# Patient Record
Sex: Female | Born: 1960 | Race: White | Hispanic: No | Marital: Married | Smoking: Former smoker
Health system: Southern US, Community
[De-identification: ages and names within clinical notes are randomized; demographics above are authoritative.]

## PROBLEM LIST (undated history)

## (undated) DIAGNOSIS — J45909 Unspecified asthma, uncomplicated: Secondary | ICD-10-CM

## (undated) DIAGNOSIS — J189 Pneumonia, unspecified organism: Secondary | ICD-10-CM

## (undated) DIAGNOSIS — I7300 Raynaud's syndrome without gangrene: Secondary | ICD-10-CM

## (undated) DIAGNOSIS — C4490 Unspecified malignant neoplasm of skin, unspecified: Secondary | ICD-10-CM

## (undated) DIAGNOSIS — C50919 Malignant neoplasm of unspecified site of unspecified female breast: Secondary | ICD-10-CM

## (undated) DIAGNOSIS — G629 Polyneuropathy, unspecified: Secondary | ICD-10-CM

## (undated) DIAGNOSIS — F419 Anxiety disorder, unspecified: Secondary | ICD-10-CM

## (undated) DIAGNOSIS — Z8679 Personal history of other diseases of the circulatory system: Secondary | ICD-10-CM

## (undated) DIAGNOSIS — N823 Fistula of vagina to large intestine: Secondary | ICD-10-CM

## (undated) DIAGNOSIS — M8580 Other specified disorders of bone density and structure, unspecified site: Secondary | ICD-10-CM

## (undated) DIAGNOSIS — Z923 Personal history of irradiation: Secondary | ICD-10-CM

## (undated) DIAGNOSIS — T4145XA Adverse effect of unspecified anesthetic, initial encounter: Secondary | ICD-10-CM

## (undated) DIAGNOSIS — Z9289 Personal history of other medical treatment: Secondary | ICD-10-CM

## (undated) DIAGNOSIS — I10 Essential (primary) hypertension: Secondary | ICD-10-CM

## (undated) HISTORY — PX: LAPAROSCOPIC ENDOMETRIOSIS FULGURATION: SUR769

## (undated) HISTORY — DX: Raynaud's syndrome without gangrene: I73.00

## (undated) HISTORY — DX: Personal history of other diseases of the circulatory system: Z86.79

## (undated) HISTORY — DX: Essential (primary) hypertension: I10

## (undated) HISTORY — DX: Unspecified malignant neoplasm of skin, unspecified: C44.90

## (undated) HISTORY — DX: Fistula of vagina to large intestine: N82.3

## (undated) HISTORY — DX: Other specified disorders of bone density and structure, unspecified site: M85.80

---

## 1986-08-27 DIAGNOSIS — Z8679 Personal history of other diseases of the circulatory system: Secondary | ICD-10-CM

## 1986-08-27 HISTORY — DX: Personal history of other diseases of the circulatory system: Z86.79

## 1989-08-27 HISTORY — PX: OTHER SURGICAL HISTORY: SHX169

## 2000-06-25 ENCOUNTER — Encounter: Admission: RE | Admit: 2000-06-25 | Discharge: 2000-06-25 | Payer: Self-pay | Admitting: Obstetrics and Gynecology

## 2000-06-25 ENCOUNTER — Encounter: Payer: Self-pay | Admitting: Obstetrics and Gynecology

## 2003-11-15 ENCOUNTER — Other Ambulatory Visit: Admission: RE | Admit: 2003-11-15 | Discharge: 2003-11-15 | Payer: Self-pay | Admitting: Obstetrics and Gynecology

## 2003-11-17 ENCOUNTER — Encounter: Admission: RE | Admit: 2003-11-17 | Discharge: 2003-11-17 | Payer: Self-pay | Admitting: Obstetrics and Gynecology

## 2005-05-17 ENCOUNTER — Ambulatory Visit (HOSPITAL_COMMUNITY): Admission: RE | Admit: 2005-05-17 | Discharge: 2005-05-17 | Payer: Self-pay | Admitting: Internal Medicine

## 2005-08-07 ENCOUNTER — Encounter: Admission: RE | Admit: 2005-08-07 | Discharge: 2005-08-07 | Payer: Self-pay | Admitting: Gastroenterology

## 2005-08-23 ENCOUNTER — Ambulatory Visit (HOSPITAL_COMMUNITY): Admission: RE | Admit: 2005-08-23 | Discharge: 2005-08-23 | Payer: Self-pay | Admitting: *Deleted

## 2005-08-27 DIAGNOSIS — T8859XA Other complications of anesthesia, initial encounter: Secondary | ICD-10-CM

## 2005-08-27 HISTORY — PX: ABDOMINAL HYSTERECTOMY: SHX81

## 2005-08-27 HISTORY — PX: HERNIA REPAIR: SHX51

## 2005-08-27 HISTORY — DX: Other complications of anesthesia, initial encounter: T88.59XA

## 2005-08-27 HISTORY — PX: APPENDECTOMY: SHX54

## 2006-10-17 ENCOUNTER — Encounter: Admission: RE | Admit: 2006-10-17 | Discharge: 2006-10-17 | Payer: Self-pay | Admitting: Gastroenterology

## 2006-11-06 ENCOUNTER — Ambulatory Visit (HOSPITAL_COMMUNITY): Admission: RE | Admit: 2006-11-06 | Discharge: 2006-11-06 | Payer: Self-pay | Admitting: Internal Medicine

## 2006-11-12 ENCOUNTER — Encounter: Admission: RE | Admit: 2006-11-12 | Discharge: 2006-11-12 | Payer: Self-pay | Admitting: Internal Medicine

## 2006-12-26 ENCOUNTER — Ambulatory Visit (HOSPITAL_BASED_OUTPATIENT_CLINIC_OR_DEPARTMENT_OTHER): Admission: RE | Admit: 2006-12-26 | Discharge: 2006-12-26 | Payer: Self-pay | Admitting: *Deleted

## 2006-12-29 ENCOUNTER — Ambulatory Visit: Payer: Self-pay | Admitting: Internal Medicine

## 2007-12-22 ENCOUNTER — Encounter: Admission: RE | Admit: 2007-12-22 | Discharge: 2007-12-22 | Payer: Self-pay | Admitting: Obstetrics

## 2008-05-20 ENCOUNTER — Encounter: Admission: RE | Admit: 2008-05-20 | Discharge: 2008-05-20 | Payer: Self-pay | Admitting: General Surgery

## 2011-01-12 NOTE — Procedures (Signed)
[name], Alison [name]                ACCOUNT NO.:  [id number]   MEDICAL RECORD NO.:  [id number]          PATIENT TYPE:  OUT   LOCATION:  SLEEP CENTER                 FACILITY:  [facility]   PHYSICIAN:  Clinton D. Maple Hudson, MD, FCCP, FACPDATE OF BIRTH:  June 19, 1960   DATE OF STUDY:  12/26/2006                            NOCTURNAL POLYSOMNOGRAM   REFERRING PHYSICIAN:  TERRY W KERSEY   INDICATION FOR STUDY:  Insomnia with sleep apnea.   EPWORTH SLEEPINESS SCORE:  6/24, height 5 feet 3 inches, weight 166  pounds, neck size 14.5 inches.   MEDICATIONS:  Home medications are listed and reviewed including  sertraline, bupropion XL, dextroamphetamine, Xanax, and trazodone likely  to affect alertness and sleep.   SLEEP ARCHITECTURE:  Total sleep time 319 minutes with sleep efficiency  80%.  Stage I was 3%, stage II 93%, stages III and IV absent, REM 4% of  total sleep time.  Sleep latency 14 minutes, REM latency 256 minutes,  awake after sleep onset 65 minutes.  Arousal index 6.0.  There was  spontaneous waking between 2:30 and 4 a.m., nonspecific.  Bedtime  medication included Xanax and 100 mg of trazodone at 9:45 p.m.   RESPIRATORY DATA:  Apnea/hypopnea index (AHI, RDI) 5.3 obstructive  events per hour indicating mild obstructive sleep apnea/hypopnea  syndrome (normal range 0-5 per hour).  There were 3 obstructive apneas  and 25 hypopneas.  Most events were recorded while sleeping on her back.  There were insufficient events to qualify for CPAP titration by split  protocol on this study night.   OXYGEN DATA:  Mild to moderate intermittent snoring, loud at times while  supine with oxygen desaturation to a nadir of 83'%.  Mean oxygen  saturation through the study was 92% on room air.   CARDIAC DATA:  Sinus rhythm including mild tachycardia to 115 beats per  minute.   MOVEMENT-PARASOMNIA:  Limb jerks were noted but with little effect on  sleep.   IMPRESSIONS-RECOMMENDATIONS:  1. Note bedtime  medications capable of increasing sedation including      Xanax and trazodone.  2. Minimal obstructive sleep apnea/hypopnea syndrome, AHI 5.3 per hour      with most obstructive events recorded while sleeping on her back.      Mild to moderate snoring was noted to be occasionally loud when she      was on her back.  Oxygen desaturation transiently to 83%.  3. Scores in this range are below what is usually considered      appropriate for CPAP therapy.  Weight loss,      encouragement to sleep off back, and treatment for any significant      nasal or upper airway obstruction may be helpful.      Clinton D. Maple Hudson, MD, [facility], FACP  Diplomate, Biomedical engineer of Sleep Medicine  Electronically Signed     CDY/MEDQ  D:  12/29/2006 11:38:03  T:  12/29/2006 17:46:59  Job:  [id number]

## 2012-05-19 ENCOUNTER — Other Ambulatory Visit: Payer: Self-pay | Admitting: Obstetrics

## 2012-05-19 DIAGNOSIS — Z1231 Encounter for screening mammogram for malignant neoplasm of breast: Secondary | ICD-10-CM

## 2012-06-04 ENCOUNTER — Ambulatory Visit
Admission: RE | Admit: 2012-06-04 | Discharge: 2012-06-04 | Disposition: A | Discharge status: Home / Self Care | Payer: PRIVATE HEALTH INSURANCE | Source: Ambulatory Visit | Attending: Obstetrics | Admitting: Obstetrics

## 2012-06-04 DIAGNOSIS — Z1231 Encounter for screening mammogram for malignant neoplasm of breast: Secondary | ICD-10-CM

## 2012-08-27 DIAGNOSIS — C4490 Unspecified malignant neoplasm of skin, unspecified: Secondary | ICD-10-CM

## 2012-08-27 HISTORY — DX: Unspecified malignant neoplasm of skin, unspecified: C44.90

## 2012-08-29 ENCOUNTER — Other Ambulatory Visit: Payer: Self-pay | Admitting: Obstetrics

## 2012-08-29 DIAGNOSIS — R109 Unspecified abdominal pain: Secondary | ICD-10-CM

## 2012-09-02 ENCOUNTER — Ambulatory Visit
Admission: RE | Admit: 2012-09-02 | Discharge: 2012-09-02 | Disposition: A | Discharge status: Home / Self Care | Payer: PRIVATE HEALTH INSURANCE | Source: Ambulatory Visit | Attending: Obstetrics | Admitting: Obstetrics

## 2012-09-02 DIAGNOSIS — R109 Unspecified abdominal pain: Secondary | ICD-10-CM

## 2012-09-02 MED ORDER — IOHEXOL 300 MG/ML  SOLN
100.0000 mL | Freq: Once | INTRAMUSCULAR | Status: AC | PRN
  Administered 2012-09-02: 100 mL via INTRAVENOUS

## 2012-09-24 ENCOUNTER — Ambulatory Visit (INDEPENDENT_AMBULATORY_CARE_PROVIDER_SITE_OTHER): Payer: Self-pay | Admitting: General Surgery

## 2012-12-02 HISTORY — PX: COLON RESECTION SIGMOID: SHX6737

## 2013-07-02 ENCOUNTER — Other Ambulatory Visit: Payer: Self-pay

## 2013-07-02 DIAGNOSIS — Z1231 Encounter for screening mammogram for malignant neoplasm of breast: Secondary | ICD-10-CM

## 2013-07-31 ENCOUNTER — Ambulatory Visit
Admission: RE | Admit: 2013-07-31 | Discharge: 2013-07-31 | Disposition: A | Discharge status: Home / Self Care | Payer: PRIVATE HEALTH INSURANCE | Source: Ambulatory Visit

## 2013-07-31 DIAGNOSIS — Z1231 Encounter for screening mammogram for malignant neoplasm of breast: Secondary | ICD-10-CM

## 2014-10-07 ENCOUNTER — Other Ambulatory Visit: Payer: Self-pay

## 2014-10-07 DIAGNOSIS — Z1231 Encounter for screening mammogram for malignant neoplasm of breast: Secondary | ICD-10-CM

## 2014-10-19 ENCOUNTER — Other Ambulatory Visit: Payer: Self-pay

## 2014-10-19 ENCOUNTER — Ambulatory Visit
Admission: RE | Admit: 2014-10-19 | Discharge: 2014-10-19 | Disposition: A | Discharge status: Home / Self Care | Payer: 59 | Source: Ambulatory Visit

## 2014-10-19 ENCOUNTER — Encounter (INDEPENDENT_AMBULATORY_CARE_PROVIDER_SITE_OTHER): Payer: Self-pay

## 2014-10-19 DIAGNOSIS — Z1231 Encounter for screening mammogram for malignant neoplasm of breast: Secondary | ICD-10-CM

## 2014-10-21 ENCOUNTER — Other Ambulatory Visit: Payer: Self-pay | Admitting: Obstetrics

## 2014-10-21 DIAGNOSIS — R928 Other abnormal and inconclusive findings on diagnostic imaging of breast: Secondary | ICD-10-CM

## 2014-10-29 ENCOUNTER — Other Ambulatory Visit: Payer: 59

## 2014-11-01 ENCOUNTER — Ambulatory Visit
Admission: RE | Admit: 2014-11-01 | Discharge: 2014-11-01 | Disposition: A | Discharge status: Home / Self Care | Payer: 59 | Source: Ambulatory Visit | Attending: Obstetrics | Admitting: Obstetrics

## 2014-11-01 DIAGNOSIS — R928 Other abnormal and inconclusive findings on diagnostic imaging of breast: Secondary | ICD-10-CM

## 2014-12-21 ENCOUNTER — Other Ambulatory Visit: Payer: Self-pay | Admitting: Obstetrics

## 2014-12-21 DIAGNOSIS — R109 Unspecified abdominal pain: Secondary | ICD-10-CM

## 2014-12-24 ENCOUNTER — Ambulatory Visit
Admission: RE | Admit: 2014-12-24 | Discharge: 2014-12-24 | Disposition: A | Discharge status: Home / Self Care | Payer: 59 | Source: Ambulatory Visit | Attending: Obstetrics | Admitting: Obstetrics

## 2014-12-24 DIAGNOSIS — R109 Unspecified abdominal pain: Secondary | ICD-10-CM

## 2014-12-24 MED ORDER — IOPAMIDOL (ISOVUE-300) INJECTION 61%
100.0000 mL | Freq: Once | INTRAVENOUS | Status: AC | PRN
  Administered 2014-12-24: 100 mL via INTRAVENOUS

## 2015-07-28 HISTORY — PX: OTHER SURGICAL HISTORY: SHX169

## 2015-08-01 ENCOUNTER — Other Ambulatory Visit: Payer: Self-pay | Admitting: Obstetrics

## 2015-08-01 ENCOUNTER — Ambulatory Visit
Admission: RE | Admit: 2015-08-01 | Discharge: 2015-08-01 | Disposition: A | Discharge status: Home / Self Care | Payer: 59 | Source: Ambulatory Visit | Attending: Obstetrics | Admitting: Obstetrics

## 2015-08-01 DIAGNOSIS — N6459 Other signs and symptoms in breast: Secondary | ICD-10-CM

## 2015-08-01 DIAGNOSIS — R2232 Localized swelling, mass and lump, left upper limb: Secondary | ICD-10-CM

## 2015-08-01 DIAGNOSIS — N6320 Unspecified lump in the left breast, unspecified quadrant: Secondary | ICD-10-CM

## 2015-08-03 ENCOUNTER — Other Ambulatory Visit: Payer: Self-pay | Admitting: Obstetrics

## 2015-08-03 DIAGNOSIS — N6320 Unspecified lump in the left breast, unspecified quadrant: Secondary | ICD-10-CM

## 2015-08-04 ENCOUNTER — Ambulatory Visit
Admission: RE | Admit: 2015-08-04 | Discharge: 2015-08-04 | Disposition: A | Discharge status: Home / Self Care | Payer: 59 | Source: Ambulatory Visit | Attending: Obstetrics | Admitting: Obstetrics

## 2015-08-04 ENCOUNTER — Other Ambulatory Visit: Payer: Self-pay | Admitting: Obstetrics

## 2015-08-04 DIAGNOSIS — N6320 Unspecified lump in the left breast, unspecified quadrant: Secondary | ICD-10-CM

## 2015-08-04 DIAGNOSIS — R2232 Localized swelling, mass and lump, left upper limb: Secondary | ICD-10-CM

## 2015-08-05 ENCOUNTER — Other Ambulatory Visit: Payer: Self-pay | Admitting: Obstetrics

## 2015-08-05 DIAGNOSIS — N63 Unspecified lump in unspecified breast: Secondary | ICD-10-CM

## 2015-08-08 ENCOUNTER — Telehealth: Payer: Self-pay | Admitting: *Deleted

## 2015-08-08 DIAGNOSIS — C50412 Malignant neoplasm of upper-outer quadrant of left female breast: Secondary | ICD-10-CM

## 2015-08-08 DIAGNOSIS — Z170 Estrogen receptor positive status [ER+]: Secondary | ICD-10-CM | POA: Insufficient documentation

## 2015-08-08 NOTE — Telephone Encounter (Signed)
Confirmed [facility] for 08/10/15 at 830am .  Instructions and contact information given.

## 2015-08-09 ENCOUNTER — Ambulatory Visit
Admission: RE | Admit: 2015-08-09 | Discharge: 2015-08-09 | Disposition: A | Discharge status: Home / Self Care | Payer: 59 | Source: Ambulatory Visit | Attending: Obstetrics | Admitting: Obstetrics

## 2015-08-09 ENCOUNTER — Other Ambulatory Visit: Payer: Self-pay | Admitting: Obstetrics

## 2015-08-09 DIAGNOSIS — N63 Unspecified lump in unspecified breast: Secondary | ICD-10-CM

## 2015-08-10 ENCOUNTER — Encounter: Payer: Self-pay | Admitting: Oncology

## 2015-08-10 ENCOUNTER — Encounter: Payer: Self-pay | Admitting: Physical Therapy

## 2015-08-10 ENCOUNTER — Other Ambulatory Visit (HOSPITAL_BASED_OUTPATIENT_CLINIC_OR_DEPARTMENT_OTHER): Payer: 59

## 2015-08-10 ENCOUNTER — Other Ambulatory Visit (HOSPITAL_COMMUNITY): Payer: Self-pay | Admitting: Surgery

## 2015-08-10 ENCOUNTER — Encounter: Payer: Self-pay | Admitting: Skilled Nursing Facility1

## 2015-08-10 ENCOUNTER — Ambulatory Visit
Admission: RE | Admit: 2015-08-10 | Discharge: 2015-08-10 | Disposition: A | Discharge status: Home / Self Care | Payer: 59 | Source: Ambulatory Visit | Attending: Radiation Oncology | Admitting: Radiation Oncology

## 2015-08-10 ENCOUNTER — Ambulatory Visit: Payer: 59 | Attending: Surgery | Admitting: Physical Therapy

## 2015-08-10 ENCOUNTER — Telehealth: Payer: Self-pay | Admitting: Oncology

## 2015-08-10 ENCOUNTER — Ambulatory Visit (HOSPITAL_BASED_OUTPATIENT_CLINIC_OR_DEPARTMENT_OTHER): Payer: 59 | Admitting: Oncology

## 2015-08-10 ENCOUNTER — Other Ambulatory Visit: Payer: Self-pay | Admitting: *Deleted

## 2015-08-10 VITALS — BP 121/69 | HR 93 | Temp 98.6°F | Resp 18 | Ht 63.0 in | Wt 145.8 lb

## 2015-08-10 DIAGNOSIS — C50412 Malignant neoplasm of upper-outer quadrant of left female breast: Secondary | ICD-10-CM

## 2015-08-10 DIAGNOSIS — C50912 Malignant neoplasm of unspecified site of left female breast: Secondary | ICD-10-CM

## 2015-08-10 DIAGNOSIS — R293 Abnormal posture: Secondary | ICD-10-CM | POA: Diagnosis present

## 2015-08-10 DIAGNOSIS — Z170 Estrogen receptor positive status [ER+]: Secondary | ICD-10-CM

## 2015-08-10 LAB — COMPREHENSIVE METABOLIC PANEL
ALBUMIN: 4.1 g/dL (ref 3.5–5.0)
ALT: 44 U/L (ref 0–55)
AST: 43 U/L — AB (ref 5–34)
Alkaline Phosphatase: 37 U/L — ABNORMAL LOW (ref 40–150)
Anion Gap: 10 mEq/L (ref 3–11)
BUN: 23.1 mg/dL (ref 7.0–26.0)
CHLORIDE: 106 meq/L (ref 98–109)
CO2: 21 mEq/L — ABNORMAL LOW (ref 22–29)
Calcium: 9.8 mg/dL (ref 8.4–10.4)
Creatinine: 0.9 mg/dL (ref 0.6–1.1)
EGFR: 68 mL/min/{1.73_m2} — ABNORMAL LOW (ref >90)
GLUCOSE: 104 mg/dL (ref 70–140)
POTASSIUM: 4.4 meq/L (ref 3.5–5.1)
Sodium: 137 mEq/L (ref 136–145)
Total Bilirubin: 0.35 mg/dL (ref 0.20–1.20)
Total Protein: 7.7 g/dL (ref 6.4–8.3)

## 2015-08-10 LAB — CBC WITH DIFFERENTIAL/PLATELET
BASO%: 0.2 % (ref 0.0–2.0)
Basophils Absolute: 0 10*3/uL (ref 0.0–0.1)
EOS ABS: 0.2 10*3/uL (ref 0.0–0.5)
EOS%: 2.8 % (ref 0.0–7.0)
HEMATOCRIT: 36.7 % (ref 34.8–46.6)
HGB: 12.3 g/dL (ref 11.6–15.9)
LYMPH#: 1.7 10*3/uL (ref 0.9–3.3)
LYMPH%: 28.6 % (ref 14.0–49.7)
MCH: 31.3 pg (ref 25.1–34.0)
MCHC: 33.4 g/dL (ref 31.5–36.0)
MCV: 93.7 fL (ref 79.5–101.0)
MONO#: 0.5 10*3/uL (ref 0.1–0.9)
MONO%: 9.1 % (ref 0.0–14.0)
NEUT%: 59.3 % (ref 38.4–76.8)
NEUTROS ABS: 3.6 10*3/uL (ref 1.5–6.5)
PLATELETS: 340 10*3/uL (ref 145–400)
RBC: 3.92 10*6/uL (ref 3.70–5.45)
RDW: 13.3 % (ref 11.2–14.5)
WBC: 6 10*3/uL (ref 3.9–10.3)

## 2015-08-10 MED ORDER — ALPRAZOLAM 0.5 MG PO TABS: 0.5000 mg | ORAL_TABLET | ORAL | Status: DC | PRN

## 2015-08-10 MED ORDER — GABAPENTIN 300 MG PO CAPS: 300.0000 mg | ORAL_CAPSULE | Freq: Every day | ORAL | Status: DC

## 2015-08-10 NOTE — Progress Notes (Signed)
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  HISTORY OF PRESENT ILLNESS::Alison [name] is a 54 y.o. female who felt thickening and hardness as well as swelling underneath her left arm for two weeks. Mammogram showed increased density of the left breast with ill defined edema and skin thickening. Two discrete masses were seen on ultrasound, the largest measuring 3.2 cm. These were about 1.8 cm apart. Several enlarged left axillary lymph nodes were seen. Biopsy of the breast region showed Grade III invasive ductal carcinoma with DCIS, ER+ PR+ HER-2(-) Ki-67 12%. Her left axillary lymph node is positive. She is currently on estrogen replacement.    Alison [name] is accompanied by her husband today. She noticed the abnormal hardening of her left breast on Saturday morning, Sunday morning she noticed her nipple inverting with pain, and called her gynecologist the following Monday morning. She had not been feeling well before then and was taking naps which is not her normal. She initially believed the swollen lymph node was perhaps from a flu coming on. Her nipple has not inverted any more and the skin in her breast continues to feel abnormal. She has been experiencing intermittent pain in her left arm that began on Saturday, before her biopsies. She notes bruising from her biopsy. She had her lymph node biopsy and reports some weeping/drainage. She previously had basal cell carcinoma in one spot in 2014. Her father had lung cancer. She has no family history of any breast associated cancers. She reports that her right nipple has been sensitive/sore. She also gets a boil in her right axillary region off and on since she turn 54 years old. She was curious if  treatment would be completed by July 8th of 2017 as they have a two week beach trip planned.   PREVIOUS RADIATION THERAPY: No  Past medical, social and family history were reviewed in the electronic chart. Review of symptoms was reviewed in the electronic chart. Medications were reviewed in the electronic chart.   PHYSICAL EXAM:  Vitals with BMI 08/10/2015  Height 5' 3"   Weight 145 lbs 13 oz  BMI 37.3  Systolic 428  Diastolic 69  Pulse 93  Respirations 18   Bilateral supraclavicular adenopathy. She has a palpable cyst in the right axilla with no palpable abnormalities of the right breast. The left breast is firm and hard. It measures about twice the size of the right breast. She has a palpable lymph node which is tender on palpation in the left axilla. She is alert and oriented x3. There is bruising associated with the biopsy site in the upper outer quadrant of the left breast. Nipple inverted on the left breast.   Gynecologic History  Age at first menstrual period? 10 years  Are you still having periods? No Approximate date of last period? N/A  If you are still having periods: Are your periods regular? N/A  If you no longer have periods: Have you used hormone replacement? Yes  If YES, for how long? 2012? When did you stop? Doing step down process now. Obstetric History:  How many children have you carried to term? 1 Your age at first live birth? 65 years  Pregnant now or trying to get pregnant? No  Have you used  birth control pills or hormone shots for contraception? Yes  If so, for how long (or approximate dates)? 1-2 month - 1981  Would you be interested in learning more about the options to preserve fertility? N/A Health Maintenance:  Have you ever had a colonoscopy? Yes If yes, date? April 2014  Have you ever had a bone density? Yes If yes, date? August 2016  Date of your last PAP smear? 2013? Date of your FIRST mammogram? 73?  IMPRESSION: T2 N1 invasive ductal carcinoma of  the left breast.  PLAN: Ms. [name] will proceed with MRI and PET or CT and bone scan for staging and to document the extent of disease in her breasts. She may also proceed with punch biopsy to document skin involvement with Dr. Brantley Stage. I think at this point she will likely require a mastectomy regardless of her response to chemotherapy and because of her positive lymph node would require post mastectomy radiation. We discussed 6 weeks of treatment as an outpatient. We discussed skin redness and fatigue as possible side effects. We will have to see what happens with neo adjuvant chemotherapy and staging studies and go from there. She was seen by surgery and medical oncology, physical therapy, a member of our social work team and survivorship.   I spent 40 minutes  face to face with the patient and more than 50% of that time was spent in counseling and/or coordination of care.   ------------------------------------------------  [name], MD  This document serves as a record of services personally performed by [name], MD. It was created on her behalf by Arlyce Harman, a trained medical scribe. The creation of this record is based on the scribe's personal observations and the provider's statements to them. This document has been checked and approved by the attending provider.

## 2015-08-10 NOTE — Therapy (Signed)
[place], [place], [address] Phone: [phone]   Fax:  [phone]  Physical Therapy Evaluation  Patient Details  Name: Alison [name] MRN: [id number] Date of Birth: [birth year] Referring Provider: Dr. Erroll Luna  Encounter Date: 08/10/2015      PT End of Session - 08/10/15 2102    Visit Number 1   Number of Visits 1   PT Start Time 3151   PT Stop Time 7616  Also saw pt from 0737 - 1128   PT Time Calculation (min) 10 min   Activity Tolerance Patient tolerated treatment well   Behavior During Therapy [facility] for tasks assessed/performed      Past Medical History  Diagnosis Date  . Hypertension   . Raynaud's disease   . Skin cancer     Past Surgical History  Procedure Laterality Date  . C-section    . Abdominal hysterectomy    . Appendectomy    . Colon resection    . Hernia repair      There were no vitals filed for this visit.  Visit Diagnosis:  Carcinoma of upper-outer quadrant of left female breast [facility]) - Plan: PT plan of care cert/re-cert  Abnormal posture - Plan: PT plan of care cert/re-cert      Subjective Assessment - 08/10/15 2054    Subjective Patient was seen today for a baseline assessment of her newly diagnosed left breast cancer.   Patient is accompained by: Family member   Pertinent History Patient was diagnosed 08/05/15 with left invasive breast cancer.  It is ER/PR positive and HER2 negative.  There are 2 masses measuring 2.3 and 1 cm located at 2:00 and 1:00 respectively.  An axillary node was biopsied and is positive.   Patient Stated Goals Reduce lymphedema risk and learn post op shoulder ROM HEP   Currently in Pain? No/denies            [facility] PT Assessment - 08/10/15 0001    Assessment   Medical Diagnosis left breast cancer   Referring Provider Dr. Marcello Moores [place]   Onset Date/Surgical Date 08/05/15   Hand Dominance Right   Prior Therapy none   Precautions   Precautions Other (comment)  Active breast cancer   Restrictions   Weight Bearing Restrictions No   Balance Screen   Has the patient fallen in the past 6 months No   Has the patient had a decrease in activity level because of a fear of falling?  No   Is the patient reluctant to leave their home because of a fear of falling?  No   Home Ecologist residence   Living Arrangements Spouse/significant other   Available Help at Discharge Family   Prior Function   Level of [place] Retired   Biomedical scientist Retired Electrical engineer   Leisure She does not exercise   Cognition   Overall Cognitive Status Within Functional Limits for tasks assessed   Posture/Postural Control   Posture/Postural Control Postural limitations   Postural Limitations Rounded Shoulders;Forward head   ROM / Strength   AROM / PROM / Strength AROM;Strength   AROM   AROM Assessment Site Shoulder   Right/Left Shoulder Right;Left   Right Shoulder Extension 34 Degrees   Right Shoulder Flexion 156 Degrees   Right Shoulder ABduction 170 Degrees   Right Shoulder Internal Rotation 68 Degrees   Right Shoulder External Rotation 90 Degrees   Left Shoulder Extension  46 Degrees   Left Shoulder Flexion 156 Degrees   Left Shoulder ABduction 166 Degrees   Left Shoulder Internal Rotation 76 Degrees   Left Shoulder External Rotation 90 Degrees   Strength   Overall Strength Within functional limits for tasks performed           LYMPHEDEMA/ONCOLOGY QUESTIONNAIRE - 08/10/15 2100    Type   Cancer Type Left breast cancer   Lymphedema Assessments   Lymphedema Assessments Upper extremities   Right Upper Extremity Lymphedema   10 cm Proximal to Olecranon Process 25 cm   Olecranon Process 23 cm   10 cm Proximal to Ulnar Styloid Process 22.3 cm   Just Proximal to Ulnar Styloid Process 15 cm   Across Hand at [employer] 18.5 cm   At [place] of 2nd  Digit 5.7 cm   Left Upper Extremity Lymphedema   10 cm Proximal to Olecranon Process 25.3 cm   Olecranon Process 22.7 cm   10 cm Proximal to Ulnar Styloid Process 20.9 cm   Just Proximal to Ulnar Styloid Process 14.9 cm   Across Hand at [employer] 17.9 cm   At [place] of 2nd Digit 5.5 cm      Patient was instructed today in a home exercise program today for post op shoulder range of motion. These included active assist shoulder flexion in sitting, scapular retraction, wall walking with shoulder abduction, and hands behind head external rotation.  She was encouraged to do these twice a day, holding 3 seconds and repeating 5 times when permitted by her physician.         PT Education - 08/10/15 2101    Education provided Yes   Education Details Post op shoulder ROM HEP and lymphedema risk reduction   Person(s) Educated Patient;Spouse   Methods Demonstration;Explanation;Handout   Comprehension Returned demonstration;Verbalized understanding              Breast Clinic Goals - 08/10/15 2105    Patient will be able to verbalize understanding of pertinent lymphedema risk reduction practices relevant to her diagnosis specifically related to skin care.   Time 1   Period Days   Status Achieved   Patient will be able to return demonstrate and/or verbalize understanding of the post-op home exercise program related to regaining shoulder range of motion.   Time 1   Period Days   Status Achieved   Patient will be able to verbalize understanding of the importance of attending the postoperative After Breast Cancer Class for further lymphedema risk reduction education and therapeutic exercise.   Time 1   Period Days   Status Achieved              Plan - 08/10/15 2103    Clinical Impression Statement Patient was diagnosed 08/05/15 with left invasive breast cancer.  It is ER/PR positive and HER2 negative.  There are 2 masses measuring 2.3 and 1 cm located at 2:00 and 1:00  respectively.  An axillary node was biopsied and is positive.  She is planning to have neoadjuvant chemotherapy, a left lumpectomy with either a sentinel node biopsy or axillary node dissection followed by radiation.  She may benefit from post op PT to regain shoulder ROM and strength and to reduce lymphedema risk.   Pt will benefit from skilled therapeutic intervention in order to improve on the following deficits Decreased strength;Decreased knowledge of precautions;Pain;Impaired UE functional use;Decreased range of motion   Rehab Potential Excellent   Clinical Impairments Affecting Rehab  Potential none   PT Frequency One time visit   PT Treatment/Interventions Therapeutic exercise;Patient/family education   PT Next Visit Plan No PT indicated until after surgery   Consulted and Agree with Plan of Care Patient;Family member/caregiver   Family Member Consulted Husband     Patient will follow up at outpatient cancer rehab if needed following surgery.  If the patient requires physical therapy at that time, a specific plan will be dictated and sent to the referring physician for approval. The patient was educated today on appropriate basic range of motion exercises to begin post operatively and the importance of attending the After Breast Cancer class following surgery.  Patient was educated today on lymphedema risk reduction practices as it pertains to recommendations that will benefit the patient immediately following surgery.  She verbalized good understanding.  No additional physical therapy is indicated at this time.       Problem List Patient Active Problem List   Diagnosis Date Noted  . Breast cancer of upper-outer quadrant of left female breast ([place]) 08/08/2015    Annia Friendly, PT 08/10/2015 9:07 PM  [place], [place], [address] Phone: [phone]   Fax:  [phone]  Name: Alison [name] MRN:  [id number] Date of Birth: 1960/09/27

## 2015-08-10 NOTE — Patient Instructions (Signed)

## 2015-08-10 NOTE — Progress Notes (Signed)
Subjective:     Patient ID: Alison [name], female   DOB: 28-Dec-1960, 54 y.o.   MRN: [id number]  HPI   Review of Systems     Objective:   Physical Exam For the patient to understand and be given the tools to implement a healthy plant based diet during their cancer diagnosis. pts ht 5'3'', 145 pounds, and BMI 25.9     Assessment:     Patient was seen today and found to be in good spirits and accompanied by her husband. Pt asked a lot of really great questions. Pts ht 5'3'', BMI 25.9, wt 145. Pt is taking a probiotic capsule. PTs taking: vitamin E, C,D, calcium, and a mutlivitamin. Pt has had a colon resection. Pts labs: AST 43, GFR 68, CO2 21. Pt states she is always up and doing something.     Plan:     Dietitian educated the patient on implementing a plant based diet by incorporating more plant proteins, fruits, and vegetables. As a part of a healthy routine physical activity was discussed. Dietitian advised she focus on a bowel healthy diet instead of miralax. The importance of legitimate, evidence based information was discussed and examples were given. A folder of evidence based information with a focus on a plant based diet and general nutrition during cancer was given to the patient.  As a part of the continuum of care the cancer dietitian's contact information was given to the patient in the event they would like to have a follow up appointment.

## 2015-08-10 NOTE — Telephone Encounter (Signed)
GAVE PATIENT AVS REPORT AND APPOINTMENTS FOR December AND January INCLUDING ECHO. BONE/CT SCAN AND MRI ALREADY ON SCHEDULE. PER REPLY FROM [facility] NO PREAUTH NEEDED FOR ECHO.

## 2015-08-10 NOTE — Progress Notes (Signed)
[place]  [phone] [phone]     ID: Alison [name] DOB: 1960/09/17  MR#: [id number]  KGY#:[id number]  Patient Care Team: [name], MD as PCP - General (Internal Medicine) Erroll Luna, MD as Consulting Physician (General Surgery) Chauncey Cruel, MD as Consulting Physician (Oncology) [name], MD as Consulting Physician (Radiation Oncology) Aloha Gell, MD as Consulting Physician (Obstetrics and Gynecology) Harriett Sine, MD as Consulting Physician (Dermatology) PCP: [name], MD OTHER MD:  CHIEF COMPLAINT: Estrogen receptor positive breast cancer  CURRENT TREATMENT: Neoadjuvant chemotherapy   BREAST CANCER HISTORY: Alison [name] had routine screening mammography [facility] February 20 11/14/2014 showing a possible mass in the right breast. Diagnostic right mammography with right breast ultrasonography 11/01/2014 found the breast density to be category C. In the upper outer quadrant of the right breast there was a partially obscured mass which on physical exam was palpable as an area of thickening at the 10:00 position 8 cm from the nipple. Ultrasound showed multiple cysts in the upper outer right breast corresponding to the mass in question.  In November 2016 the patient felt a change in the upper left breast and brought it to her primary care physician's attention. Diagnostic left mammography with tomosynthesis and left breast ultrasonography at [facility] December 01/14/2015 found trabecular thickening throughout the left breast with skin thickening, but no circumscribed mass or suspicious calcifications. A rounded mass in the left axilla measured 6 mm. There was an additional mildly prominent lymph node in the left axilla which was what the patient had been palpating. Ultrasonography showed ill-defined swelling throughout the inner left breast with overlying skin thickening.. At the 2:00  position 4 cm from the nipple there was an irregular hypoechoic mass measuring 2.3 cm. A second mass at the 1:00 area 3 cm from the nipple measured 1 cm. The distance between these 2 masses was 1.8 cm. There was also a round hypoechoic left axillary mass measuring 0.6 cm. Overall the was an area of 3.7 cm of mixed echogenicity corresponding to the area of palpable soft tissue swelling.   On 08/04/2015 the patient underwent biopsy of the mass at the 2:00 axis 4 cm from the nipple and a second biopsy was performed of the hypoechoic mass at the 1:00 position 3 cm from the nipple. Biopsy of the suspicious nodule in the lower left axilla was attempted but could not be completed as the mass became obscured after administration of lidocaine.  The pathology from both left breast biopsies 08/04/2015 ([id number]) showed invasive ductal carcinoma, grade 3. The prognostic profile obtained from the larger tumor showed estrogen receptor to be strongly positive at 95%, progesterone receptor positive at 30%, with an MIB-1 of 12%, and no HER-2 amplification, the signals ratio being 1.22 and the number per cell 2.20.  On 08/09/2015 the patient underwent biopsy of this suspicious left axillary lymph node noted above, and this showed ([id number]) metastatic carcinoma with extracapsular extension.  The patient's subsequent history is as detailed below  INTERVAL HISTORY: Alison [name] was evaluated in the multidisciplinary breast cancer clinic 08/10/2015 accompanied by her husband Alison [name].  Her case was also presented in the multidisciplinary breast cancer conference that same morning. At that time a preliminary plan was proposed: Neoadjuvant chemotherapy followed if possible by breast conserving therapy, and participation in the Alliance trial, with adjuvant radiation followed by antiestrogen therapy. It was felt also the patient's tumor warranted full staging studies which have  been ordered.   REVIEW OF SYSTEMS:  Alison [name] has a  history of diverticular disease and is status post rectovaginal fistula repair in 2014. A CT scan of the abdomen 12/24/2014 found some thickening at the region of the rectosigmoid anastomosis but no evidence of recurrence of the fistula. The patient complains of chills, insomnia, fatigue, pain currently in her left breast, status post recent biopsies, history of ringing in her years, and irregular heartbeat, Raynaud phenomenon, history of basal cell carcinoma in 2014, history of headaches dating back to the summer of 2016, anxiety, and occasional "swollen glands" in her neck. There have been no visual changes, nausea, vomiting, dizziness, or gait imbalance. There has been no cough, phlegm production, or pleurisy. Has been no change in bowel or bladder habits. A detailed review of systems today was otherwise noncontributory   PAST MEDICAL HISTORY: Past Medical History  Diagnosis Date  . Hypertension   . Raynaud's disease   . Skin cancer   . Rectovaginal fistula     repaired 2014  . Osteopenia determined by x-ray     PAST SURGICAL HISTORY: Past Surgical History  Procedure Laterality Date  . C-section    . Abdominal hysterectomy      with BSO  . Appendectomy    . Colon resection    . Hernia repair      FAMILY HISTORY Family History  Problem Relation Age of Onset  . Lung cancer Father    the patient's father died from lung cancer at the age of 43 in the setting of tobacco abuse. The patient's mother died at the age of 55 with pancreatic cancer. The patient had no siblings. There is no history of breast or ovarian cancer in the family.  GYNECOLOGIC HISTORY:  No LMP recorded. Patient has had a hysterectomy. Menarche age 89, first live birth age 47. The patient is GX P1. She is status post total hysterectomy with bilateral salpingo-oophorectomy. She has been on hormone replacement since that time and is tapering to off at the time of this dictation.   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 She used to work at [employer]. Her husband Alison [name] works for [employer] division at a [employer] their son [name] is currently at home.    ADVANCED DIRECTIVES: Not in place   HEALTH MAINTENANCE: Social History  Substance Use Topics  . Smoking status: Former Smoker -- 1.00 packs/day    Types: Cigarettes    Quit date: 11/25/2008  . Smokeless tobacco: None  . Alcohol Use: No     Colonoscopy: April 2014/ Raynaldo Opitz at [facility]  PAP: s/p hysterectomy  Bone density: August 2016/ osteopenia  Lipid panel:  Allergies  Allergen Reactions  . Other Other (See Comments)    Other Reaction: Blisters in mouth    Current Outpatient Prescriptions  Medication Sig Dispense Refill  . Cholecalciferol (VITAMIN D3) 2000 UNITS capsule Take by mouth.    . dextroamphetamine (DEXEDRINE SPANSULE) 15 MG 24 hr capsule Take by mouth.    . fenofibrate (TRICOR) 145 MG tablet Take by mouth.    Marland Kitchen ibuprofen (ADVIL,MOTRIN) 200 MG tablet Take by mouth.    . Multiple Vitamin (MULTIVITAMIN) tablet Take 1 tablet by mouth daily.    Marland Kitchen NIFEdipine (PROCARDIA-XL/ADALAT-CC/NIFEDICAL-XL) 30 MG 24 hr tablet Take by mouth.    . olmesartan (BENICAR) 20 MG tablet Take by mouth.    . Probiotic Product (PROBIOTIC DAILY PO) Take by mouth.    . ALPRAZolam Duanne Moron)  0.5 MG tablet Take 1 tablet (0.5 mg total) by mouth as needed for anxiety (take 1 tab 1 hour prior to MRI. Take 1 tab prior to start of MRI). 3 tablet 0  . gabapentin (NEURONTIN) 300 MG capsule Take 1 capsule (300 mg total) by mouth at bedtime. 90 capsule 4   No current facility-administered medications for this visit.    OBJECTIVE: Middle-aged white woman who appears stated age  54 Vitals:   08/10/15 0924  BP: 121/69  Pulse: 93  Temp: 98.6 F (37 C)  Resp: 18     Body mass index is 25.83 kg/(m^2).    ECOG FS:1 - Symptomatic but completely ambulatory  Ocular: Sclerae unicteric, pupils equal, round and reactive to  light Ear-nose-throat: Oropharynx clear and moist Lymphatic: No cervical or supraclavicular adenopathy Lungs no rales or rhonchi, good excursion bilaterally Heart regular rate and rhythm, no murmur appreciated Abd soft, nontender, positive bowel sounds MSK no focal spinal tenderness, no joint edema Neuro: non-focal, well-oriented, appropriate affect Breasts: The right breast shows the irregular mass in the right axillary area which by prior ultrasonography is known to be a cluster of cysts.. The left breast is status post recent biopsy. There is no erythema. The whole breast is much firmer than the right, however, and slightly larger as well. There is minimal nipple inversion. There is a palpable mass in the left axilla which is movable and minimally tender, status post recent biopsy as well.   LAB RESULTS:  CMP     Component Value Date/Time   NA 137 08/10/2015 0854   K 4.4 08/10/2015 0854   CO2 21* 08/10/2015 0854   GLUCOSE 104 08/10/2015 0854   BUN 23.1 08/10/2015 0854   CREATININE 0.9 08/10/2015 0854   CALCIUM 9.8 08/10/2015 0854   PROT 7.7 08/10/2015 0854   ALBUMIN 4.1 08/10/2015 0854   AST 43* 08/10/2015 0854   ALT 44 08/10/2015 0854   ALKPHOS 37* 08/10/2015 0854   BILITOT 0.35 08/10/2015 0854    INo results found for: SPEP, UPEP  Lab Results  Component Value Date   WBC 6.0 08/10/2015   NEUTROABS 3.6 08/10/2015   HGB 12.3 08/10/2015   HCT 36.7 08/10/2015   MCV 93.7 08/10/2015   PLT 340 08/10/2015      Chemistry      Component Value Date/Time   NA 137 08/10/2015 0854   K 4.4 08/10/2015 0854   CO2 21* 08/10/2015 0854   BUN 23.1 08/10/2015 0854   CREATININE 0.9 08/10/2015 0854      Component Value Date/Time   CALCIUM 9.8 08/10/2015 0854   ALKPHOS 37* 08/10/2015 0854   AST 43* 08/10/2015 0854   ALT 44 08/10/2015 0854   BILITOT 0.35 08/10/2015 0854       No results found for: LABCA2  No components found for: [id number]  No results for input(s): INR in  the last 168 hours.  Urinalysis No results found for: COLORURINE, APPEARANCEUR, LABSPEC, PHURINE, GLUCOSEU, HGBUR, BILIRUBINUR, KETONESUR, PROTEINUR, UROBILINOGEN, NITRITE, LEUKOCYTESUR  STUDIES: Ct Chest W Contrast  08/12/2015  CLINICAL DATA:  55 year old female with newly diagnosed left-sided breast cancer on 08/10/2015. EXAM: CT CHEST WITH CONTRAST TECHNIQUE: Multidetector CT imaging of the chest was performed during intravenous contrast administration. CONTRAST:  10m OMNIPAQUE IOHEXOL 300 MG/ML  SOLN COMPARISON:  Chest CT 11/12/2006. FINDINGS: Mediastinum/Lymph Nodes: Heart size is normal. There is no significant pericardial fluid, thickening or pericardial calcification. There is atherosclerosis of the thoracic aorta, the great vessels of  the mediastinum and the coronary arteries, including calcified atherosclerotic plaque in the distal left main and proximal left anterior descending coronary arteries. No pathologically enlarged mediastinal, internal mammary or hilar lymph nodes. Esophagus is unremarkable in appearance. No axillary lymphadenopathy. Lungs/Pleura: No suspicious appearing pulmonary nodules or masses. No acute consolidative airspace disease. No pleural effusions. Upper Abdomen: Bilateral adrenal glands are visualized and are normal in appearance. Other structures are unremarkable. Musculoskeletal/Soft Tissues: Asymmetric skin thickening in the left breast. No discrete left breast mass is confidently identified on today's CT examination. Chronic compression fracture of L1 with approximately 15% loss of anterior vertebral body height, unchanged compared to 2008. There are no aggressive appearing lytic or blastic lesions noted in the visualized portions of the skeleton. IMPRESSION: 1. Asymmetric skin thickening in the left breast. No discrete breast mass or left axillary adenopathy is identified on today's CT examination. 2. No findings to suggest metastatic disease in the thorax. 3.  Atherosclerosis, including left main and left anterior descending coronary artery disease. Please note that although the presence of coronary artery calcium documents the presence of coronary artery disease, the severity of this disease and any potential stenosis cannot be assessed on this non-gated CT examination. Assessment for potential risk factor modification, dietary therapy or pharmacologic therapy may be warranted, if clinically indicated. Electronically Signed   By: Vinnie Langton M.D.   On: 08/12/2015 09:32   Mm Digital Diagnostic Unilat L  08/04/2015  CLINICAL DATA:  Status post ultrasound-guided biopsy of 2 suspicious masses in the left breast. EXAM: DIAGNOSTIC LEFT MAMMOGRAM POST ULTRASOUND BIOPSY COMPARISON:  Previous exam(s). FINDINGS: Mammographic images were obtained following ultrasound guided biopsy of suspicious masses at the 1 o'clock axis and 2 o'clock axis of the left breast. At the conclusion of each biopsy, tissue markers were placed at each biopsy site. Both of these clips appeared well-positioned by ultrasound and are adequately positioned on this postprocedure mammogram. IMPRESSION: Postprocedure mammogram for clip placement. Two biopsy clips are well-positioned at their respective biopsy sites. Final Assessment: Post Procedure Mammograms for Marker Placement Electronically Signed   By: Franki Cabot M.D.   On: 08/04/2015 16:13   US Breast Ltd Uni Left Inc Axilla  08/01/2015  CLINICAL DATA:  Patient describes thickening/hardness within the upper left breast, noticed only recently. Patient also describes swelling in the left axilla. Ordering physician describes left breast skin thickening and palpable mass in the upper outer quadrant. EXAM: DIGITAL DIAGNOSTIC LEFT MAMMOGRAM WITH 3D TOMOSYNTHESIS WITH CAD ULTRASOUND LEFT BREAST COMPARISON:  Previous exams including most recent screening mammogram of 10/19/2014 ACR Breast Density Category c: The breast tissue is heterogeneously  dense, which may obscure small masses. FINDINGS: Left breast CC and MLO projections were obtained with 3D tomosynthesis. Additional spot compression tangential views of the left axilla, corresponding to the area of soft tissue swelling, was obtained with overlying skin marker in place. There is trabecular thickening throughout the left breast and skin thickening. No circumscribed mass or suspicious calcifications identified within the left breast. A round mass within the left axilla measures 6 mm greatest dimension. There is an additional mildly prominent lymph node within the left axilla with surrounding trabecular thickening/edema, with overlying skin marker demonstrating this to be the site of patient's swelling. Mammographic images were processed with CAD. Targeted ultrasound is performed, showing ill-defined edema throughout the inner left breast and overlying skin thickening. There is also vague shadowing throughout the upper left breast. There is an irregular hypoechoic area at the 2 o'clock  axis, 4 cm from the nipple, with internal vascularity, measuring 2.3 x 1.4 x 0.8 cm. There is an additional hypoechoic mass within the left breast at the 1 o'clock axis, 3 cm from the nipple, with indistinct margins and anti-parallel orientation, measuring 1 x 0.8 x 1 cm. There is a round hypoechoic mass within the left axilla, measuring 0.6 x 0.4 x 0.5 cm. There is an additional morphologically normal lymph node within the left axilla, however, the surrounding soft tissues are heterogeneous (mixed-echogenicity), measuring 3.7 x 1.2 x 3.2 cm, corresponding to the area of palpable soft tissue swelling and mammographic finding. IMPRESSION: 1. Findings highly suggestive of either mastitis or inflammatory breast cancer, favor inflammatory breast cancer, with ill-defined edema throughout a significant portion of the left breast and overlying skin thickening. 2. Suspicious irregular hypoechoic area at the 2 o'clock axis, 4 cm  from the nipple, measuring 2.3 x 1.4 x 0.8 cm, for which ultrasound-guided core biopsy is recommended. 3. Suspicious round hypoechoic mass within the left axilla, measuring 0.6 x 0.4 cm, corresponding to the mammographic finding, most likely an abnormal lymph node, for which ultrasound-guided core biopsy is also recommended. 4. Additional hypoechoic mass within the left breast at the 1 o'clock axis, 3 cm from the nipple, with indistinct margins and anti-parallel orientation, measuring 1 x 0.8 x 1 cm. This is also a suspicious finding suggesting multifocal disease. 5. There is an additional morphologically normal lymph node within the left axilla, however, the surrounding soft tissues are heterogeneous (mixed-echogenicity), measuring 3.7 x 1.2 x 3.2 cm, corresponding to the area of palpable soft tissue swelling and mammographic finding, most likely related to soft tissue edema although infiltrative neoplastic process cannot be excluded. RECOMMENDATION: 1. Ultrasound-guided core biopsy of the suspicious irregular hypoechoic area at the 2 o'clock axis, 4 cm from the nipple, measuring 2.3 x 1.4 x 0.8 cm. 2. Ultrasound-guided core biopsy of the suspicious round hypoechoic mass within the left axilla, measuring 0.6 x 0.4 cm, suspected to be an abnormal lymph node. Ultrasound-guided biopsies are scheduled for Thursday, 08/04/2015. If ultrasound-guided biopsies are negative, a skin punch biopsy will likely be needed. If ultrasound-guided biopsies are positive, breast MRI will likely be needed given the high likelihood of multicentric disease. Findings and considerations were discussed with patient's nurse, Alison [name], on 08/01/2015 at 4:50 p.m. I have discussed the findings and recommendations with the patient. Results were also provided in writing at the conclusion of the visit. If applicable, a reminder letter will be sent to the patient regarding the next appointment. BI-RADS CATEGORY  4: Suspicious. Electronically Signed    By: Franki Cabot M.D.   On: 08/01/2015 17:18   Mm Diag Breast Tomo Uni Left  08/01/2015  CLINICAL DATA:  Patient describes thickening/hardness within the upper left breast, noticed only recently. Patient also describes swelling in the left axilla. Ordering physician describes left breast skin thickening and palpable mass in the upper outer quadrant. EXAM: DIGITAL DIAGNOSTIC LEFT MAMMOGRAM WITH 3D TOMOSYNTHESIS WITH CAD ULTRASOUND LEFT BREAST COMPARISON:  Previous exams including most recent screening mammogram of 10/19/2014 ACR Breast Density Category c: The breast tissue is heterogeneously dense, which may obscure small masses. FINDINGS: Left breast CC and MLO projections were obtained with 3D tomosynthesis. Additional spot compression tangential views of the left axilla, corresponding to the area of soft tissue swelling, was obtained with overlying skin marker in place. There is trabecular thickening throughout the left breast and skin thickening. No circumscribed mass or suspicious calcifications identified within  the left breast. A round mass within the left axilla measures 6 mm greatest dimension. There is an additional mildly prominent lymph node within the left axilla with surrounding trabecular thickening/edema, with overlying skin marker demonstrating this to be the site of patient's swelling. Mammographic images were processed with CAD. Targeted ultrasound is performed, showing ill-defined edema throughout the inner left breast and overlying skin thickening. There is also vague shadowing throughout the upper left breast. There is an irregular hypoechoic area at the 2 o'clock axis, 4 cm from the nipple, with internal vascularity, measuring 2.3 x 1.4 x 0.8 cm. There is an additional hypoechoic mass within the left breast at the 1 o'clock axis, 3 cm from the nipple, with indistinct margins and anti-parallel orientation, measuring 1 x 0.8 x 1 cm. There is a round hypoechoic mass within the left axilla,  measuring 0.6 x 0.4 x 0.5 cm. There is an additional morphologically normal lymph node within the left axilla, however, the surrounding soft tissues are heterogeneous (mixed-echogenicity), measuring 3.7 x 1.2 x 3.2 cm, corresponding to the area of palpable soft tissue swelling and mammographic finding. IMPRESSION: 1. Findings highly suggestive of either mastitis or inflammatory breast cancer, favor inflammatory breast cancer, with ill-defined edema throughout a significant portion of the left breast and overlying skin thickening. 2. Suspicious irregular hypoechoic area at the 2 o'clock axis, 4 cm from the nipple, measuring 2.3 x 1.4 x 0.8 cm, for which ultrasound-guided core biopsy is recommended. 3. Suspicious round hypoechoic mass within the left axilla, measuring 0.6 x 0.4 cm, corresponding to the mammographic finding, most likely an abnormal lymph node, for which ultrasound-guided core biopsy is also recommended. 4. Additional hypoechoic mass within the left breast at the 1 o'clock axis, 3 cm from the nipple, with indistinct margins and anti-parallel orientation, measuring 1 x 0.8 x 1 cm. This is also a suspicious finding suggesting multifocal disease. 5. There is an additional morphologically normal lymph node within the left axilla, however, the surrounding soft tissues are heterogeneous (mixed-echogenicity), measuring 3.7 x 1.2 x 3.2 cm, corresponding to the area of palpable soft tissue swelling and mammographic finding, most likely related to soft tissue edema although infiltrative neoplastic process cannot be excluded. RECOMMENDATION: 1. Ultrasound-guided core biopsy of the suspicious irregular hypoechoic area at the 2 o'clock axis, 4 cm from the nipple, measuring 2.3 x 1.4 x 0.8 cm. 2. Ultrasound-guided core biopsy of the suspicious round hypoechoic mass within the left axilla, measuring 0.6 x 0.4 cm, suspected to be an abnormal lymph node. Ultrasound-guided biopsies are scheduled for Thursday, 08/04/2015.  If ultrasound-guided biopsies are negative, a skin punch biopsy will likely be needed. If ultrasound-guided biopsies are positive, breast MRI will likely be needed given the high likelihood of multicentric disease. Findings and considerations were discussed with patient's nurse, Alison [name], on 08/01/2015 at 4:50 p.m. I have discussed the findings and recommendations with the patient. Results were also provided in writing at the conclusion of the visit. If applicable, a reminder letter will be sent to the patient regarding the next appointment. BI-RADS CATEGORY  4: Suspicious. Electronically Signed   By: Franki Cabot M.D.   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 This was found to be concordant by Dr. Everlean Alstrom. Pathology was called to Bary Castilla, RN, Nurse Navigator at [facility]. The  patient is being seen today at [facility]. Dr. Erroll Luna will notify the patient of her results. Pathology results reported by Susa Raring RN, BSN on August 10, 2015. Electronically Signed   By: Everlean Alstrom M.D.   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 EXAM: ULTRASOUND GUIDED CORE NEEDLE BIOPSY OF A LEFT AXILLARY NODE COMPARISON:  Previous exam(s). FINDINGS: I met with the patient and we discussed the procedure of ultrasound-guided biopsy, including benefits and alternatives. We discussed the high likelihood of a successful procedure. We discussed the risks of the procedure, including infection, bleeding, tissue injury, clip migration, and inadequate sampling. Informed  written consent was given. The usual time-out protocol was performed immediately prior to the procedure. Using sterile technique and 2% Lidocaine as local anesthetic, under direct ultrasound visualization, a 12 gauge spring-loaded device was used to perform biopsy of the round lymph node in the left axilla using a inferior to superior approach. At the conclusion of the procedure a HydroMARK tissue marker clip was deployed into the biopsy cavity, and resides adjacent to the biopsied lymph node. Follow up 2 view mammogram was performed and dictated separately. IMPRESSION: Ultrasound guided biopsy of an indeterminate round left axillary lymph node, with a HydroMARK tissue marker clip placed adjacent to the biopsied lymph node. Electronically Signed: By: Everlean Alstrom M.D. 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 This was found to be concordant by Dr. Franki Cabot. Pathology was discussed with the patient by telephone. She reported minimal bleeding from the sites on the evening of the biopsy, but has had no subsequent problems. Post biopsy instructions and care were reviewed and her questions were answered. She has a biopsy scheduled of a left axillary lymph node on August 09, 2015 and is aware of the appointment. The patient has been scheduled at [facility] on August 10, 2015. Bilateral breast MRI would be recommended to evaluate for multicentric disease, as patient has additional thickening and edema throughout the medial right breast. She was encouraged to come to [facility] for educational materials. My number was provided for additional questions and concerns. Pathology results reported by Susa Raring RN, BSN on  August 05, 2015. Electronically Signed   By: Franki Cabot M.D.   On: 08/05/2015 13:21  08/05/2015  CLINICAL DATA:  Patient with 2 masses in the upper left breast returns today for ultrasound-guided biopsies. Patient with additional suspicious nodule in the lower left axilla for ultrasound-guided biopsy. EXAM: ULTRASOUND GUIDED LEFT BREAST CORE NEEDLE BIOPSY COMPARISON:  Previous exam(s). PROCEDURE: I met with the patient and we discussed the procedure of ultrasound-guided biopsy, including benefits and alternatives. We discussed the high likelihood of a successful procedure. We discussed the risks of the procedure including infection, bleeding, tissue injury, clip migration, and inadequate sampling. Informed written consent was given. The usual time-out protocol was performed immediately prior to the procedure. Using sterile technique and 1% Lidocaine as local anesthetic, under direct ultrasound visualization, a 12 gauge spring-loaded  biopsy device was used to perform biopsy of the irregular hypoechoic mass at the 2 o'clock axis, 4 cm from the nipple,using a lateral approach. At the conclusion of the procedure, a ribbon shaped tissue marker clip was deployed into the biopsy cavity. Next, using sterile technique and 1% lidocaine as local anesthetic, under direct ultrasound visualization, a 12 gauge spring-loaded biopsy device was used to perform biopsy of the oval hypoechoic mass with anti-parallel orientation and indistinct margins located in the left breast at the 1 o'clock axis, 3 cm from the nipple, using a lateral approach. Today, this mass appeared more cystic but did not completely collapse during the biopsy. At the conclusion of the procedure, a coil shaped tissue marker clip was deployed into the biopsy site. Next, an attempt was made to biopsy the suspicious nodule in the lower left axilla. After administration of lidocaine, the nodule became completely obscured so biopsy was not possible. Follow-up  2-view mammogram was performed and dictated separately. IMPRESSION: Ultrasound-guided biopsy of the irregular hypoechoic mass at the 2 o'clock axis, 4 cm from the nipple. Ultrasound-guided biopsy of the cystic-appearing mass at the 1 o'clock axis, 3 cm from the nipple. No apparent complications. Electronically Signed: By: Franki Cabot M.D. 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 This was found to be concordant by Dr. Franki Cabot. Pathology was discussed with the patient by telephone. She reported minimal bleeding from the sites on the evening of the biopsy, but has had no subsequent problems. Post biopsy instructions and care were reviewed and her questions were answered. She has a biopsy scheduled of a left axillary lymph node on August 09, 2015 and is aware of the appointment. The patient has been scheduled at [facility] on August 10, 2015. Bilateral breast MRI would be recommended to evaluate for multicentric disease, as patient has additional thickening and edema throughout the medial right breast. She was encouraged to come to [facility] for educational materials. My number was provided for additional questions and concerns. Pathology results reported by Susa Raring RN, BSN on August 05, 2015. Electronically Signed   By: Franki Cabot M.D.   On: 08/05/2015 13:21  08/05/2015  CLINICAL DATA:  Patient with 2 masses in the upper left breast returns today for ultrasound-guided biopsies. Patient with additional suspicious nodule in the lower left axilla for ultrasound-guided biopsy. EXAM: ULTRASOUND GUIDED LEFT BREAST CORE NEEDLE BIOPSY COMPARISON:  Previous exam(s). PROCEDURE: I met with the patient and we  discussed the procedure of ultrasound-guided biopsy, including benefits and alternatives. We discussed the high likelihood of a successful procedure. We discussed the risks of the procedure including infection, bleeding, tissue injury, clip migration, and inadequate sampling. Informed written consent was given. The usual time-out protocol was performed immediately prior to the procedure. Using sterile technique and 1% Lidocaine as local anesthetic, under direct ultrasound visualization, a 12 gauge spring-loaded biopsy device was used to perform biopsy of the irregular hypoechoic mass at the 2 o'clock axis, 4 cm from the nipple,using a lateral approach. At the conclusion of the procedure, a ribbon shaped tissue marker clip was deployed into the biopsy cavity. Next, using sterile technique and 1% lidocaine as local anesthetic, under direct ultrasound visualization,  a 12 gauge spring-loaded biopsy device was used to perform biopsy of the oval hypoechoic mass with anti-parallel orientation and indistinct margins located in the left breast at the 1 o'clock axis, 3 cm from the nipple, using a lateral approach. Today, this mass appeared more cystic but did not completely collapse during the biopsy. At the conclusion of the procedure, a coil shaped tissue marker clip was deployed into the biopsy site. Next, an attempt was made to biopsy the suspicious nodule in the lower left axilla. After administration of lidocaine, the nodule became completely obscured so biopsy was not possible. Follow-up 2-view mammogram was performed and dictated separately. IMPRESSION: Ultrasound-guided biopsy of the irregular hypoechoic mass at the 2 o'clock axis, 4 cm from the nipple. Ultrasound-guided biopsy of the cystic-appearing mass at the 1 o'clock axis, 3 cm from the nipple. No apparent complications. Electronically Signed: By: Franki Cabot M.D. On: 08/04/2015 16:11    ASSESSMENT: 54 y.o. [place] woman status post 2 separate masses in  the left breast 08/04/2015, clinically mT2 N1, stage IIb/IIIa invasive ductal carcinoma, grade 3,, the larger mass being estrogen and progesterone receptor positive, HER-2 negative, with an MIB-1 of 12%  (a) biopsy of a left axillary lymph node 08/09/2015 was positive, with extracapsular extension  (1) neoadjuvant chemotherapy will consist of doxorubicin and cyclophosphamide in dose dense fashion 4 followed by weekly paclitaxel 12  (2) definitive surgery to follow  (3) adjuvant radiation to follow surgery  (4) anti-estrogens to follow completion of local treatment  (a) bone density August 2016 showed osteopenia  (b) the patient is status post total abdominal hysterectomy with bilateral salpingo-oophorectomy.  (5) the patient appears to be a good candidate for the Alliance trial  PLAN: We spent the better part of today's hour-long appointment discussing the biology of breast cancer in general, and the specifics of Tobi's tumor in particular. We discussed the difference between local and systemic therapies for breast cancer and she understands that in terms of survival there is no difference between lumpectomy plus radiation and mastectomy.  In terms of systemic therapy, she will benefit from both chemotherapy and antiestrogen therapy. She understands she is not a candidate for anti-HER-2 immunotherapy.  The question is whether to start with chemotherapy or with surgery. We discussed the fact that there is no difference in survival. In her case we suggest starting with chemotherapy because she will obtain prognostic information from the tumors measurable response, and because it will make the breast conserving surgery, if that is her final decision, more likely to be successful.  Accordingly the plan will be for cyclophosphamide and doxorubicin in dose dense fashion 4, followed by paclitaxel weekly 12. She will then proceed to surgery, then radiation. Once local treatment has been completed  she will start antiestrogen therapy which will continue for 5-10 years.  She will need a port placed. She will need a pretreatment MRI as well as MRIs in midcourse and just before surgery. Because she may be stage III we are obtaining staging scans as well. She will be a candidate for the Alliance trial. We will alert our research nurses regarding this.  Aidaly has a good understanding of the overall plan. She agrees with it. She knows the goal of treatment in her case is cure. She will call with any problems that may develop before her next visit here.  Chauncey Cruel, MD   08/12/2015 9:43 AM Medical Oncology and Hematology [facility] [address] [place], [place] [address]  Tel. [phone]    Fax. [phone]

## 2015-08-11 ENCOUNTER — Other Ambulatory Visit: Payer: 59

## 2015-08-11 ENCOUNTER — Other Ambulatory Visit: Payer: Self-pay | Admitting: *Deleted

## 2015-08-11 ENCOUNTER — Ambulatory Visit (HOSPITAL_COMMUNITY)
Admission: RE | Admit: 2015-08-11 | Discharge: 2015-08-11 | Disposition: A | Discharge status: Home / Self Care | Payer: 59 | Source: Ambulatory Visit | Attending: Oncology | Admitting: Oncology

## 2015-08-11 ENCOUNTER — Telehealth: Payer: Self-pay | Admitting: *Deleted

## 2015-08-11 ENCOUNTER — Encounter: Payer: Self-pay | Admitting: *Deleted

## 2015-08-11 ENCOUNTER — Other Ambulatory Visit: Payer: Self-pay | Admitting: Oncology

## 2015-08-11 DIAGNOSIS — I2510 Atherosclerotic heart disease of native coronary artery without angina pectoris: Secondary | ICD-10-CM | POA: Insufficient documentation

## 2015-08-11 DIAGNOSIS — Z87891 Personal history of nicotine dependence: Secondary | ICD-10-CM | POA: Insufficient documentation

## 2015-08-11 DIAGNOSIS — Z801 Family history of malignant neoplasm of trachea, bronchus and lung: Secondary | ICD-10-CM | POA: Diagnosis not present

## 2015-08-11 DIAGNOSIS — M4856XA Collapsed vertebra, not elsewhere classified, lumbar region, initial encounter for fracture: Secondary | ICD-10-CM | POA: Diagnosis not present

## 2015-08-11 DIAGNOSIS — Z5111 Encounter for antineoplastic chemotherapy: Secondary | ICD-10-CM | POA: Diagnosis not present

## 2015-08-11 DIAGNOSIS — Z882 Allergy status to sulfonamides status: Secondary | ICD-10-CM | POA: Insufficient documentation

## 2015-08-11 DIAGNOSIS — N6489 Other specified disorders of breast: Secondary | ICD-10-CM | POA: Insufficient documentation

## 2015-08-11 DIAGNOSIS — Z79899 Other long term (current) drug therapy: Secondary | ICD-10-CM | POA: Insufficient documentation

## 2015-08-11 DIAGNOSIS — C50412 Malignant neoplasm of upper-outer quadrant of left female breast: Secondary | ICD-10-CM | POA: Diagnosis present

## 2015-08-11 DIAGNOSIS — I10 Essential (primary) hypertension: Secondary | ICD-10-CM | POA: Diagnosis not present

## 2015-08-11 DIAGNOSIS — M8580 Other specified disorders of bone density and structure, unspecified site: Secondary | ICD-10-CM | POA: Insufficient documentation

## 2015-08-11 DIAGNOSIS — Z85828 Personal history of other malignant neoplasm of skin: Secondary | ICD-10-CM | POA: Diagnosis not present

## 2015-08-11 DIAGNOSIS — I700 Atherosclerosis of aorta: Secondary | ICD-10-CM | POA: Insufficient documentation

## 2015-08-11 DIAGNOSIS — C50912 Malignant neoplasm of unspecified site of left female breast: Secondary | ICD-10-CM

## 2015-08-11 NOTE — Progress Notes (Signed)
  Echocardiogram 2D Echocardiogram has been performed.  Joelene Millin 08/11/2015, 12:11 PM

## 2015-08-11 NOTE — Telephone Encounter (Signed)
Call received from [facility] with central scheduling.  NM med scheduled yesterday needs order signed. In-basket message sent or order can be faxed to [phone].

## 2015-08-12 ENCOUNTER — Other Ambulatory Visit: Payer: Self-pay | Admitting: Radiology

## 2015-08-12 ENCOUNTER — Encounter (HOSPITAL_COMMUNITY): Payer: Self-pay

## 2015-08-12 ENCOUNTER — Ambulatory Visit (HOSPITAL_COMMUNITY)
Admission: RE | Admit: 2015-08-12 | Discharge: 2015-08-12 | Disposition: A | Discharge status: Home / Self Care | Payer: 59 | Source: Ambulatory Visit | Attending: Oncology | Admitting: Oncology

## 2015-08-12 ENCOUNTER — Encounter (HOSPITAL_COMMUNITY)
Admission: RE | Admit: 2015-08-12 | Discharge: 2015-08-12 | Disposition: A | Discharge status: Home / Self Care | Payer: 59 | Source: Ambulatory Visit | Attending: Oncology | Admitting: Oncology

## 2015-08-12 ENCOUNTER — Encounter (HOSPITAL_COMMUNITY): Admission: RE | Admit: 2015-08-12 | Payer: 59 | Source: Ambulatory Visit

## 2015-08-12 ENCOUNTER — Encounter: Payer: Self-pay | Admitting: Oncology

## 2015-08-12 DIAGNOSIS — C50412 Malignant neoplasm of upper-outer quadrant of left female breast: Secondary | ICD-10-CM

## 2015-08-12 MED ORDER — IOHEXOL 300 MG/ML  SOLN
75.0000 mL | Freq: Once | INTRAMUSCULAR | Status: AC | PRN
  Administered 2015-08-12: 75 mL via INTRAVENOUS

## 2015-08-12 MED ORDER — TECHNETIUM TC 99M MEDRONATE IV KIT
25.4000 | PACK | Freq: Once | INTRAVENOUS | Status: AC | PRN
  Administered 2015-08-12: 25.4 via INTRAVENOUS

## 2015-08-15 ENCOUNTER — Other Ambulatory Visit (HOSPITAL_COMMUNITY): Payer: Self-pay | Admitting: Surgery

## 2015-08-15 ENCOUNTER — Telehealth: Payer: Self-pay | Admitting: *Deleted

## 2015-08-15 ENCOUNTER — Ambulatory Visit (HOSPITAL_COMMUNITY)
Admission: RE | Admit: 2015-08-15 | Discharge: 2015-08-15 | Disposition: A | Discharge status: Home / Self Care | Payer: 59 | Source: Ambulatory Visit | Attending: Surgery | Admitting: Surgery

## 2015-08-15 DIAGNOSIS — C50412 Malignant neoplasm of upper-outer quadrant of left female breast: Secondary | ICD-10-CM

## 2015-08-15 DIAGNOSIS — Z7951 Long term (current) use of inhaled steroids: Secondary | ICD-10-CM

## 2015-08-15 DIAGNOSIS — I10 Essential (primary) hypertension: Secondary | ICD-10-CM

## 2015-08-15 DIAGNOSIS — Z85828 Personal history of other malignant neoplasm of skin: Secondary | ICD-10-CM

## 2015-08-15 DIAGNOSIS — Z87891 Personal history of nicotine dependence: Secondary | ICD-10-CM

## 2015-08-15 DIAGNOSIS — Z882 Allergy status to sulfonamides status: Secondary | ICD-10-CM | POA: Insufficient documentation

## 2015-08-15 DIAGNOSIS — I2510 Atherosclerotic heart disease of native coronary artery without angina pectoris: Secondary | ICD-10-CM | POA: Insufficient documentation

## 2015-08-15 DIAGNOSIS — C50912 Malignant neoplasm of unspecified site of left female breast: Secondary | ICD-10-CM

## 2015-08-15 DIAGNOSIS — I7300 Raynaud's syndrome without gangrene: Secondary | ICD-10-CM | POA: Insufficient documentation

## 2015-08-15 LAB — CBC WITH DIFFERENTIAL/PLATELET
BASOS PCT: 2 %
Basophils Absolute: 0.1 10*3/uL (ref 0.0–0.1)
EOS ABS: 0.2 10*3/uL (ref 0.0–0.7)
EOS PCT: 4 %
HCT: 36.8 % (ref 36.0–46.0)
HEMOGLOBIN: 12.2 g/dL (ref 12.0–15.0)
Lymphocytes Relative: 39 %
Lymphs Abs: 2.3 10*3/uL (ref 0.7–4.0)
MCH: 31.4 pg (ref 26.0–34.0)
MCHC: 33.2 g/dL (ref 30.0–36.0)
MCV: 94.6 fL (ref 78.0–100.0)
MONO ABS: 0.6 10*3/uL (ref 0.1–1.0)
MONOS PCT: 10 %
Neutro Abs: 2.6 10*3/uL (ref 1.7–7.7)
Neutrophils Relative %: 45 %
Platelets: 403 10*3/uL — ABNORMAL HIGH (ref 150–400)
RBC: 3.89 MIL/uL (ref 3.87–5.11)
RDW: 13.1 % (ref 11.5–15.5)
WBC: 5.9 10*3/uL (ref 4.0–10.5)

## 2015-08-15 LAB — PROTIME-INR
INR: 1.03 (ref 0.00–1.49)
PROTHROMBIN TIME: 13.7 s (ref 11.6–15.2)

## 2015-08-15 MED ORDER — FENTANYL CITRATE (PF) 100 MCG/2ML IJ SOLN
INTRAMUSCULAR | Status: AC
  Filled 2015-08-15: qty 2

## 2015-08-15 MED ORDER — CEFAZOLIN SODIUM-DEXTROSE 2-3 GM-% IV SOLR
2.0000 g | INTRAVENOUS | Status: AC
  Administered 2015-08-15: 2 g via INTRAVENOUS

## 2015-08-15 MED ORDER — LIDOCAINE HCL (PF) 1 % IJ SOLN
INTRAMUSCULAR | Status: AC
  Filled 2015-08-15: qty 30

## 2015-08-15 MED ORDER — MIDAZOLAM HCL 2 MG/2ML IJ SOLN
INTRAMUSCULAR | Status: AC
  Filled 2015-08-15: qty 2

## 2015-08-15 MED ORDER — CEFAZOLIN SODIUM-DEXTROSE 2-3 GM-% IV SOLR
INTRAVENOUS | Status: AC
  Filled 2015-08-15: qty 50

## 2015-08-15 MED ORDER — FENTANYL CITRATE (PF) 100 MCG/2ML IJ SOLN
INTRAMUSCULAR | Status: AC | PRN
  Administered 2015-08-15 (×2): 50 ug via INTRAVENOUS

## 2015-08-15 MED ORDER — SODIUM CHLORIDE 0.9 % IV SOLN
INTRAVENOUS | Status: DC
  Administered 2015-08-15: 1000 mL via INTRAVENOUS

## 2015-08-15 MED ORDER — HEPARIN SOD (PORK) LOCK FLUSH 100 UNIT/ML IV SOLN
INTRAVENOUS | Status: AC
  Filled 2015-08-15: qty 5

## 2015-08-15 MED ORDER — LIDOCAINE-EPINEPHRINE (PF) 1 %-1:200000 IJ SOLN
INTRAMUSCULAR | Status: AC
  Filled 2015-08-15: qty 30

## 2015-08-15 MED ORDER — MIDAZOLAM HCL 2 MG/2ML IJ SOLN
INTRAMUSCULAR | Status: AC | PRN
  Administered 2015-08-15 (×2): 1 mg via INTRAVENOUS

## 2015-08-15 NOTE — Discharge Instructions (Signed)

## 2015-08-15 NOTE — Procedures (Signed)
R IJ Port cathter placement with US and fluoroscopy No complication No blood loss. See complete dictation in [facility] PACS.  

## 2015-08-15 NOTE — Sedation Documentation (Signed)
Vital signs stable. 

## 2015-08-15 NOTE — Telephone Encounter (Signed)
Spoke with patient from [facility] 08/10/15.  She is doing well.  She had her port put in today that she is a little sore from.  Encouraged her to call with any needs or concerns.

## 2015-08-15 NOTE — H&P (Signed)
Chief Complaint: Patient was seen in consultation today for Port-A-Cath placement   Referring Physician(s): Cornett,Thomas/Magrinat,G  History of Present Illness: Alison [name] is a 54 y.o. female with history of newly diagnosed left breast carcinoma who presents today for Port-A-Cath placement for chemotherapy.  Past Medical History  Diagnosis Date  . Hypertension   . Raynaud's disease   . Skin cancer   . Rectovaginal fistula     repaired 2014  . Osteopenia determined by x-ray     Past Surgical History  Procedure Laterality Date  . C-section    . Abdominal hysterectomy      with BSO  . Appendectomy    . Colon resection    . Hernia repair      Allergies: Sulfa antibiotics  Medications: Prior to Admission medications   Medication Sig Start Date End Date Taking? Authorizing Provider  ALPRAZolam Duanne Moron) 0.5 MG tablet Take 1 tablet (0.5 mg total) by mouth as needed for anxiety (take 1 tab 1 hour prior to MRI. Take 1 tab prior to start of MRI). 08/10/15  Yes Chauncey Cruel, MD  cetirizine (ZYRTEC) 10 MG tablet Take 10 mg by mouth daily.   Yes Historical Provider, MD  Cholecalciferol (VITAMIN D3) 2000 UNITS capsule Take 2,000 Units by mouth daily.    Yes Historical Provider, MD  dextroamphetamine (DEXEDRINE SPANSULE) 15 MG 24 hr capsule Take 15 mg by mouth 2 (two) times daily.    Yes Historical Provider, MD  Fenofibric Acid 105 MG TABS Take 1 tablet by mouth daily.   Yes Historical Provider, MD  fluticasone (FLONASE) 50 MCG/ACT nasal spray Place 2 sprays into both nostrils daily.   Yes Historical Provider, MD  gabapentin (NEURONTIN) 300 MG capsule Take 1 capsule (300 mg total) by mouth at bedtime. 08/10/15  Yes Chauncey Cruel, MD  Icosapent Ethyl (VASCEPA) 1 G CAPS Take 2 capsules by mouth 2 (two) times daily.   Yes Historical Provider, MD  Multiple Vitamin (MULTIVITAMIN) tablet Take 1 tablet by mouth daily.   Yes Historical Provider, MD  NIFEdipine  (PROCARDIA-XL/ADALAT-CC/NIFEDICAL-XL) 30 MG 24 hr tablet Take 30 mg by mouth daily.    Yes Historical Provider, MD  olmesartan (BENICAR) 40 MG tablet Take 40 mg by mouth daily.   Yes Historical Provider, MD  Probiotic Product (PROBIOTIC DAILY PO) Take 1 capsule by mouth daily.    Yes Historical Provider, MD  traZODone (DESYREL) 150 MG tablet Take 150 mg by mouth at bedtime.   Yes Historical Provider, MD     Family History  Problem Relation Age of Onset  . Lung cancer Father     Social History   Social History  . Marital Status: Married    Spouse Name: N/A  . Number of Children: N/A  . Years of Education: N/A   Social History Main Topics  . Smoking status: Former Smoker -- 1.00 packs/day    Types: Cigarettes    Quit date: 11/25/2008  . Smokeless tobacco: Not on file  . Alcohol Use: No  . Drug Use: No  . Sexual Activity: Not on file   Other Topics Concern  . Not on file   Social History Narrative      Review of Systems  Constitutional: Negative for fever and chills.  Respiratory: Negative for cough and shortness of breath.   Cardiovascular: Negative for chest pain.  Gastrointestinal: Negative for nausea, vomiting, abdominal pain and blood in stool.  Genitourinary: Negative for dysuria and hematuria.  Musculoskeletal: Negative for back pain.  Neurological: Negative for headaches.  Psychiatric/Behavioral: The patient is nervous/anxious.     Vital Signs: BP 122/76 mmHg  Pulse 88  Temp(Src) 97.7 F (36.5 C) (Oral)  Resp 18  Ht 5' 3"  (1.6 m)  Wt 141 lb (63.957 kg)  BMI 24.98 kg/m2  SpO2 90%  Physical Exam  Constitutional: She is oriented to person, place, and time. She appears well-developed and well-nourished.  Cardiovascular: Normal rate and regular rhythm.   Pulmonary/Chest: Effort normal and breath sounds normal.  Abdominal: Soft. Bowel sounds are normal. There is no tenderness.  Musculoskeletal: Normal range of motion. She exhibits no edema.    Neurological: She is alert and oriented to person, place, and time.    Mallampati Score:     Imaging: Ct Chest W Contrast  08/12/2015  CLINICAL DATA:  54 year old female with newly diagnosed left-sided breast cancer on 08/10/2015. EXAM: CT CHEST WITH CONTRAST TECHNIQUE: Multidetector CT imaging of the chest was performed during intravenous contrast administration. CONTRAST:  73m OMNIPAQUE IOHEXOL 300 MG/ML  SOLN COMPARISON:  Chest CT 11/12/2006. FINDINGS: Mediastinum/Lymph Nodes: Heart size is normal. There is no significant pericardial fluid, thickening or pericardial calcification. There is atherosclerosis of the thoracic aorta, the great vessels of the mediastinum and the coronary arteries, including calcified atherosclerotic plaque in the distal left main and proximal left anterior descending coronary arteries. No pathologically enlarged mediastinal, internal mammary or hilar lymph nodes. Esophagus is unremarkable in appearance. No axillary lymphadenopathy. Lungs/Pleura: No suspicious appearing pulmonary nodules or masses. No acute consolidative airspace disease. No pleural effusions. Upper Abdomen: Bilateral adrenal glands are visualized and are normal in appearance. Other structures are unremarkable. Musculoskeletal/Soft Tissues: Asymmetric skin thickening in the left breast. No discrete left breast mass is confidently identified on today's CT examination. Chronic compression fracture of L1 with approximately 15% loss of anterior vertebral body height, unchanged compared to 2008. There are no aggressive appearing lytic or blastic lesions noted in the visualized portions of the skeleton. IMPRESSION: 1. Asymmetric skin thickening in the left breast. No discrete breast mass or left axillary adenopathy is identified on today's CT examination. 2. No findings to suggest metastatic disease in the thorax. 3. Atherosclerosis, including left main and left anterior descending coronary artery disease. Please  note that although the presence of coronary artery calcium documents the presence of coronary artery disease, the severity of this disease and any potential stenosis cannot be assessed on this non-gated CT examination. Assessment for potential risk factor modification, dietary therapy or pharmacologic therapy may be warranted, if clinically indicated. Electronically Signed   By: DVinnie LangtonM.D.   On: 08/12/2015 09:32   Nm Bone Scan Whole Body  08/12/2015  CLINICAL DATA:  Breast cancer of upper outer quadrant of left female breast. EXAM: NUCLEAR MEDICINE WHOLE BODY BONE SCAN TECHNIQUE: Whole body anterior and posterior images were obtained approximately 3 hours after intravenous injection of radiopharmaceutical. RADIOPHARMACEUTICALS:  25.4 mCi Technetium-948mDP IV COMPARISON:  None. FINDINGS: Small focus of abnormal uptake is seen to the right of the cervical spine most consistent with degenerative change. No other areas of abnormal uptake are noted. IMPRESSION: No definite evidence of osseous metastases. Electronically Signed   By: JaMarijo ConceptionM.D.   On: 08/12/2015 11:21   Mm Digital Diagnostic Unilat L  08/04/2015  CLINICAL DATA:  Status post ultrasound-guided biopsy of 2 suspicious masses in the left breast. EXAM: DIAGNOSTIC LEFT MAMMOGRAM POST ULTRASOUND BIOPSY COMPARISON:  Previous exam(s). FINDINGS: Mammographic  images were obtained following ultrasound guided biopsy of suspicious masses at the 1 o'clock axis and 2 o'clock axis of the left breast. At the conclusion of each biopsy, tissue markers were placed at each biopsy site. Both of these clips appeared well-positioned by ultrasound and are adequately positioned on this postprocedure mammogram. IMPRESSION: Postprocedure mammogram for clip placement. Two biopsy clips are well-positioned at their respective biopsy sites. Final Assessment: Post Procedure Mammograms for Marker Placement Electronically Signed   By: Franki Cabot M.D.   On:  08/04/2015 16:13   US Breast Ltd Uni Left Inc Axilla  08/01/2015  CLINICAL DATA:  Patient describes thickening/hardness within the upper left breast, noticed only recently. Patient also describes swelling in the left axilla. Ordering physician describes left breast skin thickening and palpable mass in the upper outer quadrant. EXAM: DIGITAL DIAGNOSTIC LEFT MAMMOGRAM WITH 3D TOMOSYNTHESIS WITH CAD ULTRASOUND LEFT BREAST COMPARISON:  Previous exams including most recent screening mammogram of 10/19/2014 ACR Breast Density Category c: The breast tissue is heterogeneously dense, which may obscure small masses. FINDINGS: Left breast CC and MLO projections were obtained with 3D tomosynthesis. Additional spot compression tangential views of the left axilla, corresponding to the area of soft tissue swelling, was obtained with overlying skin marker in place. There is trabecular thickening throughout the left breast and skin thickening. No circumscribed mass or suspicious calcifications identified within the left breast. A round mass within the left axilla measures 6 mm greatest dimension. There is an additional mildly prominent lymph node within the left axilla with surrounding trabecular thickening/edema, with overlying skin marker demonstrating this to be the site of patient's swelling. Mammographic images were processed with CAD. Targeted ultrasound is performed, showing ill-defined edema throughout the inner left breast and overlying skin thickening. There is also vague shadowing throughout the upper left breast. There is an irregular hypoechoic area at the 2 o'clock axis, 4 cm from the nipple, with internal vascularity, measuring 2.3 x 1.4 x 0.8 cm. There is an additional hypoechoic mass within the left breast at the 1 o'clock axis, 3 cm from the nipple, with indistinct margins and anti-parallel orientation, measuring 1 x 0.8 x 1 cm. There is a round hypoechoic mass within the left axilla, measuring 0.6 x 0.4 x 0.5  cm. There is an additional morphologically normal lymph node within the left axilla, however, the surrounding soft tissues are heterogeneous (mixed-echogenicity), measuring 3.7 x 1.2 x 3.2 cm, corresponding to the area of palpable soft tissue swelling and mammographic finding. IMPRESSION: 1. Findings highly suggestive of either mastitis or inflammatory breast cancer, favor inflammatory breast cancer, with ill-defined edema throughout a significant portion of the left breast and overlying skin thickening. 2. Suspicious irregular hypoechoic area at the 2 o'clock axis, 4 cm from the nipple, measuring 2.3 x 1.4 x 0.8 cm, for which ultrasound-guided core biopsy is recommended. 3. Suspicious round hypoechoic mass within the left axilla, measuring 0.6 x 0.4 cm, corresponding to the mammographic finding, most likely an abnormal lymph node, for which ultrasound-guided core biopsy is also recommended. 4. Additional hypoechoic mass within the left breast at the 1 o'clock axis, 3 cm from the nipple, with indistinct margins and anti-parallel orientation, measuring 1 x 0.8 x 1 cm. This is also a suspicious finding suggesting multifocal disease. 5. There is an additional morphologically normal lymph node within the left axilla, however, the surrounding soft tissues are heterogeneous (mixed-echogenicity), measuring 3.7 x 1.2 x 3.2 cm, corresponding to the area of palpable soft tissue swelling and  mammographic finding, most likely related to soft tissue edema although infiltrative neoplastic process cannot be excluded. RECOMMENDATION: 1. Ultrasound-guided core biopsy of the suspicious irregular hypoechoic area at the 2 o'clock axis, 4 cm from the nipple, measuring 2.3 x 1.4 x 0.8 cm. 2. Ultrasound-guided core biopsy of the suspicious round hypoechoic mass within the left axilla, measuring 0.6 x 0.4 cm, suspected to be an abnormal lymph node. Ultrasound-guided biopsies are scheduled for Thursday, 08/04/2015. If ultrasound-guided  biopsies are negative, a skin punch biopsy will likely be needed. If ultrasound-guided biopsies are positive, breast MRI will likely be needed given the high likelihood of multicentric disease. Findings and considerations were discussed with patient's nurse, Jonelle Sidle, on 08/01/2015 at 4:50 p.m. I have discussed the findings and recommendations with the patient. Results were also provided in writing at the conclusion of the visit. If applicable, a reminder letter will be sent to the patient regarding the next appointment. BI-RADS CATEGORY  4: Suspicious. Electronically Signed   By: Franki Cabot M.D.   On: 08/01/2015 17:18   Mm Diag Breast Tomo Uni Left  08/01/2015  CLINICAL DATA:  Patient describes thickening/hardness within the upper left breast, noticed only recently. Patient also describes swelling in the left axilla. Ordering physician describes left breast skin thickening and palpable mass in the upper outer quadrant. EXAM: DIGITAL DIAGNOSTIC LEFT MAMMOGRAM WITH 3D TOMOSYNTHESIS WITH CAD ULTRASOUND LEFT BREAST COMPARISON:  Previous exams including most recent screening mammogram of 10/19/2014 ACR Breast Density Category c: The breast tissue is heterogeneously dense, which may obscure small masses. FINDINGS: Left breast CC and MLO projections were obtained with 3D tomosynthesis. Additional spot compression tangential views of the left axilla, corresponding to the area of soft tissue swelling, was obtained with overlying skin marker in place. There is trabecular thickening throughout the left breast and skin thickening. No circumscribed mass or suspicious calcifications identified within the left breast. A round mass within the left axilla measures 6 mm greatest dimension. There is an additional mildly prominent lymph node within the left axilla with surrounding trabecular thickening/edema, with overlying skin marker demonstrating this to be the site of patient's swelling. Mammographic images were processed with  CAD. Targeted ultrasound is performed, showing ill-defined edema throughout the inner left breast and overlying skin thickening. There is also vague shadowing throughout the upper left breast. There is an irregular hypoechoic area at the 2 o'clock axis, 4 cm from the nipple, with internal vascularity, measuring 2.3 x 1.4 x 0.8 cm. There is an additional hypoechoic mass within the left breast at the 1 o'clock axis, 3 cm from the nipple, with indistinct margins and anti-parallel orientation, measuring 1 x 0.8 x 1 cm. There is a round hypoechoic mass within the left axilla, measuring 0.6 x 0.4 x 0.5 cm. There is an additional morphologically normal lymph node within the left axilla, however, the surrounding soft tissues are heterogeneous (mixed-echogenicity), measuring 3.7 x 1.2 x 3.2 cm, corresponding to the area of palpable soft tissue swelling and mammographic finding. IMPRESSION: 1. Findings highly suggestive of either mastitis or inflammatory breast cancer, favor inflammatory breast cancer, with ill-defined edema throughout a significant portion of the left breast and overlying skin thickening. 2. Suspicious irregular hypoechoic area at the 2 o'clock axis, 4 cm from the nipple, measuring 2.3 x 1.4 x 0.8 cm, for which ultrasound-guided core biopsy is recommended. 3. Suspicious round hypoechoic mass within the left axilla, measuring 0.6 x 0.4 cm, corresponding to the mammographic finding, most likely an abnormal lymph node,  for which ultrasound-guided core biopsy is also recommended. 4. Additional hypoechoic mass within the left breast at the 1 o'clock axis, 3 cm from the nipple, with indistinct margins and anti-parallel orientation, measuring 1 x 0.8 x 1 cm. This is also a suspicious finding suggesting multifocal disease. 5. There is an additional morphologically normal lymph node within the left axilla, however, the surrounding soft tissues are heterogeneous (mixed-echogenicity), measuring 3.7 x 1.2 x 3.2 cm,  corresponding to the area of palpable soft tissue swelling and mammographic finding, most likely related to soft tissue edema although infiltrative neoplastic process cannot be excluded. RECOMMENDATION: 1. Ultrasound-guided core biopsy of the suspicious irregular hypoechoic area at the 2 o'clock axis, 4 cm from the nipple, measuring 2.3 x 1.4 x 0.8 cm. 2. Ultrasound-guided core biopsy of the suspicious round hypoechoic mass within the left axilla, measuring 0.6 x 0.4 cm, suspected to be an abnormal lymph node. Ultrasound-guided biopsies are scheduled for Thursday, 08/04/2015. If ultrasound-guided biopsies are negative, a skin punch biopsy will likely be needed. If ultrasound-guided biopsies are positive, breast MRI will likely be needed given the high likelihood of multicentric disease. Findings and considerations were discussed with patient's nurse, Jonelle Sidle, on 08/01/2015 at 4:50 p.m. I have discussed the findings and recommendations with the patient. Results were also provided in writing at the conclusion of the visit. If applicable, a reminder letter will be sent to the patient regarding the next appointment. BI-RADS CATEGORY  4: Suspicious. Electronically Signed   By: Franki Cabot M.D.   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 This was found to be concordant by Dr. Everlean Alstrom. Pathology was called to Bary Castilla, RN, Nurse Navigator at [facility]. The patient is being seen today at [facility]. Dr. Erroll Luna will notify the patient of her results. Pathology results reported by Susa Raring RN, BSN on August 10, 2015. Electronically Signed   By: Everlean Alstrom M.D.   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 EXAM: ULTRASOUND GUIDED CORE NEEDLE BIOPSY OF A LEFT AXILLARY NODE COMPARISON:  Previous exam(s). FINDINGS: I met with the patient and we discussed the procedure of ultrasound-guided biopsy, including benefits and alternatives. We discussed the high likelihood of a successful procedure. We discussed the risks of the procedure, including infection, bleeding, tissue injury, clip migration, and inadequate sampling. Informed written consent was given. The usual time-out protocol was performed immediately prior to the procedure. Using sterile technique and 2% Lidocaine as local anesthetic, under direct ultrasound visualization, a 12 gauge spring-loaded device was used to perform biopsy of the round lymph node in the left axilla using a inferior to superior approach. At the conclusion of the procedure a HydroMARK tissue marker clip was deployed into the biopsy cavity, and resides adjacent to the biopsied lymph node. Follow up 2 view mammogram was performed and dictated separately. IMPRESSION: Ultrasound guided biopsy of an indeterminate round left axillary lymph node, with a HydroMARK tissue marker clip placed adjacent to the biopsied lymph node. Electronically Signed: By: Everlean Alstrom M.D. 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 This was found to be concordant by Dr. Franki Cabot. Pathology was discussed with the patient by telephone. She reported minimal bleeding from the sites on the evening of the biopsy, but has had no subsequent problems. Post biopsy instructions and care were reviewed and her questions were answered. She has a biopsy  scheduled of a left axillary lymph node on August 09, 2015 and is aware of the appointment. The patient has been scheduled at [facility] on August 10, 2015. Bilateral breast MRI would be recommended to evaluate for multicentric disease, as patient has additional thickening and edema throughout the medial right breast. She was encouraged to come to [facility] for educational materials. My number was provided for additional questions and concerns. Pathology results reported by Susa Raring RN, BSN on August 05, 2015. Electronically Signed   By: Franki Cabot M.D.   On: 08/05/2015 13:21  08/05/2015  CLINICAL DATA:  Patient with 2 masses in the upper left breast returns today for ultrasound-guided biopsies. Patient with additional suspicious nodule in the lower left axilla for ultrasound-guided biopsy. EXAM: ULTRASOUND GUIDED LEFT BREAST CORE NEEDLE BIOPSY COMPARISON:  Previous exam(s). PROCEDURE: I met with the patient and we discussed the procedure of ultrasound-guided biopsy, including benefits and alternatives. We discussed the high likelihood of a successful procedure. We discussed the risks of the procedure including infection, bleeding, tissue injury, clip migration, and inadequate sampling. Informed written consent was given. The usual time-out protocol was performed immediately prior to the procedure. Using sterile technique and 1% Lidocaine as local anesthetic, under direct ultrasound visualization, a 12 gauge spring-loaded biopsy device was used to perform biopsy of the irregular hypoechoic mass at the 2 o'clock axis, 4 cm from the nipple,using a lateral approach. At the conclusion of the procedure, a ribbon shaped tissue marker clip was deployed into the biopsy cavity. Next, using sterile technique and 1% lidocaine as local anesthetic, under direct ultrasound visualization, a 12 gauge spring-loaded biopsy device was used to perform  biopsy of the oval hypoechoic mass with anti-parallel orientation and indistinct margins located in the left breast at the 1 o'clock axis, 3 cm from the nipple, using a lateral approach. Today, this mass appeared more cystic but did not completely collapse during the biopsy. At the conclusion of the procedure, a coil shaped tissue marker clip was deployed into the biopsy site. Next, an attempt was made to biopsy the suspicious nodule in the lower left axilla. After administration of lidocaine, the nodule became completely obscured so biopsy was not possible. Follow-up 2-view mammogram was performed and dictated separately. IMPRESSION: Ultrasound-guided biopsy of the irregular hypoechoic mass at the 2 o'clock axis, 4 cm from the nipple. Ultrasound-guided biopsy of the cystic-appearing mass at the 1 o'clock axis, 3 cm from the nipple. No apparent complications. Electronically Signed: By: Franki Cabot M.D. 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 This was found to be concordant by Dr. Franki Cabot. Pathology was discussed with the patient  by telephone. She reported minimal bleeding from the sites on the evening of the biopsy, but has had no subsequent problems. Post biopsy instructions and care were reviewed and her questions were answered. She has a biopsy scheduled of a left axillary lymph node on August 09, 2015 and is aware of the appointment. The patient has been scheduled at [facility] on August 10, 2015. Bilateral breast MRI would be recommended to evaluate for multicentric disease, as patient has additional thickening and edema throughout the medial right breast. She was encouraged to come to [facility] for  educational materials. My number was provided for additional questions and concerns. Pathology results reported by Susa Raring RN, BSN on August 05, 2015. Electronically Signed   By: Franki Cabot M.D.   On: 08/05/2015 13:21  08/05/2015  CLINICAL DATA:  Patient with 2 masses in the upper left breast returns today for ultrasound-guided biopsies. Patient with additional suspicious nodule in the lower left axilla for ultrasound-guided biopsy. EXAM: ULTRASOUND GUIDED LEFT BREAST CORE NEEDLE BIOPSY COMPARISON:  Previous exam(s). PROCEDURE: I met with the patient and we discussed the procedure of ultrasound-guided biopsy, including benefits and alternatives. We discussed the high likelihood of a successful procedure. We discussed the risks of the procedure including infection, bleeding, tissue injury, clip migration, and inadequate sampling. Informed written consent was given. The usual time-out protocol was performed immediately prior to the procedure. Using sterile technique and 1% Lidocaine as local anesthetic, under direct ultrasound visualization, a 12 gauge spring-loaded biopsy device was used to perform biopsy of the irregular hypoechoic mass at the 2 o'clock axis, 4 cm from the nipple,using a lateral approach. At the conclusion of the procedure, a ribbon shaped tissue marker clip was deployed into the biopsy cavity. Next, using sterile technique and 1% lidocaine as local anesthetic, under direct ultrasound visualization, a 12 gauge spring-loaded biopsy device was used to perform biopsy of the oval hypoechoic mass with anti-parallel orientation and indistinct margins located in the left breast at the 1 o'clock axis, 3 cm from the nipple, using a lateral approach. Today, this mass appeared more cystic but did not completely collapse during the biopsy. At the conclusion of the procedure, a coil shaped tissue marker clip was deployed into the biopsy site. Next, an attempt was made to biopsy the suspicious nodule in  the lower left axilla. After administration of lidocaine, the nodule became completely obscured so biopsy was not possible. Follow-up 2-view mammogram was performed and dictated separately. IMPRESSION: Ultrasound-guided biopsy of the irregular hypoechoic mass at the 2 o'clock axis, 4 cm from the nipple. Ultrasound-guided biopsy of the cystic-appearing mass at the 1 o'clock axis, 3 cm from the nipple. No apparent complications. Electronically Signed: By: Franki Cabot M.D. On: 08/04/2015 16:11    Labs:  CBC:  Recent Labs  08/10/15 0854  WBC 6.0  HGB 12.3  HCT 36.7  PLT 340    COAGS: No results for input(s): INR, APTT in the last 8760 hours.  BMP:  Recent Labs  08/10/15 0854  NA 137  K 4.4  CO2 21*  GLUCOSE 104  BUN 23.1  CALCIUM 9.8  CREATININE 0.9    LIVER FUNCTION TESTS:  Recent Labs  08/10/15 0854  BILITOT 0.35  AST 43*  ALT 44  ALKPHOS 37*  PROT 7.7  ALBUMIN 4.1    TUMOR MARKERS: No results for input(s): AFPTM, CEA, CA199, CHROMGRNA in the last 8760 hours.  Assessment and  Plan: 54 y.o. female with history of newly diagnosed left breast carcinoma who presents today for Port-A-Cath placement for chemotherapy.Risks and benefits discussed with the patient/husband including, but not limited to bleeding, infection, pneumothorax, or fibrin sheath development and need for additional procedures.All of the patient's questions were answered, patient is agreeable to proceed. Consent signed and in chart.      Thank you for this interesting consult.  I greatly enjoyed meeting KEYIRA MONDESIR and look forward to participating in their care.  A copy of this report was sent to the requesting provider on this date.  Signed: D. Rowe Robert 08/15/2015, 8:34 AM   I spent a total of 15 minutes in face to face in clinical consultation, greater than 50% of which was counseling/coordinating care for Port-A-Cath placement

## 2015-08-15 NOTE — Sedation Documentation (Signed)
MD at bedside. 

## 2015-08-16 ENCOUNTER — Ambulatory Visit
Admission: RE | Admit: 2015-08-16 | Discharge: 2015-08-16 | Disposition: A | Discharge status: Home / Self Care | Payer: 59 | Source: Ambulatory Visit | Attending: Oncology | Admitting: Oncology

## 2015-08-16 ENCOUNTER — Encounter: Payer: Self-pay | Admitting: *Deleted

## 2015-08-16 DIAGNOSIS — C50412 Malignant neoplasm of upper-outer quadrant of left female breast: Secondary | ICD-10-CM

## 2015-08-16 MED ORDER — GADOBENATE DIMEGLUMINE 529 MG/ML IV SOLN
13.0000 mL | Freq: Once | INTRAVENOUS | Status: AC | PRN
  Administered 2015-08-16: 13 mL via INTRAVENOUS

## 2015-08-16 NOTE — Progress Notes (Signed)
Clinical Social Work [place] Psychosocial Distress Screening [place]  Patient completed distress screening protocol and scored a 2 on the Psychosocial Distress Thermometer which indicates mild distress. Clinical Social Worker met with patient and patients family in [facility] to assess for distress and other psychosocial needs. Patient stated she was doing "ok" and felt comfortable with her treatment plan and treatment team. CSW and patient discussed common feeling and emotions when being diagnosed with cancer, and the importance of support during treatment. CSW informed patient of the support team and support services at [facility], and patient was agreeable to an alight guide. CSW provided contact information and encouraged patient to call with any questions or concerns.  ONCBCN DISTRESS SCREENING 08/16/2015  Screening Type Initial Screening  Distress experienced in past week (1-10) 2  Emotional problem type Nervousness/Anxiety  Physical Problem type Pain;Sleep/insomnia  Physician notified of physical symptoms Yes  Referral to clinical psychology No  Referral to clinical social work No  Referral to dietition No  Referral to financial advocate No  Referral to support programs Yes  Referral to palliative care No   Johnnye Lana, MSW, LCSW, OSW-C Clinical Social Worker [place] [phone]

## 2015-08-18 ENCOUNTER — Other Ambulatory Visit: Payer: Self-pay | Admitting: Oncology

## 2015-08-24 ENCOUNTER — Ambulatory Visit (HOSPITAL_BASED_OUTPATIENT_CLINIC_OR_DEPARTMENT_OTHER): Payer: 59

## 2015-08-24 ENCOUNTER — Encounter: Payer: Self-pay | Admitting: Nurse Practitioner

## 2015-08-24 ENCOUNTER — Other Ambulatory Visit: Payer: Self-pay | Admitting: *Deleted

## 2015-08-24 ENCOUNTER — Telehealth: Payer: Self-pay | Admitting: Nurse Practitioner

## 2015-08-24 ENCOUNTER — Ambulatory Visit (HOSPITAL_BASED_OUTPATIENT_CLINIC_OR_DEPARTMENT_OTHER): Payer: 59 | Admitting: Nurse Practitioner

## 2015-08-24 VITALS — BP 139/75 | HR 103 | Temp 98.0°F | Resp 18 | Ht 63.0 in | Wt 152.2 lb

## 2015-08-24 DIAGNOSIS — C773 Secondary and unspecified malignant neoplasm of axilla and upper limb lymph nodes: Secondary | ICD-10-CM

## 2015-08-24 DIAGNOSIS — C50412 Malignant neoplasm of upper-outer quadrant of left female breast: Secondary | ICD-10-CM

## 2015-08-24 DIAGNOSIS — Z170 Estrogen receptor positive status [ER+]: Secondary | ICD-10-CM

## 2015-08-24 LAB — CBC WITH DIFFERENTIAL/PLATELET
BASO%: 0.4 % (ref 0.0–2.0)
BASOS ABS: 0 10*3/uL (ref 0.0–0.1)
EOS ABS: 0.3 10*3/uL (ref 0.0–0.5)
EOS%: 4.9 % (ref 0.0–7.0)
HEMATOCRIT: 37.5 % (ref 34.8–46.6)
HEMOGLOBIN: 12.5 g/dL (ref 11.6–15.9)
LYMPH#: 1.9 10*3/uL (ref 0.9–3.3)
LYMPH%: 31.5 % (ref 14.0–49.7)
MCH: 30.6 pg (ref 25.1–34.0)
MCHC: 33.3 g/dL (ref 31.5–36.0)
MCV: 92.1 fL (ref 79.5–101.0)
MONO#: 0.5 10*3/uL (ref 0.1–0.9)
MONO%: 8.1 % (ref 0.0–14.0)
NEUT%: 55.1 % (ref 38.4–76.8)
NEUTROS ABS: 3.2 10*3/uL (ref 1.5–6.5)
Platelets: 355 10*3/uL (ref 145–400)
RBC: 4.08 10*6/uL (ref 3.70–5.45)
RDW: 13.2 % (ref 11.2–14.5)
WBC: 5.9 10*3/uL (ref 3.9–10.3)

## 2015-08-24 LAB — COMPREHENSIVE METABOLIC PANEL
ALBUMIN: 4 g/dL (ref 3.5–5.0)
ALK PHOS: 45 U/L (ref 40–150)
ALT: 27 U/L (ref 0–55)
ANION GAP: 9 meq/L (ref 3–11)
AST: 30 U/L (ref 5–34)
BUN: 21.2 mg/dL (ref 7.0–26.0)
CALCIUM: 9.8 mg/dL (ref 8.4–10.4)
CO2: 23 mEq/L (ref 22–29)
CREATININE: 0.9 mg/dL (ref 0.6–1.1)
Chloride: 108 mEq/L (ref 98–109)
EGFR: 75 mL/min/{1.73_m2} — AB (ref >90)
Glucose: 98 mg/dl (ref 70–140)
Potassium: 4.3 mEq/L (ref 3.5–5.1)
Sodium: 140 mEq/L (ref 136–145)
TOTAL PROTEIN: 7.7 g/dL (ref 6.4–8.3)

## 2015-08-24 MED ORDER — LORAZEPAM 0.5 MG PO TABS: 0.5000 mg | ORAL_TABLET | Freq: Every day | ORAL | Status: DC

## 2015-08-24 MED ORDER — ONDANSETRON HCL 8 MG PO TABS: 8.0000 mg | ORAL_TABLET | Freq: Two times a day (BID) | ORAL | Status: DC | PRN

## 2015-08-24 MED ORDER — PROCHLORPERAZINE MALEATE 10 MG PO TABS: 10.0000 mg | ORAL_TABLET | Freq: Four times a day (QID) | ORAL | Status: DC | PRN

## 2015-08-24 MED ORDER — LIDOCAINE-PRILOCAINE 2.5-2.5 % EX CREA: TOPICAL_CREAM | CUTANEOUS | Status: DC

## 2015-08-24 MED ORDER — DEXAMETHASONE 4 MG PO TABS: ORAL_TABLET | ORAL | Status: DC

## 2015-08-24 NOTE — Progress Notes (Signed)
[place]  [phone] Fax:([phone]) [phone]     ID: JAYLNN ULLERY DOB: 10-09-1960  MR#: [id number]  XNA#:[id number]  Patient Care Team: [name], MD as PCP - General (Internal Medicine) Erroll Luna, MD as Consulting Physician (General Surgery) Chauncey Cruel, MD as Consulting Physician (Oncology) [name], MD as Consulting Physician (Radiation Oncology) Aloha Gell, MD as Consulting Physician (Obstetrics and Gynecology) Harriett Sine, MD as Consulting Physician (Dermatology) PCP: [name], MD OTHER MD:  CHIEF COMPLAINT: Estrogen receptor positive breast cancer  CURRENT TREATMENT: Neoadjuvant chemotherapy  BREAST CANCER HISTORY: Suann had routine screening mammography with tomography [facility] February 20 11/14/2014 showing a possible mass in the right breast. Diagnostic right mammography with right breast ultrasonography 11/01/2014 found the breast density to be category C. In the upper outer quadrant of the right breast there was a partially obscured mass which on physical exam was palpable as an area of thickening at the 10:00 position 8 cm from the nipple. Ultrasound showed multiple cysts in the upper outer right breast corresponding to the mass in question.  In November 2016 the patient felt a change in the upper left breast and brought it to her primary care physician's attention. Diagnostic left mammography with tomosynthesis and left breast ultrasonography at [facility] December 01/14/2015 found trabecular thickening throughout the left breast with skin thickening, but no circumscribed mass or suspicious calcifications. A rounded mass in the left axilla measured 6 mm. There was an additional mildly prominent lymph node in the left axilla which was what the patient had been palpating. Ultrasonography showed ill-defined swelling throughout the inner left breast with overlying skin thickening.. At the 2:00  position 4 cm from the nipple there was an irregular hypoechoic mass measuring 2.3 cm. A second mass at the 1:00 area 3 cm from the nipple measured 1 cm. The distance between these 2 masses was 1.8 cm. There was also a round hypoechoic left axillary mass measuring 0.6 cm. Overall the was an area of 3.7 cm of mixed echogenicity corresponding to the area of palpable soft tissue swelling.   On 08/04/2015 the patient underwent biopsy of the mass at the 2:00 axis 4 cm from the nipple and a second biopsy was performed of the hypoechoic mass at the 1:00 position 3 cm from the nipple. Biopsy of the suspicious nodule in the lower left axilla was attempted but could not be completed as the mass became obscured after administration of lidocaine.  The pathology from both left breast biopsies 08/04/2015 ([id number]) showed invasive ductal carcinoma, grade 3. The prognostic profile obtained from the larger tumor showed estrogen receptor to be strongly positive at 95%, progesterone receptor positive at 30%, with an MIB-1 of 12%, and no HER-2 amplification, the signals ratio being 1.22 and the number per cell 2.20.  On 08/09/2015 the patient underwent biopsy of this suspicious left axillary lymph node noted above, and this showed ([id number]) metastatic carcinoma with extracapsular extension.  The patient's subsequent history is as detailed below  INTERVAL HISTORY: Loan returns for follow up of her left breast cancer, accompanied by her husband, [name]. She is to begin neoadjuvant treatment consisting of cyclophosphamide and doxorubicin x 4, with neulasta onpro given on day 2 for granulocyte support.   REVIEW OF SYSTEMS: Windy is anxious about beginning treatment. She has not slept well in the past week despite 174m trazodone and 3051mgabapentin QHS. She denies fevers, chills, nausea, or vomiting. She takes  miralax daily for constipation. She has pain and swelling to the left breast. Her energy level is low.  She denies shortness of breath, chest pain, cough, or palpitations but does have an irregular heartbeat occasionally. She has regular headaches, but denies vision changes or dizziness. A detailed review of systems is otherwise stable.  PAST MEDICAL HISTORY: Past Medical History  Diagnosis Date  . Hypertension   . Raynaud's disease   . Skin cancer   . Rectovaginal fistula     repaired 2014  . Osteopenia determined by x-ray     PAST SURGICAL HISTORY: Past Surgical History  Procedure Laterality Date  . C-section    . Abdominal hysterectomy      with BSO  . Appendectomy    . Colon resection    . Hernia repair      FAMILY HISTORY Family History  Problem Relation Age of Onset  . Lung cancer Father    the patient's father died from lung cancer at the age of 5 in the setting of tobacco abuse. The patient's mother died at the age of 66 with pancreatic cancer. The patient had no siblings. There is no history of breast or ovarian cancer in the family.  GYNECOLOGIC HISTORY:  No LMP recorded. Patient has had a hysterectomy. Menarche age 54, first live birth age 54. The patient is GX P1. She is status post total hysterectomy with bilateral salpingo-oophorectomy. She has been on hormone replacement since that time and is tapering to off at the time of this dictation.   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 She used to work at [employer]. Her husband [name] works for [employer] division at a [employer] their son [name] is currently at home.    ADVANCED DIRECTIVES: Not in place   HEALTH MAINTENANCE: Social History  Substance Use Topics  . Smoking status: Former Smoker -- 1.00 packs/day    Types: Cigarettes    Quit date: 11/25/2008  . Smokeless tobacco: Not on file  . Alcohol Use: No     Colonoscopy: April 2014/ Raynaldo Opitz at [facility]  PAP: s/p hysterectomy  Bone density: August 2016/ osteopenia  Lipid panel:  Allergies  Allergen Reactions  .  Sulfa Antibiotics Other (See Comments)    Blisters in mouth    Current Outpatient Prescriptions  Medication Sig Dispense Refill  . cetirizine (ZYRTEC) 10 MG tablet Take 10 mg by mouth daily.    . Cholecalciferol (VITAMIN D3) 2000 UNITS capsule Take 2,000 Units by mouth daily.     Marland Kitchen dextroamphetamine (DEXEDRINE SPANSULE) 15 MG 24 hr capsule Take 15 mg by mouth 2 (two) times daily.     . Fenofibric Acid 105 MG TABS Take 1 tablet by mouth daily.    . fluticasone (FLONASE) 50 MCG/ACT nasal spray Place 2 sprays into both nostrils daily.    Marland Kitchen gabapentin (NEURONTIN) 300 MG capsule Take 1 capsule (300 mg total) by mouth at bedtime. 90 capsule 4  . Icosapent Ethyl (VASCEPA) 1 G CAPS Take 2 capsules by mouth 2 (two) times daily.    . Multiple Vitamin (MULTIVITAMIN) tablet Take 1 tablet by mouth daily.    Marland Kitchen NIFEdipine (PROCARDIA-XL/ADALAT-CC/NIFEDICAL-XL) 30 MG 24 hr tablet Take 30 mg by mouth daily.     Marland Kitchen olmesartan (BENICAR) 40 MG tablet Take 40 mg by mouth daily.    . polyethylene glycol (MIRALAX / GLYCOLAX) packet Take 17 g by mouth daily.    . Probiotic Product (PROBIOTIC DAILY PO) Take  1 capsule by mouth daily.     . traZODone (DESYREL) 150 MG tablet Take 150 mg by mouth at bedtime.    Marland Kitchen dexamethasone (DECADRON) 4 MG tablet Take 2 tablets by mouth once a day on the day after chemotherapy and then take 2 tablets two times a day for 2 days. Take with food. 30 tablet 1  . lidocaine-prilocaine (EMLA) cream Apply to affected area once 30 g 3  . LORazepam (ATIVAN) 0.5 MG tablet Take 1 tablet (0.5 mg total) by mouth at bedtime. 30 tablet 0  . ondansetron (ZOFRAN) 8 MG tablet Take 1 tablet (8 mg total) by mouth 2 (two) times daily as needed. Start on the third day after chemotherapy. 30 tablet 1  . prochlorperazine (COMPAZINE) 10 MG tablet Take 1 tablet (10 mg total) by mouth every 6 (six) hours as needed (Nausea or vomiting). 30 tablet 1   No current facility-administered medications for this visit.     OBJECTIVE: Middle-aged white woman who appears stated age  55 Vitals:   08/24/15 1311  BP: 139/75  Pulse: 103  Temp: 98 F (36.7 C)  Resp: 18     Body mass index is 26.97 kg/(m^2).    ECOG FS:1 - Symptomatic but completely ambulatory  Skin: warm, dry  HEENT: sclerae anicteric, conjunctivae pink, oropharynx clear. No thrush or mucositis.  Lymph Nodes: No cervical or supraclavicular lymphadenopathy  Lungs: clear to auscultation bilaterally, no rales, wheezes, or rhonci  Heart: regular rate and rhythm  Abdomen: round, soft, non tender, positive bowel sounds  Musculoskeletal: No focal spinal tenderness, no peripheral edema  Neuro: non focal, well oriented, positive affect  Breasts: deferred  LAB RESULTS:  CMP     Component Value Date/Time   NA 137 08/10/2015 0854   K 4.4 08/10/2015 0854   CO2 21* 08/10/2015 0854   GLUCOSE 104 08/10/2015 0854   BUN 23.1 08/10/2015 0854   CREATININE 0.9 08/10/2015 0854   CALCIUM 9.8 08/10/2015 0854   PROT 7.7 08/10/2015 0854   ALBUMIN 4.1 08/10/2015 0854   AST 43* 08/10/2015 0854   ALT 44 08/10/2015 0854   ALKPHOS 37* 08/10/2015 0854   BILITOT 0.35 08/10/2015 0854    INo results found for: SPEP, UPEP  Lab Results  Component Value Date   WBC 5.9 08/15/2015   NEUTROABS 2.6 08/15/2015   HGB 12.2 08/15/2015   HCT 36.8 08/15/2015   MCV 94.6 08/15/2015   PLT 403* 08/15/2015      Chemistry      Component Value Date/Time   NA 137 08/10/2015 0854   K 4.4 08/10/2015 0854   CO2 21* 08/10/2015 0854   BUN 23.1 08/10/2015 0854   CREATININE 0.9 08/10/2015 0854      Component Value Date/Time   CALCIUM 9.8 08/10/2015 0854   ALKPHOS 37* 08/10/2015 0854   AST 43* 08/10/2015 0854   ALT 44 08/10/2015 0854   BILITOT 0.35 08/10/2015 0854       No results found for: LABCA2  No components found for: [id number]  No results for input(s): INR in the last 168 hours.  Urinalysis No results found for: COLORURINE, APPEARANCEUR, LABSPEC,  PHURINE, GLUCOSEU, HGBUR, BILIRUBINUR, KETONESUR, PROTEINUR, UROBILINOGEN, NITRITE, LEUKOCYTESUR  STUDIES: Ct Chest W Contrast  08/12/2015  CLINICAL DATA:  54 year old female with newly diagnosed left-sided breast cancer on 08/10/2015. EXAM: CT CHEST WITH CONTRAST TECHNIQUE: Multidetector CT imaging of the chest was performed during intravenous contrast administration. CONTRAST:  44m OMNIPAQUE IOHEXOL 300 MG/ML  SOLN  COMPARISON:  Chest CT 11/12/2006. FINDINGS: Mediastinum/Lymph Nodes: Heart size is normal. There is no significant pericardial fluid, thickening or pericardial calcification. There is atherosclerosis of the thoracic aorta, the great vessels of the mediastinum and the coronary arteries, including calcified atherosclerotic plaque in the distal left main and proximal left anterior descending coronary arteries. No pathologically enlarged mediastinal, internal mammary or hilar lymph nodes. Esophagus is unremarkable in appearance. No axillary lymphadenopathy. Lungs/Pleura: No suspicious appearing pulmonary nodules or masses. No acute consolidative airspace disease. No pleural effusions. Upper Abdomen: Bilateral adrenal glands are visualized and are normal in appearance. Other structures are unremarkable. Musculoskeletal/Soft Tissues: Asymmetric skin thickening in the left breast. No discrete left breast mass is confidently identified on today's CT examination. Chronic compression fracture of L1 with approximately 15% loss of anterior vertebral body height, unchanged compared to 2008. There are no aggressive appearing lytic or blastic lesions noted in the visualized portions of the skeleton. IMPRESSION: 1. Asymmetric skin thickening in the left breast. No discrete breast mass or left axillary adenopathy is identified on today's CT examination. 2. No findings to suggest metastatic disease in the thorax. 3. Atherosclerosis, including left main and left anterior descending coronary artery disease. Please  note that although the presence of coronary artery calcium documents the presence of coronary artery disease, the severity of this disease and any potential stenosis cannot be assessed on this non-gated CT examination. Assessment for potential risk factor modification, dietary therapy or pharmacologic therapy may be warranted, if clinically indicated. Electronically Signed   By: Vinnie Langton M.D.   On: 08/12/2015 09:32   Nm Bone Scan Whole Body  08/12/2015  CLINICAL DATA:  Breast cancer of upper outer quadrant of left female breast. EXAM: NUCLEAR MEDICINE WHOLE BODY BONE SCAN TECHNIQUE: Whole body anterior and posterior images were obtained approximately 3 hours after intravenous injection of radiopharmaceutical. RADIOPHARMACEUTICALS:  25.4 mCi Technetium-66mMDP IV COMPARISON:  None. FINDINGS: Small focus of abnormal uptake is seen to the right of the cervical spine most consistent with degenerative change. No other areas of abnormal uptake are noted. IMPRESSION: No definite evidence of osseous metastases. Electronically Signed   By: JMarijo Conception M.D.   On: 08/12/2015 11:21   Mr Breast Bilateral W Wo Contrast  08/16/2015  CLINICAL DATA:  54year old female who initially presented with thickening in the upper left breast and left axillary swelling. The patient went underwent ultrasound-guided biopsy on 08/04/2015 at 1 o'clock and at 2 o'clock in the left breast with pathology revealing grade 3 invasive ductal carcinoma with DCIS. A left axillary lymph node was also sampled on 08/09/2015 demonstrating metastatic carcinoma involvement. This MRI is for evaluation of disease extent. LABS:  Most recent lab values were obtained on 08/10/2015 with creatinine of 0.9 mg/dL and GFR of 68. EXAM: BILATERAL BREAST MRI WITH AND WITHOUT CONTRAST TECHNIQUE: Multiplanar, multisequence MR images of both breasts were obtained prior to and following the intravenous administration of 13 ml of MultiHance.  THREE-DIMENSIONAL MR IMAGE RENDERING ON INDEPENDENT WORKSTATION: Three-dimensional MR images were rendered by post-processing of the original MR data on an independent workstation. The three-dimensional MR images were interpreted, and findings are reported in the following complete MRI report for this study. Three dimensional images were evaluated at the independent DynaCad workstation COMPARISON:  No prior MRI available for comparison. Correlation made with prior mammograms and ultrasounds. FINDINGS: Breast composition: c. Heterogeneous fibroglandular tissue. Background parenchymal enhancement: Moderate. Right breast: A right chest Port-A-Cath is in place in the upper  medial right breast. Small hematoma is seen just anterior to the chest port. No mass or abnormal enhancement is identified in the right breast. Left breast: Two foci of susceptibility are seen in the upper-outer left breast, corresponding to the biopsied sites of malignancy. There is highly suspicious diffuse heterogeneous non mass enhancement throughout the left breast occupying the majority of the upper outer and upper inner quadrants, and extending into the lower outer and lower inner quadrants as well. This enhancement measures approximately 7.7 x 6.1 cm in the axial plane, and 7.7 cm in the sagittal plane. There is diffuse skin thickening and edema throughout the left breast. There is no evidence of skin, nipple or pectoralis muscle enhancement. Lymph nodes: There are at least 2 abnormal appearing left axillary lymph nodes, 1 of which demonstrates internal susceptibility, consistent with the biopsied positive lymph node. No right axillary or bilateral internal mammary lymphadenopathy is identified. Ancillary findings:  None. IMPRESSION: 1. Diffuse abnormal enhancement involving all 4 quadrants of the left breast, highly suspicious for multicentric disease. 2. The known biopsied proven metastatic lymph node in the left axilla is identified. There  is one other adjacent lymph node which also appears borderline abnormal. 3.  No MRI evidence of malignancy in the right breast. RECOMMENDATION: If documentation of multicentric disease will change management, an MRI guided biopsy of a distance site of enhancement in a separate quadrant may be performed. BI-RADS CATEGORY  6:  Known malignancy. Electronically Signed   By: Ammie Ferrier M.D.   On: 08/16/2015 13:06   Ir Fluoro Guide Cv Line Right  08/15/2015  CLINICAL DATA:  Breast carcinoma, needs venous access for chemotherapy. EXAM: TUNNELED PORT CATHETER PLACEMENT WITH ULTRASOUND AND FLUOROSCOPIC GUIDANCE FLUOROSCOPY TIME:  6 seconds ANESTHESIA/SEDATION: Intravenous Fentanyl and Versed were administered as conscious sedation during continuous cardiorespiratory monitoring by the radiology RN, with a total moderate sedation time of 20 minutes. TECHNIQUE: The procedure, risks, benefits, and alternatives were explained to the patient. Questions regarding the procedure were encouraged and answered. The patient understands and consents to the procedure. As antibiotic prophylaxis, cefazolin was ordered pre-procedure and administered intravenously within one hour of incision. Patency of the right IJ vein was confirmed with ultrasound with image documentation. An appropriate skin site was determined. Skin site was marked. Region was prepped using maximum barrier technique including cap and mask, sterile gown, sterile gloves, large sterile sheet, and Chlorhexidine as cutaneous antisepsis. The region was infiltrated locally with 1% lidocaine. Under real-time ultrasound guidance, the right IJ vein was accessed with a 21 gauge micropuncture needle; the needle tip within the vein was confirmed with ultrasound image documentation. Needle was exchanged over a 018 guidewire for transitional dilator which allowed passage of the [facility] wire into the IVC. Over this, the transitional dilator was exchanged for a 5 [place] MPA  catheter. A small incision was made on the right anterior chest wall and a subcutaneous pocket fashioned. The power-injectable port was positioned and its catheter tunneled to the right IJ dermatotomy site. The MPA catheter was exchanged over an Amplatz wire for a peel-away sheath, through which the port catheter, which had been trimmed to the appropriate length, was advanced and positioned under fluoroscopy with its tip at the cavoatrial junction. Spot chest radiograph confirms good catheter position and no pneumothorax. The pocket was closed with deep interrupted and subcuticular continuous 3-0 Monocryl sutures. The port was flushed per protocol. The incisions were covered with Dermabond then covered with a sterile dressing. COMPLICATIONS: COMPLICATIONS None  immediate IMPRESSION: Technically successful right IJ power-injectable port catheter placement. Ready for routine use. Electronically Signed   By: Lucrezia Europe M.D.   On: 08/15/2015 10:02   Ir US Guide Vasc Access Right  08/15/2015  CLINICAL DATA:  Breast carcinoma, needs venous access for chemotherapy. EXAM: TUNNELED PORT CATHETER PLACEMENT WITH ULTRASOUND AND FLUOROSCOPIC GUIDANCE FLUOROSCOPY TIME:  6 seconds ANESTHESIA/SEDATION: Intravenous Fentanyl and Versed were administered as conscious sedation during continuous cardiorespiratory monitoring by the radiology RN, with a total moderate sedation time of 20 minutes. TECHNIQUE: The procedure, risks, benefits, and alternatives were explained to the patient. Questions regarding the procedure were encouraged and answered. The patient understands and consents to the procedure. As antibiotic prophylaxis, cefazolin was ordered pre-procedure and administered intravenously within one hour of incision. Patency of the right IJ vein was confirmed with ultrasound with image documentation. An appropriate skin site was determined. Skin site was marked. Region was prepped using maximum barrier technique including cap and  mask, sterile gown, sterile gloves, large sterile sheet, and Chlorhexidine as cutaneous antisepsis. The region was infiltrated locally with 1% lidocaine. Under real-time ultrasound guidance, the right IJ vein was accessed with a 21 gauge micropuncture needle; the needle tip within the vein was confirmed with ultrasound image documentation. Needle was exchanged over a 018 guidewire for transitional dilator which allowed passage of the [facility] wire into the IVC. Over this, the transitional dilator was exchanged for a 5 [place] MPA catheter. A small incision was made on the right anterior chest wall and a subcutaneous pocket fashioned. The power-injectable port was positioned and its catheter tunneled to the right IJ dermatotomy site. The MPA catheter was exchanged over an Amplatz wire for a peel-away sheath, through which the port catheter, which had been trimmed to the appropriate length, was advanced and positioned under fluoroscopy with its tip at the cavoatrial junction. Spot chest radiograph confirms good catheter position and no pneumothorax. The pocket was closed with deep interrupted and subcuticular continuous 3-0 Monocryl sutures. The port was flushed per protocol. The incisions were covered with Dermabond then covered with a sterile dressing. COMPLICATIONS: COMPLICATIONS None immediate IMPRESSION: Technically successful right IJ power-injectable port catheter placement. Ready for routine use. Electronically Signed   By: Lucrezia Europe M.D.   On: 08/15/2015 10:02   Mm Digital Diagnostic Unilat L  08/04/2015  CLINICAL DATA:  Status post ultrasound-guided biopsy of 2 suspicious masses in the left breast. EXAM: DIAGNOSTIC LEFT MAMMOGRAM POST ULTRASOUND BIOPSY COMPARISON:  Previous exam(s). FINDINGS: Mammographic images were obtained following ultrasound guided biopsy of suspicious masses at the 1 o'clock axis and 2 o'clock axis of the left breast. At the conclusion of each biopsy, tissue markers were placed at  each biopsy site. Both of these clips appeared well-positioned by ultrasound and are adequately positioned on this postprocedure mammogram. IMPRESSION: Postprocedure mammogram for clip placement. Two biopsy clips are well-positioned at their respective biopsy sites. Final Assessment: Post Procedure Mammograms for Marker Placement Electronically Signed   By: Franki Cabot M.D.   On: 08/04/2015 16:13   US Breast Ltd Uni Left Inc Axilla  08/01/2015  CLINICAL DATA:  Patient describes thickening/hardness within the upper left breast, noticed only recently. Patient also describes swelling in the left axilla. Ordering physician describes left breast skin thickening and palpable mass in the upper outer quadrant. EXAM: DIGITAL DIAGNOSTIC LEFT MAMMOGRAM WITH 3D TOMOSYNTHESIS WITH CAD ULTRASOUND LEFT BREAST COMPARISON:  Previous exams including most recent screening mammogram of 10/19/2014 ACR Breast Density Category c:  The breast tissue is heterogeneously dense, which may obscure small masses. FINDINGS: Left breast CC and MLO projections were obtained with 3D tomosynthesis. Additional spot compression tangential views of the left axilla, corresponding to the area of soft tissue swelling, was obtained with overlying skin marker in place. There is trabecular thickening throughout the left breast and skin thickening. No circumscribed mass or suspicious calcifications identified within the left breast. A round mass within the left axilla measures 6 mm greatest dimension. There is an additional mildly prominent lymph node within the left axilla with surrounding trabecular thickening/edema, with overlying skin marker demonstrating this to be the site of patient's swelling. Mammographic images were processed with CAD. Targeted ultrasound is performed, showing ill-defined edema throughout the inner left breast and overlying skin thickening. There is also vague shadowing throughout the upper left breast. There is an irregular  hypoechoic area at the 2 o'clock axis, 4 cm from the nipple, with internal vascularity, measuring 2.3 x 1.4 x 0.8 cm. There is an additional hypoechoic mass within the left breast at the 1 o'clock axis, 3 cm from the nipple, with indistinct margins and anti-parallel orientation, measuring 1 x 0.8 x 1 cm. There is a round hypoechoic mass within the left axilla, measuring 0.6 x 0.4 x 0.5 cm. There is an additional morphologically normal lymph node within the left axilla, however, the surrounding soft tissues are heterogeneous (mixed-echogenicity), measuring 3.7 x 1.2 x 3.2 cm, corresponding to the area of palpable soft tissue swelling and mammographic finding. IMPRESSION: 1. Findings highly suggestive of either mastitis or inflammatory breast cancer, favor inflammatory breast cancer, with ill-defined edema throughout a significant portion of the left breast and overlying skin thickening. 2. Suspicious irregular hypoechoic area at the 2 o'clock axis, 4 cm from the nipple, measuring 2.3 x 1.4 x 0.8 cm, for which ultrasound-guided core biopsy is recommended. 3. Suspicious round hypoechoic mass within the left axilla, measuring 0.6 x 0.4 cm, corresponding to the mammographic finding, most likely an abnormal lymph node, for which ultrasound-guided core biopsy is also recommended. 4. Additional hypoechoic mass within the left breast at the 1 o'clock axis, 3 cm from the nipple, with indistinct margins and anti-parallel orientation, measuring 1 x 0.8 x 1 cm. This is also a suspicious finding suggesting multifocal disease. 5. There is an additional morphologically normal lymph node within the left axilla, however, the surrounding soft tissues are heterogeneous (mixed-echogenicity), measuring 3.7 x 1.2 x 3.2 cm, corresponding to the area of palpable soft tissue swelling and mammographic finding, most likely related to soft tissue edema although infiltrative neoplastic process cannot be excluded. RECOMMENDATION: 1.  Ultrasound-guided core biopsy of the suspicious irregular hypoechoic area at the 2 o'clock axis, 4 cm from the nipple, measuring 2.3 x 1.4 x 0.8 cm. 2. Ultrasound-guided core biopsy of the suspicious round hypoechoic mass within the left axilla, measuring 0.6 x 0.4 cm, suspected to be an abnormal lymph node. Ultrasound-guided biopsies are scheduled for Thursday, 08/04/2015. If ultrasound-guided biopsies are negative, a skin punch biopsy will likely be needed. If ultrasound-guided biopsies are positive, breast MRI will likely be needed given the high likelihood of multicentric disease. Findings and considerations were discussed with patient's nurse, Jonelle Sidle, on 08/01/2015 at 4:50 p.m. I have discussed the findings and recommendations with the patient. Results were also provided in writing at the conclusion of the visit. If applicable, a reminder letter will be sent to the patient regarding the next appointment. BI-RADS CATEGORY  4: Suspicious. Electronically Signed   By:  Franki Cabot M.D.   On: 08/01/2015 17:18   Mm Diag Breast Tomo Uni Left  08/01/2015  CLINICAL DATA:  Patient describes thickening/hardness within the upper left breast, noticed only recently. Patient also describes swelling in the left axilla. Ordering physician describes left breast skin thickening and palpable mass in the upper outer quadrant. EXAM: DIGITAL DIAGNOSTIC LEFT MAMMOGRAM WITH 3D TOMOSYNTHESIS WITH CAD ULTRASOUND LEFT BREAST COMPARISON:  Previous exams including most recent screening mammogram of 10/19/2014 ACR Breast Density Category c: The breast tissue is heterogeneously dense, which may obscure small masses. FINDINGS: Left breast CC and MLO projections were obtained with 3D tomosynthesis. Additional spot compression tangential views of the left axilla, corresponding to the area of soft tissue swelling, was obtained with overlying skin marker in place. There is trabecular thickening throughout the left breast and skin thickening.  No circumscribed mass or suspicious calcifications identified within the left breast. A round mass within the left axilla measures 6 mm greatest dimension. There is an additional mildly prominent lymph node within the left axilla with surrounding trabecular thickening/edema, with overlying skin marker demonstrating this to be the site of patient's swelling. Mammographic images were processed with CAD. Targeted ultrasound is performed, showing ill-defined edema throughout the inner left breast and overlying skin thickening. There is also vague shadowing throughout the upper left breast. There is an irregular hypoechoic area at the 2 o'clock axis, 4 cm from the nipple, with internal vascularity, measuring 2.3 x 1.4 x 0.8 cm. There is an additional hypoechoic mass within the left breast at the 1 o'clock axis, 3 cm from the nipple, with indistinct margins and anti-parallel orientation, measuring 1 x 0.8 x 1 cm. There is a round hypoechoic mass within the left axilla, measuring 0.6 x 0.4 x 0.5 cm. There is an additional morphologically normal lymph node within the left axilla, however, the surrounding soft tissues are heterogeneous (mixed-echogenicity), measuring 3.7 x 1.2 x 3.2 cm, corresponding to the area of palpable soft tissue swelling and mammographic finding. IMPRESSION: 1. Findings highly suggestive of either mastitis or inflammatory breast cancer, favor inflammatory breast cancer, with ill-defined edema throughout a significant portion of the left breast and overlying skin thickening. 2. Suspicious irregular hypoechoic area at the 2 o'clock axis, 4 cm from the nipple, measuring 2.3 x 1.4 x 0.8 cm, for which ultrasound-guided core biopsy is recommended. 3. Suspicious round hypoechoic mass within the left axilla, measuring 0.6 x 0.4 cm, corresponding to the mammographic finding, most likely an abnormal lymph node, for which ultrasound-guided core biopsy is also recommended. 4. Additional hypoechoic mass within the  left breast at the 1 o'clock axis, 3 cm from the nipple, with indistinct margins and anti-parallel orientation, measuring 1 x 0.8 x 1 cm. This is also a suspicious finding suggesting multifocal disease. 5. There is an additional morphologically normal lymph node within the left axilla, however, the surrounding soft tissues are heterogeneous (mixed-echogenicity), measuring 3.7 x 1.2 x 3.2 cm, corresponding to the area of palpable soft tissue swelling and mammographic finding, most likely related to soft tissue edema although infiltrative neoplastic process cannot be excluded. RECOMMENDATION: 1. Ultrasound-guided core biopsy of the suspicious irregular hypoechoic area at the 2 o'clock axis, 4 cm from the nipple, measuring 2.3 x 1.4 x 0.8 cm. 2. Ultrasound-guided core biopsy of the suspicious round hypoechoic mass within the left axilla, measuring 0.6 x 0.4 cm, suspected to be an abnormal lymph node. Ultrasound-guided biopsies are scheduled for Thursday, 08/04/2015. If ultrasound-guided biopsies are negative, a  skin punch biopsy will likely be needed. If ultrasound-guided biopsies are positive, breast MRI will likely be needed given the high likelihood of multicentric disease. Findings and considerations were discussed with patient's nurse, Jonelle Sidle, on 08/01/2015 at 4:50 p.m. I have discussed the findings and recommendations with the patient. Results were also provided in writing at the conclusion of the visit. If applicable, a reminder letter will be sent to the patient regarding the next appointment. BI-RADS CATEGORY  4: Suspicious. Electronically Signed   By: Franki Cabot M.D.   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 This was found to be concordant by Dr. Everlean Alstrom. Pathology was called to Bary Castilla, RN, Nurse Navigator at [facility]. The patient is being seen today at [facility]. Dr. Erroll Luna will notify the patient of her results. Pathology results reported by Susa Raring RN, BSN on August 10, 2015. Electronically Signed   By: Everlean Alstrom M.D.   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 EXAM: ULTRASOUND GUIDED CORE NEEDLE BIOPSY OF A LEFT AXILLARY NODE COMPARISON:  Previous exam(s). FINDINGS: I met with the patient and we discussed the procedure of ultrasound-guided biopsy, including benefits and alternatives. We discussed the high likelihood of a successful procedure. We discussed the risks of the procedure, including infection, bleeding, tissue injury, clip migration, and inadequate sampling. Informed written consent was given. The usual time-out protocol was performed immediately prior to the procedure. Using sterile technique and 2% Lidocaine as local anesthetic, under direct ultrasound visualization, a 12 gauge spring-loaded device was used to perform biopsy of the round lymph node in the left axilla using a inferior to superior approach. At the conclusion of the procedure a HydroMARK tissue marker clip was deployed into the biopsy cavity, and resides adjacent to the biopsied lymph node. Follow up 2 view mammogram was performed and dictated separately. IMPRESSION: Ultrasound guided biopsy of an indeterminate round left axillary lymph node, with a HydroMARK tissue marker clip placed adjacent to the biopsied lymph node. Electronically Signed: By: Everlean Alstrom M.D. 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 This was found to be concordant by Dr. Franki Cabot. Pathology was discussed with the patient by telephone. She reported minimal bleeding from the sites on the evening of the biopsy, but has had no subsequent problems. Post biopsy instructions and care were reviewed and her questions were answered. She has a biopsy scheduled of a left axillary lymph node on August 09, 2015 and is aware of the appointment. The patient has been scheduled at [facility] on August 10, 2015. Bilateral breast MRI would be recommended to evaluate for multicentric disease, as patient has additional thickening and edema throughout the medial right breast. She was encouraged to come to [facility] for educational materials. My number was provided  for additional questions and concerns. Pathology results reported by Susa Raring RN, BSN on August 05, 2015. Electronically Signed   By: Franki Cabot M.D.   On: 08/05/2015 13:21  08/05/2015  CLINICAL DATA:  Patient with 2 masses in the upper left breast returns today for ultrasound-guided biopsies. Patient with additional suspicious nodule in the lower left axilla for ultrasound-guided biopsy. EXAM: ULTRASOUND GUIDED LEFT BREAST CORE NEEDLE BIOPSY COMPARISON:  Previous exam(s). PROCEDURE: I met with the patient and we discussed the procedure of ultrasound-guided biopsy, including benefits and alternatives. We discussed the high likelihood of a successful procedure. We discussed the risks of the procedure including infection, bleeding, tissue injury, clip migration, and inadequate sampling. Informed written consent was given. The usual time-out protocol was performed immediately prior to the procedure. Using sterile technique and 1% Lidocaine as local anesthetic, under direct ultrasound visualization, a 12 gauge spring-loaded biopsy device was used  to perform biopsy of the irregular hypoechoic mass at the 2 o'clock axis, 4 cm from the nipple,using a lateral approach. At the conclusion of the procedure, a ribbon shaped tissue marker clip was deployed into the biopsy cavity. Next, using sterile technique and 1% lidocaine as local anesthetic, under direct ultrasound visualization, a 12 gauge spring-loaded biopsy device was used to perform biopsy of the oval hypoechoic mass with anti-parallel orientation and indistinct margins located in the left breast at the 1 o'clock axis, 3 cm from the nipple, using a lateral approach. Today, this mass appeared more cystic but did not completely collapse during the biopsy. At the conclusion of the procedure, a coil shaped tissue marker clip was deployed into the biopsy site. Next, an attempt was made to biopsy the suspicious nodule in the lower left axilla. After administration of lidocaine, the nodule became completely obscured so biopsy was not possible. Follow-up 2-view mammogram was performed and dictated separately. IMPRESSION: Ultrasound-guided biopsy of the irregular hypoechoic mass at the 2 o'clock axis, 4 cm from the nipple. Ultrasound-guided biopsy of the cystic-appearing mass at the 1 o'clock axis, 3 cm from the nipple. No apparent complications. Electronically Signed: By: Franki Cabot M.D. 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 This was found to be concordant by Dr. Franki Cabot. Pathology was discussed with the patient by telephone. She reported minimal bleeding from the sites on the evening of the biopsy, but has had no subsequent problems. Post biopsy instructions and care were reviewed and her questions were answered. She has a biopsy scheduled of a left axillary lymph node on  August 09, 2015 and is aware of the appointment. The patient has been scheduled at [facility] on August 10, 2015. Bilateral breast MRI would be recommended to evaluate for multicentric disease, as patient has additional thickening and edema throughout the medial right breast. She was encouraged to come to [facility] for educational materials. My number was provided for additional questions and concerns. Pathology results reported by Susa Raring RN, BSN on August 05, 2015. Electronically Signed   By: Franki Cabot M.D.   On: 08/05/2015 13:21  08/05/2015  CLINICAL DATA:  Patient with 2 masses in the upper left breast returns today for ultrasound-guided biopsies. Patient with additional suspicious nodule in  the lower left axilla for ultrasound-guided biopsy. EXAM: ULTRASOUND GUIDED LEFT BREAST CORE NEEDLE BIOPSY COMPARISON:  Previous exam(s). PROCEDURE: I met with the patient and we discussed the procedure of ultrasound-guided biopsy, including benefits and alternatives. We discussed the high likelihood of a successful procedure. We discussed the risks of the procedure including infection, bleeding, tissue injury, clip migration, and inadequate sampling. Informed written consent was given. The usual time-out protocol was performed immediately prior to the procedure. Using sterile technique and 1% Lidocaine as local anesthetic, under direct ultrasound visualization, a 12 gauge spring-loaded biopsy device was used to perform biopsy of the irregular hypoechoic mass at the 2 o'clock axis, 4 cm from the nipple,using a lateral approach. At the conclusion of the procedure, a ribbon shaped tissue marker clip was deployed into the biopsy cavity. Next, using sterile technique and 1% lidocaine as local anesthetic, under direct ultrasound visualization, a 12 gauge spring-loaded biopsy device was used to perform biopsy of the oval hypoechoic mass with  anti-parallel orientation and indistinct margins located in the left breast at the 1 o'clock axis, 3 cm from the nipple, using a lateral approach. Today, this mass appeared more cystic but did not completely collapse during the biopsy. At the conclusion of the procedure, a coil shaped tissue marker clip was deployed into the biopsy site. Next, an attempt was made to biopsy the suspicious nodule in the lower left axilla. After administration of lidocaine, the nodule became completely obscured so biopsy was not possible. Follow-up 2-view mammogram was performed and dictated separately. IMPRESSION: Ultrasound-guided biopsy of the irregular hypoechoic mass at the 2 o'clock axis, 4 cm from the nipple. Ultrasound-guided biopsy of the cystic-appearing mass at the 1 o'clock axis, 3 cm from the nipple. No apparent complications. Electronically Signed: By: Franki Cabot M.D. On: 08/04/2015 16:11    ASSESSMENT: 54 y.o. [place] woman status post 2 separate masses in the left breast 08/04/2015, clinically mT2 N1, stage IIb/IIIa invasive ductal carcinoma, grade 3,, the larger mass being estrogen and progesterone receptor positive, HER-2 negative, with an MIB-1 of 12%  (a) biopsy of a left axillary lymph node 08/09/2015 was positive, with extracapsular extension  (1) neoadjuvant chemotherapy will consist of doxorubicin and cyclophosphamide in dose dense fashion 4 followed by weekly paclitaxel 12  (2) definitive surgery to follow  (3) adjuvant radiation to follow surgery  (4) anti-estrogens to follow completion of local treatment  (a) bone density August 2016 showed osteopenia  (b) the patient is status post total abdominal hysterectomy with bilateral salpingo-oophorectomy.  (5) the patient appears to be a good candidate for the [employer] trial  PLAN: Betta, her husband and I spent 60 minutes discussing her upcoming chemotherapy plans. She was made aware of potential side effects and toxicities of both  cyclophosphamide and doxorubicin. She attended chemotherapy school earlier last week so a lot of this information was a review for her. She was made aware of her neulasta injections, including the fact that these will be administered with an onbody injection that she will need to wear until the device is no longer active.   Finally, we reviewed her antiemetic schedule, and she feels comfortable with every medication listed as well as the indication and potential side effects. These prescriptions were sent to her pharmacy during today's visit. I also wrote her a prescription for a wig.    We reviewed the results of her chest CT and bone scan and these were negative for metastatic disease.   Ludmilla will proceed with  cycle 1 of treatment tomorrow. She will return in 1 week for labs and a nadir visit. She understands and agrees with this plan. She knows the goal of treatment in her case is cure. She has been encouraged to call with any issues that might arise before her next visit here.  Laurie Panda, NP   08/24/2015 3:12 PM

## 2015-08-24 NOTE — Telephone Encounter (Signed)
Appointments made and avs printed for patient °

## 2015-08-24 NOTE — Addendum Note (Signed)
Addended by: Marcelino Duster on: 08/24/2015 03:22 PM   Modules accepted: Level of Service

## 2015-08-25 ENCOUNTER — Ambulatory Visit (HOSPITAL_BASED_OUTPATIENT_CLINIC_OR_DEPARTMENT_OTHER): Payer: 59

## 2015-08-25 ENCOUNTER — Encounter: Payer: Self-pay | Admitting: *Deleted

## 2015-08-25 VITALS — BP 108/62 | HR 117 | Temp 98.2°F | Resp 18

## 2015-08-25 DIAGNOSIS — Z5111 Encounter for antineoplastic chemotherapy: Secondary | ICD-10-CM

## 2015-08-25 DIAGNOSIS — Z5189 Encounter for other specified aftercare: Secondary | ICD-10-CM

## 2015-08-25 DIAGNOSIS — C50412 Malignant neoplasm of upper-outer quadrant of left female breast: Secondary | ICD-10-CM | POA: Diagnosis not present

## 2015-08-25 MED ORDER — SODIUM CHLORIDE 0.9 % IV SOLN
Freq: Once | INTRAVENOUS | Status: AC
  Administered 2015-08-25: 08:00:00 via INTRAVENOUS

## 2015-08-25 MED ORDER — PALONOSETRON HCL INJECTION 0.25 MG/5ML
INTRAVENOUS | Status: AC
  Filled 2015-08-25: qty 5

## 2015-08-25 MED ORDER — DOXORUBICIN HCL CHEMO IV INJECTION 2 MG/ML
59.0000 mg/m2 | Freq: Once | INTRAVENOUS | Status: AC
  Administered 2015-08-25: 100 mg via INTRAVENOUS
  Filled 2015-08-25: qty 50

## 2015-08-25 MED ORDER — PALONOSETRON HCL INJECTION 0.25 MG/5ML
0.2500 mg | Freq: Once | INTRAVENOUS | Status: AC
  Administered 2015-08-25: 0.25 mg via INTRAVENOUS

## 2015-08-25 MED ORDER — PEGFILGRASTIM 6 MG/0.6ML ~~LOC~~ PSKT
6.0000 mg | PREFILLED_SYRINGE | Freq: Once | SUBCUTANEOUS | Status: AC
  Administered 2015-08-25: 6 mg via SUBCUTANEOUS
  Filled 2015-08-25: qty 0.6

## 2015-08-25 MED ORDER — HEPARIN SOD (PORK) LOCK FLUSH 100 UNIT/ML IV SOLN
500.0000 [IU] | Freq: Once | INTRAVENOUS | Status: AC | PRN
  Administered 2015-08-25: 500 [IU]
  Filled 2015-08-25: qty 5

## 2015-08-25 MED ORDER — SODIUM CHLORIDE 0.9 % IV SOLN
Freq: Once | INTRAVENOUS | Status: AC
  Administered 2015-08-25: 09:00:00 via INTRAVENOUS
  Filled 2015-08-25: qty 5

## 2015-08-25 MED ORDER — SODIUM CHLORIDE 0.9 % IJ SOLN
10.0000 mL | INTRAMUSCULAR | Status: DC | PRN
  Administered 2015-08-25: 10 mL
  Filled 2015-08-25: qty 10

## 2015-08-25 MED ORDER — SODIUM CHLORIDE 0.9 % IV SOLN
590.0000 mg/m2 | Freq: Once | INTRAVENOUS | Status: AC
  Administered 2015-08-25: 1000 mg via INTRAVENOUS
  Filled 2015-08-25: qty 50

## 2015-08-25 NOTE — Patient Instructions (Addendum)
[facility] Discharge Instructions for Patients Receiving Chemotherapy  Today you received the following chemotherapy agents: Adriamycin and Cytoxan   To help prevent nausea and vomiting after your treatment, we encourage you to take your nausea medication as directed. Avoid Zofran for the next three days.    If you develop nausea and vomiting that is not controlled by your nausea medication, call the clinic.   BELOW ARE SYMPTOMS THAT SHOULD BE REPORTED IMMEDIATELY:  *FEVER GREATER THAN 100.5 F  *CHILLS WITH OR WITHOUT FEVER  NAUSEA AND VOMITING THAT IS NOT CONTROLLED WITH YOUR NAUSEA MEDICATION  *UNUSUAL SHORTNESS OF BREATH  *UNUSUAL BRUISING OR BLEEDING  TENDERNESS IN MOUTH AND THROAT WITH OR WITHOUT PRESENCE OF ULCERS  *URINARY PROBLEMS  *BOWEL PROBLEMS  UNUSUAL RASH Items with * indicate a potential emergency and should be followed up as soon as possible.  Feel free to call the clinic you have any questions or concerns. The clinic phone number is [phone].  Please show the [facility] at check-in to the Emergency Department and triage nurse.   Doxorubicin injection What is this medicine? DOXORUBICIN (dox oh ROO bi sin) is a chemotherapy drug. It is used to treat many kinds of cancer like Hodgkin's disease, leukemia, non-Hodgkin's lymphoma, neuroblastoma, sarcoma, and Wilms' tumor. It is also used to treat bladder cancer, breast cancer, lung cancer, ovarian cancer, stomach cancer, and thyroid cancer. This medicine may be used for other purposes; ask your health care provider or pharmacist if you have questions. What should I tell my health care provider before I take this medicine? They need to know if you have any of these conditions: -blood disorders -heart disease, recent heart attack -infection (especially a virus infection such as chickenpox, cold sores, or herpes) -irregular heartbeat -liver disease -recent or ongoing radiation  therapy -an unusual or allergic reaction to doxorubicin, other chemotherapy agents, other medicines, foods, dyes, or preservatives -pregnant or trying to get pregnant -breast-feeding How should I use this medicine? This drug is given as an infusion into a vein. It is administered in a hospital or clinic by a specially trained health care professional. If you have pain, swelling, burning or any unusual feeling around the site of your injection, tell your health care professional right away. Talk to your pediatrician regarding the use of this medicine in children. Special care may be needed. Overdosage: If you think you have taken too much of this medicine contact a poison control center or emergency room at once. NOTE: This medicine is only for you. Do not share this medicine with others. What if I miss a dose? It is important not to miss your dose. Call your doctor or health care professional if you are unable to keep an appointment. What may interact with this medicine? Do not take this medicine with any of the following medications: -cisapride -droperidol -halofantrine -pimozide -zidovudine This medicine may also interact with the following medications: -chloroquine -chlorpromazine -clarithromycin -cyclophosphamide -cyclosporine -erythromycin -medicines for depression, anxiety, or psychotic disturbances -medicines for irregular heart beat like amiodarone, bepridil, dofetilide, encainide, flecainide, propafenone, quinidine -medicines for seizures like ethotoin, fosphenytoin, phenytoin -medicines for nausea, vomiting like dolasetron, ondansetron, palonosetron -medicines to increase blood counts like filgrastim, pegfilgrastim, sargramostim -methadone -methotrexate -pentamidine -progesterone -vaccines -verapamil Talk to your doctor or health care professional before taking any of these medicines: -acetaminophen -aspirin -ibuprofen -ketoprofen -naproxen This list may not  describe all possible interactions. Give your health care provider a list of all the medicines,  herbs, non-prescription drugs, or dietary supplements you use. Also tell them if you smoke, drink alcohol, or use illegal drugs. Some items may interact with your medicine. What should I watch for while using this medicine? Your condition will be monitored carefully while you are receiving this medicine. You will need important blood work done while you are taking this medicine. This drug may make you feel generally unwell. This is not uncommon, as chemotherapy can affect healthy cells as well as cancer cells. Report any side effects. Continue your course of treatment even though you feel ill unless your doctor tells you to stop. Your urine may turn red for a few days after your dose. This is not blood. If your urine is dark or brown, call your doctor. In some cases, you may be given additional medicines to help with side effects. Follow all directions for their use. Call your doctor or health care professional for advice if you get a fever, chills or sore throat, or other symptoms of a cold or flu. Do not treat yourself. This drug decreases your body's ability to fight infections. Try to avoid being around people who are sick. This medicine may increase your risk to bruise or bleed. Call your doctor or health care professional if you notice any unusual bleeding. Be careful brushing and flossing your teeth or using a toothpick because you may get an infection or bleed more easily. If you have any dental work done, tell your dentist you are receiving this medicine. Avoid taking products that contain aspirin, acetaminophen, ibuprofen, naproxen, or ketoprofen unless instructed by your doctor. These medicines may hide a fever. Men and women of childbearing age should use effective birth control methods while using taking this medicine. Do not become pregnant while taking this medicine. There is a potential for  serious side effects to an unborn child. Talk to your health care professional or pharmacist for more information. Do not breast-feed an infant while taking this medicine. Do not let others touch your urine or other body fluids for 5 days after each treatment with this medicine. Caregivers should wear latex gloves to avoid touching body fluids during this time. There is a maximum amount of this medicine you should receive throughout your life. The amount depends on the medical condition being treated and your overall health. Your doctor will watch how much of this medicine you receive in your lifetime. Tell your doctor if you have taken this medicine before. What side effects may I notice from receiving this medicine? Side effects that you should report to your doctor or health care professional as soon as possible: -allergic reactions like skin rash, itching or hives, swelling of the face, lips, or tongue -low blood counts - this medicine may decrease the number of white blood cells, red blood cells and platelets. You may be at increased risk for infections and bleeding. -signs of infection - fever or chills, cough, sore throat, pain or difficulty passing urine -signs of decreased platelets or bleeding - bruising, pinpoint red spots on the skin, black, tarry stools, blood in the urine -signs of decreased red blood cells - unusually weak or tired, fainting spells, lightheadedness -breathing problems -chest pain -fast, irregular heartbeat -mouth sores -nausea, vomiting -pain, swelling, redness at site where injected -pain, tingling, numbness in the hands or feet -swelling of ankles, feet, or hands -unusual bleeding or bruising Side effects that usually do not require medical attention (report to your doctor or health care professional if they continue or  are bothersome): -diarrhea -facial flushing -hair loss -loss of appetite -missed menstrual periods -nail discoloration or damage -red or  watery eyes -red colored urine -stomach upset This list may not describe all possible side effects. Call your doctor for medical advice about side effects. You may report side effects to FDA at 1-800-FDA-1088. Where should I keep my medicine? This drug is given in a hospital or clinic and will not be stored at home. NOTE: This sheet is a summary. It may not cover all possible information. If you have questions about this medicine, talk to your doctor, pharmacist, or health care provider.    2016, [employer]. (2012-12-09 09:54:34)   Cyclophosphamide injection What is this medicine? CYCLOPHOSPHAMIDE (sye kloe FOSS fa mide) is a chemotherapy drug. It slows the growth of cancer cells. This medicine is used to treat many types of cancer like lymphoma, myeloma, leukemia, breast cancer, and ovarian cancer, to name a few. This medicine may be used for other purposes; ask your health care provider or pharmacist if you have questions. What should I tell my health care provider before I take this medicine? They need to know if you have any of these conditions: -blood disorders -history of other chemotherapy -infection -kidney disease -liver disease -recent or ongoing radiation therapy -tumors in the bone marrow -an unusual or allergic reaction to cyclophosphamide, other chemotherapy, other medicines, foods, dyes, or preservatives -pregnant or trying to get pregnant -breast-feeding How should I use this medicine? This drug is usually given as an injection into a vein or muscle or by infusion into a vein. It is administered in a hospital or clinic by a specially trained health care professional. Talk to your pediatrician regarding the use of this medicine in children. Special care may be needed. Overdosage: If you think you have taken too much of this medicine contact a poison control center or emergency room at once. NOTE: This medicine is only for you. Do not share this medicine with  others. What if I miss a dose? It is important not to miss your dose. Call your doctor or health care professional if you are unable to keep an appointment. What may interact with this medicine? This medicine may interact with the following medications: -amiodarone -amphotericin B -azathioprine -certain antiviral medicines for HIV or AIDS such as protease inhibitors (e.g., indinavir, ritonavir) and zidovudine -certain blood pressure medications such as benazepril, captopril, enalapril, fosinopril, lisinopril, moexipril, monopril, perindopril, quinapril, ramipril, trandolapril -certain cancer medications such as anthracyclines (e.g., daunorubicin, doxorubicin), busulfan, cytarabine, paclitaxel, pentostatin, tamoxifen, trastuzumab -certain diuretics such as chlorothiazide, chlorthalidone, hydrochlorothiazide, indapamide, metolazone -certain medicines that treat or prevent blood clots like warfarin -certain muscle relaxants such as succinylcholine -cyclosporine -etanercept -indomethacin -medicines to increase blood counts like filgrastim, pegfilgrastim, sargramostim -medicines used as general anesthesia -metronidazole -natalizumab This list may not describe all possible interactions. Give your health care provider a list of all the medicines, herbs, non-prescription drugs, or dietary supplements you use. Also tell them if you smoke, drink alcohol, or use illegal drugs. Some items may interact with your medicine. What should I watch for while using this medicine? Visit your doctor for checks on your progress. This drug may make you feel generally unwell. This is not uncommon, as chemotherapy can affect healthy cells as well as cancer cells. Report any side effects. Continue your course of treatment even though you feel ill unless your doctor tells you to stop. Drink water or other fluids as directed. Urinate often, even at night. In  some cases, you may be given additional medicines to help with  side effects. Follow all directions for their use. Call your doctor or health care professional for advice if you get a fever, chills or sore throat, or other symptoms of a cold or flu. Do not treat yourself. This drug decreases your body's ability to fight infections. Try to avoid being around people who are sick. This medicine may increase your risk to bruise or bleed. Call your doctor or health care professional if you notice any unusual bleeding. Be careful brushing and flossing your teeth or using a toothpick because you may get an infection or bleed more easily. If you have any dental work done, tell your dentist you are receiving this medicine. You may get drowsy or dizzy. Do not drive, use machinery, or do anything that needs mental alertness until you know how this medicine affects you. Do not become pregnant while taking this medicine or for 1 year after stopping it. Women should inform their doctor if they wish to become pregnant or think they might be pregnant. Men should not father a child while taking this medicine and for 4 months after stopping it. There is a potential for serious side effects to an unborn child. Talk to your health care professional or pharmacist for more information. Do not breast-feed an infant while taking this medicine. This medicine may interfere with the ability to have a child. This medicine has caused ovarian failure in some women. This medicine has caused reduced sperm counts in some men. You should talk with your doctor or health care professional if you are concerned about your fertility. If you are going to have surgery, tell your doctor or health care professional that you have taken this medicine. What side effects may I notice from receiving this medicine? Side effects that you should report to your doctor or health care professional as soon as possible: -allergic reactions like skin rash, itching or hives, swelling of the face, lips, or tongue -low blood  counts - this medicine may decrease the number of white blood cells, red blood cells and platelets. You may be at increased risk for infections and bleeding. -signs of infection - fever or chills, cough, sore throat, pain or difficulty passing urine -signs of decreased platelets or bleeding - bruising, pinpoint red spots on the skin, black, tarry stools, blood in the urine -signs of decreased red blood cells - unusually weak or tired, fainting spells, lightheadedness -breathing problems -dark urine -dizziness -palpitations -swelling of the ankles, feet, hands -trouble passing urine or change in the amount of urine -weight gain -yellowing of the eyes or skin Side effects that usually do not require medical attention (report to your doctor or health care professional if they continue or are bothersome): -changes in nail or skin color -hair loss -missed menstrual periods -mouth sores -nausea, vomiting This list may not describe all possible side effects. Call your doctor for medical advice about side effects. You may report side effects to FDA at 1-800-FDA-1088. Where should I keep my medicine? This drug is given in a hospital or clinic and will not be stored at home. NOTE: This sheet is a summary. It may not cover all possible information. If you have questions about this medicine, talk to your doctor, pharmacist, or health care provider.    2016, [employer]. (2012-06-27 16:22:58)

## 2015-08-25 NOTE — Progress Notes (Signed)
0810: HR 117. Okay to treat per Dr. Lindi Adie.

## 2015-08-26 ENCOUNTER — Ambulatory Visit: Payer: 59

## 2015-09-01 ENCOUNTER — Other Ambulatory Visit (HOSPITAL_BASED_OUTPATIENT_CLINIC_OR_DEPARTMENT_OTHER): Payer: 59

## 2015-09-01 ENCOUNTER — Ambulatory Visit (HOSPITAL_BASED_OUTPATIENT_CLINIC_OR_DEPARTMENT_OTHER): Payer: 59 | Admitting: Nurse Practitioner

## 2015-09-01 ENCOUNTER — Encounter: Payer: Self-pay | Admitting: Nurse Practitioner

## 2015-09-01 VITALS — BP 124/62 | HR 86 | Temp 98.3°F | Resp 18 | Ht 63.0 in | Wt 145.9 lb

## 2015-09-01 DIAGNOSIS — C50412 Malignant neoplasm of upper-outer quadrant of left female breast: Secondary | ICD-10-CM

## 2015-09-01 DIAGNOSIS — R530 Neoplastic (malignant) related fatigue: Secondary | ICD-10-CM

## 2015-09-01 DIAGNOSIS — R1084 Generalized abdominal pain: Secondary | ICD-10-CM | POA: Diagnosis not present

## 2015-09-01 DIAGNOSIS — G4700 Insomnia, unspecified: Secondary | ICD-10-CM | POA: Diagnosis not present

## 2015-09-01 LAB — COMPREHENSIVE METABOLIC PANEL
ALBUMIN: 3.7 g/dL (ref 3.5–5.0)
ALK PHOS: 65 U/L (ref 40–150)
ALT: 22 U/L (ref 0–55)
AST: 24 U/L (ref 5–34)
Anion Gap: 9 mEq/L (ref 3–11)
BUN: 13.7 mg/dL (ref 7.0–26.0)
CALCIUM: 9.6 mg/dL (ref 8.4–10.4)
CO2: 21 mEq/L — ABNORMAL LOW (ref 22–29)
CREATININE: 0.9 mg/dL (ref 0.6–1.1)
Chloride: 106 mEq/L (ref 98–109)
EGFR: 76 mL/min/{1.73_m2} — ABNORMAL LOW (ref >90)
GLUCOSE: 101 mg/dL (ref 70–140)
Potassium: 4 mEq/L (ref 3.5–5.1)
Sodium: 136 mEq/L (ref 136–145)
Total Bilirubin: <0.3 mg/dL (ref 0.20–1.20)
Total Protein: 6.8 g/dL (ref 6.4–8.3)

## 2015-09-01 LAB — CBC WITH DIFFERENTIAL/PLATELET
BASO%: 0.9 % (ref 0.0–2.0)
Basophils Absolute: 0.1 10*3/uL (ref 0.0–0.1)
EOS%: 3.9 % (ref 0.0–7.0)
Eosinophils Absolute: 0.3 10*3/uL (ref 0.0–0.5)
HEMATOCRIT: 35.9 % (ref 34.8–46.6)
HEMOGLOBIN: 11.7 g/dL (ref 11.6–15.9)
LYMPH%: 21.7 % (ref 14.0–49.7)
MCH: 30.2 pg (ref 25.1–34.0)
MCHC: 32.5 g/dL (ref 31.5–36.0)
MCV: 92.8 fL (ref 79.5–101.0)
MONO#: 1.7 10*3/uL — AB (ref 0.1–0.9)
MONO%: 19.3 % — AB (ref 0.0–14.0)
NEUT%: 54.2 % (ref 38.4–76.8)
NEUTROS ABS: 4.8 10*3/uL (ref 1.5–6.5)
Platelets: 219 10*3/uL (ref 145–400)
RBC: 3.87 10*6/uL (ref 3.70–5.45)
RDW: 13.7 % (ref 11.2–14.5)
WBC: 8.9 10*3/uL (ref 3.9–10.3)
lymph#: 1.9 10*3/uL (ref 0.9–3.3)

## 2015-09-01 MED ORDER — OMEPRAZOLE 40 MG PO CPDR: 40.0000 mg | DELAYED_RELEASE_CAPSULE | Freq: Every day | ORAL | Status: DC

## 2015-09-01 NOTE — Progress Notes (Signed)
Why  [phone]) [phone] Fax:(336) [phone]     ID: DELISSA SILBA DOB: Aug 24, 1961  MR#: [id number]  QXI#:[id number]  Patient Care Team: [name], MD as PCP - General (Internal Medicine) Erroll Luna, MD as Consulting Physician (General Surgery) Chauncey Cruel, MD as Consulting Physician (Oncology) [name], MD as Consulting Physician (Radiation Oncology) Aloha Gell, MD as Consulting Physician (Obstetrics and Gynecology) Harriett Sine, MD as Consulting Physician (Dermatology) PCP: [name], MD OTHER MD:  CHIEF COMPLAINT: Estrogen receptor positive breast cancer  CURRENT TREATMENT: Neoadjuvant chemotherapy  BREAST CANCER HISTORY: Joycelyn had routine screening mammography [facility] February 20 11/14/2014 showing a possible mass in the right breast. Diagnostic right mammography with right breast ultrasonography 11/01/2014 found the breast density to be category C. In the upper outer quadrant of the right breast there was a partially obscured mass which on physical exam was palpable as an area of thickening at the 10:00 position 8 cm from the nipple. Ultrasound showed multiple cysts in the upper outer right breast corresponding to the mass in question.  In November 2016 the patient felt a change in the upper left breast and brought it to her primary care physician's attention. Diagnostic left mammography with tomosynthesis and left breast ultrasonography at [facility] December 01/14/2015 found trabecular thickening throughout the left breast with skin thickening, but no circumscribed mass or suspicious calcifications. A rounded mass in the left axilla measured 6 mm. There was an additional mildly prominent lymph node in the left axilla which was what the patient had been palpating. Ultrasonography showed ill-defined swelling throughout the inner left breast with overlying skin thickening.. At the 2:00  position 4 cm from the nipple there was an irregular hypoechoic mass measuring 2.3 cm. A second mass at the 1:00 area 3 cm from the nipple measured 1 cm. The distance between these 2 masses was 1.8 cm. There was also a round hypoechoic left axillary mass measuring 0.6 cm. Overall the was an area of 3.7 cm of mixed echogenicity corresponding to the area of palpable soft tissue swelling.   On 08/04/2015 the patient underwent biopsy of the mass at the 2:00 axis 4 cm from the nipple and a second biopsy was performed of the hypoechoic mass at the 1:00 position 3 cm from the nipple. Biopsy of the suspicious nodule in the lower left axilla was attempted but could not be completed as the mass became obscured after administration of lidocaine.  The pathology from both left breast biopsies 08/04/2015 ([id number]) showed invasive ductal carcinoma, grade 3. The prognostic profile obtained from the larger tumor showed estrogen receptor to be strongly positive at 95%, progesterone receptor positive at 30%, with an MIB-1 of 12%, and no HER-2 amplification, the signals ratio being 1.22 and the number per cell 2.20.  On 08/09/2015 the patient underwent biopsy of this suspicious left axillary lymph node noted above, and this showed ([id number]) metastatic carcinoma with extracapsular extension.  The patient's subsequent history is as detailed below  INTERVAL HISTORY: Zamorah returns for follow up of her left breast cancer, alone. Today is day 8, cycle 1 of cyclophosphamide and doxorubicin x 4, with neulasta onpro given on day 2 for granulocyte support.   REVIEW OF SYSTEMS: Goldie feels wiped out this week. The fatigue took her by surprise. Unfortunately, her insomnia has barely improved. She sleeps about 3 hours at a time, but is awake for long stretches after midnight. She is  on trazodone 148m, melatonin, and 3036mgabapentin QHS. She started taking 2 of the 0.18m23mablets of lorazepam and this was helpful, but  lately that is not as helpful. Her main complaint today is abdominal pain and burning. Her abdomen is distended and mildly tender. She is taking dulcolax and miralax daily, and is having soft bowel movements, but they are small in quantity. Her nausea is intermittent, but still present through today. Compazine is helpful but it makes her "loopy." She had some palpitations right after treatment but these resolved. She denies shortness of breath, chest pain, or cough. She has no mouth sores or rashes. Her left middle and pinky toe are tingling. A detailed review of systems is otherwise stable.   PAST MEDICAL HISTORY: Past Medical History  Diagnosis Date  . Hypertension   . Raynaud's disease   . Skin cancer   . Rectovaginal fistula     repaired 2014  . Osteopenia determined by x-ray     PAST SURGICAL HISTORY: Past Surgical History  Procedure Laterality Date  . C-section    . Abdominal hysterectomy      with BSO  . Appendectomy    . Colon resection    . Hernia repair      FAMILY HISTORY Family History  Problem Relation Age of Onset  . Lung cancer Father    the patient's father died from lung cancer at the age of 68 75 the setting of tobacco abuse. The patient's mother died at the age of 69 77th pancreatic cancer. The patient had no siblings. There is no history of breast or ovarian cancer in the family.  GYNECOLOGIC HISTORY:  No LMP recorded. Patient has had a hysterectomy. Menarche age [age]irst live birth age [age]he patient is GX P1. She is status post total hysterectomy with bilateral salpingo-oophorectomy. She has been on hormone replacement since that time and is tapering to off at the time of this dictation.   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    ADVANCED DIRECTIVES: Not in place   HEALTH  MAINTENANCE: Social History  Substance Use Topics  . Smoking status: Former Smoker -- 1.00 packs/day    Types: Cigarettes    Quit date: 11/25/2008  . Smokeless tobacco: Not on file  . Alcohol Use: No     [facility]  Bone density: August 2016/ osteopenia  Lipid panel:  Allergies  Allergen Reactions  . Sulfa Antibiotics Other (See Comments)    Blisters in mouth    Current Outpatient Prescriptions  Medication Sig Dispense Refill  . cetirizine (ZYRTEC) 10 MG tablet Take 10 mg by mouth daily.    . Cholecalciferol (VITAMIN D3) 2000 UNITS capsule Take 2,000 Units by mouth daily.     . dMarland Kitchenxamethasone (DECADRON) 4 MG tablet Take 2 tablets by mouth once a day on the day after chemotherapy and then take 2 tablets two times a day for 2 days. Take with food. 30 tablet 1  . dextroamphetamine (DEXEDRINE SPANSULE) 15 MG 24 hr capsule Take 15 mg by mouth 2 (two) times daily.     . Fenofibric Acid 105 MG TABS Take 1 tablet by mouth daily.    . gMarland Kitchenbapentin (NEURONTIN) 300 MG capsule Take 1 capsule (300 mg total) by mouth at bedtime. 90Hermosa  capsule 4  . Icosapent Ethyl (VASCEPA) 1 G CAPS Take 2 capsules by mouth 2 (two) times daily.    Marland Kitchen lidocaine-prilocaine (EMLA) cream Apply to affected area once 30 g 3  . LORazepam (ATIVAN) 0.5 MG tablet Take 1 tablet (0.5 mg total) by mouth at bedtime. 30 tablet 0  . Multiple Vitamin (MULTIVITAMIN) tablet Take 1 tablet by mouth daily.    Marland Kitchen NIFEdipine (PROCARDIA-XL/ADALAT-CC/NIFEDICAL-XL) 30 MG 24 hr tablet Take 30 mg by mouth daily.     Marland Kitchen olmesartan (BENICAR) 40 MG tablet Take 40 mg by mouth daily.    . ondansetron (ZOFRAN) 8 MG tablet Take 1 tablet (8 mg total) by mouth 2 (two) times daily as needed. Start on the third day after chemotherapy. 30 tablet 1  . polyethylene glycol (MIRALAX / GLYCOLAX) packet Take 17 g by mouth daily.    . Probiotic Product (PROBIOTIC DAILY PO) Take 1 capsule by mouth daily.     .  prochlorperazine (COMPAZINE) 10 MG tablet Take 1 tablet (10 mg total) by mouth every 6 (six) hours as needed (Nausea or vomiting). 30 tablet 1  . traZODone (DESYREL) 150 MG tablet Take 150 mg by mouth at bedtime.    . fluticasone (FLONASE) 50 MCG/ACT nasal spray Place 2 sprays into both nostrils daily. Reported on 09/01/2015    . omeprazole (PRILOSEC) 40 MG capsule Take 1 capsule (40 mg total) by mouth daily. 30 capsule 2   No current facility-administered medications for this visit.    OBJECTIVE: Middle-aged white woman who appears stated age  55 Vitals:   09/01/15 1425  BP: 124/62  Pulse: 86  Temp: 98.3 F (36.8 C)  Resp: 18     Body mass index is 25.85 kg/(m^2).    ECOG FS:1 - Symptomatic but completely ambulatory Sclerae unicteric, pupils round and equal Oropharynx clear and moist-- no thrush or other lesions No cervical or supraclavicular adenopathy Lungs no rales or rhonchi Heart regular rate and rhythm Abd distended, tender with palpation positive bowel sounds MSK no focal spinal tenderness, no upper extremity lymphedema Neuro: nonfocal, well oriented, appropriate affect Breasts: deferred  LAB RESULTS:  CMP     Component Value Date/Time   NA 136 09/01/2015 1410   K 4.0 09/01/2015 1410   CO2 21* 09/01/2015 1410   GLUCOSE 101 09/01/2015 1410   BUN 13.7 09/01/2015 1410   CREATININE 0.9 09/01/2015 1410   CALCIUM 9.6 09/01/2015 1410   PROT 6.8 09/01/2015 1410   ALBUMIN 3.7 09/01/2015 1410   AST 24 09/01/2015 1410   ALT 22 09/01/2015 1410   ALKPHOS 65 09/01/2015 1410   BILITOT <0.30 09/01/2015 1410    INo results found for: SPEP, UPEP  Lab Results  Component Value Date   WBC 8.9 09/01/2015   NEUTROABS 4.8 09/01/2015   HGB 11.7 09/01/2015   HCT 35.9 09/01/2015   MCV 92.8 09/01/2015   PLT 219 09/01/2015      Chemistry      Component Value Date/Time   NA 136 09/01/2015 1410   K 4.0 09/01/2015 1410   CO2 21* 09/01/2015 1410   BUN 13.7 09/01/2015 1410    CREATININE 0.9 09/01/2015 1410      Component Value Date/Time   CALCIUM 9.6 09/01/2015 1410   ALKPHOS 65 09/01/2015 1410   AST 24 09/01/2015 1410   ALT 22 09/01/2015 1410   BILITOT <0.30 09/01/2015 1410       No results found for: LABCA2  No components found for: [id number]  No results for input(s): INR in the last 168 hours.  Urinalysis No results found for: COLORURINE, APPEARANCEUR, LABSPEC, PHURINE, GLUCOSEU, HGBUR, BILIRUBINUR, KETONESUR, PROTEINUR, UROBILINOGEN, NITRITE, LEUKOCYTESUR  STUDIES: Ct Chest W Contrast  08/12/2015  CLINICAL DATA:  55 year old female with newly diagnosed left-sided breast cancer on 08/10/2015. EXAM: CT CHEST WITH CONTRAST TECHNIQUE: Multidetector CT imaging of the chest was performed during intravenous contrast administration. CONTRAST:  57m OMNIPAQUE IOHEXOL 300 MG/ML  SOLN COMPARISON:  Chest CT 11/12/2006. FINDINGS: Mediastinum/Lymph Nodes: Heart size is normal. There is no significant pericardial fluid, thickening or pericardial calcification. There is atherosclerosis of the thoracic aorta, the great vessels of the mediastinum and the coronary arteries, including calcified atherosclerotic plaque in the distal left main and proximal left anterior descending coronary arteries. No pathologically enlarged mediastinal, internal mammary or hilar lymph nodes. Esophagus is unremarkable in appearance. No axillary lymphadenopathy. Lungs/Pleura: No suspicious appearing pulmonary nodules or masses. No acute consolidative airspace disease. No pleural effusions. Upper Abdomen: Bilateral adrenal glands are visualized and are normal in appearance. Other structures are unremarkable. Musculoskeletal/Soft Tissues: Asymmetric skin thickening in the left breast. No discrete left breast mass is confidently identified on today's CT examination. Chronic compression fracture of L1 with approximately 15% loss of anterior vertebral body height, unchanged compared to 2008. There are no  aggressive appearing lytic or blastic lesions noted in the visualized portions of the skeleton. IMPRESSION: 1. Asymmetric skin thickening in the left breast. No discrete breast mass or left axillary adenopathy is identified on today's CT examination. 2. No findings to suggest metastatic disease in the thorax. 3. Atherosclerosis, including left main and left anterior descending coronary artery disease. Please note that although the presence of coronary artery calcium documents the presence of coronary artery disease, the severity of this disease and any potential stenosis cannot be assessed on this non-gated CT examination. Assessment for potential risk factor modification, dietary therapy or pharmacologic therapy may be warranted, if clinically indicated. Electronically Signed   By: DVinnie LangtonM.D.   On: 08/12/2015 09:32   Nm Bone Scan Whole Body  08/12/2015  CLINICAL DATA:  Breast cancer of upper outer quadrant of left female breast. EXAM: NUCLEAR MEDICINE WHOLE BODY BONE SCAN TECHNIQUE: Whole body anterior and posterior images were obtained approximately 3 hours after intravenous injection of radiopharmaceutical. RADIOPHARMACEUTICALS:  25.4 mCi Technetium-945mDP IV COMPARISON:  None. FINDINGS: Small focus of abnormal uptake is seen to the right of the cervical spine most consistent with degenerative change. No other areas of abnormal uptake are noted. IMPRESSION: No definite evidence of osseous metastases. Electronically Signed   By: JaMarijo ConceptionM.D.   On: 08/12/2015 11:21   Mr Breast Bilateral W Wo Contrast  08/16/2015  CLINICAL DATA:  [age]ear old female who initially presented with thickening in the upper left breast and left axillary swelling. The patient went underwent ultrasound-guided biopsy on 08/04/2015 at 1 o'clock and at 2 o'clock in the left breast with pathology revealing grade 3 invasive ductal carcinoma with DCIS. A left axillary lymph node was also sampled on 08/09/2015  demonstrating metastatic carcinoma involvement. This MRI is for evaluation of disease extent. LABS:  Most recent lab values were obtained on 08/10/2015 with creatinine of 0.9 mg/dL and GFR of 68. EXAM: BILATERAL BREAST MRI WITH AND WITHOUT CONTRAST TECHNIQUE: Multiplanar, multisequence MR images of both breasts were obtained prior to and following the intravenous administration of 13 ml of MultiHance. THREE-DIMENSIONAL MR IMAGE RENDERING ON INDEPENDENT WORKSTATION: Three-dimensional MR images were rendered by  post-processing of the original MR data on an independent workstation. The three-dimensional MR images were interpreted, and findings are reported in the following complete MRI report for this study. Three dimensional images were evaluated at the independent DynaCad workstation COMPARISON:  No prior MRI available for comparison. Correlation made with prior mammograms and ultrasounds. FINDINGS: Breast composition: c. Heterogeneous fibroglandular tissue. Background parenchymal enhancement: Moderate. Right breast: A right chest Port-A-Cath is in place in the upper medial right breast. Small hematoma is seen just anterior to the chest port. No mass or abnormal enhancement is identified in the right breast. Left breast: Two foci of susceptibility are seen in the upper-outer left breast, corresponding to the biopsied sites of malignancy. There is highly suspicious diffuse heterogeneous non mass enhancement throughout the left breast occupying the majority of the upper outer and upper inner quadrants, and extending into the lower outer and lower inner quadrants as well. This enhancement measures approximately 7.7 x 6.1 cm in the axial plane, and 7.7 cm in the sagittal plane. There is diffuse skin thickening and edema throughout the left breast. There is no evidence of skin, nipple or pectoralis muscle enhancement. Lymph nodes: There are at least 2 abnormal appearing left axillary lymph nodes, 1 of which demonstrates  internal susceptibility, consistent with the biopsied positive lymph node. No right axillary or bilateral internal mammary lymphadenopathy is identified. Ancillary findings:  None. IMPRESSION: 1. Diffuse abnormal enhancement involving all 4 quadrants of the left breast, highly suspicious for multicentric disease. 2. The known biopsied proven metastatic lymph node in the left axilla is identified. There is one other adjacent lymph node which also appears borderline abnormal. 3.  No MRI evidence of malignancy in the right breast. RECOMMENDATION: If documentation of multicentric disease will change management, an MRI guided biopsy of a distance site of enhancement in a separate quadrant may be performed. BI-RADS CATEGORY  6:  Known malignancy. Electronically Signed   By: Ammie Ferrier M.D.   On: 08/16/2015 13:06   Ir Fluoro Guide Cv Line Right  08/15/2015  CLINICAL DATA:  Breast carcinoma, needs venous access for chemotherapy. EXAM: TUNNELED PORT CATHETER PLACEMENT WITH ULTRASOUND AND FLUOROSCOPIC GUIDANCE FLUOROSCOPY TIME:  6 seconds ANESTHESIA/SEDATION: Intravenous Fentanyl and Versed were administered as conscious sedation during continuous cardiorespiratory monitoring by the radiology RN, with a total moderate sedation time of 20 minutes. TECHNIQUE: The procedure, risks, benefits, and alternatives were explained to the patient. Questions regarding the procedure were encouraged and answered. The patient understands and consents to the procedure. As antibiotic prophylaxis, cefazolin was ordered pre-procedure and administered intravenously within one hour of incision. Patency of the right IJ vein was confirmed with ultrasound with image documentation. An appropriate skin site was determined. Skin site was marked. Region was prepped using maximum barrier technique including cap and mask, sterile gown, sterile gloves, large sterile sheet, and Chlorhexidine as cutaneous antisepsis. The region was infiltrated  locally with 1% lidocaine. Under real-time ultrasound guidance, the right IJ vein was accessed with a 21 gauge micropuncture needle; the needle tip within the vein was confirmed with ultrasound image documentation. Needle was exchanged over a 018 guidewire for transitional dilator which allowed passage of the [facility] wire into the IVC. Over this, the transitional dilator was exchanged for a 5 [place] MPA catheter. A small incision was made on the right anterior chest wall and a subcutaneous pocket fashioned. The power-injectable port was positioned and its catheter tunneled to the right IJ dermatotomy site. The MPA catheter was exchanged over  an Amplatz wire for a peel-away sheath, through which the port catheter, which had been trimmed to the appropriate length, was advanced and positioned under fluoroscopy with its tip at the cavoatrial junction. Spot chest radiograph confirms good catheter position and no pneumothorax. The pocket was closed with deep interrupted and subcuticular continuous 3-0 Monocryl sutures. The port was flushed per protocol. The incisions were covered with Dermabond then covered with a sterile dressing. COMPLICATIONS: COMPLICATIONS None immediate IMPRESSION: Technically successful right IJ power-injectable port catheter placement. Ready for routine use. Electronically Signed   By: Lucrezia Europe M.D.   On: 08/15/2015 10:02   Ir US Guide Vasc Access Right  08/15/2015  CLINICAL DATA:  Breast carcinoma, needs venous access for chemotherapy. EXAM: TUNNELED PORT CATHETER PLACEMENT WITH ULTRASOUND AND FLUOROSCOPIC GUIDANCE FLUOROSCOPY TIME:  6 seconds ANESTHESIA/SEDATION: Intravenous Fentanyl and Versed were administered as conscious sedation during continuous cardiorespiratory monitoring by the radiology RN, with a total moderate sedation time of 20 minutes. TECHNIQUE: The procedure, risks, benefits, and alternatives were explained to the patient. Questions regarding the procedure were encouraged  and answered. The patient understands and consents to the procedure. As antibiotic prophylaxis, cefazolin was ordered pre-procedure and administered intravenously within one hour of incision. Patency of the right IJ vein was confirmed with ultrasound with image documentation. An appropriate skin site was determined. Skin site was marked. Region was prepped using maximum barrier technique including cap and mask, sterile gown, sterile gloves, large sterile sheet, and Chlorhexidine as cutaneous antisepsis. The region was infiltrated locally with 1% lidocaine. Under real-time ultrasound guidance, the right IJ vein was accessed with a 21 gauge micropuncture needle; the needle tip within the vein was confirmed with ultrasound image documentation. Needle was exchanged over a 018 guidewire for transitional dilator which allowed passage of the [facility] wire into the IVC. Over this, the transitional dilator was exchanged for a 5 [place] MPA catheter. A small incision was made on the right anterior chest wall and a subcutaneous pocket fashioned. The power-injectable port was positioned and its catheter tunneled to the right IJ dermatotomy site. The MPA catheter was exchanged over an Amplatz wire for a peel-away sheath, through which the port catheter, which had been trimmed to the appropriate length, was advanced and positioned under fluoroscopy with its tip at the cavoatrial junction. Spot chest radiograph confirms good catheter position and no pneumothorax. The pocket was closed with deep interrupted and subcuticular continuous 3-0 Monocryl sutures. The port was flushed per protocol. The incisions were covered with Dermabond then covered with a sterile dressing. COMPLICATIONS: COMPLICATIONS None immediate IMPRESSION: Technically successful right IJ power-injectable port catheter placement. Ready for routine use. Electronically Signed   By: Lucrezia Europe M.D.   On: 08/15/2015 10:02   Mm Digital Diagnostic Unilat L  08/04/2015   CLINICAL DATA:  Status post ultrasound-guided biopsy of 2 suspicious masses in the left breast. EXAM: DIAGNOSTIC LEFT MAMMOGRAM POST ULTRASOUND BIOPSY COMPARISON:  Previous exam(s). FINDINGS: Mammographic images were obtained following ultrasound guided biopsy of suspicious masses at the 1 o'clock axis and 2 o'clock axis of the left breast. At the conclusion of each biopsy, tissue markers were placed at each biopsy site. Both of these clips appeared well-positioned by ultrasound and are adequately positioned on this postprocedure mammogram. IMPRESSION: Postprocedure mammogram for clip placement. Two biopsy clips are well-positioned at their respective biopsy sites. Final Assessment: Post Procedure Mammograms for Marker Placement Electronically Signed   By: Franki Cabot M.D.   On: 08/04/2015 16:13  [place] Lt Breast Bx W Loc Dev 1st Lesion Img Bx Spec US Guide  08/11/2015  ADDENDUM REPORT: 08/10/2015 10:01 ADDENDUM: Pathology revealed metastatic carcinoma in the left axillary lymph node. This was found to be concordant by Dr. Everlean Alstrom. Pathology was called to Bary Castilla, RN, Nurse Navigator at [facility]. The patient is being seen today at [facility]. Dr. Erroll Luna will notify the patient of her results. Pathology results reported by Susa Raring RN, BSN on August 10, 2015. Electronically Signed   By: Everlean Alstrom M.D.   On: 08/10/2015 10:01  08/11/2015  CLINICAL DATA:  55 year old female with recently diagnosed left breast grade 3 invasive ductal carcinoma at 2 sites, 1 o'clock and 2 o'clock, with an indeterminate round lymph node in the left axilla for which biopsy was recommended. EXAM: ULTRASOUND GUIDED CORE NEEDLE BIOPSY OF A LEFT AXILLARY NODE COMPARISON:  Previous exam(s). FINDINGS: I met with the patient and we discussed the procedure of ultrasound-guided biopsy, including benefits and alternatives. We discussed the high likelihood of a  successful procedure. We discussed the risks of the procedure, including infection, bleeding, tissue injury, clip migration, and inadequate sampling. Informed written consent was given. The usual time-out protocol was performed immediately prior to the procedure. Using sterile technique and 2% Lidocaine as local anesthetic, under direct ultrasound visualization, a 12 gauge spring-loaded device was used to perform biopsy of the round lymph node in the left axilla using a inferior to superior approach. At the conclusion of the procedure a HydroMARK tissue marker clip was deployed into the biopsy cavity, and resides adjacent to the biopsied lymph node. Follow up 2 view mammogram was performed and dictated separately. IMPRESSION: Ultrasound guided biopsy of an indeterminate round left axillary lymph node, with a HydroMARK tissue marker clip placed adjacent to the biopsied lymph node. Electronically Signed: By: Everlean Alstrom M.D. 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 This was found to be concordant by Dr. Franki Cabot. Pathology was discussed with the patient by telephone. She reported minimal bleeding from the sites on the evening of the biopsy, but has had no subsequent problems. Post biopsy instructions and care were reviewed and her questions were answered. She has a biopsy scheduled of a left axillary lymph node on August 09, 2015 and is aware of the appointment. The patient has been scheduled at [facility] on August 10, 2015. Bilateral breast MRI would be recommended to evaluate for multicentric disease, as patient has additional thickening and edema throughout the medial right breast. She was encouraged to come to [facility] [place] for educational materials. My number was provided for additional questions and concerns. Pathology results reported by Susa Raring RN, BSN on August 05, 2015. Electronically Signed   By: Franki Cabot M.D.   On: 08/05/2015 13:21  08/05/2015  CLINICAL DATA:  Patient with 2 masses in the upper left breast returns today for ultrasound-guided biopsies. Patient with additional suspicious nodule in the lower left axilla for ultrasound-guided biopsy. EXAM: ULTRASOUND GUIDED LEFT BREAST CORE NEEDLE BIOPSY COMPARISON:  Previous exam(s). PROCEDURE: I met with the patient and we discussed the procedure of ultrasound-guided biopsy, including benefits and alternatives. We discussed the  high likelihood of a successful procedure. We discussed the risks of the procedure including infection, bleeding, tissue injury, clip migration, and inadequate sampling. Informed written consent was given. The usual time-out protocol was performed immediately prior to the procedure. Using sterile technique and 1% Lidocaine as local anesthetic, under direct ultrasound visualization, a 12 gauge spring-loaded biopsy device was used to perform biopsy of the irregular hypoechoic mass at the 2 o'clock axis, 4 cm from the nipple,using a lateral approach. At the conclusion of the procedure, a ribbon shaped tissue marker clip was deployed into the biopsy cavity. Next, using sterile technique and 1% lidocaine as local anesthetic, under direct ultrasound visualization, a 12 gauge spring-loaded biopsy device was used to perform biopsy of the oval hypoechoic mass with anti-parallel orientation and indistinct margins located in the left breast at the 1 o'clock axis, 3 cm from the nipple, using a lateral approach. Today, this mass appeared more cystic but did not completely collapse during the biopsy. At the conclusion of the procedure, a coil shaped tissue marker clip was deployed into the biopsy site. Next, an attempt was made to biopsy  the suspicious nodule in the lower left axilla. After administration of lidocaine, the nodule became completely obscured so biopsy was not possible. Follow-up 2-view mammogram was performed and dictated separately. IMPRESSION: Ultrasound-guided biopsy of the irregular hypoechoic mass at the 2 o'clock axis, 4 cm from the nipple. Ultrasound-guided biopsy of the cystic-appearing mass at the 1 o'clock axis, 3 cm from the nipple. No apparent complications. Electronically Signed: By: Franki Cabot M.D. 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 This was found to be concordant by Dr. Franki Cabot. Pathology was discussed with the patient by telephone. She reported minimal bleeding from the sites on the evening of the biopsy, but has had no subsequent problems. Post biopsy instructions and care were reviewed and her questions were answered. She has a biopsy scheduled of a left axillary lymph node on August 09, 2015 and is aware of the appointment. The patient has been scheduled at [facility] on August 10, 2015. Bilateral breast MRI would be recommended to evaluate for multicentric disease, as patient has additional thickening and edema throughout the medial right breast. She was encouraged to come to [facility] [place] [facility] for educational materials. My number was provided for additional questions and concerns. Pathology results reported by Susa Raring RN, BSN on August 05, 2015. Electronically Signed   By: Franki Cabot M.D.   On: 08/05/2015 13:21  08/05/2015  CLINICAL DATA:  Patient with 2 masses in the upper left breast returns today for ultrasound-guided biopsies. Patient with additional suspicious nodule in the lower left axilla for  ultrasound-guided biopsy. EXAM: ULTRASOUND GUIDED LEFT BREAST CORE NEEDLE BIOPSY COMPARISON:  Previous exam(s). PROCEDURE: I met with the patient and we discussed the procedure of ultrasound-guided biopsy, including benefits and alternatives. We discussed the high likelihood of a successful procedure. We discussed the risks of the procedure including infection, bleeding, tissue injury, clip migration, and inadequate sampling. Informed written consent was given. The usual time-out protocol was performed immediately prior to the procedure. Using sterile technique and 1% Lidocaine as local anesthetic, under direct ultrasound visualization, a 12 gauge spring-loaded biopsy  device was used to perform biopsy of the irregular hypoechoic mass at the 2 o'clock axis, 4 cm from the nipple,using a lateral approach. At the conclusion of the procedure, a ribbon shaped tissue marker clip was deployed into the biopsy cavity. Next, using sterile technique and 1% lidocaine as local anesthetic, under direct ultrasound visualization, a 12 gauge spring-loaded biopsy device was used to perform biopsy of the oval hypoechoic mass with anti-parallel orientation and indistinct margins located in the left breast at the 1 o'clock axis, 3 cm from the nipple, using a lateral approach. Today, this mass appeared more cystic but did not completely collapse during the biopsy. At the conclusion of the procedure, a coil shaped tissue marker clip was deployed into the biopsy site. Next, an attempt was made to biopsy the suspicious nodule in the lower left axilla. After administration of lidocaine, the nodule became completely obscured so biopsy was not possible. Follow-up 2-view mammogram was performed and dictated separately. IMPRESSION: Ultrasound-guided biopsy of the irregular hypoechoic mass at the 2 o'clock axis, 4 cm from the nipple. Ultrasound-guided biopsy of the cystic-appearing mass at the 1 o'clock axis, 3 cm from the nipple. No apparent  complications. Electronically Signed: By: Franki Cabot M.D. On: 08/04/2015 16:11    ASSESSMENT: 55 y.o. [place] woman status post 2 separate masses in the left breast 08/04/2015, clinically mT2 N1, stage IIb/IIIa invasive ductal carcinoma, grade 3, the larger mass being estrogen and progesterone receptor positive, HER-2 negative, with an MIB-1 of 12%  (a) biopsy of a left axillary lymph node 08/09/2015 was positive, with extracapsular extension  (1) neoadjuvant chemotherapy will consist of doxorubicin and cyclophosphamide in dose dense fashion 4 followed by weekly paclitaxel 12  (2) definitive surgery to follow  (3) adjuvant radiation to follow surgery  (4) anti-estrogens to follow completion of local treatment  (a) bone density August 2016 showed osteopenia  (b) the patient is status post total abdominal hysterectomy with bilateral salpingo-oophorectomy.  (5) the patient appears to be a good candidate for the [employer] trial  PLAN: Joe is having a rough week. She understands that fatigue is to be expected, but her abdominal pain is troubling. I have prescribed prilosec 79m daily as this could be related to stomach acid. She will continue on OTC simethecone to help break up the gas bubbles. Fortunately, she is moving her bowel well with stool softeners and miralax.    LEilidhhas a long standing issue with insomnia, so there is no quick fix that this office can provide. She will continue on trazodone, melatonin or gabapentin. She will increase her dose of lorazepam to 158m but she understands to only use this drug on the days she is on dexamethasone, otherwise it will become ineffective as well. We discussed dropping the dose of dexamethasone, but her nausea might have been too severe to risk it.   The labs were reviewed in detail and were entirely stable.  LeLugeneill return in 1 week for cycle 2 of treatment. She understands and agrees with this plan. She knows the goal of  treatment in her case is cure. She has been encouraged to call with any issues that might arise before her next visit here.   HeLaurie PandaNP   09/01/2015 3:19 PM

## 2015-09-08 ENCOUNTER — Ambulatory Visit (HOSPITAL_BASED_OUTPATIENT_CLINIC_OR_DEPARTMENT_OTHER): Payer: 59

## 2015-09-08 ENCOUNTER — Other Ambulatory Visit (HOSPITAL_BASED_OUTPATIENT_CLINIC_OR_DEPARTMENT_OTHER): Payer: 59

## 2015-09-08 ENCOUNTER — Ambulatory Visit: Payer: 59

## 2015-09-08 ENCOUNTER — Ambulatory Visit (HOSPITAL_BASED_OUTPATIENT_CLINIC_OR_DEPARTMENT_OTHER): Payer: 59 | Admitting: Nurse Practitioner

## 2015-09-08 ENCOUNTER — Encounter: Payer: Self-pay | Admitting: Nurse Practitioner

## 2015-09-08 ENCOUNTER — Other Ambulatory Visit: Payer: Self-pay | Admitting: *Deleted

## 2015-09-08 ENCOUNTER — Other Ambulatory Visit: Payer: Self-pay | Admitting: Oncology

## 2015-09-08 VITALS — BP 116/45 | HR 103 | Temp 98.1°F | Resp 17 | Ht 63.0 in | Wt 143.3 lb

## 2015-09-08 DIAGNOSIS — C773 Secondary and unspecified malignant neoplasm of axilla and upper limb lymph nodes: Secondary | ICD-10-CM

## 2015-09-08 DIAGNOSIS — Z5111 Encounter for antineoplastic chemotherapy: Secondary | ICD-10-CM

## 2015-09-08 DIAGNOSIS — C50412 Malignant neoplasm of upper-outer quadrant of left female breast: Secondary | ICD-10-CM

## 2015-09-08 DIAGNOSIS — R21 Rash and other nonspecific skin eruption: Secondary | ICD-10-CM

## 2015-09-08 DIAGNOSIS — Z170 Estrogen receptor positive status [ER+]: Secondary | ICD-10-CM

## 2015-09-08 DIAGNOSIS — R195 Other fecal abnormalities: Secondary | ICD-10-CM

## 2015-09-08 DIAGNOSIS — Z5189 Encounter for other specified aftercare: Secondary | ICD-10-CM | POA: Diagnosis not present

## 2015-09-08 LAB — COMPREHENSIVE METABOLIC PANEL
ALT: 38 U/L (ref 0–55)
AST: 34 U/L (ref 5–34)
Albumin: 3.4 g/dL — ABNORMAL LOW (ref 3.5–5.0)
Alkaline Phosphatase: 66 U/L (ref 40–150)
Anion Gap: 10 mEq/L (ref 3–11)
BUN: 16.9 mg/dL (ref 7.0–26.0)
CO2: 22 meq/L (ref 22–29)
CREATININE: 0.9 mg/dL (ref 0.6–1.1)
Calcium: 10.1 mg/dL (ref 8.4–10.4)
Chloride: 106 mEq/L (ref 98–109)
EGFR: 77 mL/min/{1.73_m2} — ABNORMAL LOW (ref >90)
GLUCOSE: 87 mg/dL (ref 70–140)
Potassium: 4.6 mEq/L (ref 3.5–5.1)
SODIUM: 138 meq/L (ref 136–145)
TOTAL PROTEIN: 7.2 g/dL (ref 6.4–8.3)
Total Bilirubin: <0.3 mg/dL (ref 0.20–1.20)

## 2015-09-08 LAB — CBC WITH DIFFERENTIAL/PLATELET
BASO%: 1.1 % (ref 0.0–2.0)
Basophils Absolute: 0.1 10*3/uL (ref 0.0–0.1)
EOS ABS: 0.1 10*3/uL (ref 0.0–0.5)
EOS%: 1.1 % (ref 0.0–7.0)
HCT: 31.8 % — ABNORMAL LOW (ref 34.8–46.6)
HGB: 10.5 g/dL — ABNORMAL LOW (ref 11.6–15.9)
LYMPH%: 12.7 % — AB (ref 14.0–49.7)
MCH: 30.1 pg (ref 25.1–34.0)
MCHC: 33 g/dL (ref 31.5–36.0)
MCV: 91.3 fL (ref 79.5–101.0)
MONO#: 0.9 10*3/uL (ref 0.1–0.9)
MONO%: 8.4 % (ref 0.0–14.0)
NEUT%: 76.7 % (ref 38.4–76.8)
NEUTROS ABS: 8.4 10*3/uL — AB (ref 1.5–6.5)
Platelets: 340 10*3/uL (ref 145–400)
RBC: 3.49 10*6/uL — AB (ref 3.70–5.45)
RDW: 13.7 % (ref 11.2–14.5)
WBC: 11 10*3/uL — AB (ref 3.9–10.3)
lymph#: 1.4 10*3/uL (ref 0.9–3.3)

## 2015-09-08 MED ORDER — SODIUM CHLORIDE 0.9 % IV SOLN
Freq: Once | INTRAVENOUS | Status: AC
  Administered 2015-09-08: 14:00:00 via INTRAVENOUS

## 2015-09-08 MED ORDER — SODIUM CHLORIDE 0.9 % IV SOLN
Freq: Once | INTRAVENOUS | Status: AC
  Administered 2015-09-08: 14:00:00 via INTRAVENOUS
  Filled 2015-09-08: qty 5

## 2015-09-08 MED ORDER — CYCLOPHOSPHAMIDE CHEMO INJECTION 1 GM
590.0000 mg/m2 | Freq: Once | INTRAMUSCULAR | Status: AC
  Administered 2015-09-08: 1000 mg via INTRAVENOUS
  Filled 2015-09-08: qty 50

## 2015-09-08 MED ORDER — DOXORUBICIN HCL CHEMO IV INJECTION 2 MG/ML
59.0000 mg/m2 | Freq: Once | INTRAVENOUS | Status: AC
  Administered 2015-09-08: 100 mg via INTRAVENOUS
  Filled 2015-09-08: qty 50

## 2015-09-08 MED ORDER — PEGFILGRASTIM 6 MG/0.6ML ~~LOC~~ PSKT
6.0000 mg | PREFILLED_SYRINGE | Freq: Once | SUBCUTANEOUS | Status: AC
  Administered 2015-09-08: 6 mg via SUBCUTANEOUS
  Filled 2015-09-08: qty 0.6

## 2015-09-08 MED ORDER — PALONOSETRON HCL INJECTION 0.25 MG/5ML
INTRAVENOUS | Status: AC
  Filled 2015-09-08: qty 5

## 2015-09-08 MED ORDER — HEPARIN SOD (PORK) LOCK FLUSH 100 UNIT/ML IV SOLN
500.0000 [IU] | Freq: Once | INTRAVENOUS | Status: AC | PRN
  Administered 2015-09-08: 500 [IU]
  Filled 2015-09-08: qty 5

## 2015-09-08 MED ORDER — SODIUM CHLORIDE 0.9 % IJ SOLN
10.0000 mL | INTRAMUSCULAR | Status: DC | PRN
  Administered 2015-09-08: 10 mL
  Filled 2015-09-08: qty 10

## 2015-09-08 MED ORDER — LORAZEPAM 0.5 MG PO TABS: 0.5000 mg | ORAL_TABLET | Freq: Every day | ORAL | Status: DC

## 2015-09-08 MED ORDER — PROCHLORPERAZINE MALEATE 10 MG PO TABS: 10.0000 mg | ORAL_TABLET | Freq: Four times a day (QID) | ORAL | Status: DC | PRN

## 2015-09-08 MED ORDER — PALONOSETRON HCL INJECTION 0.25 MG/5ML
0.2500 mg | Freq: Once | INTRAVENOUS | Status: AC
  Administered 2015-09-08: 0.25 mg via INTRAVENOUS

## 2015-09-08 NOTE — Progress Notes (Signed)
Adriamycin administered through a free dripping normal saline line. Positive blood return noted before, every 5 mL's during and after adriamycin administration. Patient tolerated well.  

## 2015-09-08 NOTE — Patient Instructions (Signed)
[facility] Discharge Instructions for Patients Receiving Chemotherapy  Today you received the following chemotherapy agents Adriamycin/Cytoxan.  To help prevent nausea and vomiting after your treatment, we encourage you to take your nausea medication as prescribed.    If you develop nausea and vomiting that is not controlled by your nausea medication, call the clinic.   BELOW ARE SYMPTOMS THAT SHOULD BE REPORTED IMMEDIATELY:  *FEVER GREATER THAN 100.5 F  *CHILLS WITH OR WITHOUT FEVER  NAUSEA AND VOMITING THAT IS NOT CONTROLLED WITH YOUR NAUSEA MEDICATION  *UNUSUAL SHORTNESS OF BREATH  *UNUSUAL BRUISING OR BLEEDING  TENDERNESS IN MOUTH AND THROAT WITH OR WITHOUT PRESENCE OF ULCERS  *URINARY PROBLEMS  *BOWEL PROBLEMS  UNUSUAL RASH Items with * indicate a potential emergency and should be followed up as soon as possible.  Feel free to call the clinic you have any questions or concerns. The clinic phone number is [phone].  Please show the CHEMO ALERT CARD at check-in to the Emergency Department and triage nurse.   

## 2015-09-08 NOTE — Progress Notes (Signed)
[place]  [phone] [phone]    ID: Alison [name] DOB: 1960/11/10  MR#: [id number]  VBT#:[id number]  Patient Care Team: [name], MD as PCP - General (Internal Medicine) Erroll Luna, MD as Consulting Physician (General Surgery) Chauncey Cruel, MD as Consulting Physician (Oncology) [name], MD as Consulting Physician (Radiation Oncology) Aloha Gell, MD as Consulting Physician (Obstetrics and Gynecology) Harriett Sine, MD as Consulting Physician (Dermatology) PCP: [name], MD OTHER MD:  CHIEF COMPLAINT: Estrogen receptor positive breast cancer  CURRENT TREATMENT: Neoadjuvant chemotherapy  BREAST CANCER HISTORY: [place] had routine screening mammography with tomography [facility] February 20 11/14/2014 showing a possible mass in the right breast. Diagnostic right mammography with right breast ultrasonography 11/01/2014 found the breast density to be category C. In the upper outer quadrant of the right breast there was a partially obscured mass which on physical exam was palpable as an area of thickening at the 10:00 position 8 cm from the nipple. Ultrasound showed multiple cysts in the upper outer right breast corresponding to the mass in question.  In November 2016 the patient felt a change in the upper left breast and brought it to her primary care physician's attention. Diagnostic left mammography with tomosynthesis and left breast ultrasonography at [facility] December 01/14/2015 found trabecular thickening throughout the left breast with skin thickening, but no circumscribed mass or suspicious calcifications. A rounded mass in the left axilla measured 6 mm. There was an additional mildly prominent lymph node in the left axilla which was what the patient had been palpating. Ultrasonography showed ill-defined swelling throughout the inner left breast with overlying skin thickening.. At the 2:00  position 4 cm from the nipple there was an irregular hypoechoic mass measuring 2.3 cm. A second mass at the 1:00 area 3 cm from the nipple measured 1 cm. The distance between these 2 masses was 1.8 cm. There was also a round hypoechoic left axillary mass measuring 0.6 cm. Overall the was an area of 3.7 cm of mixed echogenicity corresponding to the area of palpable soft tissue swelling.   On 08/04/2015 the patient underwent biopsy of the mass at the 2:00 axis 4 cm from the nipple and a second biopsy was performed of the hypoechoic mass at the 1:00 position 3 cm from the nipple. Biopsy of the suspicious nodule in the lower left axilla was attempted but could not be completed as the mass became obscured after administration of lidocaine.  The pathology from both left breast biopsies 08/04/2015 ([id number]) showed invasive ductal carcinoma, grade 3. The prognostic profile obtained from the larger tumor showed estrogen receptor to be strongly positive at 95%, progesterone receptor positive at 30%, with an MIB-1 of 12%, and no HER-2 amplification, the signals ratio being 1.22 and the number per cell 2.20.  On 08/09/2015 the patient underwent biopsy of this suspicious left axillary lymph node noted above, and this showed ([id number]) metastatic carcinoma with extracapsular extension.  The patient's subsequent history is as detailed below  INTERVAL HISTORY: [place] returns for follow up of her left breast cancer, alone. Today is day 1, cycle 2 of cyclophosphamide and doxorubicin x 4, with neulasta onpro given on day 2 for granulocyte support.   REVIEW OF SYSTEMS: Fortunately, Lanise's abdominal pain is controlled by the addition of omeprazole. She is still constantly nauseous, but denies vomiting. Her stools are pudding like and she passes a small quantity about 5 times daily. She is avoiding  imodium use. She mentioned for the first time that they are dark, almost completely black. They have been this way  since the start of chemo. She denies the appearance of bright red blood in the toilet. She has a mild rash to her upper chest and back, but this does not bother her. She denies fevers or chills. Her insomnia is up and down. A detailed review of systems is otherwise stable.   PAST MEDICAL HISTORY: Past Medical History  Diagnosis Date  . Hypertension   . Raynaud's disease   . Skin cancer   . Rectovaginal fistula     repaired 2014  . Osteopenia determined by x-ray     PAST SURGICAL HISTORY: Past Surgical History  Procedure Laterality Date  . C-section    . Abdominal hysterectomy      with BSO  . Appendectomy    . Colon resection    . Hernia repair      FAMILY HISTORY Family History  Problem Relation Age of Onset  . Lung cancer Father    the patient's father died from lung cancer at the age of 41 in the setting of tobacco abuse. The patient's mother died at the age of 43 with pancreatic cancer. The patient had no siblings. There is no history of breast or ovarian cancer in the family.  GYNECOLOGIC HISTORY:  No LMP recorded. Patient has had a hysterectomy. Menarche age 79, first live birth age 9. The patient is GX P1. She is status post total hysterectomy with bilateral salpingo-oophorectomy. She has been on hormone replacement since that time and is tapering to off at the time of this dictation.   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 She used to work at [employer]. Her husband [name] works for [employer] division at a [employer] their son [name] is currently at home.    ADVANCED DIRECTIVES: Not in place   HEALTH MAINTENANCE: Social History  Substance Use Topics  . Smoking status: Former Smoker -- 1.00 packs/day    Types: Cigarettes    Quit date: 11/25/2008  . Smokeless tobacco: Not on file  . Alcohol Use: No     Colonoscopy: April 2014/ Raynaldo Opitz at [facility]  PAP: s/p hysterectomy  Bone density: August 2016/ osteopenia  Lipid  panel:  Allergies  Allergen Reactions  . Sulfa Antibiotics Other (See Comments)    Blisters in mouth    Current Outpatient Prescriptions  Medication Sig Dispense Refill  . cetirizine (ZYRTEC) 10 MG tablet Take 10 mg by mouth daily.    . Cholecalciferol (VITAMIN D3) 2000 UNITS capsule Take 2,000 Units by mouth daily.     Marland Kitchen dexamethasone (DECADRON) 4 MG tablet Take 2 tablets by mouth once a day on the day after chemotherapy and then take 2 tablets two times a day for 2 days. Take with food. 30 tablet 1  . dextroamphetamine (DEXEDRINE SPANSULE) 15 MG 24 hr capsule Take 15 mg by mouth 2 (two) times daily.     . Fenofibric Acid 105 MG TABS Take 1 tablet by mouth daily.    Marland Kitchen gabapentin (NEURONTIN) 300 MG capsule Take 1 capsule (300 mg total) by mouth at bedtime. 90 capsule 4  . Icosapent Ethyl (VASCEPA) 1 G CAPS Take 2 capsules by mouth 2 (two) times daily.    Marland Kitchen lidocaine-prilocaine (EMLA) cream Apply to affected area once 30 g 3  . Multiple Vitamin (MULTIVITAMIN) tablet Take 1 tablet by mouth daily.    Marland Kitchen NIFEdipine (  PROCARDIA-XL/ADALAT-CC/NIFEDICAL-XL) 30 MG 24 hr tablet Take 30 mg by mouth daily.     Marland Kitchen olmesartan (BENICAR) 40 MG tablet Take 40 mg by mouth daily.    Marland Kitchen omeprazole (PRILOSEC) 40 MG capsule Take 1 capsule (40 mg total) by mouth daily. 30 capsule 2  . ondansetron (ZOFRAN) 8 MG tablet Take 1 tablet (8 mg total) by mouth 2 (two) times daily as needed. Start on the third day after chemotherapy. 30 tablet 1  . Probiotic Product (PROBIOTIC DAILY PO) Take 1 capsule by mouth daily.     . traZODone (DESYREL) 150 MG tablet Take 150 mg by mouth at bedtime.    . fluticasone (FLONASE) 50 MCG/ACT nasal spray Place 2 sprays into both nostrils daily. Reported on 09/08/2015    . LORazepam (ATIVAN) 0.5 MG tablet Take 1 tablet (0.5 mg total) by mouth at bedtime. 30 tablet 0  . polyethylene glycol (MIRALAX / GLYCOLAX) packet Take 17 g by mouth daily. Reported on 09/08/2015    . prochlorperazine  (COMPAZINE) 10 MG tablet Take 1 tablet (10 mg total) by mouth every 6 (six) hours as needed (Nausea or vomiting). 30 tablet 0   No current facility-administered medications for this visit.   Facility-Administered Medications Ordered in Other Visits  Medication Dose Route Frequency Provider Last Rate Last Dose  . cyclophosphamide (CYTOXAN) 1,000 mg in sodium chloride 0.9 % 250 mL chemo infusion  590 mg/m2 (Treatment Plan Actual) Intravenous Once Chauncey Cruel, MD      . DOXOrubicin (ADRIAMYCIN) chemo injection 100 mg  59 mg/m2 (Treatment Plan Actual) Intravenous Once Chauncey Cruel, MD      . fosaprepitant (EMEND) 150 mg, dexamethasone (DECADRON) 12 mg in sodium chloride 0.9 % 145 mL IVPB   Intravenous Once Chauncey Cruel, MD      . heparin lock flush 100 unit/mL  500 Units Intracatheter Once PRN Chauncey Cruel, MD      . pegfilgrastim (NEULASTA ONPRO KIT) injection 6 mg  6 mg Subcutaneous Once Chauncey Cruel, MD      . sodium chloride 0.9 % injection 10 mL  10 mL Intracatheter PRN Chauncey Cruel, MD        OBJECTIVE: Middle-aged white woman who appears stated age  8 Vitals:   09/08/15 1311  BP: 116/45  Pulse: 103  Temp: 98.1 F (36.7 C)  Resp: 17     Body mass index is 25.39 kg/(m^2).    ECOG FS:1 - Symptomatic but completely ambulatory  Skin: warm, dry, acniform lesions to chest and back HEENT: sclerae anicteric, conjunctivae pink, oropharynx clear. No thrush or mucositis.  Lymph Nodes: No cervical or supraclavicular lymphadenopathy  Lungs: clear to auscultation bilaterally, no rales, wheezes, or rhonci  Heart: regular rate and rhythm  Abdomen: round, soft, non tender, positive bowel sounds  Musculoskeletal: No focal spinal tenderness, no peripheral edema  Neuro: non focal, well oriented, positive affect  Breasts: deferred  LAB RESULTS:  CMP     Component Value Date/Time   NA 138 09/08/2015 1234   K 4.6 09/08/2015 1234   CO2 22 09/08/2015 1234    GLUCOSE 87 09/08/2015 1234   BUN 16.9 09/08/2015 1234   CREATININE 0.9 09/08/2015 1234   CALCIUM 10.1 09/08/2015 1234   PROT 7.2 09/08/2015 1234   ALBUMIN 3.4* 09/08/2015 1234   AST 34 09/08/2015 1234   ALT 38 09/08/2015 1234   ALKPHOS 66 09/08/2015 1234   BILITOT <0.30 09/08/2015 1234    INo results  found for: SPEP, UPEP  Lab Results  Component Value Date   WBC 11.0* 09/08/2015   NEUTROABS 8.4* 09/08/2015   HGB 10.5* 09/08/2015   HCT 31.8* 09/08/2015   MCV 91.3 09/08/2015   PLT 340 09/08/2015      Chemistry      Component Value Date/Time   NA 138 09/08/2015 1234   K 4.6 09/08/2015 1234   CO2 22 09/08/2015 1234   BUN 16.9 09/08/2015 1234   CREATININE 0.9 09/08/2015 1234      Component Value Date/Time   CALCIUM 10.1 09/08/2015 1234   ALKPHOS 66 09/08/2015 1234   AST 34 09/08/2015 1234   ALT 38 09/08/2015 1234   BILITOT <0.30 09/08/2015 1234       No results found for: LABCA2  No components found for: LABCA125  No results for input(s): INR in the last 168 hours.  Urinalysis No results found for: COLORURINE, APPEARANCEUR, LABSPEC, PHURINE, GLUCOSEU, HGBUR, BILIRUBINUR, KETONESUR, PROTEINUR, UROBILINOGEN, NITRITE, LEUKOCYTESUR  STUDIES: Ct Chest W Contrast  08/12/2015  CLINICAL DATA:  55 year old female with newly diagnosed left-sided breast cancer on 08/10/2015. EXAM: CT CHEST WITH CONTRAST TECHNIQUE: Multidetector CT imaging of the chest was performed during intravenous contrast administration. CONTRAST:  42m OMNIPAQUE IOHEXOL 300 MG/ML  SOLN COMPARISON:  Chest CT 11/12/2006. FINDINGS: Mediastinum/Lymph Nodes: Heart size is normal. There is no significant pericardial fluid, thickening or pericardial calcification. There is atherosclerosis of the thoracic aorta, the great vessels of the mediastinum and the coronary arteries, including calcified atherosclerotic plaque in the distal left main and proximal left anterior descending coronary arteries. No pathologically  enlarged mediastinal, internal mammary or hilar lymph nodes. Esophagus is unremarkable in appearance. No axillary lymphadenopathy. Lungs/Pleura: No suspicious appearing pulmonary nodules or masses. No acute consolidative airspace disease. No pleural effusions. Upper Abdomen: Bilateral adrenal glands are visualized and are normal in appearance. Other structures are unremarkable. Musculoskeletal/Soft Tissues: Asymmetric skin thickening in the left breast. No discrete left breast mass is confidently identified on today's CT examination. Chronic compression fracture of L1 with approximately 15% loss of anterior vertebral body height, unchanged compared to 2008. There are no aggressive appearing lytic or blastic lesions noted in the visualized portions of the skeleton. IMPRESSION: 1. Asymmetric skin thickening in the left breast. No discrete breast mass or left axillary adenopathy is identified on today's CT examination. 2. No findings to suggest metastatic disease in the thorax. 3. Atherosclerosis, including left main and left anterior descending coronary artery disease. Please note that although the presence of coronary artery calcium documents the presence of coronary artery disease, the severity of this disease and any potential stenosis cannot be assessed on this non-gated CT examination. Assessment for potential risk factor modification, dietary therapy or pharmacologic therapy may be warranted, if clinically indicated. Electronically Signed   By: DVinnie LangtonM.D.   On: 08/12/2015 09:32   Nm Bone Scan Whole Body  08/12/2015  CLINICAL DATA:  Breast cancer of upper outer quadrant of left female breast. EXAM: NUCLEAR MEDICINE WHOLE BODY BONE SCAN TECHNIQUE: Whole body anterior and posterior images were obtained approximately 3 hours after intravenous injection of radiopharmaceutical. RADIOPHARMACEUTICALS:  25.4 mCi Technetium-911mDP IV COMPARISON:  None. FINDINGS: Small focus of abnormal uptake is seen to  the right of the cervical spine most consistent with degenerative change. No other areas of abnormal uptake are noted. IMPRESSION: No definite evidence of osseous metastases. Electronically Signed   By: JaMarijo ConceptionM.D.   On: 08/12/2015 11:21  Mr Breast Bilateral W Wo Contrast  08/16/2015  CLINICAL DATA:  55 year old female who initially presented with thickening in the upper left breast and left axillary swelling. The patient went underwent ultrasound-guided biopsy on 08/04/2015 at 1 o'clock and at 2 o'clock in the left breast with pathology revealing grade 3 invasive ductal carcinoma with DCIS. A left axillary lymph node was also sampled on 08/09/2015 demonstrating metastatic carcinoma involvement. This MRI is for evaluation of disease extent. LABS:  Most recent lab values were obtained on 08/10/2015 with creatinine of 0.9 mg/dL and GFR of 68. EXAM: BILATERAL BREAST MRI WITH AND WITHOUT CONTRAST TECHNIQUE: Multiplanar, multisequence MR images of both breasts were obtained prior to and following the intravenous administration of 13 ml of MultiHance. THREE-DIMENSIONAL MR IMAGE RENDERING ON INDEPENDENT WORKSTATION: Three-dimensional MR images were rendered by post-processing of the original MR data on an independent workstation. The three-dimensional MR images were interpreted, and findings are reported in the following complete MRI report for this study. Three dimensional images were evaluated at the independent DynaCad workstation COMPARISON:  No prior MRI available for comparison. Correlation made with prior mammograms and ultrasounds. FINDINGS: Breast composition: c. Heterogeneous fibroglandular tissue. Background parenchymal enhancement: Moderate. Right breast: A right chest Port-A-Cath is in place in the upper medial right breast. Small hematoma is seen just anterior to the chest port. No mass or abnormal enhancement is identified in the right breast. Left breast: Two foci of susceptibility are seen  in the upper-outer left breast, corresponding to the biopsied sites of malignancy. There is highly suspicious diffuse heterogeneous non mass enhancement throughout the left breast occupying the majority of the upper outer and upper inner quadrants, and extending into the lower outer and lower inner quadrants as well. This enhancement measures approximately 7.7 x 6.1 cm in the axial plane, and 7.7 cm in the sagittal plane. There is diffuse skin thickening and edema throughout the left breast. There is no evidence of skin, nipple or pectoralis muscle enhancement. Lymph nodes: There are at least 2 abnormal appearing left axillary lymph nodes, 1 of which demonstrates internal susceptibility, consistent with the biopsied positive lymph node. No right axillary or bilateral internal mammary lymphadenopathy is identified. Ancillary findings:  None. IMPRESSION: 1. Diffuse abnormal enhancement involving all 4 quadrants of the left breast, highly suspicious for multicentric disease. 2. The known biopsied proven metastatic lymph node in the left axilla is identified. There is one other adjacent lymph node which also appears borderline abnormal. 3.  No MRI evidence of malignancy in the right breast. RECOMMENDATION: If documentation of multicentric disease will change management, an MRI guided biopsy of a distance site of enhancement in a separate quadrant may be performed. BI-RADS CATEGORY  6:  Known malignancy. Electronically Signed   By: Ammie Ferrier M.D.   On: 08/16/2015 13:06   Ir Fluoro Guide Cv Line Right  08/15/2015  CLINICAL DATA:  Breast carcinoma, needs venous access for chemotherapy. EXAM: TUNNELED PORT CATHETER PLACEMENT WITH ULTRASOUND AND FLUOROSCOPIC GUIDANCE FLUOROSCOPY TIME:  6 seconds ANESTHESIA/SEDATION: Intravenous Fentanyl and Versed were administered as conscious sedation during continuous cardiorespiratory monitoring by the radiology RN, with a total moderate sedation time of 20 minutes.  TECHNIQUE: The procedure, risks, benefits, and alternatives were explained to the patient. Questions regarding the procedure were encouraged and answered. The patient understands and consents to the procedure. As antibiotic prophylaxis, cefazolin was ordered pre-procedure and administered intravenously within one hour of incision. Patency of the right IJ vein was confirmed with ultrasound with  image documentation. An appropriate skin site was determined. Skin site was marked. Region was prepped using maximum barrier technique including cap and mask, sterile gown, sterile gloves, large sterile sheet, and Chlorhexidine as cutaneous antisepsis. The region was infiltrated locally with 1% lidocaine. Under real-time ultrasound guidance, the right IJ vein was accessed with a 21 gauge micropuncture needle; the needle tip within the vein was confirmed with ultrasound image documentation. Needle was exchanged over a 018 guidewire for transitional dilator which allowed passage of the [facility] wire into the IVC. Over this, the transitional dilator was exchanged for a 5 [place] MPA catheter. A small incision was made on the right anterior chest wall and a subcutaneous pocket fashioned. The power-injectable port was positioned and its catheter tunneled to the right IJ dermatotomy site. The MPA catheter was exchanged over an Amplatz wire for a peel-away sheath, through which the port catheter, which had been trimmed to the appropriate length, was advanced and positioned under fluoroscopy with its tip at the cavoatrial junction. Spot chest radiograph confirms good catheter position and no pneumothorax. The pocket was closed with deep interrupted and subcuticular continuous 3-0 Monocryl sutures. The port was flushed per protocol. The incisions were covered with Dermabond then covered with a sterile dressing. COMPLICATIONS: COMPLICATIONS None immediate IMPRESSION: Technically successful right IJ power-injectable port catheter placement.  Ready for routine use. Electronically Signed   By: Lucrezia Europe M.D.   On: 08/15/2015 10:02   Ir US Guide Vasc Access Right  08/15/2015  CLINICAL DATA:  Breast carcinoma, needs venous access for chemotherapy. EXAM: TUNNELED PORT CATHETER PLACEMENT WITH ULTRASOUND AND FLUOROSCOPIC GUIDANCE FLUOROSCOPY TIME:  6 seconds ANESTHESIA/SEDATION: Intravenous Fentanyl and Versed were administered as conscious sedation during continuous cardiorespiratory monitoring by the radiology RN, with a total moderate sedation time of 20 minutes. TECHNIQUE: The procedure, risks, benefits, and alternatives were explained to the patient. Questions regarding the procedure were encouraged and answered. The patient understands and consents to the procedure. As antibiotic prophylaxis, cefazolin was ordered pre-procedure and administered intravenously within one hour of incision. Patency of the right IJ vein was confirmed with ultrasound with image documentation. An appropriate skin site was determined. Skin site was marked. Region was prepped using maximum barrier technique including cap and mask, sterile gown, sterile gloves, large sterile sheet, and Chlorhexidine as cutaneous antisepsis. The region was infiltrated locally with 1% lidocaine. Under real-time ultrasound guidance, the right IJ vein was accessed with a 21 gauge micropuncture needle; the needle tip within the vein was confirmed with ultrasound image documentation. Needle was exchanged over a 018 guidewire for transitional dilator which allowed passage of the [facility] wire into the IVC. Over this, the transitional dilator was exchanged for a 5 [place] MPA catheter. A small incision was made on the right anterior chest wall and a subcutaneous pocket fashioned. The power-injectable port was positioned and its catheter tunneled to the right IJ dermatotomy site. The MPA catheter was exchanged over an Amplatz wire for a peel-away sheath, through which the port catheter, which had been  trimmed to the appropriate length, was advanced and positioned under fluoroscopy with its tip at the cavoatrial junction. Spot chest radiograph confirms good catheter position and no pneumothorax. The pocket was closed with deep interrupted and subcuticular continuous 3-0 Monocryl sutures. The port was flushed per protocol. The incisions were covered with Dermabond then covered with a sterile dressing. COMPLICATIONS: COMPLICATIONS None immediate IMPRESSION: Technically successful right IJ power-injectable port catheter placement. Ready for routine use. Electronically Signed  By: Lucrezia Europe M.D.   On: 08/15/2015 10:02   [place] Lt Breast Bx W Loc Dev 1st Lesion Img Bx Spec US Guide  08/11/2015  ADDENDUM REPORT: 08/10/2015 10:01 ADDENDUM: Pathology revealed metastatic carcinoma in the left axillary lymph node. This was found to be concordant by Dr. Everlean Alstrom. Pathology was called to Bary Castilla, RN, Nurse Navigator at [facility]. The patient is being seen today at [facility]. Dr. Erroll Luna will notify the patient of her results. Pathology results reported by Susa Raring RN, BSN on August 10, 2015. Electronically Signed   By: Everlean Alstrom M.D.   On: 08/10/2015 10:01  08/11/2015  CLINICAL DATA:  55 year old female with recently diagnosed left breast grade 3 invasive ductal carcinoma at 2 sites, 1 o'clock and 2 o'clock, with an indeterminate round lymph node in the left axilla for which biopsy was recommended. EXAM: ULTRASOUND GUIDED CORE NEEDLE BIOPSY OF A LEFT AXILLARY NODE COMPARISON:  Previous exam(s). FINDINGS: I met with the patient and we discussed the procedure of ultrasound-guided biopsy, including benefits and alternatives. We discussed the high likelihood of a successful procedure. We discussed the risks of the procedure, including infection, bleeding, tissue injury, clip migration, and inadequate sampling. Informed written consent was given.  The usual time-out protocol was performed immediately prior to the procedure. Using sterile technique and 2% Lidocaine as local anesthetic, under direct ultrasound visualization, a 12 gauge spring-loaded device was used to perform biopsy of the round lymph node in the left axilla using a inferior to superior approach. At the conclusion of the procedure a HydroMARK tissue marker clip was deployed into the biopsy cavity, and resides adjacent to the biopsied lymph node. Follow up 2 view mammogram was performed and dictated separately. IMPRESSION: Ultrasound guided biopsy of an indeterminate round left axillary lymph node, with a HydroMARK tissue marker clip placed adjacent to the biopsied lymph node. Electronically Signed: By: Everlean Alstrom M.D. On: 08/09/2015 15:58    ASSESSMENT: 55 y.o. [place] woman status post 2 separate masses in the left breast 08/04/2015, clinically mT2 N1, stage IIb/IIIa invasive ductal carcinoma, grade 3, the larger mass being estrogen and progesterone receptor positive, HER-2 negative, with an MIB-1 of 12%  (a) biopsy of a left axillary lymph node 08/09/2015 was positive, with extracapsular extension  (1) neoadjuvant chemotherapy will consist of doxorubicin and cyclophosphamide in dose dense fashion 4 followed by weekly paclitaxel 12  (2) definitive surgery to follow  (3) adjuvant radiation to follow surgery  (4) anti-estrogens to follow completion of local treatment  (a) bone density August 2016 showed osteopenia  (b) the patient is status post total abdominal hysterectomy with bilateral salpingo-oophorectomy.  (5) the patient appears to be a good candidate for the Alliance trial  PLAN: Elysha has improved since her visit last week. I am having her stools checked for occult blood, and she will bring that sample back to the lab tomorrow. It is possible her abdominal discomfort, dark stools, and mild drop in hgb could be due to an upper GI bleed, but these may all be  related to chemotherapy itself. If the guaiac returns negative, she will continue to monitor her stools for other changes. Her labs were sufficient for treatment to day. She will proceed with cycle 2 of doxorubicin and cyclophosphamide as planned. Today.   She will try naproxen 562m BID for her back and joint pain. I offered doxycycline for her generalized rash, and she declined.  Erendida will return in 1 week for labs and a nadir visit. She understands and agrees with this plan. She knows the goal of treatment in her case is cure. She has been encouraged to call with any issues that might arise before her next visit here.   Laurie Panda, NP   09/08/2015 2:23 PM

## 2015-09-09 ENCOUNTER — Ambulatory Visit: Payer: 59

## 2015-09-09 ENCOUNTER — Encounter: Payer: Self-pay | Admitting: *Deleted

## 2015-09-12 DIAGNOSIS — R195 Other fecal abnormalities: Secondary | ICD-10-CM

## 2015-09-12 LAB — FECAL OCCULT BLOOD, GUAIAC: OCCULT BLOOD: NEGATIVE

## 2015-09-15 ENCOUNTER — Ambulatory Visit (HOSPITAL_BASED_OUTPATIENT_CLINIC_OR_DEPARTMENT_OTHER): Payer: 59 | Admitting: Nurse Practitioner

## 2015-09-15 ENCOUNTER — Encounter: Payer: Self-pay | Admitting: *Deleted

## 2015-09-15 ENCOUNTER — Other Ambulatory Visit (HOSPITAL_BASED_OUTPATIENT_CLINIC_OR_DEPARTMENT_OTHER): Payer: 59

## 2015-09-15 ENCOUNTER — Encounter: Payer: Self-pay | Admitting: Nurse Practitioner

## 2015-09-15 VITALS — BP 115/53 | HR 103 | Temp 98.4°F | Resp 18 | Ht 63.0 in | Wt 144.3 lb

## 2015-09-15 DIAGNOSIS — Z170 Estrogen receptor positive status [ER+]: Secondary | ICD-10-CM

## 2015-09-15 DIAGNOSIS — C50412 Malignant neoplasm of upper-outer quadrant of left female breast: Secondary | ICD-10-CM

## 2015-09-15 DIAGNOSIS — C773 Secondary and unspecified malignant neoplasm of axilla and upper limb lymph nodes: Secondary | ICD-10-CM | POA: Diagnosis not present

## 2015-09-15 LAB — CBC WITH DIFFERENTIAL/PLATELET
BASO%: 2.9 % — ABNORMAL HIGH (ref 0.0–2.0)
Basophils Absolute: 0.1 10*3/uL (ref 0.0–0.1)
EOS ABS: 0.1 10*3/uL (ref 0.0–0.5)
EOS%: 3.4 % (ref 0.0–7.0)
HCT: 31.8 % — ABNORMAL LOW (ref 34.8–46.6)
HGB: 10.5 g/dL — ABNORMAL LOW (ref 11.6–15.9)
LYMPH%: 45.1 % (ref 14.0–49.7)
MCH: 30.3 pg (ref 25.1–34.0)
MCHC: 33 g/dL (ref 31.5–36.0)
MCV: 91.9 fL (ref 79.5–101.0)
MONO#: 0.3 10*3/uL (ref 0.1–0.9)
MONO%: 13.1 % (ref 0.0–14.0)
NEUT%: 35.5 % — ABNORMAL LOW (ref 38.4–76.8)
NEUTROS ABS: 0.7 10*3/uL — AB (ref 1.5–6.5)
Platelets: 310 10*3/uL (ref 145–400)
RBC: 3.46 10*6/uL — AB (ref 3.70–5.45)
RDW: 13.7 % (ref 11.2–14.5)
WBC: 2.1 10*3/uL — AB (ref 3.9–10.3)
lymph#: 0.9 10*3/uL (ref 0.9–3.3)

## 2015-09-15 LAB — COMPREHENSIVE METABOLIC PANEL
ALT: 35 U/L (ref 0–55)
AST: 24 U/L (ref 5–34)
Albumin: 3.7 g/dL (ref 3.5–5.0)
Alkaline Phosphatase: 86 U/L (ref 40–150)
Anion Gap: 6 mEq/L (ref 3–11)
BUN: 23 mg/dL (ref 7.0–26.0)
CHLORIDE: 105 meq/L (ref 98–109)
CO2: 27 meq/L (ref 22–29)
Calcium: 9.7 mg/dL (ref 8.4–10.4)
Creatinine: 0.8 mg/dL (ref 0.6–1.1)
EGFR: 84 mL/min/{1.73_m2} — AB (ref >90)
GLUCOSE: 106 mg/dL (ref 70–140)
POTASSIUM: 4.4 meq/L (ref 3.5–5.1)
SODIUM: 138 meq/L (ref 136–145)
Total Bilirubin: <0.3 mg/dL (ref 0.20–1.20)
Total Protein: 6.9 g/dL (ref 6.4–8.3)

## 2015-09-15 NOTE — Progress Notes (Signed)
Alison [name]  [phone] [phone]    ID: [name] DOB: 05-Dec-1960  MR#: [id number]  JYN#:[id number]  Patient Care Team: [name], MD as PCP - General (Internal Medicine) Erroll Luna, MD as Consulting Physician (General Surgery) Chauncey Cruel, MD as Consulting Physician (Oncology) [name], MD as Consulting Physician (Radiation Oncology) Aloha Gell, MD as Consulting Physician (Obstetrics and Gynecology) Harriett Sine, MD as Consulting Physician (Dermatology) PCP: [name], MD OTHER MD:  CHIEF COMPLAINT: Estrogen receptor positive breast cancer  CURRENT TREATMENT: Neoadjuvant chemotherapy  BREAST CANCER HISTORY: Alison [name] had routine screening mammography [facility] February 20 11/14/2014 showing a possible mass in the right breast. Diagnostic right mammography with right breast ultrasonography 11/01/2014 found the breast density to be category C. In the upper outer quadrant of the right breast there was a partially obscured mass which on physical exam was palpable as an area of thickening at the 10:00 position 8 cm from the nipple. Ultrasound showed multiple cysts in the upper outer right breast corresponding to the mass in question.  In November 2016 the patient felt a change in the upper left breast and brought it to her primary care physician's attention. Diagnostic left mammography with tomosynthesis and left breast ultrasonography at [facility] December 01/14/2015 found trabecular thickening throughout the left breast with skin thickening, but no circumscribed mass or suspicious calcifications. A rounded mass in the left axilla measured 6 mm. There was an additional mildly prominent lymph node in the left axilla which was what the patient had been palpating. Ultrasonography showed ill-defined swelling throughout the inner left breast with overlying skin thickening.. At the 2:00  position 4 cm from the nipple there was an irregular hypoechoic mass measuring 2.3 cm. A second mass at the 1:00 area 3 cm from the nipple measured 1 cm. The distance between these 2 masses was 1.8 cm. There was also a round hypoechoic left axillary mass measuring 0.6 cm. Overall the was an area of 3.7 cm of mixed echogenicity corresponding to the area of palpable soft tissue swelling.   On 08/04/2015 the patient underwent biopsy of the mass at the 2:00 axis 4 cm from the nipple and a second biopsy was performed of the hypoechoic mass at the 1:00 position 3 cm from the nipple. Biopsy of the suspicious nodule in the lower left axilla was attempted but could not be completed as the mass became obscured after administration of lidocaine.  The pathology from both left breast biopsies 08/04/2015 ([id number]) showed invasive ductal carcinoma, grade 3. The prognostic profile obtained from the larger tumor showed estrogen receptor to be strongly positive at 95%, progesterone receptor positive at 30%, with an MIB-1 of 12%, and no HER-2 amplification, the signals ratio being 1.22 and the number per cell 2.20.  On 08/09/2015 the patient underwent biopsy of this suspicious left axillary lymph node noted above, and this showed ([id number]) metastatic carcinoma with extracapsular extension.  The patient's subsequent history is as detailed below  INTERVAL HISTORY: Alison [name] returns for follow up of her left breast cancer, alone. Today is day 8, cycle 2 of cyclophosphamide and doxorubicin x 4, with neulasta onpro given on day 2 for granulocyte support.   REVIEW OF SYSTEMS: [name] has a good week with minimal symptoms. She denies fevers or chills. Her nausea has been minimal. Her bowels are back to their normal color though still somewhat loose. She denies mouth sores, rashes, or neuropathy  symptoms. Her hands are sensitive from her Raynaud's condition. The skin is very dry. Her appetite is good. She is sleeping off  and on. A detailed review of systems is otherwise stable.  PAST MEDICAL HISTORY: Past Medical History  Diagnosis Date  . Hypertension   . Raynaud's disease   . Skin cancer   . Rectovaginal fistula     repaired 2014  . Osteopenia determined by x-ray     PAST SURGICAL HISTORY: Past Surgical History  Procedure Laterality Date  . C-section    . Abdominal hysterectomy      with BSO  . Appendectomy    . Colon resection    . Hernia repair      FAMILY HISTORY Family History  Problem Relation Age of Onset  . Lung cancer Father    the patient's father died from lung cancer at the age of 7 in the setting of tobacco abuse. The patient's mother died at the age of 73 with pancreatic cancer. The patient had no siblings. There is no history of breast or ovarian cancer in the family.  GYNECOLOGIC HISTORY:  No LMP recorded. Patient has had a hysterectomy. Menarche age 59, first live birth age 46. The patient is GX P1. She is status post total hysterectomy with bilateral salpingo-oophorectomy. She has been on hormone replacement since that time and is tapering to off at the time of this dictation.   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 She used to work at [employer]. Her husband [name] works for [employer] division at a [employer] their son [name] is currently at home.    ADVANCED DIRECTIVES: Not in place   HEALTH MAINTENANCE: Social History  Substance Use Topics  . Smoking status: Former Smoker -- 1.00 packs/day    Types: Cigarettes    Quit date: 11/25/2008  . Smokeless tobacco: Not on file  . Alcohol Use: No     Colonoscopy: April 2014/ Raynaldo Opitz at [facility]  PAP: s/p hysterectomy  Bone density: August 2016/ osteopenia  Lipid panel:  Allergies  Allergen Reactions  . Sulfa Antibiotics Other (See Comments)    Blisters in mouth    Current Outpatient Prescriptions  Medication Sig Dispense Refill  . cetirizine (ZYRTEC) 10 MG tablet Take 10 mg  by mouth daily.    . Cholecalciferol (VITAMIN D3) 2000 UNITS capsule Take 2,000 Units by mouth daily.     Marland Kitchen dexamethasone (DECADRON) 4 MG tablet Take 2 tablets by mouth once a day on the day after chemotherapy and then take 2 tablets two times a day for 2 days. Take with food. 30 tablet 1  . dextroamphetamine (DEXEDRINE SPANSULE) 15 MG 24 hr capsule Take 15 mg by mouth 2 (two) times daily.     . Fenofibric Acid 105 MG TABS Take 1 tablet by mouth daily.    . fluticasone (FLONASE) 50 MCG/ACT nasal spray Place 2 sprays into both nostrils daily. Reported on 09/08/2015    . gabapentin (NEURONTIN) 300 MG capsule Take 1 capsule (300 mg total) by mouth at bedtime. 90 capsule 4  . Icosapent Ethyl (VASCEPA) 1 G CAPS Take 2 capsules by mouth 2 (two) times daily.    Marland Kitchen lidocaine-prilocaine (EMLA) cream Apply to affected area once 30 g 3  . LORazepam (ATIVAN) 0.5 MG tablet Take 1 tablet (0.5 mg total) by mouth at bedtime. 30 tablet 0  . Multiple Vitamin (MULTIVITAMIN) tablet Take 1 tablet by mouth daily.    Marland Kitchen NIFEdipine (PROCARDIA-XL/ADALAT-CC/NIFEDICAL-XL)  30 MG 24 hr tablet Take 30 mg by mouth daily.     Marland Kitchen olmesartan (BENICAR) 40 MG tablet Take 40 mg by mouth daily.    Marland Kitchen omeprazole (PRILOSEC) 40 MG capsule Take 1 capsule (40 mg total) by mouth daily. 30 capsule 2  . ondansetron (ZOFRAN) 8 MG tablet Take 1 tablet (8 mg total) by mouth 2 (two) times daily as needed. Start on the third day after chemotherapy. 30 tablet 1  . polyethylene glycol (MIRALAX / GLYCOLAX) packet Take 17 g by mouth daily. Reported on 09/08/2015    . Probiotic Product (PROBIOTIC DAILY PO) Take 1 capsule by mouth daily.     . prochlorperazine (COMPAZINE) 10 MG tablet Take 1 tablet (10 mg total) by mouth every 6 (six) hours as needed (Nausea or vomiting). 30 tablet 0  . traZODone (DESYREL) 150 MG tablet Take 150 mg by mouth at bedtime.     No current facility-administered medications for this visit.    OBJECTIVE: Middle-aged white woman  who appears stated age  75 Vitals:   09/15/15 1415  BP: 115/53  Pulse: 103  Temp: 98.4 F (36.9 C)  Resp: 18     Body mass index is 25.57 kg/(m^2).    ECOG FS:1 - Symptomatic but completely ambulatory  Sclerae unicteric, pupils round and equal Oropharynx clear and moist-- no thrush or other lesions No cervical or supraclavicular adenopathy Lungs no rales or rhonchi Heart regular rate and rhythm Abd soft, nontender, positive bowel sounds MSK no focal spinal tenderness, no upper extremity lymphedema Neuro: nonfocal, well oriented, appropriate affect Breasts: deferred  LAB RESULTS:  CMP     Component Value Date/Time   NA 138 09/15/2015 1323   K 4.4 09/15/2015 1323   CO2 27 09/15/2015 1323   GLUCOSE 106 09/15/2015 1323   BUN 23.0 09/15/2015 1323   CREATININE 0.8 09/15/2015 1323   CALCIUM 9.7 09/15/2015 1323   PROT 6.9 09/15/2015 1323   ALBUMIN 3.7 09/15/2015 1323   AST 24 09/15/2015 1323   ALT 35 09/15/2015 1323   ALKPHOS 86 09/15/2015 1323   BILITOT <0.30 09/15/2015 1323    INo results found for: SPEP, UPEP  Lab Results  Component Value Date   WBC 2.1* 09/15/2015   NEUTROABS 0.7* 09/15/2015   HGB 10.5* 09/15/2015   HCT 31.8* 09/15/2015   MCV 91.9 09/15/2015   PLT 310 09/15/2015      Chemistry      Component Value Date/Time   NA 138 09/15/2015 1323   K 4.4 09/15/2015 1323   CO2 27 09/15/2015 1323   BUN 23.0 09/15/2015 1323   CREATININE 0.8 09/15/2015 1323      Component Value Date/Time   CALCIUM 9.7 09/15/2015 1323   ALKPHOS 86 09/15/2015 1323   AST 24 09/15/2015 1323   ALT 35 09/15/2015 1323   BILITOT <0.30 09/15/2015 1323       No results found for: LABCA2  No components found for: LABCA125  No results for input(s): INR in the last 168 hours.  Urinalysis No results found for: COLORURINE, APPEARANCEUR, LABSPEC, PHURINE, GLUCOSEU, HGBUR, BILIRUBINUR, KETONESUR, PROTEINUR, UROBILINOGEN, NITRITE, LEUKOCYTESUR  STUDIES: No results  found.  ASSESSMENT: 55 y.o. [place] woman status post 2 separate masses in the left breast 08/04/2015, clinically mT2 N1, stage IIb/IIIa invasive ductal carcinoma, grade 3, the larger mass being estrogen and progesterone receptor positive, HER-2 negative, with an MIB-1 of 12%  (a) biopsy of a left axillary lymph node 08/09/2015 was positive, with extracapsular  extension  (1) neoadjuvant chemotherapy will consist of doxorubicin and cyclophosphamide in dose dense fashion 4 followed by weekly paclitaxel 12  (2) definitive surgery to follow  (3) adjuvant radiation to follow surgery  (4) anti-estrogens to follow completion of local treatment  (a) bone density August 2016 showed osteopenia  (b) the patient is status post total abdominal hysterectomy with bilateral salpingo-oophorectomy.  (5) the patient appears to be a good candidate for the Alliance trial  PLAN: Aviyah has had a successful second cycle of chemotherapy. The labs were reviewed in detail and were stable. Her hgb is still 10.5, her occult stool sample came back negative, and her stools have returned to their normal brown color so a GI bleed has been ruled out. Fortunately the abdominal pain has resolved with omeprazole.  Marlayna will return in 1 week for cycle 3 of treatment. She understands and agrees with this plan. She knows the goal of treatment in her case is cure. She has been encouraged to call with any issues that might arise before her next visit here.   Laurie Panda, NP   09/15/2015 2:31 PM

## 2015-09-22 ENCOUNTER — Ambulatory Visit (HOSPITAL_BASED_OUTPATIENT_CLINIC_OR_DEPARTMENT_OTHER): Payer: 59

## 2015-09-22 ENCOUNTER — Encounter: Payer: Self-pay | Admitting: Nurse Practitioner

## 2015-09-22 ENCOUNTER — Other Ambulatory Visit: Payer: Self-pay | Admitting: *Deleted

## 2015-09-22 ENCOUNTER — Ambulatory Visit: Payer: 59

## 2015-09-22 ENCOUNTER — Other Ambulatory Visit (HOSPITAL_BASED_OUTPATIENT_CLINIC_OR_DEPARTMENT_OTHER): Payer: 59

## 2015-09-22 ENCOUNTER — Ambulatory Visit (HOSPITAL_BASED_OUTPATIENT_CLINIC_OR_DEPARTMENT_OTHER): Payer: 59 | Admitting: Nurse Practitioner

## 2015-09-22 ENCOUNTER — Telehealth: Payer: Self-pay | Admitting: Oncology

## 2015-09-22 ENCOUNTER — Encounter: Payer: Self-pay | Admitting: *Deleted

## 2015-09-22 VITALS — BP 110/71 | HR 123 | Temp 98.9°F | Resp 20 | Ht 63.0 in | Wt 144.5 lb

## 2015-09-22 DIAGNOSIS — Z5189 Encounter for other specified aftercare: Secondary | ICD-10-CM

## 2015-09-22 DIAGNOSIS — C50412 Malignant neoplasm of upper-outer quadrant of left female breast: Secondary | ICD-10-CM

## 2015-09-22 DIAGNOSIS — Z170 Estrogen receptor positive status [ER+]: Secondary | ICD-10-CM

## 2015-09-22 DIAGNOSIS — C773 Secondary and unspecified malignant neoplasm of axilla and upper limb lymph nodes: Secondary | ICD-10-CM

## 2015-09-22 DIAGNOSIS — Z5111 Encounter for antineoplastic chemotherapy: Secondary | ICD-10-CM

## 2015-09-22 DIAGNOSIS — D6481 Anemia due to antineoplastic chemotherapy: Secondary | ICD-10-CM | POA: Diagnosis not present

## 2015-09-22 DIAGNOSIS — T451X5A Adverse effect of antineoplastic and immunosuppressive drugs, initial encounter: Secondary | ICD-10-CM

## 2015-09-22 LAB — CBC WITH DIFFERENTIAL/PLATELET
BASO%: 1.1 % (ref 0.0–2.0)
BASOS ABS: 0.2 10*3/uL — AB (ref 0.0–0.1)
EOS ABS: 0 10*3/uL (ref 0.0–0.5)
EOS%: 0.1 % (ref 0.0–7.0)
HEMATOCRIT: 32.3 % — AB (ref 34.8–46.6)
HEMOGLOBIN: 10.8 g/dL — AB (ref 11.6–15.9)
LYMPH%: 10.5 % — AB (ref 14.0–49.7)
MCH: 30.6 pg (ref 25.1–34.0)
MCHC: 33.4 g/dL (ref 31.5–36.0)
MCV: 91.5 fL (ref 79.5–101.0)
MONO#: 1.5 10*3/uL — AB (ref 0.1–0.9)
MONO%: 10.9 % (ref 0.0–14.0)
NEUT%: 77.4 % — ABNORMAL HIGH (ref 38.4–76.8)
NEUTROS ABS: 10.5 10*3/uL — AB (ref 1.5–6.5)
Platelets: 253 10*3/uL (ref 145–400)
RBC: 3.53 10*6/uL — ABNORMAL LOW (ref 3.70–5.45)
RDW: 14.6 % — ABNORMAL HIGH (ref 11.2–14.5)
WBC: 13.6 10*3/uL — AB (ref 3.9–10.3)
lymph#: 1.4 10*3/uL (ref 0.9–3.3)

## 2015-09-22 LAB — COMPREHENSIVE METABOLIC PANEL
ALT: 30 U/L (ref 0–55)
AST: 22 U/L (ref 5–34)
Albumin: 3.7 g/dL (ref 3.5–5.0)
Alkaline Phosphatase: 95 U/L (ref 40–150)
Anion Gap: 10 mEq/L (ref 3–11)
BUN: 13.1 mg/dL (ref 7.0–26.0)
CALCIUM: 9.8 mg/dL (ref 8.4–10.4)
CHLORIDE: 106 meq/L (ref 98–109)
CO2: 20 mEq/L — ABNORMAL LOW (ref 22–29)
CREATININE: 0.9 mg/dL (ref 0.6–1.1)
EGFR: 76 mL/min/{1.73_m2} — ABNORMAL LOW (ref >90)
GLUCOSE: 157 mg/dL — AB (ref 70–140)
Potassium: 4.3 mEq/L (ref 3.5–5.1)
SODIUM: 136 meq/L (ref 136–145)
Total Bilirubin: 0.31 mg/dL (ref 0.20–1.20)
Total Protein: 7.2 g/dL (ref 6.4–8.3)

## 2015-09-22 MED ORDER — HEPARIN SOD (PORK) LOCK FLUSH 100 UNIT/ML IV SOLN
500.0000 [IU] | Freq: Once | INTRAVENOUS | Status: AC | PRN
  Administered 2015-09-22: 500 [IU]
  Filled 2015-09-22: qty 5

## 2015-09-22 MED ORDER — PALONOSETRON HCL INJECTION 0.25 MG/5ML
0.2500 mg | Freq: Once | INTRAVENOUS | Status: AC
  Administered 2015-09-22: 0.25 mg via INTRAVENOUS

## 2015-09-22 MED ORDER — ALPRAZOLAM 0.5 MG PO TABS
ORAL_TABLET | ORAL | Status: AC
  Filled 2015-09-22: qty 1

## 2015-09-22 MED ORDER — SODIUM CHLORIDE 0.9 % IJ SOLN
10.0000 mL | INTRAMUSCULAR | Status: DC | PRN
  Administered 2015-09-22: 10 mL
  Filled 2015-09-22: qty 10

## 2015-09-22 MED ORDER — DOXORUBICIN HCL CHEMO IV INJECTION 2 MG/ML: 60.0000 mg/m2 | Freq: Once | INTRAVENOUS | Status: DC

## 2015-09-22 MED ORDER — PROCHLORPERAZINE MALEATE 10 MG PO TABS: 10.0000 mg | ORAL_TABLET | Freq: Four times a day (QID) | ORAL | Status: DC | PRN

## 2015-09-22 MED ORDER — PEGFILGRASTIM 6 MG/0.6ML ~~LOC~~ PSKT
6.0000 mg | PREFILLED_SYRINGE | Freq: Once | SUBCUTANEOUS | Status: AC
  Administered 2015-09-22: 6 mg via SUBCUTANEOUS
  Filled 2015-09-22: qty 0.6

## 2015-09-22 MED ORDER — SODIUM CHLORIDE 0.9 % IV SOLN
590.0000 mg/m2 | Freq: Once | INTRAVENOUS | Status: AC
  Administered 2015-09-22: 1000 mg via INTRAVENOUS
  Filled 2015-09-22: qty 50

## 2015-09-22 MED ORDER — PALONOSETRON HCL INJECTION 0.25 MG/5ML
INTRAVENOUS | Status: AC
  Filled 2015-09-22: qty 5

## 2015-09-22 MED ORDER — SODIUM CHLORIDE 0.9 % IV SOLN
Freq: Once | INTRAVENOUS | Status: AC
  Administered 2015-09-22: 15:00:00 via INTRAVENOUS
  Filled 2015-09-22: qty 5

## 2015-09-22 MED ORDER — SODIUM CHLORIDE 0.9 % IV SOLN
Freq: Once | INTRAVENOUS | Status: AC
  Administered 2015-09-22: 15:00:00 via INTRAVENOUS

## 2015-09-22 MED ORDER — ALPRAZOLAM 0.5 MG PO TABS
0.2500 mg | ORAL_TABLET | Freq: Once | ORAL | Status: AC
  Administered 2015-09-22: 0.25 mg via ORAL

## 2015-09-22 MED ORDER — DOXORUBICIN HCL CHEMO IV INJECTION 2 MG/ML
58.0000 mg/m2 | Freq: Once | INTRAVENOUS | Status: AC
  Administered 2015-09-22: 100 mg via INTRAVENOUS
  Filled 2015-09-22: qty 50

## 2015-09-22 NOTE — Progress Notes (Signed)
Alison [name]  [phone] [phone]    ID: [id number] TERRERO DOB: [birth year]  MR#: [id number]  KPV#:[id number]  Patient Care Team: Alison Pao, MD as PCP - General (Internal Medicine) Alison Luna, MD as Consulting Physician (General Surgery) Alison Cruel, MD as Consulting Physician (Oncology) Alison Silversmith, MD as Consulting Physician (Radiation Oncology) Alison Gell, MD as Consulting Physician (Obstetrics and Gynecology) Alison Sine, MD as Consulting Physician (Dermatology) PCP: Alison Pao, MD OTHER MD:  CHIEF COMPLAINT: Estrogen receptor positive breast cancer  CURRENT TREATMENT: Neoadjuvant chemotherapy  BREAST CANCER HISTORY: Alison [name] had routine screening mammography [facility] February 20 11/14/2014 showing a possible mass in the right breast. Diagnostic right mammography with right breast ultrasonography 11/01/2014 found the breast density to be category C. In the upper outer quadrant of the right breast there was a partially obscured mass which on physical exam was palpable as an area of thickening at the 10:00 position 8 cm from the nipple. Ultrasound showed multiple cysts in the upper outer right breast corresponding to the mass in question.  In November 2016 the patient felt a change in the upper left breast and brought it to her primary care physician's attention. Diagnostic left mammography with tomosynthesis and left breast ultrasonography at [facility] December 01/14/2015 found trabecular thickening throughout the left breast with skin thickening, but no circumscribed mass or suspicious calcifications. A rounded mass in the left axilla measured 6 mm. There was an additional mildly prominent lymph node in the left axilla which was what the patient had been palpating. Ultrasonography showed ill-defined swelling throughout the inner left breast with overlying skin thickening.. At the 2:00  position 4 cm from the nipple there was an irregular hypoechoic mass measuring 2.3 cm. A second mass at the 1:00 area 3 cm from the nipple measured 1 cm. The distance between these 2 masses was 1.8 cm. There was also a round hypoechoic left axillary mass measuring 0.6 cm. Overall the was an area of 3.7 cm of mixed echogenicity corresponding to the area of palpable soft tissue swelling.   On 08/04/2015 the patient underwent biopsy of the mass at the 2:00 axis 4 cm from the nipple and a second biopsy was performed of the hypoechoic mass at the 1:00 position 3 cm from the nipple. Biopsy of the suspicious nodule in the lower left axilla was attempted but could not be completed as the mass became obscured after administration of lidocaine.  The pathology from both left breast biopsies 08/04/2015 ([id number]) showed invasive ductal carcinoma, grade 3. The prognostic profile obtained from the larger tumor showed estrogen receptor to be strongly positive at 95%, progesterone receptor positive at 30%, with an MIB-1 of 12%, and no HER-2 amplification, the signals ratio being 1.22 and the number per cell 2.20.  On 08/09/2015 the patient underwent biopsy of this suspicious left axillary lymph node noted above, and this showed ([id number]) metastatic carcinoma with extracapsular extension.  The patient's subsequent history is as detailed below  INTERVAL HISTORY: Alison [name] returns for follow up of her left breast cancer, accompanied by her husband, Alison [name]. Today is day 1, cycle 3 of cyclophosphamide and doxorubicin x 4, with neulasta onpro given on day 2 for granulocyte support.   REVIEW OF SYSTEMS: Alison [name] denies fevers or chills. She has had some mild nausea controlled with compazine. She is moving her bowels well. She some blood tinged drainage from her nose and began using a humidifier  at home. The roof of her mouth can be sore, but she denies any sores to the mucous membrane. She is eating well with no trouble  swallowing. She has no rashes or neuropathy symptoms, just sensitivity and color changes secondary to her Raynaud's condition. Her skin is dry. She is sleeping better. A detailed review of systems is otherwise stable.  PAST MEDICAL HISTORY: Past Medical History  Diagnosis Date  . Hypertension   . Raynaud's disease   . Skin cancer   . Rectovaginal fistula     repaired 2014  . Osteopenia determined by x-ray     PAST SURGICAL HISTORY: Past Surgical History  Procedure Laterality Date  . C-section    . Abdominal hysterectomy      with BSO  . Appendectomy    . Colon resection    . Hernia repair      FAMILY HISTORY Family History  Problem Relation Age of Onset  . Lung cancer Father    the patient's father died from lung cancer at the age of 32 in the setting of tobacco abuse. The patient's mother died at the age of 56 with pancreatic cancer. The patient had no siblings. There is no history of breast or ovarian cancer in the family.  GYNECOLOGIC HISTORY:  No LMP recorded. Patient has had a hysterectomy. Menarche age 4, first live birth age 64. The patient is GX P1. She is status post total hysterectomy with bilateral salpingo-oophorectomy. She has been on hormone replacement since that time and is tapering to off at the time of this dictation.   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 She used to work at [employer]. Her husband Alison [name] works for [employer] division at a [employer] their son Alison [name] is currently at home.    ADVANCED DIRECTIVES: Not in place   HEALTH MAINTENANCE: Social History  Substance Use Topics  . Smoking status: Former Smoker -- 1.00 packs/day    Types: Cigarettes    Quit date: 11/25/2008  . Smokeless tobacco: Not on file  . Alcohol Use: No     Colonoscopy: April 2014/ Alison [name] at [facility]  PAP: s/p hysterectomy  Bone density: August 2016/ osteopenia  Lipid panel:  Allergies  Allergen Reactions  . Sulfa Antibiotics  Other (See Comments)    Blisters in mouth    Current Outpatient Prescriptions  Medication Sig Dispense Refill  . cetirizine (ZYRTEC) 10 MG tablet Take 10 mg by mouth daily.    . Cholecalciferol (VITAMIN D3) 2000 UNITS capsule Take 2,000 Units by mouth daily.     Marland Kitchen dexamethasone (DECADRON) 4 MG tablet Take 2 tablets by mouth once a day on the day after chemotherapy and then take 2 tablets two times a day for 2 days. Take with food. 30 tablet 1  . dextroamphetamine (DEXEDRINE SPANSULE) 15 MG 24 hr capsule Take 15 mg by mouth 2 (two) times daily.     . Fenofibric Acid 105 MG TABS Take 1 tablet by mouth daily.    . fluticasone (FLONASE) 50 MCG/ACT nasal spray Place 2 sprays into both nostrils daily. Reported on 09/08/2015    . gabapentin (NEURONTIN) 300 MG capsule Take 1 capsule (300 mg total) by mouth at bedtime. 90 capsule 4  . Icosapent Ethyl (VASCEPA) 1 G CAPS Take 2 capsules by mouth 2 (two) times daily.    Marland Kitchen lidocaine-prilocaine (EMLA) cream Apply to affected area once 30 g 3  . LORazepam (ATIVAN) 0.5 MG tablet Take 1 tablet (  0.5 mg total) by mouth at bedtime. 30 tablet 0  . Multiple Vitamin (MULTIVITAMIN) tablet Take 1 tablet by mouth daily.    Marland Kitchen NIFEdipine (PROCARDIA-XL/ADALAT-CC/NIFEDICAL-XL) 30 MG 24 hr tablet Take 30 mg by mouth daily.     Marland Kitchen olmesartan (BENICAR) 40 MG tablet Take 40 mg by mouth daily.    Marland Kitchen omeprazole (PRILOSEC) 40 MG capsule Take 1 capsule (40 mg total) by mouth daily. 30 capsule 2  . ondansetron (ZOFRAN) 8 MG tablet Take 1 tablet (8 mg total) by mouth 2 (two) times daily as needed. Start on the third day after chemotherapy. 30 tablet 1  . Probiotic Product (PROBIOTIC DAILY PO) Take 1 capsule by mouth daily.     . traZODone (DESYREL) 150 MG tablet Take 150 mg by mouth at bedtime.    . polyethylene glycol (MIRALAX / GLYCOLAX) packet Take 17 g by mouth daily. Reported on 09/22/2015    . prochlorperazine (COMPAZINE) 10 MG tablet Take 1 tablet (10 mg total) by mouth every 6  (six) hours as needed (Nausea or vomiting). 30 tablet 0   No current facility-administered medications for this visit.    OBJECTIVE: Middle-aged white woman who appears stated age  89 Vitals:   09/22/15 1256  BP: 110/71  Pulse: 123  Temp: 98.9 F (37.2 C)  Resp: 20     Body mass index is 25.6 kg/(m^2).    ECOG FS:1 - Symptomatic but completely ambulatory  Skin: warm, dry  HEENT: sclerae anicteric, conjunctivae pink, oropharynx clear. No thrush or mucositis.  Lymph Nodes: No cervical or supraclavicular lymphadenopathy  Lungs: clear to auscultation bilaterally, no rales, wheezes, or rhonci  Heart: regular rate and rhythm  Abdomen: round, soft, non tender, positive bowel sounds  Musculoskeletal: No focal spinal tenderness, no peripheral edema  Neuro: non focal, well oriented, positive affect  Breasts: deferred  LAB RESULTS:  CMP     Component Value Date/Time   NA 136 09/22/2015 1244   K 4.3 09/22/2015 1244   CO2 20* 09/22/2015 1244   GLUCOSE 157* 09/22/2015 1244   BUN 13.1 09/22/2015 1244   CREATININE 0.9 09/22/2015 1244   CALCIUM 9.8 09/22/2015 1244   PROT 7.2 09/22/2015 1244   ALBUMIN 3.7 09/22/2015 1244   AST 22 09/22/2015 1244   ALT 30 09/22/2015 1244   ALKPHOS 95 09/22/2015 1244   BILITOT 0.31 09/22/2015 1244    INo results found for: SPEP, UPEP  Lab Results  Component Value Date   WBC 13.6* 09/22/2015   NEUTROABS 10.5* 09/22/2015   HGB 10.8* 09/22/2015   HCT 32.3* 09/22/2015   MCV 91.5 09/22/2015   PLT 253 09/22/2015      Chemistry      Component Value Date/Time   NA 136 09/22/2015 1244   K 4.3 09/22/2015 1244   CO2 20* 09/22/2015 1244   BUN 13.1 09/22/2015 1244   CREATININE 0.9 09/22/2015 1244      Component Value Date/Time   CALCIUM 9.8 09/22/2015 1244   ALKPHOS 95 09/22/2015 1244   AST 22 09/22/2015 1244   ALT 30 09/22/2015 1244   BILITOT 0.31 09/22/2015 1244       No results found for: LABCA2  No components found for:  [id number]  No results for input(s): INR in the last 168 hours.  Urinalysis No results found for: COLORURINE, APPEARANCEUR, LABSPEC, PHURINE, GLUCOSEU, HGBUR, BILIRUBINUR, KETONESUR, PROTEINUR, UROBILINOGEN, NITRITE, LEUKOCYTESUR  STUDIES: No results found.  ASSESSMENT: 55 y.o. [employer] woman status post 2 separate masses in  the left breast 08/04/2015, clinically mT2 N1, stage IIb/IIIa invasive ductal carcinoma, grade 3, the larger mass being estrogen and progesterone receptor positive, HER-2 negative, with an MIB-1 of 12%  (a) biopsy of a left axillary lymph node 08/09/2015 was positive, with extracapsular extension  (1) neoadjuvant chemotherapy will consist of doxorubicin and cyclophosphamide in dose dense fashion 4 followed by weekly paclitaxel 12  (2) definitive surgery to follow  (3) adjuvant radiation to follow surgery  (4) anti-estrogens to follow completion of local treatment  (a) bone density August 2016 showed osteopenia  (b) the patient is status post total abdominal hysterectomy with bilateral salpingo-oophorectomy.  (5) the patient appears to be a good candidate for the Alliance trial  PLAN: Lynnette get nervous before every treatment, and today is no exception. She has asked for a small dose of xanax before chemo starts today, so I put in 0.90m as a one time order. The labs were reviewed in detail and were stable. She is asymptomatically anemic. She will proceed with cycle 3 of cyclophosphamide and doxorubicin as planned today.  I have ordered a breast MRI to take place the week after her 4th cycle. I have also put in her scheduling preference to be moved to Fridays in the future.  LJaymiwill return in 1 week for labs and a nadir visit. She understands and agrees with this plan. She knows the goal of treatment in her case is cure. She has been encouraged to call with any issues that might arise before her next visit here.   HLaurie Panda NP   09/22/2015 2:27 PM

## 2015-09-22 NOTE — Telephone Encounter (Signed)
Appointments complete per 1/26 pof. Patient to get avs report/appointments in inf area.

## 2015-09-22 NOTE — Patient Instructions (Signed)
[facility] Discharge Instructions for Patients Receiving Chemotherapy  Today you received the following chemotherapy agents Adriamycin/Cytoxan.  To help prevent nausea and vomiting after your treatment, we encourage you to take your nausea medication as prescribed.    If you develop nausea and vomiting that is not controlled by your nausea medication, call the clinic.   BELOW ARE SYMPTOMS THAT SHOULD BE REPORTED IMMEDIATELY:  *FEVER GREATER THAN 100.5 F  *CHILLS WITH OR WITHOUT FEVER  NAUSEA AND VOMITING THAT IS NOT CONTROLLED WITH YOUR NAUSEA MEDICATION  *UNUSUAL SHORTNESS OF BREATH  *UNUSUAL BRUISING OR BLEEDING  TENDERNESS IN MOUTH AND THROAT WITH OR WITHOUT PRESENCE OF ULCERS  *URINARY PROBLEMS  *BOWEL PROBLEMS  UNUSUAL RASH Items with * indicate a potential emergency and should be followed up as soon as possible.  Feel free to call the clinic you have any questions or concerns. The clinic phone number is [phone].  Please show the CHEMO ALERT CARD at check-in to the Emergency Department and triage nurse.   

## 2015-09-23 ENCOUNTER — Ambulatory Visit: Payer: 59

## 2015-09-24 ENCOUNTER — Other Ambulatory Visit: Payer: Self-pay | Admitting: Oncology

## 2015-09-29 ENCOUNTER — Ambulatory Visit (HOSPITAL_BASED_OUTPATIENT_CLINIC_OR_DEPARTMENT_OTHER): Payer: 59 | Admitting: Nurse Practitioner

## 2015-09-29 ENCOUNTER — Other Ambulatory Visit (HOSPITAL_BASED_OUTPATIENT_CLINIC_OR_DEPARTMENT_OTHER): Payer: 59

## 2015-09-29 ENCOUNTER — Encounter: Payer: Self-pay | Admitting: Nurse Practitioner

## 2015-09-29 VITALS — BP 90/63 | HR 96 | Temp 98.6°F | Resp 18 | Ht 63.0 in | Wt 149.3 lb

## 2015-09-29 DIAGNOSIS — C50412 Malignant neoplasm of upper-outer quadrant of left female breast: Secondary | ICD-10-CM

## 2015-09-29 DIAGNOSIS — D6481 Anemia due to antineoplastic chemotherapy: Secondary | ICD-10-CM

## 2015-09-29 DIAGNOSIS — C773 Secondary and unspecified malignant neoplasm of axilla and upper limb lymph nodes: Secondary | ICD-10-CM

## 2015-09-29 DIAGNOSIS — T451X5A Adverse effect of antineoplastic and immunosuppressive drugs, initial encounter: Secondary | ICD-10-CM

## 2015-09-29 DIAGNOSIS — R530 Neoplastic (malignant) related fatigue: Secondary | ICD-10-CM

## 2015-09-29 DIAGNOSIS — L853 Xerosis cutis: Secondary | ICD-10-CM | POA: Diagnosis not present

## 2015-09-29 LAB — CBC WITH DIFFERENTIAL/PLATELET
BASO%: 1.2 % (ref 0.0–2.0)
Basophils Absolute: 0 10*3/uL (ref 0.0–0.1)
EOS%: 0.9 % (ref 0.0–7.0)
Eosinophils Absolute: 0 10*3/uL (ref 0.0–0.5)
HCT: 27.6 % — ABNORMAL LOW (ref 34.8–46.6)
HGB: 9 g/dL — ABNORMAL LOW (ref 11.6–15.9)
LYMPH%: 22.7 % (ref 14.0–49.7)
MCH: 30 pg (ref 25.1–34.0)
MCHC: 32.6 g/dL (ref 31.5–36.0)
MCV: 92 fL (ref 79.5–101.0)
MONO#: 0.4 10*3/uL (ref 0.1–0.9)
MONO%: 10.9 % (ref 0.0–14.0)
NEUT%: 64.3 % (ref 38.4–76.8)
NEUTROS ABS: 2.1 10*3/uL (ref 1.5–6.5)
PLATELETS: 186 10*3/uL (ref 145–400)
RBC: 3 10*6/uL — AB (ref 3.70–5.45)
RDW: 14.9 % — ABNORMAL HIGH (ref 11.2–14.5)
WBC: 3.2 10*3/uL — AB (ref 3.9–10.3)
lymph#: 0.7 10*3/uL — ABNORMAL LOW (ref 0.9–3.3)
nRBC: 0 % (ref 0–0)

## 2015-09-29 LAB — COMPREHENSIVE METABOLIC PANEL
ALT: 21 U/L (ref 0–55)
ANION GAP: 7 meq/L (ref 3–11)
AST: 15 U/L (ref 5–34)
Albumin: 3.3 g/dL — ABNORMAL LOW (ref 3.5–5.0)
Alkaline Phosphatase: 104 U/L (ref 40–150)
BUN: 14.6 mg/dL (ref 7.0–26.0)
CALCIUM: 8.8 mg/dL (ref 8.4–10.4)
CHLORIDE: 108 meq/L (ref 98–109)
CO2: 24 meq/L (ref 22–29)
CREATININE: 0.9 mg/dL (ref 0.6–1.1)
EGFR: 72 mL/min/{1.73_m2} — ABNORMAL LOW (ref >90)
Glucose: 122 mg/dl (ref 70–140)
POTASSIUM: 4.6 meq/L (ref 3.5–5.1)
Sodium: 139 mEq/L (ref 136–145)
Total Bilirubin: <0.3 mg/dL (ref 0.20–1.20)
Total Protein: 6.2 g/dL — ABNORMAL LOW (ref 6.4–8.3)

## 2015-09-29 MED ORDER — CLOBETASOL PROPIONATE 0.05 % EX CREA: 1.0000 "application " | TOPICAL_CREAM | Freq: Two times a day (BID) | CUTANEOUS | Status: DC

## 2015-09-29 NOTE — Progress Notes (Signed)
[facility]  [phone] [phone]   ID: Alison [name] DOB: 29-Oct-1960  MR#: [id number]  GBM#:[id number]  Patient Care Team: [name], MD as PCP - General (Internal Medicine) Erroll Luna, MD as Consulting Physician (General Surgery) Chauncey Cruel, MD as Consulting Physician (Oncology) [name], MD as Consulting Physician (Radiation Oncology) Aloha Gell, MD as Consulting Physician (Obstetrics and Gynecology) Harriett Sine, MD as Consulting Physician (Dermatology) PCP: [name], MD OTHER MD:  CHIEF COMPLAINT: Estrogen receptor positive breast cancer  CURRENT TREATMENT: Neoadjuvant chemotherapy  BREAST CANCER HISTORY: Alison [name] had routine screening mammography [facility] 20 11/14/2014 showing a possible mass in the right breast. Diagnostic right mammography with right breast ultrasonography 11/01/2014 found the breast density to be category C. In the upper outer quadrant of the right breast there was a partially obscured mass which on physical exam was palpable as an area of thickening at the 10:00 position 8 cm from the nipple. Ultrasound showed multiple cysts in the upper outer right breast corresponding to the mass in question.  In November 2016 the patient felt a change in the upper left breast and brought it to her primary care physician's attention. Diagnostic left mammography with tomosynthesis and left breast ultrasonography at [facility] December 01/14/2015 found trabecular thickening throughout the left breast with skin thickening, but no circumscribed mass or suspicious calcifications. A rounded mass in the left axilla measured 6 mm. There was an additional mildly prominent lymph node in the left axilla which was what the patient had been palpating. Ultrasonography showed ill-defined swelling throughout the inner left breast with overlying skin thickening.. At the 2:00  position 4 cm from the nipple there was an irregular hypoechoic mass measuring 2.3 cm. A second mass at the 1:00 area 3 cm from the nipple measured 1 cm. The distance between these 2 masses was 1.8 cm. There was also a round hypoechoic left axillary mass measuring 0.6 cm. Overall the was an area of 3.7 cm of mixed echogenicity corresponding to the area of palpable soft tissue swelling.   On 08/04/2015 the patient underwent biopsy of the mass at the 2:00 axis 4 cm from the nipple and a second biopsy was performed of the hypoechoic mass at the 1:00 position 3 cm from the nipple. Biopsy of the suspicious nodule in the lower left axilla was attempted but could not be completed as the mass became obscured after administration of lidocaine.  The pathology from both left breast biopsies 08/04/2015 ([id number]) showed invasive ductal carcinoma, grade 3. The prognostic profile obtained from the larger tumor showed estrogen receptor to be strongly positive at 95%, progesterone receptor positive at 30%, with an MIB-1 of 12%, and no HER-2 amplification, the signals ratio being 1.22 and the number per cell 2.20.  On 08/09/2015 the patient underwent biopsy of this suspicious left axillary lymph node noted above, and this showed ([id number]) metastatic carcinoma with extracapsular extension.  The patient's subsequent history is as detailed below  INTERVAL HISTORY: Alison [name] returns for follow up of her left breast cancer, along. Today is day 8, cycle 3 of cyclophosphamide and doxorubicin x 4, with neulasta onpro given on day 2 for granulocyte support.   REVIEW OF SYSTEMS: Alison [name] is more tired than usual, but still can't get to sleep at night. She uses 19m benadryl and trazodone nightly. She has been trying to save the lorazepam for "emergencies." Her hands are very dry and red,  but there is no peeling or blistering, just cracks along the digits. She has tried several creams and lotions but nothing seems to helps.  She denies mouth sores, rashes, and neuropathy symptoms. Her appetite is increased with the steroids. A detailed review of systems is otherwise stable.   PAST MEDICAL HISTORY: Past Medical History  Diagnosis Date  . Hypertension   . Raynaud's disease   . Skin cancer   . Rectovaginal fistula     repaired 2014  . Osteopenia determined by x-ray     PAST SURGICAL HISTORY: Past Surgical History  Procedure Laterality Date  . C-section    . Abdominal hysterectomy      with BSO  . Appendectomy    . Colon resection    . Hernia repair      FAMILY HISTORY Family History  Problem Relation Age of Onset  . Lung cancer Father    the patient's father died from lung cancer at the age of 84 in the setting of tobacco abuse. The patient's mother died at the age of 48 with pancreatic cancer. The patient had no siblings. There is no history of breast or ovarian cancer in the family.  GYNECOLOGIC HISTORY:  No LMP recorded. Patient has had a hysterectomy. Menarche age 58, first live birth age 24. The patient is GX P1. She is status post total hysterectomy with bilateral salpingo-oophorectomy. She has been on hormone replacement since that time and is tapering to off at the time of this dictation.   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 She used to work at [employer]. Her husband [name] works for [employer] division at a [employer] their son [name] is currently at home.    ADVANCED DIRECTIVES: Not in place   HEALTH MAINTENANCE: Social History  Substance Use Topics  . Smoking status: Former Smoker -- 1.00 packs/day    Types: Cigarettes    Quit date: 11/25/2008  . Smokeless tobacco: Not on file  . Alcohol Use: No     Colonoscopy: April 2014/ Raynaldo Opitz at [facility]  PAP: s/p hysterectomy  Bone density: August 2016/ osteopenia  Lipid panel:  Allergies  Allergen Reactions  . Sulfa Antibiotics Other (See Comments)    Blisters in mouth    Current Outpatient  Prescriptions  Medication Sig Dispense Refill  . cetirizine (ZYRTEC) 10 MG tablet Take 10 mg by mouth daily.    . Cholecalciferol (VITAMIN D3) 2000 UNITS capsule Take 2,000 Units by mouth daily.     Marland Kitchen dexamethasone (DECADRON) 4 MG tablet Take 2 tablets by mouth once a day on the day after chemotherapy and then take 2 tablets two times a day for 2 days. Take with food. 30 tablet 1  . dextroamphetamine (DEXEDRINE SPANSULE) 15 MG 24 hr capsule Take 15 mg by mouth 2 (two) times daily.     . Fenofibric Acid 105 MG TABS Take 1 tablet by mouth daily.    . fluticasone (FLONASE) 50 MCG/ACT nasal spray Place 2 sprays into both nostrils daily. Reported on 09/08/2015    . gabapentin (NEURONTIN) 300 MG capsule Take 1 capsule (300 mg total) by mouth at bedtime. 90 capsule 4  . Icosapent Ethyl (VASCEPA) 1 G CAPS Take 2 capsules by mouth 2 (two) times daily.    Marland Kitchen lidocaine-prilocaine (EMLA) cream Apply to affected area once 30 g 3  . Multiple Vitamin (MULTIVITAMIN) tablet Take 1 tablet by mouth daily.    Marland Kitchen NIFEdipine (PROCARDIA-XL/ADALAT-CC/NIFEDICAL-XL) 30 MG 24 hr  tablet Take 30 mg by mouth daily.     Marland Kitchen olmesartan (BENICAR) 40 MG tablet Take 40 mg by mouth daily.    Marland Kitchen omeprazole (PRILOSEC) 40 MG capsule Take 1 capsule (40 mg total) by mouth daily. 30 capsule 2  . ondansetron (ZOFRAN) 8 MG tablet Take 1 tablet (8 mg total) by mouth 2 (two) times daily as needed. Start on the third day after chemotherapy. 30 tablet 1  . Probiotic Product (PROBIOTIC DAILY PO) Take 1 capsule by mouth daily.     . traZODone (DESYREL) 150 MG tablet Take 150 mg by mouth at bedtime.    Marland Kitchen LORazepam (ATIVAN) 0.5 MG tablet Take 1 tablet (0.5 mg total) by mouth at bedtime. (Patient not taking: Reported on 09/29/2015) 30 tablet 0  . polyethylene glycol (MIRALAX / GLYCOLAX) packet Take 17 g by mouth daily. Reported on 09/29/2015    . prochlorperazine (COMPAZINE) 10 MG tablet Take 1 tablet (10 mg total) by mouth every 6 (six) hours as needed  (Nausea or vomiting). (Patient not taking: Reported on 09/29/2015) 30 tablet 0   No current facility-administered medications for this visit.    OBJECTIVE: Middle-aged white [name] who appears stated age  69 Vitals:   09/29/15 1354  BP: 90/63  Pulse: 96  Temp: 98.6 F (37 C)  Resp: 18     Body mass index is 26.45 kg/(m^2).    ECOG FS:1 - Symptomatic but completely ambulatory   Hands excessively dry, erythema along knuckles, but blanchable  Sclerae unicteric, pupils round and equal Oropharynx clear and moist-- no thrush or other lesions No cervical or supraclavicular adenopathy Lungs no rales or rhonchi Heart regular rate and rhythm Abd soft, nontender, positive bowel sounds MSK no focal spinal tenderness, no upper extremity lymphedema Neuro: nonfocal, well oriented, appropriate affect Breasts: deferred  LAB RESULTS:  CMP     Component Value Date/Time   NA 139 09/29/2015 1327   K 4.6 09/29/2015 1327   CO2 24 09/29/2015 1327   GLUCOSE 122 09/29/2015 1327   BUN 14.6 09/29/2015 1327   CREATININE 0.9 09/29/2015 1327   CALCIUM 8.8 09/29/2015 1327   PROT 6.2* 09/29/2015 1327   ALBUMIN 3.3* 09/29/2015 1327   AST 15 09/29/2015 1327   ALT 21 09/29/2015 1327   ALKPHOS 104 09/29/2015 1327   BILITOT <0.30 09/29/2015 1327    INo results found for: SPEP, UPEP  Lab Results  Component Value Date   WBC 3.2* 09/29/2015   NEUTROABS 2.1 09/29/2015   HGB 9.0* 09/29/2015   HCT 27.6* 09/29/2015   MCV 92.0 09/29/2015   PLT 186 09/29/2015      Chemistry      Component Value Date/Time   NA 139 09/29/2015 1327   K 4.6 09/29/2015 1327   CO2 24 09/29/2015 1327   BUN 14.6 09/29/2015 1327   CREATININE 0.9 09/29/2015 1327      Component Value Date/Time   CALCIUM 8.8 09/29/2015 1327   ALKPHOS 104 09/29/2015 1327   AST 15 09/29/2015 1327   ALT 21 09/29/2015 1327   BILITOT <0.30 09/29/2015 1327       No results found for: LABCA2  No components found for: LABCA125  No  results for input(s): INR in the last 168 hours.  Urinalysis No results found for: COLORURINE, APPEARANCEUR, LABSPEC, PHURINE, GLUCOSEU, HGBUR, BILIRUBINUR, KETONESUR, PROTEINUR, UROBILINOGEN, NITRITE, LEUKOCYTESUR  STUDIES: No results found.  ASSESSMENT: 55 y.o. Alison [name] status post 2 separate masses in the left breast 08/04/2015, clinically mT2  N1, stage IIb/IIIa invasive ductal carcinoma, grade 3, the larger mass being estrogen and progesterone receptor positive, HER-2 negative, with an MIB-1 of 12%  (a) biopsy of a left axillary lymph node 08/09/2015 was positive, with extracapsular extension  (1) neoadjuvant chemotherapy will consist of doxorubicin and cyclophosphamide in dose dense fashion 4 followed by weekly paclitaxel 12  (2) definitive surgery to follow  (3) adjuvant radiation to follow surgery  (4) anti-estrogens to follow completion of local treatment  (a) bone density August 2016 showed osteopenia  (b) the patient is status post total abdominal hysterectomy with bilateral salpingo-oophorectomy.  (5) the patient appears to be a good candidate for the Alliance trial  PLAN: Alison [name] is fatigued, but stable. The labs were reviewed in detail and there has been a corresponding drop in her hgb to 9.0. She denies shortness of breath, dizziness, or lightheadedness so we can hold off on administering blood.   For her excessively dry hands she has tried over a dozen products. We gave her samples of aquaphor ointment to try, and I have sent in a prescription for clobetasol cream to apply twice daily.  Alison [name] will return in 1 week for her 4th and final cycle of cyclophosphamide and doxorubicin. She understands and agrees with this plan. She knows the goal of treatment in her case is cure. She has been encouraged to call with any issues that might arise before her next visit here.   Laurie Panda, NP   09/29/2015 2:43 PM

## 2015-10-06 ENCOUNTER — Other Ambulatory Visit (HOSPITAL_BASED_OUTPATIENT_CLINIC_OR_DEPARTMENT_OTHER): Payer: 59

## 2015-10-06 ENCOUNTER — Ambulatory Visit (HOSPITAL_BASED_OUTPATIENT_CLINIC_OR_DEPARTMENT_OTHER): Payer: 59

## 2015-10-06 ENCOUNTER — Ambulatory Visit (HOSPITAL_BASED_OUTPATIENT_CLINIC_OR_DEPARTMENT_OTHER): Payer: 59 | Admitting: Nurse Practitioner

## 2015-10-06 ENCOUNTER — Encounter: Payer: Self-pay | Admitting: *Deleted

## 2015-10-06 ENCOUNTER — Encounter: Payer: Self-pay | Admitting: Nurse Practitioner

## 2015-10-06 VITALS — BP 83/57 | HR 96 | Temp 98.0°F | Resp 18 | Wt 147.2 lb

## 2015-10-06 DIAGNOSIS — D708 Other neutropenia: Secondary | ICD-10-CM | POA: Diagnosis not present

## 2015-10-06 DIAGNOSIS — Z5111 Encounter for antineoplastic chemotherapy: Secondary | ICD-10-CM | POA: Diagnosis not present

## 2015-10-06 DIAGNOSIS — K1232 Oral mucositis (ulcerative) due to other drugs: Secondary | ICD-10-CM

## 2015-10-06 DIAGNOSIS — C50412 Malignant neoplasm of upper-outer quadrant of left female breast: Secondary | ICD-10-CM

## 2015-10-06 DIAGNOSIS — E860 Dehydration: Secondary | ICD-10-CM

## 2015-10-06 DIAGNOSIS — Z5189 Encounter for other specified aftercare: Secondary | ICD-10-CM | POA: Diagnosis not present

## 2015-10-06 DIAGNOSIS — K1231 Oral mucositis (ulcerative) due to antineoplastic therapy: Secondary | ICD-10-CM

## 2015-10-06 DIAGNOSIS — R21 Rash and other nonspecific skin eruption: Secondary | ICD-10-CM | POA: Diagnosis not present

## 2015-10-06 LAB — COMPREHENSIVE METABOLIC PANEL
ALT: 17 U/L (ref 0–55)
AST: 16 U/L (ref 5–34)
Albumin: 3.4 g/dL — ABNORMAL LOW (ref 3.5–5.0)
Alkaline Phosphatase: 81 U/L (ref 40–150)
Anion Gap: 10 mEq/L (ref 3–11)
BILIRUBIN TOTAL: 0.3 mg/dL (ref 0.20–1.20)
BUN: 15.7 mg/dL (ref 7.0–26.0)
CO2: 22 meq/L (ref 22–29)
CREATININE: 0.8 mg/dL (ref 0.6–1.1)
Calcium: 9.3 mg/dL (ref 8.4–10.4)
Chloride: 109 mEq/L (ref 98–109)
EGFR: 83 mL/min/{1.73_m2} — AB (ref >90)
GLUCOSE: 116 mg/dL (ref 70–140)
Potassium: 4.5 mEq/L (ref 3.5–5.1)
SODIUM: 140 meq/L (ref 136–145)
TOTAL PROTEIN: 6.7 g/dL (ref 6.4–8.3)

## 2015-10-06 LAB — CBC WITH DIFFERENTIAL/PLATELET
BASO%: 1.1 % (ref 0.0–2.0)
Basophils Absolute: 0.1 10*3/uL (ref 0.0–0.1)
EOS%: 0.2 % (ref 0.0–7.0)
Eosinophils Absolute: 0 10*3/uL (ref 0.0–0.5)
HCT: 28 % — ABNORMAL LOW (ref 34.8–46.6)
HGB: 9.3 g/dL — ABNORMAL LOW (ref 11.6–15.9)
LYMPH%: 13.2 % — AB (ref 14.0–49.7)
MCH: 30.2 pg (ref 25.1–34.0)
MCHC: 33.1 g/dL (ref 31.5–36.0)
MCV: 91.4 fL (ref 79.5–101.0)
MONO#: 1.3 10*3/uL — ABNORMAL HIGH (ref 0.1–0.9)
MONO%: 11 % (ref 0.0–14.0)
NEUT%: 74.5 % (ref 38.4–76.8)
NEUTROS ABS: 8.9 10*3/uL — AB (ref 1.5–6.5)
Platelets: 297 10*3/uL (ref 145–400)
RBC: 3.07 10*6/uL — AB (ref 3.70–5.45)
RDW: 15.5 % — ABNORMAL HIGH (ref 11.2–14.5)
WBC: 11.9 10*3/uL — AB (ref 3.9–10.3)
lymph#: 1.6 10*3/uL (ref 0.9–3.3)

## 2015-10-06 MED ORDER — VALACYCLOVIR HCL 500 MG PO TABS: 500.0000 mg | ORAL_TABLET | Freq: Two times a day (BID) | ORAL | Status: DC

## 2015-10-06 MED ORDER — SODIUM CHLORIDE 0.9 % IV SOLN
590.0000 mg/m2 | Freq: Once | INTRAVENOUS | Status: AC
  Administered 2015-10-06: 1000 mg via INTRAVENOUS
  Filled 2015-10-06: qty 50

## 2015-10-06 MED ORDER — SODIUM CHLORIDE 0.9 % IJ SOLN
10.0000 mL | INTRAMUSCULAR | Status: DC | PRN
  Administered 2015-10-06: 10 mL
  Filled 2015-10-06: qty 10

## 2015-10-06 MED ORDER — DOXORUBICIN HCL CHEMO IV INJECTION 2 MG/ML
59.0000 mg/m2 | Freq: Once | INTRAVENOUS | Status: AC
  Administered 2015-10-06: 100 mg via INTRAVENOUS
  Filled 2015-10-06: qty 50

## 2015-10-06 MED ORDER — PALONOSETRON HCL INJECTION 0.25 MG/5ML
0.2500 mg | Freq: Once | INTRAVENOUS | Status: AC
  Administered 2015-10-06: 0.25 mg via INTRAVENOUS

## 2015-10-06 MED ORDER — PEGFILGRASTIM 6 MG/0.6ML ~~LOC~~ PSKT
6.0000 mg | PREFILLED_SYRINGE | Freq: Once | SUBCUTANEOUS | Status: AC
  Administered 2015-10-06: 6 mg via SUBCUTANEOUS
  Filled 2015-10-06: qty 0.6

## 2015-10-06 MED ORDER — HEPARIN SOD (PORK) LOCK FLUSH 100 UNIT/ML IV SOLN
500.0000 [IU] | Freq: Once | INTRAVENOUS | Status: AC | PRN
  Administered 2015-10-06: 500 [IU]
  Filled 2015-10-06: qty 5

## 2015-10-06 MED ORDER — PALONOSETRON HCL INJECTION 0.25 MG/5ML
INTRAVENOUS | Status: AC
  Filled 2015-10-06: qty 5

## 2015-10-06 MED ORDER — SODIUM CHLORIDE 0.9 % IV SOLN
Freq: Once | INTRAVENOUS | Status: AC
  Administered 2015-10-06: 13:00:00 via INTRAVENOUS
  Filled 2015-10-06: qty 5

## 2015-10-06 MED ORDER — DOXYCYCLINE HYCLATE 100 MG PO TABS: 100.0000 mg | ORAL_TABLET | Freq: Every day | ORAL | Status: DC

## 2015-10-06 MED ORDER — SODIUM CHLORIDE 0.9 % IV SOLN
Freq: Once | INTRAVENOUS | Status: AC
  Administered 2015-10-06: 12:00:00 via INTRAVENOUS

## 2015-10-06 NOTE — Progress Notes (Signed)
[facility]  [phone] [phone]   ID: Alison [name] DOB: 09/06/1960  MR#: [id number]  CSN#:[id number]  Patient Care Team: Richard W Tisovec, MD as PCP - General (Internal Medicine) Thomas Cornett, MD as Consulting Physician (General Surgery) Gustav C Magrinat, MD as Consulting Physician (Oncology) Stacy Wentworth, MD as Consulting Physician (Radiation Oncology) Kelly Fogleman, MD as Consulting Physician (Obstetrics and Gynecology) Walter Whitworth, MD as Consulting Physician (Dermatology) PCP: Richard W Tisovec, MD OTHER MD:  CHIEF COMPLAINT: Estrogen receptor positive breast cancer  CURRENT TREATMENT: Neoadjuvant chemotherapy  BREAST CANCER HISTORY: Alison [name] had routine screening mammography with tomography [facility] February 20 11/14/2014 showing a possible mass in the right breast. Diagnostic right mammography with right breast ultrasonography 11/01/2014 found the breast density to be category C. In the upper outer quadrant of the right breast there was a partially obscured mass which on physical exam was palpable as an area of thickening at the 10:00 position 8 cm from the nipple. Ultrasound showed multiple cysts in the upper outer right breast corresponding to the mass in question.  In November 2016 the patient felt a change in the upper left breast and brought it to her primary care physician's attention. Diagnostic left mammography with tomosynthesis and left breast ultrasonography at [facility] December 01/14/2015 found trabecular thickening throughout the left breast with skin thickening, but no circumscribed mass or suspicious calcifications. A rounded mass in the left axilla measured 6 mm. There was an additional mildly prominent lymph node in the left axilla which was what the patient had been palpating. Ultrasonography showed ill-defined swelling throughout the inner left breast with overlying skin thickening.. At the 2:00  position 4 cm from the nipple there was an irregular hypoechoic mass measuring 2.3 cm. A second mass at the 1:00 area 3 cm from the nipple measured 1 cm. The distance between these 2 masses was 1.8 cm. There was also a round hypoechoic left axillary mass measuring 0.6 cm. Overall the was an area of 3.7 cm of mixed echogenicity corresponding to the area of palpable soft tissue swelling.   On 08/04/2015 the patient underwent biopsy of the mass at the 2:00 axis 4 cm from the nipple and a second biopsy was performed of the hypoechoic mass at the 1:00 position 3 cm from the nipple. Biopsy of the suspicious nodule in the lower left axilla was attempted but could not be completed as the mass became obscured after administration of lidocaine.  The pathology from both left breast biopsies 08/04/2015 ([id number]) showed invasive ductal carcinoma, grade 3. The prognostic profile obtained from the larger tumor showed estrogen receptor to be strongly positive at 95%, progesterone receptor positive at 30%, with an MIB-1 of 12%, and no HER-2 amplification, the signals ratio being 1.22 and the number per cell 2.20.  On 08/09/2015 the patient underwent biopsy of this suspicious left axillary lymph node noted above, and this showed ([id number]) metastatic carcinoma with extracapsular extension.  The patient's subsequent history is as detailed below  INTERVAL HISTORY: Alison [name] returns for follow up of her left breast cancer, along. Today is day 1, cycle 4 of cyclophosphamide and doxorubicin x 4, with neulasta onpro given on day 2 for granulocyte support.   REVIEW OF SYSTEMS: Alison [name]'s hands have improved somewhat since she started using the clobetasol cream last week. She has been applying it to her chest as well, which has a localized pruritic rash. Her water intake has been poor because of   throat blisters. She swished with warm salt water and baking soda, but they took awhile to improve. Her appetite is down as well  as hr energy level. She denies fevers, chills, nausea, vomiting, or changes in bowel or bladder habits. She is using zzzquil nightly for insomnia. A detailed review of system is otherwise stable.   PAST MEDICAL HISTORY: Past Medical History  Diagnosis Date  . Hypertension   . Raynaud's disease   . Skin cancer   . Rectovaginal fistula     repaired 2014  . Osteopenia determined by x-ray     PAST SURGICAL HISTORY: Past Surgical History  Procedure Laterality Date  . C-section    . Abdominal hysterectomy      with BSO  . Appendectomy    . Colon resection    . Hernia repair      FAMILY HISTORY Family History  Problem Relation Age of Onset  . Lung cancer Father    the patient's father died from lung cancer at the age of 68 in the setting of tobacco abuse. The patient's mother died at the age of 69 with pancreatic cancer. The patient had no siblings. There is no history of breast or ovarian cancer in the family.  GYNECOLOGIC HISTORY:  No LMP recorded. Patient has had a hysterectomy. Menarche age 9, first live birth age 29. The patient is GX P1. She is status post total hysterectomy with bilateral salpingo-oophorectomy. She has been on hormone replacement since that time and is tapering to off at the time of this dictation.   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 She used to work at [employer]. Her husband Alison [name] works for the contract division at a local [employer] their son Alison [name] is currently at home.    ADVANCED DIRECTIVES: Not in place   HEALTH MAINTENANCE: Social History  Substance Use Topics  . Smoking status: Former Smoker -- 1.00 packs/day    Types: Cigarettes    Quit date: 11/25/2008  . Smokeless tobacco: Not on file  . Alcohol Use: No     Colonoscopy: April 2014/ Linda Farkas at [facility]  PAP: s/p hysterectomy  Bone density: August 2016/ osteopenia  Lipid panel:  Allergies  Allergen Reactions  . Sulfa Antibiotics Other (See Comments)     Blisters in mouth    Current Outpatient Prescriptions  Medication Sig Dispense Refill  . cetirizine (ZYRTEC) 10 MG tablet Take 10 mg by mouth daily.    . Cholecalciferol (VITAMIN D3) 2000 UNITS capsule Take 2,000 Units by mouth daily.     . clobetasol cream (TEMOVATE) 0.05 % Apply 1 application topically 2 (two) times daily. 30 g 0  . dexamethasone (DECADRON) 4 MG tablet Take 2 tablets by mouth once a day on the day after chemotherapy and then take 2 tablets two times a day for 2 days. Take with food. 30 tablet 1  . dextroamphetamine (DEXEDRINE SPANSULE) 15 MG 24 hr capsule Take 15 mg by mouth 2 (two) times daily.     . Fenofibric Acid 105 MG TABS Take 1 tablet by mouth daily.    . fluticasone (FLONASE) 50 MCG/ACT nasal spray Place 2 sprays into both nostrils daily. Reported on 09/08/2015    . gabapentin (NEURONTIN) 300 MG capsule Take 1 capsule (300 mg total) by mouth at bedtime. 90 capsule 4  . Icosapent Ethyl (VASCEPA) 1 G CAPS Take 2 capsules by mouth 2 (two) times daily.    . lidocaine-prilocaine (EMLA) cream Apply to affected area   once 30 g 3  . Multiple Vitamin (MULTIVITAMIN) tablet Take 1 tablet by mouth daily.    Marland Kitchen NIFEdipine (PROCARDIA-XL/ADALAT-CC/NIFEDICAL-XL) 30 MG 24 hr tablet Take 30 mg by mouth daily.     Marland Kitchen olmesartan (BENICAR) 40 MG tablet Take 40 mg by mouth daily.    Marland Kitchen omeprazole (PRILOSEC) 40 MG capsule Take 1 capsule (40 mg total) by mouth daily. 30 capsule 2  . ondansetron (ZOFRAN) 8 MG tablet Take 1 tablet (8 mg total) by mouth 2 (two) times daily as needed. Start on the third day after chemotherapy. 30 tablet 1  . polyethylene glycol (MIRALAX / GLYCOLAX) packet Take 17 g by mouth daily. Reported on 10/06/2015    . Probiotic Product (PROBIOTIC DAILY PO) Take 1 capsule by mouth daily.     . traZODone (DESYREL) 150 MG tablet Take 150 mg by mouth at bedtime.    Marland Kitchen UNABLE TO FIND Take 2 tablets by mouth at bedtime. Zzz Quil    . doxycycline (VIBRA-TABS) 100 MG tablet Take 1  tablet (100 mg total) by mouth daily. 30 tablet 1  . LORazepam (ATIVAN) 0.5 MG tablet Take 1 tablet (0.5 mg total) by mouth at bedtime. (Patient not taking: Reported on 09/29/2015) 30 tablet 0  . prochlorperazine (COMPAZINE) 10 MG tablet Take 1 tablet (10 mg total) by mouth every 6 (six) hours as needed (Nausea or vomiting). (Patient not taking: Reported on 09/29/2015) 30 tablet 0  . valACYclovir (VALTREX) 500 MG tablet Take 1 tablet (500 mg total) by mouth 2 (two) times daily. 30 tablet 1   No current facility-administered medications for this visit.    OBJECTIVE: Middle-aged white woman who appears stated age  49 Vitals:   10/06/15 1101  BP: 83/57  Pulse: 96  Temp: 98 F (36.7 C)  Resp: 18     Body mass index is 26.08 kg/(m^2).    ECOG FS:1 - Symptomatic but completely ambulatory  Skin: warm, dry, papular erythematous rash to chest, excessively dry hands but no cracking or peeling HEENT: sclerae anicteric, conjunctivae pink, oropharynx clear. No thrush or mucositis.  Lymph Nodes: No cervical or supraclavicular lymphadenopathy  Lungs: clear to auscultation bilaterally, no rales, wheezes, or rhonci  Heart: regular rate and rhythm  Abdomen: round, soft, non tender, positive bowel sounds  Musculoskeletal: No focal spinal tenderness, no peripheral edema  Neuro: non focal, well oriented, positive affect  Breasts: deferred  LAB RESULTS:  CMP     Component Value Date/Time   NA 140 10/06/2015 1029   K 4.5 10/06/2015 1029   CO2 22 10/06/2015 1029   GLUCOSE 116 10/06/2015 1029   BUN 15.7 10/06/2015 1029   CREATININE 0.8 10/06/2015 1029   CALCIUM 9.3 10/06/2015 1029   PROT 6.7 10/06/2015 1029   ALBUMIN 3.4* 10/06/2015 1029   AST 16 10/06/2015 1029   ALT 17 10/06/2015 1029   ALKPHOS 81 10/06/2015 1029   BILITOT 0.30 10/06/2015 1029    INo results found for: SPEP, UPEP  Lab Results  Component Value Date   WBC 11.9* 10/06/2015   NEUTROABS 8.9* 10/06/2015   HGB 9.3* 10/06/2015     HCT 28.0* 10/06/2015   MCV 91.4 10/06/2015   PLT 297 10/06/2015      Chemistry      Component Value Date/Time   NA 140 10/06/2015 1029   K 4.5 10/06/2015 1029   CO2 22 10/06/2015 1029   BUN 15.7 10/06/2015 1029   CREATININE 0.8 10/06/2015 1029  Component Value Date/Time   CALCIUM 9.3 10/06/2015 1029   ALKPHOS 81 10/06/2015 1029   AST 16 10/06/2015 1029   ALT 17 10/06/2015 1029   BILITOT 0.30 10/06/2015 1029       No results found for: LABCA2  No components found for: LABCA125  No results for input(s): INR in the last 168 hours.  Urinalysis No results found for: COLORURINE, APPEARANCEUR, LABSPEC, PHURINE, GLUCOSEU, HGBUR, BILIRUBINUR, KETONESUR, PROTEINUR, UROBILINOGEN, NITRITE, LEUKOCYTESUR  STUDIES: No results found.  ASSESSMENT: 55 y.o. Alison [name] woman status post 2 separate masses in the left breast 08/04/2015, clinically mT2 N1, stage IIb/IIIa invasive ductal carcinoma, grade 3, the larger mass being estrogen and progesterone receptor positive, HER-2 negative, with an MIB-1 of 12%  (a) biopsy of a left axillary lymph node 08/09/2015 was positive, with extracapsular extension  (1) neoadjuvant chemotherapy will consist of doxorubicin and cyclophosphamide in dose dense fashion 4 followed by weekly paclitaxel 12  (2) definitive surgery to follow  (3) adjuvant radiation to follow surgery  (4) anti-estrogens to follow completion of local treatment  (a) bone density August 2016 showed osteopenia  (b) the patient is status post total abdominal hysterectomy with bilateral salpingo-oophorectomy.  (5) the patient appears to be a good candidate for the Alliance trial  PLAN: Guillermina's blood pressure is unusually low, but she is asymptomatic. I am going to increase her fluids to 500cc/hr for 1L or at least the duration of her treatment today. She will increase her fluid intake at home. She will proceed with her 4th cycle of doxorubicin and cyclophosphamide as  planned.   For her chest rash we are going to try doxycycline 147m daily. For her mouth sores I have written valacyclovir 5056mdaily. She will use both at her discretion, but I advised she use them consistently at least during the week of treatment.   LeVirgenill return in 1 week for labs and a nadir visit. She understands and agrees with this plan. She knows the goal of treatment in her case is cure. She has been encouraged to call with any issues that might arise before her next visit here.   HeLaurie PandaNP   10/06/2015 11:29 AM

## 2015-10-06 NOTE — Patient Instructions (Addendum)
[facility] Discharge Instructions for Patients Receiving Chemotherapy  Today you received the following chemotherapy agents Adriamycin/Cytoxan To help prevent nausea and vomiting after your treatment, we encourage you to take your nausea medication as prescribed.  If you develop nausea and vomiting that is not controlled by your nausea medication, call the clinic.   BELOW ARE SYMPTOMS THAT SHOULD BE REPORTED IMMEDIATELY:  *FEVER GREATER THAN 100.5 F  *CHILLS WITH OR WITHOUT FEVER  NAUSEA AND VOMITING THAT IS NOT CONTROLLED WITH YOUR NAUSEA MEDICATION   *UNUSUAL SHORTNESS OF BREATH   Dehydration, Adult Dehydration is a condition in which you do not have enough fluid or water in your body. It happens when you take in less fluid than you lose. Vital organs such as the kidneys, brain, and heart cannot function without a proper amount of fluids. Any loss of fluids from the body can cause dehydration.  Dehydration can range from mild to severe. This condition should be treated right away to help prevent it from becoming severe. CAUSES  This condition may be caused by:  Vomiting.  Diarrhea.  Excessive sweating, such as when exercising in hot or humid weather.  Not drinking enough fluid during strenuous exercise or during an illness.  Excessive urine output.  Fever.  Certain medicines. RISK FACTORS This condition is more likely to develop in:  People who are taking certain medicines that cause the body to lose excess fluid (diuretics).   People who have a chronic illness, such as diabetes, that may increase urination.  Older adults.   People who live at high altitudes.   People who participate in endurance sports.  SYMPTOMS  Mild Dehydration  Thirst.  Dry lips.  Slightly dry mouth.  Dry, warm skin. Moderate Dehydration  Very dry mouth.   Muscle cramps.   Dark urine and decreased urine production.   Decreased tear production.    Headache.   Light-headedness, especially when you stand up from a sitting position.  Severe Dehydration  Changes in skin.   Cold and clammy skin.   Skin does not spring back quickly when lightly pinched and released.   Changes in body fluids.   Extreme thirst.   No tears.   Not able to sweat when body temperature is high, such as in hot weather.   Minimal urine production.   Changes in vital signs.   Rapid, weak pulse (more than 100 beats per minute when you are sitting still).   Rapid breathing.   Low blood pressure.   Other changes.   Sunken eyes.   Cold hands and feet.   Confusion.  Lethargy and difficulty being awakened.  Fainting (syncope).   Short-term weight loss.   Unconsciousness. DIAGNOSIS  This condition may be diagnosed based on your symptoms. You may also have tests to determine how severe your dehydration is. These tests may include:   Urine tests.   Blood tests.  TREATMENT  Treatment for this condition depends on the severity. Mild or moderate dehydration can often be treated at home. Treatment should be started right away. Do not wait until dehydration becomes severe. Severe dehydration needs to be treated at the hospital. Treatment for Mild Dehydration  Drinking plenty of water to replace the fluid you have lost.   Replacing minerals in your blood (electrolytes) that you may have lost.  Treatment for Moderate Dehydration  Consuming oral rehydration solution (ORS). Treatment for Severe Dehydration  Receiving fluid through an IV tube.   Receiving electrolyte solution through  a feeding tube that is passed through your nose and into your stomach (nasogastric tube or NG tube).  Correcting any abnormalities in electrolytes. HOME CARE INSTRUCTIONS   Drink enough fluid to keep your urine clear or pale yellow.   Drink water or fluid slowly by taking small sips. You can also try sucking on ice cubes.  Have  food or beverages that contain electrolytes. Examples include bananas and sports drinks.  Take over-the-counter and prescription medicines only as told by your health care provider.   Prepare ORS according to the manufacturer's instructions. Take sips of ORS every 5 minutes until your urine returns to normal.  If you have vomiting or diarrhea, continue to try to drink water, ORS, or both.   If you have diarrhea, avoid:   Beverages that contain caffeine.   Fruit juice.   Milk.   Carbonated soft drinks.  Do not take salt tablets. This can lead to the condition of having too much sodium in your body (hypernatremia).  SEEK MEDICAL CARE IF:  You cannot eat or drink without vomiting.  You have had moderate diarrhea during a period of more than 24 hours.  You have a fever. SEEK IMMEDIATE MEDICAL CARE IF:   You have extreme thirst.  You have severe diarrhea.  You have not urinated in 6-8 hours, or you have urinated only a small amount of very dark urine.  You have shriveled skin.  You are dizzy, confused, or both.   This information is not intended to replace advice given to you by your health care provider. Make sure you discuss any questions you have with your health care provider.   Document Released: 08/13/2005 Document Revised: 05/04/2015 Document Reviewed: 12/29/2014 Elsevier Interactive Patient Education 2016 Elsevier Inc.    *UNUSUAL BRUISING OR BLEEDING  TENDERNESS IN MOUTH AND THROAT WITH OR WITHOUT PRESENCE OF ULCERS  *URINARY PROBLEMS  *BOWEL PROBLEMS  UNUSUAL RASH Items with * indicate a potential emergency and should be followed up as soon as possible.  Feel free to call the clinic you have any questions or concerns. The clinic phone number is [phone].  Please show the [facility] at check-in to the Emergency Department and triage nurse.  Pegfilgrastim injection What is this medicine? PEGFILGRASTIM (PEG fil gra stim) is a  long-acting granulocyte colony-stimulating factor that stimulates the growth of neutrophils, a type of white blood cell important in the body's fight against infection. It is used to reduce the incidence of fever and infection in patients with certain types of cancer who are receiving chemotherapy that affects the bone marrow, and to increase survival after being exposed to high doses of radiation. This medicine may be used for other purposes; ask your health care provider or pharmacist if you have questions. What should I tell my health care provider before I take this medicine? They need to know if you have any of these conditions: -kidney disease -latex allergy -ongoing radiation therapy -sickle cell disease -skin reactions to acrylic adhesives (On-Body Injector only) -an unusual or allergic reaction to pegfilgrastim, filgrastim, other medicines, foods, dyes, or preservatives -pregnant or trying to get pregnant -breast-feeding How should I use this medicine? This medicine is for injection under the skin. If you get this medicine at home, you will be taught how to prepare and give the pre-filled syringe or how to use the On-body Injector. Refer to the patient Instructions for Use for detailed instructions. Use exactly as directed. Take your medicine at regular intervals.  Do not take your medicine more often than directed. It is important that you put your used needles and syringes in a special sharps container. Do not put them in a trash can. If you do not have a sharps container, call your pharmacist or healthcare provider to get one. Talk to your pediatrician regarding the use of this medicine in children. While this drug may be prescribed for selected conditions, precautions do apply. Overdosage: If you think you have taken too much of this medicine contact a poison control center or emergency room at once. NOTE: This medicine is only for you. Do not share this medicine with others. What if I  miss a dose? It is important not to miss your dose. Call your doctor or health care professional if you miss your dose. If you miss a dose due to an On-body Injector failure or leakage, a new dose should be administered as soon as possible using a single prefilled syringe for manual use. What may interact with this medicine? Interactions have not been studied. Give your health care provider a list of all the medicines, herbs, non-prescription drugs, or dietary supplements you use. Also tell them if you smoke, drink alcohol, or use illegal drugs. Some items may interact with your medicine. This list may not describe all possible interactions. Give your health care provider a list of all the medicines, herbs, non-prescription drugs, or dietary supplements you use. Also tell them if you smoke, drink alcohol, or use illegal drugs. Some items may interact with your medicine. What should I watch for while using this medicine? You may need blood work done while you are taking this medicine. If you are going to need a MRI, CT scan, or other procedure, tell your doctor that you are using this medicine (On-Body Injector only). What side effects may I notice from receiving this medicine? Side effects that you should report to your doctor or health care professional as soon as possible: -allergic reactions like skin rash, itching or hives, swelling of the face, lips, or tongue -dizziness -fever -pain, redness, or irritation at site where injected -pinpoint red spots on the skin -red or dark-brown urine -shortness of breath or breathing problems -stomach or side pain, or pain at the shoulder -swelling -tiredness -trouble passing urine or change in the amount of urine Side effects that usually do not require medical attention (report to your doctor or health care professional if they continue or are bothersome): -bone pain -muscle pain This list may not describe all possible side effects. Call your doctor  for medical advice about side effects. You may report side effects to FDA at 1-800-FDA-1088. Where should I keep my medicine? Keep out of the reach of children. Store pre-filled syringes in a refrigerator between 2 and 8 degrees C (36 and 46 degrees F). Do not freeze. Keep in carton to protect from light. Throw away this medicine if it is left out of the refrigerator for more than 48 hours. Throw away any unused medicine after the expiration date. NOTE: This sheet is a summary. It may not cover all possible information. If you have questions about this medicine, talk to your doctor, pharmacist, or health care provider.    2016, Elsevier/Gold Standard. (2014-09-02 14:30:14)

## 2015-10-07 ENCOUNTER — Telehealth: Payer: Self-pay

## 2015-10-07 NOTE — Telephone Encounter (Signed)
2/9 pof appts made for march,patient will get a new avs at 2/17 appts

## 2015-10-14 ENCOUNTER — Encounter: Payer: Self-pay | Admitting: Nurse Practitioner

## 2015-10-14 ENCOUNTER — Ambulatory Visit (HOSPITAL_BASED_OUTPATIENT_CLINIC_OR_DEPARTMENT_OTHER): Payer: 59 | Admitting: Nurse Practitioner

## 2015-10-14 ENCOUNTER — Ambulatory Visit (HOSPITAL_COMMUNITY): Payer: 59

## 2015-10-14 ENCOUNTER — Other Ambulatory Visit: Payer: Self-pay | Admitting: *Deleted

## 2015-10-14 ENCOUNTER — Telehealth: Payer: Self-pay | Admitting: Nurse Practitioner

## 2015-10-14 ENCOUNTER — Other Ambulatory Visit (HOSPITAL_BASED_OUTPATIENT_CLINIC_OR_DEPARTMENT_OTHER): Payer: 59

## 2015-10-14 VITALS — BP 95/60 | HR 98 | Temp 97.9°F | Resp 18 | Wt 147.1 lb

## 2015-10-14 DIAGNOSIS — C50412 Malignant neoplasm of upper-outer quadrant of left female breast: Secondary | ICD-10-CM | POA: Diagnosis not present

## 2015-10-14 DIAGNOSIS — F1996 Other psychoactive substance use, unspecified with psychoactive substance-induced persisting amnestic disorder: Secondary | ICD-10-CM

## 2015-10-14 DIAGNOSIS — C773 Secondary and unspecified malignant neoplasm of axilla and upper limb lymph nodes: Secondary | ICD-10-CM

## 2015-10-14 DIAGNOSIS — K1379 Other lesions of oral mucosa: Secondary | ICD-10-CM

## 2015-10-14 DIAGNOSIS — K1231 Oral mucositis (ulcerative) due to antineoplastic therapy: Secondary | ICD-10-CM

## 2015-10-14 LAB — COMPREHENSIVE METABOLIC PANEL
ALT: 17 U/L (ref 0–55)
ANION GAP: 8 meq/L (ref 3–11)
AST: 17 U/L (ref 5–34)
Albumin: 3.6 g/dL (ref 3.5–5.0)
Alkaline Phosphatase: 82 U/L (ref 40–150)
BUN: 9.5 mg/dL (ref 7.0–26.0)
CALCIUM: 9.4 mg/dL (ref 8.4–10.4)
CO2: 25 meq/L (ref 22–29)
CREATININE: 0.8 mg/dL (ref 0.6–1.1)
Chloride: 106 mEq/L (ref 98–109)
EGFR: 80 mL/min/{1.73_m2} — ABNORMAL LOW (ref >90)
Glucose: 85 mg/dl (ref 70–140)
Potassium: 4.2 mEq/L (ref 3.5–5.1)
Sodium: 138 mEq/L (ref 136–145)
TOTAL PROTEIN: 6.8 g/dL (ref 6.4–8.3)

## 2015-10-14 LAB — CBC WITH DIFFERENTIAL/PLATELET
BASO%: 2.5 % — AB (ref 0.0–2.0)
Basophils Absolute: 0.1 10*3/uL (ref 0.0–0.1)
EOS%: 0.6 % (ref 0.0–7.0)
Eosinophils Absolute: 0 10*3/uL (ref 0.0–0.5)
HEMATOCRIT: 28.1 % — AB (ref 34.8–46.6)
HEMOGLOBIN: 9.2 g/dL — AB (ref 11.6–15.9)
LYMPH#: 1.5 10*3/uL (ref 0.9–3.3)
LYMPH%: 29.5 % (ref 14.0–49.7)
MCH: 30.2 pg (ref 25.1–34.0)
MCHC: 32.7 g/dL (ref 31.5–36.0)
MCV: 92.1 fL (ref 79.5–101.0)
MONO#: 1.6 10*3/uL — AB (ref 0.1–0.9)
MONO%: 31.3 % — ABNORMAL HIGH (ref 0.0–14.0)
NEUT%: 36.1 % — ABNORMAL LOW (ref 38.4–76.8)
NEUTROS ABS: 1.9 10*3/uL (ref 1.5–6.5)
PLATELETS: 208 10*3/uL (ref 145–400)
RBC: 3.05 10*6/uL — ABNORMAL LOW (ref 3.70–5.45)
RDW: 16.1 % — ABNORMAL HIGH (ref 11.2–14.5)
WBC: 5.2 10*3/uL (ref 3.9–10.3)

## 2015-10-14 MED ORDER — LORAZEPAM 0.5 MG PO TABS: 0.5000 mg | ORAL_TABLET | Freq: Every day | ORAL | Status: DC

## 2015-10-14 MED ORDER — ALPRAZOLAM 0.25 MG PO TABS: 0.2500 mg | ORAL_TABLET | Freq: Once | ORAL | Status: DC

## 2015-10-14 MED ORDER — PROCHLORPERAZINE MALEATE 10 MG PO TABS: 10.0000 mg | ORAL_TABLET | Freq: Four times a day (QID) | ORAL | Status: DC | PRN

## 2015-10-14 NOTE — Telephone Encounter (Signed)
Spoke with patient to confirm the appt change from 3/3 to 3/2 per 2/17 pof

## 2015-10-14 NOTE — Progress Notes (Signed)
[facility]  [phone] [phone]) [phone]   ID: [id number] APLIN DOB: [birth year]  MR#: [id number]  TUU#:[id number]  Patient Care Team: [name], MD as PCP - General (Internal Medicine) Erroll Luna, MD as Consulting Physician (General Surgery) Chauncey Cruel, MD as Consulting Physician (Oncology) [name], MD as Consulting Physician (Radiation Oncology) Aloha Gell, MD as Consulting Physician (Obstetrics and Gynecology) Harriett Sine, MD as Consulting Physician (Dermatology) PCP: [name], MD OTHER MD:  CHIEF COMPLAINT: Estrogen receptor positive breast cancer  CURRENT TREATMENT: Neoadjuvant chemotherapy  BREAST CANCER HISTORY: Alison [name] had routine screening mammography with tomography [facility] February 20 11/14/2014 showing a possible mass in the right breast. Diagnostic right mammography with right breast ultrasonography 11/01/2014 found the breast density to be category C. In the upper outer quadrant of the right breast there was a partially obscured mass which on physical exam was palpable as an area of thickening at the 10:00 position 8 cm from the nipple. Ultrasound showed multiple cysts in the upper outer right breast corresponding to the mass in question.  In November 2016 the patient felt a change in the upper left breast and brought it to her primary care physician's attention. Diagnostic left mammography with tomosynthesis and left breast ultrasonography at [facility] December 01/14/2015 found trabecular thickening throughout the left breast with skin thickening, but no circumscribed mass or suspicious calcifications. A rounded mass in the left axilla measured 6 mm. There was an additional mildly prominent lymph node in the left axilla which was what the patient had been palpating. Ultrasonography showed ill-defined swelling throughout the inner left breast with overlying skin thickening.. At the 2:00  position 4 cm from the nipple there was an irregular hypoechoic mass measuring 2.3 cm. A second mass at the 1:00 area 3 cm from the nipple measured 1 cm. The distance between these 2 masses was 1.8 cm. There was also a round hypoechoic left axillary mass measuring 0.6 cm. Overall the was an area of 3.7 cm of mixed echogenicity corresponding to the area of palpable soft tissue swelling.   On 08/04/2015 the patient underwent biopsy of the mass at the 2:00 axis 4 cm from the nipple and a second biopsy was performed of the hypoechoic mass at the 1:00 position 3 cm from the nipple. Biopsy of the suspicious nodule in the lower left axilla was attempted but could not be completed as the mass became obscured after administration of lidocaine.  The pathology from both left breast biopsies 08/04/2015 ([id number]) showed invasive ductal carcinoma, grade 3. The prognostic profile obtained from the larger tumor showed estrogen receptor to be strongly positive at 95%, progesterone receptor positive at 30%, with an MIB-1 of 12%, and no HER-2 amplification, the signals ratio being 1.22 and the number per cell 2.20.  On 08/09/2015 the patient underwent biopsy of this suspicious left axillary lymph node noted above, and this showed ([id number]) metastatic carcinoma with extracapsular extension.  The patient's subsequent history is as detailed below  INTERVAL HISTORY: Alison [name] returns for follow up of her left breast cancer, along. Today is day 8, cycle 4 of cyclophosphamide and doxorubicin x 4, with neulasta onpro given on day 2 for granulocyte support.   REVIEW OF SYSTEMS: Alison [name] was tearful during a portion of our visit today. She complains of "chemo brain" making her feel crazy. She ordered chicken noodle soup and waffle fries at [employer], meaning to have driven to [place]. She misplaced groceries,  only to find them on the top shelf of the refrigerator. Reading and focusing on recipes is no issue. She denies  fevers, chills, nausea, or vomiting. She had some constipation that was resolved with dulcolax. She is using the valtrex daily so she has no mouth sores, but her tongue and the back of her throat are tender. She denies thrush. Her bilateral hands are dry, but the clobetasol cream is helpful. She is fatigued, but no overly so. A detailed review of systems is otherwise stable.  PAST MEDICAL HISTORY: Past Medical History  Diagnosis Date  . Hypertension   . Raynaud's disease   . Skin cancer   . Rectovaginal fistula     repaired 2014  . Osteopenia determined by x-ray     PAST SURGICAL HISTORY: Past Surgical History  Procedure Laterality Date  . C-section    . Abdominal hysterectomy      with BSO  . Appendectomy    . Colon resection    . Hernia repair      FAMILY HISTORY Family History  Problem Relation Age of Onset  . Lung cancer Father    the patient's father died from lung cancer at the age of 46 in the setting of tobacco abuse. The patient's mother died at the age of 8 with pancreatic cancer. The patient had no siblings. There is no history of breast or ovarian cancer in the family.  GYNECOLOGIC HISTORY:  No LMP recorded. Patient has had a hysterectomy. Menarche age 49, first live birth age 68. The patient is GX P1. She is status post total hysterectomy with bilateral salpingo-oophorectomy. She has been on hormone replacement since that time and is tapering to off at the time of this dictation.   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 She used to work at [employer]. Her husband [name] works for [employer] division at a [employer] their son [name] is currently at home.    ADVANCED DIRECTIVES: Not in place   HEALTH MAINTENANCE: Social History  Substance Use Topics  . Smoking status: Former Smoker -- 1.00 packs/day    Types: Cigarettes    Quit date: 11/25/2008  . Smokeless tobacco: Not on file  . Alcohol Use: No     Colonoscopy: April  2014/ Raynaldo Opitz [facility]  PAP: s/p hysterectomy  Bone density: August 2016/ osteopenia  Lipid panel:  Allergies  Allergen Reactions  . Sulfa Antibiotics Other (See Comments)    Blisters in mouth    Current Outpatient Prescriptions  Medication Sig Dispense Refill  . cetirizine (ZYRTEC) 10 MG tablet Take 10 mg by mouth daily.    . Cholecalciferol (VITAMIN D3) 2000 UNITS capsule Take 2,000 Units by mouth daily.     . clobetasol cream (TEMOVATE) 2.45 % Apply 1 application topically 2 (two) times daily. 30 g 0  . dexamethasone (DECADRON) 4 MG tablet Take 2 tablets by mouth once a day on the day after chemotherapy and then take 2 tablets two times a day for 2 days. Take with food. 30 tablet 1  . dextroamphetamine (DEXEDRINE SPANSULE) 15 MG 24 hr capsule Take 15 mg by mouth 2 (two) times daily.     Marland Kitchen doxycycline (VIBRA-TABS) 100 MG tablet Take 1 tablet (100 mg total) by mouth daily. 30 tablet 1  . Fenofibric Acid 105 MG TABS Take 1 tablet by mouth daily.    . fluticasone (FLONASE) 50 MCG/ACT nasal spray Place 2 sprays into both nostrils daily. Reported on 09/08/2015    .  gabapentin (NEURONTIN) 300 MG capsule Take 1 capsule (300 mg total) by mouth at bedtime. 90 capsule 4  . Icosapent Ethyl (VASCEPA) 1 G CAPS Take 2 capsules by mouth 2 (two) times daily.    Marland Kitchen lidocaine-prilocaine (EMLA) cream Apply to affected area once 30 g 3  . Multiple Vitamin (MULTIVITAMIN) tablet Take 1 tablet by mouth daily.    Marland Kitchen NIFEdipine (PROCARDIA-XL/ADALAT-CC/NIFEDICAL-XL) 30 MG 24 hr tablet Take 30 mg by mouth daily.     Marland Kitchen olmesartan (BENICAR) 40 MG tablet Take 40 mg by mouth daily.    Marland Kitchen omeprazole (PRILOSEC) 40 MG capsule Take 1 capsule (40 mg total) by mouth daily. 30 capsule 2  . ondansetron (ZOFRAN) 8 MG tablet Take 1 tablet (8 mg total) by mouth 2 (two) times daily as needed. Start on the third day after chemotherapy. 30 tablet 1  . polyethylene glycol (MIRALAX / GLYCOLAX) packet Take 17 g by mouth daily.  Reported on 10/06/2015    . Probiotic Product (PROBIOTIC DAILY PO) Take 1 capsule by mouth daily.     . traZODone (DESYREL) 150 MG tablet Take 150 mg by mouth at bedtime.    Marland Kitchen UNABLE TO FIND Take 2 tablets by mouth at bedtime. Zzz Quil    . valACYclovir (VALTREX) 500 MG tablet Take 1 tablet (500 mg total) by mouth 2 (two) times daily. 30 tablet 1  . ALPRAZolam (XANAX) 0.25 MG tablet Take 1 tablet (0.25 mg total) by mouth once. 3 tablet 0  . LORazepam (ATIVAN) 0.5 MG tablet Take 1 tablet (0.5 mg total) by mouth at bedtime. 30 tablet 0  . prochlorperazine (COMPAZINE) 10 MG tablet Take 1 tablet (10 mg total) by mouth every 6 (six) hours as needed (Nausea or vomiting). 30 tablet 1   No current facility-administered medications for this visit.    OBJECTIVE: Middle-aged white woman who appears stated age  5 Vitals:   10/14/15 1259  BP: 95/60  Pulse: 98  Temp: 97.9 F (36.6 C)  Resp: 18     Body mass index is 26.06 kg/(m^2).    ECOG FS:1 - Symptomatic but completely ambulatory  Skin: warm, dry, papular erythematous rash to chest, excessively dry hands but no cracking or peeling HEENT: sclerae anicteric, conjunctivae pink, oropharynx clear. No thrush or mucositis.  Lymph Nodes: No cervical or supraclavicular lymphadenopathy  Lungs: clear to auscultation bilaterally, no rales, wheezes, or rhonci  Heart: regular rate and rhythm  Abdomen: round, soft, non tender, positive bowel sounds  Musculoskeletal: No focal spinal tenderness, no peripheral edema  Neuro: non focal, well oriented, positive affect  Breasts: deferred  LAB RESULTS:  CMP     Component Value Date/Time   NA 138 10/14/2015 1228   K 4.2 10/14/2015 1228   CO2 25 10/14/2015 1228   GLUCOSE 85 10/14/2015 1228   BUN 9.5 10/14/2015 1228   CREATININE 0.8 10/14/2015 1228   CALCIUM 9.4 10/14/2015 1228   PROT 6.8 10/14/2015 1228   ALBUMIN 3.6 10/14/2015 1228   AST 17 10/14/2015 1228   ALT 17 10/14/2015 1228   ALKPHOS 82  10/14/2015 1228   BILITOT <0.30 10/14/2015 1228    INo results found for: SPEP, UPEP  Lab Results  Component Value Date   WBC 5.2 10/14/2015   NEUTROABS 1.9 10/14/2015   HGB 9.2* 10/14/2015   HCT 28.1* 10/14/2015   MCV 92.1 10/14/2015   PLT 208 10/14/2015      Chemistry      Component Value Date/Time  NA 138 10/14/2015 1228   K 4.2 10/14/2015 1228   CO2 25 10/14/2015 1228   BUN 9.5 10/14/2015 1228   CREATININE 0.8 10/14/2015 1228      Component Value Date/Time   CALCIUM 9.4 10/14/2015 1228   ALKPHOS 82 10/14/2015 1228   AST 17 10/14/2015 1228   ALT 17 10/14/2015 1228   BILITOT <0.30 10/14/2015 1228       No results found for: LABCA2  No components found for: LABCA125  No results for input(s): INR in the last 168 hours.  Urinalysis No results found for: COLORURINE, APPEARANCEUR, LABSPEC, PHURINE, GLUCOSEU, HGBUR, BILIRUBINUR, KETONESUR, PROTEINUR, UROBILINOGEN, NITRITE, LEUKOCYTESUR  STUDIES: No results found.  ASSESSMENT: 55 y.o. [name] woman status post 2 separate masses in the left breast 08/04/2015, clinically mT2 N1, stage IIb/IIIa invasive ductal carcinoma, grade 3, the larger mass being estrogen and progesterone receptor positive, HER-2 negative, with an MIB-1 of 12%  (a) biopsy of a left axillary lymph node 08/09/2015 was positive, with extracapsular extension  (1) neoadjuvant chemotherapy will consist of doxorubicin and cyclophosphamide in dose dense fashion 4 followed by weekly paclitaxel 12  (2) definitive surgery to follow  (3) adjuvant radiation to follow surgery  (4) anti-estrogens to follow completion of local treatment  (a) bone density August 2016 showed osteopenia  (b) the patient is status post total abdominal hysterectomy with bilateral salpingo-oophorectomy.  (5) the patient appears to be a good candidate for the Alliance trial  PLAN: Alison [name] has successfully completed the first portion of her neoadjuvant therapy. I am adding  magic mouthwash (without lidocaine) to swish and swallow TID. She will continue on the valtrex daily. For her "chemo brain" we discussed making checklists and using written notes to help organize her tasks for the day. She may be interested in a neuropsychology evaluation at the completion of chemo.   She is scheduled for her midpoint breast MRI on 2/22.   Alison [name] will return in 1 week for follow up with Dr. [name]. During this visit they will review the results of her scan and discuss her next regimen, paclitaxel weekly x12.  She understands and agrees with this plan. She knows the goal of treatment in her case is cure. She has been encouraged to call with any issues that might arise before her next visit here.   Laurie Panda, NP   10/14/2015 2:37 PM

## 2015-10-19 ENCOUNTER — Other Ambulatory Visit: Payer: Self-pay | Admitting: Nurse Practitioner

## 2015-10-19 ENCOUNTER — Ambulatory Visit (HOSPITAL_COMMUNITY)
Admission: RE | Admit: 2015-10-19 | Discharge: 2015-10-19 | Disposition: A | Discharge status: Home / Self Care | Payer: 59 | Source: Ambulatory Visit | Attending: Nurse Practitioner | Admitting: Nurse Practitioner

## 2015-10-19 DIAGNOSIS — Z9221 Personal history of antineoplastic chemotherapy: Secondary | ICD-10-CM | POA: Insufficient documentation

## 2015-10-19 DIAGNOSIS — C50412 Malignant neoplasm of upper-outer quadrant of left female breast: Secondary | ICD-10-CM

## 2015-10-19 MED ORDER — GADOBENATE DIMEGLUMINE 529 MG/ML IV SOLN
15.0000 mL | Freq: Once | INTRAVENOUS | Status: AC | PRN
  Administered 2015-10-19: 14 mL via INTRAVENOUS

## 2015-10-20 ENCOUNTER — Ambulatory Visit (HOSPITAL_BASED_OUTPATIENT_CLINIC_OR_DEPARTMENT_OTHER): Payer: 59 | Admitting: Oncology

## 2015-10-20 ENCOUNTER — Other Ambulatory Visit (HOSPITAL_BASED_OUTPATIENT_CLINIC_OR_DEPARTMENT_OTHER): Payer: 59

## 2015-10-20 ENCOUNTER — Ambulatory Visit (HOSPITAL_BASED_OUTPATIENT_CLINIC_OR_DEPARTMENT_OTHER): Payer: 59

## 2015-10-20 ENCOUNTER — Encounter: Payer: Self-pay | Admitting: *Deleted

## 2015-10-20 VITALS — BP 93/49 | HR 105 | Temp 98.3°F | Resp 18 | Wt 150.1 lb

## 2015-10-20 VITALS — BP 142/70 | HR 91 | Temp 97.9°F | Resp 18

## 2015-10-20 DIAGNOSIS — C773 Secondary and unspecified malignant neoplasm of axilla and upper limb lymph nodes: Secondary | ICD-10-CM | POA: Diagnosis not present

## 2015-10-20 DIAGNOSIS — C50412 Malignant neoplasm of upper-outer quadrant of left female breast: Secondary | ICD-10-CM | POA: Diagnosis not present

## 2015-10-20 DIAGNOSIS — I952 Hypotension due to drugs: Secondary | ICD-10-CM

## 2015-10-20 DIAGNOSIS — B372 Candidiasis of skin and nail: Secondary | ICD-10-CM

## 2015-10-20 DIAGNOSIS — Z5111 Encounter for antineoplastic chemotherapy: Secondary | ICD-10-CM

## 2015-10-20 LAB — CBC WITH DIFFERENTIAL/PLATELET
BASO%: 1.7 % (ref 0.0–2.0)
BASOS ABS: 0.2 10*3/uL — AB (ref 0.0–0.1)
EOS ABS: 0 10*3/uL (ref 0.0–0.5)
EOS%: 0.3 % (ref 0.0–7.0)
HEMATOCRIT: 26.3 % — AB (ref 34.8–46.6)
HEMOGLOBIN: 8.9 g/dL — AB (ref 11.6–15.9)
LYMPH#: 1.3 10*3/uL (ref 0.9–3.3)
LYMPH%: 13 % — ABNORMAL LOW (ref 14.0–49.7)
MCH: 31.4 pg (ref 25.1–34.0)
MCHC: 33.9 g/dL (ref 31.5–36.0)
MCV: 92.7 fL (ref 79.5–101.0)
MONO#: 1 10*3/uL — AB (ref 0.1–0.9)
MONO%: 10.8 % (ref 0.0–14.0)
NEUT#: 7.2 10*3/uL — ABNORMAL HIGH (ref 1.5–6.5)
NEUT%: 74.2 % (ref 38.4–76.8)
PLATELETS: 271 10*3/uL (ref 145–400)
RBC: 2.83 10*6/uL — AB (ref 3.70–5.45)
RDW: 17.7 % — AB (ref 11.2–14.5)
WBC: 9.7 10*3/uL (ref 3.9–10.3)

## 2015-10-20 LAB — COMPREHENSIVE METABOLIC PANEL
ALBUMIN: 3.2 g/dL — AB (ref 3.5–5.0)
ALK PHOS: 80 U/L (ref 40–150)
ALT: 13 U/L (ref 0–55)
ANION GAP: 9 meq/L (ref 3–11)
AST: 14 U/L (ref 5–34)
BUN: 16.5 mg/dL (ref 7.0–26.0)
CO2: 21 meq/L — AB (ref 22–29)
Calcium: 8.9 mg/dL (ref 8.4–10.4)
Chloride: 108 mEq/L (ref 98–109)
Creatinine: 0.8 mg/dL (ref 0.6–1.1)
EGFR: 78 mL/min/{1.73_m2} — AB (ref >90)
Glucose: 129 mg/dl (ref 70–140)
POTASSIUM: 3.9 meq/L (ref 3.5–5.1)
Sodium: 138 mEq/L (ref 136–145)
TOTAL PROTEIN: 6.2 g/dL — AB (ref 6.4–8.3)

## 2015-10-20 MED ORDER — DIPHENHYDRAMINE HCL 50 MG/ML IJ SOLN
INTRAMUSCULAR | Status: AC
  Filled 2015-10-20: qty 1

## 2015-10-20 MED ORDER — PACLITAXEL CHEMO INJECTION 300 MG/50ML
80.0000 mg/m2 | Freq: Once | INTRAVENOUS | Status: AC
  Administered 2015-10-20: 138 mg via INTRAVENOUS
  Filled 2015-10-20: qty 23

## 2015-10-20 MED ORDER — SODIUM CHLORIDE 0.9 % IV SOLN
Freq: Once | INTRAVENOUS | Status: AC
  Administered 2015-10-20: 10:00:00 via INTRAVENOUS

## 2015-10-20 MED ORDER — SODIUM CHLORIDE 0.9% FLUSH
10.0000 mL | INTRAVENOUS | Status: DC | PRN
  Administered 2015-10-20: 10 mL
  Filled 2015-10-20: qty 10

## 2015-10-20 MED ORDER — FAMOTIDINE IN NACL 20-0.9 MG/50ML-% IV SOLN
20.0000 mg | Freq: Once | INTRAVENOUS | Status: AC
  Administered 2015-10-20: 20 mg via INTRAVENOUS

## 2015-10-20 MED ORDER — SODIUM CHLORIDE 0.9 % IV SOLN
20.0000 mg | Freq: Once | INTRAVENOUS | Status: AC
  Administered 2015-10-20: 20 mg via INTRAVENOUS
  Filled 2015-10-20: qty 2

## 2015-10-20 MED ORDER — DIPHENHYDRAMINE HCL 50 MG/ML IJ SOLN
25.0000 mg | Freq: Once | INTRAMUSCULAR | Status: AC
  Administered 2015-10-20: 25 mg via INTRAVENOUS

## 2015-10-20 MED ORDER — HEPARIN SOD (PORK) LOCK FLUSH 100 UNIT/ML IV SOLN
500.0000 [IU] | Freq: Once | INTRAVENOUS | Status: AC | PRN
  Administered 2015-10-20: 500 [IU]
  Filled 2015-10-20: qty 5

## 2015-10-20 MED ORDER — FLUCONAZOLE 100 MG PO TABS: 100.0000 mg | ORAL_TABLET | Freq: Every day | ORAL | Status: DC

## 2015-10-20 MED ORDER — OLMESARTAN MEDOXOMIL 20 MG PO TABS: 20.0000 mg | ORAL_TABLET | Freq: Every day | ORAL | Status: DC

## 2015-10-20 MED ORDER — FAMOTIDINE IN NACL 20-0.9 MG/50ML-% IV SOLN
INTRAVENOUS | Status: AC
  Filled 2015-10-20: qty 50

## 2015-10-20 NOTE — Addendum Note (Signed)
Addended by: Chauncey Cruel on: 10/20/2015 05:04 PM   Modules accepted: Orders

## 2015-10-20 NOTE — Progress Notes (Signed)
Alison [name]  [phone] Fax:([phone]) [phone]   ID: CLIMMIE CRONCE DOB: 1960-09-26  MR#: [id number]  UDJ#:[id number]  Patient Care Team: [name], MD as PCP - General (Internal Medicine) Erroll Luna, MD as Consulting Physician (General Surgery) Chauncey Cruel, MD as Consulting Physician (Oncology) [name], MD as Consulting Physician (Radiation Oncology) Aloha Gell, MD as Consulting Physician (Obstetrics and Gynecology) Harriett Sine, MD as Consulting Physician (Dermatology) PCP: [name], MD OTHER MD:  CHIEF COMPLAINT: Estrogen receptor positive breast cancer  CURRENT TREATMENT: Neoadjuvant chemotherapy  BREAST CANCER HISTORY: From the original intake note:  [name] had routine screening mammography with tomography [facility] February 20 11/14/2014 showing a possible mass in the right breast. Diagnostic right mammography with right breast ultrasonography 11/01/2014 found the breast density to be category C. In the upper outer quadrant of the right breast there was a partially obscured mass which on physical exam was palpable as an area of thickening at the 10:00 position 8 cm from the nipple. Ultrasound showed multiple cysts in the upper outer right breast corresponding to the mass in question.  In November 2016 the patient felt a change in the upper left breast and brought it to her primary care physician's attention. Diagnostic left mammography with tomosynthesis and left breast ultrasonography at [facility] December 01/14/2015 found trabecular thickening throughout the left breast with skin thickening, but no circumscribed mass or suspicious calcifications. A rounded mass in the left axilla measured 6 mm. There was an additional mildly prominent lymph node in the left axilla which was what the patient had been palpating. Ultrasonography showed ill-defined swelling throughout the inner left breast with overlying  skin thickening.. At the 2:00 position 4 cm from the nipple there was an irregular hypoechoic mass measuring 2.3 cm. A second mass at the 1:00 area 3 cm from the nipple measured 1 cm. The distance between these 2 masses was 1.8 cm. There was also a round hypoechoic left axillary mass measuring 0.6 cm. Overall the was an area of 3.7 cm of mixed echogenicity corresponding to the area of palpable soft tissue swelling.   On 08/04/2015 the patient underwent biopsy of the mass at the 2:00 axis 4 cm from the nipple and a second biopsy was performed of the hypoechoic mass at the 1:00 position 3 cm from the nipple. Biopsy of the suspicious nodule in the lower left axilla was attempted but could not be completed as the mass became obscured after administration of lidocaine.  The pathology from both left breast biopsies 08/04/2015 ([id number]) showed invasive ductal carcinoma, grade 3. The prognostic profile obtained from the larger tumor showed estrogen receptor to be strongly positive at 95%, progesterone receptor positive at 30%, with an MIB-1 of 12%, and no HER-2 amplification, the signals ratio being 1.22 and the number per cell 2.20.  On 08/09/2015 the patient underwent biopsy of this suspicious left axillary lymph node noted above, and this showed ([id number]) metastatic carcinoma with extracapsular extension.  The patient's subsequent history is as detailed below  INTERVAL HISTORY: Alison [name] returns for follow up of her estrogen receptor positive breast cancer accompanied by her husband. Today is day 1, cycle 1 of 12 planned cycles of weekly paclitaxel.Marland Kitchen   REVIEW OF SYSTEMS: Kiauna did very well with the initial part of her chemotherapy, namely the Cytoxan and Adriamycin. The worse problems she had she says were insomnia and fatigue. She did develop mucositis with the third cycle and  was started on Valtrex which took care of that problem. She also has a rash chiefly over the upper anterior chest. Her  upper back is a little itchy as well. Her hands are dry. Nausea was never a significant problem and she never did have any vomiting, fever, or bleeding complications. Her access continues to work well. She says she is developing "chemotherapy brain". A detailed review of systems today was otherwise stable  PAST MEDICAL HISTORY: Past Medical History  Diagnosis Date  . Hypertension   . Raynaud's disease   . Skin cancer   . Rectovaginal fistula     repaired 2014  . Osteopenia determined by x-ray     PAST SURGICAL HISTORY: Past Surgical History  Procedure Laterality Date  . C-section    . Abdominal hysterectomy      with BSO  . Appendectomy    . Colon resection    . Hernia repair      FAMILY HISTORY Family History  Problem Relation Age of Onset  . Lung cancer Father    the patient's father died from lung cancer at the age of 70 in the setting of tobacco abuse. The patient's mother died at the age of 27 with pancreatic cancer. The patient had no siblings. There is no history of breast or ovarian cancer in the family.  GYNECOLOGIC HISTORY:  No LMP recorded. Patient has had a hysterectomy. Menarche age 55, first live birth age 59. The patient is GX P1. She is status post total hysterectomy with bilateral salpingo-oophorectomy. She has been on hormone replacement since that time and is tapering to off at the time of this dictation.   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 She used to work at [employer]. Her husband [name] works for [employer] division at a [employer] their son [name] is currently at home.    ADVANCED DIRECTIVES: Not in place   HEALTH MAINTENANCE: Social History  Substance Use Topics  . Smoking status: Former Smoker -- 1.00 packs/day    Types: Cigarettes    Quit date: 11/25/2008  . Smokeless tobacco: Not on file  . Alcohol Use: No     Colonoscopy: April 2014/ Raynaldo Opitz at [facility]  PAP: s/p hysterectomy  Bone density: August  2016/ osteopenia  Lipid panel:  Allergies  Allergen Reactions  . Sulfa Antibiotics Other (See Comments)    Blisters in mouth    Current Outpatient Prescriptions  Medication Sig Dispense Refill  . ALPRAZolam (XANAX) 0.25 MG tablet Take 1 tablet (0.25 mg total) by mouth once. 3 tablet 0  . cetirizine (ZYRTEC) 10 MG tablet Take 10 mg by mouth daily.    . Cholecalciferol (VITAMIN D3) 2000 UNITS capsule Take 2,000 Units by mouth daily.     . clobetasol cream (TEMOVATE) 9.50 % Apply 1 application topically 2 (two) times daily. 30 g 0  . dextroamphetamine (DEXEDRINE SPANSULE) 15 MG 24 hr capsule Take 15 mg by mouth 2 (two) times daily.     Marland Kitchen doxycycline (VIBRA-TABS) 100 MG tablet Take 1 tablet (100 mg total) by mouth daily. 30 tablet 1  . Fenofibric Acid 105 MG TABS Take 1 tablet by mouth daily.    . fluticasone (FLONASE) 50 MCG/ACT nasal spray Place 2 sprays into both nostrils daily. Reported on 09/08/2015    . gabapentin (NEURONTIN) 300 MG capsule Take 1 capsule (300 mg total) by mouth at bedtime. 90 capsule 4  . Icosapent Ethyl (VASCEPA) 1 G CAPS Take 2 capsules  by mouth 2 (two) times daily.    Marland Kitchen LORazepam (ATIVAN) 0.5 MG tablet Take 1 tablet (0.5 mg total) by mouth at bedtime. 30 tablet 0  . Multiple Vitamin (MULTIVITAMIN) tablet Take 1 tablet by mouth daily.    Marland Kitchen NIFEdipine (PROCARDIA-XL/ADALAT-CC/NIFEDICAL-XL) 30 MG 24 hr tablet Take 30 mg by mouth daily.     Marland Kitchen olmesartan (BENICAR) 40 MG tablet Take 40 mg by mouth daily.    Marland Kitchen omeprazole (PRILOSEC) 40 MG capsule Take 1 capsule (40 mg total) by mouth daily. 30 capsule 2  . polyethylene glycol (MIRALAX / GLYCOLAX) packet Take 17 g by mouth daily. Reported on 10/06/2015    . Probiotic Product (PROBIOTIC DAILY PO) Take 1 capsule by mouth daily.     . prochlorperazine (COMPAZINE) 10 MG tablet Take 1 tablet (10 mg total) by mouth every 6 (six) hours as needed (Nausea or vomiting). 30 tablet 1  . traZODone (DESYREL) 150 MG tablet Take 150 mg by  mouth at bedtime.    Marland Kitchen UNABLE TO FIND Take 2 tablets by mouth at bedtime. Zzz Quil    . valACYclovir (VALTREX) 500 MG tablet Take 1 tablet (500 mg total) by mouth 2 (two) times daily. 30 tablet 1   No current facility-administered medications for this visit.    OBJECTIVE: Middle-aged white woman in no acute distress Filed Vitals:   10/20/15 0907  BP: 93/49  Pulse: 105  Temp: 98.3 F (36.8 C)  Resp: 18     Body mass index is 26.6 kg/(m^2).    ECOG FS:1 - Symptomatic but completely ambulatory  Sclerae unicteric, pupils round and equal Oropharynx clear and moist-- no thrush or other lesions No cervical or supraclavicular adenopathy Lungs no rales or rhonchi Heart regular rate and rhythm Abd soft, obese, nontender, positive bowel sounds MSK no focal spinal tenderness, no upper extremity lymphedema Neuro: nonfocal, well oriented, appropriate affect Breasts: I do not palpate any mass in the left axilla    LAB RESULTS:  CMP     Component Value Date/Time   NA 138 10/14/2015 1228   K 4.2 10/14/2015 1228   CO2 25 10/14/2015 1228   GLUCOSE 85 10/14/2015 1228   BUN 9.5 10/14/2015 1228   CREATININE 0.8 10/14/2015 1228   CALCIUM 9.4 10/14/2015 1228   PROT 6.8 10/14/2015 1228   ALBUMIN 3.6 10/14/2015 1228   AST 17 10/14/2015 1228   ALT 17 10/14/2015 1228   ALKPHOS 82 10/14/2015 1228   BILITOT <0.30 10/14/2015 1228    INo results found for: SPEP, UPEP  Lab Results  Component Value Date   WBC 9.7 10/20/2015   NEUTROABS 7.2* 10/20/2015   HGB 8.9* 10/20/2015   HCT 26.3* 10/20/2015   MCV 92.7 10/20/2015   PLT 271 10/20/2015      Chemistry      Component Value Date/Time   NA 138 10/14/2015 1228   K 4.2 10/14/2015 1228   CO2 25 10/14/2015 1228   BUN 9.5 10/14/2015 1228   CREATININE 0.8 10/14/2015 1228      Component Value Date/Time   CALCIUM 9.4 10/14/2015 1228   ALKPHOS 82 10/14/2015 1228   AST 17 10/14/2015 1228   ALT 17 10/14/2015 1228   BILITOT <0.30  10/14/2015 1228       No results found for: LABCA2  No components found for: LABCA125  No results for input(s): INR in the last 168 hours.  Urinalysis No results found for: COLORURINE, APPEARANCEUR, [place], [place], [place], [place], [place], [place], [place],  UROBILINOGEN, NITRITE, LEUKOCYTESUR  STUDIES: Mr Breast Bilateral W Wo Contrast  10/19/2015  CLINICAL DATA:  Patient with known left breast carcinoma. Restaging following neoadjuvant chemotherapy. LABS:  Most recent labs obtained on 10/14/2015. Creatinine 0.8. Normal GFR. EXAM: BILATERAL BREAST MRI WITH AND WITHOUT CONTRAST TECHNIQUE: Multiplanar, multisequence MR images of both breasts were obtained prior to and following the intravenous administration of 14 ml of MultiHance. THREE-DIMENSIONAL MR IMAGE RENDERING ON INDEPENDENT WORKSTATION: Three-dimensional MR images were rendered by post-processing of the original MR data on an independent workstation. The three-dimensional MR images were interpreted, and findings are reported in the following complete MRI report for this study. Three dimensional images were evaluated at the independent DynaCad workstation COMPARISON:  Previous mammograms and ultrasounds. Prior breast MRI, 08/16/2015. FINDINGS: Breast composition: c. Heterogeneous fibroglandular tissue. Background parenchymal enhancement: Mild Right breast: There are no areas of abnormal enhancement. Left breast: The previously seen diffuse breast enhancement has significantly improved with neoadjuvant chemotherapy. There is a focal area of enhancement in the upper outer quadrant measuring 10 x 8 x 12 mm. This lies adjacent to the biopsy clip. A smaller, 5 mm, area of enhancement lies posterior to this. There is some generalized hazy non mass enhancement that persists most evident in the medial aspect of the breast. Lymph nodes: No abnormal appearing lymph nodes. Ancillary findings:  None. IMPRESSION: 1. Positive response to  neoadjuvant chemotherapy. There has been a significant reduction in the diffuse abnormal left breast enhancement seen on the prior MRI. There is a focal, 10 x 8 x 12 mm area of enhancement near the biopsy clip in the upper outer left breast. Also some mild, regional, residual non mass enhancement along the medial aspect of the left breast. 2. No evidence of malignancy in the right breast. 3. No adenopathy. RECOMMENDATION: Treatment as planned for the known left breast carcinoma. BI-RADS CATEGORY  6: Known biopsy-proven malignancy. Electronically Signed   By: Lajean Manes M.D.   On: 10/19/2015 15:41    ASSESSMENT: 55 y.o. [place] woman status post 2 separate masses in the left breast 08/04/2015, clinically mT2 N1, stage IIb/IIIa invasive ductal carcinoma, grade 3, the larger mass being estrogen and progesterone receptor positive, HER-2 negative, with an MIB-1 of 12%  (a) biopsy of a left axillary lymph node 08/09/2015 was positive, with extracapsular extension  (1) neoadjuvant chemotherapy started 08/25/2015 consisting of doxorubicin and cyclophosphamide in dose dense fashion 4, completed 10/06/2015,  followed by weekly paclitaxel 12  (2) definitive surgery to follow  (3) adjuvant radiation to follow surgery  (4) anti-estrogens to follow completion of local treatment  (a) bone density August 2016 showed osteopenia  (b) the patient is status post total abdominal hysterectomy with bilateral salpingo-oophorectomy.  (5) the patient appears to be a good candidate for the Alliance trial  PLAN: Hilari has had a good initial response to chemotherapy and I think lumpectomy may well be a consideration. Of course she still has 12 weeks of Taxol to go through and we discussed that at length today.  She understands that Taxol does not cause the low counts that her earlier chemotherapy did so she will not need the Neulasta support. It also does not cause significant problems with nausea. She will not  need to take Decadron. She will use only Zofran. She will use Compazine as a backup.  Lyda Perone is beginning to think about whether she wants mastectomy or lumpectomy and whether she wants reconstruction or not. She has been discussing this with other patients  and she has been told that Dr. Barry Dienes is somebody she should see for second opinion. I will be glad to facilitate that if that is okay with the surgical group.  I did show her the image of her mid course MRI, which is very favorable.  Her blood pressure has dropped some. Chemotherapy can lower a person's blood pressure. I am dropping her olmesartan to 20 mg daily. We will keep an eye on this problem.  I think the rash she has on the front of her chest is candidal and I have started her on Diflucan. If she does not have a good response after a few days on that drug we will try a steroid cream next visit. The doxycycline she tried initially did not really do much. Quite aside from that rash she does have dry skin and she needs to start using emollients.  I did alert her to the possibility of neuropathy developing with Taxol. She is also aware of problems with nails that may develop with these treatments. She will let us know if those or any other symptoms develop.  Otherwise she will start her Taxol today. The plan is to continue that on a weekly basis for the next 12 weeks.   Chauncey Cruel, MD   10/20/2015 9:20 AM

## 2015-10-25 ENCOUNTER — Telehealth: Payer: Self-pay | Admitting: Oncology

## 2015-10-25 NOTE — Telephone Encounter (Signed)
Left message for pt to inform her od appt with Dr. Barry Dienes March 31 at [facility] am [address] # [place], [place], [place] [address]   [phone]

## 2015-10-27 ENCOUNTER — Ambulatory Visit (HOSPITAL_BASED_OUTPATIENT_CLINIC_OR_DEPARTMENT_OTHER): Payer: 59 | Admitting: Nurse Practitioner

## 2015-10-27 ENCOUNTER — Encounter: Payer: Self-pay | Admitting: Nurse Practitioner

## 2015-10-27 ENCOUNTER — Other Ambulatory Visit (HOSPITAL_BASED_OUTPATIENT_CLINIC_OR_DEPARTMENT_OTHER): Payer: 59

## 2015-10-27 VITALS — BP 107/61 | HR 107 | Temp 98.1°F | Resp 18 | Ht 63.0 in | Wt 152.1 lb

## 2015-10-27 DIAGNOSIS — C50412 Malignant neoplasm of upper-outer quadrant of left female breast: Secondary | ICD-10-CM

## 2015-10-27 DIAGNOSIS — D6481 Anemia due to antineoplastic chemotherapy: Secondary | ICD-10-CM | POA: Diagnosis not present

## 2015-10-27 DIAGNOSIS — T451X5A Adverse effect of antineoplastic and immunosuppressive drugs, initial encounter: Secondary | ICD-10-CM

## 2015-10-27 DIAGNOSIS — R21 Rash and other nonspecific skin eruption: Secondary | ICD-10-CM

## 2015-10-27 DIAGNOSIS — G4700 Insomnia, unspecified: Secondary | ICD-10-CM

## 2015-10-27 DIAGNOSIS — C773 Secondary and unspecified malignant neoplasm of axilla and upper limb lymph nodes: Secondary | ICD-10-CM

## 2015-10-27 LAB — COMPREHENSIVE METABOLIC PANEL
ALT: 18 U/L (ref 0–55)
AST: 18 U/L (ref 5–34)
Albumin: 3.5 g/dL (ref 3.5–5.0)
Alkaline Phosphatase: 55 U/L (ref 40–150)
Anion Gap: 8 mEq/L (ref 3–11)
BUN: 11.3 mg/dL (ref 7.0–26.0)
CHLORIDE: 108 meq/L (ref 98–109)
CO2: 21 mEq/L — ABNORMAL LOW (ref 22–29)
Calcium: 9.5 mg/dL (ref 8.4–10.4)
Creatinine: 0.9 mg/dL (ref 0.6–1.1)
EGFR: 74 mL/min/{1.73_m2} — ABNORMAL LOW (ref >90)
GLUCOSE: 139 mg/dL (ref 70–140)
POTASSIUM: 4.4 meq/L (ref 3.5–5.1)
SODIUM: 137 meq/L (ref 136–145)
Total Bilirubin: <0.3 mg/dL (ref 0.20–1.20)
Total Protein: 6.4 g/dL (ref 6.4–8.3)

## 2015-10-27 LAB — CBC WITH DIFFERENTIAL/PLATELET
BASO%: 2.2 % — ABNORMAL HIGH (ref 0.0–2.0)
BASOS ABS: 0.1 10*3/uL (ref 0.0–0.1)
EOS%: 1.2 % (ref 0.0–7.0)
Eosinophils Absolute: 0.1 10*3/uL (ref 0.0–0.5)
HCT: 25.6 % — ABNORMAL LOW (ref 34.8–46.6)
HGB: 8.5 g/dL — ABNORMAL LOW (ref 11.6–15.9)
LYMPH%: 21.9 % (ref 14.0–49.7)
MCH: 30.7 pg (ref 25.1–34.0)
MCHC: 33.4 g/dL (ref 31.5–36.0)
MCV: 92 fL (ref 79.5–101.0)
MONO#: 0.6 10*3/uL (ref 0.1–0.9)
MONO%: 11.1 % (ref 0.0–14.0)
NEUT#: 3.7 10*3/uL (ref 1.5–6.5)
NEUT%: 63.6 % (ref 38.4–76.8)
Platelets: 375 10*3/uL (ref 145–400)
RBC: 2.78 10*6/uL — AB (ref 3.70–5.45)
RDW: 18.7 % — AB (ref 11.2–14.5)
WBC: 5.8 10*3/uL (ref 3.9–10.3)
lymph#: 1.3 10*3/uL (ref 0.9–3.3)

## 2015-10-27 MED ORDER — FLUCONAZOLE 100 MG PO TABS: 100.0000 mg | ORAL_TABLET | Freq: Every day | ORAL | Status: DC

## 2015-10-27 NOTE — Progress Notes (Signed)
[place]  [phone] Fax:([phone]) [phone]   ID: SHAKIERA EDELSON DOB: 02/29/1961  MR#: [id number]  OMA#:[id number]  Patient Care Team: [name], MD as PCP - General (Internal Medicine) Erroll Luna, MD as Consulting Physician (General Surgery) Chauncey Cruel, MD as Consulting Physician (Oncology) [name], MD as Consulting Physician (Radiation Oncology) Aloha Gell, MD as Consulting Physician (Obstetrics and Gynecology) Harriett Sine, MD as Consulting Physician (Dermatology) PCP: [name], MD OTHER MD:  CHIEF COMPLAINT: Estrogen receptor positive breast cancer  CURRENT TREATMENT: Neoadjuvant chemotherapy  BREAST CANCER HISTORY: From the original intake note:  Elizabth had routine screening mammography with tomography [facility] February 20 11/14/2014 showing a possible mass in the right breast. Diagnostic right mammography with right breast ultrasonography 11/01/2014 found the breast density to be category C. In the upper outer quadrant of the right breast there was a partially obscured mass which on physical exam was palpable as an area of thickening at the 10:00 position 8 cm from the nipple. Ultrasound showed multiple cysts in the upper outer right breast corresponding to the mass in question.  In November 2016 the patient felt a change in the upper left breast and brought it to her primary care physician's attention. Diagnostic left mammography with tomosynthesis and left breast ultrasonography at [facility] December 01/14/2015 found trabecular thickening throughout the left breast with skin thickening, but no circumscribed mass or suspicious calcifications. A rounded mass in the left axilla measured 6 mm. There was an additional mildly prominent lymph node in the left axilla which was what the patient had been palpating. Ultrasonography showed ill-defined swelling throughout the inner left breast with overlying  skin thickening.. At the 2:00 position 4 cm from the nipple there was an irregular hypoechoic mass measuring 2.3 cm. A second mass at the 1:00 area 3 cm from the nipple measured 1 cm. The distance between these 2 masses was 1.8 cm. There was also a round hypoechoic left axillary mass measuring 0.6 cm. Overall the was an area of 3.7 cm of mixed echogenicity corresponding to the area of palpable soft tissue swelling.   On 08/04/2015 the patient underwent biopsy of the mass at the 2:00 axis 4 cm from the nipple and a second biopsy was performed of the hypoechoic mass at the 1:00 position 3 cm from the nipple. Biopsy of the suspicious nodule in the lower left axilla was attempted but could not be completed as the mass became obscured after administration of lidocaine.  The pathology from both left breast biopsies 08/04/2015 ([id number]) showed invasive ductal carcinoma, grade 3. The prognostic profile obtained from the larger tumor showed estrogen receptor to be strongly positive at 95%, progesterone receptor positive at 30%, with an MIB-1 of 12%, and no HER-2 amplification, the signals ratio being 1.22 and the number per cell 2.20.  On 08/09/2015 the patient underwent biopsy of this suspicious left axillary lymph node noted above, and this showed ([id number]) metastatic carcinoma with extracapsular extension.  The patient's subsequent history is as detailed below  INTERVAL HISTORY: Zaynah returns for follow up of her estrogen receptor positive breast cancer, alone. Today is day 7, cycle 1 of 12 planned cycles of weekly paclitaxel. She plans to return tomorrow for cycle 2 of treatment.  REVIEW OF SYSTEMS: Maelyn had few issues with her first cycle of paclitaxel. She had no hypersensitivity reactions during the infusion itself. For the first 2 days after treatment she was noticeably fatigued.  By the start of this week she did not feel this at all. Her nausea was present, but manageable with compazine.  She has noticed less "chemo brain" this week, despite usage of that drug. She is moving her bowels well with miralax. She denies mouths sores. She has no neuropathy symptoms. Her rash is clearing with fluconazole use. She is still not sleeping despite gabapentin, trazodone, benadryl, and lorazepam use nightly. A detailed review of systems is otherwise stable.  PAST MEDICAL HISTORY: Past Medical History  Diagnosis Date  . Hypertension   . Raynaud's disease   . Skin cancer   . Rectovaginal fistula     repaired 2014  . Osteopenia determined by x-ray     PAST SURGICAL HISTORY: Past Surgical History  Procedure Laterality Date  . C-section    . Abdominal hysterectomy      with BSO  . Appendectomy    . Colon resection    . Hernia repair      FAMILY HISTORY Family History  Problem Relation Age of Onset  . Lung cancer Father    the patient's father died from lung cancer at the age of 64 in the setting of tobacco abuse. The patient's mother died at the age of 64 with pancreatic cancer. The patient had no siblings. There is no history of breast or ovarian cancer in the family.  GYNECOLOGIC HISTORY:  No LMP recorded. Patient has had a hysterectomy. Menarche age 55, first live birth age 55. The patient is GX P1. She is status post total hysterectomy with bilateral salpingo-oophorectomy. She has been on hormone replacement since that time and is tapering to off at the time of this dictation.   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 She used to work at [employer]. Her husband [name] works for [employer] division at a [employer] their son [name] is currently at home.    ADVANCED DIRECTIVES: Not in place   HEALTH MAINTENANCE: Social History  Substance Use Topics  . Smoking status: Former Smoker -- 1.00 packs/day    Types: Cigarettes    Quit date: 11/25/2008  . Smokeless tobacco: None  . Alcohol Use: No     Colonoscopy: April 2014/ Raynaldo Opitz at  [facility]  PAP: s/p hysterectomy  Bone density: August 2016/ osteopenia  Lipid panel:  Allergies  Allergen Reactions  . Sulfa Antibiotics Other (See Comments)    Blisters in mouth    Current Outpatient Prescriptions  Medication Sig Dispense Refill  . cetirizine (ZYRTEC) 10 MG tablet Take 10 mg by mouth daily.    . Cholecalciferol (VITAMIN D3) 2000 UNITS capsule Take 2,000 Units by mouth daily.     . clobetasol cream (TEMOVATE) 3.79 % Apply 1 application topically 2 (two) times daily. 30 g 0  . dextroamphetamine (DEXEDRINE SPANSULE) 15 MG 24 hr capsule Take 15 mg by mouth 2 (two) times daily.     Marland Kitchen doxycycline (VIBRA-TABS) 100 MG tablet Take 1 tablet (100 mg total) by mouth daily. 30 tablet 1  . Fenofibric Acid 105 MG TABS Take 1 tablet by mouth daily.    . fluconazole (DIFLUCAN) 100 MG tablet Take 1 tablet (100 mg total) by mouth daily. 30 tablet 0  . fluticasone (FLONASE) 50 MCG/ACT nasal spray Place 2 sprays into both nostrils daily. Reported on 09/08/2015    . gabapentin (NEURONTIN) 300 MG capsule Take 1 capsule (300 mg total) by mouth at bedtime. 90 capsule 4  . Icosapent Ethyl (VASCEPA) 1 G  CAPS Take 2 capsules by mouth 2 (two) times daily.    Marland Kitchen LORazepam (ATIVAN) 0.5 MG tablet Take 1 tablet (0.5 mg total) by mouth at bedtime. 30 tablet 0  . Multiple Vitamin (MULTIVITAMIN) tablet Take 1 tablet by mouth daily.    Marland Kitchen NIFEdipine (PROCARDIA-XL/ADALAT-CC/NIFEDICAL-XL) 30 MG 24 hr tablet Take 30 mg by mouth daily.     Marland Kitchen olmesartan (BENICAR) 20 MG tablet Take 1 tablet (20 mg total) by mouth daily. 90 tablet 4  . omeprazole (PRILOSEC) 40 MG capsule Take 1 capsule (40 mg total) by mouth daily. 30 capsule 2  . polyethylene glycol (MIRALAX / GLYCOLAX) packet Take 17 g by mouth daily. Reported on 10/06/2015    . Probiotic Product (PROBIOTIC DAILY PO) Take 1 capsule by mouth daily.     . prochlorperazine (COMPAZINE) 10 MG tablet Take 1 tablet (10 mg total) by mouth every 6 (six) hours as needed (Nausea  or vomiting). 30 tablet 1  . traZODone (DESYREL) 150 MG tablet Take 150 mg by mouth at bedtime.    Marland Kitchen UNABLE TO FIND Take 2 tablets by mouth at bedtime. Zzz Quil    . valACYclovir (VALTREX) 500 MG tablet Take 1 tablet (500 mg total) by mouth 2 (two) times daily. 30 tablet 1   No current facility-administered medications for this visit.    OBJECTIVE: Middle-aged white woman in no acute distress Filed Vitals:   10/27/15 1133  BP: 107/61  Pulse: 107  Temp: 98.1 F (36.7 C)  Resp: 18     Body mass index is 26.95 kg/(m^2).    ECOG FS:1 - Symptomatic but completely ambulatory  Skin: warm, dry  HEENT: sclerae anicteric, conjunctivae pink, oropharynx clear. No thrush or mucositis.  Lymph Nodes: No cervical or supraclavicular lymphadenopathy  Lungs: clear to auscultation bilaterally, no rales, wheezes, or rhonci  Heart: regular rate and rhythm  Abdomen: round, soft, non tender, positive bowel sounds  Musculoskeletal: No focal spinal tenderness, no peripheral edema  Neuro: non focal, well oriented, positive affect  Breasts: deferred   LAB RESULTS:  CMP     Component Value Date/Time   NA 137 10/27/2015 1108   K 4.4 10/27/2015 1108   CO2 21* 10/27/2015 1108   GLUCOSE 139 10/27/2015 1108   BUN 11.3 10/27/2015 1108   CREATININE 0.9 10/27/2015 1108   CALCIUM 9.5 10/27/2015 1108   PROT 6.4 10/27/2015 1108   ALBUMIN 3.5 10/27/2015 1108   AST 18 10/27/2015 1108   ALT 18 10/27/2015 1108   ALKPHOS 55 10/27/2015 1108   BILITOT <0.30 10/27/2015 1108    INo results found for: SPEP, UPEP  Lab Results  Component Value Date   WBC 5.8 10/27/2015   NEUTROABS 3.7 10/27/2015   HGB 8.5* 10/27/2015   HCT 25.6* 10/27/2015   MCV 92.0 10/27/2015   PLT 375 10/27/2015      Chemistry      Component Value Date/Time   NA 137 10/27/2015 1108   K 4.4 10/27/2015 1108   CO2 21* 10/27/2015 1108   BUN 11.3 10/27/2015 1108   CREATININE 0.9 10/27/2015 1108      Component Value Date/Time    CALCIUM 9.5 10/27/2015 1108   ALKPHOS 55 10/27/2015 1108   AST 18 10/27/2015 1108   ALT 18 10/27/2015 1108   BILITOT <0.30 10/27/2015 1108       No results found for: LABCA2  No components found for: LABCA125  No results for input(s): INR in the last 168 hours.  Urinalysis  No results found for: COLORURINE, APPEARANCEUR, LABSPEC, Waves, GLUCOSEU, HGBUR, BILIRUBINUR, KETONESUR, PROTEINUR, UROBILINOGEN, NITRITE, LEUKOCYTESUR  STUDIES: Mr Breast Bilateral W Wo Contrast  10/19/2015  CLINICAL DATA:  Patient with known left breast carcinoma. Restaging following neoadjuvant chemotherapy. LABS:  Most recent labs obtained on 10/14/2015. Creatinine 0.8. Normal GFR. EXAM: BILATERAL BREAST MRI WITH AND WITHOUT CONTRAST TECHNIQUE: Multiplanar, multisequence MR images of both breasts were obtained prior to and following the intravenous administration of 14 ml of MultiHance. THREE-DIMENSIONAL MR IMAGE RENDERING ON INDEPENDENT WORKSTATION: Three-dimensional MR images were rendered by post-processing of the original MR data on an independent workstation. The three-dimensional MR images were interpreted, and findings are reported in the following complete MRI report for this study. Three dimensional images were evaluated at the independent DynaCad workstation COMPARISON:  Previous mammograms and ultrasounds. Prior breast MRI, 08/16/2015. FINDINGS: Breast composition: c. Heterogeneous fibroglandular tissue. Background parenchymal enhancement: Mild Right breast: There are no areas of abnormal enhancement. Left breast: The previously seen diffuse breast enhancement has significantly improved with neoadjuvant chemotherapy. There is a focal area of enhancement in the upper outer quadrant measuring 10 x 8 x 12 mm. This lies adjacent to the biopsy clip. A smaller, 5 mm, area of enhancement lies posterior to this. There is some generalized hazy non mass enhancement that persists most evident in the medial aspect of the  breast. Lymph nodes: No abnormal appearing lymph nodes. Ancillary findings:  None. IMPRESSION: 1. Positive response to neoadjuvant chemotherapy. There has been a significant reduction in the diffuse abnormal left breast enhancement seen on the prior MRI. There is a focal, 10 x 8 x 12 mm area of enhancement near the biopsy clip in the upper outer left breast. Also some mild, regional, residual non mass enhancement along the medial aspect of the left breast. 2. No evidence of malignancy in the right breast. 3. No adenopathy. RECOMMENDATION: Treatment as planned for the known left breast carcinoma. BI-RADS CATEGORY  6: Known biopsy-proven malignancy. Electronically Signed   By: Lajean Manes M.D.   On: 10/19/2015 15:41    ASSESSMENT: 55 y.o. [place] woman status post 2 separate masses in the left breast 08/04/2015, clinically mT2 N1, stage IIb/IIIa invasive ductal carcinoma, grade 3, the larger mass being estrogen and progesterone receptor positive, HER-2 negative, with an MIB-1 of 12%  (a) biopsy of a left axillary lymph node 08/09/2015 was positive, with extracapsular extension  (1) neoadjuvant chemotherapy started 08/25/2015 consisting of doxorubicin and cyclophosphamide in dose dense fashion 4, completed 10/06/2015,  followed by weekly paclitaxel 12  (2) definitive surgery to follow  (3) adjuvant radiation to follow surgery  (4) anti-estrogens to follow completion of local treatment  (a) bone density August 2016 showed osteopenia  (b) the patient is status post total abdominal hysterectomy with bilateral salpingo-oophorectomy.  (5) the patient appears to be a good candidate for the Alliance trial  PLAN: Willis tolerated her first cycle of paclitaxel as well. The labs were reviewed and she is increasingly anemic, with an hgb of 8.5. She is overtly symptomatic at this time so we will continue to hold off on a blood transfusion. She will call us if she become excessively fatigued, short of  breath, dizzy, or develops palpitations. Tomorrow, she will proceed with cycle 2 of paclitaxel as planned tomorrow.   Her issues with insomnia are long standing. She will try increasing her ativan use to 33m QHS, but I have explained to her that she will become tolerant of this dose as well  with daily use.   I have refilled the fluconazole to clear up the remainder of her chest rash.  Keishla will return in 1 week for cycle 3 of treatment. She understands and agrees with this plan. She knows the goal of treatment in her case is cure. She has been encouraged to call with any issues that might arise before her next visit here.    Laurie Panda, NP   10/27/2015 12:26 PM

## 2015-10-28 ENCOUNTER — Ambulatory Visit (HOSPITAL_BASED_OUTPATIENT_CLINIC_OR_DEPARTMENT_OTHER): Payer: 59

## 2015-10-28 ENCOUNTER — Ambulatory Visit: Payer: 59 | Admitting: Nurse Practitioner

## 2015-10-28 ENCOUNTER — Other Ambulatory Visit: Payer: 59

## 2015-10-28 ENCOUNTER — Encounter: Payer: Self-pay | Admitting: *Deleted

## 2015-10-28 VITALS — BP 108/60 | HR 80 | Temp 98.3°F | Resp 18

## 2015-10-28 DIAGNOSIS — Z5111 Encounter for antineoplastic chemotherapy: Secondary | ICD-10-CM

## 2015-10-28 DIAGNOSIS — C50412 Malignant neoplasm of upper-outer quadrant of left female breast: Secondary | ICD-10-CM | POA: Diagnosis not present

## 2015-10-28 MED ORDER — DIPHENHYDRAMINE HCL 50 MG/ML IJ SOLN
25.0000 mg | Freq: Once | INTRAMUSCULAR | Status: AC
  Administered 2015-10-28: 25 mg via INTRAVENOUS

## 2015-10-28 MED ORDER — SODIUM CHLORIDE 0.9% FLUSH
10.0000 mL | INTRAVENOUS | Status: DC | PRN
  Administered 2015-10-28: 10 mL
  Filled 2015-10-28: qty 10

## 2015-10-28 MED ORDER — SODIUM CHLORIDE 0.9 % IV SOLN
4.0000 mg | Freq: Once | INTRAVENOUS | Status: AC
  Administered 2015-10-28: 4 mg via INTRAVENOUS
  Filled 2015-10-28: qty 0.4

## 2015-10-28 MED ORDER — DIPHENHYDRAMINE HCL 50 MG/ML IJ SOLN
INTRAMUSCULAR | Status: AC
  Filled 2015-10-28: qty 1

## 2015-10-28 MED ORDER — PACLITAXEL CHEMO INJECTION 300 MG/50ML
80.0000 mg/m2 | Freq: Once | INTRAVENOUS | Status: AC
  Administered 2015-10-28: 138 mg via INTRAVENOUS
  Filled 2015-10-28: qty 23

## 2015-10-28 MED ORDER — HEPARIN SOD (PORK) LOCK FLUSH 100 UNIT/ML IV SOLN
500.0000 [IU] | Freq: Once | INTRAVENOUS | Status: AC | PRN
  Administered 2015-10-28: 500 [IU]
  Filled 2015-10-28: qty 5

## 2015-10-28 MED ORDER — FAMOTIDINE IN NACL 20-0.9 MG/50ML-% IV SOLN
20.0000 mg | Freq: Once | INTRAVENOUS | Status: AC
  Administered 2015-10-28: 20 mg via INTRAVENOUS

## 2015-10-28 MED ORDER — FAMOTIDINE IN NACL 20-0.9 MG/50ML-% IV SOLN
INTRAVENOUS | Status: AC
  Filled 2015-10-28: qty 50

## 2015-10-28 MED ORDER — SODIUM CHLORIDE 0.9 % IV SOLN
Freq: Once | INTRAVENOUS | Status: AC
  Administered 2015-10-28: 13:00:00 via INTRAVENOUS

## 2015-10-28 NOTE — Patient Instructions (Signed)
[facility] Discharge Instructions for Patients Receiving Chemotherapy  Today you received the following chemotherapy agents Taxol  To help prevent nausea and vomiting after your treatment, we encourage you to take your nausea medication   If you develop nausea and vomiting that is not controlled by your nausea medication, call the clinic.   BELOW ARE SYMPTOMS THAT SHOULD BE REPORTED IMMEDIATELY:  *FEVER GREATER THAN 100.5 F  *CHILLS WITH OR WITHOUT FEVER  NAUSEA AND VOMITING THAT IS NOT CONTROLLED WITH YOUR NAUSEA MEDICATION  *UNUSUAL SHORTNESS OF BREATH  *UNUSUAL BRUISING OR BLEEDING  TENDERNESS IN MOUTH AND THROAT WITH OR WITHOUT PRESENCE OF ULCERS  *URINARY PROBLEMS  *BOWEL PROBLEMS  UNUSUAL RASH Items with * indicate a potential emergency and should be followed up as soon as possible.  Feel free to call the clinic you have any questions or concerns. The clinic phone number is [phone].  Please show the CHEMO ALERT CARD at check-in to the Emergency Department and triage nurse.   

## 2015-11-04 ENCOUNTER — Ambulatory Visit (HOSPITAL_BASED_OUTPATIENT_CLINIC_OR_DEPARTMENT_OTHER): Payer: 59

## 2015-11-04 ENCOUNTER — Encounter: Payer: Self-pay | Admitting: Nurse Practitioner

## 2015-11-04 ENCOUNTER — Other Ambulatory Visit: Payer: Self-pay | Admitting: *Deleted

## 2015-11-04 ENCOUNTER — Other Ambulatory Visit (HOSPITAL_BASED_OUTPATIENT_CLINIC_OR_DEPARTMENT_OTHER): Payer: 59

## 2015-11-04 ENCOUNTER — Ambulatory Visit (HOSPITAL_BASED_OUTPATIENT_CLINIC_OR_DEPARTMENT_OTHER): Payer: 59 | Admitting: Nurse Practitioner

## 2015-11-04 VITALS — BP 107/57 | HR 102 | Temp 97.9°F | Resp 18 | Ht 63.0 in | Wt 152.2 lb

## 2015-11-04 DIAGNOSIS — Z5111 Encounter for antineoplastic chemotherapy: Secondary | ICD-10-CM | POA: Diagnosis not present

## 2015-11-04 DIAGNOSIS — R110 Nausea: Secondary | ICD-10-CM

## 2015-11-04 DIAGNOSIS — C50412 Malignant neoplasm of upper-outer quadrant of left female breast: Secondary | ICD-10-CM

## 2015-11-04 DIAGNOSIS — R5383 Other fatigue: Secondary | ICD-10-CM

## 2015-11-04 DIAGNOSIS — R42 Dizziness and giddiness: Secondary | ICD-10-CM | POA: Diagnosis not present

## 2015-11-04 LAB — COMPREHENSIVE METABOLIC PANEL
ALBUMIN: 3.7 g/dL (ref 3.5–5.0)
ALK PHOS: 52 U/L (ref 40–150)
ALT: 20 U/L (ref 0–55)
ANION GAP: 10 meq/L (ref 3–11)
AST: 23 U/L (ref 5–34)
BUN: 10.9 mg/dL (ref 7.0–26.0)
CO2: 19 mEq/L — ABNORMAL LOW (ref 22–29)
CREATININE: 0.8 mg/dL (ref 0.6–1.1)
Calcium: 9.2 mg/dL (ref 8.4–10.4)
Chloride: 107 mEq/L (ref 98–109)
EGFR: 80 mL/min/{1.73_m2} — AB (ref >90)
GLUCOSE: 166 mg/dL — AB (ref 70–140)
Potassium: 4.3 mEq/L (ref 3.5–5.1)
Sodium: 137 mEq/L (ref 136–145)
TOTAL PROTEIN: 6.6 g/dL (ref 6.4–8.3)

## 2015-11-04 LAB — CBC WITH DIFFERENTIAL/PLATELET
BASO%: 2.8 % — ABNORMAL HIGH (ref 0.0–2.0)
Basophils Absolute: 0.2 10*3/uL — ABNORMAL HIGH (ref 0.0–0.1)
EOS ABS: 0.2 10*3/uL (ref 0.0–0.5)
EOS%: 3.3 % (ref 0.0–7.0)
HCT: 26.8 % — ABNORMAL LOW (ref 34.8–46.6)
HEMOGLOBIN: 8.7 g/dL — AB (ref 11.6–15.9)
LYMPH#: 1.2 10*3/uL (ref 0.9–3.3)
LYMPH%: 21.6 % (ref 14.0–49.7)
MCH: 31.3 pg (ref 25.1–34.0)
MCHC: 32.5 g/dL (ref 31.5–36.0)
MCV: 96.4 fL (ref 79.5–101.0)
MONO#: 0.4 10*3/uL (ref 0.1–0.9)
MONO%: 7.7 % (ref 0.0–14.0)
NEUT%: 64.6 % (ref 38.4–76.8)
NEUTROS ABS: 3.5 10*3/uL (ref 1.5–6.5)
PLATELETS: 324 10*3/uL (ref 145–400)
RBC: 2.78 10*6/uL — ABNORMAL LOW (ref 3.70–5.45)
RDW: 19.3 % — AB (ref 11.2–14.5)
WBC: 5.4 10*3/uL (ref 3.9–10.3)

## 2015-11-04 MED ORDER — DIPHENHYDRAMINE HCL 50 MG/ML IJ SOLN
INTRAMUSCULAR | Status: AC
  Filled 2015-11-04: qty 1

## 2015-11-04 MED ORDER — HEPARIN SOD (PORK) LOCK FLUSH 100 UNIT/ML IV SOLN
500.0000 [IU] | Freq: Once | INTRAVENOUS | Status: AC | PRN
  Administered 2015-11-04: 500 [IU]
  Filled 2015-11-04: qty 5

## 2015-11-04 MED ORDER — FAMOTIDINE IN NACL 20-0.9 MG/50ML-% IV SOLN
20.0000 mg | Freq: Once | INTRAVENOUS | Status: AC
  Administered 2015-11-04: 20 mg via INTRAVENOUS

## 2015-11-04 MED ORDER — SODIUM CHLORIDE 0.9 % IV SOLN
4.0000 mg | Freq: Once | INTRAVENOUS | Status: AC
  Administered 2015-11-04: 4 mg via INTRAVENOUS
  Filled 2015-11-04: qty 0.4

## 2015-11-04 MED ORDER — FAMOTIDINE IN NACL 20-0.9 MG/50ML-% IV SOLN
INTRAVENOUS | Status: AC
  Filled 2015-11-04: qty 50

## 2015-11-04 MED ORDER — ONDANSETRON HCL 8 MG PO TABS: 8.0000 mg | ORAL_TABLET | Freq: Three times a day (TID) | ORAL | Status: DC | PRN

## 2015-11-04 MED ORDER — SODIUM CHLORIDE 0.9 % IV SOLN
Freq: Once | INTRAVENOUS | Status: AC
  Administered 2015-11-04: 15:00:00 via INTRAVENOUS

## 2015-11-04 MED ORDER — PACLITAXEL CHEMO INJECTION 300 MG/50ML
80.0000 mg/m2 | Freq: Once | INTRAVENOUS | Status: AC
  Administered 2015-11-04: 138 mg via INTRAVENOUS
  Filled 2015-11-04: qty 23

## 2015-11-04 MED ORDER — SODIUM CHLORIDE 0.9% FLUSH
10.0000 mL | INTRAVENOUS | Status: DC | PRN
  Administered 2015-11-04: 10 mL
  Filled 2015-11-04: qty 10

## 2015-11-04 MED ORDER — DIPHENHYDRAMINE HCL 50 MG/ML IJ SOLN
25.0000 mg | Freq: Once | INTRAMUSCULAR | Status: AC
  Administered 2015-11-04: 25 mg via INTRAVENOUS

## 2015-11-04 NOTE — Progress Notes (Signed)
Alison [name]  [phone] [phone]) [phone]   ID: Alison [name] DOB: 1960-11-11  MR#: [id number]  JYN#:[id number]  Patient Care Team: [name], MD as PCP - General (Internal Medicine) Erroll Luna, MD as Consulting Physician (General Surgery) Chauncey Cruel, MD as Consulting Physician (Oncology) [name], MD as Consulting Physician (Radiation Oncology) Aloha Gell, MD as Consulting Physician (Obstetrics and Gynecology) Harriett Sine, MD as Consulting Physician (Dermatology) PCP: [name], MD OTHER MD:  CHIEF COMPLAINT: Estrogen receptor positive breast cancer  CURRENT TREATMENT: Neoadjuvant chemotherapy  BREAST CANCER HISTORY: From the original intake note:  Talley had routine screening mammography with tomography [facility] February 20 11/14/2014 showing a possible mass in the right breast. Diagnostic right mammography with right breast ultrasonography 11/01/2014 found the breast density to be category C. In the upper outer quadrant of the right breast there was a partially obscured mass which on physical exam was palpable as an area of thickening at the 10:00 position 8 cm from the nipple. Ultrasound showed multiple cysts in the upper outer right breast corresponding to the mass in question.  In November 2016 the patient felt a change in the upper left breast and brought it to her primary care physician's attention. Diagnostic left mammography with tomosynthesis and left breast ultrasonography at [facility] December 01/14/2015 found trabecular thickening throughout the left breast with skin thickening, but no circumscribed mass or suspicious calcifications. A rounded mass in the left axilla measured 6 mm. There was an additional mildly prominent lymph node in the left axilla which was what the patient had been palpating. Ultrasonography showed ill-defined swelling throughout the inner left breast with overlying  skin thickening.. At the 2:00 position 4 cm from the nipple there was an irregular hypoechoic mass measuring 2.3 cm. A second mass at the 1:00 area 3 cm from the nipple measured 1 cm. The distance between these 2 masses was 1.8 cm. There was also a round hypoechoic left axillary mass measuring 0.6 cm. Overall the was an area of 3.7 cm of mixed echogenicity corresponding to the area of palpable soft tissue swelling.   On 08/04/2015 the patient underwent biopsy of the mass at the 2:00 axis 4 cm from the nipple and a second biopsy was performed of the hypoechoic mass at the 1:00 position 3 cm from the nipple. Biopsy of the suspicious nodule in the lower left axilla was attempted but could not be completed as the mass became obscured after administration of lidocaine.  The pathology from both left breast biopsies 08/04/2015 ([id number]) showed invasive ductal carcinoma, grade 3. The prognostic profile obtained from the larger tumor showed estrogen receptor to be strongly positive at 95%, progesterone receptor positive at 30%, with an MIB-1 of 12%, and no HER-2 amplification, the signals ratio being 1.22 and the number per cell 2.20.  On 08/09/2015 the patient underwent biopsy of this suspicious left axillary lymph node noted above, and this showed ([id number]) metastatic carcinoma with extracapsular extension.  The patient's subsequent history is as detailed below  INTERVAL HISTORY: Alison [name] returns for follow up of her estrogen receptor positive breast cancer, accompanied by her husband dave. Today is day 1, cycle 3 of 12 planned cycles of weekly paclitaxel.  REVIEW OF SYSTEMS: Nastassia is feeling sluggish today. She has had more fatigued and increase incidences of dizziness. This is less prominent when she keeps up with her fluids. She denies fevers or chills. Her nausea is well managed  on compazine PRN. She is using miralax to stay regular. The rash to her chest has cleared up. She denies mouth sores.  Her appetite is fair. She has no neuropathy symptoms. A detailed review of systems is otherwise stable.  PAST MEDICAL HISTORY: Past Medical History  Diagnosis Date  . Hypertension   . Raynaud's disease   . Skin cancer   . Rectovaginal fistula     repaired 2014  . Osteopenia determined by x-ray     PAST SURGICAL HISTORY: Past Surgical History  Procedure Laterality Date  . C-section    . Abdominal hysterectomy      with BSO  . Appendectomy    . Colon resection    . Hernia repair      FAMILY HISTORY Family History  Problem Relation Age of Onset  . Lung cancer Father    the patient's father died from lung cancer at the age of 21 in the setting of tobacco abuse. The patient's mother died at the age of 39 with pancreatic cancer. The patient had no siblings. There is no history of breast or ovarian cancer in the family.  GYNECOLOGIC HISTORY:  No LMP recorded. Patient has had a hysterectomy. Menarche age 55, first live birth age 55. The patient is GX P1. She is status post total hysterectomy with bilateral salpingo-oophorectomy. She has been on hormone replacement since that time and is tapering to off at the time of this dictation.   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 She used to work at [employer]. Her husband Alison [name] works for [employer] division at a [employer] their son Alison [name] is currently at home.    ADVANCED DIRECTIVES: Not in place   HEALTH MAINTENANCE: Social History  Substance Use Topics  . Smoking status: Former Smoker -- 1.00 packs/day    Types: Cigarettes    Quit date: 11/25/2008  . Smokeless tobacco: Not on file  . Alcohol Use: No     Colonoscopy: April 2014/ Raynaldo Opitz at [facility]  PAP: s/p hysterectomy  Bone density: August 2016/ osteopenia  Lipid panel:  Allergies  Allergen Reactions  . Sulfa Antibiotics Other (See Comments)    Blisters in mouth    Current Outpatient Prescriptions  Medication Sig Dispense Refill    . cetirizine (ZYRTEC) 10 MG tablet Take 10 mg by mouth daily.    . Cholecalciferol (VITAMIN D3) 2000 UNITS capsule Take 2,000 Units by mouth daily.     Marland Kitchen dextroamphetamine (DEXEDRINE SPANSULE) 15 MG 24 hr capsule Take 15 mg by mouth 2 (two) times daily.     Marland Kitchen doxycycline (VIBRA-TABS) 100 MG tablet Take 1 tablet (100 mg total) by mouth daily. 30 tablet 1  . Fenofibric Acid 105 MG TABS Take 1 tablet by mouth daily.    . fluconazole (DIFLUCAN) 100 MG tablet Take 1 tablet (100 mg total) by mouth daily. 30 tablet 0  . fluticasone (FLONASE) 50 MCG/ACT nasal spray Place 2 sprays into both nostrils daily. Reported on 09/08/2015    . gabapentin (NEURONTIN) 300 MG capsule Take 1 capsule (300 mg total) by mouth at bedtime. 90 capsule 4  . Icosapent Ethyl (VASCEPA) 1 G CAPS Take 2 capsules by mouth 2 (two) times daily.    Marland Kitchen LORazepam (ATIVAN) 0.5 MG tablet Take 1 tablet (0.5 mg total) by mouth at bedtime. 30 tablet 0  . Multiple Vitamin (MULTIVITAMIN) tablet Take 1 tablet by mouth daily.    Marland Kitchen NIFEdipine (PROCARDIA-XL/ADALAT-CC/NIFEDICAL-XL) 30 MG 24 hr tablet  Take 30 mg by mouth daily.     Marland Kitchen olmesartan (BENICAR) 20 MG tablet Take 1 tablet (20 mg total) by mouth daily. 90 tablet 4  . omeprazole (PRILOSEC) 40 MG capsule Take 1 capsule (40 mg total) by mouth daily. 30 capsule 2  . polyethylene glycol (MIRALAX / GLYCOLAX) packet Take 17 g by mouth daily. Reported on 10/06/2015    . Probiotic Product (PROBIOTIC DAILY PO) Take 1 capsule by mouth daily.     . prochlorperazine (COMPAZINE) 10 MG tablet Take 1 tablet (10 mg total) by mouth every 6 (six) hours as needed (Nausea or vomiting). 30 tablet 1  . traZODone (DESYREL) 150 MG tablet Take 150 mg by mouth at bedtime.    Marland Kitchen UNABLE TO FIND Take 2 tablets by mouth at bedtime. Reported on 11/04/2015    . valACYclovir (VALTREX) 500 MG tablet Take 1 tablet (500 mg total) by mouth 2 (two) times daily. 30 tablet 1  . clobetasol cream (TEMOVATE) 3.54 % Apply 1 application  topically 2 (two) times daily. (Patient not taking: Reported on 11/04/2015) 30 g 0  . ondansetron (ZOFRAN) 8 MG tablet Take 1 tablet (8 mg total) by mouth every 8 (eight) hours as needed for nausea or vomiting. 30 tablet 0   No current facility-administered medications for this visit.   Facility-Administered Medications Ordered in Other Visits  Medication Dose Route Frequency Provider Last Rate Last Dose  . dexamethasone (DECADRON) 4 mg in sodium chloride 0.9 % 50 mL IVPB  4 mg Intravenous Once Chauncey Cruel, MD      . famotidine (PEPCID) IVPB 20 mg premix  20 mg Intravenous Once Chauncey Cruel, MD   20 mg at 11/04/15 1442  . heparin lock flush 100 unit/mL  500 Units Intracatheter Once PRN Chauncey Cruel, MD      . PACLitaxel (TAXOL) 138 mg in dextrose 5 % 250 mL chemo infusion (</= 38m/m2)  80 mg/m2 (Treatment Plan Actual) Intravenous Once GChauncey Cruel MD      . sodium chloride flush (NS) 0.9 % injection 10 mL  10 mL Intracatheter PRN GChauncey Cruel MD        OBJECTIVE: Middle-aged white woman in no acute distress Filed Vitals:   11/04/15 1306  BP: 107/57  Pulse: 102  Temp: 97.9 F (36.6 C)  Resp: 18     Body mass index is 26.97 kg/(m^2).    ECOG FS:1 - Symptomatic but completely ambulatory   Sclerae unicteric, pupils round and equal Oropharynx clear and moist-- no thrush or other lesions No cervical or supraclavicular adenopathy Lungs no rales or rhonchi Heart regular rate and rhythm Abd soft, nontender, positive bowel sounds MSK no focal spinal tenderness, no upper extremity lymphedema Neuro: nonfocal, well oriented, appropriate affect Breasts: deferred   LAB RESULTS:  CMP     Component Value Date/Time   NA 137 11/04/2015 1305   K 4.3 11/04/2015 1305   CO2 19* 11/04/2015 1305   GLUCOSE 166* 11/04/2015 1305   BUN 10.9 11/04/2015 1305   CREATININE 0.8 11/04/2015 1305   CALCIUM 9.2 11/04/2015 1305   PROT 6.6 11/04/2015 1305   ALBUMIN 3.7 11/04/2015  1305   AST 23 11/04/2015 1305   ALT 20 11/04/2015 1305   ALKPHOS 52 11/04/2015 1305   BILITOT <0.30 11/04/2015 1305    INo results found for: SPEP, UPEP  Lab Results  Component Value Date   WBC 5.4 11/04/2015   NEUTROABS 3.5 11/04/2015   HGB  8.7* 11/04/2015   HCT 26.8* 11/04/2015   MCV 96.4 11/04/2015   PLT 324 11/04/2015      Chemistry      Component Value Date/Time   NA 137 11/04/2015 1305   K 4.3 11/04/2015 1305   CO2 19* 11/04/2015 1305   BUN 10.9 11/04/2015 1305   CREATININE 0.8 11/04/2015 1305      Component Value Date/Time   CALCIUM 9.2 11/04/2015 1305   ALKPHOS 52 11/04/2015 1305   AST 23 11/04/2015 1305   ALT 20 11/04/2015 1305   BILITOT <0.30 11/04/2015 1305       No results found for: LABCA2  No components found for: LABCA125  No results for input(s): INR in the last 168 hours.  Urinalysis No results found for: COLORURINE, APPEARANCEUR, LABSPEC, PHURINE, GLUCOSEU, HGBUR, BILIRUBINUR, KETONESUR, PROTEINUR, UROBILINOGEN, NITRITE, LEUKOCYTESUR  STUDIES: Mr Breast Bilateral W Wo Contrast  10/19/2015  CLINICAL DATA:  Patient with known left breast carcinoma. Restaging following neoadjuvant chemotherapy. LABS:  Most recent labs obtained on 10/14/2015. Creatinine 0.8. Normal GFR. EXAM: BILATERAL BREAST MRI WITH AND WITHOUT CONTRAST TECHNIQUE: Multiplanar, multisequence MR images of both breasts were obtained prior to and following the intravenous administration of 14 ml of MultiHance. THREE-DIMENSIONAL MR IMAGE RENDERING ON INDEPENDENT WORKSTATION: Three-dimensional MR images were rendered by post-processing of the original MR data on an independent workstation. The three-dimensional MR images were interpreted, and findings are reported in the following complete MRI report for this study. Three dimensional images were evaluated at the independent DynaCad workstation COMPARISON:  Previous mammograms and ultrasounds. Prior breast MRI, 08/16/2015. FINDINGS: Breast  composition: c. Heterogeneous fibroglandular tissue. Background parenchymal enhancement: Mild Right breast: There are no areas of abnormal enhancement. Left breast: The previously seen diffuse breast enhancement has significantly improved with neoadjuvant chemotherapy. There is a focal area of enhancement in the upper outer quadrant measuring 10 x 8 x 12 mm. This lies adjacent to the biopsy clip. A smaller, 5 mm, area of enhancement lies posterior to this. There is some generalized hazy non mass enhancement that persists most evident in the medial aspect of the breast. Lymph nodes: No abnormal appearing lymph nodes. Ancillary findings:  None. IMPRESSION: 1. Positive response to neoadjuvant chemotherapy. There has been a significant reduction in the diffuse abnormal left breast enhancement seen on the prior MRI. There is a focal, 10 x 8 x 12 mm area of enhancement near the biopsy clip in the upper outer left breast. Also some mild, regional, residual non mass enhancement along the medial aspect of the left breast. 2. No evidence of malignancy in the right breast. 3. No adenopathy. RECOMMENDATION: Treatment as planned for the known left breast carcinoma. BI-RADS CATEGORY  6: Known biopsy-proven malignancy. Electronically Signed   By: Lajean Manes M.D.   On: 10/19/2015 15:41    ASSESSMENT: 55 y.o. [place] woman status post 2 separate masses in the left breast 08/04/2015, clinically mT2 N1, stage IIb/IIIa invasive ductal carcinoma, grade 3, the larger mass being estrogen and progesterone receptor positive, HER-2 negative, with an MIB-1 of 12%  (a) biopsy of a left axillary lymph node 08/09/2015 was positive, with extracapsular extension  (1) neoadjuvant chemotherapy started 08/25/2015 consisting of doxorubicin and cyclophosphamide in dose dense fashion 4, completed 10/06/2015,  followed by weekly paclitaxel 12  (2) definitive surgery to follow  (3) adjuvant radiation to follow surgery  (4)  anti-estrogens to follow completion of local treatment  (a) bone density August 2016 showed osteopenia  (b) the patient  is status post total abdominal hysterectomy with bilateral salpingo-oophorectomy.  (5) the patient appears to be a good candidate for the Alliance trial  PLAN: Amarra is more fatigued than usual, and occasionally dizzy. Her hgb is down to 8.7, which is not the lowest she has been, but a likely contributor. I offered her 1 unit of blood to help with this, and for now she declines. She would prefer to increase her water intake. Additionally, she has been taking compazine every 6 hours which lists these same issues as side effects. We are calling a refill of zofran into her pharmacy. The rest of the labs were stable as well as her vital signs. She will proceed with cycle 3 of paclitaxel as planned today.   She will call us with any increase in nausea, dizziness, shortness of breath, palpitations, or if she changes her mind about the unit of blood.   Megen will return in 1 week for cycle 4 of treatment. She understands and agrees with this plan. She knows the goal of treatment in her case is cure.   Laurie Panda, NP   11/04/2015 2:51 PM

## 2015-11-04 NOTE — Patient Instructions (Signed)
[facility] Discharge Instructions for Patients Receiving Chemotherapy  Today you received the following chemotherapy agent: Taxol   To help prevent nausea and vomiting after your treatment, we encourage you to take your nausea medication as prescribed.    If you develop nausea and vomiting that is not controlled by your nausea medication, call the clinic.   BELOW ARE SYMPTOMS THAT SHOULD BE REPORTED IMMEDIATELY:  *FEVER GREATER THAN 100.5 F  *CHILLS WITH OR WITHOUT FEVER  NAUSEA AND VOMITING THAT IS NOT CONTROLLED WITH YOUR NAUSEA MEDICATION  *UNUSUAL SHORTNESS OF BREATH  *UNUSUAL BRUISING OR BLEEDING  TENDERNESS IN MOUTH AND THROAT WITH OR WITHOUT PRESENCE OF ULCERS  *URINARY PROBLEMS  *BOWEL PROBLEMS  UNUSUAL RASH Items with * indicate a potential emergency and should be followed up as soon as possible.  Feel free to call the clinic you have any questions or concerns. The clinic phone number is [phone].  Please show the CHEMO ALERT CARD at check-in to the Emergency Department and triage nurse.   

## 2015-11-07 ENCOUNTER — Other Ambulatory Visit: Payer: Self-pay | Admitting: *Deleted

## 2015-11-07 DIAGNOSIS — R110 Nausea: Secondary | ICD-10-CM

## 2015-11-07 MED ORDER — ONDANSETRON HCL 8 MG PO TABS: 8.0000 mg | ORAL_TABLET | Freq: Three times a day (TID) | ORAL | Status: DC | PRN

## 2015-11-09 ENCOUNTER — Telehealth: Payer: Self-pay | Admitting: *Deleted

## 2015-11-09 ENCOUNTER — Telehealth: Payer: Self-pay

## 2015-11-09 MED ORDER — ESZOPICLONE 2 MG PO TABS: 2.0000 mg | ORAL_TABLET | Freq: Every day | ORAL | Status: DC

## 2015-11-09 NOTE — Telephone Encounter (Signed)
Patient calling today for a medication request- ambien.  She states that she is using trazadone and has tried several sleep medications that do not aide in her sleep.  She would really like to try ambien and would like MD's nurse to return her call to discuss.  VM sent to [facility], Therapist, sports.

## 2015-11-09 NOTE — Telephone Encounter (Signed)
Pt called requesting something for sleep due to ongoing insomnia.  Note pt has known history of insomnia -  Ambien was beneficial for sleep " but I would wake up at night and eat "  She has tried z quil, melatonin, ativan, trazodone, and gabapentin- all with minimal relief.  Bety is inquiring " can I try the Ambien again ?"  This RN verified above " eating episodes " were post use with pt having no memory of events occuring. Informed her due to concern that she could have more episodes with use including more dangerous behavior if we were to prescribe Ambien.  Above reviewed with HB/NP and obtained a prescription for lunesta.  This RN discussed use with pt .

## 2015-11-11 ENCOUNTER — Ambulatory Visit (HOSPITAL_BASED_OUTPATIENT_CLINIC_OR_DEPARTMENT_OTHER): Payer: 59

## 2015-11-11 ENCOUNTER — Encounter: Payer: Self-pay | Admitting: *Deleted

## 2015-11-11 ENCOUNTER — Encounter: Payer: Self-pay | Admitting: Nurse Practitioner

## 2015-11-11 ENCOUNTER — Other Ambulatory Visit: Payer: Self-pay | Admitting: *Deleted

## 2015-11-11 ENCOUNTER — Telehealth: Payer: Self-pay | Admitting: Nurse Practitioner

## 2015-11-11 ENCOUNTER — Other Ambulatory Visit (HOSPITAL_BASED_OUTPATIENT_CLINIC_OR_DEPARTMENT_OTHER): Payer: 59

## 2015-11-11 ENCOUNTER — Ambulatory Visit (HOSPITAL_BASED_OUTPATIENT_CLINIC_OR_DEPARTMENT_OTHER): Payer: 59 | Admitting: Nurse Practitioner

## 2015-11-11 VITALS — BP 102/58 | HR 101 | Temp 98.6°F | Resp 18 | Ht 63.0 in | Wt 154.0 lb

## 2015-11-11 DIAGNOSIS — M8580 Other specified disorders of bone density and structure, unspecified site: Secondary | ICD-10-CM

## 2015-11-11 DIAGNOSIS — G4700 Insomnia, unspecified: Secondary | ICD-10-CM | POA: Diagnosis not present

## 2015-11-11 DIAGNOSIS — C773 Secondary and unspecified malignant neoplasm of axilla and upper limb lymph nodes: Secondary | ICD-10-CM | POA: Diagnosis not present

## 2015-11-11 DIAGNOSIS — Z170 Estrogen receptor positive status [ER+]: Secondary | ICD-10-CM | POA: Diagnosis not present

## 2015-11-11 DIAGNOSIS — C50412 Malignant neoplasm of upper-outer quadrant of left female breast: Secondary | ICD-10-CM

## 2015-11-11 DIAGNOSIS — Z5111 Encounter for antineoplastic chemotherapy: Secondary | ICD-10-CM | POA: Diagnosis not present

## 2015-11-11 LAB — CBC WITH DIFFERENTIAL/PLATELET
BASO%: 2.7 % — ABNORMAL HIGH (ref 0.0–2.0)
BASOS ABS: 0.2 10*3/uL — AB (ref 0.0–0.1)
EOS%: 2.1 % (ref 0.0–7.0)
Eosinophils Absolute: 0.1 10*3/uL (ref 0.0–0.5)
HEMATOCRIT: 29.3 % — AB (ref 34.8–46.6)
HEMOGLOBIN: 10 g/dL — AB (ref 11.6–15.9)
LYMPH#: 1.3 10*3/uL (ref 0.9–3.3)
LYMPH%: 20.2 % (ref 14.0–49.7)
MCH: 33.5 pg (ref 25.1–34.0)
MCHC: 34 g/dL (ref 31.5–36.0)
MCV: 98.7 fL (ref 79.5–101.0)
MONO#: 0.6 10*3/uL (ref 0.1–0.9)
MONO%: 8.6 % (ref 0.0–14.0)
NEUT#: 4.3 10*3/uL (ref 1.5–6.5)
NEUT%: 66.4 % (ref 38.4–76.8)
PLATELETS: 422 10*3/uL — AB (ref 145–400)
RBC: 2.97 10*6/uL — ABNORMAL LOW (ref 3.70–5.45)
RDW: 21.2 % — AB (ref 11.2–14.5)
WBC: 6.5 10*3/uL (ref 3.9–10.3)

## 2015-11-11 LAB — COMPREHENSIVE METABOLIC PANEL
ALBUMIN: 3.9 g/dL (ref 3.5–5.0)
ALK PHOS: 47 U/L (ref 40–150)
ALT: 18 U/L (ref 0–55)
AST: 20 U/L (ref 5–34)
Anion Gap: 8 mEq/L (ref 3–11)
BUN: 11.7 mg/dL (ref 7.0–26.0)
CALCIUM: 9.4 mg/dL (ref 8.4–10.4)
CO2: 24 mEq/L (ref 22–29)
CREATININE: 0.8 mg/dL (ref 0.6–1.1)
Chloride: 109 mEq/L (ref 98–109)
EGFR: 78 mL/min/{1.73_m2} — ABNORMAL LOW (ref >90)
Glucose: 122 mg/dl (ref 70–140)
POTASSIUM: 4 meq/L (ref 3.5–5.1)
Sodium: 140 mEq/L (ref 136–145)
Total Bilirubin: 0.3 mg/dL (ref 0.20–1.20)
Total Protein: 7.3 g/dL (ref 6.4–8.3)

## 2015-11-11 MED ORDER — VALACYCLOVIR HCL 500 MG PO TABS: 500.0000 mg | ORAL_TABLET | Freq: Two times a day (BID) | ORAL | Status: DC

## 2015-11-11 MED ORDER — SODIUM CHLORIDE 0.9 % IV SOLN
Freq: Once | INTRAVENOUS | Status: AC
  Administered 2015-11-11: 14:00:00 via INTRAVENOUS

## 2015-11-11 MED ORDER — FAMOTIDINE IN NACL 20-0.9 MG/50ML-% IV SOLN
20.0000 mg | Freq: Once | INTRAVENOUS | Status: AC
  Administered 2015-11-11: 20 mg via INTRAVENOUS

## 2015-11-11 MED ORDER — HEPARIN SOD (PORK) LOCK FLUSH 100 UNIT/ML IV SOLN
500.0000 [IU] | Freq: Once | INTRAVENOUS | Status: AC | PRN
  Administered 2015-11-11: 500 [IU]
  Filled 2015-11-11: qty 5

## 2015-11-11 MED ORDER — PACLITAXEL CHEMO INJECTION 300 MG/50ML
80.0000 mg/m2 | Freq: Once | INTRAVENOUS | Status: AC
  Administered 2015-11-11: 138 mg via INTRAVENOUS
  Filled 2015-11-11: qty 23

## 2015-11-11 MED ORDER — FAMOTIDINE IN NACL 20-0.9 MG/50ML-% IV SOLN
INTRAVENOUS | Status: AC
  Filled 2015-11-11: qty 50

## 2015-11-11 MED ORDER — DIPHENHYDRAMINE HCL 50 MG/ML IJ SOLN
25.0000 mg | Freq: Once | INTRAMUSCULAR | Status: AC
  Administered 2015-11-11: 25 mg via INTRAVENOUS

## 2015-11-11 MED ORDER — DIPHENHYDRAMINE HCL 50 MG/ML IJ SOLN
INTRAMUSCULAR | Status: AC
  Filled 2015-11-11: qty 1

## 2015-11-11 MED ORDER — SODIUM CHLORIDE 0.9% FLUSH
10.0000 mL | INTRAVENOUS | Status: DC | PRN
  Administered 2015-11-11: 10 mL
  Filled 2015-11-11: qty 10

## 2015-11-11 MED ORDER — DEXAMETHASONE SODIUM PHOSPHATE 100 MG/10ML IJ SOLN
4.0000 mg | Freq: Once | INTRAMUSCULAR | Status: AC
  Administered 2015-11-11: 4 mg via INTRAVENOUS
  Filled 2015-11-11: qty 0.4

## 2015-11-11 NOTE — Patient Instructions (Signed)
[facility] Discharge Instructions for Patients Receiving Chemotherapy  Today you received the following chemotherapy agents:  Taxol  To help prevent nausea and vomiting after your treatment, we encourage you to take your nausea medication as prescribed.   If you develop nausea and vomiting that is not controlled by your nausea medication, call the clinic.   BELOW ARE SYMPTOMS THAT SHOULD BE REPORTED IMMEDIATELY:  *FEVER GREATER THAN 100.5 F  *CHILLS WITH OR WITHOUT FEVER  NAUSEA AND VOMITING THAT IS NOT CONTROLLED WITH YOUR NAUSEA MEDICATION  *UNUSUAL SHORTNESS OF BREATH  *UNUSUAL BRUISING OR BLEEDING  TENDERNESS IN MOUTH AND THROAT WITH OR WITHOUT PRESENCE OF ULCERS  *URINARY PROBLEMS  *BOWEL PROBLEMS  UNUSUAL RASH Items with * indicate a potential emergency and should be followed up as soon as possible.  Feel free to call the clinic you have any questions or concerns. The clinic phone number is [phone].  Please show the CHEMO ALERT CARD at check-in to the Emergency Department and triage nurse.   

## 2015-11-11 NOTE — Progress Notes (Signed)
[place]  [phone] [phone]   ID: Alison [name] DOB: 26-Aug-1961  MR#: [id number]  OQH#:[id number]  Patient Care Team: [name], MD as PCP - General (Internal Medicine) Erroll Luna, MD as Consulting Physician (General Surgery) Chauncey Cruel, MD as Consulting Physician (Oncology) [name], MD as Consulting Physician (Radiation Oncology) Aloha Gell, MD as Consulting Physician (Obstetrics and Gynecology) Harriett Sine, MD as Consulting Physician (Dermatology) PCP: [name], MD OTHER MD:  CHIEF COMPLAINT: Estrogen receptor positive breast cancer  CURRENT TREATMENT: Neoadjuvant chemotherapy  BREAST CANCER HISTORY: From the original intake note:  Alison [name] had routine screening mammography with tomography [facility] February 20 11/14/2014 showing a possible mass in the right breast. Diagnostic right mammography with right breast ultrasonography 11/01/2014 found the breast density to be category C. In the upper outer quadrant of the right breast there was a partially obscured mass which on physical exam was palpable as an area of thickening at the 10:00 position 8 cm from the nipple. Ultrasound showed multiple cysts in the upper outer right breast corresponding to the mass in question.  In November 2016 the patient felt a change in the upper left breast and brought it to her primary care physician's attention. Diagnostic left mammography with tomosynthesis and left breast ultrasonography at [facility] December 01/14/2015 found trabecular thickening throughout the left breast with skin thickening, but no circumscribed mass or suspicious calcifications. A rounded mass in the left axilla measured 6 mm. There was an additional mildly prominent lymph node in the left axilla which was what the patient had been palpating. Ultrasonography showed ill-defined swelling throughout the inner left breast with overlying  skin thickening.. At the 2:00 position 4 cm from the nipple there was an irregular hypoechoic mass measuring 2.3 cm. A second mass at the 1:00 area 3 cm from the nipple measured 1 cm. The distance between these 2 masses was 1.8 cm. There was also a round hypoechoic left axillary mass measuring 0.6 cm. Overall the was an area of 3.7 cm of mixed echogenicity corresponding to the area of palpable soft tissue swelling.   On 08/04/2015 the patient underwent biopsy of the mass at the 2:00 axis 4 cm from the nipple and a second biopsy was performed of the hypoechoic mass at the 1:00 position 3 cm from the nipple. Biopsy of the suspicious nodule in the lower left axilla was attempted but could not be completed as the mass became obscured after administration of lidocaine.  The pathology from both left breast biopsies 08/04/2015 ([id number]) showed invasive ductal carcinoma, grade 3. The prognostic profile obtained from the larger tumor showed estrogen receptor to be strongly positive at 95%, progesterone receptor positive at 30%, with an MIB-1 of 12%, and no HER-2 amplification, the signals ratio being 1.22 and the number per cell 2.20.  On 08/09/2015 the patient underwent biopsy of this suspicious left axillary lymph node noted above, and this showed ([id number]) metastatic carcinoma with extracapsular extension.  The patient's subsequent history is as detailed below  INTERVAL HISTORY: [name] returns for follow up of her estrogen receptor positive breast cancer, accompanied by her husband Alison [name]. Today is day 1, cycle 4 of 12 planned cycles of weekly paclitaxel.  REVIEW OF SYSTEMS: Alison [name] had continue to struggle with insomnia despite the use of several drugs. She called the triage line and was given a prescription for lunesta. For the past 2 days she has gotten at least 8  hours of sleep. As a result her overall energy level is up. She denies fevers, chills, nausea or vomiting. Miralax daily keeps her  regular. She is eating well and gaining more weight, despite taste changes which is making drinking as much water a challenge. She denies mouth sores, rashes, or neuropathy symptoms. A detailed review of systems is otherwise stable.  PAST MEDICAL HISTORY: Past Medical History  Diagnosis Date  . Hypertension   . Raynaud's disease   . Skin cancer   . Rectovaginal fistula     repaired 2014  . Osteopenia determined by x-ray     PAST SURGICAL HISTORY: Past Surgical History  Procedure Laterality Date  . C-section    . Abdominal hysterectomy      with BSO  . Appendectomy    . Colon resection    . Hernia repair      FAMILY HISTORY Family History  Problem Relation Age of Onset  . Lung cancer Father    the patient's father died from lung cancer at the age of 4 in the setting of tobacco abuse. The patient's mother died at the age of 56 with pancreatic cancer. The patient had no siblings. There is no history of breast or ovarian cancer in the family.  GYNECOLOGIC HISTORY:  No LMP recorded. Patient has had a hysterectomy. Menarche age 27, first live birth age 53. The patient is GX P1. She is status post total hysterectomy with bilateral salpingo-oophorectomy. She has been on hormone replacement since that time and is tapering to off at the time of this dictation.   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 She used to work at [employer]. Her husband Alison [name] works for [employer] division at a [employer] their son Alison [name] is currently at home.    ADVANCED DIRECTIVES: Not in place   HEALTH MAINTENANCE: Social History  Substance Use Topics  . Smoking status: Former Smoker -- 1.00 packs/day    Types: Cigarettes    Quit date: 11/25/2008  . Smokeless tobacco: Not on file  . Alcohol Use: No     Colonoscopy: April 2014/ Raynaldo Opitz at [facility]  PAP: s/p hysterectomy  Bone density: August 2016/ osteopenia  Lipid panel:  Allergies  Allergen Reactions  . Sulfa  Antibiotics Other (See Comments)    Blisters in mouth    Current Outpatient Prescriptions  Medication Sig Dispense Refill  . cetirizine (ZYRTEC) 10 MG tablet Take 10 mg by mouth daily.    . Cholecalciferol (VITAMIN D3) 2000 UNITS capsule Take 2,000 Units by mouth daily.     Marland Kitchen dextroamphetamine (DEXEDRINE SPANSULE) 15 MG 24 hr capsule Take 15 mg by mouth 2 (two) times daily.     . eszopiclone (LUNESTA) 2 MG TABS tablet Take 1 tablet (2 mg total) by mouth at bedtime. Take immediately before bedtime 30 tablet 0  . Fenofibric Acid 105 MG TABS Take 1 tablet by mouth daily.    . fluconazole (DIFLUCAN) 100 MG tablet Take 1 tablet (100 mg total) by mouth daily. 30 tablet 0  . fluticasone (FLONASE) 50 MCG/ACT nasal spray Place 2 sprays into both nostrils daily. Reported on 09/08/2015    . gabapentin (NEURONTIN) 300 MG capsule Take 300 mg by mouth at bedtime.    Vanessa Kick Ethyl (VASCEPA) 1 G CAPS Take 2 capsules by mouth 2 (two) times daily.    . Multiple Vitamin (MULTIVITAMIN) tablet Take 1 tablet by mouth daily.    Marland Kitchen NIFEdipine (PROCARDIA-XL/ADALAT-CC/NIFEDICAL-XL) 30 MG 24  hr tablet Take 30 mg by mouth daily.     Marland Kitchen olmesartan (BENICAR) 20 MG tablet Take 1 tablet (20 mg total) by mouth daily. 90 tablet 4  . omeprazole (PRILOSEC) 40 MG capsule Take 1 capsule (40 mg total) by mouth daily. 30 capsule 2  . polyethylene glycol (MIRALAX / GLYCOLAX) packet Take 17 g by mouth daily. Reported on 10/06/2015    . Probiotic Product (PROBIOTIC DAILY PO) Take 1 capsule by mouth daily.     . prochlorperazine (COMPAZINE) 10 MG tablet Take 1 tablet (10 mg total) by mouth every 6 (six) hours as needed (Nausea or vomiting). 30 tablet 1  . valACYclovir (VALTREX) 500 MG tablet Take 1 tablet (500 mg total) by mouth 2 (two) times daily. 30 tablet 1  . clobetasol cream (TEMOVATE) 5.02 % Apply 1 application topically 2 (two) times daily. (Patient not taking: Reported on 11/04/2015) 30 g 0  . doxycycline (VIBRA-TABS) 100 MG  tablet Take 1 tablet (100 mg total) by mouth daily. (Patient not taking: Reported on 11/11/2015) 30 tablet 1  . LORazepam (ATIVAN) 0.5 MG tablet Take 1 tablet (0.5 mg total) by mouth at bedtime. (Patient not taking: Reported on 11/11/2015) 30 tablet 0  . ondansetron (ZOFRAN) 8 MG tablet Take 1 tablet (8 mg total) by mouth every 8 (eight) hours as needed for nausea or vomiting. (Patient not taking: Reported on 11/11/2015) 30 tablet 0  . UNABLE TO FIND Take 2 tablets by mouth at bedtime. Reported on 11/11/2015     No current facility-administered medications for this visit.    OBJECTIVE: Middle-aged white woman in no acute distress Filed Vitals:   11/11/15 1309  BP: 102/58  Pulse: 101  Temp: 98.6 F (37 C)  Resp: 18     Body mass index is 27.29 kg/(m^2).    ECOG FS:1 - Symptomatic but completely ambulatory  Skin: warm, dry  HEENT: sclerae anicteric, conjunctivae pink, oropharynx clear. No thrush or mucositis.  Lymph Nodes: No cervical or supraclavicular lymphadenopathy  Lungs: clear to auscultation bilaterally, no rales, wheezes, or rhonci  Heart: regular rate and rhythm  Abdomen: round, soft, non tender, positive bowel sounds  Musculoskeletal: No focal spinal tenderness, no peripheral edema  Neuro: non focal, well oriented, positive affect  Breasts: deferred  LAB RESULTS:  CMP     Component Value Date/Time   NA 140 11/11/2015 1214   K 4.0 11/11/2015 1214   CO2 24 11/11/2015 1214   GLUCOSE 122 11/11/2015 1214   BUN 11.7 11/11/2015 1214   CREATININE 0.8 11/11/2015 1214   CALCIUM 9.4 11/11/2015 1214   PROT 7.3 11/11/2015 1214   ALBUMIN 3.9 11/11/2015 1214   AST 20 11/11/2015 1214   ALT 18 11/11/2015 1214   ALKPHOS 47 11/11/2015 1214   BILITOT 0.30 11/11/2015 1214    INo results found for: SPEP, UPEP  Lab Results  Component Value Date   WBC 6.5 11/11/2015   NEUTROABS 4.3 11/11/2015   HGB 10.0* 11/11/2015   HCT 29.3* 11/11/2015   MCV 98.7 11/11/2015   PLT 422*  11/11/2015      Chemistry      Component Value Date/Time   NA 140 11/11/2015 1214   K 4.0 11/11/2015 1214   CO2 24 11/11/2015 1214   BUN 11.7 11/11/2015 1214   CREATININE 0.8 11/11/2015 1214      Component Value Date/Time   CALCIUM 9.4 11/11/2015 1214   ALKPHOS 47 11/11/2015 1214   AST 20 11/11/2015 1214  ALT 18 11/11/2015 1214   BILITOT 0.30 11/11/2015 1214       No results found for: LABCA2  No components found for: [id number]  No results for input(s): INR in the last 168 hours.  Urinalysis No results found for: COLORURINE, APPEARANCEUR, LABSPEC, PHURINE, GLUCOSEU, HGBUR, BILIRUBINUR, KETONESUR, PROTEINUR, UROBILINOGEN, NITRITE, LEUKOCYTESUR  STUDIES: Mr Breast Bilateral W Wo Contrast  10/19/2015  CLINICAL DATA:  Patient with known left breast carcinoma. Restaging following neoadjuvant chemotherapy. LABS:  Most recent labs obtained on 10/14/2015. Creatinine 0.8. Normal GFR. EXAM: BILATERAL BREAST MRI WITH AND WITHOUT CONTRAST TECHNIQUE: Multiplanar, multisequence MR images of both breasts were obtained prior to and following the intravenous administration of 14 ml of MultiHance. THREE-DIMENSIONAL MR IMAGE RENDERING ON INDEPENDENT WORKSTATION: Three-dimensional MR images were rendered by post-processing of the original MR data on an independent workstation. The three-dimensional MR images were interpreted, and findings are reported in the following complete MRI report for this study. Three dimensional images were evaluated at the independent DynaCad workstation COMPARISON:  Previous mammograms and ultrasounds. Prior breast MRI, 08/16/2015. FINDINGS: Breast composition: c. Heterogeneous fibroglandular tissue. Background parenchymal enhancement: Mild Right breast: There are no areas of abnormal enhancement. Left breast: The previously seen diffuse breast enhancement has significantly improved with neoadjuvant chemotherapy. There is a focal area of enhancement in the upper outer  quadrant measuring 10 x 8 x 12 mm. This lies adjacent to the biopsy clip. A smaller, 5 mm, area of enhancement lies posterior to this. There is some generalized hazy non mass enhancement that persists most evident in the medial aspect of the breast. Lymph nodes: No abnormal appearing lymph nodes. Ancillary findings:  None. IMPRESSION: 1. Positive response to neoadjuvant chemotherapy. There has been a significant reduction in the diffuse abnormal left breast enhancement seen on the prior MRI. There is a focal, 10 x 8 x 12 mm area of enhancement near the biopsy clip in the upper outer left breast. Also some mild, regional, residual non mass enhancement along the medial aspect of the left breast. 2. No evidence of malignancy in the right breast. 3. No adenopathy. RECOMMENDATION: Treatment as planned for the known left breast carcinoma. BI-RADS CATEGORY  6: Known biopsy-proven malignancy. Electronically Signed   By: Lajean Manes M.D.   On: 10/19/2015 15:41    ASSESSMENT: 55 y.o. [place] woman status post 2 separate masses in the left breast 08/04/2015, clinically mT2 N1, stage IIb/IIIa invasive ductal carcinoma, grade 3, the larger mass being estrogen and progesterone receptor positive, HER-2 negative, with an MIB-1 of 12%  (a) biopsy of a left axillary lymph node 08/09/2015 was positive, with extracapsular extension  (1) neoadjuvant chemotherapy started 08/25/2015 consisting of doxorubicin and cyclophosphamide in dose dense fashion 4, completed 10/06/2015,  followed by weekly paclitaxel 12  (2) definitive surgery to follow  (3) adjuvant radiation to follow surgery  (4) anti-estrogens to follow completion of local treatment  (a) bone density August 2016 showed osteopenia  (b) the patient is status post total abdominal hysterectomy with bilateral salpingo-oophorectomy.  (5) the patient appears to be a good candidate for the [facility] trial  PLAN: The labs were reviewed in detail and her hgb has  improved to 10.0 today. She feels better since her rest is improved with lunesta. She will proceed with cycle 4 of paclitaxel as planned today.  Alison [name] will return in 1 week for cycle 5 of treatment. She understands and agrees with this plan. She knows the goal of treatment in her case is  cure. She has been encouraged to call with any issues that might arise before her next visit here.   Laurie Panda, NP   11/11/2015 1:52 PM

## 2015-11-11 NOTE — Telephone Encounter (Signed)
appt added per 3/17 pof. avs to printed in treatment room

## 2015-11-12 ENCOUNTER — Emergency Department (HOSPITAL_COMMUNITY)
Admission: EM | Admit: 2015-11-12 | Discharge: 2015-11-12 | Disposition: A | Discharge status: Home / Self Care | Payer: 59 | Attending: Physician Assistant | Admitting: Physician Assistant

## 2015-11-12 ENCOUNTER — Encounter (HOSPITAL_COMMUNITY): Payer: Self-pay | Admitting: Emergency Medicine

## 2015-11-12 DIAGNOSIS — N390 Urinary tract infection, site not specified: Secondary | ICD-10-CM | POA: Diagnosis not present

## 2015-11-12 DIAGNOSIS — Z79899 Other long term (current) drug therapy: Secondary | ICD-10-CM | POA: Insufficient documentation

## 2015-11-12 DIAGNOSIS — Z87891 Personal history of nicotine dependence: Secondary | ICD-10-CM | POA: Insufficient documentation

## 2015-11-12 DIAGNOSIS — Z85828 Personal history of other malignant neoplasm of skin: Secondary | ICD-10-CM | POA: Insufficient documentation

## 2015-11-12 DIAGNOSIS — M8580 Other specified disorders of bone density and structure, unspecified site: Secondary | ICD-10-CM | POA: Insufficient documentation

## 2015-11-12 DIAGNOSIS — Z8742 Personal history of other diseases of the female genital tract: Secondary | ICD-10-CM | POA: Diagnosis not present

## 2015-11-12 DIAGNOSIS — C50919 Malignant neoplasm of unspecified site of unspecified female breast: Secondary | ICD-10-CM | POA: Insufficient documentation

## 2015-11-12 DIAGNOSIS — R300 Dysuria: Secondary | ICD-10-CM | POA: Diagnosis present

## 2015-11-12 DIAGNOSIS — Z7952 Long term (current) use of systemic steroids: Secondary | ICD-10-CM | POA: Diagnosis not present

## 2015-11-12 DIAGNOSIS — R000 Tachycardia, unspecified: Secondary | ICD-10-CM | POA: Diagnosis not present

## 2015-11-12 DIAGNOSIS — I10 Essential (primary) hypertension: Secondary | ICD-10-CM | POA: Insufficient documentation

## 2015-11-12 LAB — COMPREHENSIVE METABOLIC PANEL
ALT: 18 U/L (ref 14–54)
AST: 23 U/L (ref 15–41)
Albumin: 4 g/dL (ref 3.5–5.0)
Alkaline Phosphatase: 37 U/L — ABNORMAL LOW (ref 38–126)
Anion gap: 8 (ref 5–15)
BUN: 10 mg/dL (ref 6–20)
CHLORIDE: 108 mmol/L (ref 101–111)
CO2: 22 mmol/L (ref 22–32)
Calcium: 9.3 mg/dL (ref 8.9–10.3)
Creatinine, Ser: 0.58 mg/dL (ref 0.44–1.00)
Glucose, Bld: 97 mg/dL (ref 65–99)
POTASSIUM: 4.3 mmol/L (ref 3.5–5.1)
SODIUM: 138 mmol/L (ref 135–145)
Total Bilirubin: 0.8 mg/dL (ref 0.3–1.2)
Total Protein: 7.1 g/dL (ref 6.5–8.1)

## 2015-11-12 LAB — CBC WITH DIFFERENTIAL/PLATELET
BASOS ABS: 0 10*3/uL (ref 0.0–0.1)
Basophils Relative: 1 %
EOS ABS: 0 10*3/uL (ref 0.0–0.7)
EOS PCT: 0 %
HCT: 26.9 % — ABNORMAL LOW (ref 36.0–46.0)
Hemoglobin: 9 g/dL — ABNORMAL LOW (ref 12.0–15.0)
LYMPHS PCT: 14 %
Lymphs Abs: 0.9 10*3/uL (ref 0.7–4.0)
MCH: 32 pg (ref 26.0–34.0)
MCHC: 33.5 g/dL (ref 30.0–36.0)
MCV: 95.7 fL (ref 78.0–100.0)
MONO ABS: 0.5 10*3/uL (ref 0.1–1.0)
Monocytes Relative: 8 %
Neutro Abs: 5.1 10*3/uL (ref 1.7–7.7)
Neutrophils Relative %: 77 %
PLATELETS: 387 10*3/uL (ref 150–400)
RBC: 2.81 MIL/uL — ABNORMAL LOW (ref 3.87–5.11)
RDW: 18.6 % — ABNORMAL HIGH (ref 11.5–15.5)
WBC: 6.6 10*3/uL (ref 4.0–10.5)

## 2015-11-12 LAB — URINALYSIS, ROUTINE W REFLEX MICROSCOPIC
BILIRUBIN URINE: NEGATIVE
GLUCOSE, UA: NEGATIVE mg/dL
Ketones, ur: NEGATIVE mg/dL
Nitrite: NEGATIVE
PROTEIN: NEGATIVE mg/dL
Specific Gravity, Urine: 1.006 (ref 1.005–1.030)
pH: 6.5 (ref 5.0–8.0)

## 2015-11-12 LAB — URINE MICROSCOPIC-ADD ON: SQUAMOUS EPITHELIAL / LPF: NONE SEEN

## 2015-11-12 MED ORDER — SODIUM CHLORIDE 0.9 % IV BOLUS (SEPSIS)
1000.0000 mL | Freq: Once | INTRAVENOUS | Status: AC
  Administered 2015-11-12: 1000 mL via INTRAVENOUS

## 2015-11-12 MED ORDER — HEPARIN SOD (PORK) LOCK FLUSH 100 UNIT/ML IV SOLN
500.0000 [IU] | Freq: Once | INTRAVENOUS | Status: AC
  Administered 2015-11-12: 500 [IU]
  Filled 2015-11-12: qty 5

## 2015-11-12 MED ORDER — CEPHALEXIN 500 MG PO CAPS
500.0000 mg | ORAL_CAPSULE | Freq: Once | ORAL | Status: AC
  Administered 2015-11-12: 500 mg via ORAL
  Filled 2015-11-12: qty 1

## 2015-11-12 MED ORDER — CEPHALEXIN 500 MG PO CAPS: 500.0000 mg | ORAL_CAPSULE | Freq: Two times a day (BID) | ORAL | Status: DC

## 2015-11-12 NOTE — ED Notes (Signed)
Pt reports dysuria that began this am. No fever at home. Pt had chemo for breast cancer yesterday.

## 2015-11-12 NOTE — ED Provider Notes (Signed)
CSN: [id number]     Arrival date & time 11/12/15  [id number] History   First MD Initiated Contact with Patient 11/12/15 [phone]     Chief Complaint  Patient presents with  . chemo card   . Dysuria     (Consider location/radiation/quality/duration/timing/severity/associated sxs/prior Treatment) HPI   She is a very pleasant 55 year old female with history of breast cancer. Currently undergoing chemotherapy.   Patient woke up this morning and noticed to have burning with urination. Patient does not have any symptoms before today. Patient did not notice any fever today.  She states that she's baseline has a high heart rate but she's not been taking as much fluid for the last 2 days because of the taste. Patient is on Taxol.  Patient had no other systemic symptoms, no exposures to flu no nausea no vomiting or diarrhea.  Patient googled her symptoms and had some leftover doxycycline, so she took 2 100 mg pills  Past Medical History  Diagnosis Date  . Hypertension   . Raynaud's disease   . Skin cancer   . Rectovaginal fistula     repaired 2014  . Osteopenia determined by x-ray    Past Surgical History  Procedure Laterality Date  . C-section    . Abdominal hysterectomy      with BSO  . Appendectomy    . Colon resection    . Hernia repair     Family History  Problem Relation Age of Onset  . Lung cancer Father    Social History  Substance Use Topics  . Smoking status: Former Smoker -- 1.00 packs/day    Types: Cigarettes    Quit date: 11/25/2008  . Smokeless tobacco: None  . Alcohol Use: No   OB History    No data available     Review of Systems  Constitutional: Negative for fever and activity change.  HENT: Negative for congestion.   Respiratory: Negative for shortness of breath.   Cardiovascular: Negative for chest pain.  Gastrointestinal: Negative for abdominal pain.  Genitourinary: Positive for dysuria. Negative for difficulty urinating.  Musculoskeletal: Negative for  back pain.  Neurological: Negative for dizziness.  All other systems reviewed and are negative.     Allergies  Sulfa antibiotics  Home Medications   Prior to Admission medications   Medication Sig Start Date End Date Taking? Authorizing Provider  cetirizine (ZYRTEC) 10 MG tablet Take 10 mg by mouth daily.   Yes Historical Provider, MD  Cholecalciferol (VITAMIN D3) 2000 UNITS capsule Take 2,000 Units by mouth daily.    Yes Historical Provider, MD  dextroamphetamine (DEXEDRINE SPANSULE) 15 MG 24 hr capsule Take 15 mg by mouth 2 (two) times daily.    Yes Historical Provider, MD  doxycycline (VIBRA-TABS) 100 MG tablet Take 1 tablet (100 mg total) by mouth daily. 10/06/15  Yes Laurie Panda, NP  eszopiclone (LUNESTA) 2 MG TABS tablet Take 1 tablet (2 mg total) by mouth at bedtime. Take immediately before bedtime 11/09/15  Yes Heather F Boelter, NP  Fenofibric Acid 105 MG TABS Take 1 tablet by mouth daily.   Yes Historical Provider, MD  fluconazole (DIFLUCAN) 100 MG tablet Take 1 tablet (100 mg total) by mouth daily. 10/27/15  Yes Laurie Panda, NP  fluticasone (FLONASE) 50 MCG/ACT nasal spray Place 2 sprays into both nostrils daily. Reported on 09/08/2015   Yes Historical Provider, MD  gabapentin (NEURONTIN) 300 MG capsule Take 300 mg by mouth 3 (three) times daily.    Yes  Historical Provider, MD  Icosapent Ethyl (VASCEPA) 1 G CAPS Take 2 capsules by mouth 2 (two) times daily.   Yes Historical Provider, MD  Multiple Vitamin (MULTIVITAMIN) tablet Take 1 tablet by mouth daily.   Yes Historical Provider, MD  NIFEdipine (PROCARDIA-XL/ADALAT-CC/NIFEDICAL-XL) 30 MG 24 hr tablet Take 30 mg by mouth daily.    Yes Historical Provider, MD  olmesartan (BENICAR) 20 MG tablet Take 1 tablet (20 mg total) by mouth daily. 10/20/15  Yes Chauncey Cruel, MD  omeprazole (PRILOSEC) 40 MG capsule Take 1 capsule (40 mg total) by mouth daily. 09/01/15  Yes Laurie Panda, NP  polyethylene glycol (MIRALAX /  GLYCOLAX) packet Take 17 g by mouth daily. Reported on 10/06/2015   Yes Historical Provider, MD  Probiotic Product (PROBIOTIC DAILY PO) Take 1 capsule by mouth daily.    Yes Historical Provider, MD  prochlorperazine (COMPAZINE) 10 MG tablet Take 1 tablet (10 mg total) by mouth every 6 (six) hours as needed (Nausea or vomiting). 10/14/15  Yes Laurie Panda, NP  valACYclovir (VALTREX) 500 MG tablet Take 1 tablet (500 mg total) by mouth 2 (two) times daily. 11/11/15  Yes Laurie Panda, NP  clobetasol cream (TEMOVATE) [id number] % Apply 1 application topically 2 (two) times daily. Patient not taking: Reported on 11/04/2015 09/29/15   Laurie Panda, NP  LORazepam (ATIVAN) 0.5 MG tablet Take 1 tablet (0.5 mg total) by mouth at bedtime. Patient not taking: Reported on 11/11/2015 10/14/15   Laurie Panda, NP  ondansetron (ZOFRAN) 8 MG tablet Take 1 tablet (8 mg total) by mouth every 8 (eight) hours as needed for nausea or vomiting. Patient not taking: Reported on 11/11/2015 11/07/15   Chauncey Cruel, MD   BP 125/77 mmHg  Pulse 101  Temp(Src) 97.8 F (36.6 C) (Oral)  Resp 16  SpO2 99% Physical Exam  Constitutional: She is oriented to person, place, and time. She appears well-developed and well-nourished.  HENT:  Head: Normocephalic and atraumatic.  Eyes: Conjunctivae are normal. Right eye exhibits no discharge.  Neck: Neck supple.  Cardiovascular: Regular rhythm and normal heart sounds.   No murmur heard. Tachycardia.  Pulmonary/Chest: Effort normal and breath sounds normal. She has no wheezes. She has no rales.  Abdominal: Soft. She exhibits no distension. There is no tenderness.  Musculoskeletal: Normal range of motion. She exhibits no edema.  Neurological: She is oriented to person, place, and time. No cranial nerve deficit.  Skin: Skin is warm and dry. No rash noted. She is not diaphoretic.  Psychiatric: She has a normal mood and affect. Her behavior is normal.  Nursing note and vitals  reviewed.   ED Course  Procedures (including critical care time) Labs Review Labs Reviewed  URINALYSIS, ROUTINE W REFLEX MICROSCOPIC (NOT AT [facility]) - Abnormal; Notable for the following:    Hgb urine dipstick MODERATE (*)    Leukocytes, UA MODERATE (*)    All other components within normal limits  CBC WITH DIFFERENTIAL/PLATELET - Abnormal; Notable for the following:    RBC 2.81 (*)    Hemoglobin 9.0 (*)    HCT 26.9 (*)    RDW 18.6 (*)    All other components within normal limits  COMPREHENSIVE METABOLIC PANEL - Abnormal; Notable for the following:    Alkaline Phosphatase 37 (*)    All other components within normal limits  URINE MICROSCOPIC-ADD ON - Abnormal; Notable for the following:    Bacteria, UA RARE (*)    All other components  within normal limits    Imaging Review No results found. I have personally reviewed and evaluated these images and lab results as part of my medical decision-making.   EKG Interpretation None      MDM   Final diagnoses:  None    Patient is a very pleasant 55 year old female presently on chemotherapy for breast cancer. She is presenting with several hours of dysuria. She took antibiotics at home for this. Patient noted no fever. No nausea no vomiting diarrhea. Patient has isolated tachycardia today. Patient states she's been drinking less fluid than usual but usually does have a higher heart rate than average.  We'll access her port, get CBC Chem-7 give fluids. We are awaiting urine.  1:06 PM 2 L, tachycardic only to 101. Feels Very improved. We will give treatment of Keflex for urine tract infection.  Naftoli Penny Julio Alm, MD 11/12/15 1306

## 2015-11-14 ENCOUNTER — Other Ambulatory Visit: Payer: Self-pay | Admitting: Nurse Practitioner

## 2015-11-15 ENCOUNTER — Other Ambulatory Visit: Payer: Self-pay | Admitting: Nurse Practitioner

## 2015-11-16 ENCOUNTER — Other Ambulatory Visit: Payer: Self-pay | Admitting: *Deleted

## 2015-11-16 DIAGNOSIS — C50412 Malignant neoplasm of upper-outer quadrant of left female breast: Secondary | ICD-10-CM

## 2015-11-16 MED ORDER — PROCHLORPERAZINE MALEATE 10 MG PO TABS: 10.0000 mg | ORAL_TABLET | Freq: Four times a day (QID) | ORAL | Status: DC | PRN

## 2015-11-18 ENCOUNTER — Ambulatory Visit (HOSPITAL_BASED_OUTPATIENT_CLINIC_OR_DEPARTMENT_OTHER): Payer: 59 | Admitting: Nurse Practitioner

## 2015-11-18 ENCOUNTER — Other Ambulatory Visit (HOSPITAL_BASED_OUTPATIENT_CLINIC_OR_DEPARTMENT_OTHER): Payer: 59

## 2015-11-18 ENCOUNTER — Ambulatory Visit (HOSPITAL_BASED_OUTPATIENT_CLINIC_OR_DEPARTMENT_OTHER): Payer: 59

## 2015-11-18 ENCOUNTER — Encounter: Payer: Self-pay | Admitting: Nurse Practitioner

## 2015-11-18 VITALS — BP 102/58 | HR 86 | Temp 97.9°F | Resp 18 | Ht 63.0 in | Wt 154.3 lb

## 2015-11-18 DIAGNOSIS — C50412 Malignant neoplasm of upper-outer quadrant of left female breast: Secondary | ICD-10-CM | POA: Diagnosis not present

## 2015-11-18 DIAGNOSIS — C773 Secondary and unspecified malignant neoplasm of axilla and upper limb lymph nodes: Secondary | ICD-10-CM | POA: Diagnosis not present

## 2015-11-18 DIAGNOSIS — Z170 Estrogen receptor positive status [ER+]: Secondary | ICD-10-CM

## 2015-11-18 DIAGNOSIS — Z5111 Encounter for antineoplastic chemotherapy: Secondary | ICD-10-CM

## 2015-11-18 LAB — COMPREHENSIVE METABOLIC PANEL
ALBUMIN: 3.7 g/dL (ref 3.5–5.0)
ALT: 20 U/L (ref 0–55)
AST: 22 U/L (ref 5–34)
Alkaline Phosphatase: 42 U/L (ref 40–150)
Anion Gap: 7 mEq/L (ref 3–11)
BUN: 13 mg/dL (ref 7.0–26.0)
CALCIUM: 9.4 mg/dL (ref 8.4–10.4)
CO2: 23 meq/L (ref 22–29)
CREATININE: 0.9 mg/dL (ref 0.6–1.1)
Chloride: 108 mEq/L (ref 98–109)
EGFR: 72 mL/min/{1.73_m2} — ABNORMAL LOW (ref >90)
GLUCOSE: 139 mg/dL (ref 70–140)
Potassium: 4.3 mEq/L (ref 3.5–5.1)
Sodium: 139 mEq/L (ref 136–145)
Total Protein: 6.7 g/dL (ref 6.4–8.3)

## 2015-11-18 LAB — CBC WITH DIFFERENTIAL/PLATELET
BASO%: 3.6 % — ABNORMAL HIGH (ref 0.0–2.0)
BASOS ABS: 0.2 10*3/uL — AB (ref 0.0–0.1)
EOS ABS: 0.1 10*3/uL (ref 0.0–0.5)
EOS%: 2.3 % (ref 0.0–7.0)
HEMATOCRIT: 28.3 % — AB (ref 34.8–46.6)
HEMOGLOBIN: 9.4 g/dL — AB (ref 11.6–15.9)
LYMPH#: 1.1 10*3/uL (ref 0.9–3.3)
LYMPH%: 20.1 % (ref 14.0–49.7)
MCH: 33.2 pg (ref 25.1–34.0)
MCHC: 33.3 g/dL (ref 31.5–36.0)
MCV: 99.7 fL (ref 79.5–101.0)
MONO#: 0.4 10*3/uL (ref 0.1–0.9)
MONO%: 7.6 % (ref 0.0–14.0)
NEUT#: 3.7 10*3/uL (ref 1.5–6.5)
NEUT%: 66.4 % (ref 38.4–76.8)
Platelets: 302 10*3/uL (ref 145–400)
RBC: 2.84 10*6/uL — ABNORMAL LOW (ref 3.70–5.45)
RDW: 19.1 % — AB (ref 11.2–14.5)
WBC: 5.6 10*3/uL (ref 3.9–10.3)

## 2015-11-18 MED ORDER — DIPHENHYDRAMINE HCL 50 MG/ML IJ SOLN
25.0000 mg | Freq: Once | INTRAMUSCULAR | Status: AC
  Administered 2015-11-18: 25 mg via INTRAVENOUS

## 2015-11-18 MED ORDER — DIPHENHYDRAMINE HCL 50 MG/ML IJ SOLN
INTRAMUSCULAR | Status: AC
  Filled 2015-11-18: qty 1

## 2015-11-18 MED ORDER — SODIUM CHLORIDE 0.9 % IV SOLN
80.0000 mg/m2 | Freq: Once | INTRAVENOUS | Status: AC
  Administered 2015-11-18: 138 mg via INTRAVENOUS
  Filled 2015-11-18: qty 23

## 2015-11-18 MED ORDER — FAMOTIDINE IN NACL 20-0.9 MG/50ML-% IV SOLN
INTRAVENOUS | Status: AC
  Filled 2015-11-18: qty 50

## 2015-11-18 MED ORDER — SODIUM CHLORIDE 0.9 % IV SOLN
4.0000 mg | Freq: Once | INTRAVENOUS | Status: AC
  Administered 2015-11-18: 4 mg via INTRAVENOUS
  Filled 2015-11-18: qty 0.4

## 2015-11-18 MED ORDER — HEPARIN SOD (PORK) LOCK FLUSH 100 UNIT/ML IV SOLN
500.0000 [IU] | Freq: Once | INTRAVENOUS | Status: AC | PRN
  Administered 2015-11-18: 500 [IU]
  Filled 2015-11-18: qty 5

## 2015-11-18 MED ORDER — SODIUM CHLORIDE 0.9 % IV SOLN
Freq: Once | INTRAVENOUS | Status: AC
  Administered 2015-11-18: 14:00:00 via INTRAVENOUS

## 2015-11-18 MED ORDER — FAMOTIDINE IN NACL 20-0.9 MG/50ML-% IV SOLN
20.0000 mg | Freq: Once | INTRAVENOUS | Status: AC
  Administered 2015-11-18: 20 mg via INTRAVENOUS

## 2015-11-18 MED ORDER — SODIUM CHLORIDE 0.9% FLUSH
10.0000 mL | INTRAVENOUS | Status: DC | PRN
  Administered 2015-11-18: 10 mL
  Filled 2015-11-18: qty 10

## 2015-11-18 NOTE — Patient Instructions (Addendum)
[facility] Discharge Instructions for Patients Receiving Chemotherapy  Today you received the following chemotherapy agent, Taxol.  To help prevent nausea and vomiting after your treatment, we encourage you to take your nausea medication, Compazine, as prescribed.   If you develop nausea and vomiting that is not controlled by your nausea medication, call the clinic.   BELOW ARE SYMPTOMS THAT SHOULD BE REPORTED IMMEDIATELY:  *FEVER GREATER THAN 100.5 F  *CHILLS WITH OR WITHOUT FEVER  NAUSEA AND VOMITING THAT IS NOT CONTROLLED WITH YOUR NAUSEA MEDICATION  *UNUSUAL SHORTNESS OF BREATH  *UNUSUAL BRUISING OR BLEEDING  TENDERNESS IN MOUTH AND THROAT WITH OR WITHOUT PRESENCE OF ULCERS  *URINARY PROBLEMS  *BOWEL PROBLEMS  UNUSUAL RASH Items with * indicate a potential emergency and should be followed up as soon as possible.  Feel free to call the clinic you have any questions or concerns. The clinic phone number is [phone].  Please show the [facility] at check-in to the Emergency Department and triage nurse.    Paclitaxel injection What is this medicine? PACLITAXEL (PAK li TAX el) is a chemotherapy drug. It targets fast dividing cells, like cancer cells, and causes these cells to die. This medicine is used to treat ovarian cancer, breast cancer, and other cancers. This medicine may be used for other purposes; ask your health care provider or pharmacist if you have questions. What should I tell my health care provider before I take this medicine? They need to know if you have any of these conditions: -blood disorders -irregular heartbeat -infection (especially a virus infection such as chickenpox, cold sores, or herpes) -liver disease -previous or ongoing radiation therapy -an unusual or allergic reaction to paclitaxel, alcohol, polyoxyethylated castor oil, other chemotherapy agents, other medicines, foods, dyes, or preservatives -pregnant or trying  to get pregnant -breast-feeding How should I use this medicine? This drug is given as an infusion into a vein. It is administered in a hospital or clinic by a specially trained health care professional. Talk to your pediatrician regarding the use of this medicine in children. Special care may be needed. Overdosage: If you think you have taken too much of this medicine contact a poison control center or emergency room at once. NOTE: This medicine is only for you. Do not share this medicine with others. What if I miss a dose? It is important not to miss your dose. Call your doctor or health care professional if you are unable to keep an appointment. What may interact with this medicine? Do not take this medicine with any of the following medications: -disulfiram -metronidazole This medicine may also interact with the following medications: -cyclosporine -diazepam -ketoconazole -medicines to increase blood counts like filgrastim, pegfilgrastim, sargramostim -other chemotherapy drugs like cisplatin, doxorubicin, epirubicin, etoposide, teniposide, vincristine -quinidine -testosterone -vaccines -verapamil Talk to your doctor or health care professional before taking any of these medicines: -acetaminophen -aspirin -ibuprofen -ketoprofen -naproxen This list may not describe all possible interactions. Give your health care provider a list of all the medicines, herbs, non-prescription drugs, or dietary supplements you use. Also tell them if you smoke, drink alcohol, or use illegal drugs. Some items may interact with your medicine. What should I watch for while using this medicine? Your condition will be monitored carefully while you are receiving this medicine. You will need important blood work done while you are taking this medicine. This drug may make you feel generally unwell. This is not uncommon, as chemotherapy can affect healthy cells as  healthy cells as well as cancer cells. Report any side  effects. Continue your course of treatment even though you feel ill unless your doctor tells you to stop. This medicine can cause serious allergic reactions. To reduce your risk you will need to take other medicine(s) before treatment with this medicine. In some cases, you may be given additional medicines to help with side effects. Follow all directions for their use. Call your doctor or health care professional for advice if you get a fever, chills or sore throat, or other symptoms of a cold or flu. Do not treat yourself. This drug decreases your body's ability to fight infections. Try to avoid being around people who are sick. This medicine may increase your risk to bruise or bleed. Call your doctor or health care professional if you notice any unusual bleeding. Be careful brushing and flossing your teeth or using a toothpick because you may get an infection or bleed more easily. If you have any dental work done, tell your dentist you are receiving this medicine. Avoid taking products that contain aspirin, acetaminophen, ibuprofen, naproxen, or ketoprofen unless instructed by your doctor. These medicines may hide a fever. Do not become pregnant while taking this medicine. Women should inform their doctor if they wish to become pregnant or think they might be pregnant. There is a potential for serious side effects to an unborn child. Talk to your health care professional or pharmacist for more information. Do not breast-feed an infant while taking this medicine. Men are advised not to father a child while receiving this medicine. This product may contain alcohol. Ask your pharmacist or healthcare provider if this medicine contains alcohol. Be sure to tell all healthcare providers you are taking this medicine. Certain medicines, like metronidazole and disulfiram, can cause an unpleasant reaction when taken with alcohol. The reaction includes flushing, headache, nausea, vomiting, sweating, and increased  thirst. The reaction can last from 30 minutes to several hours. What side effects may I notice from receiving this medicine? Side effects that you should report to your doctor or health care professional as soon as possible: -allergic reactions like skin rash, itching or hives, swelling of the face, lips, or tongue -low blood counts - This drug may decrease the number of white blood cells, red blood cells and platelets. You may be at increased risk for infections and bleeding. -signs of infection - fever or chills, cough, sore throat, pain or difficulty passing urine -signs of decreased platelets or bleeding - bruising, pinpoint red spots on the skin, black, tarry stools, nosebleeds -signs of decreased red blood cells - unusually weak or tired, fainting spells, lightheadedness -breathing problems -chest pain -high or low blood pressure -mouth sores -nausea and vomiting -pain, swelling, redness or irritation at the injection site -pain, tingling, numbness in the hands or feet -slow or irregular heartbeat -swelling of the ankle, feet, hands Side effects that usually do not require medical attention (report to your doctor or health care professional if they continue or are bothersome): -bone pain -complete hair loss including hair on your head, underarms, pubic hair, eyebrows, and eyelashes -changes in the color of fingernails -diarrhea -loosening of the fingernails -loss of appetite -muscle or joint pain -red flush to skin -sweating This list may not describe all possible side effects. Call your doctor for medical advice about side effects. You may report side effects to FDA at 1-800-FDA-1088. Where should I keep my medicine? This drug is given in a hospital or clinic and will   at home. NOTE: This sheet is a summary. It may not cover all possible information. If you have questions about this medicine, talk to your doctor, pharmacist, or health care provider.

## 2015-11-18 NOTE — Progress Notes (Signed)
Alison [name]  [phone] [phone]) [phone]   ID: Alison [name] DOB: 1961/03/09  MR#: [id number]  YQI#:[id number]  Patient Care Team: Alison Pao, MD as PCP - General (Internal Medicine) Alison Luna, MD as Consulting Physician (General Surgery) Alison Cruel, MD as Consulting Physician (Oncology) Alison Silversmith, MD as Consulting Physician (Radiation Oncology) Alison Gell, MD as Consulting Physician (Obstetrics and Gynecology) Alison Sine, MD as Consulting Physician (Dermatology) PCP: Alison Pao, MD OTHER MD:  CHIEF COMPLAINT: Estrogen receptor positive breast cancer  CURRENT TREATMENT: Neoadjuvant chemotherapy  BREAST CANCER HISTORY: From the original intake note:  Alison [name] had routine screening mammography with tomography [facility] February 20 11/14/2014 showing a possible mass in the right breast. Diagnostic right mammography with right breast ultrasonography 11/01/2014 found the breast density to be category C. In the upper outer quadrant of the right breast there was a partially obscured mass which on physical exam was palpable as an area of thickening at the 10:00 position 8 cm from the nipple. Ultrasound showed multiple cysts in the upper outer right breast corresponding to the mass in question.  In November 2016 the patient felt a change in the upper left breast and brought it to her primary care physician's attention. Diagnostic left mammography with tomosynthesis and left breast ultrasonography at [facility] December 01/14/2015 found trabecular thickening throughout the left breast with skin thickening, but no circumscribed mass or suspicious calcifications. A rounded mass in the left axilla measured 6 mm. There was an additional mildly prominent lymph node in the left axilla which was what the patient had been palpating. Ultrasonography showed ill-defined swelling throughout the inner left breast with overlying  skin thickening.. At the 2:00 position 4 cm from the nipple there was an irregular hypoechoic mass measuring 2.3 cm. A second mass at the 1:00 area 3 cm from the nipple measured 1 cm. The distance between these 2 masses was 1.8 cm. There was also a round hypoechoic left axillary mass measuring 0.6 cm. Overall the was an area of 3.7 cm of mixed echogenicity corresponding to the area of palpable soft tissue swelling.   On 08/04/2015 the patient underwent biopsy of the mass at the 2:00 axis 4 cm from the nipple and a second biopsy was performed of the hypoechoic mass at the 1:00 position 3 cm from the nipple. Biopsy of the suspicious nodule in the lower left axilla was attempted but could not be completed as the mass became obscured after administration of lidocaine.  The pathology from both left breast biopsies 08/04/2015 ([id number]) showed invasive ductal carcinoma, grade 3. The prognostic profile obtained from the larger tumor showed estrogen receptor to be strongly positive at 95%, progesterone receptor positive at 30%, with an MIB-1 of 12%, and no HER-2 amplification, the signals ratio being 1.22 and the number per cell 2.20.  On 08/09/2015 the patient underwent biopsy of this suspicious left axillary lymph node noted above, and this showed ([id number]) metastatic carcinoma with extracapsular extension.  The patient's subsequent history is as detailed below  INTERVAL HISTORY: Alison [name] returns for follow up of her estrogen receptor positive breast cancer, accompanied by her Alison Alison [name]. Today is day 1, cycle 5 of 12 planned cycles of weekly paclitaxel.  REVIEW OF SYSTEMS: Since her last visit, Alison [name] has visited the ED for a UTI. She was discharged home with keflex, and this cleared up her symptoms almost immediately. The keflex may have caused her diarrhea because she had  an explosive episode and continual loose stools since Wednesday when usually she is constipated. She denies fevers, chills,  nausea, or vomiting. Her appetite is good. She denies mouth sores, rashes, or neuropathy symptoms. She is sleeping well on lunesta. A detailed review of systems is otherwise stable.  PAST MEDICAL HISTORY: Past Medical History  Diagnosis Date  . Hypertension   . Raynaud's disease   . Skin cancer   . Rectovaginal fistula     repaired 2014  . Osteopenia determined by x-ray     PAST SURGICAL HISTORY: Past Surgical History  Procedure Laterality Date  . C-section    . Abdominal hysterectomy      with BSO  . Appendectomy    . Colon resection    . Hernia repair      FAMILY HISTORY Family History  Problem Relation Age of Onset  . Lung cancer Father    the patient's father died from lung cancer at the age of 97 in the setting of tobacco abuse. The patient's mother died at the age of 43 with pancreatic cancer. The patient had no siblings. There is no history of breast or ovarian cancer in the family.  GYNECOLOGIC HISTORY:  No LMP recorded. Patient has had a hysterectomy. Menarche age 42, first live birth age 39. The patient is GX P1. She is status post total hysterectomy with bilateral salpingo-oophorectomy. She has been on hormone replacement since that time and is tapering to off at the time of this dictation.   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 She used to work at [employer]. Her Alison [name] works for [employer] division at a [employer] their son Alison [name] is currently at home.    ADVANCED DIRECTIVES: Not in place   HEALTH MAINTENANCE: Social History  Substance Use Topics  . Smoking status: Former Smoker -- 1.00 packs/day    Types: Cigarettes    Quit date: 11/25/2008  . Smokeless tobacco: Not on file  . Alcohol Use: No     Colonoscopy: April 2014/ Raynaldo Opitz at [facility]  PAP: s/p hysterectomy  Bone density: August 2016/ osteopenia  Lipid panel:  Allergies  Allergen Reactions  . Sulfa Antibiotics Other (See Comments)    Blisters in  mouth    Current Outpatient Prescriptions  Medication Sig Dispense Refill  . cephALEXin (KEFLEX) 500 MG capsule Take 1 capsule (500 mg total) by mouth 2 (two) times daily. 14 capsule 0  . cetirizine (ZYRTEC) 10 MG tablet Take 10 mg by mouth daily.    . Cholecalciferol (VITAMIN D3) 2000 UNITS capsule Take 2,000 Units by mouth daily.     Marland Kitchen dextroamphetamine (DEXEDRINE SPANSULE) 15 MG 24 hr capsule Take 15 mg by mouth 2 (two) times daily.     Marland Kitchen doxycycline (VIBRA-TABS) 100 MG tablet Take 1 tablet (100 mg total) by mouth daily. 30 tablet 1  . eszopiclone (LUNESTA) 2 MG TABS tablet Take 1 tablet (2 mg total) by mouth at bedtime. Take immediately before bedtime 30 tablet 0  . Fenofibric Acid 105 MG TABS Take 1 tablet by mouth daily.    Marland Kitchen gabapentin (NEURONTIN) 300 MG capsule Take 300 mg by mouth 3 (three) times daily.     Vanessa Kick Ethyl (VASCEPA) 1 G CAPS Take 2 capsules by mouth 2 (two) times daily.    . Multiple Vitamin (MULTIVITAMIN) tablet Take 1 tablet by mouth daily.    Marland Kitchen NIFEdipine (PROCARDIA-XL/ADALAT-CC/NIFEDICAL-XL) 30 MG 24 hr tablet Take 30 mg by mouth daily.     Marland Kitchen  olmesartan (BENICAR) 20 MG tablet Take 1 tablet (20 mg total) by mouth daily. 90 tablet 4  . omeprazole (PRILOSEC) 40 MG capsule Take 1 capsule (40 mg total) by mouth daily. 30 capsule 2  . Probiotic Product (PROBIOTIC DAILY PO) Take 1 capsule by mouth daily.     . prochlorperazine (COMPAZINE) 10 MG tablet Take 1 tablet (10 mg total) by mouth every 6 (six) hours as needed (Nausea or vomiting). 30 tablet 1  . valACYclovir (VALTREX) 500 MG tablet Take 1 tablet (500 mg total) by mouth 2 (two) times daily. 30 tablet 1  . clobetasol cream (TEMOVATE) 4.68 % Apply 1 application topically 2 (two) times daily. (Patient not taking: Reported on 11/04/2015) 30 g 0  . fluconazole (DIFLUCAN) 100 MG tablet Take 1 tablet (100 mg total) by mouth daily. (Patient not taking: Reported on 11/18/2015) 30 tablet 0  . fluticasone (FLONASE) 50  MCG/ACT nasal spray Place 2 sprays into both nostrils daily. Reported on 11/18/2015    . LORazepam (ATIVAN) 0.5 MG tablet Take 1 tablet (0.5 mg total) by mouth at bedtime. (Patient not taking: Reported on 11/11/2015) 30 tablet 0  . ondansetron (ZOFRAN) 8 MG tablet Take 1 tablet (8 mg total) by mouth every 8 (eight) hours as needed for nausea or vomiting. (Patient not taking: Reported on 11/11/2015) 30 tablet 0  . polyethylene glycol (MIRALAX / GLYCOLAX) packet Take 17 g by mouth daily. Reported on 11/18/2015     No current facility-administered medications for this visit.    OBJECTIVE: Middle-aged white woman in no acute distress Filed Vitals:   11/18/15 1311 11/18/15 1312  BP: 156/67 88/45  Pulse: 86   Temp: 97.9 F (36.6 C)   Resp: 18      Body mass index is 27.34 kg/(m^2).    ECOG FS:1 - Symptomatic but completely ambulatory  Sclerae unicteric, pupils round and equal Oropharynx clear and moist-- no thrush or other lesions No cervical or supraclavicular adenopathy Lungs no rales or rhonchi Heart regular rate and rhythm Abd soft, nontender, positive bowel sounds MSK no focal spinal tenderness, no upper extremity lymphedema Neuro: nonfocal, well oriented, appropriate affect Breasts: deferred  LAB RESULTS:  CMP     Component Value Date/Time   NA 139 11/18/2015 1232   NA 138 11/12/2015 0930   K 4.3 11/18/2015 1232   K 4.3 11/12/2015 0930   CL 108 11/12/2015 0930   CO2 23 11/18/2015 1232   CO2 22 11/12/2015 0930   GLUCOSE 139 11/18/2015 1232   GLUCOSE 97 11/12/2015 0930   BUN 13.0 11/18/2015 1232   BUN 10 11/12/2015 0930   CREATININE 0.9 11/18/2015 1232   CREATININE 0.58 11/12/2015 0930   CALCIUM 9.4 11/18/2015 1232   CALCIUM 9.3 11/12/2015 0930   PROT 6.7 11/18/2015 1232   PROT 7.1 11/12/2015 0930   ALBUMIN 3.7 11/18/2015 1232   ALBUMIN 4.0 11/12/2015 0930   AST 22 11/18/2015 1232   AST 23 11/12/2015 0930   ALT 20 11/18/2015 1232   ALT 18 11/12/2015 0930   ALKPHOS 42  11/18/2015 1232   ALKPHOS 37* 11/12/2015 0930   BILITOT <0.30 11/18/2015 1232   BILITOT 0.8 11/12/2015 0930   GFRNONAA >60 11/12/2015 0930   GFRAA >60 11/12/2015 0930    INo results found for: SPEP, UPEP  Lab Results  Component Value Date   WBC 5.6 11/18/2015   NEUTROABS 3.7 11/18/2015   HGB 9.4* 11/18/2015   HCT 28.3* 11/18/2015   MCV 99.7 11/18/2015  PLT 302 11/18/2015      Chemistry      Component Value Date/Time   NA 139 11/18/2015 1232   NA 138 11/12/2015 0930   K 4.3 11/18/2015 1232   K 4.3 11/12/2015 0930   CL 108 11/12/2015 0930   CO2 23 11/18/2015 1232   CO2 22 11/12/2015 0930   BUN 13.0 11/18/2015 1232   BUN 10 11/12/2015 0930   CREATININE 0.9 11/18/2015 1232   CREATININE 0.58 11/12/2015 0930      Component Value Date/Time   CALCIUM 9.4 11/18/2015 1232   CALCIUM 9.3 11/12/2015 0930   ALKPHOS 42 11/18/2015 1232   ALKPHOS 37* 11/12/2015 0930   AST 22 11/18/2015 1232   AST 23 11/12/2015 0930   ALT 20 11/18/2015 1232   ALT 18 11/12/2015 0930   BILITOT <0.30 11/18/2015 1232   BILITOT 0.8 11/12/2015 0930       No results found for: LABCA2  No components found for: LABCA125  No results for input(s): INR in the last 168 hours.  Urinalysis    Component Value Date/Time   COLORURINE YELLOW 11/12/2015 0837   APPEARANCEUR CLEAR 11/12/2015 0837   LABSPEC 1.006 11/12/2015 0837   PHURINE 6.5 11/12/2015 0837   GLUCOSEU NEGATIVE 11/12/2015 0837   HGBUR MODERATE* 11/12/2015 0837   BILIRUBINUR NEGATIVE 11/12/2015 0837   KETONESUR NEGATIVE 11/12/2015 0837   PROTEINUR NEGATIVE 11/12/2015 0837   NITRITE NEGATIVE 11/12/2015 0837   LEUKOCYTESUR MODERATE* 11/12/2015 0837    STUDIES: Mr Breast Bilateral W Wo Contrast  10/19/2015  CLINICAL DATA:  Patient with known left breast carcinoma. Restaging following neoadjuvant chemotherapy. LABS:  Most recent labs obtained on 10/14/2015. Creatinine 0.8. Normal GFR. EXAM: BILATERAL BREAST MRI WITH AND WITHOUT CONTRAST  TECHNIQUE: Multiplanar, multisequence MR images of both breasts were obtained prior to and following the intravenous administration of 14 ml of MultiHance. THREE-DIMENSIONAL MR IMAGE RENDERING ON INDEPENDENT WORKSTATION: Three-dimensional MR images were rendered by post-processing of the original MR data on an independent workstation. The three-dimensional MR images were interpreted, and findings are reported in the following complete MRI report for this study. Three dimensional images were evaluated at the independent DynaCad workstation COMPARISON:  Previous mammograms and ultrasounds. Prior breast MRI, 08/16/2015. FINDINGS: Breast composition: c. Heterogeneous fibroglandular tissue. Background parenchymal enhancement: Mild Right breast: There are no areas of abnormal enhancement. Left breast: The previously seen diffuse breast enhancement has significantly improved with neoadjuvant chemotherapy. There is a focal area of enhancement in the upper outer quadrant measuring 10 x 8 x 12 mm. This lies adjacent to the biopsy clip. A smaller, 5 mm, area of enhancement lies posterior to this. There is some generalized hazy non mass enhancement that persists most evident in the medial aspect of the breast. Lymph nodes: No abnormal appearing lymph nodes. Ancillary findings:  None. IMPRESSION: 1. Positive response to neoadjuvant chemotherapy. There has been a significant reduction in the diffuse abnormal left breast enhancement seen on the prior MRI. There is a focal, 10 x 8 x 12 mm area of enhancement near the biopsy clip in the upper outer left breast. Also some mild, regional, residual non mass enhancement along the medial aspect of the left breast. 2. No evidence of malignancy in the right breast. 3. No adenopathy. RECOMMENDATION: Treatment as planned for the known left breast carcinoma. BI-RADS CATEGORY  6: Known biopsy-proven malignancy. Electronically Signed   By: Lajean Manes M.D.   On: 10/19/2015 15:41     ASSESSMENT: 55 y.o. [employer]  woman status post 2 separate masses in the left breast 08/04/2015, clinically mT2 N1, stage IIb/IIIa invasive ductal carcinoma, grade 3, the larger mass being estrogen and progesterone receptor positive, HER-2 negative, with an MIB-1 of 12%  (a) biopsy of a left axillary lymph node 08/09/2015 was positive, with extracapsular extension  (1) neoadjuvant chemotherapy started 08/25/2015 consisting of doxorubicin and cyclophosphamide in dose dense fashion 4, completed 10/06/2015,  followed by weekly paclitaxel 12  (2) definitive surgery to follow  (3) adjuvant radiation to follow surgery  (4) anti-estrogens to follow completion of local treatment  (a) bone density August 2016 showed osteopenia  (b) the patient is status post total abdominal hysterectomy with bilateral salpingo-oophorectomy.  (5) the patient appears to be a good candidate for the [facility] trial  PLAN: Leanza had an interesting week, but she back to normal for the most part. The labs were reviewed in detail and were stable. She will proceed with cycle 5 of paclitaxel as planned today.  I cautioned her to take her medicines ONLY as prescribed. She doubled up on her lunesta, which put her beyond the maximum recommended dose per the manufacturer. I'm not sure why she felt motivated to do this, given that 1 tablet has been sufficient for her so far.  Lucelia will return in 1 week for cycle 6 of treatment. She understands and agrees with this plan. She knows the goal of treatment in her case is cure. She has been encouraged to call with any issues that might arise before her next visit here.   Laurie Panda, NP   11/18/2015 1:35 PM

## 2015-11-25 ENCOUNTER — Encounter: Payer: Self-pay | Admitting: Nurse Practitioner

## 2015-11-25 ENCOUNTER — Telehealth: Payer: Self-pay | Admitting: Nurse Practitioner

## 2015-11-25 ENCOUNTER — Other Ambulatory Visit (HOSPITAL_BASED_OUTPATIENT_CLINIC_OR_DEPARTMENT_OTHER): Payer: 59

## 2015-11-25 ENCOUNTER — Other Ambulatory Visit: Payer: Self-pay | Admitting: Hematology and Oncology

## 2015-11-25 ENCOUNTER — Ambulatory Visit (HOSPITAL_BASED_OUTPATIENT_CLINIC_OR_DEPARTMENT_OTHER): Payer: 59

## 2015-11-25 ENCOUNTER — Ambulatory Visit (HOSPITAL_BASED_OUTPATIENT_CLINIC_OR_DEPARTMENT_OTHER): Payer: 59 | Admitting: Nurse Practitioner

## 2015-11-25 ENCOUNTER — Other Ambulatory Visit: Payer: Self-pay | Admitting: General Surgery

## 2015-11-25 VITALS — BP 107/61 | HR 92 | Temp 97.9°F | Resp 18 | Ht 63.0 in | Wt 155.9 lb

## 2015-11-25 DIAGNOSIS — C50412 Malignant neoplasm of upper-outer quadrant of left female breast: Secondary | ICD-10-CM

## 2015-11-25 DIAGNOSIS — D6481 Anemia due to antineoplastic chemotherapy: Secondary | ICD-10-CM | POA: Diagnosis not present

## 2015-11-25 DIAGNOSIS — Z5111 Encounter for antineoplastic chemotherapy: Secondary | ICD-10-CM | POA: Diagnosis not present

## 2015-11-25 DIAGNOSIS — C50912 Malignant neoplasm of unspecified site of left female breast: Secondary | ICD-10-CM

## 2015-11-25 DIAGNOSIS — C773 Secondary and unspecified malignant neoplasm of axilla and upper limb lymph nodes: Secondary | ICD-10-CM

## 2015-11-25 DIAGNOSIS — M8580 Other specified disorders of bone density and structure, unspecified site: Secondary | ICD-10-CM

## 2015-11-25 DIAGNOSIS — R5383 Other fatigue: Secondary | ICD-10-CM

## 2015-11-25 LAB — COMPREHENSIVE METABOLIC PANEL
ALT: 17 U/L (ref 0–55)
AST: 22 U/L (ref 5–34)
Albumin: 3.7 g/dL (ref 3.5–5.0)
Alkaline Phosphatase: 39 U/L — ABNORMAL LOW (ref 40–150)
Anion Gap: 7 mEq/L (ref 3–11)
BUN: 12.5 mg/dL (ref 7.0–26.0)
CO2: 24 meq/L (ref 22–29)
Calcium: 9.7 mg/dL (ref 8.4–10.4)
Chloride: 108 mEq/L (ref 98–109)
Creatinine: 0.8 mg/dL (ref 0.6–1.1)
EGFR: 82 mL/min/{1.73_m2} — AB (ref >90)
GLUCOSE: 97 mg/dL (ref 70–140)
POTASSIUM: 4.6 meq/L (ref 3.5–5.1)
SODIUM: 139 meq/L (ref 136–145)
TOTAL PROTEIN: 6.6 g/dL (ref 6.4–8.3)

## 2015-11-25 LAB — CBC WITH DIFFERENTIAL/PLATELET
BASO%: 1.5 % (ref 0.0–2.0)
BASOS ABS: 0.1 10*3/uL (ref 0.0–0.1)
EOS%: 2.1 % (ref 0.0–7.0)
Eosinophils Absolute: 0.1 10*3/uL (ref 0.0–0.5)
HEMATOCRIT: 29.1 % — AB (ref 34.8–46.6)
HEMOGLOBIN: 9.6 g/dL — AB (ref 11.6–15.9)
LYMPH%: 29.4 % (ref 14.0–49.7)
MCH: 33.1 pg (ref 25.1–34.0)
MCHC: 33 g/dL (ref 31.5–36.0)
MCV: 100.3 fL (ref 79.5–101.0)
MONO#: 0.4 10*3/uL (ref 0.1–0.9)
MONO%: 7.4 % (ref 0.0–14.0)
NEUT#: 3.1 10*3/uL (ref 1.5–6.5)
NEUT%: 59.6 % (ref 38.4–76.8)
PLATELETS: 278 10*3/uL (ref 145–400)
RBC: 2.9 10*6/uL — AB (ref 3.70–5.45)
RDW: 16.8 % — AB (ref 11.2–14.5)
WBC: 5.2 10*3/uL (ref 3.9–10.3)
lymph#: 1.5 10*3/uL (ref 0.9–3.3)

## 2015-11-25 MED ORDER — DIPHENHYDRAMINE HCL 50 MG/ML IJ SOLN
INTRAMUSCULAR | Status: AC
  Filled 2015-11-25: qty 1

## 2015-11-25 MED ORDER — FAMOTIDINE IN NACL 20-0.9 MG/50ML-% IV SOLN
INTRAVENOUS | Status: AC
  Filled 2015-11-25: qty 50

## 2015-11-25 MED ORDER — FAMOTIDINE IN NACL 20-0.9 MG/50ML-% IV SOLN
20.0000 mg | Freq: Once | INTRAVENOUS | Status: AC
  Administered 2015-11-25: 20 mg via INTRAVENOUS

## 2015-11-25 MED ORDER — DEXAMETHASONE SODIUM PHOSPHATE 100 MG/10ML IJ SOLN
4.0000 mg | Freq: Once | INTRAMUSCULAR | Status: AC
  Administered 2015-11-25: 4 mg via INTRAVENOUS
  Filled 2015-11-25: qty 0.4

## 2015-11-25 MED ORDER — SODIUM CHLORIDE 0.9 % IV SOLN
79.0000 mg/m2 | Freq: Once | INTRAVENOUS | Status: AC
  Administered 2015-11-25: 138 mg via INTRAVENOUS
  Filled 2015-11-25: qty 23

## 2015-11-25 MED ORDER — HEPARIN SOD (PORK) LOCK FLUSH 100 UNIT/ML IV SOLN
500.0000 [IU] | Freq: Once | INTRAVENOUS | Status: AC | PRN
  Administered 2015-11-25: 500 [IU]
  Filled 2015-11-25: qty 5

## 2015-11-25 MED ORDER — SODIUM CHLORIDE 0.9 % IV SOLN
Freq: Once | INTRAVENOUS | Status: AC
  Administered 2015-11-25: 14:00:00 via INTRAVENOUS

## 2015-11-25 MED ORDER — PACLITAXEL CHEMO INJECTION 300 MG/50ML: 80.0000 mg/m2 | Freq: Once | INTRAVENOUS | Status: DC

## 2015-11-25 MED ORDER — DIPHENHYDRAMINE HCL 50 MG/ML IJ SOLN
25.0000 mg | Freq: Once | INTRAMUSCULAR | Status: AC
  Administered 2015-11-25: 25 mg via INTRAVENOUS

## 2015-11-25 MED ORDER — SODIUM CHLORIDE 0.9% FLUSH
10.0000 mL | INTRAVENOUS | Status: DC | PRN
  Administered 2015-11-25: 10 mL
  Filled 2015-11-25: qty 10

## 2015-11-25 NOTE — Patient Instructions (Signed)
[place] Discharge Instructions for Patients Receiving Chemotherapy  Today you received the following chemotherapy agent, Taxol.  To help prevent nausea and vomiting after your treatment, we encourage you to take your nausea medication, Compazine, as prescribed.   If you develop nausea and vomiting that is not controlled by your nausea medication, call the clinic.   BELOW ARE SYMPTOMS THAT SHOULD BE REPORTED IMMEDIATELY:  *FEVER GREATER THAN 100.5 F  *CHILLS WITH OR WITHOUT FEVER  NAUSEA AND VOMITING THAT IS NOT CONTROLLED WITH YOUR NAUSEA MEDICATION  *UNUSUAL SHORTNESS OF BREATH  *UNUSUAL BRUISING OR BLEEDING  TENDERNESS IN MOUTH AND THROAT WITH OR WITHOUT PRESENCE OF ULCERS  *URINARY PROBLEMS  *BOWEL PROBLEMS  UNUSUAL RASH Items with * indicate a potential emergency and should be followed up as soon as possible.  Feel free to call the clinic you have any questions or concerns. The clinic phone number is [phone].  Please show the [facility] at check-in to the Emergency Department and triage nurse.    Paclitaxel injection What is this medicine? PACLITAXEL (PAK li TAX el) is a chemotherapy drug. It targets fast dividing cells, like cancer cells, and causes these cells to die. This medicine is used to treat ovarian cancer, breast cancer, and other cancers. This medicine may be used for other purposes; ask your health care provider or pharmacist if you have questions. What should I tell my health care provider before I take this medicine? They need to know if you have any of these conditions: -blood disorders -irregular heartbeat -infection (especially a virus infection such as chickenpox, cold sores, or herpes) -liver disease -previous or ongoing radiation therapy -an unusual or allergic reaction to paclitaxel, alcohol, polyoxyethylated castor oil, other chemotherapy agents, other medicines, foods, dyes, or preservatives -pregnant or trying  to get pregnant -breast-feeding How should I use this medicine? This drug is given as an infusion into a vein. It is administered in a hospital or clinic by a specially trained health care professional. Talk to your pediatrician regarding the use of this medicine in children. Special care may be needed. Overdosage: If you think you have taken too much of this medicine contact a poison control center or emergency room at once. NOTE: This medicine is only for you. Do not share this medicine with others. What if I miss a dose? It is important not to miss your dose. Call your doctor or health care professional if you are unable to keep an appointment. What may interact with this medicine? Do not take this medicine with any of the following medications: -disulfiram -metronidazole This medicine may also interact with the following medications: -cyclosporine -diazepam -ketoconazole -medicines to increase blood counts like filgrastim, pegfilgrastim, sargramostim -other chemotherapy drugs like cisplatin, doxorubicin, epirubicin, etoposide, teniposide, vincristine -quinidine -testosterone -vaccines -verapamil Talk to your doctor or health care professional before taking any of these medicines: -acetaminophen -aspirin -ibuprofen -ketoprofen -naproxen This list may not describe all possible interactions. Give your health care provider a list of all the medicines, herbs, non-prescription drugs, or dietary supplements you use. Also tell them if you smoke, drink alcohol, or use illegal drugs. Some items may interact with your medicine. What should I watch for while using this medicine? Your condition will be monitored carefully while you are receiving this medicine. You will need important blood work done while you are taking this medicine. This drug may make you feel generally unwell. This is not uncommon, as chemotherapy can affect healthy cells as  healthy cells as well as cancer cells. Report any side  effects. Continue your course of treatment even though you feel ill unless your doctor tells you to stop. This medicine can cause serious allergic reactions. To reduce your risk you will need to take other medicine(s) before treatment with this medicine. In some cases, you may be given additional medicines to help with side effects. Follow all directions for their use. Call your doctor or health care professional for advice if you get a fever, chills or sore throat, or other symptoms of a cold or flu. Do not treat yourself. This drug decreases your body's ability to fight infections. Try to avoid being around people who are sick. This medicine may increase your risk to bruise or bleed. Call your doctor or health care professional if you notice any unusual bleeding. Be careful brushing and flossing your teeth or using a toothpick because you may get an infection or bleed more easily. If you have any dental work done, tell your dentist you are receiving this medicine. Avoid taking products that contain aspirin, acetaminophen, ibuprofen, naproxen, or ketoprofen unless instructed by your doctor. These medicines may hide a fever. Do not become pregnant while taking this medicine. Women should inform their doctor if they wish to become pregnant or think they might be pregnant. There is a potential for serious side effects to an unborn child. Talk to your health care professional or pharmacist for more information. Do not breast-feed an infant while taking this medicine. Men are advised not to father a child while receiving this medicine. This product may contain alcohol. Ask your pharmacist or healthcare provider if this medicine contains alcohol. Be sure to tell all healthcare providers you are taking this medicine. Certain medicines, like metronidazole and disulfiram, can cause an unpleasant reaction when taken with alcohol. The reaction includes flushing, headache, nausea, vomiting, sweating, and increased  thirst. The reaction can last from 30 minutes to several hours. What side effects may I notice from receiving this medicine? Side effects that you should report to your doctor or health care professional as soon as possible: -allergic reactions like skin rash, itching or hives, swelling of the face, lips, or tongue -low blood counts - This drug may decrease the number of white blood cells, red blood cells and platelets. You may be at increased risk for infections and bleeding. -signs of infection - fever or chills, cough, sore throat, pain or difficulty passing urine -signs of decreased platelets or bleeding - bruising, pinpoint red spots on the skin, black, tarry stools, nosebleeds -signs of decreased red blood cells - unusually weak or tired, fainting spells, lightheadedness -breathing problems -chest pain -high or low blood pressure -mouth sores -nausea and vomiting -pain, swelling, redness or irritation at the injection site -pain, tingling, numbness in the hands or feet -slow or irregular heartbeat -swelling of the ankle, feet, hands Side effects that usually do not require medical attention (report to your doctor or health care professional if they continue or are bothersome): -bone pain -complete hair loss including hair on your head, underarms, pubic hair, eyebrows, and eyelashes -changes in the color of fingernails -diarrhea -loosening of the fingernails -loss of appetite -muscle or joint pain -red flush to skin -sweating This list may not describe all possible side effects. Call your doctor for medical advice about side effects. You may report side effects to FDA at 1-800-FDA-1088. Where should I keep my medicine? This drug is given in a hospital or clinic and will   at home. NOTE: This sheet is a summary. It may not cover all possible information. If you have questions about this medicine, talk to your doctor, pharmacist, or health care provider.

## 2015-11-25 NOTE — Telephone Encounter (Signed)
appt made and avs will print in treatment room °

## 2015-11-25 NOTE — Progress Notes (Signed)
[place]  Telephone:(336) [phone] Fax:([phone]) [phone]   ID: ADAMARIE IZZO DOB: 1961/04/17  MR#: [id number]  YIR#:[id number]  Patient Care Team: [name], MD as PCP - General (Internal Medicine) Erroll Luna, MD as Consulting Physician (General Surgery) Chauncey Cruel, MD as Consulting Physician (Oncology) [name], MD as Consulting Physician (Radiation Oncology) Aloha Gell, MD as Consulting Physician (Obstetrics and Gynecology) Harriett Sine, MD as Consulting Physician (Dermatology) PCP: [name], MD OTHER MD:  CHIEF COMPLAINT: Estrogen receptor positive breast cancer  CURRENT TREATMENT: Neoadjuvant chemotherapy  BREAST CANCER HISTORY: From the original intake note:  Aara had routine screening mammography with tomography [facility] February 20 11/14/2014 showing a possible mass in the right breast. Diagnostic right mammography with right breast ultrasonography 11/01/2014 found the breast density to be category C. In the upper outer quadrant of the right breast there was a partially obscured mass which on physical exam was palpable as an area of thickening at the 10:00 position 8 cm from the nipple. Ultrasound showed multiple cysts in the upper outer right breast corresponding to the mass in question.  In November 2016 the patient felt a change in the upper left breast and brought it to her primary care physician's attention. Diagnostic left mammography with tomosynthesis and left breast ultrasonography at [facility] December 01/14/2015 found trabecular thickening throughout the left breast with skin thickening, but no circumscribed mass or suspicious calcifications. A rounded mass in the left axilla measured 6 mm. There was an additional mildly prominent lymph node in the left axilla which was what the patient had been palpating. Ultrasonography showed ill-defined swelling throughout the inner left breast with overlying  skin thickening.. At the 2:00 position 4 cm from the nipple there was an irregular hypoechoic mass measuring 2.3 cm. A second mass at the 1:00 area 3 cm from the nipple measured 1 cm. The distance between these 2 masses was 1.8 cm. There was also a round hypoechoic left axillary mass measuring 0.6 cm. Overall the was an area of 3.7 cm of mixed echogenicity corresponding to the area of palpable soft tissue swelling.   On 08/04/2015 the patient underwent biopsy of the mass at the 2:00 axis 4 cm from the nipple and a second biopsy was performed of the hypoechoic mass at the 1:00 position 3 cm from the nipple. Biopsy of the suspicious nodule in the lower left axilla was attempted but could not be completed as the mass became obscured after administration of lidocaine.  The pathology from both left breast biopsies 08/04/2015 ([id number]) showed invasive ductal carcinoma, grade 3. The prognostic profile obtained from the larger tumor showed estrogen receptor to be strongly positive at 95%, progesterone receptor positive at 30%, with an MIB-1 of 12%, and no HER-2 amplification, the signals ratio being 1.22 and the number per cell 2.20.  On 08/09/2015 the patient underwent biopsy of this suspicious left axillary lymph node noted above, and this showed ([id number]) metastatic carcinoma with extracapsular extension.  The patient's subsequent history is as detailed below  INTERVAL HISTORY: Monaye returns for follow up of her estrogen receptor positive breast cancer, accompanied by her husband [name]. Today is day 1, cycle 6 of 12 planned cycles of weekly paclitaxel.  REVIEW OF SYSTEMS: Ethell denies fevers, chills, nausea, or vomiting. She is moving her bowels well. Her appetite is good. She denies mouth sores, rashes, or neuropathy symptoms. She is fatigued for longer each week, and it is frustrating  to her. She is working on becoming more physically active on a daily basis. She is sleeping well on lunesta. A  detailed review of systems is otherwise stable.  PAST MEDICAL HISTORY: Past Medical History  Diagnosis Date  . Hypertension   . Raynaud's disease   . Skin cancer   . Rectovaginal fistula     repaired 2014  . Osteopenia determined by x-ray     PAST SURGICAL HISTORY: Past Surgical History  Procedure Laterality Date  . C-section    . Abdominal hysterectomy      with BSO  . Appendectomy    . Colon resection    . Hernia repair      FAMILY HISTORY Family History  Problem Relation Age of Onset  . Lung cancer Father    the patient's father died from lung cancer at the age of 10 in the setting of tobacco abuse. The patient's mother died at the age of 35 with pancreatic cancer. The patient had no siblings. There is no history of breast or ovarian cancer in the family.  GYNECOLOGIC HISTORY:  No LMP recorded. Patient has had a hysterectomy. Menarche age 55, first live birth age 55. The patient is GX P1. She is status post total hysterectomy with bilateral salpingo-oophorectomy. She has been on hormone replacement since that time and is tapering to off at the time of this dictation.   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 She used to work at [employer]. Her husband [name] works for [employer] division at a [employer] their son [name] is currently at home.    ADVANCED DIRECTIVES: Not in place   HEALTH MAINTENANCE: Social History  Substance Use Topics  . Smoking status: Former Smoker -- 1.00 packs/day    Types: Cigarettes    Quit date: 11/25/2008  . Smokeless tobacco: Not on file  . Alcohol Use: No     Colonoscopy: April 2014/ Raynaldo Opitz at [facility]  PAP: s/p hysterectomy  Bone density: August 2016/ osteopenia  Lipid panel:  Allergies  Allergen Reactions  . Sulfa Antibiotics Other (See Comments)    Blisters in mouth    Current Outpatient Prescriptions  Medication Sig Dispense Refill  . cephALEXin (KEFLEX) 500 MG capsule Take 1 capsule  (500 mg total) by mouth 2 (two) times daily. 14 capsule 0  . cetirizine (ZYRTEC) 10 MG tablet Take 10 mg by mouth daily.    . Cholecalciferol (VITAMIN D3) 2000 UNITS capsule Take 2,000 Units by mouth daily.     . clobetasol cream (TEMOVATE) 9.50 % Apply 1 application topically 2 (two) times daily. (Patient not taking: Reported on 11/04/2015) 30 g 0  . dextroamphetamine (DEXEDRINE SPANSULE) 15 MG 24 hr capsule Take 15 mg by mouth 2 (two) times daily.     Marland Kitchen doxycycline (VIBRA-TABS) 100 MG tablet Take 1 tablet (100 mg total) by mouth daily. 30 tablet 1  . eszopiclone (LUNESTA) 2 MG TABS tablet Take 1 tablet (2 mg total) by mouth at bedtime. Take immediately before bedtime 30 tablet 0  . Fenofibric Acid 105 MG TABS Take 1 tablet by mouth daily.    . fluconazole (DIFLUCAN) 100 MG tablet Take 1 tablet (100 mg total) by mouth daily. (Patient not taking: Reported on 11/18/2015) 30 tablet 0  . fluticasone (FLONASE) 50 MCG/ACT nasal spray Place 2 sprays into both nostrils daily. Reported on 11/18/2015    . gabapentin (NEURONTIN) 300 MG capsule Take 300 mg by mouth 3 (three) times daily.     Marland Kitchen  Icosapent Ethyl (VASCEPA) 1 G CAPS Take 2 capsules by mouth 2 (two) times daily.    Marland Kitchen LORazepam (ATIVAN) 0.5 MG tablet Take 1 tablet (0.5 mg total) by mouth at bedtime. (Patient not taking: Reported on 11/11/2015) 30 tablet 0  . Multiple Vitamin (MULTIVITAMIN) tablet Take 1 tablet by mouth daily.    Marland Kitchen NIFEdipine (PROCARDIA-XL/ADALAT-CC/NIFEDICAL-XL) 30 MG 24 hr tablet Take 30 mg by mouth daily.     Marland Kitchen olmesartan (BENICAR) 20 MG tablet Take 1 tablet (20 mg total) by mouth daily. 90 tablet 4  . omeprazole (PRILOSEC) 40 MG capsule Take 1 capsule (40 mg total) by mouth daily. 30 capsule 2  . ondansetron (ZOFRAN) 8 MG tablet Take 1 tablet (8 mg total) by mouth every 8 (eight) hours as needed for nausea or vomiting. (Patient not taking: Reported on 11/11/2015) 30 tablet 0  . polyethylene glycol (MIRALAX / GLYCOLAX) packet Take 17 g  by mouth daily. Reported on 11/18/2015    . Probiotic Product (PROBIOTIC DAILY PO) Take 1 capsule by mouth daily.     . prochlorperazine (COMPAZINE) 10 MG tablet Take 1 tablet (10 mg total) by mouth every 6 (six) hours as needed (Nausea or vomiting). 30 tablet 1  . valACYclovir (VALTREX) 500 MG tablet Take 1 tablet (500 mg total) by mouth 2 (two) times daily. 30 tablet 1   No current facility-administered medications for this visit.    OBJECTIVE: Middle-aged white woman in no acute distress Filed Vitals:   11/25/15 1309  BP: 107/61  Pulse: 92  Temp: 97.9 F (36.6 C)  Resp: 18     Body mass index is 27.62 kg/(m^2).    ECOG FS:1 - Symptomatic but completely ambulatory  Skin: warm, dry  HEENT: sclerae anicteric, conjunctivae pink, oropharynx clear. No thrush or mucositis.  Lymph Nodes: No cervical or supraclavicular lymphadenopathy  Lungs: clear to auscultation bilaterally, no rales, wheezes, or rhonci  Heart: regular rate and rhythm  Abdomen: round, soft, non tender, positive bowel sounds  Musculoskeletal: No focal spinal tenderness, no peripheral edema  Neuro: non focal, well oriented, positive affect  Breasts: deferred  LAB RESULTS:  CMP     Component Value Date/Time   NA 139 11/25/2015 1224   NA 138 11/12/2015 0930   K 4.6 11/25/2015 1224   K 4.3 11/12/2015 0930   CL 108 11/12/2015 0930   CO2 24 11/25/2015 1224   CO2 22 11/12/2015 0930   GLUCOSE 97 11/25/2015 1224   GLUCOSE 97 11/12/2015 0930   BUN 12.5 11/25/2015 1224   BUN 10 11/12/2015 0930   CREATININE 0.8 11/25/2015 1224   CREATININE 0.58 11/12/2015 0930   CALCIUM 9.7 11/25/2015 1224   CALCIUM 9.3 11/12/2015 0930   PROT 6.6 11/25/2015 1224   PROT 7.1 11/12/2015 0930   ALBUMIN 3.7 11/25/2015 1224   ALBUMIN 4.0 11/12/2015 0930   AST 22 11/25/2015 1224   AST 23 11/12/2015 0930   ALT 17 11/25/2015 1224   ALT 18 11/12/2015 0930   ALKPHOS 39* 11/25/2015 1224   ALKPHOS 37* 11/12/2015 0930   BILITOT <0.30  11/25/2015 1224   BILITOT 0.8 11/12/2015 0930   GFRNONAA >60 11/12/2015 0930   GFRAA >60 11/12/2015 0930    INo results found for: SPEP, UPEP  Lab Results  Component Value Date   WBC 5.2 11/25/2015   NEUTROABS 3.1 11/25/2015   HGB 9.6* 11/25/2015   HCT 29.1* 11/25/2015   MCV 100.3 11/25/2015   PLT 278 11/25/2015  Chemistry      Component Value Date/Time   NA 139 11/25/2015 1224   NA 138 11/12/2015 0930   K 4.6 11/25/2015 1224   K 4.3 11/12/2015 0930   CL 108 11/12/2015 0930   CO2 24 11/25/2015 1224   CO2 22 11/12/2015 0930   BUN 12.5 11/25/2015 1224   BUN 10 11/12/2015 0930   CREATININE 0.8 11/25/2015 1224   CREATININE 0.58 11/12/2015 0930      Component Value Date/Time   CALCIUM 9.7 11/25/2015 1224   CALCIUM 9.3 11/12/2015 0930   ALKPHOS 39* 11/25/2015 1224   ALKPHOS 37* 11/12/2015 0930   AST 22 11/25/2015 1224   AST 23 11/12/2015 0930   ALT 17 11/25/2015 1224   ALT 18 11/12/2015 0930   BILITOT <0.30 11/25/2015 1224   BILITOT 0.8 11/12/2015 0930       No results found for: LABCA2  No components found for: LABCA125  No results for input(s): INR in the last 168 hours.  Urinalysis    Component Value Date/Time   COLORURINE YELLOW 11/12/2015 [place] 11/12/2015 0837   LABSPEC 1.006 11/12/2015 0837   PHURINE 6.5 11/12/2015 0837   GLUCOSEU NEGATIVE 11/12/2015 0837   HGBUR MODERATE* 11/12/2015 0837   [place] 11/12/2015 0837   Sun 11/12/2015 0837   PROTEINUR NEGATIVE 11/12/2015 0837   NITRITE NEGATIVE 11/12/2015 0837   LEUKOCYTESUR MODERATE* 11/12/2015 0837    STUDIES: No results found.  ASSESSMENT: 55 y.o. [place] woman status post 2 separate masses in the left breast 08/04/2015, clinically mT2 N1, stage IIb/IIIa invasive ductal carcinoma, grade 3, the larger mass being estrogen and progesterone receptor positive, HER-2 negative, with an MIB-1 of 12%  (a) biopsy of a left axillary lymph node  08/09/2015 was positive, with extracapsular extension  (1) neoadjuvant chemotherapy started 08/25/2015 consisting of doxorubicin and cyclophosphamide in dose dense fashion 4, completed 10/06/2015,  followed by weekly paclitaxel 12  (2) definitive surgery to follow  (3) adjuvant radiation to follow surgery  (4) anti-estrogens to follow completion of local treatment  (a) bone density August 2016 showed osteopenia  (b) the patient is status post total abdominal hysterectomy with bilateral salpingo-oophorectomy.  (5) the patient appears to be a good candidate for the Alliance trial  PLAN: Allisen's fatigue is building but she has no complaints otherwise. The labs were reviewed in detail and were stable. Her treatment related anemia is actually improving. She will proceed with cycle 6 of paclitaxel as planned today.  Batsheva will return in 1 week for cycle 7 of treatment. She understands and agrees with this plan. She knows the goal of treatment in her case is cure. She has been encouraged to call with any issues that might arise before her next visit here.   Laurie Panda, NP   11/25/2015 1:31 PM

## 2015-11-28 ENCOUNTER — Other Ambulatory Visit: Payer: Self-pay | Admitting: Nurse Practitioner

## 2015-11-29 ENCOUNTER — Other Ambulatory Visit: Payer: Self-pay | Admitting: General Surgery

## 2015-11-29 DIAGNOSIS — C50412 Malignant neoplasm of upper-outer quadrant of left female breast: Secondary | ICD-10-CM

## 2015-12-02 ENCOUNTER — Encounter: Payer: Self-pay | Admitting: *Deleted

## 2015-12-02 ENCOUNTER — Ambulatory Visit (HOSPITAL_BASED_OUTPATIENT_CLINIC_OR_DEPARTMENT_OTHER): Payer: 59 | Admitting: Nurse Practitioner

## 2015-12-02 ENCOUNTER — Other Ambulatory Visit (HOSPITAL_BASED_OUTPATIENT_CLINIC_OR_DEPARTMENT_OTHER): Payer: 59

## 2015-12-02 ENCOUNTER — Encounter: Payer: Self-pay | Admitting: Nurse Practitioner

## 2015-12-02 ENCOUNTER — Ambulatory Visit (HOSPITAL_BASED_OUTPATIENT_CLINIC_OR_DEPARTMENT_OTHER): Payer: 59

## 2015-12-02 VITALS — BP 104/54 | HR 107 | Temp 97.7°F | Resp 18 | Ht 63.0 in | Wt 156.0 lb

## 2015-12-02 DIAGNOSIS — C50412 Malignant neoplasm of upper-outer quadrant of left female breast: Secondary | ICD-10-CM

## 2015-12-02 DIAGNOSIS — R530 Neoplastic (malignant) related fatigue: Secondary | ICD-10-CM

## 2015-12-02 DIAGNOSIS — T451X5A Adverse effect of antineoplastic and immunosuppressive drugs, initial encounter: Secondary | ICD-10-CM

## 2015-12-02 DIAGNOSIS — Z5111 Encounter for antineoplastic chemotherapy: Secondary | ICD-10-CM | POA: Diagnosis not present

## 2015-12-02 DIAGNOSIS — D6481 Anemia due to antineoplastic chemotherapy: Secondary | ICD-10-CM

## 2015-12-02 DIAGNOSIS — G4700 Insomnia, unspecified: Secondary | ICD-10-CM

## 2015-12-02 LAB — COMPREHENSIVE METABOLIC PANEL
ALBUMIN: 3.7 g/dL (ref 3.5–5.0)
ALK PHOS: 40 U/L (ref 40–150)
ALT: 16 U/L (ref 0–55)
ANION GAP: 8 meq/L (ref 3–11)
AST: 20 U/L (ref 5–34)
BUN: 17.1 mg/dL (ref 7.0–26.0)
CALCIUM: 9.7 mg/dL (ref 8.4–10.4)
CHLORIDE: 108 meq/L (ref 98–109)
CO2: 22 mEq/L (ref 22–29)
CREATININE: 1 mg/dL (ref 0.6–1.1)
EGFR: 64 mL/min/{1.73_m2} — ABNORMAL LOW (ref >90)
Glucose: 105 mg/dl (ref 70–140)
POTASSIUM: 4.6 meq/L (ref 3.5–5.1)
Sodium: 138 mEq/L (ref 136–145)
Total Bilirubin: <0.3 mg/dL (ref 0.20–1.20)
Total Protein: 6.7 g/dL (ref 6.4–8.3)

## 2015-12-02 LAB — CBC WITH DIFFERENTIAL/PLATELET
BASO%: 2.5 % — ABNORMAL HIGH (ref 0.0–2.0)
BASOS ABS: 0.1 10*3/uL (ref 0.0–0.1)
EOS ABS: 0.1 10*3/uL (ref 0.0–0.5)
EOS%: 1.9 % (ref 0.0–7.0)
HEMATOCRIT: 31.7 % — AB (ref 34.8–46.6)
HEMOGLOBIN: 10.6 g/dL — AB (ref 11.6–15.9)
LYMPH#: 1.2 10*3/uL (ref 0.9–3.3)
LYMPH%: 22.9 % (ref 14.0–49.7)
MCH: 33.6 pg (ref 25.1–34.0)
MCHC: 33.3 g/dL (ref 31.5–36.0)
MCV: 100.9 fL (ref 79.5–101.0)
MONO#: 0.6 10*3/uL (ref 0.1–0.9)
MONO%: 10.7 % (ref 0.0–14.0)
NEUT#: 3.4 10*3/uL (ref 1.5–6.5)
NEUT%: 62 % (ref 38.4–76.8)
PLATELETS: 357 10*3/uL (ref 145–400)
RBC: 3.15 10*6/uL — ABNORMAL LOW (ref 3.70–5.45)
RDW: 17.2 % — AB (ref 11.2–14.5)
WBC: 5.4 10*3/uL (ref 3.9–10.3)

## 2015-12-02 MED ORDER — DIPHENHYDRAMINE HCL 50 MG/ML IJ SOLN
25.0000 mg | Freq: Once | INTRAMUSCULAR | Status: AC
  Administered 2015-12-02: 0.25 mg via INTRAVENOUS

## 2015-12-02 MED ORDER — SODIUM CHLORIDE 0.9 % IV SOLN
Freq: Once | INTRAVENOUS | Status: AC
  Administered 2015-12-02: 12:00:00 via INTRAVENOUS

## 2015-12-02 MED ORDER — SODIUM CHLORIDE 0.9 % IV SOLN
4.0000 mg | Freq: Once | INTRAVENOUS | Status: AC
  Administered 2015-12-02: 4 mg via INTRAVENOUS
  Filled 2015-12-02: qty 0.4

## 2015-12-02 MED ORDER — LORAZEPAM 0.5 MG PO TABS: 0.5000 mg | ORAL_TABLET | Freq: Every day | ORAL | Status: DC

## 2015-12-02 MED ORDER — FAMOTIDINE IN NACL 20-0.9 MG/50ML-% IV SOLN
20.0000 mg | Freq: Once | INTRAVENOUS | Status: AC
  Administered 2015-12-02: 20 mg via INTRAVENOUS

## 2015-12-02 MED ORDER — HEPARIN SOD (PORK) LOCK FLUSH 100 UNIT/ML IV SOLN
500.0000 [IU] | Freq: Once | INTRAVENOUS | Status: AC | PRN
  Administered 2015-12-02: 500 [IU]
  Filled 2015-12-02: qty 5

## 2015-12-02 MED ORDER — FAMOTIDINE IN NACL 20-0.9 MG/50ML-% IV SOLN
INTRAVENOUS | Status: AC
  Filled 2015-12-02: qty 50

## 2015-12-02 MED ORDER — DIPHENHYDRAMINE HCL 50 MG/ML IJ SOLN
INTRAMUSCULAR | Status: AC
  Filled 2015-12-02: qty 1

## 2015-12-02 MED ORDER — SODIUM CHLORIDE 0.9% FLUSH
10.0000 mL | INTRAVENOUS | Status: DC | PRN
  Administered 2015-12-02: 10 mL
  Filled 2015-12-02: qty 10

## 2015-12-02 MED ORDER — SODIUM CHLORIDE 0.9 % IV SOLN
80.0000 mg/m2 | Freq: Once | INTRAVENOUS | Status: AC
  Administered 2015-12-02: 138 mg via INTRAVENOUS
  Filled 2015-12-02: qty 23

## 2015-12-02 NOTE — Progress Notes (Signed)
[place]  [phone] [phone]   ID: Alison [name] DOB: 04-Nov-1960  MR#: [id number]  ZYY#:[id number]  Patient Care Team: [name], MD as PCP - General (Internal Medicine) Erroll Luna, MD as Consulting Physician (General Surgery) Chauncey Cruel, MD as Consulting Physician (Oncology) [name], MD as Consulting Physician (Radiation Oncology) Aloha Gell, MD as Consulting Physician (Obstetrics and Gynecology) Harriett Sine, MD as Consulting Physician (Dermatology) PCP: [name], MD OTHER MD:  CHIEF COMPLAINT: Estrogen receptor positive breast cancer  CURRENT TREATMENT: Neoadjuvant chemotherapy  BREAST CANCER HISTORY: From the original intake note:  Alison [name] had routine screening mammography with tomography [facility] February 20 11/14/2014 showing a possible mass in the right breast. Diagnostic right mammography with right breast ultrasonography 11/01/2014 found the breast density to be category C. In the upper outer quadrant of the right breast there was a partially obscured mass which on physical exam was palpable as an area of thickening at the 10:00 position 8 cm from the nipple. Ultrasound showed multiple cysts in the upper outer right breast corresponding to the mass in question.  In November 2016 the patient felt a change in the upper left breast and brought it to her primary care physician's attention. Diagnostic left mammography with tomosynthesis and left breast ultrasonography at [facility] December 01/14/2015 found trabecular thickening throughout the left breast with skin thickening, but no circumscribed mass or suspicious calcifications. A rounded mass in the left axilla measured 6 mm. There was an additional mildly prominent lymph node in the left axilla which was what the patient had been palpating. Ultrasonography showed ill-defined swelling throughout the inner left breast with overlying  skin thickening.. At the 2:00 position 4 cm from the nipple there was an irregular hypoechoic mass measuring 2.3 cm. A second mass at the 1:00 area 3 cm from the nipple measured 1 cm. The distance between these 2 masses was 1.8 cm. There was also a round hypoechoic left axillary mass measuring 0.6 cm. Overall the was an area of 3.7 cm of mixed echogenicity corresponding to the area of palpable soft tissue swelling.   On 08/04/2015 the patient underwent biopsy of the mass at the 2:00 axis 4 cm from the nipple and a second biopsy was performed of the hypoechoic mass at the 1:00 position 3 cm from the nipple. Biopsy of the suspicious nodule in the lower left axilla was attempted but could not be completed as the mass became obscured after administration of lidocaine.  The pathology from both left breast biopsies 08/04/2015 ([id number]) showed invasive ductal carcinoma, grade 3. The prognostic profile obtained from the larger tumor showed estrogen receptor to be strongly positive at 95%, progesterone receptor positive at 30%, with an MIB-1 of 12%, and no HER-2 amplification, the signals ratio being 1.22 and the number per cell 2.20.  On 08/09/2015 the patient underwent biopsy of this suspicious left axillary lymph node noted above, and this showed ([id number]) metastatic carcinoma with extracapsular extension.  The patient's subsequent history is as detailed below  INTERVAL HISTORY: Alison [name] returns for follow up of her estrogen receptor positive breast cancer, accompanied by her husband [name]. Today is day 1, cycle 7 of 12 planned cycles of weekly paclitaxel.  REVIEW OF SYSTEMS: Alison [name]'s main complaint today is fatigue. She is getting better sleep however. She realized unfortunately that she continue to take 2 lunesta tablets, and has run out. She denies fevers, chills, nausea, or vomiting. She is  moving her bowels well. Her appetite is good. She denies mouth sores, rashes, or neuropathy symptoms. A  detailed review of systems is otherwise stable.  PAST MEDICAL HISTORY: Past Medical History  Diagnosis Date  . Hypertension   . Raynaud's disease   . Skin cancer   . Rectovaginal fistula     repaired 2014  . Osteopenia determined by x-ray     PAST SURGICAL HISTORY: Past Surgical History  Procedure Laterality Date  . C-section    . Abdominal hysterectomy      with BSO  . Appendectomy    . Colon resection    . Hernia repair      FAMILY HISTORY Family History  Problem Relation Age of Onset  . Lung cancer Father    the patient's father died from lung cancer at the age of 63 in the setting of tobacco abuse. The patient's mother died at the age of 68 with pancreatic cancer. The patient had no siblings. There is no history of breast or ovarian cancer in the family.  GYNECOLOGIC HISTORY:  No LMP recorded. Patient has had a hysterectomy. Menarche age 30, first live birth age 38. The patient is GX P1. She is status post total hysterectomy with bilateral salpingo-oophorectomy. She has been on hormone replacement since that time and is tapering to off at the time of this dictation.   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 She used to work at [employer]. Her husband Alison [name] works for [employer] division at a [employer] their son Alison [name] is currently at home.    ADVANCED DIRECTIVES: Not in place   HEALTH MAINTENANCE: Social History  Substance Use Topics  . Smoking status: Former Smoker -- 1.00 packs/day    Types: Cigarettes    Quit date: 11/25/2008  . Smokeless tobacco: Not on file  . Alcohol Use: No     Colonoscopy: April 2014/ Raynaldo Opitz at [facility]  PAP: s/p hysterectomy  Bone density: August 2016/ osteopenia  Lipid panel:  Allergies  Allergen Reactions  . Sulfa Antibiotics Other (See Comments)    Blisters in mouth    Current Outpatient Prescriptions  Medication Sig Dispense Refill  . cetirizine (ZYRTEC) 10 MG tablet Take 10 mg by mouth  daily.    . Cholecalciferol (VITAMIN D3) 2000 UNITS capsule Take 2,000 Units by mouth daily.     Marland Kitchen dextroamphetamine (DEXEDRINE SPANSULE) 15 MG 24 hr capsule Take 15 mg by mouth 2 (two) times daily.     . eszopiclone (LUNESTA) 2 MG TABS tablet Take 1 tablet (2 mg total) by mouth at bedtime. Take immediately before bedtime 30 tablet 0  . Fenofibric Acid 105 MG TABS Take 1 tablet by mouth daily.    . fluconazole (DIFLUCAN) 100 MG tablet TAKE ONE TABLET BY MOUTH ONCE DAILY 30 tablet 1  . fluticasone (FLONASE) 50 MCG/ACT nasal spray Place 2 sprays into both nostrils daily. Reported on 11/18/2015    . gabapentin (NEURONTIN) 300 MG capsule Take 300 mg by mouth 3 (three) times daily.     Vanessa Kick Ethyl (VASCEPA) 1 G CAPS Take 2 capsules by mouth 2 (two) times daily.    . Multiple Vitamin (MULTIVITAMIN) tablet Take 1 tablet by mouth daily.    Marland Kitchen NIFEdipine (PROCARDIA-XL/ADALAT-CC/NIFEDICAL-XL) 30 MG 24 hr tablet Take 30 mg by mouth daily.     Marland Kitchen olmesartan (BENICAR) 20 MG tablet Take 1 tablet (20 mg total) by mouth daily. 90 tablet 4  . omeprazole (PRILOSEC) 40  MG capsule TAKE ONE CAPSULE EACH DAY 30 capsule 2  . polyethylene glycol (MIRALAX / GLYCOLAX) packet Take 17 g by mouth daily. Reported on 11/18/2015    . Probiotic Product (PROBIOTIC DAILY PO) Take 1 capsule by mouth daily.     . prochlorperazine (COMPAZINE) 10 MG tablet Take 1 tablet (10 mg total) by mouth every 6 (six) hours as needed (Nausea or vomiting). 30 tablet 1  . valACYclovir (VALTREX) 500 MG tablet Take 1 tablet (500 mg total) by mouth 2 (two) times daily. 30 tablet 1  . clobetasol cream (TEMOVATE) 2.83 % Apply 1 application topically 2 (two) times daily. (Patient not taking: Reported on 11/04/2015) 30 g 0  . doxycycline (VIBRA-TABS) 100 MG tablet Take 1 tablet (100 mg total) by mouth daily. (Patient not taking: Reported on 12/02/2015) 30 tablet 1  . ondansetron (ZOFRAN) 8 MG tablet Take 1 tablet (8 mg total) by mouth every 8 (eight) hours  as needed for nausea or vomiting. (Patient not taking: Reported on 11/11/2015) 30 tablet 0   No current facility-administered medications for this visit.    OBJECTIVE: Middle-aged white woman in no acute distress Filed Vitals:   12/02/15 1053  BP: 104/54  Pulse: 107  Temp: 97.7 F (36.5 C)  Resp: 18     Body mass index is 27.64 kg/(m^2).    ECOG FS:1 - Symptomatic but completely ambulatory  Sclerae unicteric, pupils round and equal Oropharynx clear and moist-- no thrush or other lesions No cervical or supraclavicular adenopathy Lungs no rales or rhonchi Heart regular rate and rhythm Abd soft, nontender, positive bowel sounds MSK no focal spinal tenderness, no upper extremity lymphedema Neuro: nonfocal, well oriented, appropriate affect Breasts: deferred  LAB RESULTS:  CMP     Component Value Date/Time   NA 138 12/02/2015 1041   NA 138 11/12/2015 0930   K 4.6 12/02/2015 1041   K 4.3 11/12/2015 0930   CL 108 11/12/2015 0930   CO2 22 12/02/2015 1041   CO2 22 11/12/2015 0930   GLUCOSE 105 12/02/2015 1041   GLUCOSE 97 11/12/2015 0930   BUN 17.1 12/02/2015 1041   BUN 10 11/12/2015 0930   CREATININE 1.0 12/02/2015 1041   CREATININE 0.58 11/12/2015 0930   CALCIUM 9.7 12/02/2015 1041   CALCIUM 9.3 11/12/2015 0930   PROT 6.7 12/02/2015 1041   PROT 7.1 11/12/2015 0930   ALBUMIN 3.7 12/02/2015 1041   ALBUMIN 4.0 11/12/2015 0930   AST 20 12/02/2015 1041   AST 23 11/12/2015 0930   ALT 16 12/02/2015 1041   ALT 18 11/12/2015 0930   ALKPHOS 40 12/02/2015 1041   ALKPHOS 37* 11/12/2015 0930   BILITOT <0.30 12/02/2015 1041   BILITOT 0.8 11/12/2015 0930   GFRNONAA >60 11/12/2015 0930   GFRAA >60 11/12/2015 0930    INo results found for: SPEP, UPEP  Lab Results  Component Value Date   WBC 5.4 12/02/2015   NEUTROABS 3.4 12/02/2015   HGB 10.6* 12/02/2015   HCT 31.7* 12/02/2015   MCV 100.9 12/02/2015   PLT 357 12/02/2015      Chemistry      Component Value Date/Time     NA 138 12/02/2015 1041   NA 138 11/12/2015 0930   K 4.6 12/02/2015 1041   K 4.3 11/12/2015 0930   CL 108 11/12/2015 0930   CO2 22 12/02/2015 1041   CO2 22 11/12/2015 0930   BUN 17.1 12/02/2015 1041   BUN 10 11/12/2015 0930   CREATININE 1.0 12/02/2015  1041   CREATININE 0.58 11/12/2015 0930      Component Value Date/Time   CALCIUM 9.7 12/02/2015 1041   CALCIUM 9.3 11/12/2015 0930   ALKPHOS 40 12/02/2015 1041   ALKPHOS 37* 11/12/2015 0930   AST 20 12/02/2015 1041   AST 23 11/12/2015 0930   ALT 16 12/02/2015 1041   ALT 18 11/12/2015 0930   BILITOT <0.30 12/02/2015 1041   BILITOT 0.8 11/12/2015 0930       No results found for: LABCA2  No components found for: LABCA125  No results for input(s): INR in the last 168 hours.  Urinalysis    Component Value Date/Time   COLORURINE YELLOW 11/12/2015 [place] 11/12/2015 0837   LABSPEC 1.006 11/12/2015 0837   PHURINE 6.5 11/12/2015 0837   GLUCOSEU NEGATIVE 11/12/2015 0837   HGBUR MODERATE* 11/12/2015 0837   [place] 11/12/2015 0837   [place] 11/12/2015 0837   PROTEINUR NEGATIVE 11/12/2015 0837   NITRITE NEGATIVE 11/12/2015 0837   LEUKOCYTESUR MODERATE* 11/12/2015 0837    STUDIES: No results found.  ASSESSMENT: 55 y.o. [place] woman status post 2 separate masses in the left breast 08/04/2015, clinically mT2 N1, stage IIb/IIIa invasive ductal carcinoma, grade 3, the larger mass being estrogen and progesterone receptor positive, HER-2 negative, with an MIB-1 of 12%  (a) biopsy of a left axillary lymph node 08/09/2015 was positive, with extracapsular extension  (1) neoadjuvant chemotherapy started 08/25/2015 consisting of doxorubicin and cyclophosphamide in dose dense fashion 4, completed 10/06/2015,  followed by weekly paclitaxel 12  (2) definitive surgery to follow  (3) adjuvant radiation to follow surgery  (4) anti-estrogens to follow completion of local treatment  (a) bone  density August 2016 showed osteopenia  (b) the patient is status post total abdominal hysterectomy with bilateral salpingo-oophorectomy.  (5) the patient appears to be a good candidate for the [facility] trial  PLAN: Cesiah's fatigue is starting to wear on her. She thought her counts might be low, but they have actually only improved several weeks in a row now. Her hgb is 10.6. We will proceed with cycle 7 of paclitaxel as planned today.   I am not able to refill her lunesta. She will have to make due with ativan QHS until the refill date is closer. I have stressed for the second time now that she is to take her meds only as prescribed. Doubling of the prescribed dose of lunesta is not only over the maximum recommended dose, but it causes her to run out of her prescription early like it has today.  Deshanti will return in 1 week for cycle 8 of treatment. She understands and agrees with this plan. She knows the goal of treatment in her case is cure. She has been encouraged to call with any issues that might arise before her next visit here.    Laurie Panda, NP   12/02/2015 11:30 AM

## 2015-12-02 NOTE — Patient Instructions (Signed)
[facility] Discharge Instructions for Patients Receiving Chemotherapy  Today you received the following chemotherapy agents Taxol   To help prevent nausea and vomiting after your treatment, we encourage you to take your nausea medication as directed.   If you develop nausea and vomiting that is not controlled by your nausea medication, call the clinic.   BELOW ARE SYMPTOMS THAT SHOULD BE REPORTED IMMEDIATELY:  *FEVER GREATER THAN 100.5 F  *CHILLS WITH OR WITHOUT FEVER  NAUSEA AND VOMITING THAT IS NOT CONTROLLED WITH YOUR NAUSEA MEDICATION  *UNUSUAL SHORTNESS OF BREATH  *UNUSUAL BRUISING OR BLEEDING  TENDERNESS IN MOUTH AND THROAT WITH OR WITHOUT PRESENCE OF ULCERS  *URINARY PROBLEMS  *BOWEL PROBLEMS  UNUSUAL RASH Items with * indicate a potential emergency and should be followed up as soon as possible.  Feel free to call the clinic you have any questions or concerns. The clinic phone number is [phone].  Please show the CHEMO ALERT CARD at check-in to the Emergency Department and triage nurse.   

## 2015-12-09 ENCOUNTER — Other Ambulatory Visit: Payer: Self-pay | Admitting: Oncology

## 2015-12-09 ENCOUNTER — Encounter: Payer: Self-pay | Admitting: Nurse Practitioner

## 2015-12-09 ENCOUNTER — Other Ambulatory Visit (HOSPITAL_BASED_OUTPATIENT_CLINIC_OR_DEPARTMENT_OTHER): Payer: 59

## 2015-12-09 ENCOUNTER — Ambulatory Visit (HOSPITAL_BASED_OUTPATIENT_CLINIC_OR_DEPARTMENT_OTHER): Payer: 59

## 2015-12-09 ENCOUNTER — Ambulatory Visit (HOSPITAL_BASED_OUTPATIENT_CLINIC_OR_DEPARTMENT_OTHER): Payer: 59 | Admitting: Nurse Practitioner

## 2015-12-09 ENCOUNTER — Telehealth: Payer: Self-pay | Admitting: Nurse Practitioner

## 2015-12-09 ENCOUNTER — Encounter: Payer: Self-pay | Admitting: *Deleted

## 2015-12-09 ENCOUNTER — Other Ambulatory Visit: Payer: Self-pay | Admitting: *Deleted

## 2015-12-09 VITALS — BP 131/72 | HR 105 | Temp 97.4°F | Resp 18 | Ht 63.0 in | Wt 158.1 lb

## 2015-12-09 DIAGNOSIS — C50412 Malignant neoplasm of upper-outer quadrant of left female breast: Secondary | ICD-10-CM

## 2015-12-09 DIAGNOSIS — R110 Nausea: Secondary | ICD-10-CM | POA: Diagnosis not present

## 2015-12-09 DIAGNOSIS — Z5111 Encounter for antineoplastic chemotherapy: Secondary | ICD-10-CM

## 2015-12-09 DIAGNOSIS — T451X5A Adverse effect of antineoplastic and immunosuppressive drugs, initial encounter: Secondary | ICD-10-CM

## 2015-12-09 LAB — CBC WITH DIFFERENTIAL/PLATELET
BASO%: 2 % (ref 0.0–2.0)
Basophils Absolute: 0.1 10*3/uL (ref 0.0–0.1)
EOS%: 1.8 % (ref 0.0–7.0)
Eosinophils Absolute: 0.1 10*3/uL (ref 0.0–0.5)
HCT: 30.6 % — ABNORMAL LOW (ref 34.8–46.6)
HGB: 10.3 g/dL — ABNORMAL LOW (ref 11.6–15.9)
LYMPH#: 1.2 10*3/uL (ref 0.9–3.3)
LYMPH%: 22.7 % (ref 14.0–49.7)
MCH: 34 pg (ref 25.1–34.0)
MCHC: 33.7 g/dL (ref 31.5–36.0)
MCV: 100.7 fL (ref 79.5–101.0)
MONO#: 0.6 10*3/uL (ref 0.1–0.9)
MONO%: 10.7 % (ref 0.0–14.0)
NEUT%: 62.8 % (ref 38.4–76.8)
NEUTROS ABS: 3.3 10*3/uL (ref 1.5–6.5)
PLATELETS: 302 10*3/uL (ref 145–400)
RBC: 3.04 10*6/uL — AB (ref 3.70–5.45)
RDW: 16.1 % — AB (ref 11.2–14.5)
WBC: 5.3 10*3/uL (ref 3.9–10.3)

## 2015-12-09 LAB — COMPREHENSIVE METABOLIC PANEL
ALT: 16 U/L (ref 0–55)
AST: 21 U/L (ref 5–34)
Albumin: 3.5 g/dL (ref 3.5–5.0)
Alkaline Phosphatase: 38 U/L — ABNORMAL LOW (ref 40–150)
Anion Gap: 7 mEq/L (ref 3–11)
BUN: 10.5 mg/dL (ref 7.0–26.0)
CHLORIDE: 108 meq/L (ref 98–109)
CO2: 21 meq/L — AB (ref 22–29)
CREATININE: 0.8 mg/dL (ref 0.6–1.1)
Calcium: 9.4 mg/dL (ref 8.4–10.4)
EGFR: 89 mL/min/{1.73_m2} — ABNORMAL LOW (ref >90)
GLUCOSE: 126 mg/dL (ref 70–140)
Potassium: 4.5 mEq/L (ref 3.5–5.1)
SODIUM: 137 meq/L (ref 136–145)
TOTAL PROTEIN: 6.3 g/dL — AB (ref 6.4–8.3)

## 2015-12-09 MED ORDER — DIPHENHYDRAMINE HCL 50 MG/ML IJ SOLN
25.0000 mg | Freq: Once | INTRAMUSCULAR | Status: AC
  Administered 2015-12-09: 25 mg via INTRAVENOUS

## 2015-12-09 MED ORDER — FAMOTIDINE IN NACL 20-0.9 MG/50ML-% IV SOLN
INTRAVENOUS | Status: AC
  Filled 2015-12-09: qty 50

## 2015-12-09 MED ORDER — SODIUM CHLORIDE 0.9 % IV SOLN
10.0000 mg | Freq: Once | INTRAVENOUS | Status: AC
  Administered 2015-12-09: 10 mg via INTRAVENOUS
  Filled 2015-12-09: qty 1

## 2015-12-09 MED ORDER — DIPHENHYDRAMINE HCL 50 MG/ML IJ SOLN
INTRAMUSCULAR | Status: AC
  Filled 2015-12-09: qty 1

## 2015-12-09 MED ORDER — SODIUM CHLORIDE 0.9 % IV SOLN
80.0000 mg/m2 | Freq: Once | INTRAVENOUS | Status: AC
  Administered 2015-12-09: 138 mg via INTRAVENOUS
  Filled 2015-12-09: qty 23

## 2015-12-09 MED ORDER — HEPARIN SOD (PORK) LOCK FLUSH 100 UNIT/ML IV SOLN
500.0000 [IU] | Freq: Once | INTRAVENOUS | Status: AC | PRN
  Administered 2015-12-09: 500 [IU]
  Filled 2015-12-09: qty 5

## 2015-12-09 MED ORDER — FAMOTIDINE IN NACL 20-0.9 MG/50ML-% IV SOLN
20.0000 mg | Freq: Once | INTRAVENOUS | Status: AC
  Administered 2015-12-09: 20 mg via INTRAVENOUS

## 2015-12-09 MED ORDER — SODIUM CHLORIDE 0.9 % IV SOLN
Freq: Once | INTRAVENOUS | Status: AC
  Administered 2015-12-09: 13:00:00 via INTRAVENOUS

## 2015-12-09 MED ORDER — PROCHLORPERAZINE MALEATE 10 MG PO TABS
ORAL_TABLET | ORAL | Status: AC
  Filled 2015-12-09: qty 1

## 2015-12-09 MED ORDER — ESZOPICLONE 2 MG PO TABS: 2.0000 mg | ORAL_TABLET | Freq: Every day | ORAL | Status: DC

## 2015-12-09 MED ORDER — PROCHLORPERAZINE MALEATE 10 MG PO TABS
10.0000 mg | ORAL_TABLET | Freq: Four times a day (QID) | ORAL | Status: DC | PRN
  Administered 2015-12-09: 10 mg via ORAL

## 2015-12-09 MED ORDER — SODIUM CHLORIDE 0.9% FLUSH
10.0000 mL | INTRAVENOUS | Status: DC | PRN
  Administered 2015-12-09: 10 mL
  Filled 2015-12-09: qty 10

## 2015-12-09 NOTE — Telephone Encounter (Signed)
appt made and avs will print in treatment room °

## 2015-12-09 NOTE — Progress Notes (Signed)
[place]  Telephone:([phone]) [phone] Fax:(336) [phone]   ID: Alison [name] DOB: 03/09/1961  MR#: [id number]  KGY#:[id number]  Patient Care Team: Alison Pao, MD as PCP - General (Internal Medicine) Alison Luna, MD as Consulting Physician (General Surgery) Alison Cruel, MD as Consulting Physician (Oncology) Alison Silversmith, MD as Consulting Physician (Radiation Oncology) Alison Gell, MD as Consulting Physician (Obstetrics and Gynecology) Alison Sine, MD as Consulting Physician (Dermatology) PCP: Alison Pao, MD OTHER MD:  CHIEF COMPLAINT: Estrogen receptor positive breast cancer  CURRENT TREATMENT: Neoadjuvant chemotherapy  BREAST CANCER HISTORY: From the original intake note:  Alison [name] had routine screening mammography [facility] February 20 11/14/2014 showing a possible mass in the right breast. Diagnostic right mammography with right breast ultrasonography 11/01/2014 found the breast density to be category C. In the upper outer quadrant of the right breast there was a partially obscured mass which on physical exam was palpable as an area of thickening at the 10:00 position 8 cm from the nipple. Ultrasound showed multiple cysts in the upper outer right breast corresponding to the mass in question.  In November 2016 the patient felt a change in the upper left breast and brought it to her primary care physician's attention. Diagnostic left mammography with tomosynthesis and left breast ultrasonography at [facility] December 01/14/2015 found trabecular thickening throughout the left breast with skin thickening, but no circumscribed mass or suspicious calcifications. A rounded mass in the left axilla measured 6 mm. There was an additional mildly prominent lymph node in the left axilla which was what the patient had been palpating. Ultrasonography showed ill-defined swelling throughout the inner left breast with overlying  skin thickening.. At the 2:00 position 4 cm from the nipple there was an irregular hypoechoic mass measuring 2.3 cm. A second mass at the 1:00 area 3 cm from the nipple measured 1 cm. The distance between these 2 masses was 1.8 cm. There was also a round hypoechoic left axillary mass measuring 0.6 cm. Overall the was an area of 3.7 cm of mixed echogenicity corresponding to the area of palpable soft tissue swelling.   On 08/04/2015 the patient underwent biopsy of the mass at the 2:00 axis 4 cm from the nipple and a second biopsy was performed of the hypoechoic mass at the 1:00 position 3 cm from the nipple. Biopsy of the suspicious nodule in the lower left axilla was attempted but could not be completed as the mass became obscured after administration of lidocaine.  The pathology from both left breast biopsies 08/04/2015 ([id number]) showed invasive ductal carcinoma, grade 3. The prognostic profile obtained from the larger tumor showed estrogen receptor to be strongly positive at 95%, progesterone receptor positive at 30%, with an MIB-1 of 12%, and no HER-2 amplification, the signals ratio being 1.22 and the number per cell 2.20.  On 08/09/2015 the patient underwent biopsy of this suspicious left axillary lymph node noted above, and this showed ([id number]) metastatic carcinoma with extracapsular extension.  The patient's subsequent history is as detailed below  INTERVAL HISTORY: Alison [name] returns for follow up of her estrogen receptor positive breast cancer, accompanied by her husband Alison [name]. Today is day 1, cycle 8 of 12 planned cycles of weekly paclitaxel.  REVIEW OF SYSTEMS: Alison [name] had a bad experience with her last cycle, but fortunately it only lasted the day of treatment before essentially resolving. She has waves of nausea during the infusion that usually she can handle with a  light snack. Last week the nausea persisted and she almost vomited on the way home. Otherwise she is stable. She has  continued fatigued. She is moving her bowels well. Her appetite is good. She denies mouth sores, rashes, or neuropathy symptoms. A detailed review of systems is otherwise stable.  PAST MEDICAL HISTORY: Past Medical History  Diagnosis Date  . Hypertension   . Raynaud's disease   . Skin cancer   . Rectovaginal fistula     repaired 2014  . Osteopenia determined by x-ray     PAST SURGICAL HISTORY: Past Surgical History  Procedure Laterality Date  . C-section    . Abdominal hysterectomy      with BSO  . Appendectomy    . Colon resection    . Hernia repair      FAMILY HISTORY Family History  Problem Relation Age of Onset  . Lung cancer Father    the patient's father died from lung cancer at the age of 23 in the setting of tobacco abuse. The patient's mother died at the age of 56 with pancreatic cancer. The patient had no siblings. There is no history of breast or ovarian cancer in the family.  GYNECOLOGIC HISTORY:  No LMP recorded. Patient has had a hysterectomy. Menarche age 76, first live birth age 75. The patient is GX P1. She is status post total hysterectomy with bilateral salpingo-oophorectomy. She has been on hormone replacement since that time and is tapering to off at the time of this dictation.   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 She used to work at [employer]. Her husband Alison [name] works for [employer] division at a [employer] their son Alison [name] is currently at home.    ADVANCED DIRECTIVES: Not in place   HEALTH MAINTENANCE: Social History  Substance Use Topics  . Smoking status: Former Smoker -- 1.00 packs/day    Types: Cigarettes    Quit date: 11/25/2008  . Smokeless tobacco: Not on file  . Alcohol Use: No     Colonoscopy: April 2014/ Raynaldo Opitz at [facility]  PAP: s/p hysterectomy  Bone density: August 2016/ osteopenia  Lipid panel:  Allergies  Allergen Reactions  . Sulfa Antibiotics Other (See Comments)    Blisters in  mouth    Current Outpatient Prescriptions  Medication Sig Dispense Refill  . cetirizine (ZYRTEC) 10 MG tablet Take 10 mg by mouth daily.    . Cholecalciferol (VITAMIN D3) 2000 UNITS capsule Take 2,000 Units by mouth daily.     Marland Kitchen dextroamphetamine (DEXEDRINE SPANSULE) 15 MG 24 hr capsule Take 15 mg by mouth 2 (two) times daily.     . Fenofibric Acid 105 MG TABS Take 1 tablet by mouth daily.    . fluticasone (FLONASE) 50 MCG/ACT nasal spray Place 2 sprays into both nostrils daily. Reported on 11/18/2015    . gabapentin (NEURONTIN) 300 MG capsule Take 300 mg by mouth 3 (three) times daily.     Vanessa Kick Ethyl (VASCEPA) 1 G CAPS Take 2 capsules by mouth 2 (two) times daily.    . Multiple Vitamin (MULTIVITAMIN) tablet Take 1 tablet by mouth daily.    Marland Kitchen NIFEdipine (PROCARDIA-XL/ADALAT-CC/NIFEDICAL-XL) 30 MG 24 hr tablet Take 30 mg by mouth daily.     Marland Kitchen olmesartan (BENICAR) 20 MG tablet Take 1 tablet (20 mg total) by mouth daily. 90 tablet 4  . omeprazole (PRILOSEC) 40 MG capsule TAKE ONE CAPSULE EACH DAY 30 capsule 2  . Probiotic Product (PROBIOTIC DAILY PO)  Take 1 capsule by mouth daily.     . prochlorperazine (COMPAZINE) 10 MG tablet Take 1 tablet (10 mg total) by mouth every 6 (six) hours as needed (Nausea or vomiting). 30 tablet 1  . valACYclovir (VALTREX) 500 MG tablet Take 1 tablet (500 mg total) by mouth 2 (two) times daily. 30 tablet 1  . clobetasol cream (TEMOVATE) 9.62 % Apply 1 application topically 2 (two) times daily. (Patient not taking: Reported on 11/04/2015) 30 g 0  . doxycycline (VIBRA-TABS) 100 MG tablet Take 1 tablet (100 mg total) by mouth daily. (Patient not taking: Reported on 12/02/2015) 30 tablet 1  . eszopiclone (LUNESTA) 2 MG TABS tablet Take 1 tablet (2 mg total) by mouth at bedtime. Take immediately before bedtime 30 tablet 0  . fluconazole (DIFLUCAN) 100 MG tablet TAKE ONE TABLET BY MOUTH ONCE DAILY (Patient not taking: Reported on 12/09/2015) 30 tablet 1  . ondansetron  (ZOFRAN) 8 MG tablet Take 1 tablet (8 mg total) by mouth every 8 (eight) hours as needed for nausea or vomiting. (Patient not taking: Reported on 11/11/2015) 30 tablet 0  . polyethylene glycol (MIRALAX / GLYCOLAX) packet Take 17 g by mouth daily. Reported on 12/09/2015     No current facility-administered medications for this visit.    OBJECTIVE: Middle-aged white woman in no acute distress Filed Vitals:   12/09/15 1123  BP: 131/72  Pulse: 105  Temp: 97.4 F (36.3 C)  Resp: 18     Body mass index is 28.01 kg/(m^2).    ECOG FS:1 - Symptomatic but completely ambulatory   Skin: warm, dry  HEENT: sclerae anicteric, conjunctivae pink, oropharynx clear. No thrush or mucositis.  Lymph Nodes: No cervical or supraclavicular lymphadenopathy  Lungs: clear to auscultation bilaterally, no rales, wheezes, or rhonci  Heart: regular rate and rhythm  Abdomen: round, soft, non tender, positive bowel sounds  Musculoskeletal: No focal spinal tenderness, no peripheral edema  Neuro: non focal, well oriented, positive affect  Breasts: deferred  LAB RESULTS:  CMP     Component Value Date/Time   NA 137 12/09/2015 1110   NA 138 11/12/2015 0930   K 4.5 12/09/2015 1110   K 4.3 11/12/2015 0930   CL 108 11/12/2015 0930   CO2 21* 12/09/2015 1110   CO2 22 11/12/2015 0930   GLUCOSE 126 12/09/2015 1110   GLUCOSE 97 11/12/2015 0930   BUN 10.5 12/09/2015 1110   BUN 10 11/12/2015 0930   CREATININE 0.8 12/09/2015 1110   CREATININE 0.58 11/12/2015 0930   CALCIUM 9.4 12/09/2015 1110   CALCIUM 9.3 11/12/2015 0930   PROT 6.3* 12/09/2015 1110   PROT 7.1 11/12/2015 0930   ALBUMIN 3.5 12/09/2015 1110   ALBUMIN 4.0 11/12/2015 0930   AST 21 12/09/2015 1110   AST 23 11/12/2015 0930   ALT 16 12/09/2015 1110   ALT 18 11/12/2015 0930   ALKPHOS 38* 12/09/2015 1110   ALKPHOS 37* 11/12/2015 0930   BILITOT <0.30 12/09/2015 1110   BILITOT 0.8 11/12/2015 0930   GFRNONAA >60 11/12/2015 0930   GFRAA >60 11/12/2015  0930    INo results found for: SPEP, UPEP  Lab Results  Component Value Date   WBC 5.3 12/09/2015   NEUTROABS 3.3 12/09/2015   HGB 10.3* 12/09/2015   HCT 30.6* 12/09/2015   MCV 100.7 12/09/2015   PLT 302 12/09/2015      Chemistry      Component Value Date/Time   NA 137 12/09/2015 1110   NA 138 11/12/2015 0930  K 4.5 12/09/2015 1110   K 4.3 11/12/2015 0930   CL 108 11/12/2015 0930   CO2 21* 12/09/2015 1110   CO2 22 11/12/2015 0930   BUN 10.5 12/09/2015 1110   BUN 10 11/12/2015 0930   CREATININE 0.8 12/09/2015 1110   CREATININE 0.58 11/12/2015 0930      Component Value Date/Time   CALCIUM 9.4 12/09/2015 1110   CALCIUM 9.3 11/12/2015 0930   ALKPHOS 38* 12/09/2015 1110   ALKPHOS 37* 11/12/2015 0930   AST 21 12/09/2015 1110   AST 23 11/12/2015 0930   ALT 16 12/09/2015 1110   ALT 18 11/12/2015 0930   BILITOT <0.30 12/09/2015 1110   BILITOT 0.8 11/12/2015 0930       No results found for: LABCA2  No components found for: YHCWC376  No results for input(s): INR in the last 168 hours.  Urinalysis    Component Value Date/Time   COLORURINE YELLOW 11/12/2015 [place] 11/12/2015 0837   LABSPEC 1.006 11/12/2015 0837   PHURINE 6.5 11/12/2015 0837   GLUCOSEU NEGATIVE 11/12/2015 0837   HGBUR MODERATE* 11/12/2015 0837   [place] 11/12/2015 0837   [place] 11/12/2015 0837   PROTEINUR NEGATIVE 11/12/2015 0837   NITRITE NEGATIVE 11/12/2015 0837   LEUKOCYTESUR MODERATE* 11/12/2015 0837    STUDIES: No results found.  ASSESSMENT: 55 y.o. [place] woman status post 2 separate masses in the left breast 08/04/2015, clinically mT2 N1, stage IIb/IIIa invasive ductal carcinoma, grade 3, the larger mass being estrogen and progesterone receptor positive, HER-2 negative, with an MIB-1 of 12%  (a) biopsy of a left axillary lymph node 08/09/2015 was positive, with extracapsular extension  (1) neoadjuvant chemotherapy started 08/25/2015  consisting of doxorubicin and cyclophosphamide in dose dense fashion 4, completed 10/06/2015,  followed by weekly paclitaxel 12  (2) definitive surgery to follow  (3) adjuvant radiation to follow surgery  (4) anti-estrogens to follow completion of local treatment  (a) bone density August 2016 showed osteopenia  (b) the patient is status post total abdominal hysterectomy with bilateral salpingo-oophorectomy.  (5) the patient appears to be a good candidate for the Alliance trial  PLAN: Fletcher has had increasing nausea with each treatment. She is asking for more or different premeds. She does not like zofran because it causes headaches, so that rules aloxi out too. What we settled on was increasing her dexamethasone to 9m and adding compazine 128mto her final cycles of paclitaxel. She will proceed with cycle 9 of treatment as planned today.  The lunesta was refilled today.  [place] return in 1 week for cycle 10 of treatment. She understands and agrees with this plan. She knows the goal of treatment in her case is cure. She has been encouraged to call with any issues that might arise before her next visit here.    HeLaurie PandaNP   12/09/2015 12:35 PM

## 2015-12-09 NOTE — Patient Instructions (Signed)
Paclitaxel injection What is this medicine? PACLITAXEL (PAK li TAX el) is a chemotherapy drug. It targets fast dividing cells, like cancer cells, and causes these cells to die. This medicine is used to treat ovarian cancer, breast cancer, and other cancers. This medicine may be used for other purposes; ask your health care provider or pharmacist if you have questions. What should I tell my health care provider before I take this medicine? They need to know if you have any of these conditions: -blood disorders -irregular heartbeat -infection (especially a virus infection such as chickenpox, cold sores, or herpes) -liver disease -previous or ongoing radiation therapy -an unusual or allergic reaction to paclitaxel, alcohol, polyoxyethylated castor oil, other chemotherapy agents, other medicines, foods, dyes, or preservatives -pregnant or trying to get pregnant -breast-feeding How should I use this medicine? This drug is given as an infusion into a vein. It is administered in a hospital or clinic by a specially trained health care professional. Talk to your pediatrician regarding the use of this medicine in children. Special care may be needed. Overdosage: If you think you have taken too much of this medicine contact a poison control center or emergency room at once. NOTE: This medicine is only for you. Do not share this medicine with others. What if I miss a dose? It is important not to miss your dose. Call your doctor or health care professional if you are unable to keep an appointment. What may interact with this medicine? Do not take this medicine with any of the following medications: -disulfiram -metronidazole This medicine may also interact with the following medications: -cyclosporine -diazepam -ketoconazole -medicines to increase blood counts like filgrastim, pegfilgrastim, sargramostim -other chemotherapy drugs like cisplatin, doxorubicin, epirubicin, etoposide, teniposide,  vincristine -quinidine -testosterone -vaccines -verapamil Talk to your doctor or health care professional before taking any of these medicines: -acetaminophen -aspirin -ibuprofen -ketoprofen -naproxen This list may not describe all possible interactions. Give your health care provider a list of all the medicines, herbs, non-prescription drugs, or dietary supplements you use. Also tell them if you smoke, drink alcohol, or use illegal drugs. Some items may interact with your medicine. What should I watch for while using this medicine? Your condition will be monitored carefully while you are receiving this medicine. You will need important blood work done while you are taking this medicine. This drug may make you feel generally unwell. This is not uncommon, as chemotherapy can affect healthy cells as well as cancer cells. Report any side effects. Continue your course of treatment even though you feel ill unless your doctor tells you to stop. This medicine can cause serious allergic reactions. To reduce your risk you will need to take other medicine(s) before treatment with this medicine. In some cases, you may be given additional medicines to help with side effects. Follow all directions for their use. Call your doctor or health care professional for advice if you get a fever, chills or sore throat, or other symptoms of a cold or flu. Do not treat yourself. This drug decreases your body's ability to fight infections. Try to avoid being around people who are sick. This medicine may increase your risk to bruise or bleed. Call your doctor or health care professional if you notice any unusual bleeding. Be careful brushing and flossing your teeth or using a toothpick because you may get an infection or bleed more easily. If you have any dental work done, tell your dentist you are receiving this medicine. Avoid taking products that   contain aspirin, acetaminophen, ibuprofen, naproxen, or ketoprofen unless  instructed by your doctor. These medicines may hide a fever. Do not become pregnant while taking this medicine. Women should inform their doctor if they wish to become pregnant or think they might be pregnant. There is a potential for serious side effects to an unborn child. Talk to your health care professional or pharmacist for more information. Do not breast-feed an infant while taking this medicine. Men are advised not to father a child while receiving this medicine. This product may contain alcohol. Ask your pharmacist or healthcare provider if this medicine contains alcohol. Be sure to tell all healthcare providers you are taking this medicine. Certain medicines, like metronidazole and disulfiram, can cause an unpleasant reaction when taken with alcohol. The reaction includes flushing, headache, nausea, vomiting, sweating, and increased thirst. The reaction can last from 30 minutes to several hours. What side effects may I notice from receiving this medicine? Side effects that you should report to your doctor or health care professional as soon as possible: -allergic reactions like skin rash, itching or hives, swelling of the face, lips, or tongue -low blood counts - This drug may decrease the number of white blood cells, red blood cells and platelets. You may be at increased risk for infections and bleeding. -signs of infection - fever or chills, cough, sore throat, pain or difficulty passing urine -signs of decreased platelets or bleeding - bruising, pinpoint red spots on the skin, black, tarry stools, nosebleeds -signs of decreased red blood cells - unusually weak or tired, fainting spells, lightheadedness -breathing problems -chest pain -high or low blood pressure -mouth sores -nausea and vomiting -pain, swelling, redness or irritation at the injection site -pain, tingling, numbness in the hands or feet -slow or irregular heartbeat -swelling of the ankle, feet, hands Side effects that  usually do not require medical attention (report to your doctor or health care professional if they continue or are bothersome): -bone pain -complete hair loss including hair on your head, underarms, pubic hair, eyebrows, and eyelashes -changes in the color of fingernails -diarrhea -loosening of the fingernails -loss of appetite -muscle or joint pain -red flush to skin -sweating This list may not describe all possible side effects. Call your doctor for medical advice about side effects. You may report side effects to FDA at 1-800-FDA-1088. Where should I keep my medicine? This drug is given in a hospital or clinic and will not be stored at home. NOTE: This sheet is a summary. It may not cover all possible information. If you have questions about this medicine, talk to your doctor, pharmacist, or health care provider.    2016, [employer]vier/Gold Standard. (2015-03-31 13:02:56)  

## 2015-12-12 ENCOUNTER — Other Ambulatory Visit: Payer: Self-pay | Admitting: Nurse Practitioner

## 2015-12-12 DIAGNOSIS — C50412 Malignant neoplasm of upper-outer quadrant of left female breast: Secondary | ICD-10-CM

## 2015-12-16 ENCOUNTER — Ambulatory Visit (HOSPITAL_BASED_OUTPATIENT_CLINIC_OR_DEPARTMENT_OTHER): Payer: 59 | Admitting: Nurse Practitioner

## 2015-12-16 ENCOUNTER — Ambulatory Visit (HOSPITAL_BASED_OUTPATIENT_CLINIC_OR_DEPARTMENT_OTHER): Payer: 59

## 2015-12-16 ENCOUNTER — Other Ambulatory Visit (HOSPITAL_BASED_OUTPATIENT_CLINIC_OR_DEPARTMENT_OTHER): Payer: 59

## 2015-12-16 ENCOUNTER — Encounter: Payer: Self-pay | Admitting: Nurse Practitioner

## 2015-12-16 VITALS — BP 106/69 | HR 99 | Temp 97.8°F | Resp 18 | Wt 162.3 lb

## 2015-12-16 DIAGNOSIS — T451X5A Adverse effect of antineoplastic and immunosuppressive drugs, initial encounter: Secondary | ICD-10-CM

## 2015-12-16 DIAGNOSIS — D6481 Anemia due to antineoplastic chemotherapy: Secondary | ICD-10-CM

## 2015-12-16 DIAGNOSIS — Z5111 Encounter for antineoplastic chemotherapy: Secondary | ICD-10-CM | POA: Diagnosis not present

## 2015-12-16 DIAGNOSIS — C50412 Malignant neoplasm of upper-outer quadrant of left female breast: Secondary | ICD-10-CM

## 2015-12-16 LAB — COMPREHENSIVE METABOLIC PANEL
ALT: 16 U/L (ref 0–55)
ANION GAP: 8 meq/L (ref 3–11)
AST: 25 U/L (ref 5–34)
Albumin: 3.5 g/dL (ref 3.5–5.0)
Alkaline Phosphatase: 37 U/L — ABNORMAL LOW (ref 40–150)
BUN: 12 mg/dL (ref 7.0–26.0)
CHLORIDE: 109 meq/L (ref 98–109)
CO2: 20 mEq/L — ABNORMAL LOW (ref 22–29)
Calcium: 9.3 mg/dL (ref 8.4–10.4)
Creatinine: 0.8 mg/dL (ref 0.6–1.1)
EGFR: 80 mL/min/{1.73_m2} — AB (ref >90)
GLUCOSE: 126 mg/dL (ref 70–140)
POTASSIUM: 4.4 meq/L (ref 3.5–5.1)
SODIUM: 138 meq/L (ref 136–145)
TOTAL PROTEIN: 6.3 g/dL — AB (ref 6.4–8.3)

## 2015-12-16 LAB — CBC WITH DIFFERENTIAL/PLATELET
BASO%: 1.9 % (ref 0.0–2.0)
Basophils Absolute: 0.1 10*3/uL (ref 0.0–0.1)
EOS ABS: 0.1 10*3/uL (ref 0.0–0.5)
EOS%: 2.3 % (ref 0.0–7.0)
HCT: 32.4 % — ABNORMAL LOW (ref 34.8–46.6)
HGB: 10.7 g/dL — ABNORMAL LOW (ref 11.6–15.9)
LYMPH%: 27.3 % (ref 14.0–49.7)
MCH: 33.2 pg (ref 25.1–34.0)
MCHC: 33 g/dL (ref 31.5–36.0)
MCV: 100.6 fL (ref 79.5–101.0)
MONO#: 0.4 10*3/uL (ref 0.1–0.9)
MONO%: 7.5 % (ref 0.0–14.0)
NEUT%: 61 % (ref 38.4–76.8)
NEUTROS ABS: 3.5 10*3/uL (ref 1.5–6.5)
PLATELETS: 339 10*3/uL (ref 145–400)
RBC: 3.22 10*6/uL — AB (ref 3.70–5.45)
RDW: 15 % — ABNORMAL HIGH (ref 11.2–14.5)
WBC: 5.8 10*3/uL (ref 3.9–10.3)
lymph#: 1.6 10*3/uL (ref 0.9–3.3)

## 2015-12-16 MED ORDER — HEPARIN SOD (PORK) LOCK FLUSH 100 UNIT/ML IV SOLN
500.0000 [IU] | Freq: Once | INTRAVENOUS | Status: AC | PRN
  Administered 2015-12-16: 500 [IU]
  Filled 2015-12-16: qty 5

## 2015-12-16 MED ORDER — SODIUM CHLORIDE 0.9% FLUSH
10.0000 mL | INTRAVENOUS | Status: DC | PRN
  Administered 2015-12-16: 10 mL
  Filled 2015-12-16: qty 10

## 2015-12-16 MED ORDER — PROCHLORPERAZINE MALEATE 10 MG PO TABS: 10.0000 mg | ORAL_TABLET | Freq: Four times a day (QID) | ORAL | Status: DC | PRN

## 2015-12-16 MED ORDER — PROCHLORPERAZINE MALEATE 10 MG PO TABS
ORAL_TABLET | ORAL | Status: AC
  Filled 2015-12-16: qty 1

## 2015-12-16 MED ORDER — PROCHLORPERAZINE MALEATE 10 MG PO TABS
10.0000 mg | ORAL_TABLET | Freq: Four times a day (QID) | ORAL | Status: DC | PRN
  Administered 2015-12-16: 10 mg via ORAL

## 2015-12-16 MED ORDER — DIPHENHYDRAMINE HCL 50 MG/ML IJ SOLN
INTRAMUSCULAR | Status: AC
  Filled 2015-12-16: qty 1

## 2015-12-16 MED ORDER — FAMOTIDINE IN NACL 20-0.9 MG/50ML-% IV SOLN
INTRAVENOUS | Status: AC
  Filled 2015-12-16: qty 50

## 2015-12-16 MED ORDER — DIPHENHYDRAMINE HCL 50 MG/ML IJ SOLN
25.0000 mg | Freq: Once | INTRAMUSCULAR | Status: AC
  Administered 2015-12-16: 25 mg via INTRAVENOUS

## 2015-12-16 MED ORDER — SODIUM CHLORIDE 0.9 % IV SOLN
10.0000 mg | Freq: Once | INTRAVENOUS | Status: AC
  Administered 2015-12-16: 10 mg via INTRAVENOUS
  Filled 2015-12-16: qty 1

## 2015-12-16 MED ORDER — FAMOTIDINE IN NACL 20-0.9 MG/50ML-% IV SOLN
20.0000 mg | Freq: Once | INTRAVENOUS | Status: AC
  Administered 2015-12-16: 20 mg via INTRAVENOUS

## 2015-12-16 MED ORDER — SODIUM CHLORIDE 0.9 % IV SOLN
Freq: Once | INTRAVENOUS | Status: AC
  Administered 2015-12-16: 12:00:00 via INTRAVENOUS

## 2015-12-16 MED ORDER — SODIUM CHLORIDE 0.9 % IV SOLN
80.0000 mg/m2 | Freq: Once | INTRAVENOUS | Status: AC
  Administered 2015-12-16: 138 mg via INTRAVENOUS
  Filled 2015-12-16: qty 23

## 2015-12-16 NOTE — Progress Notes (Signed)
[place]  [phone] [phone]   ID: Alison [name] DOB: [birth year]  MR#: [id number]  GYJ#:[id number]  Patient Care Team: [name], MD as PCP - General (Internal Medicine) Erroll Luna, MD as Consulting Physician (General Surgery) Chauncey Cruel, MD as Consulting Physician (Oncology) [name], MD as Consulting Physician (Radiation Oncology) Aloha Gell, MD as Consulting Physician (Obstetrics and Gynecology) Harriett Sine, MD as Consulting Physician (Dermatology) PCP: [name], MD OTHER MD:  CHIEF COMPLAINT: Estrogen receptor positive breast cancer  CURRENT TREATMENT: Neoadjuvant chemotherapy  BREAST CANCER HISTORY: From the original intake note:  Alison [name] had routine screening mammography with tomography [facility] February 20 11/14/2014 showing a possible mass in the right breast. Diagnostic right mammography with right breast ultrasonography 11/01/2014 found the breast density to be category C. In the upper outer quadrant of the right breast there was a partially obscured mass which on physical exam was palpable as an area of thickening at the 10:00 position 8 cm from the nipple. Ultrasound showed multiple cysts in the upper outer right breast corresponding to the mass in question.  In November 2016 the patient felt a change in the upper left breast and brought it to her primary care physician's attention. Diagnostic left mammography with tomosynthesis and left breast ultrasonography at [facility] December 01/14/2015 found trabecular thickening throughout the left breast with skin thickening, but no circumscribed mass or suspicious calcifications. A rounded mass in the left axilla measured 6 mm. There was an additional mildly prominent lymph node in the left axilla which was what the patient had been palpating. Ultrasonography showed ill-defined swelling throughout the inner left breast with overlying  skin thickening.. At the 2:00 position 4 cm from the nipple there was an irregular hypoechoic mass measuring 2.3 cm. A second mass at the 1:00 area 3 cm from the nipple measured 1 cm. The distance between these 2 masses was 1.8 cm. There was also a round hypoechoic left axillary mass measuring 0.6 cm. Overall the was an area of 3.7 cm of mixed echogenicity corresponding to the area of palpable soft tissue swelling.   On 08/04/2015 the patient underwent biopsy of the mass at the 2:00 axis 4 cm from the nipple and a second biopsy was performed of the hypoechoic mass at the 1:00 position 3 cm from the nipple. Biopsy of the suspicious nodule in the lower left axilla was attempted but could not be completed as the mass became obscured after administration of lidocaine.  The pathology from both left breast biopsies 08/04/2015 ([id number]) showed invasive ductal carcinoma, grade 3. The prognostic profile obtained from the larger tumor showed estrogen receptor to be strongly positive at 95%, progesterone receptor positive at 30%, with an MIB-1 of 12%, and no HER-2 amplification, the signals ratio being 1.22 and the number per cell 2.20.  On 08/09/2015 the patient underwent biopsy of this suspicious left axillary lymph node noted above, and this showed ([id number]) metastatic carcinoma with extracapsular extension.  The patient's subsequent history is as detailed below  INTERVAL HISTORY: Alison [name] returns for follow up of her estrogen receptor positive breast cancer, accompanied by her husband Alison [name]. Today is day 1, cycle 9 of 12 planned cycles of weekly paclitaxel.  REVIEW OF SYSTEMS: Alison [name] denies fevers or chills. Her nausea was noticeably reduced since increasing her premed steroid dose to 61m and adding in compazine. She is moving her bowels well. She denies mouth sores, rashes, or neuropathy symptoms.  Her appetite is good. She is still fatigued, but is able to press through. She sleeps well at night with  lunesta. A detailed review of systems is otherwise stable.  PAST MEDICAL HISTORY: Past Medical History  Diagnosis Date  . Hypertension   . Raynaud's disease   . Skin cancer   . Rectovaginal fistula     repaired 2014  . Osteopenia determined by x-ray     PAST SURGICAL HISTORY: Past Surgical History  Procedure Laterality Date  . C-section    . Abdominal hysterectomy      with BSO  . Appendectomy    . Colon resection    . Hernia repair      FAMILY HISTORY Family History  Problem Relation Age of Onset  . Lung cancer Father    the patient's father died from lung cancer at the age of 9 in the setting of tobacco abuse. The patient's mother died at the age of 41 with pancreatic cancer. The patient had no siblings. There is no history of breast or ovarian cancer in the family.  GYNECOLOGIC HISTORY:  No LMP recorded. Patient has had a hysterectomy. Menarche age 64, first live birth age 45. The patient is GX P1. She is status post total hysterectomy with bilateral salpingo-oophorectomy. She has been on hormone replacement since that time and is tapering to off at the time of this dictation.   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 She used to work at [employer]. Her husband Alison [name] works for [employer] division at a [employer] their son Alison [name] is currently at home.    ADVANCED DIRECTIVES: Not in place   HEALTH MAINTENANCE: Social History  Substance Use Topics  . Smoking status: Former Smoker -- 1.00 packs/day    Types: Cigarettes    Quit date: 11/25/2008  . Smokeless tobacco: Not on file  . Alcohol Use: No     Colonoscopy: April 2014/ Raynaldo Opitz at [facility]  PAP: s/p hysterectomy  Bone density: August 2016/ osteopenia  Lipid panel:  Allergies  Allergen Reactions  . Sulfa Antibiotics Other (See Comments)    Blisters in mouth    Current Outpatient Prescriptions  Medication Sig Dispense Refill  . cetirizine (ZYRTEC) 10 MG tablet Take 10  mg by mouth daily.    . Cholecalciferol (VITAMIN D3) 2000 UNITS capsule Take 2,000 Units by mouth daily.     . clobetasol cream (TEMOVATE) 7.89 % Apply 1 application topically 2 (two) times daily. 30 g 0  . dextroamphetamine (DEXEDRINE SPANSULE) 15 MG 24 hr capsule Take 15 mg by mouth 2 (two) times daily.     Marland Kitchen doxycycline (VIBRA-TABS) 100 MG tablet Take 1 tablet (100 mg total) by mouth daily. 30 tablet 1  . eszopiclone (LUNESTA) 2 MG TABS tablet Take 1 tablet (2 mg total) by mouth at bedtime. Take immediately before bedtime 30 tablet 0  . fenofibrate (TRICOR) 145 MG tablet Take by mouth.    . Fenofibric Acid 105 MG TABS Take 1 tablet by mouth daily.    . fluconazole (DIFLUCAN) 100 MG tablet TAKE ONE TABLET BY MOUTH ONCE DAILY 30 tablet 1  . fluticasone (FLONASE) 50 MCG/ACT nasal spray Place 2 sprays into both nostrils daily. Reported on 11/18/2015    . gabapentin (NEURONTIN) 300 MG capsule Take 300 mg by mouth 3 (three) times daily.     Marland Kitchen ibuprofen (ADVIL,MOTRIN) 200 MG tablet Take by mouth.    Vanessa Kick Ethyl (VASCEPA) 1 G CAPS Take  2 capsules by mouth 2 (two) times daily.    . Multiple Vitamin (MULTIVITAMIN) tablet Take 1 tablet by mouth daily.    Marland Kitchen NIFEdipine (PROCARDIA-XL/ADALAT-CC/NIFEDICAL-XL) 30 MG 24 hr tablet Take 30 mg by mouth daily.     Marland Kitchen olmesartan (BENICAR) 20 MG tablet Take 1 tablet (20 mg total) by mouth daily. 90 tablet 4  . omeprazole (PRILOSEC) 40 MG capsule TAKE ONE CAPSULE EACH DAY 30 capsule 2  . ondansetron (ZOFRAN) 8 MG tablet Take 1 tablet (8 mg total) by mouth every 8 (eight) hours as needed for nausea or vomiting. 30 tablet 0  . polyethylene glycol (MIRALAX / GLYCOLAX) packet Take 17 g by mouth daily. Reported on 12/09/2015    . Probiotic Product (PROBIOTIC & ACIDOPHILUS EX ST PO) Take by mouth.    . Probiotic Product (PROBIOTIC DAILY PO) Take 1 capsule by mouth daily.     . prochlorperazine (COMPAZINE) 10 MG tablet Take 1 tablet (10 mg total) by mouth every 6 (six)  hours as needed (Nausea or vomiting). 30 tablet 3  . valACYclovir (VALTREX) 500 MG tablet Take 1 tablet (500 mg total) by mouth 2 (two) times daily. 30 tablet 1   No current facility-administered medications for this visit.    OBJECTIVE: Middle-aged white woman in no acute distress Filed Vitals:   12/16/15 1048  BP: 106/69  Pulse: 99  Temp: 97.8 F (36.6 C)  Resp: 18     Body mass index is 28.76 kg/(m^2).    ECOG FS:1 - Symptomatic but completely ambulatory  Sclerae unicteric, pupils round and equal Oropharynx clear and moist-- no thrush or other lesions No cervical or supraclavicular adenopathy Lungs no rales or rhonchi Heart regular rate and rhythm Abd soft, nontender, positive bowel sounds MSK no focal spinal tenderness, no upper extremity lymphedema Neuro: nonfocal, well oriented, appropriate affect Breasts: deferred  LAB RESULTS:  CMP     Component Value Date/Time   NA 137 12/09/2015 1110   NA 138 11/12/2015 0930   K 4.5 12/09/2015 1110   K 4.3 11/12/2015 0930   CL 108 11/12/2015 0930   CO2 21* 12/09/2015 1110   CO2 22 11/12/2015 0930   GLUCOSE 126 12/09/2015 1110   GLUCOSE 97 11/12/2015 0930   BUN 10.5 12/09/2015 1110   BUN 10 11/12/2015 0930   CREATININE 0.8 12/09/2015 1110   CREATININE 0.58 11/12/2015 0930   CALCIUM 9.4 12/09/2015 1110   CALCIUM 9.3 11/12/2015 0930   PROT 6.3* 12/09/2015 1110   PROT 7.1 11/12/2015 0930   ALBUMIN 3.5 12/09/2015 1110   ALBUMIN 4.0 11/12/2015 0930   AST 21 12/09/2015 1110   AST 23 11/12/2015 0930   ALT 16 12/09/2015 1110   ALT 18 11/12/2015 0930   ALKPHOS 38* 12/09/2015 1110   ALKPHOS 37* 11/12/2015 0930   BILITOT <0.30 12/09/2015 1110   BILITOT 0.8 11/12/2015 0930   GFRNONAA >60 11/12/2015 0930   GFRAA >60 11/12/2015 0930    INo results found for: SPEP, UPEP  Lab Results  Component Value Date   WBC 5.8 12/16/2015   NEUTROABS 3.5 12/16/2015   HGB 10.7* 12/16/2015   HCT 32.4* 12/16/2015   MCV 100.6 12/16/2015     PLT 339 12/16/2015      Chemistry      Component Value Date/Time   NA 137 12/09/2015 1110   NA 138 11/12/2015 0930   K 4.5 12/09/2015 1110   K 4.3 11/12/2015 0930   CL 108 11/12/2015 0930   CO2  21* 12/09/2015 1110   CO2 22 11/12/2015 0930   BUN 10.5 12/09/2015 1110   BUN 10 11/12/2015 0930   CREATININE 0.8 12/09/2015 1110   CREATININE 0.58 11/12/2015 0930      Component Value Date/Time   CALCIUM 9.4 12/09/2015 1110   CALCIUM 9.3 11/12/2015 0930   ALKPHOS 38* 12/09/2015 1110   ALKPHOS 37* 11/12/2015 0930   AST 21 12/09/2015 1110   AST 23 11/12/2015 0930   ALT 16 12/09/2015 1110   ALT 18 11/12/2015 0930   BILITOT <0.30 12/09/2015 1110   BILITOT 0.8 11/12/2015 0930       No results found for: LABCA2  No components found for: ERDEY814  No results for input(s): INR in the last 168 hours.  Urinalysis    Component Value Date/Time   COLORURINE YELLOW 11/12/2015 [place] 11/12/2015 0837   LABSPEC 1.006 11/12/2015 0837   PHURINE 6.5 11/12/2015 0837   GLUCOSEU NEGATIVE 11/12/2015 0837   HGBUR MODERATE* 11/12/2015 0837   [place] 11/12/2015 0837   [place] 11/12/2015 0837   PROTEINUR NEGATIVE 11/12/2015 0837   NITRITE NEGATIVE 11/12/2015 0837   LEUKOCYTESUR MODERATE* 11/12/2015 0837    STUDIES: No results found.  ASSESSMENT: 55 y.o. [place] woman status post 2 separate masses in the left breast 08/04/2015, clinically mT2 N1, stage IIb/IIIa invasive ductal carcinoma, grade 3, the larger mass being estrogen and progesterone receptor positive, HER-2 negative, with an MIB-1 of 12%  (a) biopsy of a left axillary lymph node 08/09/2015 was positive, with extracapsular extension  (1) neoadjuvant chemotherapy started 08/25/2015 consisting of doxorubicin and cyclophosphamide in dose dense fashion 4, completed 10/06/2015,  followed by weekly paclitaxel 12  (2) definitive surgery to follow  (3) adjuvant radiation to follow  surgery  (4) anti-estrogens to follow completion of local treatment  (a) bone density August 2016 showed osteopenia  (b) the patient is status post total abdominal hysterectomy with bilateral salpingo-oophorectomy.  (5) the patient appears to be a good candidate for the [facility] trial  PLAN: Alison [name] had a better experience with the adjustments made to her premeds last week. The labs were reviewed in detail. Her treatment related anemia has improved slightly to a hgb of 10.7. She will proceed with cycle 9 of paclitaxel as planned today.  Alison [name] will return in 1 week for cycle 10 of treatment. Her next follow up visit will be with cycle 11. She understands and agrees with this plan. She knows the goal of treatment in her case is cure. She has been encouraged to call with any issues that might arise before her next visit here.    Laurie Panda, NP   12/16/2015 11:08 AM

## 2015-12-16 NOTE — Patient Instructions (Signed)
[facility] Discharge Instructions for Patients Receiving Chemotherapy  Today you received the following chemotherapy agents:  Taxol  To help prevent nausea and vomiting after your treatment, we encourage you to take your nausea medication as prescribed.   If you develop nausea and vomiting that is not controlled by your nausea medication, call the clinic.   BELOW ARE SYMPTOMS THAT SHOULD BE REPORTED IMMEDIATELY:  *FEVER GREATER THAN 100.5 F  *CHILLS WITH OR WITHOUT FEVER  NAUSEA AND VOMITING THAT IS NOT CONTROLLED WITH YOUR NAUSEA MEDICATION  *UNUSUAL SHORTNESS OF BREATH  *UNUSUAL BRUISING OR BLEEDING  TENDERNESS IN MOUTH AND THROAT WITH OR WITHOUT PRESENCE OF ULCERS  *URINARY PROBLEMS  *BOWEL PROBLEMS  UNUSUAL RASH Items with * indicate a potential emergency and should be followed up as soon as possible.  Feel free to call the clinic you have any questions or concerns. The clinic phone number is [phone].  Please show the CHEMO ALERT CARD at check-in to the Emergency Department and triage nurse.   

## 2015-12-23 ENCOUNTER — Ambulatory Visit (HOSPITAL_BASED_OUTPATIENT_CLINIC_OR_DEPARTMENT_OTHER): Payer: 59

## 2015-12-23 ENCOUNTER — Other Ambulatory Visit (HOSPITAL_BASED_OUTPATIENT_CLINIC_OR_DEPARTMENT_OTHER): Payer: 59

## 2015-12-23 VITALS — BP 108/59 | HR 16 | Temp 97.8°F | Resp 16

## 2015-12-23 DIAGNOSIS — C50412 Malignant neoplasm of upper-outer quadrant of left female breast: Secondary | ICD-10-CM | POA: Diagnosis not present

## 2015-12-23 DIAGNOSIS — Z5111 Encounter for antineoplastic chemotherapy: Secondary | ICD-10-CM | POA: Diagnosis not present

## 2015-12-23 LAB — CBC WITH DIFFERENTIAL/PLATELET
BASO%: 1.5 % (ref 0.0–2.0)
BASOS ABS: 0.1 10*3/uL (ref 0.0–0.1)
EOS ABS: 0.1 10*3/uL (ref 0.0–0.5)
EOS%: 1.5 % (ref 0.0–7.0)
HEMATOCRIT: 32.9 % — AB (ref 34.8–46.6)
HGB: 11 g/dL — ABNORMAL LOW (ref 11.6–15.9)
LYMPH#: 1.2 10*3/uL (ref 0.9–3.3)
LYMPH%: 22.3 % (ref 14.0–49.7)
MCH: 33 pg (ref 25.1–34.0)
MCHC: 33.4 g/dL (ref 31.5–36.0)
MCV: 98.8 fL (ref 79.5–101.0)
MONO#: 0.3 10*3/uL (ref 0.1–0.9)
MONO%: 5.6 % (ref 0.0–14.0)
NEUT#: 3.7 10*3/uL (ref 1.5–6.5)
NEUT%: 69.1 % (ref 38.4–76.8)
PLATELETS: 317 10*3/uL (ref 145–400)
RBC: 3.33 10*6/uL — AB (ref 3.70–5.45)
RDW: 14.9 % — ABNORMAL HIGH (ref 11.2–14.5)
WBC: 5.3 10*3/uL (ref 3.9–10.3)

## 2015-12-23 LAB — COMPREHENSIVE METABOLIC PANEL
ALT: 26 U/L (ref 0–55)
ANION GAP: 8 meq/L (ref 3–11)
AST: 29 U/L (ref 5–34)
Albumin: 3.6 g/dL (ref 3.5–5.0)
Alkaline Phosphatase: 41 U/L (ref 40–150)
BUN: 10.5 mg/dL (ref 7.0–26.0)
CHLORIDE: 108 meq/L (ref 98–109)
CO2: 21 meq/L — AB (ref 22–29)
CREATININE: 0.7 mg/dL (ref 0.6–1.1)
Calcium: 9.3 mg/dL (ref 8.4–10.4)
EGFR: >90 mL/min/{1.73_m2} (ref >90)
GLUCOSE: 160 mg/dL — AB (ref 70–140)
Potassium: 4.1 mEq/L (ref 3.5–5.1)
SODIUM: 137 meq/L (ref 136–145)
TOTAL PROTEIN: 6.4 g/dL (ref 6.4–8.3)

## 2015-12-23 MED ORDER — SODIUM CHLORIDE 0.9 % IV SOLN
10.0000 mg | Freq: Once | INTRAVENOUS | Status: DC
Start: 2015-12-23 — End: 2015-12-23
  Filled 2015-12-23: qty 1

## 2015-12-23 MED ORDER — PROCHLORPERAZINE MALEATE 10 MG PO TABS
10.0000 mg | ORAL_TABLET | Freq: Four times a day (QID) | ORAL | Status: DC | PRN
  Administered 2015-12-23: 10 mg via ORAL

## 2015-12-23 MED ORDER — FAMOTIDINE IN NACL 20-0.9 MG/50ML-% IV SOLN
20.0000 mg | Freq: Once | INTRAVENOUS | Status: AC
  Administered 2015-12-23: 20 mg via INTRAVENOUS

## 2015-12-23 MED ORDER — DIPHENHYDRAMINE HCL 50 MG/ML IJ SOLN
25.0000 mg | Freq: Once | INTRAMUSCULAR | Status: AC
  Administered 2015-12-23: 25 mg via INTRAVENOUS

## 2015-12-23 MED ORDER — HEPARIN SOD (PORK) LOCK FLUSH 100 UNIT/ML IV SOLN
500.0000 [IU] | Freq: Once | INTRAVENOUS | Status: AC
  Administered 2015-12-23: 500 [IU] via INTRAVENOUS
  Filled 2015-12-23: qty 5

## 2015-12-23 MED ORDER — SODIUM CHLORIDE 0.9 % IV SOLN
80.0000 mg/m2 | Freq: Once | INTRAVENOUS | Status: AC
  Administered 2015-12-23: 138 mg via INTRAVENOUS
  Filled 2015-12-23: qty 23

## 2015-12-23 MED ORDER — SODIUM CHLORIDE 0.9 % IV SOLN
Freq: Once | INTRAVENOUS | Status: AC
  Administered 2015-12-23: 14:00:00 via INTRAVENOUS

## 2015-12-23 MED ORDER — DIPHENHYDRAMINE HCL 50 MG/ML IJ SOLN
INTRAMUSCULAR | Status: AC
  Filled 2015-12-23: qty 1

## 2015-12-23 MED ORDER — FAMOTIDINE IN NACL 20-0.9 MG/50ML-% IV SOLN
INTRAVENOUS | Status: AC
  Filled 2015-12-23: qty 50

## 2015-12-23 MED ORDER — PROCHLORPERAZINE MALEATE 10 MG PO TABS
ORAL_TABLET | ORAL | Status: AC
  Filled 2015-12-23: qty 1

## 2015-12-23 MED ORDER — SODIUM CHLORIDE 0.9% FLUSH
10.0000 mL | INTRAVENOUS | Status: DC | PRN
  Administered 2015-12-23: 10 mL via INTRAVENOUS
  Filled 2015-12-23: qty 10

## 2015-12-23 NOTE — Patient Instructions (Signed)
[facility] Discharge Instructions for Patients Receiving Chemotherapy  Today you received the following chemotherapy agents:  Taxol  To help prevent nausea and vomiting after your treatment, we encourage you to take your nausea medication as prescribed.   If you develop nausea and vomiting that is not controlled by your nausea medication, call the clinic.   BELOW ARE SYMPTOMS THAT SHOULD BE REPORTED IMMEDIATELY:  *FEVER GREATER THAN 100.5 F  *CHILLS WITH OR WITHOUT FEVER  NAUSEA AND VOMITING THAT IS NOT CONTROLLED WITH YOUR NAUSEA MEDICATION  *UNUSUAL SHORTNESS OF BREATH  *UNUSUAL BRUISING OR BLEEDING  TENDERNESS IN MOUTH AND THROAT WITH OR WITHOUT PRESENCE OF ULCERS  *URINARY PROBLEMS  *BOWEL PROBLEMS  UNUSUAL RASH Items with * indicate a potential emergency and should be followed up as soon as possible.  Feel free to call the clinic you have any questions or concerns. The clinic phone number is [phone].  Please show the CHEMO ALERT CARD at check-in to the Emergency Department and triage nurse.   

## 2015-12-27 ENCOUNTER — Other Ambulatory Visit: Payer: Self-pay | Admitting: Nurse Practitioner

## 2015-12-30 ENCOUNTER — Encounter: Payer: Self-pay | Admitting: Nurse Practitioner

## 2015-12-30 ENCOUNTER — Other Ambulatory Visit (HOSPITAL_BASED_OUTPATIENT_CLINIC_OR_DEPARTMENT_OTHER): Payer: 59

## 2015-12-30 ENCOUNTER — Ambulatory Visit (HOSPITAL_BASED_OUTPATIENT_CLINIC_OR_DEPARTMENT_OTHER): Payer: 59 | Admitting: Nurse Practitioner

## 2015-12-30 ENCOUNTER — Ambulatory Visit: Payer: 59

## 2015-12-30 VITALS — BP 118/56 | HR 108 | Temp 97.7°F | Resp 18 | Ht 63.0 in | Wt 155.3 lb

## 2015-12-30 DIAGNOSIS — T451X5A Adverse effect of antineoplastic and immunosuppressive drugs, initial encounter: Secondary | ICD-10-CM

## 2015-12-30 DIAGNOSIS — M2550 Pain in unspecified joint: Secondary | ICD-10-CM | POA: Diagnosis not present

## 2015-12-30 DIAGNOSIS — C50412 Malignant neoplasm of upper-outer quadrant of left female breast: Secondary | ICD-10-CM

## 2015-12-30 DIAGNOSIS — G620 Drug-induced polyneuropathy: Secondary | ICD-10-CM | POA: Diagnosis not present

## 2015-12-30 DIAGNOSIS — Z170 Estrogen receptor positive status [ER+]: Secondary | ICD-10-CM

## 2015-12-30 LAB — COMPREHENSIVE METABOLIC PANEL
ALT: 34 U/L (ref 0–55)
AST: 42 U/L — AB (ref 5–34)
Albumin: 3.8 g/dL (ref 3.5–5.0)
Alkaline Phosphatase: 33 U/L — ABNORMAL LOW (ref 40–150)
Anion Gap: 9 mEq/L (ref 3–11)
BUN: 15.2 mg/dL (ref 7.0–26.0)
CHLORIDE: 105 meq/L (ref 98–109)
CO2: 22 mEq/L (ref 22–29)
Calcium: 9.9 mg/dL (ref 8.4–10.4)
Creatinine: 1 mg/dL (ref 0.6–1.1)
EGFR: 64 mL/min/{1.73_m2} — AB (ref >90)
GLUCOSE: 122 mg/dL (ref 70–140)
POTASSIUM: 4.5 meq/L (ref 3.5–5.1)
SODIUM: 136 meq/L (ref 136–145)
Total Bilirubin: 0.45 mg/dL (ref 0.20–1.20)
Total Protein: 6.7 g/dL (ref 6.4–8.3)

## 2015-12-30 LAB — CBC WITH DIFFERENTIAL/PLATELET
BASO%: 1.9 % (ref 0.0–2.0)
Basophils Absolute: 0.1 10*3/uL (ref 0.0–0.1)
EOS%: 1.7 % (ref 0.0–7.0)
Eosinophils Absolute: 0.1 10*3/uL (ref 0.0–0.5)
HCT: 35.6 % (ref 34.8–46.6)
HGB: 11.7 g/dL (ref 11.6–15.9)
LYMPH%: 21.1 % (ref 14.0–49.7)
MCH: 32.4 pg (ref 25.1–34.0)
MCHC: 32.9 g/dL (ref 31.5–36.0)
MCV: 98.6 fL (ref 79.5–101.0)
MONO#: 0.6 10*3/uL (ref 0.1–0.9)
MONO%: 10.6 % (ref 0.0–14.0)
NEUT#: 3.9 10*3/uL (ref 1.5–6.5)
NEUT%: 64.7 % (ref 38.4–76.8)
Platelets: 329 10*3/uL (ref 145–400)
RBC: 3.61 10*6/uL — ABNORMAL LOW (ref 3.70–5.45)
RDW: 15.3 % — ABNORMAL HIGH (ref 11.2–14.5)
WBC: 6.1 10*3/uL (ref 3.9–10.3)
lymph#: 1.3 10*3/uL (ref 0.9–3.3)

## 2015-12-30 MED ORDER — HYDROCODONE-ACETAMINOPHEN 5-325 MG PO TABS: 1.0000 | ORAL_TABLET | Freq: Four times a day (QID) | ORAL | Status: DC | PRN

## 2015-12-30 NOTE — Progress Notes (Signed)
[place]  [phone] [phone]   ID: Alison [name] DOB: 27-Oct-1960  MR#: [id number]  YKD#:[id number]  Patient Care Team: [name], MD as PCP - General (Internal Medicine) Erroll Luna, MD as Consulting Physician (General Surgery) Chauncey Cruel, MD as Consulting Physician (Oncology) [name], MD as Consulting Physician (Radiation Oncology) Aloha Gell, MD as Consulting Physician (Obstetrics and Gynecology) Harriett Sine, MD as Consulting Physician (Dermatology) PCP: [name], MD OTHER MD:  CHIEF COMPLAINT: Estrogen receptor positive breast cancer  CURRENT TREATMENT: Neoadjuvant chemotherapy  BREAST CANCER HISTORY: From the original intake note:  Alison [name] had routine screening mammography with [facility] February 20 11/14/2014 showing a possible mass in the right breast. Diagnostic right mammography with right breast ultrasonography 11/01/2014 found the breast density to be category C. In the upper outer quadrant of the right breast there was a partially obscured mass which on physical exam was palpable as an area of thickening at the 10:00 position 8 cm from the nipple. Ultrasound showed multiple cysts in the upper outer right breast corresponding to the mass in question.  In November 2016 the patient felt a change in the upper left breast and brought it to her primary care physician's attention. Diagnostic left mammography with tomosynthesis and left breast ultrasonography at [facility] December 01/14/2015 found trabecular thickening throughout the left breast with skin thickening, but no circumscribed mass or suspicious calcifications. A rounded mass in the left axilla measured 6 mm. There was an additional mildly prominent lymph node in the left axilla which was what the patient had been palpating. Ultrasonography showed ill-defined swelling throughout the inner left breast with overlying  skin thickening.. At the 2:00 position 4 cm from the nipple there was an irregular hypoechoic mass measuring 2.3 cm. A second mass at the 1:00 area 3 cm from the nipple measured 1 cm. The distance between these 2 masses was 1.8 cm. There was also a round hypoechoic left axillary mass measuring 0.6 cm. Overall the was an area of 3.7 cm of mixed echogenicity corresponding to the area of palpable soft tissue swelling.   On 08/04/2015 the patient underwent biopsy of the mass at the 2:00 axis 4 cm from the nipple and a second biopsy was performed of the hypoechoic mass at the 1:00 position 3 cm from the nipple. Biopsy of the suspicious nodule in the lower left axilla was attempted but could not be completed as the mass became obscured after administration of lidocaine.  The pathology from both left breast biopsies 08/04/2015 ([id number]) showed invasive ductal carcinoma, grade 3. The prognostic profile obtained from the larger tumor showed estrogen receptor to be strongly positive at 95%, progesterone receptor positive at 30%, with an MIB-1 of 12%, and no HER-2 amplification, the signals ratio being 1.22 and the number per cell 2.20.  On 08/09/2015 the patient underwent biopsy of this suspicious left axillary lymph node noted above, and this showed ([id number]) metastatic carcinoma with extracapsular extension.  The patient's subsequent history is as detailed below  INTERVAL HISTORY: Alison [name] returns for follow up of her estrogen receptor positive breast cancer, accompanied by her husband Alison [name]. Today is day 1, cycle 11 of 12 planned cycles of weekly paclitaxel.  REVIEW OF SYSTEMS: Somer feels poorly today. Her appetite is down and she has lost 7lb. She has had more nausea. Her main complaint is full body and joint pain. She is taking tramadol 64m every 6 hours alternating  with NSADs the opposite 6 hours. She is also on gabapentin TID. She has been using heat pads. She does not sleep well. She is  increasingly fatigued. She has constant numbness to her fingertips and intermittent symptoms to her feet. A detailed review of systems is otherwise stable.   PAST MEDICAL HISTORY: Past Medical History  Diagnosis Date  . Hypertension   . Raynaud's disease   . Skin cancer   . Rectovaginal fistula     repaired 2014  . Osteopenia determined by x-ray     PAST SURGICAL HISTORY: Past Surgical History  Procedure Laterality Date  . C-section    . Abdominal hysterectomy      with BSO  . Appendectomy    . Colon resection    . Hernia repair      FAMILY HISTORY Family History  Problem Relation Age of Onset  . Lung cancer Father    the patient's father died from lung cancer at the age of 9 in the setting of tobacco abuse. The patient's mother died at the age of 28 with pancreatic cancer. The patient had no siblings. There is no history of breast or ovarian cancer in the family.  GYNECOLOGIC HISTORY:  No LMP recorded. Patient has had a hysterectomy. Menarche age 12, first live birth age 82. The patient is GX P1. She is status post total hysterectomy with bilateral salpingo-oophorectomy. She has been on hormone replacement since that time and is tapering to off at the time of this dictation.   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 She used to work at [employer]. Her husband Alison [name] works for [employer] division at a [employer] their son Alison [name] is currently at home.    ADVANCED DIRECTIVES: Not in place   HEALTH MAINTENANCE: Social History  Substance Use Topics  . Smoking status: Former Smoker -- 1.00 packs/day    Types: Cigarettes    Quit date: 11/25/2008  . Smokeless tobacco: Not on file  . Alcohol Use: No     Colonoscopy: April 2014/ Raynaldo Opitz at [facility]  PAP: s/p hysterectomy  Bone density: August 2016/ osteopenia  Lipid panel:  Allergies  Allergen Reactions  . Sulfa Antibiotics Other (See Comments)    Blisters in mouth    Current  Outpatient Prescriptions  Medication Sig Dispense Refill  . cetirizine (ZYRTEC) 10 MG tablet Take 10 mg by mouth daily.    . Cholecalciferol (VITAMIN D3) 2000 UNITS capsule Take 2,000 Units by mouth daily.     Marland Kitchen dextroamphetamine (DEXEDRINE SPANSULE) 15 MG 24 hr capsule Take 15 mg by mouth 2 (two) times daily.     Marland Kitchen doxycycline (VIBRA-TABS) 100 MG tablet Take 1 tablet (100 mg total) by mouth daily. 30 tablet 1  . eszopiclone (LUNESTA) 2 MG TABS tablet Take 1 tablet (2 mg total) by mouth at bedtime. Take immediately before bedtime 30 tablet 0  . fenofibrate (TRICOR) 145 MG tablet Take by mouth.    . Fenofibric Acid 105 MG TABS Take 1 tablet by mouth daily.    . fluconazole (DIFLUCAN) 100 MG tablet TAKE ONE TABLET BY MOUTH ONCE DAILY 30 tablet 1  . fluticasone (FLONASE) 50 MCG/ACT nasal spray Place 2 sprays into both nostrils daily. Reported on 11/18/2015    . gabapentin (NEURONTIN) 300 MG capsule Take 300 mg by mouth 3 (three) times daily.     Marland Kitchen ibuprofen (ADVIL,MOTRIN) 200 MG tablet Take by mouth.    Vanessa Kick Ethyl (VASCEPA) 1 G  CAPS Take 2 capsules by mouth 2 (two) times daily.    . Multiple Vitamin (MULTIVITAMIN) tablet Take 1 tablet by mouth daily.    Marland Kitchen NIFEdipine (PROCARDIA-XL/ADALAT-CC/NIFEDICAL-XL) 30 MG 24 hr tablet Take 30 mg by mouth daily.     Marland Kitchen olmesartan (BENICAR) 20 MG tablet Take 1 tablet (20 mg total) by mouth daily. 90 tablet 4  . omeprazole (PRILOSEC) 40 MG capsule TAKE ONE CAPSULE EACH DAY 30 capsule 2  . ondansetron (ZOFRAN) 8 MG tablet Take 1 tablet (8 mg total) by mouth every 8 (eight) hours as needed for nausea or vomiting. 30 tablet 0  . Probiotic Product (PROBIOTIC & ACIDOPHILUS EX ST PO) Take by mouth.    . prochlorperazine (COMPAZINE) 10 MG tablet Take 1 tablet (10 mg total) by mouth every 6 (six) hours as needed (Nausea or vomiting). 30 tablet 3  . traMADol (ULTRAM) 50 MG tablet Take by mouth every 6 (six) hours as needed.    . clobetasol cream (TEMOVATE) 7.16 %  Apply 1 application topically 2 (two) times daily. (Patient not taking: Reported on 12/30/2015) 30 g 0  . polyethylene glycol (MIRALAX / GLYCOLAX) packet Take 17 g by mouth daily. Reported on 12/30/2015    . Probiotic Product (PROBIOTIC DAILY PO) Take 1 capsule by mouth daily. Reported on 12/30/2015    . valACYclovir (VALTREX) 500 MG tablet TAKE ONE TABLET TWICE DAILY 30 tablet 1   No current facility-administered medications for this visit.    OBJECTIVE: Middle-aged white woman in no acute distress Filed Vitals:   12/30/15 1317  BP: 118/56  Pulse: 108  Temp: 97.7 F (36.5 C)  Resp: 18     Body mass index is 27.52 kg/(m^2).    ECOG FS:1 - Symptomatic but completely ambulatory  Skin: warm, dry  HEENT: sclerae anicteric, conjunctivae pink, oropharynx clear. No thrush or mucositis.  Lymph Nodes: No cervical or supraclavicular lymphadenopathy  Lungs: clear to auscultation bilaterally, no rales, wheezes, or rhonci  Heart: regular rate and rhythm  Abdomen: round, soft, non tender, positive bowel sounds  Musculoskeletal: No focal spinal tenderness, no peripheral edema  Neuro: non focal, well oriented, positive affect  Breasts: deferred  LAB RESULTS:  CMP     Component Value Date/Time   NA 136 12/30/2015 1259   NA 138 11/12/2015 0930   K 4.5 12/30/2015 1259   K 4.3 11/12/2015 0930   CL 108 11/12/2015 0930   CO2 22 12/30/2015 1259   CO2 22 11/12/2015 0930   GLUCOSE 122 12/30/2015 1259   GLUCOSE 97 11/12/2015 0930   BUN 15.2 12/30/2015 1259   BUN 10 11/12/2015 0930   CREATININE 1.0 12/30/2015 1259   CREATININE 0.58 11/12/2015 0930   CALCIUM 9.9 12/30/2015 1259   CALCIUM 9.3 11/12/2015 0930   PROT 6.7 12/30/2015 1259   PROT 7.1 11/12/2015 0930   ALBUMIN 3.8 12/30/2015 1259   ALBUMIN 4.0 11/12/2015 0930   AST 42* 12/30/2015 1259   AST 23 11/12/2015 0930   ALT 34 12/30/2015 1259   ALT 18 11/12/2015 0930   ALKPHOS 33* 12/30/2015 1259   ALKPHOS 37* 11/12/2015 0930   BILITOT 0.45  12/30/2015 1259   BILITOT 0.8 11/12/2015 0930   GFRNONAA >60 11/12/2015 0930   GFRAA >60 11/12/2015 0930    INo results found for: SPEP, UPEP  Lab Results  Component Value Date   WBC 6.1 12/30/2015   NEUTROABS 3.9 12/30/2015   HGB 11.7 12/30/2015   HCT 35.6 12/30/2015   MCV  98.6 12/30/2015   PLT 329 12/30/2015      Chemistry      Component Value Date/Time   NA 136 12/30/2015 1259   NA 138 11/12/2015 0930   K 4.5 12/30/2015 1259   K 4.3 11/12/2015 0930   CL 108 11/12/2015 0930   CO2 22 12/30/2015 1259   CO2 22 11/12/2015 0930   BUN 15.2 12/30/2015 1259   BUN 10 11/12/2015 0930   CREATININE 1.0 12/30/2015 1259   CREATININE 0.58 11/12/2015 0930      Component Value Date/Time   CALCIUM 9.9 12/30/2015 1259   CALCIUM 9.3 11/12/2015 0930   ALKPHOS 33* 12/30/2015 1259   ALKPHOS 37* 11/12/2015 0930   AST 42* 12/30/2015 1259   AST 23 11/12/2015 0930   ALT 34 12/30/2015 1259   ALT 18 11/12/2015 0930   BILITOT 0.45 12/30/2015 1259   BILITOT 0.8 11/12/2015 0930       No results found for: LABCA2  No components found for: LABCA125  No results for input(s): INR in the last 168 hours.  Urinalysis    Component Value Date/Time   COLORURINE YELLOW 11/12/2015 [place] 11/12/2015 0837   LABSPEC 1.006 11/12/2015 0837   PHURINE 6.5 11/12/2015 0837   GLUCOSEU NEGATIVE 11/12/2015 0837   HGBUR MODERATE* 11/12/2015 0837   [place] 11/12/2015 0837   [place] 11/12/2015 0837   PROTEINUR NEGATIVE 11/12/2015 0837   NITRITE NEGATIVE 11/12/2015 0837   LEUKOCYTESUR MODERATE* 11/12/2015 0837    STUDIES: No results found.  ASSESSMENT: 55 y.o. [place] woman status post 2 separate masses in the left breast 08/04/2015, clinically mT2 N1, stage IIb/IIIa invasive ductal carcinoma, grade 3, the larger mass being estrogen and progesterone receptor positive, HER-2 negative, with an MIB-1 of 12%  (a) biopsy of a left axillary lymph node  08/09/2015 was positive, with extracapsular extension  (1) neoadjuvant chemotherapy started 08/25/2015 consisting of doxorubicin and cyclophosphamide in dose dense fashion 4, completed 10/06/2015,  followed by weekly paclitaxel 12  (2) definitive surgery to follow  (3) adjuvant radiation to follow surgery  (4) anti-estrogens to follow completion of local treatment  (a) bone density August 2016 showed osteopenia  (b) the patient is status post total abdominal hysterectomy with bilateral salpingo-oophorectomy.  (5) the patient appears to be a good candidate for the [facility] trial  PLAN: Given how poorly Alison [name] feels, it would be prudent to hold treatment today. She understands that if her neuropathy symptoms have not improved tremendously by next week, we will likely discontinue this chemo regimen early. Fortunately she has completed the bulk of her treatments with 10 full cycles.   For her pain she is already maxed out on her tramadol, NSIADs, and gabapentin use. I am giving her a limited quantity of norco for use over the next week. She understands this will not be refilled. I believe she is having some acute form of taxane related joint pain that some of our patients experience.  Alison [name] will return in 1 week for cycle 11 of treatment. She understands and agrees with this plan. She knows the goal of treatment in her case is cure. She has been encouraged to call with any issues that might arise before her next visit here.   Total time spent in appointment was 25 minutes with greater than 50% of the time spent face to face with the patient.    Laurie Panda, NP   12/30/2015 1:33 PM

## 2016-01-05 ENCOUNTER — Other Ambulatory Visit: Payer: Self-pay | Admitting: Nurse Practitioner

## 2016-01-06 ENCOUNTER — Other Ambulatory Visit (HOSPITAL_BASED_OUTPATIENT_CLINIC_OR_DEPARTMENT_OTHER): Payer: 59

## 2016-01-06 ENCOUNTER — Encounter: Payer: Self-pay | Admitting: Nurse Practitioner

## 2016-01-06 ENCOUNTER — Encounter: Payer: Self-pay | Admitting: *Deleted

## 2016-01-06 ENCOUNTER — Ambulatory Visit (HOSPITAL_BASED_OUTPATIENT_CLINIC_OR_DEPARTMENT_OTHER): Payer: 59 | Admitting: Nurse Practitioner

## 2016-01-06 ENCOUNTER — Ambulatory Visit: Payer: 59

## 2016-01-06 ENCOUNTER — Other Ambulatory Visit: Payer: Self-pay | Admitting: *Deleted

## 2016-01-06 VITALS — BP 105/67 | HR 110 | Temp 98.4°F | Resp 18 | Ht 63.0 in | Wt 158.2 lb

## 2016-01-06 DIAGNOSIS — G4700 Insomnia, unspecified: Secondary | ICD-10-CM

## 2016-01-06 DIAGNOSIS — G620 Drug-induced polyneuropathy: Secondary | ICD-10-CM | POA: Diagnosis not present

## 2016-01-06 DIAGNOSIS — T451X5A Adverse effect of antineoplastic and immunosuppressive drugs, initial encounter: Secondary | ICD-10-CM

## 2016-01-06 DIAGNOSIS — C50412 Malignant neoplasm of upper-outer quadrant of left female breast: Secondary | ICD-10-CM

## 2016-01-06 LAB — CBC WITH DIFFERENTIAL/PLATELET
BASO%: 1.5 % (ref 0.0–2.0)
BASOS ABS: 0.1 10*3/uL (ref 0.0–0.1)
EOS%: 2.1 % (ref 0.0–7.0)
Eosinophils Absolute: 0.1 10*3/uL (ref 0.0–0.5)
HCT: 31.7 % — ABNORMAL LOW (ref 34.8–46.6)
HGB: 10.6 g/dL — ABNORMAL LOW (ref 11.6–15.9)
LYMPH%: 23.4 % (ref 14.0–49.7)
MCH: 32.6 pg (ref 25.1–34.0)
MCHC: 33.4 g/dL (ref 31.5–36.0)
MCV: 97.5 fL (ref 79.5–101.0)
MONO#: 0.7 10*3/uL (ref 0.1–0.9)
MONO%: 13.3 % (ref 0.0–14.0)
NEUT#: 3.2 10*3/uL (ref 1.5–6.5)
NEUT%: 59.7 % (ref 38.4–76.8)
NRBC: 0 % (ref 0–0)
Platelets: 371 10*3/uL (ref 145–400)
RBC: 3.25 10*6/uL — AB (ref 3.70–5.45)
RDW: 14.2 % (ref 11.2–14.5)
WBC: 5.4 10*3/uL (ref 3.9–10.3)
lymph#: 1.3 10*3/uL (ref 0.9–3.3)

## 2016-01-06 LAB — COMPREHENSIVE METABOLIC PANEL
ALK PHOS: 35 U/L — AB (ref 40–150)
ALT: 17 U/L (ref 0–55)
AST: 21 U/L (ref 5–34)
Albumin: 3.3 g/dL — ABNORMAL LOW (ref 3.5–5.0)
Anion Gap: 8 mEq/L (ref 3–11)
BUN: 9.1 mg/dL (ref 7.0–26.0)
CO2: 24 meq/L (ref 22–29)
Calcium: 9.4 mg/dL (ref 8.4–10.4)
Chloride: 107 mEq/L (ref 98–109)
Creatinine: 0.8 mg/dL (ref 0.6–1.1)
EGFR: 82 mL/min/{1.73_m2} — AB (ref >90)
GLUCOSE: 146 mg/dL — AB (ref 70–140)
POTASSIUM: 4.1 meq/L (ref 3.5–5.1)
SODIUM: 139 meq/L (ref 136–145)
TOTAL PROTEIN: 6.4 g/dL (ref 6.4–8.3)

## 2016-01-06 MED ORDER — ALPRAZOLAM 0.5 MG PO TABS: 0.5000 mg | ORAL_TABLET | ORAL | Status: DC | PRN

## 2016-01-06 MED ORDER — ESZOPICLONE 2 MG PO TABS: 2.0000 mg | ORAL_TABLET | Freq: Every day | ORAL | Status: DC

## 2016-01-06 NOTE — Progress Notes (Signed)
Per Orson Eva, patient will not receive treatment today.

## 2016-01-06 NOTE — Progress Notes (Signed)
[place]  [phone] [phone]   ID: Alison [name] DOB: 30-Aug-1960  MR#: [id number]  BDZ#:[id number]  Patient Care Team: Alison Pao, MD as PCP - General (Internal Medicine) Alison Luna, MD as Consulting Physician (General Surgery) Alison Cruel, MD as Consulting Physician (Oncology) Alison Silversmith, MD as Consulting Physician (Radiation Oncology) Alison Gell, MD as Consulting Physician (Obstetrics and Gynecology) Alison Sine, MD as Consulting Physician (Dermatology) PCP: Alison Pao, MD OTHER MD:  CHIEF COMPLAINT: Estrogen receptor positive breast cancer  CURRENT TREATMENT: Neoadjuvant chemotherapy  BREAST CANCER HISTORY: From the original intake note:  Alison [name] had routine screening mammography with [facility] February 20 11/14/2014 showing a possible mass in the right breast. Diagnostic right mammography with right breast ultrasonography 11/01/2014 found the breast density to be category C. In the upper outer quadrant of the right breast there was a partially obscured mass which on physical exam was palpable as an area of thickening at the 10:00 position 8 cm from the nipple. Ultrasound showed multiple cysts in the upper outer right breast corresponding to the mass in question.  In November 2016 the patient felt a change in the upper left breast and brought it to her primary care physician's attention. Diagnostic left mammography with tomosynthesis and left breast ultrasonography at [facility] December 01/14/2015 found trabecular thickening throughout the left breast with skin thickening, but no circumscribed mass or suspicious calcifications. A rounded mass in the left axilla measured 6 mm. There was an additional mildly prominent lymph node in the left axilla which was what the patient had been palpating. Ultrasonography showed ill-defined swelling throughout the inner left breast with overlying  skin thickening.. At the 2:00 position 4 cm from the nipple there was an irregular hypoechoic mass measuring 2.3 cm. A second mass at the 1:00 area 3 cm from the nipple measured 1 cm. The distance between these 2 masses was 1.8 cm. There was also a round hypoechoic left axillary mass measuring 0.6 cm. Overall the was an area of 3.7 cm of mixed echogenicity corresponding to the area of palpable soft tissue swelling.   On 08/04/2015 the patient underwent biopsy of the mass at the 2:00 axis 4 cm from the nipple and a second biopsy was performed of the hypoechoic mass at the 1:00 position 3 cm from the nipple. Biopsy of the suspicious nodule in the lower left axilla was attempted but could not be completed as the mass became obscured after administration of lidocaine.  The pathology from both left breast biopsies 08/04/2015 ([id number]) showed invasive ductal carcinoma, grade 3. The prognostic profile obtained from the larger tumor showed estrogen receptor to be strongly positive at 95%, progesterone receptor positive at 30%, with an MIB-1 of 12%, and no HER-2 amplification, the signals ratio being 1.22 and the number per cell 2.20.  On 08/09/2015 the patient underwent biopsy of this suspicious left axillary lymph node noted above, and this showed ([id number]) metastatic carcinoma with extracapsular extension.  The patient's subsequent history is as detailed below  INTERVAL HISTORY: Alison [name] for follow up of her estrogen receptor positive breast cancer, accompanied by her husband Alison [name]. Today is day 1, cycle 11 of 12 planned cycles of weekly paclitaxel. Last week the dose was held because of progressive neuropathy symptoms. They have improved some this week, but are still very present.   REVIEW OF SYSTEMS: Alison [name] is still fatigued. She has pain to her legs and knees managed  with alternating doses of tramadol and aleve. She also takes gabapentin TID. She used norco on 2 nights. Her appetite is  still down, but she has gained 3lb. She denies fevers, chills, nausea, or vomiting. She is moving her bowels well. A detailed review of systems is otherwise stable.  PAST MEDICAL HISTORY: Past Medical History  Diagnosis Date  . Hypertension   . Raynaud's disease   . Skin cancer   . Rectovaginal fistula     repaired 2014  . Osteopenia determined by x-ray     PAST SURGICAL HISTORY: Past Surgical History  Procedure Laterality Date  . C-section    . Abdominal hysterectomy      with BSO  . Appendectomy    . Colon resection    . Hernia repair      FAMILY HISTORY Family History  Problem Relation Age of Onset  . Lung cancer Father    the patient's father died from lung cancer at the age of 76 in the setting of tobacco abuse. The patient's mother died at the age of 78 with pancreatic cancer. The patient had no siblings. There is no history of breast or ovarian cancer in the family.  GYNECOLOGIC HISTORY:  No LMP recorded. Patient has had a hysterectomy. Menarche age 55, first live birth age 64. The patient is GX P1. She is status post total hysterectomy with bilateral salpingo-oophorectomy. She has been on hormone replacement since that time and is tapering to off at the time of this dictation.   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 She used to work at [employer]. Her husband [name] works for [employer] division at a [employer] their son [name] is currently at home.    ADVANCED DIRECTIVES: Not in place   HEALTH MAINTENANCE: Social History  Substance Use Topics  . Smoking status: Former Smoker -- 1.00 packs/day    Types: Cigarettes    Quit date: 11/25/2008  . Smokeless tobacco: Not on file  . Alcohol Use: No     Colonoscopy: April 2014/ Raynaldo Opitz at [facility]  PAP: s/p hysterectomy  Bone density: August 2016/ osteopenia  Lipid panel:  Allergies  Allergen Reactions  . Sulfa Antibiotics Other (See Comments)    Blisters in mouth     Current Outpatient Prescriptions  Medication Sig Dispense Refill  . cetirizine (ZYRTEC) 10 MG tablet Take 10 mg by mouth daily.    . Cholecalciferol (VITAMIN D3) 2000 UNITS capsule Take 2,000 Units by mouth daily.     Marland Kitchen dextroamphetamine (DEXEDRINE SPANSULE) 15 MG 24 hr capsule Take 15 mg by mouth 2 (two) times daily.     . fenofibrate (TRICOR) 145 MG tablet Take by mouth.    . Fenofibric Acid 105 MG TABS Take 1 tablet by mouth daily.    . fluconazole (DIFLUCAN) 100 MG tablet TAKE ONE TABLET BY MOUTH ONCE DAILY 30 tablet 1  . fluticasone (FLONASE) 50 MCG/ACT nasal spray Place 2 sprays into both nostrils daily. Reported on 11/18/2015    . gabapentin (NEURONTIN) 300 MG capsule Take 300 mg by mouth 3 (three) times daily.     Marland Kitchen HYDROcodone-acetaminophen (NORCO) 5-325 MG tablet Take 1 tablet by mouth every 6 (six) hours as needed for moderate pain. 20 tablet 0  . Icosapent Ethyl (VASCEPA) 1 G CAPS Take 2 capsules by mouth 2 (two) times daily.    . Multiple Vitamin (MULTIVITAMIN) tablet Take 1 tablet by mouth daily.    Marland Kitchen NIFEdipine (PROCARDIA-XL/ADALAT-CC/NIFEDICAL-XL) 30 MG  24 hr tablet Take 30 mg by mouth daily.     Marland Kitchen olmesartan (BENICAR) 20 MG tablet Take 1 tablet (20 mg total) by mouth daily. 90 tablet 4  . omeprazole (PRILOSEC) 40 MG capsule TAKE ONE CAPSULE EACH DAY 30 capsule 2  . polyethylene glycol (MIRALAX / GLYCOLAX) packet Take 17 g by mouth daily. Reported on 01/06/2016    . Probiotic Product (PROBIOTIC DAILY PO) Take 1 capsule by mouth daily. Reported on 12/30/2015    . prochlorperazine (COMPAZINE) 10 MG tablet Take 1 tablet (10 mg total) by mouth every 6 (six) hours as needed (Nausea or vomiting). 30 tablet 3  . traMADol (ULTRAM) 50 MG tablet Take by mouth every 6 (six) hours as needed.    . valACYclovir (VALTREX) 500 MG tablet TAKE ONE TABLET TWICE DAILY 30 tablet 1  . ALPRAZolam (XANAX) 0.5 MG tablet Take 1 tablet (0.5 mg total) by mouth as needed for anxiety (take 1 tab 1 hour  prior to MRI. Take 1 tab prior to start of MRI). 3 tablet 0  . clobetasol cream (TEMOVATE) 5.05 % Apply 1 application topically 2 (two) times daily. (Patient not taking: Reported on 12/30/2015) 30 g 0  . doxycycline (VIBRA-TABS) 100 MG tablet Take 1 tablet (100 mg total) by mouth daily. (Patient not taking: Reported on 01/06/2016) 30 tablet 1  . eszopiclone (LUNESTA) 2 MG TABS tablet Take 1 tablet (2 mg total) by mouth at bedtime. Take immediately before bedtime 30 tablet 0  . ibuprofen (ADVIL,MOTRIN) 200 MG tablet Take by mouth. Reported on 01/06/2016    . ondansetron (ZOFRAN) 8 MG tablet Take 1 tablet (8 mg total) by mouth every 8 (eight) hours as needed for nausea or vomiting. (Patient not taking: Reported on 01/06/2016) 30 tablet 0   No current facility-administered medications for this visit.    OBJECTIVE: Middle-aged white woman in no acute distress Filed Vitals:   01/06/16 1340  BP: 105/67  Pulse: 110  Temp: 98.4 F (36.9 C)  Resp: 18     Body mass index is 28.03 kg/(m^2).    ECOG FS:1 - Symptomatic but completely ambulatory  Skin: warm, dry  HEENT: sclerae anicteric, conjunctivae pink, oropharynx clear. No thrush or mucositis.  Lymph Nodes: No cervical or supraclavicular lymphadenopathy  Lungs: clear to auscultation bilaterally, no rales, wheezes, or rhonci  Heart: regular rate and rhythm  Abdomen: round, soft, non tender, positive bowel sounds  Musculoskeletal: No focal spinal tenderness, no peripheral edema  Neuro: non focal, well oriented, positive affect  Breasts: deferred  LAB RESULTS:  CMP     Component Value Date/Time   NA 139 01/06/2016 1321   NA 138 11/12/2015 0930   K 4.1 01/06/2016 1321   K 4.3 11/12/2015 0930   CL 108 11/12/2015 0930   CO2 24 01/06/2016 1321   CO2 22 11/12/2015 0930   GLUCOSE 146* 01/06/2016 1321   GLUCOSE 97 11/12/2015 0930   BUN 9.1 01/06/2016 1321   BUN 10 11/12/2015 0930   CREATININE 0.8 01/06/2016 1321   CREATININE 0.58 11/12/2015  0930   CALCIUM 9.4 01/06/2016 1321   CALCIUM 9.3 11/12/2015 0930   PROT 6.4 01/06/2016 1321   PROT 7.1 11/12/2015 0930   ALBUMIN 3.3* 01/06/2016 1321   ALBUMIN 4.0 11/12/2015 0930   AST 21 01/06/2016 1321   AST 23 11/12/2015 0930   ALT 17 01/06/2016 1321   ALT 18 11/12/2015 0930   ALKPHOS 35* 01/06/2016 1321   ALKPHOS 37* 11/12/2015 0930  BILITOT <0.30 01/06/2016 1321   BILITOT 0.8 11/12/2015 0930   GFRNONAA >60 11/12/2015 0930   GFRAA >60 11/12/2015 0930    INo results found for: SPEP, UPEP  Lab Results  Component Value Date   WBC 5.4 01/06/2016   NEUTROABS 3.2 01/06/2016   HGB 10.6* 01/06/2016   HCT 31.7* 01/06/2016   MCV 97.5 01/06/2016   PLT 371 01/06/2016      Chemistry      Component Value Date/Time   NA 139 01/06/2016 1321   NA 138 11/12/2015 0930   K 4.1 01/06/2016 1321   K 4.3 11/12/2015 0930   CL 108 11/12/2015 0930   CO2 24 01/06/2016 1321   CO2 22 11/12/2015 0930   BUN 9.1 01/06/2016 1321   BUN 10 11/12/2015 0930   CREATININE 0.8 01/06/2016 1321   CREATININE 0.58 11/12/2015 0930      Component Value Date/Time   CALCIUM 9.4 01/06/2016 1321   CALCIUM 9.3 11/12/2015 0930   ALKPHOS 35* 01/06/2016 1321   ALKPHOS 37* 11/12/2015 0930   AST 21 01/06/2016 1321   AST 23 11/12/2015 0930   ALT 17 01/06/2016 1321   ALT 18 11/12/2015 0930   BILITOT <0.30 01/06/2016 1321   BILITOT 0.8 11/12/2015 0930       No results found for: LABCA2  No components found for: LABCA125  No results for input(s): INR in the last 168 hours.  Urinalysis    Component Value Date/Time   COLORURINE YELLOW 11/12/2015 [place] 11/12/2015 0837   LABSPEC 1.006 11/12/2015 0837   PHURINE 6.5 11/12/2015 0837   GLUCOSEU NEGATIVE 11/12/2015 0837   HGBUR MODERATE* 11/12/2015 0837   [place] 11/12/2015 0837   [place] 11/12/2015 0837   PROTEINUR NEGATIVE 11/12/2015 0837   NITRITE NEGATIVE 11/12/2015 0837   LEUKOCYTESUR MODERATE*  11/12/2015 0837    STUDIES: No results found.  ASSESSMENT: 55 y.o. [place] woman status post 2 separate masses in the left breast 08/04/2015, clinically mT2 N1, stage IIb/IIIa invasive ductal carcinoma, grade 3, the larger mass being estrogen and progesterone receptor positive, HER-2 negative, with an MIB-1 of 12%  (a) biopsy of a left axillary lymph node 08/09/2015 was positive, with extracapsular extension  (1) neoadjuvant chemotherapy started 08/25/2015 consisting of doxorubicin and cyclophosphamide in dose dense fashion 4, completed 10/06/2015,  followed by weekly paclitaxel 12  (2) definitive surgery to follow  (3) adjuvant radiation to follow surgery  (4) anti-estrogens to follow completion of local treatment  (a) bone density August 2016 showed osteopenia  (b) the patient is status post total abdominal hysterectomy with bilateral salpingo-oophorectomy.  (5) the patient appears to be a good candidate for the Alliance trial  PLAN: Jeannetta and I have come to the conclusion that it is best that we stop treatment at this time. 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   She is scheduled for a final breast MRI this upcoming Monday. She was given a small quantity of xanax to calm her nerves. Her lunesta was also refilled today.  Kindel will have surgery on 6/8 and follow up with Dr. [name] in late June. It is unclear whether or not she will require radiation. She understands and agrees with this plan. She knows the goal of treatment in her case is cure. She has been encouraged to call with any issues that might arise before her next visit here.    Laurie Panda, NP  01/06/2016 3:33 PM

## 2016-01-08 ENCOUNTER — Other Ambulatory Visit: Payer: Self-pay | Admitting: Oncology

## 2016-01-09 ENCOUNTER — Other Ambulatory Visit: Payer: Self-pay | Admitting: *Deleted

## 2016-01-09 ENCOUNTER — Ambulatory Visit (HOSPITAL_COMMUNITY)
Admission: RE | Admit: 2016-01-09 | Discharge: 2016-01-09 | Disposition: A | Discharge status: Home / Self Care | Payer: 59 | Source: Ambulatory Visit | Attending: Nurse Practitioner | Admitting: Nurse Practitioner

## 2016-01-09 DIAGNOSIS — C50412 Malignant neoplasm of upper-outer quadrant of left female breast: Secondary | ICD-10-CM | POA: Insufficient documentation

## 2016-01-09 DIAGNOSIS — R590 Localized enlarged lymph nodes: Secondary | ICD-10-CM | POA: Diagnosis not present

## 2016-01-09 MED ORDER — TRAMADOL HCL 50 MG PO TABS: 50.0000 mg | ORAL_TABLET | Freq: Four times a day (QID) | ORAL | Status: DC | PRN

## 2016-01-09 MED ORDER — GADOBENATE DIMEGLUMINE 529 MG/ML IV SOLN
15.0000 mL | Freq: Once | INTRAVENOUS | Status: AC | PRN
  Administered 2016-01-09: 14 mL via INTRAVENOUS

## 2016-01-12 ENCOUNTER — Encounter: Payer: Self-pay | Admitting: Oncology

## 2016-01-12 NOTE — Progress Notes (Signed)
[facility] needed notes-all faxed [phone]

## 2016-01-13 ENCOUNTER — Other Ambulatory Visit: Payer: Self-pay | Admitting: General Surgery

## 2016-01-17 ENCOUNTER — Other Ambulatory Visit: Payer: Self-pay | Admitting: Oncology

## 2016-01-25 ENCOUNTER — Other Ambulatory Visit: Payer: Self-pay | Admitting: Nurse Practitioner

## 2016-01-26 ENCOUNTER — Encounter (HOSPITAL_BASED_OUTPATIENT_CLINIC_OR_DEPARTMENT_OTHER): Payer: Self-pay | Admitting: *Deleted

## 2016-01-27 ENCOUNTER — Other Ambulatory Visit: Payer: Self-pay | Admitting: *Deleted

## 2016-01-27 MED ORDER — FLUCONAZOLE 100 MG PO TABS: 100.0000 mg | ORAL_TABLET | Freq: Every day | ORAL | Status: DC

## 2016-01-27 NOTE — Progress Notes (Signed)
Husband Alison [name] picked up wildberry 8 oz boost freeze for his wife.  Instructions given to him along with written instructions.  Teach back used.  Alison [name] voiced understanding and will instruct wife the same.  Also instructed this was the only liquid she could have after midnight except sip of water with morning meds .

## 2016-01-30 ENCOUNTER — Other Ambulatory Visit: Payer: Self-pay | Admitting: Plastic Surgery

## 2016-01-30 DIAGNOSIS — C50912 Malignant neoplasm of unspecified site of left female breast: Secondary | ICD-10-CM

## 2016-01-30 NOTE — H&P (Signed)
Alison [name] is an 55 y.o. female.   Chief Complaint: Left breast cancer HPI: The patient is a 55 yrs old wf here for breast reconstruction. 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 There are two lesions in the upper outer quadrant. She had chemo with favorable response. She felt a mass in the left breast and a lymph node on the left as the start of her diagnosis. She is 5 feet 3 inches tall, weighs 150 pounds, Preop bra= 38 B. She has a 24 yrs old son and is married with her husband here at the consultation and very supportive. She has a vertical abdominal incision from her previous surgeries. She is not a smoker now and is otherwise in reasonable health. She was interested is being slightly larger. She has now decided to have a mastectomy. 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 She is interested in symmetry for the right with an implant if possible later. She is otherwise doing well and not had any recent illnesses. MR showing negative nodes and response to the chemo. Her blood pressure is running low and her pressure mediations were adjusted.   History:    Past Medical History  Diagnosis Date  . Hypertension   . Raynaud's disease   . Skin cancer   . Rectovaginal fistula     repaired 2014  . Osteopenia determined by x-ray     Past Surgical History  Procedure Laterality Date  . C-section    . Abdominal hysterectomy      with BSO  . Appendectomy    . Colon resection    . Hernia repair      Family History  Problem Relation Age of Onset  . Lung cancer Father    Social History:  reports that she quit smoking about 7 years ago. Her smoking use included Cigarettes. She smoked 1.00 pack per day. She does not have any smokeless tobacco history on file. She reports that she does not drink alcohol or use illicit drugs.  Allergies:  Allergies  Allergen Reactions  . Sulfa Antibiotics Other (See Comments)   Blisters in mouth     (Not in a hospital admission)  No results found for this or any previous visit (from the past 48 hour(s)). No results found.  Review of Systems  Constitutional: Negative.   HENT: Negative.   Eyes: Negative.   Respiratory: Negative.   Cardiovascular: Negative.   Gastrointestinal: Negative.   Genitourinary: Negative.   Musculoskeletal: Negative.   Skin: Negative.   Neurological: Negative.   Psychiatric/Behavioral: Negative.     There were no vitals taken for this visit. Physical Exam  Constitutional: She appears well-developed and well-nourished.  HENT:  Head: Normocephalic and atraumatic.  Eyes: Conjunctivae and EOM are normal. Pupils are equal, round, and reactive to light.  Cardiovascular: Normal rate.   Respiratory: Effort normal.  GI: Soft.  Neurological: She is alert.  Skin: Skin is warm.  Psychiatric: She has a normal mood and affect. Her behavior is normal. Judgment and thought content normal.     Assessment/Plan Plan for immediate left breast reconstruction with expander and Flex HD placement. Will likely need right implant for symmetry later. If she has radiation then a latissimus may be required which she is aware.   [name], DO 01/30/2016, 8:13 PM

## 2016-02-01 ENCOUNTER — Other Ambulatory Visit: Payer: Self-pay | Admitting: Nurse Practitioner

## 2016-02-02 ENCOUNTER — Ambulatory Visit
Admission: RE | Admit: 2016-02-02 | Discharge: 2016-02-02 | Disposition: A | Discharge status: Home / Self Care | Payer: 59 | Source: Ambulatory Visit | Attending: General Surgery | Admitting: General Surgery

## 2016-02-02 ENCOUNTER — Ambulatory Visit (HOSPITAL_BASED_OUTPATIENT_CLINIC_OR_DEPARTMENT_OTHER): Payer: 59 | Admitting: Anesthesiology

## 2016-02-02 ENCOUNTER — Encounter (HOSPITAL_BASED_OUTPATIENT_CLINIC_OR_DEPARTMENT_OTHER)
Admission: RE | Disposition: A | Discharge status: Home / Self Care | Payer: Self-pay | Source: Ambulatory Visit | Attending: Plastic Surgery

## 2016-02-02 ENCOUNTER — Other Ambulatory Visit: Payer: Self-pay | Admitting: General Surgery

## 2016-02-02 ENCOUNTER — Other Ambulatory Visit: Payer: Self-pay | Admitting: Oncology

## 2016-02-02 ENCOUNTER — Encounter (HOSPITAL_BASED_OUTPATIENT_CLINIC_OR_DEPARTMENT_OTHER): Payer: Self-pay | Admitting: *Deleted

## 2016-02-02 ENCOUNTER — Ambulatory Visit (HOSPITAL_BASED_OUTPATIENT_CLINIC_OR_DEPARTMENT_OTHER)
Admission: RE | Admit: 2016-02-02 | Discharge: 2016-02-03 | Disposition: A | Discharge status: Home / Self Care | Payer: 59 | Source: Ambulatory Visit | Attending: Plastic Surgery | Admitting: Plastic Surgery

## 2016-02-02 ENCOUNTER — Encounter (HOSPITAL_COMMUNITY)
Admission: RE | Admit: 2016-02-02 | Discharge: 2016-02-02 | Disposition: A | Discharge status: Home / Self Care | Payer: 59 | Source: Ambulatory Visit | Attending: General Surgery | Admitting: General Surgery

## 2016-02-02 DIAGNOSIS — I10 Essential (primary) hypertension: Secondary | ICD-10-CM | POA: Insufficient documentation

## 2016-02-02 DIAGNOSIS — Z791 Long term (current) use of non-steroidal anti-inflammatories (NSAID): Secondary | ICD-10-CM | POA: Insufficient documentation

## 2016-02-02 DIAGNOSIS — C50412 Malignant neoplasm of upper-outer quadrant of left female breast: Secondary | ICD-10-CM | POA: Diagnosis not present

## 2016-02-02 DIAGNOSIS — Z452 Encounter for adjustment and management of vascular access device: Secondary | ICD-10-CM | POA: Insufficient documentation

## 2016-02-02 DIAGNOSIS — Z170 Estrogen receptor positive status [ER+]: Secondary | ICD-10-CM | POA: Insufficient documentation

## 2016-02-02 DIAGNOSIS — C50912 Malignant neoplasm of unspecified site of left female breast: Secondary | ICD-10-CM

## 2016-02-02 DIAGNOSIS — C50919 Malignant neoplasm of unspecified site of unspecified female breast: Secondary | ICD-10-CM | POA: Diagnosis present

## 2016-02-02 DIAGNOSIS — C773 Secondary and unspecified malignant neoplasm of axilla and upper limb lymph nodes: Principal | ICD-10-CM

## 2016-02-02 DIAGNOSIS — Z7982 Long term (current) use of aspirin: Secondary | ICD-10-CM | POA: Diagnosis not present

## 2016-02-02 DIAGNOSIS — Z79899 Other long term (current) drug therapy: Secondary | ICD-10-CM | POA: Insufficient documentation

## 2016-02-02 DIAGNOSIS — Z7951 Long term (current) use of inhaled steroids: Secondary | ICD-10-CM | POA: Insufficient documentation

## 2016-02-02 DIAGNOSIS — Z9221 Personal history of antineoplastic chemotherapy: Secondary | ICD-10-CM | POA: Insufficient documentation

## 2016-02-02 DIAGNOSIS — Z87891 Personal history of nicotine dependence: Secondary | ICD-10-CM | POA: Insufficient documentation

## 2016-02-02 DIAGNOSIS — C50911 Malignant neoplasm of unspecified site of right female breast: Secondary | ICD-10-CM | POA: Diagnosis present

## 2016-02-02 HISTORY — PX: PORT-A-CATH REMOVAL: SHX5289

## 2016-02-02 HISTORY — PX: RADIOACTIVE SEED GUIDED AXILLARY SENTINEL LYMPH NODE: SHX6735

## 2016-02-02 HISTORY — PX: BREAST RECONSTRUCTION WITH PLACEMENT OF TISSUE EXPANDER AND FLEX HD (ACELLULAR HYDRATED DERMIS): SHX6295

## 2016-02-02 HISTORY — PX: BREAST SURGERY: SHX581

## 2016-02-02 HISTORY — DX: Adverse effect of unspecified anesthetic, initial encounter: T41.45XA

## 2016-02-02 SURGERY — RADIOACTIVE SEED GUIDED AXILLARY SENTINEL LYMPH NODE BIOPSY
Anesthesia: Regional | Site: Chest | Laterality: Right

## 2016-02-02 MED ORDER — OXYCODONE HCL 5 MG PO TABS: 5.0000 mg | ORAL_TABLET | Freq: Four times a day (QID) | ORAL | Status: DC | PRN

## 2016-02-02 MED ORDER — MIDAZOLAM HCL 2 MG/2ML IJ SOLN
INTRAMUSCULAR | Status: AC
  Filled 2016-02-02: qty 2

## 2016-02-02 MED ORDER — SODIUM CHLORIDE 0.9 % IJ SOLN
INTRAMUSCULAR | Status: AC
  Filled 2016-02-02: qty 10

## 2016-02-02 MED ORDER — LIDOCAINE-EPINEPHRINE (PF) 1 %-1:200000 IJ SOLN
INTRAMUSCULAR | Status: DC | PRN
  Administered 2016-02-02: 16 mL

## 2016-02-02 MED ORDER — VALACYCLOVIR HCL 500 MG PO TABS
500.0000 mg | ORAL_TABLET | Freq: Two times a day (BID) | ORAL | Status: DC
  Administered 2016-02-02: 500 mg via ORAL

## 2016-02-02 MED ORDER — LIDOCAINE HCL (CARDIAC) 20 MG/ML IV SOLN
INTRAVENOUS | Status: DC | PRN
  Administered 2016-02-02: 30 mg via INTRAVENOUS

## 2016-02-02 MED ORDER — ONDANSETRON HCL 4 MG/2ML IJ SOLN
INTRAMUSCULAR | Status: AC
  Filled 2016-02-02: qty 2

## 2016-02-02 MED ORDER — KCL IN DEXTROSE-NACL 20-5-0.45 MEQ/L-%-% IV SOLN
INTRAVENOUS | Status: DC
  Administered 2016-02-02: 16:00:00 via INTRAVENOUS
  Filled 2016-02-02: qty 1000

## 2016-02-02 MED ORDER — ZOLPIDEM TARTRATE 5 MG PO TABS: 5.0000 mg | ORAL_TABLET | Freq: Every evening | ORAL | Status: DC | PRN

## 2016-02-02 MED ORDER — METHYLENE BLUE 0.5 % INJ SOLN
INTRAVENOUS | Status: DC | PRN
  Administered 2016-02-02: 5 mL

## 2016-02-02 MED ORDER — ACETAMINOPHEN 500 MG PO TABS: 1000.0000 mg | ORAL_TABLET | Freq: Four times a day (QID) | ORAL | Status: DC

## 2016-02-02 MED ORDER — FENTANYL CITRATE (PF) 100 MCG/2ML IJ SOLN
INTRAMUSCULAR | Status: AC
  Filled 2016-02-02: qty 2

## 2016-02-02 MED ORDER — DIPHENHYDRAMINE HCL 12.5 MG/5ML PO ELIX: 12.5000 mg | ORAL_SOLUTION | Freq: Four times a day (QID) | ORAL | Status: DC | PRN

## 2016-02-02 MED ORDER — CEFAZOLIN SODIUM-DEXTROSE 2-4 GM/100ML-% IV SOLN
2.0000 g | Freq: Three times a day (TID) | INTRAVENOUS | Status: DC
  Administered 2016-02-02 – 2016-02-03 (×2): 2 g via INTRAVENOUS
  Filled 2016-02-02: qty 100

## 2016-02-02 MED ORDER — TECHNETIUM TC 99M SULFUR COLLOID FILTERED
1.0000 | Freq: Once | INTRAVENOUS | Status: AC | PRN
  Administered 2016-02-02: 1 via INTRADERMAL

## 2016-02-02 MED ORDER — CEFAZOLIN SODIUM-DEXTROSE 2-4 GM/100ML-% IV SOLN: 2.0000 g | INTRAVENOUS | Status: DC

## 2016-02-02 MED ORDER — HYDROCODONE-ACETAMINOPHEN 5-325 MG PO TABS
1.0000 | ORAL_TABLET | ORAL | Status: DC | PRN
  Administered 2016-02-02 (×2): 2 via ORAL
  Administered 2016-02-03: 1 via ORAL
  Administered 2016-02-03: 2 via ORAL
  Filled 2016-02-02: qty 1
  Filled 2016-02-02 (×3): qty 2

## 2016-02-02 MED ORDER — METHOCARBAMOL 500 MG PO TABS: 500.0000 mg | ORAL_TABLET | Freq: Four times a day (QID) | ORAL | Status: DC

## 2016-02-02 MED ORDER — PROPOFOL 10 MG/ML IV BOLUS
INTRAVENOUS | Status: DC | PRN
  Administered 2016-02-02: 150 mg via INTRAVENOUS

## 2016-02-02 MED ORDER — EPINEPHRINE HCL 1 MG/ML IJ SOLN
INTRAMUSCULAR | Status: AC
  Filled 2016-02-02: qty 1

## 2016-02-02 MED ORDER — LIDOCAINE-EPINEPHRINE (PF) 1 %-1:200000 IJ SOLN
INTRAMUSCULAR | Status: AC
  Filled 2016-02-02: qty 30

## 2016-02-02 MED ORDER — HYDROMORPHONE HCL 1 MG/ML IJ SOLN
0.2500 mg | INTRAMUSCULAR | Status: DC | PRN
  Administered 2016-02-02 (×2): 0.5 mg via INTRAVENOUS

## 2016-02-02 MED ORDER — ONDANSETRON HCL 4 MG/2ML IJ SOLN: 4.0000 mg | Freq: Four times a day (QID) | INTRAMUSCULAR | Status: DC | PRN

## 2016-02-02 MED ORDER — ONDANSETRON HCL 4 MG/2ML IJ SOLN
INTRAMUSCULAR | Status: DC | PRN
  Administered 2016-02-02: 4 mg via INTRAVENOUS

## 2016-02-02 MED ORDER — HYDROMORPHONE HCL 1 MG/ML IJ SOLN
INTRAMUSCULAR | Status: AC
  Filled 2016-02-02: qty 1

## 2016-02-02 MED ORDER — SENNA 8.6 MG PO TABS
1.0000 | ORAL_TABLET | Freq: Two times a day (BID) | ORAL | Status: DC
  Administered 2016-02-02: 8.6 mg via ORAL
  Filled 2016-02-02: qty 1

## 2016-02-02 MED ORDER — PANTOPRAZOLE SODIUM 40 MG PO TBEC: 40.0000 mg | DELAYED_RELEASE_TABLET | Freq: Every day | ORAL | Status: DC

## 2016-02-02 MED ORDER — METHYLENE BLUE 0.5 % INJ SOLN
INTRAVENOUS | Status: AC
  Filled 2016-02-02: qty 10

## 2016-02-02 MED ORDER — FENTANYL CITRATE (PF) 100 MCG/2ML IJ SOLN
50.0000 ug | INTRAMUSCULAR | Status: DC | PRN
  Administered 2016-02-02: 100 ug via INTRAVENOUS

## 2016-02-02 MED ORDER — POLYETHYLENE GLYCOL 3350 17 G PO PACK: 17.0000 g | PACK | Freq: Every day | ORAL | Status: DC | PRN

## 2016-02-02 MED ORDER — NAPROXEN 500 MG PO TABS: 500.0000 mg | ORAL_TABLET | Freq: Two times a day (BID) | ORAL | Status: DC | PRN

## 2016-02-02 MED ORDER — DIPHENHYDRAMINE HCL 50 MG/ML IJ SOLN: 12.5000 mg | Freq: Four times a day (QID) | INTRAMUSCULAR | Status: DC | PRN

## 2016-02-02 MED ORDER — BUPIVACAINE HCL (PF) 0.25 % IJ SOLN
INTRAMUSCULAR | Status: AC
  Filled 2016-02-02: qty 30

## 2016-02-02 MED ORDER — CEFAZOLIN SODIUM-DEXTROSE 2-4 GM/100ML-% IV SOLN
INTRAVENOUS | Status: AC
  Filled 2016-02-02: qty 100

## 2016-02-02 MED ORDER — PHENYLEPHRINE HCL 10 MG/ML IJ SOLN
10.0000 mg | INTRAVENOUS | Status: DC | PRN
  Administered 2016-02-02: 40 ug/min via INTRAVENOUS

## 2016-02-02 MED ORDER — SCOPOLAMINE 1 MG/3DAYS TD PT72: 1.0000 | MEDICATED_PATCH | Freq: Once | TRANSDERMAL | Status: DC | PRN

## 2016-02-02 MED ORDER — LIDOCAINE-EPINEPHRINE 1 %-1:100000 IJ SOLN
INTRAMUSCULAR | Status: AC
  Filled 2016-02-02: qty 1

## 2016-02-02 MED ORDER — SUFENTANIL CITRATE 50 MCG/ML IV SOLN
INTRAVENOUS | Status: AC
  Filled 2016-02-02: qty 1

## 2016-02-02 MED ORDER — CEFAZOLIN SODIUM-DEXTROSE 2-4 GM/100ML-% IV SOLN
2.0000 g | INTRAVENOUS | Status: AC
  Administered 2016-02-02: 2 g via INTRAVENOUS

## 2016-02-02 MED ORDER — SUFENTANIL CITRATE 50 MCG/ML IV SOLN
INTRAVENOUS | Status: DC | PRN
  Administered 2016-02-02 (×2): 10 ug via INTRAVENOUS

## 2016-02-02 MED ORDER — GLYCOPYRROLATE 0.2 MG/ML IJ SOLN: 0.2000 mg | Freq: Once | INTRAMUSCULAR | Status: DC | PRN

## 2016-02-02 MED ORDER — OXYCODONE HCL 5 MG/5ML PO SOLN: 5.0000 mg | Freq: Once | ORAL | Status: DC | PRN

## 2016-02-02 MED ORDER — OXYCODONE HCL 5 MG PO TABS: 5.0000 mg | ORAL_TABLET | Freq: Once | ORAL | Status: DC | PRN

## 2016-02-02 MED ORDER — TRAMADOL HCL 50 MG PO TABS
50.0000 mg | ORAL_TABLET | Freq: Two times a day (BID) | ORAL | Status: DC
  Administered 2016-02-02: 50 mg via ORAL

## 2016-02-02 MED ORDER — MIDAZOLAM HCL 2 MG/2ML IJ SOLN
1.0000 mg | INTRAMUSCULAR | Status: DC | PRN
  Administered 2016-02-02: 1 mg via INTRAVENOUS
  Administered 2016-02-02: 2 mg via INTRAVENOUS

## 2016-02-02 MED ORDER — DEXAMETHASONE SODIUM PHOSPHATE 4 MG/ML IJ SOLN
INTRAMUSCULAR | Status: DC | PRN
  Administered 2016-02-02: 10 mg via INTRAVENOUS

## 2016-02-02 MED ORDER — BUPIVACAINE-EPINEPHRINE (PF) 0.25% -1:200000 IJ SOLN
INTRAMUSCULAR | Status: AC
  Filled 2016-02-02: qty 30

## 2016-02-02 MED ORDER — GABAPENTIN 300 MG PO CAPS
300.0000 mg | ORAL_CAPSULE | Freq: Three times a day (TID) | ORAL | Status: DC
  Administered 2016-02-02: 300 mg via ORAL

## 2016-02-02 MED ORDER — HYDROMORPHONE HCL 1 MG/ML IJ SOLN
1.0000 mg | INTRAMUSCULAR | Status: DC | PRN
  Administered 2016-02-02 – 2016-02-03 (×3): 1 mg via INTRAVENOUS
  Filled 2016-02-02 (×3): qty 1

## 2016-02-02 MED ORDER — BUPIVACAINE-EPINEPHRINE (PF) 0.5% -1:200000 IJ SOLN
INTRAMUSCULAR | Status: DC | PRN
  Administered 2016-02-02: 25 mL via PERINEURAL

## 2016-02-02 MED ORDER — EPHEDRINE SULFATE 50 MG/ML IJ SOLN
INTRAMUSCULAR | Status: DC | PRN
  Administered 2016-02-02 (×3): 10 mg via INTRAVENOUS

## 2016-02-02 MED ORDER — LACTATED RINGERS IV SOLN
INTRAVENOUS | Status: DC
  Administered 2016-02-02 (×3): via INTRAVENOUS

## 2016-02-02 SURGICAL SUPPLY — 102 items
ADH SKN CLS APL DERMABOND .7 (GAUZE/BANDAGES/DRESSINGS) ×2
APPLIER CLIP 9.375 MED OPEN (MISCELLANEOUS) ×4
APR CLP MED 9.3 20 MLT OPN (MISCELLANEOUS) ×2
BAG DECANTER FOR FLEXI CONT (MISCELLANEOUS) ×4 IMPLANT
BINDER BREAST LRG (GAUZE/BANDAGES/DRESSINGS) IMPLANT
BINDER BREAST MEDIUM (GAUZE/BANDAGES/DRESSINGS) IMPLANT
BINDER BREAST XLRG (GAUZE/BANDAGES/DRESSINGS) IMPLANT
BINDER BREAST XXLRG (GAUZE/BANDAGES/DRESSINGS) IMPLANT
BIOPATCH RED 1 DISK 7.0 (GAUZE/BANDAGES/DRESSINGS) ×2 IMPLANT
BIOPATCH RED 1IN DISK 7.0MM (GAUZE/BANDAGES/DRESSINGS)
BLADE HEX COATED 2.75 (ELECTRODE) ×8 IMPLANT
BLADE SURG 10 STRL SS (BLADE) ×4 IMPLANT
BLADE SURG 15 STRL LF DISP TIS (BLADE) ×4 IMPLANT
BLADE SURG 15 STRL SS (BLADE) ×12
BNDG COHESIVE 4X5 TAN STRL (GAUZE/BANDAGES/DRESSINGS) ×2 IMPLANT
BNDG GAUZE ELAST 4 BULKY (GAUZE/BANDAGES/DRESSINGS) ×8 IMPLANT
CANISTER SUC SOCK COL 7IN (MISCELLANEOUS) IMPLANT
CANISTER SUCT 1200ML W/VALVE (MISCELLANEOUS) ×8 IMPLANT
CHLORAPREP W/TINT 26ML (MISCELLANEOUS) ×6 IMPLANT
CLIP APPLIE 9.375 MED OPEN (MISCELLANEOUS) IMPLANT
CLIP TI LARGE 6 (CLIP) ×2 IMPLANT
CLIP TI MEDIUM 6 (CLIP) ×1 IMPLANT
CLIP TI WIDE RED SMALL 6 (CLIP) IMPLANT
CLOSURE WOUND 1/2 X4 (GAUZE/BANDAGES/DRESSINGS)
COVER BACK TABLE 60X90IN (DRAPES) ×4 IMPLANT
COVER MAYO STAND STRL (DRAPES) ×11 IMPLANT
COVER PROBE W GEL 5X96 (DRAPES) ×4 IMPLANT
DECANTER SPIKE VIAL GLASS SM (MISCELLANEOUS) IMPLANT
DERMABOND ADVANCED (GAUZE/BANDAGES/DRESSINGS) ×2
DERMABOND ADVANCED .7 DNX12 (GAUZE/BANDAGES/DRESSINGS) ×2 IMPLANT
DEVICE DISSECT PLASMABLAD 3.0S (MISCELLANEOUS) ×2 IMPLANT
DEVICE DUBIN W/COMP PLATE 8390 (MISCELLANEOUS) ×4 IMPLANT
DRAIN CHANNEL 19F RND (DRAIN) ×8 IMPLANT
DRAPE LAPAROSCOPIC ABDOMINAL (DRAPES) ×4 IMPLANT
DRAPE UTILITY XL STRL (DRAPES) ×4 IMPLANT
DRSG PAD ABDOMINAL 8X10 ST (GAUZE/BANDAGES/DRESSINGS) ×8 IMPLANT
ELECT BLADE 4.0 EZ CLEAN MEGAD (MISCELLANEOUS) ×8
ELECT REM PT RETURN 9FT ADLT (ELECTROSURGICAL) ×8
ELECTRODE BLDE 4.0 EZ CLN MEGD (MISCELLANEOUS) ×2 IMPLANT
ELECTRODE REM PT RTRN 9FT ADLT (ELECTROSURGICAL) ×4 IMPLANT
EVACUATOR SILICONE 100CC (DRAIN) ×8 IMPLANT
EXPANDER BREAST 275CC (Breast) ×2 IMPLANT
GLOVE BIO SURGEON STRL SZ 6 (GLOVE) ×4 IMPLANT
GLOVE BIO SURGEON STRL SZ 6.5 (GLOVE) ×7 IMPLANT
GLOVE BIO SURGEONS STRL SZ 6.5 (GLOVE) ×3
GLOVE BIOGEL PI IND STRL 6.5 (GLOVE) ×2 IMPLANT
GLOVE BIOGEL PI INDICATOR 6.5 (GLOVE) ×2
GLOVE SURG SS PI 7.0 STRL IVOR (GLOVE) ×4 IMPLANT
GOWN STRL REUS W/ TWL LRG LVL3 (GOWN DISPOSABLE) ×8 IMPLANT
GOWN STRL REUS W/TWL 2XL LVL3 (GOWN DISPOSABLE) ×4 IMPLANT
GOWN STRL REUS W/TWL LRG LVL3 (GOWN DISPOSABLE) ×24
GRAFT FLEX HD 4X16 THICK (Tissue Mesh) ×3 IMPLANT
ILLUMINATOR WAVEGUIDE N/F (MISCELLANEOUS) IMPLANT
IV NS 1000ML (IV SOLUTION)
IV NS 1000ML BAXH (IV SOLUTION) IMPLANT
IV NS 500ML (IV SOLUTION) ×4
IV NS 500ML BAXH (IV SOLUTION) ×2 IMPLANT
KIT FILL SYSTEM UNIVERSAL (SET/KITS/TRAYS/PACK) ×3 IMPLANT
KIT MARKER MARGIN INK (KITS) ×4 IMPLANT
LIGHT WAVEGUIDE WIDE FLAT (MISCELLANEOUS) ×2 IMPLANT
NDL HYPO 25X1 1.5 SAFETY (NEEDLE) ×1 IMPLANT
NDL SAFETY ECLIPSE 18X1.5 (NEEDLE) IMPLANT
NEEDLE HYPO 18GX1.5 SHARP (NEEDLE) ×4
NEEDLE HYPO 25X1 1.5 SAFETY (NEEDLE) ×4 IMPLANT
NS IRRIG 1000ML POUR BTL (IV SOLUTION) ×4 IMPLANT
PACK BASIN DAY SURGERY FS (CUSTOM PROCEDURE TRAY) ×8 IMPLANT
PACK UNIVERSAL I (CUSTOM PROCEDURE TRAY) ×4 IMPLANT
PENCIL BUTTON HOLSTER BLD 10FT (ELECTRODE) ×8 IMPLANT
PIN SAFETY STERILE (MISCELLANEOUS) ×8 IMPLANT
PLASMABLADE 3.0S (MISCELLANEOUS)
SET ASEPTIC TRANSFER (MISCELLANEOUS) ×4 IMPLANT
SLEEVE SCD COMPRESS KNEE MED (MISCELLANEOUS) ×8 IMPLANT
SPONGE GAUZE 4X4 12PLY STER LF (GAUZE/BANDAGES/DRESSINGS) ×2 IMPLANT
SPONGE LAP 18X18 X RAY DECT (DISPOSABLE) ×16 IMPLANT
STAPLER VISISTAT 35W (STAPLE) ×2 IMPLANT
STOCKINETTE IMPERVIOUS LG (DRAPES) ×2 IMPLANT
STRIP CLOSURE SKIN 1/2X4 (GAUZE/BANDAGES/DRESSINGS) ×2 IMPLANT
SUT ETHILON 2 0 FS 18 (SUTURE) ×4 IMPLANT
SUT MNCRL AB 4-0 PS2 18 (SUTURE) ×20 IMPLANT
SUT MON AB 3-0 SH 27 (SUTURE) ×4
SUT MON AB 3-0 SH27 (SUTURE) ×2 IMPLANT
SUT MON AB 5-0 PS2 18 (SUTURE) ×4 IMPLANT
SUT PDS 3-0 CT2 (SUTURE)
SUT PDS AB 2-0 CT2 27 (SUTURE) ×6 IMPLANT
SUT PDS II 3-0 CT2 27 ABS (SUTURE) IMPLANT
SUT SILK 2 0 SH (SUTURE) ×2 IMPLANT
SUT SILK 3 0 PS 1 (SUTURE) ×4 IMPLANT
SUT VIC AB 2-0 SH 27 (SUTURE) ×4
SUT VIC AB 2-0 SH 27XBRD (SUTURE) ×2 IMPLANT
SUT VIC AB 3-0 SH 27 (SUTURE) ×4
SUT VIC AB 3-0 SH 27X BRD (SUTURE) ×2 IMPLANT
SUT VIC AB 5-0 PS2 18 (SUTURE) IMPLANT
SUT VICRYL AB 2 0 TIE (SUTURE) ×2 IMPLANT
SUT VICRYL AB 2 0 TIES (SUTURE) ×4
SYR BULB IRRIGATION 50ML (SYRINGE) ×4 IMPLANT
SYR CONTROL 10ML LL (SYRINGE) ×6 IMPLANT
TOWEL OR 17X24 6PK STRL BLUE (TOWEL DISPOSABLE) ×12 IMPLANT
TOWEL OR NON WOVEN STRL DISP B (DISPOSABLE) ×4 IMPLANT
TUBE CONNECTING 20'X1/4 (TUBING) ×2
TUBE CONNECTING 20X1/4 (TUBING) ×6 IMPLANT
UNDERPAD 30X30 (UNDERPADS AND DIAPERS) ×6 IMPLANT
YANKAUER SUCT BULB TIP NO VENT (SUCTIONS) ×8 IMPLANT

## 2016-02-02 NOTE — Progress Notes (Signed)
Assisted Dr. Marcie Bal with left, ultrasound guided, pectoralis block. Side rails up, monitors on throughout procedure. See vital signs in flow sheet. Tolerated Procedure well.

## 2016-02-02 NOTE — Brief Op Note (Signed)
02/02/2016  12:20 PM  PATIENT:  Alison [name]  55 y.o. female  PRE-OPERATIVE DIAGNOSIS:  LEFT BREAST CANCER  POST-OPERATIVE DIAGNOSIS:  same  PROCEDURE:  Procedure(s): LEFT BREAST MASTECTOMY AND RADIOACTIVE SEED GUIDED LEFT SENTINEL LYMPH NODE BIOPSY  (Left) MASTECTOMY MODIFIED RADICAL (Left) IMMEDIATE LEFT BREAST RECONSTRUCTION WITH PLACEMENT OF TISSUE EXPANDER AND FLEX HD (ACELLULAR HYDRATED DERMIS) (Left)  SURGEON:  Surgeon(s) and Role: Panel 1:    * [name], MD - Primary  Panel 2:    * Loel Lofty Keevan Wolz, DO - Primary  PHYSICIAN ASSISTANT:  Shawn Rayburn, PA  ASSISTANTS: none   ANESTHESIA:   general  EBL:     BLOOD ADMINISTERED:none  DRAINS: (1) Jackson-Pratt drain(s) with closed bulb suction in the let breast pocket   LOCAL MEDICATIONS USED:  NONE  SPECIMEN:  No Specimen  DISPOSITION OF SPECIMEN:  N/A  COUNTS:  YES  TOURNIQUET:  * No tourniquets in log *  DICTATION: .Dragon Dictation  PLAN OF CARE: observation  PATIENT DISPOSITION:  PACU - hemodynamically stable.   Delay start of Pharmacological VTE agent (>24hrs) due to surgical blood loss or risk of bleeding: no

## 2016-02-02 NOTE — Transfer of Care (Signed)
Immediate Anesthesia Transfer of Care Note  Patient: Alison [name]  Procedure(s) Performed: Procedure(s): LEFT BREAST MASTECTOMY AND RADIOACTIVE SEED GUIDED LEFT SENTINEL LYMPH NODE BIOPSY  (Left) IMMEDIATE LEFT BREAST RECONSTRUCTION WITH PLACEMENT OF TISSUE EXPANDER AND FLEX HD (ACELLULAR HYDRATED DERMIS) (Left) REMOVAL PORT-A-CATH (Right)  Patient Location: PACU  Anesthesia Type:GA combined with regional for post-op pain  Level of Consciousness: awake, sedated and patient cooperative  Airway & Oxygen Therapy: Patient Spontanous Breathing and Patient connected to face mask oxygen  Post-op Assessment: Report given to RN and Post -op Vital signs reviewed and stable  Post vital signs: Reviewed and stable  Last Vitals:  Filed Vitals:   02/02/16 1055 02/02/16 1056  BP:    Pulse: 101 99  Temp:    Resp: 14 14    Last Pain:  Filed Vitals:   02/02/16 1057  PainSc: 5       Patients Stated Pain Goal: 3 ([id number] [phone])  Complications: No apparent anesthesia complications

## 2016-02-02 NOTE — Interval H&P Note (Signed)
History and Physical Interval Note:  02/02/2016 10:20 AM  Alison [name]  has presented today for surgery, with the diagnosis of LEFT BREAST CANCER  The various methods of treatment have been discussed with the patient and family. After consideration of risks, benefits and other options for treatment, the patient has consented to  Procedure(s): LEFT BREAST MASTECTOMY AND RADIOACTIVE SEED GUIDED LEFT SENTINEL LYMPH NODE BIOPSY and port a cath removal as a surgical intervention .  The patient's history has been reviewed, patient examined, no change in status, stable for surgery.  I have reviewed the patient's chart and labs.  Questions were answered to the patient's satisfaction.     Loriann Bosserman

## 2016-02-02 NOTE — Anesthesia Procedure Notes (Addendum)
Anesthesia Regional Block:  Pectoralis block  Pre-Anesthetic Checklist: ,, timeout performed, Correct Patient, Correct Site, Correct Laterality, Correct Procedure, Correct Position, site marked, Risks and benefits discussed,  Surgical consent,  Pre-op evaluation,  At surgeon's request and post-op pain management  Laterality: Left  Prep: chloraprep       Needles:  Injection technique: Single-shot  Needle Type: Echogenic Needle     Needle Length: 9cm 9 cm Needle Gauge: 21 and 21 G    Additional Needles:  Procedures: ultrasound guided (picture in chart) Pectoralis block Narrative:  Start time: 02/02/2016 10:30 AM End time: 02/02/2016 10:40 AM Injection made incrementally with aspirations every 5 mL.  Performed by: Personally  Anesthesiologist: HODIERNE, ADAM  Additional Notes: Pt tolerated the procedure well.   Procedure Name: LMA Insertion Date/Time: 02/02/2016 11:15 AM Performed by: Melynda Ripple D Pre-anesthesia Checklist: Patient identified, Emergency Drugs available, Suction available and Patient being monitored Patient Re-evaluated:Patient Re-evaluated prior to inductionOxygen Delivery Method: Circle system utilized Preoxygenation: Pre-oxygenation with 100% oxygen Intubation Type: IV induction Ventilation: Mask ventilation without difficulty LMA: LMA inserted LMA Size: 4.0 Number of attempts: 1 Airway Equipment and Method: Bite block Placement Confirmation: positive ETCO2 Tube secured with: Tape Dental Injury: Teeth and Oropharynx as per pre-operative assessment

## 2016-02-02 NOTE — Anesthesia Preprocedure Evaluation (Signed)
Anesthesia Evaluation  Patient identified by MRN, date of birth, ID band Patient awake    Reviewed: Allergy & Precautions, NPO status , Patient's Chart, lab work & pertinent test results  Airway Mallampati: II   Neck ROM: full    Dental   Pulmonary former smoker,    breath sounds clear to auscultation       Cardiovascular hypertension, + Peripheral Vascular Disease   Rhythm:regular Rate:Normal     Neuro/Psych  Neuromuscular disease    GI/Hepatic   Endo/Other    Renal/GU      Musculoskeletal   Abdominal   Peds  Hematology   Anesthesia Other Findings   Reproductive/Obstetrics                             Anesthesia Physical Anesthesia Plan  ASA: II  Anesthesia Plan: General and Regional   Post-op Pain Management:  Regional for Post-op pain   Induction: Intravenous  Airway Management Planned: LMA  Additional Equipment:   Intra-op Plan:   Post-operative Plan:   Informed Consent: I have reviewed the patients History and Physical, chart, labs and discussed the procedure including the risks, benefits and alternatives for the proposed anesthesia with the patient or authorized representative who has indicated his/her understanding and acceptance.     Plan Discussed with: CRNA, Anesthesiologist and Surgeon  Anesthesia Plan Comments:         Anesthesia Quick Evaluation

## 2016-02-02 NOTE — Interval H&P Note (Signed)
History and Physical Interval Note:  02/02/2016 12:20 PM  Alison [name]  has presented today for surgery, with the diagnosis of LEFT BREAST CANCER  The various methods of treatment have been discussed with the patient and family. After consideration of risks, benefits and other options for treatment, the patient has consented to  Procedure(s): LEFT BREAST MASTECTOMY AND RADIOACTIVE SEED GUIDED LEFT SENTINEL LYMPH NODE BIOPSY  (Left) MASTECTOMY MODIFIED RADICAL (Left) IMMEDIATE LEFT BREAST RECONSTRUCTION WITH PLACEMENT OF TISSUE EXPANDER AND FLEX HD (ACELLULAR HYDRATED DERMIS) (Left) as a surgical intervention .  The patient's history has been reviewed, patient examined, no change in status, stable for surgery.  I have reviewed the patient's chart and labs.  Questions were answered to the patient's satisfaction.     [name]

## 2016-02-02 NOTE — Anesthesia Postprocedure Evaluation (Signed)
Anesthesia Post Note  Patient: Alison [name]  Procedure(s) Performed: Procedure(s) (LRB): LEFT BREAST MASTECTOMY AND RADIOACTIVE SEED GUIDED LEFT SENTINEL LYMPH NODE BIOPSY  (Left) IMMEDIATE LEFT BREAST RECONSTRUCTION WITH PLACEMENT OF TISSUE EXPANDER AND FLEX HD (ACELLULAR HYDRATED DERMIS) (Left) REMOVAL PORT-A-CATH (Right)  Patient location during evaluation: PACU Anesthesia Type: General Level of consciousness: awake and alert and patient cooperative Pain management: pain level controlled Vital Signs Assessment: post-procedure vital signs reviewed and stable Respiratory status: spontaneous breathing and respiratory function stable Cardiovascular status: stable Anesthetic complications: no    Last Vitals:  Filed Vitals:   02/02/16 1530 02/02/16 1545  BP: 87/57 86/63  Pulse: 99 99  Temp:    Resp: 10 12    Last Pain:  Filed Vitals:   02/02/16 1550  PainSc: [place]

## 2016-02-02 NOTE — Op Note (Signed)
Op report    DATE OF OPERATION:  02/02/2016  LOCATION: [facility]  SURGICAL DIVISION: Plastic Surgery  PREOPERATIVE DIAGNOSES:  1. Left Breast cancer.    POSTOPERATIVE DIAGNOSES:  1. Left Breast cancer.   PROCEDURE:  1. Left immediate breast reconstruction with placement of Acellular Dermal Matrix and tissue expanders.  SURGEON: Krupa Stege Sanger Annaleia Pence, DO  ASSISTANT: Shawn Rayburn, PA  ANESTHESIA:  General.   COMPLICATIONS: None.   IMPLANTS: Left - Mentor 275cc. 50 ccof normal saline was placed in the expander Acellular Dermal Matrix 4 x 16 cm  INDICATIONS FOR PROCEDURE:  The patient, Alison [name], is a 55 y.o. female born on 08/21/1961, is here for  immediate first stage breast reconstruction with placement of left tissue expander and Acellular dermal matrix. MRN: [id number]  CONSENT:  Informed consent was obtained directly from the patient. Risks, benefits and alternatives were fully discussed. Specific risks including but not limited to bleeding, infection, hematoma, seroma, scarring, pain, implant infection, implant extrusion, capsular contracture, asymmetry, wound healing problems, and need for further surgery were all discussed. The patient did have an ample opportunity to have her questions answered to her satisfaction.   DESCRIPTION OF PROCEDURE:  The patient was taken to the operating room by the general surgery team. SCDs were placed and IV antibiotics were given. The patient's chest was prepped and draped in a sterile fashion. A time out was performed and the implants to be used were identified.  Left mastectomy was performed.  Once the general surgery team had completed their portion of the case the patient was rendered to the plastic and reconstructive surgery team.  The pectoralis major muscle was lifted from the chest wall with release of the lateral edge and lateral inframammary fold.  The pocket was irrigated with antibiotic solution and  hemostasis was achieved with electrocautery.  The ADM was then prepared according to the manufacture guidelines and slits placed to help with postoperative fluid management.  The ADM was then sutured to the inferior and lateral edge of the inframammary fold with 2-0 PDS starting with an interrupted stitch and then a running stitch.  The pectoralis muscle came loss from the inferior border so the ADM was placed starting at the medial aspect.  The lateral portion was sutured to with interrupted sutures after the expander was placed.  The expander was prepared according to the manufacture guidelines, the air evacuated and then it was placed under the ADM and pectoralis major muscle.  The inferior and lateral tabs were used to secure the expander to the chest wall with 2-0 PDS.  The drain was placed at the inframammary fold over the ADM and secured to the skin with 3-0 Silk.    The deep layers were closed with 3-0 Monocryl followed by 4-0 Monocryl.  The skin was closed with 5-0 Monocryl and then dermabond was applied.  The ABDs and breast binder were placed.  The patient tolerated the procedure well and there were no complications.  The patient was allowed to wake from anesthesia and taken to the recovery room in satisfactory condition.

## 2016-02-02 NOTE — Progress Notes (Signed)
Emotional support during breast injections °

## 2016-02-02 NOTE — H&P (View-Only) (Signed)
Alison [name] is an 55 y.o. female.   Chief Complaint: Left breast cancer HPI: The patient is a 55 yrs old wf here for breast reconstruction. 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 There are two lesions in the upper outer quadrant. She had chemo with favorable response. She felt a mass in the left breast and a lymph node on the left as the start of her diagnosis. She is 5 feet 3 inches tall, weighs 150 pounds, Preop bra= 38 B. She has a 102 yrs old son and is married with her husband here at the consultation and very supportive. She has a vertical abdominal incision from her previous surgeries. She is not a smoker now and is otherwise in reasonable health. She was interested is being slightly larger. She has now decided to have a mastectomy. 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 She is interested in symmetry for the right with an implant if possible later. She is otherwise doing well and not had any recent illnesses. MR showing negative nodes and response to the chemo. Her blood pressure is running low and her pressure mediations were adjusted.   History:    Past Medical History  Diagnosis Date  . Hypertension   . Raynaud's disease   . Skin cancer   . Rectovaginal fistula     repaired 2014  . Osteopenia determined by x-ray     Past Surgical History  Procedure Laterality Date  . C-section    . Abdominal hysterectomy      with BSO  . Appendectomy    . Colon resection    . Hernia repair      Family History  Problem Relation Age of Onset  . Lung cancer Father    Social History:  reports that she quit smoking about 7 years ago. Her smoking use included Cigarettes. She smoked 1.00 pack per day. She does not have any smokeless tobacco history on file. She reports that she does not drink alcohol or use illicit drugs.  Allergies:  Allergies  Allergen Reactions  . Sulfa Antibiotics Other (See Comments)   Blisters in mouth     (Not in a hospital admission)  No results found for this or any previous visit (from the past 48 hour(s)). No results found.  Review of Systems  Constitutional: Negative.   HENT: Negative.   Eyes: Negative.   Respiratory: Negative.   Cardiovascular: Negative.   Gastrointestinal: Negative.   Genitourinary: Negative.   Musculoskeletal: Negative.   Skin: Negative.   Neurological: Negative.   Psychiatric/Behavioral: Negative.     There were no vitals taken for this visit. Physical Exam  Constitutional: She appears well-developed and well-nourished.  HENT:  Head: Normocephalic and atraumatic.  Eyes: Conjunctivae and EOM are normal. Pupils are equal, round, and reactive to light.  Cardiovascular: Normal rate.   Respiratory: Effort normal.  GI: Soft.  Neurological: She is alert.  Skin: Skin is warm.  Psychiatric: She has a normal mood and affect. Her behavior is normal. Judgment and thought content normal.     Assessment/Plan Plan for immediate left breast reconstruction with expander and Flex HD placement. Will likely need right implant for symmetry later. If she has radiation then a latissimus may be required which she is aware.   [name], DO 01/30/2016, 8:13 PM

## 2016-02-02 NOTE — Op Note (Signed)
Left Mastectomy with Targeted axillary dissection with sentinel node biopsy, port removal  Indications: This patient presents with history of left breast cancer with positive axillary lymph node who underwent neoadjuvant chemotherapy.  The lymph nodes became clinically negative.  The previously positive LN was targeted with a radioactive seed to make sure it is retrieved.  Pre-operative Diagnosis: right breast cancer, [id number]  Post-operative Diagnosis: right breast cancer, same  Surgeon: [name]   Anesthesia: General endotracheal anesthesia and Local anesthesia 0.25.% bupivacaine, with epinephrine  ASA Class: 2  Procedure Details  The patient was seen in the Holding Room. The risks, benefits, complications, treatment options, and expected outcomes were discussed with the patient. The possibilities of reaction to medication, pulmonary aspiration, bleeding, infection, the need for additional procedures, failure to diagnose a condition, and creating a complication requiring transfusion or operation were discussed with the patient. The patient concurred with the proposed plan, giving informed consent.  The site of surgery properly noted/marked. The patient was taken to Operating Room # 1, identified, and the procedure verified as left  Mastectomy and targeted left Sentinel Node Biopsy, port removal. A Time Out was held and the above information confirmed.  The methylene blue was injected in the subareolar location.    After induction of anesthesia, the bilateral arm, breast, and chest were prepped and draped in standard fashion.  The port was addressed first.  The prior incision was anesthetized with local anesthetic.  The incision was opened with a #15 blade.  The subcutaneous tissue was divided with the cautery.  The port was identified and the capsule opened.    The port was then removed and pressure held on the tract.  The catheter appeared intact without evidence of breakage.  The wound was  inspected for hemostasis, which was achieved with cautery.  The wound was closed with 3-0 vicryl deep dermal interrupted sutures and 4-0 Monocryl running subcuticular suture.  The wound was cleaned, dried, and dressed with dermabond.   The borders of the breast were identified and marked.  The incisions of the breast were drawn out by Dr. Marla Roe. The superior incision was made with the #10 blade.    Mastectomy hooks were used to provide elevation of the skin edges, and the cautery was used to create the mastectomy flaps.  The dissection was taken to the fascia of the pectoralis major.  The penetrating vessels were clipped.  The superior flap was taken medially to the lateral sternal border, superiorly to the inferior border of the clavicle.  The inferior flap was similarly created, inferiorly to the inframammary fold and laterally to the border of the latissimus.  The breast was taken off including the pectoralis fascia and the axillary tail marked.    Using a hand-held gamma probe, axillary sentinel nodes were identified.  The original node with the clip and seed was identified with the probe, and it was fairly lateral.  The remaining axilla had high signal laterally in the skin.  The level 1 nodes were taken with a palpable node in them.  The findings are below.  The lymphovascular channels were clipped with metal clips.        The wound was irrigated.      Hemostasis was achieved with cautery.    The patient was left with Dr. Marla Roe for reconstruction.    Sterile dressings were applied. At the end of the operation, all sponge, instrument, and needle counts were correct.  Findings: grossly clear surgical margins.  Very vascular  breast.  Estimated Blood Loss:50 mL         Drains:  Per Dr. Marla Roe.                  Specimens: L breast and left axillary sentinel node and level 1 axillary contents.           Complications:  None; patient tolerated the procedure well.          Disposition: PACU - hemodynamically stable.         Condition: stable

## 2016-02-02 NOTE — H&P (Signed)
Alison [name] 01/12/2016 10:11 AM Location: [facility] Patient #: [id number] DOB: 1960-09-23 Married / Language: English / Race: White Female   History of Present Illness Alison Klein MD; 01/13/2016 1:04 PM) The patient is a 55 year old female who presents with breast cancer. PRIOR HISTORY [Pt is a 55 yo F who presented in November 2016 with a change in her upper left breast felt at the end of a shower. She had some swelling and went for dx imaging. She was found to have two areas in the left upper outer quadrant 1.8 cm apart and trabecular thickening. The two masses were 2.3 cm at 2 o'clock and 1 cm at 1 o'clock. The axillary lymph node was 6 mm. All were biopsies and showed invasive ductal carcinoma, g 3 wtih associated DCIS, ER/PR +, Her 2 negative. Proliferation was 12%. She has been undergoing chemotherapy. She did well with AC, but is very fatigued with the taxol. She had originally seen Dr. Brantley Stage, but wanted to switch based on discussions at chemotherapy. She was originally thinking she would need a mastectomy, but now is leaning toward lumpectomy if possible. ]  REPEAT MRI RECOMMENDATION: 1. The MRI findings were discussed by telephone with Dr. Barry Dienes today. We discussed the presence of suspicious enhancement involving all 4 quadrants of the breast, suspicious for multicentric disease. At this point, malignancy is only biopsy proven in the upper outer quadrant.  2. Schedule TWO MRI-guided biopsies of the LEFT breast. In our discussion today, Dr. Barry Dienes would like the patient to have two additional MRI guided biopsies to prove the extent of disease. We discussed that biopsies could be performed of suspicious enhancement in the lower central/slightly outer left breast as seen on image 88 of 248 on the post contrast images, and of suspicious enhancement in the upper inner quadrant of the right breast, such as that seen on image number 140 of 258.   The  patient has thought about this and discussed with her husband. She would like to just proceed with mastectomy. She has also reviewed the alliance trial. She does not want to participate in that. She had a bx proven + LN pre chemo, but this is negative on MR at this time. She would like to do "just some of the nodes if possible." She has had a hard time with chemo, being fatigued, and having to delay a bit for a few tx. She has also had to abort a few tx.      Allergies Elbert Ewings, CMA; 01/12/2016 10:11 AM) Sulfa Antibiotics blisters  Medication History Elbert Ewings, CMA; 01/12/2016 10:12 AM) Benicar (20MG  Tablet, Oral) Active. Tricor (145MG  Tablet, Oral) Active. Vivelle (0.1MG /24HR Patch TW, Transdermal) Active. NIFEdipine ER (30MG  Tablet ER 24HR, Oral) Active. Dextroamphetamine Sulfate (15MG  Tablet, Oral) Active. Vitamin D3 (2000UNIT Capsule, 2 Oral daily) Active. Calcium 600 (1500MG  Tablet, Oral) Active. Aspirin EC (81MG  Tablet DR, Oral) Active. Probiotic (Oral) Active. MiraLax (3 capfulls Oral daily) Active. TraMADol HCl (50MG  Tablet, Oral) Active. Medications Reconciled    Review of Systems Alison Klein MD; 01/13/2016 1:04 PM) All other systems negative  Vitals Elbert Ewings CMA; 01/12/2016 10:13 AM) 01/12/2016 10:12 AM Weight: 155 lb Height: 63in Body Surface Area: 1.74 m Body Mass Index: 27.46 kg/m  Temp.: 97.58F(Temporal)  BP: 126/72 (Sitting, Left Arm, Standard)       Physical Exam Alison Klein MD; 01/13/2016 1:05 PM) General Mental Status-Alert. General Appearance-Consistent with stated age. Hydration-Well hydrated. Voice-Normal.  Head and Neck Note: alopecia  as expected   Chest and Lung Exam Chest and lung exam reveals -quiet, even and easy respiratory effort with no use of accessory muscles. Inspection Chest Wall - Normal. Back - normal.  Breast Note: no palpable mass in either breast. symmetric bilaterally.  left axilla with palpable LN, but smaller. no nipple retraction, no skin erythema or dimpling.     Assessment & Plan Alison Klein MD; 01/13/2016 1:08 PM) CARCINOMA OF LEFT BREAST METASTATIC TO AXILLARY LYMPH NODE (C50.912) Impression: Will plan left mastectomy with seed targeted SLN bx.  Pt desires up front reconstruction if possible. I will send her back to see Dr. Marla Roe to discuss. I think she has at least a 50% risk of needing radiation. i will communicate with Dr. Pablo Ledger as well to discuss this.  Reviewed risks of surgery including bleeding, infection, possible need for additional surgery or procedures, heart or lung problems, possible recurrent cancer or positive margins, possible post op pain or chest wall lymphedema, blood clot, and others.  She wants to proceed. She is currently scheduled for 6/8, and if she has just mastectomy, we can adjust time, but if she is going to get up front surgery, we will need to change date.  30 min spent in evaluation, examination, counseling, and coordination of care. >50% spent in counseling. Current Plans Pt Education - [facility] Mastectomy HCI You are being scheduled for surgery - Our schedulers will call you.  You should hear from our office's scheduling department within 5 working days about the location, date, and time of surgery. We try to make accommodations for patient's preferences in scheduling surgery, but sometimes the OR schedule or the surgeon's schedule prevents [place] from making those accommodations.  If you have not heard from our office [phone]) in 5 working days, call the office and ask for your surgeon's nurse.  If you have other questions about your diagnosis, plan, or surgery, call the office and ask for your surgeon's nurse.  Pt Education - [facility] Free Text Education/Instructions: discussed with patient and provided information.   Signed by Alison Klein, MD (01/13/2016 1:08 PM)

## 2016-02-02 NOTE — Discharge Instructions (Addendum)
[facility] °[phone] ° °MASTECTOMY: POST OP INSTRUCTIONS ° °Always review your discharge instruction sheet given to you by the facility where your surgery was performed. °IF YOU HAVE DISABILITY OR FAMILY LEAVE FORMS, YOU MUST BRING THEM TO THE OFFICE FOR PROCESSING.   °DO NOT GIVE THEM TO YOUR DOCTOR. °A prescription for pain medication may be given to you upon discharge.  Take your pain medication as prescribed, if needed.  If narcotic pain medicine is not needed, then you may take acetaminophen (Tylenol) or ibuprofen (Advil) as needed. °1. Take your usually prescribed medications unless otherwise directed. °2. If you need a refill on your pain medication, please contact your pharmacy.  They will contact our office to request authorization.  Prescriptions will not be filled after 5pm or on week-ends. °3. You should follow a light diet the first few days after arrival home, such as soup and crackers, etc.  Resume your normal diet the day after surgery. °4. Most patients will experience some swelling and bruising on the chest and underarm.  Ice packs will help.  Swelling and bruising can take several days to resolve.  °5. It is common to experience some constipation if taking pain medication after surgery.  Increasing fluid intake and taking a stool softener (such as Colace) will usually help or prevent this problem from occurring.  A mild laxative (Milk of Magnesia or Miralax) should be taken according to package instructions if there are no bowel movements after 48 hours. °6. Unless discharge instructions indicate otherwise, leave your bandage dry and in place until your next appointment in 3-5 days.  You may take a limited sponge bath.  No tube baths or showers until the drains are removed.  You may have steri-strips (small skin tapes) in place directly over the incision.  These strips should be left on the skin for 7-10 days.  If your surgeon used skin glue on the incision, you may  shower in 24 hours.  The glue will flake off over the next 2-3 weeks.  Any sutures or staples will be removed at the office during your follow-up visit. °7. DRAINS:  If you have drains in place, it is important to keep a list of the amount of drainage produced each day in your drains.  Before leaving the hospital, you should be instructed on drain care.  Call our office if you have any questions about your drains. °8. ACTIVITIES:  You may resume regular (light) daily activities beginning the next day--such as daily self-care, walking, climbing stairs--gradually increasing activities as tolerated.  You may have sexual intercourse when it is comfortable.  Refrain from any heavy lifting or straining until approved by your doctor. °a. You may drive when you are no longer taking prescription pain medication, you can comfortably wear a seatbelt, and you can safely maneuver your car and apply brakes. °b. RETURN TO WORK:  __________________________________________________________ °9. You should see your doctor in the office for a follow-up appointment approximately 3-5 days after your surgery.  Your doctor’s nurse will typically make your follow-up appointment when she calls you with your pathology report.  Expect your pathology report 2-3 business days after your surgery.  You may call to check if you do not hear from us after three days.   °10. OTHER INSTRUCTIONS: ______________________________________________________________________________________________ ____________________________________________________________________________________________ ° °WHEN TO CALL YOUR DOCTOR: °1. Fever over 101.0 °2. Nausea and/or vomiting °3. Extreme swelling or bruising °4. Continued bleeding from incision. °5. Increased pain, redness, or drainage from the   incision. ° °The clinic staff is available to answer your questions during regular business hours.  Please don’t hesitate to call and ask to speak to one of the nurses for clinical  concerns.  If you have a medical emergency, go to the nearest emergency room or call 911.  A surgeon from [facility] is always on call at the hospital. °[address], [address], [place], [place]  [address] ? [address] [address], [place], [place]   [address] °[phone] ? [phone] ? FAX [phone] °Web site: [facility] ° °About my Jackson-Pratt Bulb Drain ° °What is a Jackson-Pratt bulb? °A Jackson-Pratt is a soft, round device used to collect drainage. It is connected to a long, thin drainage catheter, which is held in place by one or two small stiches near your surgical incision site. When the bulb is squeezed, it forms a vacuum, forcing the drainage to empty into the bulb. ° °Emptying the Jackson-Pratt bulb- °To empty the bulb: °1. Release the plug on the top of the bulb. °2. Pour the bulb's contents into a measuring container which your nurse will provide. °3. Record the time emptied and amount of drainage. Empty the drain(s) as often as your     doctor or nurse recommends. ° °Date                  Time                    Amount (Drain 1)                 Amount (Drain 2) ° °_____________________________________________________________________ ° °_____________________________________________________________________ ° °_____________________________________________________________________ ° °_____________________________________________________________________ ° °_____________________________________________________________________ ° °_____________________________________________________________________ ° °_____________________________________________________________________ ° °_____________________________________________________________________ ° °Squeezing the Jackson-Pratt Bulb- °To squeeze the bulb: °1. Make sure the plug at the top of the bulb is open. °2. Squeeze the bulb tightly in your fist. You will hear air squeezing from the bulb. °3. Replace the plug while the bulb is squeezed. °4.  Use a safety pin to attach the bulb to your clothing. This will keep the catheter from     pulling at the bulb insertion site. ° °When to call your doctor- °Call your doctor if: °· Drain site becomes red, swollen or hot. °· You have a fever greater than 101 degrees F. °· There is oozing at the drain site. °· Drain falls out (apply a guaze bandage over the drain hole and secure it with tape). °· Drainage increases daily not related to activity patterns. (You will usually have more drainage when you are active than when you are resting.) °· Drainage has a bad odor. ° °

## 2016-02-03 ENCOUNTER — Encounter (HOSPITAL_BASED_OUTPATIENT_CLINIC_OR_DEPARTMENT_OTHER): Payer: Self-pay | Admitting: General Surgery

## 2016-02-03 ENCOUNTER — Other Ambulatory Visit: Payer: Self-pay | Admitting: *Deleted

## 2016-02-03 DIAGNOSIS — C50412 Malignant neoplasm of upper-outer quadrant of left female breast: Secondary | ICD-10-CM

## 2016-02-03 MED ORDER — PROCHLORPERAZINE MALEATE 10 MG PO TABS
10.0000 mg | ORAL_TABLET | Freq: Four times a day (QID) | ORAL | Status: DC | PRN
Start: 2016-02-03 — End: 2016-04-03

## 2016-02-03 MED ORDER — CEFAZOLIN SODIUM-DEXTROSE 2-4 GM/100ML-% IV SOLN
INTRAVENOUS | Status: AC
  Filled 2016-02-03: qty 100

## 2016-02-03 NOTE — Discharge Summary (Signed)
Physician Discharge Summary  Patient ID: Alison [name] MRN: [id number] DOB/AGE: Apr 19, 1961 55 y.o.  Admit date: 02/02/2016 Discharge date: 02/03/2016  Admission Diagnoses:  Discharge Diagnoses:  Active Problems:   Breast cancer [facility])   Discharged Condition: good  Hospital Course: The patient was taken to the OR and underwent removal of port-a-cath and left breast mastectomy.  She had an immediate reconstruction with Expander and FlexHD placement.  She was managed over night and has done very well.  Consults: None  Significant Diagnostic Studies: none  Treatments: surgery: mastectomy  Discharge Exam: Blood pressure 101/58, pulse 94, temperature 98.4 F (36.9 C), temperature source Oral, resp. rate 20, height 5\' 3"  (1.6 m), weight 68.947 kg (152 lb), SpO2 96 %. doing very well  Disposition: 01-Home or Self Care     Medication List    TAKE these medications        cetirizine 10 MG tablet  Commonly known as:  ZYRTEC  Take 10 mg by mouth daily.     eszopiclone 2 MG Tabs tablet  Commonly known as:  LUNESTA  Take 1 tablet (2 mg total) by mouth at bedtime. Take immediately before bedtime     fenofibrate 145 MG tablet  Commonly known as:  TRICOR  Take by mouth.     Fenofibric Acid 105 MG Tabs  Take 1 tablet by mouth daily.     fluconazole 100 MG tablet  Commonly known as:  DIFLUCAN  Take 1 tablet (100 mg total) by mouth daily.     fluticasone 50 MCG/ACT nasal spray  Commonly known as:  FLONASE  Place 2 sprays into both nostrils daily. Reported on 11/18/2015     gabapentin 300 MG capsule  Commonly known as:  NEURONTIN  Take 300 mg by mouth 3 (three) times daily.     ibuprofen 200 MG tablet  Commonly known as:  ADVIL,MOTRIN  Take by mouth. Reported on 01/06/2016     methocarbamol 500 MG tablet  Commonly known as:  ROBAXIN  Take 1 tablet (500 mg total) by mouth 4 (four) times daily.     multivitamin tablet  Take 1 tablet by mouth daily.     olmesartan 20 MG  tablet  Commonly known as:  BENICAR  Take 1 tablet (20 mg total) by mouth daily.     omeprazole 40 MG capsule  Commonly known as:  PRILOSEC  TAKE ONE CAPSULE EACH DAY     oxyCODONE 5 MG immediate release tablet  Commonly known as:  Oxy IR/ROXICODONE  Take 1-2 tablets (5-10 mg total) by mouth every 6 (six) hours as needed for moderate pain, severe pain or breakthrough pain.     polyethylene glycol packet  Commonly known as:  MIRALAX / GLYCOLAX  Take 17 g by mouth daily. Reported on 01/06/2016     PROBIOTIC DAILY PO  Take 1 capsule by mouth daily. Reported on 12/30/2015     prochlorperazine 10 MG tablet  Commonly known as:  COMPAZINE  Take 1 tablet (10 mg total) by mouth every 6 (six) hours as needed (Nausea or vomiting).     traMADol 50 MG tablet  Commonly known as:  ULTRAM  TAKE ONE TABLET EVERY 6 HOURS AS NEEDED     valACYclovir 500 MG tablet  Commonly known as:  VALTREX  TAKE ONE TABLET TWICE DAILY     Vitamin D3 2000 units capsule  Take 2,000 Units by mouth daily.           Follow-up Information  Follow up with [facility], MD In 2 weeks.   Specialty:  General Surgery   Contact information:   [address] [place] [address] [phone]       Follow up with [name], DO In 1 week.   Specialty:  Plastic Surgery   Contact information:   [place] [address] [id number]       Signed: [name] 02/03/2016, 9:23 AM

## 2016-02-06 ENCOUNTER — Other Ambulatory Visit: Payer: Self-pay | Admitting: Nurse Practitioner

## 2016-02-09 ENCOUNTER — Other Ambulatory Visit: Payer: Self-pay | Admitting: Oncology

## 2016-02-10 ENCOUNTER — Other Ambulatory Visit: Payer: Self-pay | Admitting: General Surgery

## 2016-02-13 ENCOUNTER — Ambulatory Visit: Payer: 59 | Admitting: Oncology

## 2016-02-13 ENCOUNTER — Encounter (HOSPITAL_COMMUNITY): Payer: Self-pay | Admitting: *Deleted

## 2016-02-13 ENCOUNTER — Other Ambulatory Visit: Payer: 59

## 2016-02-13 MED ORDER — CEFAZOLIN SODIUM-DEXTROSE 2-4 GM/100ML-% IV SOLN
2.0000 g | INTRAVENOUS | Status: AC
  Administered 2016-02-14: 2 g via INTRAVENOUS
  Filled 2016-02-13: qty 100

## 2016-02-14 ENCOUNTER — Ambulatory Visit (HOSPITAL_COMMUNITY): Payer: 59 | Admitting: Anesthesiology

## 2016-02-14 ENCOUNTER — Encounter (HOSPITAL_COMMUNITY)
Admission: RE | Disposition: A | Discharge status: Home / Self Care | Payer: Self-pay | Source: Ambulatory Visit | Attending: General Surgery

## 2016-02-14 ENCOUNTER — Other Ambulatory Visit: Payer: Self-pay

## 2016-02-14 ENCOUNTER — Ambulatory Visit (HOSPITAL_COMMUNITY)
Admission: RE | Admit: 2016-02-14 | Discharge: 2016-02-15 | Disposition: A | Discharge status: Home / Self Care | Payer: 59 | Source: Ambulatory Visit | Attending: General Surgery | Admitting: General Surgery

## 2016-02-14 ENCOUNTER — Encounter (HOSPITAL_COMMUNITY): Payer: Self-pay | Admitting: *Deleted

## 2016-02-14 DIAGNOSIS — I739 Peripheral vascular disease, unspecified: Secondary | ICD-10-CM | POA: Insufficient documentation

## 2016-02-14 DIAGNOSIS — Z79899 Other long term (current) drug therapy: Secondary | ICD-10-CM | POA: Diagnosis not present

## 2016-02-14 DIAGNOSIS — Z7982 Long term (current) use of aspirin: Secondary | ICD-10-CM | POA: Diagnosis not present

## 2016-02-14 DIAGNOSIS — C50912 Malignant neoplasm of unspecified site of left female breast: Secondary | ICD-10-CM | POA: Diagnosis not present

## 2016-02-14 DIAGNOSIS — K219 Gastro-esophageal reflux disease without esophagitis: Secondary | ICD-10-CM | POA: Insufficient documentation

## 2016-02-14 DIAGNOSIS — C779 Secondary and unspecified malignant neoplasm of lymph node, unspecified: Secondary | ICD-10-CM

## 2016-02-14 DIAGNOSIS — Z170 Estrogen receptor positive status [ER+]: Secondary | ICD-10-CM | POA: Insufficient documentation

## 2016-02-14 DIAGNOSIS — Z87891 Personal history of nicotine dependence: Secondary | ICD-10-CM | POA: Insufficient documentation

## 2016-02-14 DIAGNOSIS — I10 Essential (primary) hypertension: Secondary | ICD-10-CM | POA: Insufficient documentation

## 2016-02-14 DIAGNOSIS — C773 Secondary and unspecified malignant neoplasm of axilla and upper limb lymph nodes: Secondary | ICD-10-CM | POA: Insufficient documentation

## 2016-02-14 HISTORY — DX: Polyneuropathy, unspecified: G62.9

## 2016-02-14 HISTORY — DX: Personal history of other medical treatment: Z92.89

## 2016-02-14 HISTORY — DX: Malignant neoplasm of unspecified site of unspecified female breast: C50.919

## 2016-02-14 HISTORY — DX: Pneumonia, unspecified organism: J18.9

## 2016-02-14 HISTORY — PX: AXILLARY LYMPH NODE DISSECTION: SHX5229

## 2016-02-14 HISTORY — DX: Unspecified asthma, uncomplicated: J45.909

## 2016-02-14 LAB — CBC
HEMATOCRIT: 29.5 % — AB (ref 36.0–46.0)
HEMOGLOBIN: 9.4 g/dL — AB (ref 12.0–15.0)
MCH: 30 pg (ref 26.0–34.0)
MCHC: 31.9 g/dL (ref 30.0–36.0)
MCV: 94.2 fL (ref 78.0–100.0)
Platelets: 512 10*3/uL — ABNORMAL HIGH (ref 150–400)
RBC: 3.13 MIL/uL — ABNORMAL LOW (ref 3.87–5.11)
RDW: 13.9 % (ref 11.5–15.5)
WBC: 4.7 10*3/uL (ref 4.0–10.5)

## 2016-02-14 LAB — BASIC METABOLIC PANEL
ANION GAP: 7 (ref 5–15)
BUN: 10 mg/dL (ref 6–20)
CO2: 22 mmol/L (ref 22–32)
Calcium: 10.1 mg/dL (ref 8.9–10.3)
Chloride: 110 mmol/L (ref 101–111)
Creatinine, Ser: 0.73 mg/dL (ref 0.44–1.00)
GFR calc Af Amer: >60 mL/min (ref >60)
GFR calc non Af Amer: >60 mL/min (ref >60)
GLUCOSE: 93 mg/dL (ref 65–99)
POTASSIUM: 4 mmol/L (ref 3.5–5.1)
Sodium: 139 mmol/L (ref 135–145)

## 2016-02-14 SURGERY — LYMPHADENECTOMY, AXILLARY
Anesthesia: General | Site: Axilla | Laterality: Left

## 2016-02-14 MED ORDER — FLUTICASONE PROPIONATE 50 MCG/ACT NA SUSP
2.0000 | Freq: Every day | NASAL | Status: DC
  Administered 2016-02-15: 2 via NASAL
  Filled 2016-02-14: qty 16

## 2016-02-14 MED ORDER — LIDOCAINE HCL (CARDIAC) 20 MG/ML IV SOLN
INTRAVENOUS | Status: DC | PRN
  Administered 2016-02-14: 30 mg via INTRAVENOUS

## 2016-02-14 MED ORDER — MORPHINE SULFATE (PF) 2 MG/ML IV SOLN
INTRAVENOUS | Status: AC
  Administered 2016-02-14: 2 mg via INTRAVENOUS
  Filled 2016-02-14: qty 1

## 2016-02-14 MED ORDER — NEOSTIGMINE METHYLSULFATE 5 MG/5ML IV SOSY
PREFILLED_SYRINGE | INTRAVENOUS | Status: AC
  Filled 2016-02-14: qty 5

## 2016-02-14 MED ORDER — LACTATED RINGERS IV SOLN: INTRAVENOUS | Status: DC

## 2016-02-14 MED ORDER — HYDROCODONE-ACETAMINOPHEN 5-325 MG PO TABS
1.0000 | ORAL_TABLET | ORAL | Status: DC | PRN
  Administered 2016-02-14 – 2016-02-15 (×5): 2 via ORAL
  Filled 2016-02-14 (×5): qty 2

## 2016-02-14 MED ORDER — ACETAMINOPHEN 325 MG PO TABS: 650.0000 mg | ORAL_TABLET | Freq: Four times a day (QID) | ORAL | Status: DC | PRN

## 2016-02-14 MED ORDER — ONDANSETRON HCL 4 MG/2ML IJ SOLN
INTRAMUSCULAR | Status: AC
  Filled 2016-02-14: qty 2

## 2016-02-14 MED ORDER — CHLORHEXIDINE GLUCONATE CLOTH 2 % EX PADS: 6.0000 | MEDICATED_PAD | Freq: Once | CUTANEOUS | Status: DC

## 2016-02-14 MED ORDER — ROCURONIUM BROMIDE 50 MG/5ML IV SOLN
INTRAVENOUS | Status: AC
  Filled 2016-02-14: qty 1

## 2016-02-14 MED ORDER — HYDROCODONE-ACETAMINOPHEN 5-325 MG PO TABS
ORAL_TABLET | ORAL | Status: AC
Start: 2016-02-14 — End: 2016-02-14
  Administered 2016-02-14: 2
  Filled 2016-02-14: qty 1

## 2016-02-14 MED ORDER — DIPHENHYDRAMINE HCL 12.5 MG/5ML PO ELIX
12.5000 mg | ORAL_SOLUTION | Freq: Four times a day (QID) | ORAL | Status: DC | PRN
Start: 2016-02-14 — End: 2016-02-15

## 2016-02-14 MED ORDER — BUPIVACAINE-EPINEPHRINE (PF) 0.25% -1:200000 IJ SOLN
INTRAMUSCULAR | Status: AC
Start: 2016-02-14 — End: 2016-02-14
  Filled 2016-02-14: qty 30

## 2016-02-14 MED ORDER — ACETAMINOPHEN 650 MG RE SUPP: 650.0000 mg | Freq: Four times a day (QID) | RECTAL | Status: DC | PRN

## 2016-02-14 MED ORDER — ACETAMINOPHEN 500 MG PO TABS
1000.0000 mg | ORAL_TABLET | ORAL | Status: AC
  Administered 2016-02-14: 1000 mg via ORAL
  Filled 2016-02-14: qty 2

## 2016-02-14 MED ORDER — PROPOFOL 10 MG/ML IV BOLUS
INTRAVENOUS | Status: DC | PRN
  Administered 2016-02-14: 150 mg via INTRAVENOUS

## 2016-02-14 MED ORDER — POLYETHYLENE GLYCOL 3350 17 G PO PACK
17.0000 g | PACK | Freq: Every day | ORAL | Status: DC | PRN
  Administered 2016-02-15: 17 g via ORAL
  Filled 2016-02-14: qty 1

## 2016-02-14 MED ORDER — 0.9 % SODIUM CHLORIDE (POUR BTL) OPTIME
TOPICAL | Status: DC | PRN
  Administered 2016-02-14: 1000 mL

## 2016-02-14 MED ORDER — GLYCOPYRROLATE 0.2 MG/ML IV SOSY
PREFILLED_SYRINGE | INTRAVENOUS | Status: AC
  Filled 2016-02-14: qty 6

## 2016-02-14 MED ORDER — KCL IN DEXTROSE-NACL 20-5-0.45 MEQ/L-%-% IV SOLN
INTRAVENOUS | Status: DC
  Administered 2016-02-14 – 2016-02-15 (×3): via INTRAVENOUS
  Filled 2016-02-14 (×2): qty 1000

## 2016-02-14 MED ORDER — EPHEDRINE SULFATE 50 MG/ML IJ SOLN
INTRAMUSCULAR | Status: DC | PRN
Start: 2016-02-14 — End: 2016-02-14
  Administered 2016-02-14 (×2): 10 mg via INTRAVENOUS

## 2016-02-14 MED ORDER — LORATADINE 10 MG PO TABS
10.0000 mg | ORAL_TABLET | Freq: Every day | ORAL | Status: DC
  Administered 2016-02-14 – 2016-02-15 (×2): 10 mg via ORAL
  Filled 2016-02-14 (×2): qty 1

## 2016-02-14 MED ORDER — RISAQUAD PO CAPS
ORAL_CAPSULE | Freq: Every day | ORAL | Status: DC
  Administered 2016-02-14 – 2016-02-15 (×2): 1 via ORAL
  Filled 2016-02-14 (×2): qty 1

## 2016-02-14 MED ORDER — PANTOPRAZOLE SODIUM 40 MG PO TBEC
40.0000 mg | DELAYED_RELEASE_TABLET | Freq: Every day | ORAL | Status: DC
  Administered 2016-02-15: 40 mg via ORAL
  Filled 2016-02-14 (×2): qty 1

## 2016-02-14 MED ORDER — HYDROMORPHONE HCL 1 MG/ML IJ SOLN
0.2500 mg | INTRAMUSCULAR | Status: DC | PRN
  Administered 2016-02-14 (×2): 0.5 mg via INTRAVENOUS

## 2016-02-14 MED ORDER — HYDROCODONE-ACETAMINOPHEN 5-325 MG PO TABS
ORAL_TABLET | ORAL | Status: AC
  Filled 2016-02-14: qty 1

## 2016-02-14 MED ORDER — VALACYCLOVIR HCL 500 MG PO TABS
500.0000 mg | ORAL_TABLET | Freq: Two times a day (BID) | ORAL | Status: DC
  Administered 2016-02-14 – 2016-02-15 (×3): 500 mg via ORAL
  Filled 2016-02-14 (×3): qty 1

## 2016-02-14 MED ORDER — TRAMADOL HCL 50 MG PO TABS: 50.0000 mg | ORAL_TABLET | Freq: Four times a day (QID) | ORAL | Status: DC | PRN

## 2016-02-14 MED ORDER — SIMETHICONE 80 MG PO CHEW: 40.0000 mg | CHEWABLE_TABLET | Freq: Four times a day (QID) | ORAL | Status: DC | PRN

## 2016-02-14 MED ORDER — NIFEDIPINE ER OSMOTIC RELEASE 30 MG PO TB24
30.0000 mg | ORAL_TABLET | Freq: Every day | ORAL | Status: DC
  Administered 2016-02-14 – 2016-02-15 (×2): 30 mg via ORAL
  Filled 2016-02-14 (×2): qty 1

## 2016-02-14 MED ORDER — ZOLPIDEM TARTRATE 5 MG PO TABS: 5.0000 mg | ORAL_TABLET | Freq: Every evening | ORAL | Status: DC | PRN

## 2016-02-14 MED ORDER — LORAZEPAM 0.5 MG PO TABS
0.5000 mg | ORAL_TABLET | Freq: Every day | ORAL | Status: DC
  Administered 2016-02-14: 0.5 mg via ORAL
  Filled 2016-02-14: qty 1

## 2016-02-14 MED ORDER — MIDAZOLAM HCL 2 MG/2ML IJ SOLN
INTRAMUSCULAR | Status: AC
Start: 2016-02-14 — End: 2016-02-14
  Filled 2016-02-14: qty 2

## 2016-02-14 MED ORDER — IBUPROFEN 400 MG PO TABS: 400.0000 mg | ORAL_TABLET | Freq: Four times a day (QID) | ORAL | Status: DC | PRN

## 2016-02-14 MED ORDER — MEPERIDINE HCL 25 MG/ML IJ SOLN: 6.2500 mg | INTRAMUSCULAR | Status: DC | PRN

## 2016-02-14 MED ORDER — FENTANYL CITRATE (PF) 250 MCG/5ML IJ SOLN
INTRAMUSCULAR | Status: AC
  Filled 2016-02-14: qty 5

## 2016-02-14 MED ORDER — IRBESARTAN 150 MG PO TABS
150.0000 mg | ORAL_TABLET | Freq: Every day | ORAL | Status: DC
  Administered 2016-02-15: 150 mg via ORAL
  Filled 2016-02-14 (×2): qty 1

## 2016-02-14 MED ORDER — MORPHINE SULFATE (PF) 2 MG/ML IV SOLN
1.0000 mg | INTRAVENOUS | Status: DC | PRN
  Administered 2016-02-14 – 2016-02-15 (×9): 2 mg via INTRAVENOUS
  Filled 2016-02-14 (×8): qty 1

## 2016-02-14 MED ORDER — LACTATED RINGERS IV SOLN
INTRAVENOUS | Status: DC
  Administered 2016-02-14: 09:00:00 via INTRAVENOUS

## 2016-02-14 MED ORDER — METHOCARBAMOL 500 MG PO TABS
500.0000 mg | ORAL_TABLET | Freq: Four times a day (QID) | ORAL | Status: DC
  Administered 2016-02-14 – 2016-02-15 (×4): 500 mg via ORAL
  Filled 2016-02-14 (×4): qty 1

## 2016-02-14 MED ORDER — DIPHENHYDRAMINE HCL 50 MG/ML IJ SOLN: 12.5000 mg | Freq: Four times a day (QID) | INTRAMUSCULAR | Status: DC | PRN

## 2016-02-14 MED ORDER — LIDOCAINE 2% (20 MG/ML) 5 ML SYRINGE
INTRAMUSCULAR | Status: AC
  Filled 2016-02-14: qty 5

## 2016-02-14 MED ORDER — LIDOCAINE HCL 1 % IJ SOLN
INTRAMUSCULAR | Status: DC | PRN
  Administered 2016-02-14: 10 mL via INTRAMUSCULAR

## 2016-02-14 MED ORDER — HYDRALAZINE HCL 20 MG/ML IJ SOLN: 10.0000 mg | INTRAMUSCULAR | Status: DC | PRN

## 2016-02-14 MED ORDER — HYDROMORPHONE HCL 1 MG/ML IJ SOLN
INTRAMUSCULAR | Status: AC
  Filled 2016-02-14: qty 1

## 2016-02-14 MED ORDER — MIDAZOLAM HCL 5 MG/5ML IJ SOLN
INTRAMUSCULAR | Status: DC | PRN
  Administered 2016-02-14: 2 mg via INTRAVENOUS

## 2016-02-14 MED ORDER — GABAPENTIN 100 MG PO CAPS
100.0000 mg | ORAL_CAPSULE | Freq: Three times a day (TID) | ORAL | Status: DC
  Administered 2016-02-14 – 2016-02-15 (×3): 100 mg via ORAL
  Filled 2016-02-14 (×3): qty 1

## 2016-02-14 MED ORDER — LIDOCAINE 2% (20 MG/ML) 5 ML SYRINGE
INTRAMUSCULAR | Status: AC
  Filled 2016-02-14: qty 10

## 2016-02-14 MED ORDER — PROCHLORPERAZINE MALEATE 10 MG PO TABS
10.0000 mg | ORAL_TABLET | Freq: Four times a day (QID) | ORAL | Status: DC | PRN
  Filled 2016-02-14: qty 1

## 2016-02-14 MED ORDER — PROMETHAZINE HCL 25 MG/ML IJ SOLN: 6.2500 mg | INTRAMUSCULAR | Status: DC | PRN

## 2016-02-14 MED ORDER — DEXAMETHASONE SODIUM PHOSPHATE 10 MG/ML IJ SOLN
INTRAMUSCULAR | Status: AC
  Filled 2016-02-14: qty 1

## 2016-02-14 MED ORDER — CEFAZOLIN SODIUM-DEXTROSE 2-4 GM/100ML-% IV SOLN
2.0000 g | Freq: Three times a day (TID) | INTRAVENOUS | Status: AC
  Administered 2016-02-14: 2 g via INTRAVENOUS
  Filled 2016-02-14: qty 100

## 2016-02-14 MED ORDER — LIDOCAINE HCL (PF) 1 % IJ SOLN
INTRAMUSCULAR | Status: AC
Start: 2016-02-14 — End: 2016-02-14
  Filled 2016-02-14: qty 30

## 2016-02-14 MED ORDER — DEXAMETHASONE SODIUM PHOSPHATE 10 MG/ML IJ SOLN
INTRAMUSCULAR | Status: DC | PRN
  Administered 2016-02-14: 10 mg via INTRAVENOUS

## 2016-02-14 MED ORDER — GABAPENTIN 300 MG PO CAPS
300.0000 mg | ORAL_CAPSULE | ORAL | Status: AC
  Administered 2016-02-14: 300 mg via ORAL
  Filled 2016-02-14: qty 1

## 2016-02-14 MED ORDER — FENTANYL CITRATE (PF) 100 MCG/2ML IJ SOLN
INTRAMUSCULAR | Status: DC | PRN
  Administered 2016-02-14 (×2): 50 ug via INTRAVENOUS

## 2016-02-14 MED ORDER — PHENYLEPHRINE HCL 10 MG/ML IJ SOLN
INTRAMUSCULAR | Status: DC | PRN
  Administered 2016-02-14 (×6): 80 ug via INTRAVENOUS

## 2016-02-14 MED ORDER — TRAZODONE HCL 150 MG PO TABS
150.0000 mg | ORAL_TABLET | Freq: Every day | ORAL | Status: DC
  Administered 2016-02-14: 150 mg via ORAL
  Filled 2016-02-14: qty 1

## 2016-02-14 MED ORDER — CELECOXIB 400 MG PO CAPS
400.0000 mg | ORAL_CAPSULE | ORAL | Status: AC
  Administered 2016-02-14: 400 mg via ORAL
  Filled 2016-02-14: qty 1

## 2016-02-14 MED ORDER — LACTATED RINGERS IV SOLN
INTRAVENOUS | Status: DC | PRN
Start: 2016-02-14 — End: 2016-02-14
  Administered 2016-02-14 (×2): via INTRAVENOUS

## 2016-02-14 SURGICAL SUPPLY — 61 items
ADH SKN CLS APL DERMABOND .7 (GAUZE/BANDAGES/DRESSINGS) ×1
BINDER BREAST XLRG (GAUZE/BANDAGES/DRESSINGS) ×2 IMPLANT
BNDG COHESIVE 6X5 TAN STRL LF (GAUZE/BANDAGES/DRESSINGS) ×3 IMPLANT
CANISTER SUCTION 2500CC (MISCELLANEOUS) IMPLANT
CHLORAPREP W/TINT 26ML (MISCELLANEOUS) ×5 IMPLANT
CLIP TI MEDIUM 24 (CLIP) ×2 IMPLANT
CLIP TI MEDIUM 6 (CLIP) ×2 IMPLANT
CLIP TI WIDE RED SMALL 6 (CLIP) ×2 IMPLANT
CLOSURE WOUND 1/2 X4 (GAUZE/BANDAGES/DRESSINGS) ×1
CONT SPEC 4OZ CLIKSEAL STRL BL (MISCELLANEOUS) ×4 IMPLANT
COVER SURGICAL LIGHT HANDLE (MISCELLANEOUS) ×3 IMPLANT
DERMABOND ADVANCED (GAUZE/BANDAGES/DRESSINGS) ×2
DERMABOND ADVANCED .7 DNX12 (GAUZE/BANDAGES/DRESSINGS) IMPLANT
DRAIN CHANNEL 19F RND (DRAIN) ×3 IMPLANT
DRAPE UTILITY XL STRL (DRAPES) ×6 IMPLANT
ELECT CAUTERY BLADE 6.4 (BLADE) ×3 IMPLANT
ELECT REM PT RETURN 9FT ADLT (ELECTROSURGICAL) ×3
ELECTRODE REM PT RTRN 9FT ADLT (ELECTROSURGICAL) ×1 IMPLANT
EVACUATOR SILICONE 100CC (DRAIN) ×2 IMPLANT
GAUZE SPONGE 4X4 12PLY STRL (GAUZE/BANDAGES/DRESSINGS) ×3 IMPLANT
GAUZE SPONGE 4X4 16PLY XRAY LF (GAUZE/BANDAGES/DRESSINGS) ×3 IMPLANT
GLOVE BIO SURGEON STRL SZ 6 (GLOVE) ×3 IMPLANT
GLOVE BIO SURGEON STRL SZ 6.5 (GLOVE) ×1 IMPLANT
GLOVE BIO SURGEON STRL SZ7 (GLOVE) ×2 IMPLANT
GLOVE BIO SURGEONS STRL SZ 6.5 (GLOVE) ×1
GLOVE BIOGEL PI IND STRL 6.5 (GLOVE) ×1 IMPLANT
GLOVE BIOGEL PI IND STRL 7.0 (GLOVE) IMPLANT
GLOVE BIOGEL PI INDICATOR 6.5 (GLOVE) ×2
GLOVE BIOGEL PI INDICATOR 7.0 (GLOVE) ×2
GOWN STRL REUS W/ TWL LRG LVL3 (GOWN DISPOSABLE) ×1 IMPLANT
GOWN STRL REUS W/TWL 2XL LVL3 (GOWN DISPOSABLE) ×6 IMPLANT
GOWN STRL REUS W/TWL LRG LVL3 (GOWN DISPOSABLE) ×3
KIT BASIN OR (CUSTOM PROCEDURE TRAY) ×3 IMPLANT
KIT ROOM TURNOVER OR (KITS) ×3 IMPLANT
LIGHT WAVEGUIDE WIDE FLAT (MISCELLANEOUS) ×2 IMPLANT
LIQUID BAND (GAUZE/BANDAGES/DRESSINGS) ×1 IMPLANT
NEEDLE HYPO 25GX1X1/2 BEV (NEEDLE) IMPLANT
NS IRRIG 1000ML POUR BTL (IV SOLUTION) ×3 IMPLANT
PACK SURGICAL SETUP 50X90 (CUSTOM PROCEDURE TRAY) ×3 IMPLANT
PACK UNIVERSAL I (CUSTOM PROCEDURE TRAY) ×3 IMPLANT
PAD ABD 8X10 STRL (GAUZE/BANDAGES/DRESSINGS) ×2 IMPLANT
PAD ARMBOARD 7.5X6 YLW CONV (MISCELLANEOUS) ×6 IMPLANT
PENCIL BUTTON HOLSTER BLD 10FT (ELECTRODE) ×3 IMPLANT
SOL PREP POV-IOD 4OZ 10% (MISCELLANEOUS) ×2 IMPLANT
SPONGE DRAIN TRACH 4X4 STRL 2S (GAUZE/BANDAGES/DRESSINGS) ×2 IMPLANT
SPONGE LAP 18X18 X RAY DECT (DISPOSABLE) ×2 IMPLANT
STOCKINETTE IMPERVIOUS 9X36 MD (GAUZE/BANDAGES/DRESSINGS) ×3 IMPLANT
STRIP CLOSURE SKIN 1/2X4 (GAUZE/BANDAGES/DRESSINGS) ×2 IMPLANT
SUT ETHILON 2 0 FS 18 (SUTURE) ×2 IMPLANT
SUT MON AB 4-0 PC3 18 (SUTURE) ×3 IMPLANT
SUT SILK 2 0 FS (SUTURE) ×3 IMPLANT
SUT VIC AB 3-0 SH 27 (SUTURE) ×3
SUT VIC AB 3-0 SH 27X BRD (SUTURE) ×1 IMPLANT
SYR BULB IRRIGATION 50ML (SYRINGE) ×2 IMPLANT
SYR CONTROL 10ML LL (SYRINGE) ×2 IMPLANT
TOWEL OR 17X24 6PK STRL BLUE (TOWEL DISPOSABLE) ×3 IMPLANT
TOWEL OR 17X26 10 PK STRL BLUE (TOWEL DISPOSABLE) ×3 IMPLANT
TUBE CONNECTING 12'X1/4 (SUCTIONS)
TUBE CONNECTING 12X1/4 (SUCTIONS) IMPLANT
WATER STERILE IRR 1000ML POUR (IV SOLUTION) IMPLANT
YANKAUER SUCT BULB TIP NO VENT (SUCTIONS) IMPLANT

## 2016-02-14 NOTE — Anesthesia Postprocedure Evaluation (Signed)
Anesthesia Post Note  Patient: Alison [name]  Procedure(s) Performed: Procedure(s) (LRB): LEFT AXILLARY LYMPH NODE DISSECTION (Left)  Patient location during evaluation: PACU Anesthesia Type: General Level of consciousness: awake and alert Pain management: pain level controlled Vital Signs Assessment: post-procedure vital signs reviewed and stable Respiratory status: spontaneous breathing, nonlabored ventilation, respiratory function stable and patient connected to nasal cannula oxygen Cardiovascular status: blood pressure returned to baseline and stable Postop Assessment: no signs of nausea or vomiting Anesthetic complications: no    Last Vitals:  Filed Vitals:   02/14/16 1245 02/14/16 1318  BP: 120/64 120/64  Pulse: 109 109  Temp: 36.8 C 36.8 C  Resp: 18 18    Last Pain:  Filed Vitals:   02/14/16 1322  PainSc: [place] Shreeya Recendiz

## 2016-02-14 NOTE — Transfer of Care (Signed)
Immediate Anesthesia Transfer of Care Note  Patient: Alison [name]  Procedure(s) Performed: Procedure(s): LEFT AXILLARY LYMPH NODE DISSECTION (Left)  Patient Location: PACU  Anesthesia Type:General  Level of Consciousness: awake, alert  and oriented  Airway & Oxygen Therapy: Patient Spontanous Breathing and Patient connected to nasal cannula oxygen  Post-op Assessment: Report given to RN, Post -op Vital signs reviewed and stable and Patient moving all extremities X 4  Post vital signs: Reviewed and stable  Last Vitals:  Filed Vitals:   02/14/16 0805  BP: 109/67  Pulse: 72  Temp: 37.1 C  Resp: 20    Last Pain:  Filed Vitals:   02/14/16 0833  PainSc: 5       Patients Stated Pain Goal: 3 ([phone] [id number])  Complications: No apparent anesthesia complications

## 2016-02-14 NOTE — Anesthesia Procedure Notes (Signed)
Procedure Name: LMA Insertion Date/Time: 02/14/2016 9:26 AM Performed by: Neldon Newport Pre-anesthesia Checklist: Patient being monitored, Suction available, Emergency Drugs available, Patient identified and Timeout performed Patient Re-evaluated:Patient Re-evaluated prior to inductionOxygen Delivery Method: Circle system utilized Preoxygenation: Pre-oxygenation with 100% oxygen Intubation Type: IV induction Ventilation: Mask ventilation without difficulty LMA: LMA inserted LMA Size: 4.0 Tube type: Oral Number of attempts: 1 Placement Confirmation: positive ETCO2,  ETT inserted through vocal cords under direct vision and breath sounds checked- equal and bilateral Tube secured with: Tape Dental Injury: Teeth and Oropharynx as per pre-operative assessment

## 2016-02-14 NOTE — Discharge Instructions (Signed)
Bulb Drain Home Care A bulb drain consists of a thin rubber tube and a soft, round bulb that creates a gentle suction. The rubber tube is placed in the area where you had surgery. A bulb is attached to the end of the tube that is outside the body. The bulb drain removes excess fluid that normally builds up in a surgical wound after surgery. The color and amount of fluid will vary. Immediately after surgery, the fluid is bright red and is a little thicker than water. It may gradually change to a yellow or pink color and become more thin and water-like. When the amount decreases to about 1 or 2 tbsp in 24 hours, your health care provider will usually remove it. DAILY CARE  Keep the bulb flat (compressed) at all times, except while emptying it. The flatness creates suction. You can flatten the bulb by squeezing it firmly in the middle and then closing the cap.  Keep sites where the tube enters the skin dry and covered with a bandage (dressing).  Secure the tube 1-2 in (2.5-5.1 cm) below the insertion sites to keep it from pulling on your stitches. The tube is stitched in place and will not slip out.  Secure the bulb as directed by your health care provider.  For the first 3 days after surgery, there usually is more fluid in the bulb. Empty the bulb whenever it becomes half full because the bulb does not create enough suction if it is too full. The bulb could also overflow. Write down how much fluid you remove each time you empty your drain. Add up the amount removed in 24 hours.  Empty the bulb at the same time every day once the amount of fluid decreases and you only need to empty it once a day. Write down the amounts and the 24-hour totals to give to your health care provider. This helps your health care provider know when the tubes can be removed. EMPTYING THE BULB DRAIN Before emptying the bulb, get a measuring cup, a piece of paper and a pen, and wash your hands.  Gently run your fingers down the  tube (stripping) to empty any drainage from the tubing into the bulb. This may need to be done several times a day to clear the tubing of clots and tissue.  Open the bulb cap to release suction, which causes it to inflate. Do not touch the inside of the cap.  Gently run your fingers down the tube (stripping) to empty any drainage from the tubing into the bulb.  Hold the cap out of the way, and pour fluid into the measuring cup.   Squeeze the bulb to provide suction.  Replace the cap.   Check the tape that holds the tube to your skin. If it is becoming loose, you can remove the loose piece of tape and apply a new one. Then, pin the bulb to your shirt.   Write down the amount of fluid you emptied out. Write down the date and each time you emptied your bulb drain. (If there are 2 bulbs, note the amount of drainage from each bulb and keep the totals separate. Your health care provider will want to know the total amounts for each drain and which tube is draining more.)   Flush the fluid down the toilet and wash your hands.   Call your health care provider once you have less than 2 tbsp of fluid collecting in the bulb drain every 24 hours. If  there is drainage around the tube site, change dressings and keep the area dry. Cleanse around tube with sterile saline and place dry gauze around site. This gauze should be changed when it is soiled. If it stays clean and unsoiled, it should still be changed daily.  SEEK MEDICAL CARE IF:  Your drainage has a bad smell or is cloudy.   You have a fever.   Your drainage is increasing instead of decreasing.   Your tube fell out.   You have redness or swelling around the tube site.   You have drainage from a surgical wound.   Your bulb drain will not stay flat after you empty it.  MAKE SURE YOU:   Understand these instructions.  Will watch your condition.  Will get help right away if you are not doing well or get worse.   This  information is not intended to replace advice given to you by your health care provider. Make sure you discuss any questions you have with your health care provider.   Document Released: 08/10/2000 Document Revised: 09/03/2014 Document Reviewed: 03/02/2015 Elsevier Interactive Patient Education [employer].

## 2016-02-14 NOTE — Anesthesia Preprocedure Evaluation (Addendum)
Anesthesia Evaluation  Patient identified by MRN, date of birth, ID band Patient awake    Reviewed: Allergy & Precautions, NPO status , Patient's Chart, lab work & pertinent test results  Airway Mallampati: I  TM Distance: >3 FB Neck ROM: Full    Dental  (+) Teeth Intact, Dental Advisory Given   Pulmonary asthma , former smoker,    breath sounds clear to auscultation       Cardiovascular hypertension, Pt. on medications + Peripheral Vascular Disease   Rhythm:Regular Rate:Normal     Neuro/Psych  Neuromuscular disease negative psych ROS   GI/Hepatic Neg liver ROS, GERD  Medicated,  Endo/Other  negative endocrine ROS  Renal/GU negative Renal ROS  negative genitourinary   Musculoskeletal negative musculoskeletal ROS (+)   Abdominal   Peds negative pediatric ROS (+)  Hematology negative hematology ROS (+)   Anesthesia Other Findings   Reproductive/Obstetrics negative OB ROS                           Lab Results  Component Value Date   WBC 5.4 01/06/2016   HGB 10.6* 01/06/2016   HCT 31.7* 01/06/2016   MCV 97.5 01/06/2016   PLT 371 01/06/2016   Lab Results  Component Value Date   CREATININE 0.8 01/06/2016   BUN 9.1 01/06/2016   NA 139 01/06/2016   K 4.1 01/06/2016   CL 108 11/12/2015   CO2 24 01/06/2016   Lab Results  Component Value Date   INR 1.03 08/15/2015   07/2015 - Left ventricle: The cavity size was normal. Wall thickness was increased in a pattern of mild LVH. Systolic function was normal. The estimated ejection fraction was in the range of 55% to 60%. Wall motion was normal; there were no regional wall motion abnormalities. Doppler parameters are consistent with abnormal left ventricular relaxation (grade 1 diastolic dysfunction). - Aortic valve: There was trivial regurgitation. - Left atrium: The atrium was mildly dilated. - Atrial septum: There was increased  thickness of the septum, consistent with lipomatous hypertrophy. - Pericardium, extracardiac: A trivial pericardial effusion was identified.     Anesthesia Physical Anesthesia Plan  ASA: II  Anesthesia Plan: General   Post-op Pain Management:    Induction: Intravenous  Airway Management Planned: LMA  Additional Equipment:   Intra-op Plan:   Post-operative Plan: Extubation in OR  Informed Consent: I have reviewed the patients History and Physical, chart, labs and discussed the procedure including the risks, benefits and alternatives for the proposed anesthesia with the patient or authorized representative who has indicated his/her understanding and acceptance.   Dental advisory given and Dental Advisory Given  Plan Discussed with: CRNA, Anesthesiologist and Surgeon  Anesthesia Plan Comments: (Local at surgical site. )     Anesthesia Quick Evaluation

## 2016-02-14 NOTE — H&P (View-Only) (Signed)
Alison [name] 01/12/2016 10:11 AM Location: [facility] Patient #: [id number] DOB: [birth year] Married / Language: English / Race: White Female   History of Present Illness Alison [name]; 01/13/2016 1:04 PM) The patient is a 55 year old female who presents with breast cancer. PRIOR HISTORY [Pt is a 55 yo F who presented in November 2016 with a change in her upper left breast felt at the end of a shower. She had some swelling and went for dx imaging. She was found to have two areas in the left upper outer quadrant 1.8 cm apart and trabecular thickening. The two masses were 2.3 cm at 2 o'clock and 1 cm at 1 o'clock. The axillary lymph node was 6 mm. All were biopsies and showed invasive ductal carcinoma, g 3 wtih associated DCIS, ER/PR +, Her 2 negative. Proliferation was 12%. She has been undergoing chemotherapy. She did well with AC, but is very fatigued with the taxol. She had originally seen Dr. Brantley [name], but wanted to switch based on discussions at chemotherapy. She was originally thinking she would need a mastectomy, but now is leaning toward lumpectomy if possible. ]  REPEAT MRI RECOMMENDATION: 1. The MRI findings were discussed by telephone with Dr. Barry [name] today. We discussed the presence of suspicious enhancement involving all 4 quadrants of the breast, suspicious for multicentric disease. At this point, malignancy is only biopsy proven in the upper outer quadrant.  2. Schedule TWO MRI-guided biopsies of the LEFT breast. In our discussion today, Dr. Barry [name] would like the patient to have two additional MRI guided biopsies to prove the extent of disease. We discussed that biopsies could be performed of suspicious enhancement in the lower central/slightly outer left breast as seen on image 88 of 248 on the post contrast images, and of suspicious enhancement in the upper inner quadrant of the right breast, such as that seen on image number 140 of 258.   The  patient has thought about this and discussed with her husband. She would like to just proceed with mastectomy. She has also reviewed the alliance trial. She does not want to participate in that. She had a bx proven + LN pre chemo, but this is negative on MR at this time. She would like to do "just some of the nodes if possible." She has had a hard time with chemo, being fatigued, and having to delay a bit for a few tx. She has also had to abort a few tx.      Allergies Alison [name]; 01/12/2016 10:11 AM) Sulfa Antibiotics blisters  Medication History Alison [name]; 01/12/2016 10:12 AM) Benicar (20MG  Tablet, Oral) Active. Tricor (145MG  Tablet, Oral) Active. Vivelle (0.1MG /24HR Patch TW, Transdermal) Active. NIFEdipine ER (30MG  Tablet ER 24HR, Oral) Active. Dextroamphetamine Sulfate (15MG  Tablet, Oral) Active. Vitamin D3 (2000UNIT Capsule, 2 Oral daily) Active. Calcium 600 (1500MG  Tablet, Oral) Active. Aspirin EC (81MG  Tablet DR, Oral) Active. Probiotic (Oral) Active. MiraLax (3 capfulls Oral daily) Active. TraMADol HCl (50MG  Tablet, Oral) Active. Medications Reconciled    Review of Systems Alison [name]; 01/13/2016 1:04 PM) All other systems negative  Vitals Alison [name] [name]; 01/12/2016 10:13 AM) 01/12/2016 10:12 AM Weight: 155 lb Height: 63in Body Surface Area: 1.74 m Body Mass Index: 27.46 kg/m  Temp.: 97.48F(Temporal)  BP: 126/72 (Sitting, Left Arm, Standard)       Physical Exam Alison [name]; 01/13/2016 1:05 PM) General Mental Status-Alert. General Appearance-Consistent with stated age. Hydration-Well hydrated. Voice-Normal.  Head and Neck Note: alopecia  as expected   Chest and Lung Exam Chest and lung exam reveals -quiet, even and easy respiratory effort with no use of accessory muscles. Inspection Chest Wall - Normal. Back - normal.  Breast Note: no palpable mass in either breast. symmetric bilaterally.  left axilla with palpable LN, but smaller. no nipple retraction, no skin erythema or dimpling.     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  Pt desires up front reconstruction if possible. I will send her back to see Dr. Marla [name] to discuss. I think she has at least a 50% risk of needing radiation. i will communicate with Dr. Pablo [name] as well to discuss this.  Reviewed risks of surgery including bleeding, infection, possible need for additional surgery or procedures, heart or lung problems, possible recurrent cancer or positive margins, possible post op pain or chest wall lymphedema, blood clot, and others.  She wants to proceed. She is currently scheduled for 6/8, and if she has just mastectomy, we can adjust time, but if she is going to get up front surgery, we will need to change date.  30 min spent in evaluation, examination, counseling, and coordination of care. >50% spent in counseling. Current Plans Pt Education - [facility] Mastectomy HCI You are being scheduled for surgery - Our schedulers will call you.  You should hear from our office's scheduling department within 5 working days about the location, date, and time of surgery. We try to make accommodations for patient's preferences in scheduling surgery, but sometimes the OR schedule or the surgeon's schedule prevents [place] from making those accommodations.  If you have not heard from our office [phone]) in 5 working days, call the office and ask for your surgeon's nurse.  If you have other questions about your diagnosis, plan, or surgery, call the office and ask for your surgeon's nurse.  Pt Education - [facility] Free Text Education/Instructions: discussed with patient and provided information.   Signed by Alison Klein, [name] (01/13/2016 1:08 PM)

## 2016-02-14 NOTE — Interval H&P Note (Signed)
History and Physical Interval Note:  02/14/2016 7:24 AM  Alison [name]  has presented today for surgery, with the diagnosis of LEFT BREAST CANCER  The various methods of treatment have been discussed with the patient and family. After consideration of risks, benefits and other options for treatment, the patient has consented to  Procedure(s): LEFT AXILLARY LYMPH NODE DISSECTION (Left) as a surgical intervention .  The patient's history has been reviewed, patient examined, no change in status, stable for surgery.  I have reviewed the patient's chart and labs.  Questions were answered to the patient's satisfaction.     Zakiah Beckerman

## 2016-02-14 NOTE — Op Note (Signed)
PRE-OPERATIVE DIAGNOSIS: left breast cancer  POST-OPERATIVE DIAGNOSIS:  Same  PROCEDURE:  Procedure(s): Left axillary lymph node dissection  SURGEON:  Surgeon(s): [name], MD  ASSISTANT: Cedric Fishman, RNFA  ANESTHESIA:   local and general  DRAINS: (53 Fr) Blake drain(s) in the left axilla   LOCAL MEDICATIONS USED:  MARCAINE    and XYLOCAINE   SPECIMEN:  Source of Specimen:  left axillary contents  DISPOSITION OF SPECIMEN:  PATHOLOGY  COUNTS:  YES  DICTATION: .Dragon Dictation  PLAN OF CARE: Admit to inpatient   PATIENT DISPOSITION:  PACU - hemodynamically stable.  FINDINGS:  Lots of adhesions from SLN bx  EBL: min  PROCEDURE:  Pt was identified in the holding area and taken to the OR where she was placed supine on the OR table.  General anesthesia was induced.  The left arm and chest was prepped and draped in sterile fashion.  A time out was performed according to the surgical safety checklist.  When all was correct, we continued.   An curvalinear incision was made in the axilla. An axillary dissection was performed with removal of the associated lymph nodes and surrounding adipose tissue. This included levels I and II. This was accomplished by exposing the axillary vein anteriorly and inferiorly to the level of the pectoralis minor and laterally over the latissimus dorsi muscle. Posteriorly, the dissection continued to the subscapularis.  Small venous tributaries, lymphatics, and vessels were clipped and ligated or cauterized and divided. The subscapularis muscle was skeletonized. The long thoracic and thoracodorsal neurovascular bundles were identified and preserved.  The wound was irrigated and closed with a 3-0 Vicryl deep dermal interrupted and a 4-0 vicryl subcuticular closure in layers.  The wound was cleaned, dried, and dressed with dermabond.  The patient was allowed to emerge form anesthesia and was taken to the PACU in stable condition.  Needle, sponge, and  instrument counts were correct x 2.

## 2016-02-15 ENCOUNTER — Encounter (HOSPITAL_COMMUNITY): Payer: Self-pay | Admitting: General Surgery

## 2016-02-15 DIAGNOSIS — C50912 Malignant neoplasm of unspecified site of left female breast: Secondary | ICD-10-CM | POA: Diagnosis not present

## 2016-02-15 LAB — CBC
HEMATOCRIT: 26.3 % — AB (ref 36.0–46.0)
HEMOGLOBIN: 8.5 g/dL — AB (ref 12.0–15.0)
MCH: 29.6 pg (ref 26.0–34.0)
MCHC: 32.3 g/dL (ref 30.0–36.0)
MCV: 91.6 fL (ref 78.0–100.0)
PLATELETS: 554 10*3/uL — AB (ref 150–400)
RBC: 2.87 MIL/uL — AB (ref 3.87–5.11)
RDW: 13.9 % (ref 11.5–15.5)
WBC: 7.4 10*3/uL (ref 4.0–10.5)

## 2016-02-15 LAB — BASIC METABOLIC PANEL
Anion gap: 7 (ref 5–15)
BUN: 10 mg/dL (ref 6–20)
CHLORIDE: 110 mmol/L (ref 101–111)
CO2: 21 mmol/L — AB (ref 22–32)
Calcium: 9.5 mg/dL (ref 8.9–10.3)
Creatinine, Ser: 0.8 mg/dL (ref 0.44–1.00)
GFR calc Af Amer: >60 mL/min (ref >60)
GFR calc non Af Amer: >60 mL/min (ref >60)
GLUCOSE: 118 mg/dL — AB (ref 65–99)
POTASSIUM: 4.3 mmol/L (ref 3.5–5.1)
Sodium: 138 mmol/L (ref 135–145)

## 2016-02-15 MED ORDER — METHOCARBAMOL 500 MG PO TABS: 500.0000 mg | ORAL_TABLET | Freq: Four times a day (QID) | ORAL | Status: DC | PRN

## 2016-02-15 MED ORDER — OXYCODONE HCL 5 MG PO TABS: 5.0000 mg | ORAL_TABLET | Freq: Four times a day (QID) | ORAL | Status: DC | PRN

## 2016-02-15 NOTE — Progress Notes (Signed)
Discharge papers gone over with pt. And her husband. No questions/complaints. JP drain information sheet given to pt.  Educated patient and her husband on how to empty JP drains, record, milk the tubing, etc. They state they understand how to do the JP drains. Prescriptions given to pt. And her husband. IV taken out. Breast binder/dressings intact on breast. And JP sites. Pt. States no concerns/questions. Pt. D/c'd successfully via w/c.

## 2016-02-21 ENCOUNTER — Encounter: Payer: Self-pay | Admitting: Radiation Oncology

## 2016-03-01 ENCOUNTER — Ambulatory Visit: Payer: 59 | Attending: General Surgery | Admitting: Physical Therapy

## 2016-03-01 DIAGNOSIS — C50412 Malignant neoplasm of upper-outer quadrant of left female breast: Secondary | ICD-10-CM | POA: Insufficient documentation

## 2016-03-01 DIAGNOSIS — R222 Localized swelling, mass and lump, trunk: Secondary | ICD-10-CM

## 2016-03-01 DIAGNOSIS — R221 Localized swelling, mass and lump, neck: Secondary | ICD-10-CM | POA: Diagnosis not present

## 2016-03-01 DIAGNOSIS — M25512 Pain in left shoulder: Secondary | ICD-10-CM | POA: Diagnosis present

## 2016-03-01 DIAGNOSIS — M25612 Stiffness of left shoulder, not elsewhere classified: Secondary | ICD-10-CM | POA: Diagnosis present

## 2016-03-01 NOTE — Therapy (Signed)
[place], [place], [address] Phone: [phone]   Fax:  [phone]  Physical Therapy Evaluation  Patient Details  Name: Alison [name] MRN: [id number] Date of Birth: 18-Sep-1960 Referring Provider: Dr.Byerly   Encounter Date: 03/01/2016      PT End of Session - 03/01/16 1801    Visit Number 1   Number of Visits 9   Date for PT Re-Evaluation 04/26/16   PT Start Time 1430   PT Stop Time 1530   PT Time Calculation (min) 60 min   Activity Tolerance Patient limited by pain   Behavior During Therapy [facility] for tasks assessed/performed      Past Medical History  Diagnosis Date  . Hypertension   . Raynaud's disease   . Rectovaginal fistula     repaired 2014  . Osteopenia determined by x-ray   . Complication of anesthesia 2007    aspiration pneumonia post hysterectomy.  Blood pressure would drop low- but no problems 01/2016- surgery  . Asthma      seasonal asthma  . Pneumonia     1980's and 1990's  . Neuropathy ([place])     with chemo  . History of blood transfusion   . Skin cancer 2014    basal- back  . Breast cancer [facility])     Past Surgical History  Procedure Laterality Date  . C-section    . Abdominal hysterectomy  2007    with BSO  . Appendectomy  2007  . Colon resection  4/'2014  . Hernia repair    . Radioactive seed guided axillary sentinel lymph node Left 02/02/2016    Procedure: LEFT BREAST MASTECTOMY AND RADIOACTIVE SEED GUIDED LEFT SENTINEL LYMPH NODE BIOPSY ;  Surgeon: [name], MD;  Location: [place];  Service: General;  Laterality: Left;  . Port-a-cath removal Right 02/02/2016    Procedure: REMOVAL PORT-A-CATH;  Surgeon: [name], MD;  Location: [place];  Service: General;  Laterality: Right;  . Breast reconstruction with placement of tissue expander and flex hd (acellular hydrated dermis) Left 02/02/2016    Procedure: IMMEDIATE LEFT BREAST  RECONSTRUCTION WITH PLACEMENT OF TISSUE EXPANDER AND FLEX HD (ACELLULAR HYDRATED DERMIS);  Surgeon: [name], DO;  Location: [place];  Service: Plastics;  Laterality: Left;  . Breast surgery Left 02/02/16    mastectomy with reconstruction  . Port- a- cath placement  07/2015  . Axillary lymph node dissection Left 02/14/2016  . Axillary lymph node dissection Left 02/14/2016    Procedure: LEFT AXILLARY LYMPH NODE DISSECTION;  Surgeon: [name], MD;  Location: [place];  Service: General;  Laterality: Left;    There were no vitals filed for this visit.       Subjective Assessment - 03/01/16 1437    Subjective pt states that she in an extreme amount of pain despite mutiple doses of medications.  She has not been able to sleep .  She is going to the beach for a 2week trip with husband that has been planned for a year and is very upset about her current situation    Pertinent History Breast cancer diagnosed November2016 She had chemotherapy. but did have some neuropathy so it had to be stopped. On June 8, she had a mastectomy with immedicate expander placement and with first set of lymphnodes removed, On  June 20  she went for complete axillary lymph node dissection. She says she has had always been "full  necked" but has neck and upper body fullness since the cancer was diagnosed.    Patient Stated Goals to get pain reduced so she can go to the beach this weekend    Currently in Pain? Yes   Pain Score 10-Worst pain ever   Pain Location Chest   Pain Orientation Left   Pain Descriptors / Indicators Aching   Pain Type Intractable pain   Pain Radiating Towards left shoulder    Pain Onset 1 to 4 weeks ago   Pain Frequency Constant   Aggravating Factors  moving arm, pressue on upper shoulder    Pain Relieving Factors nothing    Effect of Pain on Daily Activities very limited             [facility] PT Assessment - 03/01/16 0001    Assessment   Medical Diagnosis breast  cancer   Referring Provider Dr.Byerly    Onset Date/Surgical Date 07/03/15   Hand Dominance Right   Precautions   Precautions Other (comment)   Precaution Comments neuropathy from chemotherapy   Restrictions   Weight Bearing Restrictions No   Balance Screen   Has the patient fallen in the past 6 months No   Has the patient had a decrease in activity level because of a fear of falling?  Yes  pt very careful she will not injure left arm    Is the patient reluctant to leave their home because of a fear of falling?  No   Home Ecologist residence   Living Arrangements Spouse/significant other   Prior Function   Level of Independence Independent   Leisure travel, going out to eat    Cognition   Overall Cognitive Status Within Functional Limits for tasks assessed  appropriate tearfulness   Observation/Other Assessments   Observations healing incisions in left anterior chest and axilla from surgery with fullness above incisions in axilla.  severe non pitting edema in across upper chest, anterior and lateral neck across upper back    Skin Integrity healing incisions    Other Surveys  --  lymphedema life impact scale 62 or 91% impaired    Sensation   Additional Comments numbness in left axilla    Coordination   Gross Motor Movements are Fluid and Coordinated No  decreased movement of left arm    Posture/Postural Control   Posture/Postural Control Postural limitations   Postural Limitations Rounded Shoulders;Forward head   Posture Comments severe edema in neck and upper back    AROM   Right Shoulder Flexion 165 Degrees   Right Shoulder ABduction 160 Degrees   Right Shoulder Internal Rotation 65 Degrees   Right Shoulder External Rotation 90 Degrees   Left Shoulder Flexion 125 Degrees   Left Shoulder ABduction 94 Degrees   Left Shoulder Internal Rotation 65 Degrees   Left Shoulder External Rotation 90 Degrees   Strength   Overall Strength Comments all  movements of left arm limited by pain           LYMPHEDEMA/ONCOLOGY QUESTIONNAIRE - 03/01/16 1508    Right Upper Extremity Lymphedema   15 cm Proximal to Olecranon Process 29.5 cm   10 cm Proximal to Olecranon Process 27.5 cm   Olecranon Process 23.5 cm   15 cm Proximal to Ulnar Styloid Process 23.5 cm   10 cm Proximal to Ulnar Styloid Process 22.2 cm   Just Proximal to Ulnar Styloid Process 15.1 cm   Across Hand at [employer] 19 cm  At [facility] of 2nd Digit 5.5 cm   Other severe edema in anterior and posterior upper trunk and neck not able to be measured    Left Upper Extremity Lymphedema   15 cm Proximal to Olecranon Process 29.2 cm   10 cm Proximal to Olecranon Process 27 cm   Olecranon Process 24.5 cm   15 cm Proximal to Ulnar Styloid Process 24 cm   10 cm Proximal to Ulnar Styloid Process 21 cm   Just Proximal to Ulnar Styloid Process 15 cm   Across Hand at [employer] 18.5 cm   At [place] of 2nd Digit 5.5 cm           Quick Dash - 03/01/16 0001    Open a tight or new jar Unable   Do heavy household chores (wash walls, wash floors) Unable   Carry a shopping bag or briefcase Severe difficulty   Wash your back Unable   Use a knife to cut food Moderate difficulty   Recreational activities in which you take some force or impact through your arm, shoulder, or hand (golf, hammering, tennis) Unable   During the past week, to what extent has your arm, shoulder or hand problem interfered with your normal social activities with family, friends, neighbors, or groups? Extremely   During the past week, to what extent has your arm, shoulder or hand problem limited your work or other regular daily activities Extremely   Arm, shoulder, or hand pain. Extreme   Tingling (pins and needles) in your arm, shoulder, or hand Mild   Difficulty Sleeping So much difficuSo much difficulty, I can't sleep   DASH Score 86.36 %             [facility] Adult PT Treatment/Exercise - 03/01/16  0001    Self-Care   Self-Care Other Self-Care Comments   Other Self-Care Comments  deep breathing and brief abdominal manua lymph drainage                 PT Education - 03/01/16 1800    Education provided Yes   Education Details dowel rod flexion  ( gave ABCclass packet )   Person(s) Educated Patient   Methods Explanation;Demonstration;Handout   Comprehension Verbalized understanding;Returned demonstration                [facility] Goals - 03/01/16 1811    CC Long Term Goal  #1   Title Patient will report a decrease in pain by 50% so they can perform daily activities with greater ease   Time 4   Period Weeks   Status New   CC Long Term Goal  #2   Title Patient will improve shoulder flexion range of motion to 150 degrees to perform activities of daily living and household chores with greater ease   Time 4   Period Weeks   Status New   CC Long Term Goal  #3   Title Patient with verbalize an understanding of lymphedema risk reduction precautions   Time 4   Period Weeks   Status New   CC Long Term Goal  #4   Title Patient will be independent in self manual lymph drainage   Time 4   Period Weeks   Status New   CC Long Term Goal  #5   Title Pt will report her edema symptoms in upper body are about 25% better    Time 4   Period Weeks   Status New  Plan - 03/01/16 1802    Clinical Impression Statement 55 yo female with breast cancer with full axillary node dissection and previous chemotherapy neuropathy presents with unrelenting pain and proximal edema from unknown cause. Sent a note to Dr. [name] who she will be seeing tomorrow for guidance about cause of neck and upper back and chest swelling and safety of treatment.    Rehab Potential Good   Clinical Impairments Affecting Rehab Potential chemotherapy with neuropathy    PT Frequency 2x / week   PT Duration 4 weeks  starting in 2 weeks    PT Treatment/Interventions ADLs/Self Care  Home Management;Manual lymph drainage;Compression bandaging;Scar mobilization;Passive range of motion;Patient/family education;Taping;Manual techniques;Therapeutic exercise;Therapeutic activities;Orthotic Fit/Training;DME Instruction   PT Next Visit Plan check Dr. Virgie Dad office note about cause of neck swelling, If ok proceed with manual lymph draiange and range of motion to shoulder.  Sign up for ABC class    Consulted and Agree with Plan of Care Patient      Patient will benefit from skilled therapeutic intervention in order to improve the following deficits and impairments:  Decreased range of motion, Impaired UE functional use, Decreased activity tolerance, Decreased knowledge of precautions, Decreased skin integrity, Impaired perceived functional ability, Pain, Decreased knowledge of use of DME, Increased edema, Postural dysfunction, Decreased strength, Impaired sensation, Decreased scar mobility  Visit Diagnosis: Localized swelling, mass and lump, neck - Plan: PT plan of care cert/re-cert  Localized swelling, mass and lump, trunk - Plan: PT plan of care cert/re-cert  Stiffness of left shoulder joint - Plan: PT plan of care cert/re-cert  Pain in left shoulder - Plan: PT plan of care cert/re-cert     Problem List Patient Active Problem List   Diagnosis Date Noted  . Primary malignant neoplasm of left breast with stage 2 nodal metastasis per American Joint Committee on Cancer 7th edition (N2) ([place]) 02/14/2016  . Breast cancer ([place]) 02/02/2016  . 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 Insomnia 09/01/2015  . Breast cancer of upper-outer quadrant of left female breast ([place]) 08/08/2015   Donato Heinz. Owens Shark PT  Norwood Levo 03/01/2016, 6:18 PM  [place], [place], [address] Phone: [phone]   Fax:  [phone]  Name: TAJIA REICHER MRN: [id number] Date of Birth: 02-07-1961

## 2016-03-02 ENCOUNTER — Encounter: Payer: Self-pay | Admitting: *Deleted

## 2016-03-02 ENCOUNTER — Ambulatory Visit (HOSPITAL_BASED_OUTPATIENT_CLINIC_OR_DEPARTMENT_OTHER): Payer: 59 | Admitting: Oncology

## 2016-03-02 ENCOUNTER — Other Ambulatory Visit: Payer: Self-pay | Admitting: *Deleted

## 2016-03-02 ENCOUNTER — Telehealth: Payer: Self-pay | Admitting: Oncology

## 2016-03-02 ENCOUNTER — Other Ambulatory Visit (HOSPITAL_BASED_OUTPATIENT_CLINIC_OR_DEPARTMENT_OTHER): Payer: 59

## 2016-03-02 ENCOUNTER — Telehealth: Payer: Self-pay

## 2016-03-02 VITALS — BP 132/63 | HR 80 | Temp 99.0°F | Resp 18 | Ht 63.0 in | Wt 152.5 lb

## 2016-03-02 DIAGNOSIS — C773 Secondary and unspecified malignant neoplasm of axilla and upper limb lymph nodes: Secondary | ICD-10-CM | POA: Diagnosis not present

## 2016-03-02 DIAGNOSIS — G893 Neoplasm related pain (acute) (chronic): Secondary | ICD-10-CM | POA: Diagnosis not present

## 2016-03-02 DIAGNOSIS — C50412 Malignant neoplasm of upper-outer quadrant of left female breast: Secondary | ICD-10-CM

## 2016-03-02 LAB — COMPREHENSIVE METABOLIC PANEL
ALBUMIN: 3.8 g/dL (ref 3.5–5.0)
ALT: 23 U/L (ref 0–55)
ANION GAP: 12 meq/L — AB (ref 3–11)
AST: 24 U/L (ref 5–34)
Alkaline Phosphatase: 41 U/L (ref 40–150)
BILIRUBIN TOTAL: 0.3 mg/dL (ref 0.20–1.20)
BUN: 12.8 mg/dL (ref 7.0–26.0)
CALCIUM: 9.8 mg/dL (ref 8.4–10.4)
CO2: 20 meq/L — AB (ref 22–29)
CREATININE: 0.8 mg/dL (ref 0.6–1.1)
Chloride: 108 mEq/L (ref 98–109)
EGFR: 80 mL/min/{1.73_m2} — ABNORMAL LOW (ref >90)
GLUCOSE: 150 mg/dL — AB (ref 70–140)
Potassium: 4 mEq/L (ref 3.5–5.1)
Sodium: 140 mEq/L (ref 136–145)
TOTAL PROTEIN: 7 g/dL (ref 6.4–8.3)

## 2016-03-02 LAB — CBC WITH DIFFERENTIAL/PLATELET
BASO%: 1.3 % (ref 0.0–2.0)
BASOS ABS: 0.1 10*3/uL (ref 0.0–0.1)
EOS ABS: 0.4 10*3/uL (ref 0.0–0.5)
EOS%: 8.6 % — ABNORMAL HIGH (ref 0.0–7.0)
HCT: 32.8 % — ABNORMAL LOW (ref 34.8–46.6)
HEMOGLOBIN: 10.8 g/dL — AB (ref 11.6–15.9)
LYMPH%: 19.9 % (ref 14.0–49.7)
MCH: 30.5 pg (ref 25.1–34.0)
MCHC: 33 g/dL (ref 31.5–36.0)
MCV: 92.5 fL (ref 79.5–101.0)
MONO#: 0.4 10*3/uL (ref 0.1–0.9)
MONO%: 9.5 % (ref 0.0–14.0)
NEUT%: 60.7 % (ref 38.4–76.8)
NEUTROS ABS: 2.5 10*3/uL (ref 1.5–6.5)
PLATELETS: 306 10*3/uL (ref 145–400)
RBC: 3.54 10*6/uL — ABNORMAL LOW (ref 3.70–5.45)
RDW: 15.2 % — AB (ref 11.2–14.5)
WBC: 4.1 10*3/uL (ref 3.9–10.3)
lymph#: 0.8 10*3/uL — ABNORMAL LOW (ref 0.9–3.3)

## 2016-03-02 MED ORDER — ESZOPICLONE 2 MG PO TABS: 2.0000 mg | ORAL_TABLET | Freq: Every day | ORAL | Status: DC

## 2016-03-02 MED ORDER — OXYCODONE HCL 5 MG PO TABS: 5.0000 mg | ORAL_TABLET | Freq: Four times a day (QID) | ORAL | Status: DC | PRN

## 2016-03-02 MED ORDER — OXYCODONE HCL ER 20 MG PO T12A: 20.0000 mg | EXTENDED_RELEASE_TABLET | Freq: Two times a day (BID) | ORAL | Status: DC

## 2016-03-02 MED ORDER — CAPECITABINE 500 MG PO TABS
1000.0000 mg | ORAL_TABLET | Freq: Two times a day (BID) | ORAL | Status: AC
Start: 2016-03-23 — End: 2016-05-11

## 2016-03-02 MED ORDER — GABAPENTIN 300 MG PO CAPS: 300.0000 mg | ORAL_CAPSULE | Freq: Three times a day (TID) | ORAL | Status: DC

## 2016-03-02 MED ORDER — METHOCARBAMOL 500 MG PO TABS: 500.0000 mg | ORAL_TABLET | Freq: Four times a day (QID) | ORAL | Status: DC | PRN

## 2016-03-02 NOTE — Telephone Encounter (Signed)
RX Xeloda sent to [facility].  Followed up with them and found out patient has a $1349 copay with [employer].  Bethena Roys from [facility] is checking with Christella Scheuermann to see if mail order is cheaper, if not then will attempt copay assistance program.    Follow up next week on status.  Thank you  Henreitta Leber, PharmD [facility]

## 2016-03-02 NOTE — Progress Notes (Signed)
Alison [name]  [phone] [phone]   ID: [name] DOB: [birth year]  MR#: [id number]  DXI#:[id number]  Patient Care Team: Alison Pao, MD as PCP - General (Internal Medicine) Alison Luna, MD as Consulting Physician (General Surgery) Alison Cruel, MD as Consulting Physician (Oncology) Alison Gell, MD as Consulting Physician (Obstetrics and Gynecology) Alison Sine, MD as Consulting Physician (Dermatology) Alison Gibson, MD as Attending Physician (Radiation Oncology) PCP: Alison Pao, MD OTHER MD:  CHIEF COMPLAINT: Estrogen receptor positive breast cancer  CURRENT TREATMENT:   BREAST CANCER HISTORY: From the original intake note:  Alison [name] had routine screening mammography with tomography at [facility] February 20 11/14/2014 showing a possible mass in the right breast. Diagnostic right mammography with right breast ultrasonography 11/01/2014 found the breast density to be category C. In the upper outer quadrant of the right breast there was a partially obscured mass which on physical exam was palpable as an area of thickening at the 10:00 position 8 cm from the nipple. Ultrasound showed multiple cysts in the upper outer right breast corresponding to the mass in question.  In November 2016 the patient felt a change in the upper left breast and brought it to her primary care physician's attention. Diagnostic left mammography with tomosynthesis and left breast ultrasonography at the [facility] December 01/14/2015 found trabecular thickening throughout the left breast with skin thickening, but no circumscribed mass or suspicious calcifications. A rounded mass in the left axilla measured 6 mm. There was an additional mildly prominent lymph node in the left axilla which was what the patient had been palpating. Ultrasonography showed ill-defined swelling throughout the inner left breast with overlying skin thickening.. At the 2:00  position 4 cm from the nipple there was an irregular hypoechoic mass measuring 2.3 cm. A second mass at the 1:00 area 3 cm from the nipple measured 1 cm. The distance between these 2 masses was 1.8 cm. There was also a round hypoechoic left axillary mass measuring 0.6 cm. Overall the was an area of 3.7 cm of mixed echogenicity corresponding to the area of palpable soft tissue swelling.   On 08/04/2015 the patient underwent biopsy of the mass at the 2:00 axis 4 cm from the nipple and a second biopsy was performed of the hypoechoic mass at the 1:00 position 3 cm from the nipple. Biopsy of the suspicious nodule in the lower left axilla was attempted but could not be completed as the mass became obscured after administration of lidocaine.  The pathology from both left breast biopsies 08/04/2015 ([id number]) showed invasive ductal carcinoma, grade 3. The prognostic profile obtained from the larger tumor showed estrogen receptor to be strongly positive at 95%, progesterone receptor positive at 30%, with an MIB-1 of 12%, and no HER-2 amplification, the signals ratio being 1.22 and the number per cell 2.20.  On 08/09/2015 the patient underwent biopsy of this suspicious left axillary lymph node noted above, and this showed ([id number]) metastatic carcinoma with extracapsular extension.  The patient's subsequent history is as detailed below  INTERVAL HISTORY: Alison [name] returns for follow up of her left-sided breast cancer accompanied by her husband Alison [name]. Since her last visit here she underwent left mastectomy with targeted axillary dissection 02/02/2016. The pathology from this procedure ([id number]) showed residual invasive ductal carcinoma, grade 2, measuring 10 cm. Margins were negative. The single sentinel lymph node was positive. 3 additional lymph nodes had micrometastatic tumor deposits in them. The repeat prognostic panel showed  this tumor to be estrogen receptor 60% positive with weak staining intensity,  progesterone receptor negative, and HER-2 nonamplified the signals ratio of 1.0 and number per cell 2.0. The patient's port was removed during this procedure, and she had an expander placed with a view to eventual implant reconstruction.  On 02/14/2016 the patient underwent completion left axillary lymph node dissection. The final results ([id number]) showed an additional 2 out of an additional 8 lymph nodes involved, so that the final lymph node count was 6 positive out of 10 sampled left axillary lymph nodes.  Her case was re-presented at the breast cancer multidisciplinary conference June 20 817. At that time it was felt the patient would benefit from capecitabine adjuvant chemotherapy starting concurrently with radiation. This would be followed by anti-estrogen treatment.  REVIEW OF SYSTEMS: Alison [name] did well with her first surgery, but has had considerable pain in her left arm and left chest wall since the second surgery. This is not well-controlled on her current narcotics and Robaxin dose. She has also developed left upper extremity lymphedema and more importantly left supraclavicular and left chest wall lymphedema. She started physical therapy yesterday.  PAST MEDICAL HISTORY: Past Medical History  Diagnosis Date  . Hypertension   . Raynaud's disease   . Rectovaginal fistula     repaired 2014  . Osteopenia determined by x-ray   . Complication of anesthesia 2007    aspiration pneumonia post hysterectomy.  Blood pressure would drop low- but no problems 01/2016- surgery  . Asthma      seasonal asthma  . Pneumonia     1980's and 1990's  . Neuropathy ([place])     with chemo  . History of blood transfusion   . Skin cancer 2014    basal- back  . Breast cancer ([place])     PAST SURGICAL HISTORY: Past Surgical History  Procedure Laterality Date  . C-section    . Abdominal hysterectomy  2007    with BSO  . Appendectomy  2007  . Colon resection  4/'2014  . Hernia repair    . Radioactive  seed guided axillary sentinel lymph node Left 02/02/2016    Procedure: LEFT BREAST MASTECTOMY AND RADIOACTIVE SEED GUIDED LEFT SENTINEL LYMPH NODE BIOPSY ;  Surgeon: Alison Klein, MD;  Location: [facility];  Service: General;  Laterality: Left;  . Port-a-cath removal Right 02/02/2016    Procedure: REMOVAL PORT-A-CATH;  Surgeon: Alison Klein, MD;  Location: [facility];  Service: General;  Laterality: Right;  . Breast reconstruction with placement of tissue expander and flex hd (acellular hydrated dermis) Left 02/02/2016    Procedure: IMMEDIATE LEFT BREAST RECONSTRUCTION WITH PLACEMENT OF TISSUE EXPANDER AND FLEX HD (ACELLULAR HYDRATED DERMIS);  Surgeon: [name], DO;  Location: [facility];  Service: Plastics;  Laterality: Left;  . Breast surgery Left 02/02/16    mastectomy with reconstruction  . Port- a- cath placement  07/2015  . Axillary lymph node dissection Left 02/14/2016  . Axillary lymph node dissection Left 02/14/2016    Procedure: LEFT AXILLARY LYMPH NODE DISSECTION;  Surgeon: Alison Klein, MD;  Location[facility];  Service: General;  Laterality: Left;    FAMILY HISTORY Family History  Problem Relation Age of Onset  . Lung cancer Father    the patient's father died from lung cancer at the age of 83 in the setting of tobacco abuse. The patient's mother died at the age of 64 with pancreatic cancer. The patient had  no siblings. There is no history of breast or ovarian cancer in the family.  GYNECOLOGIC HISTORY:  No LMP recorded. Patient has had a hysterectomy. Menarche age 68, first live birth age 82. The patient is GX P1. She is status post total hysterectomy with bilateral salpingo-oophorectomy. She has been on hormone replacement since that time and is tapering to off at the time of this dictation.   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 She used to work at [employer]. Her husband [name] works for [employer] division at a  [employer] their son [name] is currently at home.    ADVANCED DIRECTIVES: Not in place   HEALTH MAINTENANCE: Social History  Substance Use Topics  . Smoking status: Former Smoker -- 1.00 packs/day for 10 years    Types: Cigarettes    Quit date: 11/25/2008  . Smokeless tobacco: Never Used     Comment: on and off smoker never consistent  . Alcohol Use: 2.4 oz/week    4 Glasses of wine per week     Colonoscopy: April 2014/ Raynaldo Opitz at [facility]  PAP: s/p hysterectomy  Bone density: August 2016/ osteopenia  Lipid panel:  Allergies  Allergen Reactions  . Sulfa Antibiotics Other (See Comments)    Blisters in mouth  . Zofran [Ondansetron Hcl] Other (See Comments)    Severe Headaches     Current Outpatient Prescriptions  Medication Sig Dispense Refill  . cetirizine (ZYRTEC) 10 MG tablet Take 10 mg by mouth daily.    . Cholecalciferol (VITAMIN D3) 2000 UNITS capsule Take 2,000 Units by mouth daily.     . eszopiclone (LUNESTA) 2 MG TABS tablet TAKE ONE TABLET AT BEDTIME 30 tablet 0  . Fenofibric Acid 105 MG TABS Take 1 tablet by mouth daily.    . fluconazole (DIFLUCAN) 100 MG tablet Take 1 tablet (100 mg total) by mouth daily. 30 tablet 0  . fluticasone (FLONASE) 50 MCG/ACT nasal spray Place 2 sprays into both nostrils daily. Reported on 11/18/2015    . gabapentin (NEURONTIN) 300 MG capsule Take 100 mg by mouth 3 (three) times daily. Patient takes 100 mg 3 times a day    . HYDROcodone-acetaminophen (NORCO/VICODIN) 5-325 MG tablet Take 1 tablet by mouth every 4 (four) hours as needed for moderate pain.    Marland Kitchen ibuprofen (ADVIL,MOTRIN) 200 MG tablet Take 400 mg by mouth every 6 (six) hours as needed for headache, mild pain or moderate pain. Reported on 01/06/2016    . loratadine (CLARITIN) 10 MG tablet Take 10 mg by mouth daily.    Marland Kitchen LORazepam (ATIVAN) 0.5 MG tablet Take 0.5 mg by mouth at bedtime.    . methocarbamol (ROBAXIN) 500 MG tablet Take 1 tablet (500 mg total) by  mouth every 6 (six) hours as needed for muscle spasms. 40 tablet 1  . Multiple Vitamin (MULTIVITAMIN) tablet Take 1 tablet by mouth daily.    Marland Kitchen NIFEdipine (PROCARDIA-XL/ADALAT-CC/NIFEDICAL-XL) 30 MG 24 hr tablet Take 30 mg by mouth daily.    Marland Kitchen olmesartan (BENICAR) 20 MG tablet Take 1 tablet (20 mg total) by mouth daily. 90 tablet 4  . omega-3 acid ethyl esters (LOVAZA) 1 g capsule Take 1 g by mouth 2 (two) times daily.    Marland Kitchen omeprazole (PRILOSEC) 40 MG capsule TAKE ONE CAPSULE EACH DAY 30 capsule 2  . oxyCODONE (OXY IR/ROXICODONE) 5 MG immediate release tablet Take 1-2 tablets (5-10 mg total) by mouth every 6 (six) hours as needed for moderate pain, severe  pain or breakthrough pain. 60 tablet 0  . polyethylene glycol (MIRALAX / GLYCOLAX) packet Take 17 g by mouth daily as needed for mild constipation. Reported on 01/06/2016    . Probiotic Product (PROBIOTIC DAILY PO) Take 1 capsule by mouth daily. [place] prochlorperazine (COMPAZINE) 10 MG tablet Take 1 tablet (10 mg total) by mouth every 6 (six) hours as needed (Nausea or vomiting). 30 tablet 1  . traMADol (ULTRAM) 50 MG tablet TAKE ONE TABLET EVERY 6 HOURS AS NEEDED 30 tablet 2  . traZODone (DESYREL) 150 MG tablet Take 150 mg by mouth at bedtime.    . valACYclovir (VALTREX) 500 MG tablet TAKE ONE TABLET TWICE DAILY 30 tablet 1   No current facility-administered medications for this visit.    OBJECTIVE: Middle-aged white woman in no acute distress There were no vitals filed for this visit.   There is no weight on file to calculate BMI.    ECOG FS:1 - Symptomatic but completely ambulatory  Skin: warm, dry  HEENT: sclerae anicteric, conjunctivae pink, oropharynx clear. No thrush or mucositis.  Lymph Nodes: No cervical or supraclavicular lymphadenopathy  Lungs: clear to auscultation bilaterally, no rales, wheezes, or rhonci  Heart: regular rate and rhythm  Abdomen: round, soft, non tender, positive  bowel sounds  Musculoskeletal: No focal spinal tenderness, no peripheral edema  Neuro: non focal, well oriented, positive affect  Breasts: deferred  LAB RESULTS:  CMP     Component Value Date/Time   NA 138 02/15/2016 0330   NA 139 01/06/2016 1321   K 4.3 02/15/2016 0330   K 4.1 01/06/2016 1321   CL 110 02/15/2016 0330   CO2 21* 02/15/2016 0330   CO2 24 01/06/2016 1321   GLUCOSE 118* 02/15/2016 0330   GLUCOSE 146* 01/06/2016 1321   BUN 10 02/15/2016 0330   BUN 9.1 01/06/2016 1321   CREATININE 0.80 02/15/2016 0330   CREATININE 0.8 01/06/2016 1321   CALCIUM 9.5 02/15/2016 0330   CALCIUM 9.4 01/06/2016 1321   PROT 6.4 01/06/2016 1321   PROT 7.1 11/12/2015 0930   ALBUMIN 3.3* 01/06/2016 1321   ALBUMIN 4.0 11/12/2015 0930   AST 21 01/06/2016 1321   AST 23 11/12/2015 0930   ALT 17 01/06/2016 1321   ALT 18 11/12/2015 0930   ALKPHOS 35* 01/06/2016 1321   ALKPHOS 37* 11/12/2015 0930   BILITOT <0.30 01/06/2016 1321   BILITOT 0.8 11/12/2015 0930   GFRNONAA >60 02/15/2016 0330   GFRAA >60 02/15/2016 0330    INo results found for: SPEP, UPEP  Lab Results  Component Value Date   WBC 7.4 02/15/2016   NEUTROABS 3.2 01/06/2016   HGB 8.5* 02/15/2016   HCT 26.3* 02/15/2016   MCV 91.6 02/15/2016   PLT 554* 02/15/2016      Chemistry      Component Value Date/Time   NA 138 02/15/2016 0330   NA 139 01/06/2016 1321   K 4.3 02/15/2016 0330   K 4.1 01/06/2016 1321   CL 110 02/15/2016 0330   CO2 21* 02/15/2016 0330   CO2 24 01/06/2016 1321   BUN 10 02/15/2016 0330   BUN 9.1 01/06/2016 1321   CREATININE 0.80 02/15/2016 0330   CREATININE 0.8 01/06/2016 1321      Component Value Date/Time   CALCIUM 9.5 02/15/2016 0330   CALCIUM 9.4 01/06/2016 1321   ALKPHOS 35* 01/06/2016 1321   ALKPHOS 37* 11/12/2015 0930   AST 21 01/06/2016 1321  AST 23 11/12/2015 0930   ALT 17 01/06/2016 1321   ALT 18 11/12/2015 0930   BILITOT <0.30 01/06/2016 1321   BILITOT 0.8 11/12/2015 0930         No results found for: LABCA2  No components found for: ZOXWR604  No results for input(s): INR in the last 168 hours.  Urinalysis    Component Value Date/Time   COLORURINE YELLOW 11/12/2015 [place] 11/12/2015 0837   LABSPEC 1.006 11/12/2015 0837   PHURINE 6.5 11/12/2015 0837   GLUCOSEU NEGATIVE 11/12/2015 0837   HGBUR MODERATE* 11/12/2015 0837   BILIRUBINUR NEGATIVE 11/12/2015 0837   KETONESUR NEGATIVE 11/12/2015 0837   PROTEINUR NEGATIVE 11/12/2015 0837   NITRITE NEGATIVE 11/12/2015 0837   LEUKOCYTESUR MODERATE* 11/12/2015 0837    STUDIES: Nm Sentinel Node Inj-no Rpt (breast)  02/02/2016  CLINICAL DATA: left breast cancer Sulfur colloid was injected intradermally by the nuclear medicine technologist for breast cancer sentinel node localization.   Mm Breast Surgical Specimen  02/02/2016  CLINICAL DATA:  55 year old female -evaluate surgical specimen of sentinel metastatic left axillary lymph node. EXAM: SPECIMEN RADIOGRAPH OF THE LEFT AXILLA COMPARISON:  Previous exam(s). FINDINGS: Status post excision of the left axilla. The radioactive seed and biopsy marker clip are present, completely intact, and were marked for pathology. IMPRESSION: Specimen radiograph of the left axilla. Electronically Signed   By: Margarette Canada M.D.   On: 02/02/2016 13:15   Mm Digital Diagnostic Unilat L  02/02/2016  CLINICAL DATA:  Evaluate seed location following ultrasound-guided left axillary radioactive seed placement. EXAM: DIAGNOSTIC LEFT MAMMOGRAM POST ULTRASOUND-GUIDED RADIOACTIVE SEED PLACEMENT COMPARISON:  Previous exam(s). FINDINGS: A single view of the upper left breast/ left axilla was obtained following ultrasound-guided radioactive seed placement. This demonstrates satisfactory placement of the radioactive seed immediately adjacent to the biopsy clip. IMPRESSION: Appropriate location of the radioactive seed. Final Assessment: Post Procedure Mammograms for Seed Placement  Electronically Signed   By: Margarette Canada M.D.   On: 02/02/2016 09:29   [place] Radioactive Seed Loc  02/02/2016  CLINICAL DATA:  55 year old female for localization of left axillary lymph node metastasis, prior to mastectomy and lymph node excision. EXAM: ULTRASOUND GUIDED RADIOACTIVE SEED LOCALIZATION OF THE LEFT AXILLA COMPARISON:  Previous exam(s). FINDINGS: Patient presents for radioactive seed localization prior to left mastectomy and lymph node excision. I met with the patient and we discussed the procedure of seed localization including benefits and alternatives. We discussed the high likelihood of a successful procedure. We discussed the risks of the procedure including infection, bleeding, tissue injury and further surgery. We discussed the low dose of radioactivity involved in the procedure. Informed, written consent was given. The usual time-out protocol was performed immediately prior to the procedure. Using ultrasound guidance, sterile technique, 1% lidocaine and an I-125 radioactive seed, the HydroMARK clip was localized using a lateral approach. The follow-up mammogram images confirm the seed in the expected location and were marked for Dr. Barry Dienes. Follow-up survey of the patient confirms presence of the radioactive seed. Order number of I-125 seed:  [id number]. Total activity: 4.782 millicuries Reference Date: 01/26/2016 The patient tolerated the procedure well and was released from the [facility]. She was given instructions regarding seed removal. IMPRESSION: Radioactive seed localization left axilla. No apparent complications. Electronically Signed   By: Margarette Canada M.D.   On: 02/02/2016 09:32   PROTOCOLS: B=WELL, ABC and PALLAS under scrutiny  ASSESSMENT: 55 y.o. [place] woman status post 2 separate masses in the left  breast upper outer quadrant 08/04/2015, clinically mT2 N1, stage IIb/IIIa invasive ductal carcinoma, grade 3, the larger mass being estrogen and progesterone receptor  positive, HER-2 negative, with an MIB-1 of 12%  (a) biopsy of a left axillary lymph node 08/09/2015 was positive, with extracapsular extension  (1) neoadjuvant chemotherapy started 08/25/2015 consisting of doxorubicin and cyclophosphamide in dose dense fashion 4, completed 10/06/2015,  followed by weekly paclitaxel 10 completed 12/23/2015  (a) final 2 planned cycles of weekly paclitaxel omitted because of neuropathy concerns  (2) status post left mastectomy with targeted axillary dissection 02/02/2016 for a ypT2 ypN2a, stage IIIa invasive ductal carcinoma, grade 2, with negative margins, repeat prognostic panel showing estrogen receptor positive, but progesterone receptor and HER-2 negative  (a) completion axillary lymph node dissection 02/14/2016 showed an additional 2 out of 8 lymph nodes to be involved (final count 6 out of 10 lymph nodes positive  (b) expander placement at the time of left mastectomy  (3) adjuvant capecitabine to follow, starting concurrently with radiation  (4) adjuvant  radiation to follow surgery  (5) anti-estrogens to follow completion of local treatment  (a) bone density August 2016 showed osteopenia  (b) the patient is status post total abdominal hysterectomy with bilateral salpingo-oophorectomy.  (6) postoperative pain: Starting OxyContin 20 mg twice a day with oxycodone as needed for breakthrough pain 03/02/2016  (Bowel prophylaxis in placea)   PLAN: I spent approximately an hour today with Magda Paganini and her husband going over her situation. She understands she had significant residual disease in her breast and lymph nodes despite the chemotherapy she received. Accordingly in addition to completing her local treatment with radiation, she will receive capecitabine. We are doing this in 2 parts. During radiation she will receive 1000 mg twice daily on radiation days, and that will service chemosensitization. When she completes the radiation she will start full dose  capecitabine at 1500 mg twice daily 1 week on and one-week off. We will continue that for an additional 4 months.  When she completes the capecitabine she will start the antiestrogen's. She is very interested in participating in the [place] study and we have already started the paperwork in that regard. (She also qualifies for ABC and be well).  Because she ended up being stage III and she hasn't been fully staged, I am setting her up for CT of the chest and a bone scan on August 7, after she comes back from her beach trip. She will see me the next day to discuss results and to start the capecitabine.  For postoperative pain, I am starting her on OxyContin 20 mg twice daily, and I wrote her for 60 tablets today. She will take oxycodone 1 or 2 tablets every 2 hours as needed and I wrote her for 120 tablets today. I asked her to call us in the middle of next week to let us know how this is working for her so we can make any necessary adjustments.  [place] has a good understanding of this plan. She agrees with it. She will meet with the radiation physician Dr. Isidore Moos at the end of this month and likely start radiation the following week.    Alison Cruel, MD   03/02/2016 8:10 AM

## 2016-03-02 NOTE — Telephone Encounter (Signed)
appt made and avs printed. CT and bone scan to be scheduled by central radiology

## 2016-03-05 ENCOUNTER — Other Ambulatory Visit: Payer: 59

## 2016-03-06 ENCOUNTER — Other Ambulatory Visit: Payer: Self-pay | Admitting: *Deleted

## 2016-03-06 MED ORDER — ESZOPICLONE 2 MG PO TABS: 2.0000 mg | ORAL_TABLET | Freq: Every day | ORAL | Status: DC

## 2016-03-07 ENCOUNTER — Other Ambulatory Visit: Payer: Self-pay | Admitting: *Deleted

## 2016-03-07 ENCOUNTER — Encounter: Payer: Self-pay | Admitting: Pharmacist

## 2016-03-07 NOTE — Progress Notes (Signed)
I s/w Bethena Roys, tech at [employer] [facility].  She is going to contact pt today (03/07/16) and apply to [employer] for a grant to help w/ Xeloda.  We'll f/u on 03/09/16 w/ [facility].  Kennith Center, Pharm.D., CPP 03/07/2016@11 :09 AM [facility]

## 2016-03-09 ENCOUNTER — Telehealth: Payer: Self-pay | Admitting: Pharmacist

## 2016-03-09 NOTE — Telephone Encounter (Signed)
Received email from [place] @ [facility]. Judy left a VM for patient on 7/12 regarding needing her financial documents to apply to patient advocate foundation for grant to help with copay for Xeloda. Pt has not returned the call to [place]. Will f/u next week.   Raul Del, PharmD, BCPS, [facility] [phone]

## 2016-03-12 ENCOUNTER — Telehealth: Payer: Self-pay | Admitting: Pharmacist

## 2016-03-12 NOTE — Telephone Encounter (Signed)
Rcvd email from [place] at [facility]. Judy left VM for patient on 7/12 regarding needing her financial documents to apply  to patient advocate foundation for grant funds to help with copay assistance for Xeloda. Pt has not returned Judy's phone call. The Xeloda Rx has been put on hold at [place].  Raul Del, PharmD, BCPS, [facility] [phone]

## 2016-03-13 ENCOUNTER — Telehealth: Payer: Self-pay | Admitting: *Deleted

## 2016-03-14 ENCOUNTER — Encounter: Payer: Self-pay | Admitting: Pharmacist

## 2016-03-14 ENCOUNTER — Encounter: Payer: Self-pay | Admitting: *Deleted

## 2016-03-14 ENCOUNTER — Telehealth: Payer: Self-pay | Admitting: Pharmacist

## 2016-03-14 NOTE — Progress Notes (Signed)
Location of Breast Cancer: Left Breast  Histology per Pathology Report:  02/02/16 Diagnosis 1. Breast, simple mastectomy, left - INVASIVE DUCTAL CARCINOMA GRADE II/III, SPANNING 10.0 CM. - LOBULAR NEOPLASIA (LOBULAR CARCINOMA IN SITU). - THE SURGICAL RESECTION MARGINS ARE NEGATIVE FOR INVASIVE CARICNOMA. - SEE ONCOLOGY TABLE BELOW. 2. Lymph node, sentinel, biopsy, Left Axillary #1 - METASTATIC CARCINOMA IN 1 OF 1 LYMPH NODE (1/1). 3. Lymph nodes, regional resection, Left Axillary Contents - MICROMETASTATIC CARCINOMA IN 3 OF 3 LYMPH NODES (3/3).  Receptor Status: ER(60%), PR (0%), Her2-neu (NEG), Ki-()  02/14/16 Diagnosis Lymph nodes, regional resection, Left Axillary Contents - METASTATIC CARCINOMA WITH IN TWO OF SIX LYMPH NODES (2/6, ONE MACROMETASTASIS AND ONE MICROMETASTASIS), SEE COMMENT. - SOFT TISSUE WITH FAT NECROSIS.     Did patient present with symptoms or was this found on screening mammography?: She self palpated it herself in November 2016.  Past/Anticipated interventions by surgeon, if any: 02/02/16 PROCEDURE: Procedure(s): LEFT BREAST MASTECTOMY AND RADIOACTIVE SEED GUIDED LEFT SENTINEL LYMPH NODE BIOPSY (Left) MASTECTOMY MODIFIED RADICAL (Left) IMMEDIATE LEFT BREAST RECONSTRUCTION WITH PLACEMENT OF TISSUE EXPANDER AND FLEX HD (ACELLULAR HYDRATED DERMIS) (Left)  SURGEON: Surgeon(s) and Role: Panel 1:  * [name], MD - Primary Panel 2:  * [place], DO - Primary  02/14/16 PROCEDURE: Procedure(s): Left axillary lymph node dissection SURGEON: Surgeon(s): [name], MD  Past/Anticipated interventions by medical oncology, if any: Dr. [name] 03/02/16 [place] woman status post 2 separate masses in the left breast upper outer quadrant 08/04/2015, clinically mT2 N1, stage IIb/IIIa invasive ductal carcinoma, grade 3, the larger mass being estrogen and progesterone receptor positive, HER-2 negative, with an MIB-1 of 12% (a)  biopsy of a left axillary lymph node 08/09/2015 was positive, with extracapsular extension (1) neoadjuvant chemotherapy started 08/25/2015 consisting of doxorubicin and cyclophosphamide in dose dense fashion 4, completed 10/06/2015, followed by weekly paclitaxel 10 completed 12/23/2015 (a) final 2 planned cycles of weekly paclitaxel omitted because of neuropathy concerns (2) status post left mastectomy with targeted axillary dissection 02/02/2016 for a ypT2 ypN2a, stage IIIa invasive ductal carcinoma, grade 2, with negative margins, repeat prognostic panel showing estrogen receptor positive, but progesterone receptor and HER-2 negative (a) completion axillary lymph node dissection 02/14/2016 showed an additional 2 out of 8 lymph nodes to be involved (final count 6 out of 10 lymph nodes positive (b) expander placement at the time of left mastectomy (3) adjuvant capecitabine to follow, starting concurrently with radiation (4) adjuvant radiation to follow surgery (5) anti-estrogens to follow completion of local treatment PLAN: I spent approximately an hour today with Magda Paganini and her husband going over her situation. She understands she had significant residual disease in her breast and lymph nodes despite the chemotherapy she received. Accordingly in addition to completing her local treatment with radiation, she will receive capecitabine. We are doing this in 2 parts. During radiation she will receive 1000 mg twice daily on radiation days, and that will service chemosensitization. When she completes the radiation she will start full dose capecitabine at 1500 mg twice daily 1 week on and one-week off. We will continue that for an additional 4 months.  Lymphedema issues, if any:  Yes, her left arm has improved. However she has swelling to her Left neck, upper to mid left back. She is working with PT twice weekly.  Pain issues, if any:  She rates it a 2/10 to  her Left Breast area. She is taking oxycontin 47m twice daily for this pain. She is using  very little breakthrough oxycodone since starting the oxycontin.   SAFETY ISSUES:  Prior radiation? No  Pacemaker/ICD? No  Possible current pregnancy? No  Is the patient on methotrexate? No  Current Complaints / other details:   GYNECOLOGIC HISTORY:  No LMP recorded. Patient has had a hysterectomy. Menarche age 13, first live birth age 19.  The patient is GX P1.  She is status post total hysterectomy with bilateral salpingo-oophorectomy.  She has been on hormone replacement since that time and is tapering to off at the time of this dictation (note dictated 03/02/16 Dr. [name])  BP (!) 108/55   Pulse 89   Temp 97.9 F (36.6 C)   Ht 5' 3" (1.6 m)   Wt 155 lb 11.2 oz (70.6 kg)   BMI 27.58 kg/m     Wt Readings from Last 3 Encounters:  03/23/16 155 lb 11.2 oz (70.6 kg)  03/02/16 152 lb 8 oz (69.2 kg)  02/14/16 151 lb 14.4 oz (68.9 kg)      Renu Asby, Stephani Police, RN 03/14/2016,9:19 AM

## 2016-03-14 NOTE — Progress Notes (Signed)
Per Bethena Roys at [facility]: "Due to a high income she will not qualify for patient advocate. she will call her pharmacy benefits to see if she can get anymore info out of them. she will call me Monday to let me know what she plans." Kennith Center, Pharm.D., CPP 03/14/2016@3 :28 PM

## 2016-03-14 NOTE — Telephone Encounter (Signed)
I s/w pt over phone.  [place] the beach since [place]16 and returns 03/17/16. I informed her [facility] has been awaiting her call back re: filling out paperwork for Patient [place] co-pay assistance for Xeloda. I gave pt the phone # to [facility] and told her to ask for [place].  She plans to call today.  We'll f/u after pt returns from the beach. Kennith Center, Pharm.D., CPP 03/14/2016@3 :02 PM [facility] [place]

## 2016-03-14 NOTE — Telephone Encounter (Signed)
Pt provided another contact # (pts cell#) = [phone]  Kennith Center, Pharm.D., CPP 03/14/2016@3 :09 PM [facility]

## 2016-03-15 ENCOUNTER — Other Ambulatory Visit: Payer: Self-pay | Admitting: Oncology

## 2016-03-15 ENCOUNTER — Telehealth: Payer: Self-pay | Admitting: *Deleted

## 2016-03-15 MED ORDER — OXYCODONE HCL 5 MG PO TABS: 5.0000 mg | ORAL_TABLET | ORAL | Status: DC | PRN

## 2016-03-15 MED FILL — CAPECITABINE 500 MG TABLET: 500 | 30 days supply | Qty: 120 | Fill #0

## 2016-03-15 NOTE — Progress Notes (Signed)
Alison [name] I wonder what other resources we can make available to her or if we can help her with her insurance company  This is a standard treatment not an experimental one.  Thanks!  GM

## 2016-03-15 NOTE — Telephone Encounter (Signed)
This RN spoke with pt per her call with update on pain management with initiating q 12 hour oxycontin 20 mg with oxycodone 5 mg q 6 hours for breakthrough pain.  Alison [name] states overall pain has improved but is still having to use the oxycodone on a routine basis- pain is still present " just not as bad ".  Pain is interfering with her ADL's- note pt verbalized concern " I do not want to be a baby about it so I am toughing it out " " I just really dread having to take a shower or put on my make up - things I am used to doing "  This RN discussed with pt goal with pain control is least amount of pain, with greatest function and control of any side effects.  Plan per call is pt will increase current oxycontin to 20 mg tid over the weekend- she can use the oxycodone every 4 hours if needed.  Note pt is presently at the beach and will have son pick up prescription for oxycodone so she can obtain over the weekend upon her return.  Alison [name] is having daily BM with use of miralax.  Alison [name] verbalized understanding of above and appreciation of discussion.

## 2016-03-20 ENCOUNTER — Ambulatory Visit: Payer: 59 | Admitting: Physical Therapy

## 2016-03-20 ENCOUNTER — Other Ambulatory Visit: Payer: Self-pay | Admitting: *Deleted

## 2016-03-20 DIAGNOSIS — R221 Localized swelling, mass and lump, neck: Secondary | ICD-10-CM | POA: Diagnosis not present

## 2016-03-20 DIAGNOSIS — M25512 Pain in left shoulder: Secondary | ICD-10-CM

## 2016-03-20 DIAGNOSIS — R222 Localized swelling, mass and lump, trunk: Secondary | ICD-10-CM

## 2016-03-20 DIAGNOSIS — M25612 Stiffness of left shoulder, not elsewhere classified: Secondary | ICD-10-CM

## 2016-03-20 MED ORDER — OXYCODONE HCL ER 20 MG PO T12A: 40.0000 mg | EXTENDED_RELEASE_TABLET | Freq: Two times a day (BID) | ORAL | 0 refills | Status: DC

## 2016-03-20 NOTE — Therapy (Signed)
[place], [place], [address] Phone: [phone]   Fax:  [phone]  Physical Therapy Treatment  Patient Details  Name: Alison [name] MRN: [id number] Date of Birth: [birth year] Referring Provider: Dr.Byerly   Encounter Date: 03/20/2016      PT End of Session - 03/20/16 1731    Visit Number 2   Number of Visits 9   Date for PT Re-Evaluation 04/26/16   PT Start Time 7893   PT Stop Time 1430   PT Time Calculation (min) 45 min   Activity Tolerance Patient tolerated treatment well   Behavior During Therapy [facility] for tasks assessed/performed      Past Medical History:  Diagnosis Date  . Asthma     seasonal asthma  . Breast cancer ([place])   . Complication of anesthesia 2007   aspiration pneumonia post hysterectomy.  Blood pressure would drop low- but no problems 01/2016- surgery  . History of blood transfusion   . Hypertension   . Neuropathy ([place])    with chemo  . Osteopenia determined by x-ray   . Pneumonia    1980's and 1990's  . Raynaud's disease   . Rectovaginal fistula    repaired 2014  . Skin cancer 2014   basal- back    Past Surgical History:  Procedure Laterality Date  . ABDOMINAL HYSTERECTOMY  2007   with BSO  . APPENDECTOMY  2007  . AXILLARY LYMPH NODE DISSECTION Left 02/14/2016  . AXILLARY LYMPH NODE DISSECTION Left 02/14/2016   Procedure: LEFT AXILLARY LYMPH NODE DISSECTION;  Surgeon: [name], MD;  Location: [place];  Service: General;  Laterality: Left;  . BREAST RECONSTRUCTION WITH PLACEMENT OF TISSUE EXPANDER AND FLEX HD (ACELLULAR HYDRATED DERMIS) Left 02/02/2016   Procedure: IMMEDIATE LEFT BREAST RECONSTRUCTION WITH PLACEMENT OF TISSUE EXPANDER AND FLEX HD (ACELLULAR HYDRATED DERMIS);  Surgeon: [name], DO;  Location: [place];  Service: Plastics;  Laterality: Left;  . BREAST SURGERY Left 02/02/16   mastectomy with reconstruction  . c-section    .  COLON RESECTION  4/'2014  . HERNIA REPAIR    . Port- a- cath placement  07/2015  . PORT-A-CATH REMOVAL Right 02/02/2016   Procedure: REMOVAL PORT-A-CATH;  Surgeon: [name], MD;  Location: [place];  Service: General;  Laterality: Right;  . RADIOACTIVE SEED GUIDED AXILLARY SENTINEL LYMPH NODE Left 02/02/2016   Procedure: LEFT BREAST MASTECTOMY AND RADIOACTIVE SEED GUIDED LEFT SENTINEL LYMPH NODE BIOPSY ;  Surgeon: [name], MD;  Location: [place];  Service: General;  Laterality: Left;    There were no vitals filed for this visit.      Subjective Assessment - 03/20/16 1350    Subjective Pt states she feels better, and she has had medication change and so shee feels better.  She has been at the beach for a few weeks.  she had a fill today, and is going to see the radiologist this Friday    Pertinent History Breast cancer diagnosed November2016 She had chemotherapy. but did have some neuropathy so it had to be stopped. On June 8, she had a mastectomy with immedicate expander placement and with first set of lymphnodes removed, On  June 20  she went for complete axillary lymph node dissection. She says she has had always been "full necked" but has neck and upper body fullness since the cancer was diagnosed.    Patient Stated Goals increase my range of  motion so that I don't have so much pain    Currently in Pain? Yes   Pain Score 3    Pain Location Chest  lateral chest below axilla    Pain Orientation Left   Pain Radiating Towards lateral chest    Pain Onset More than a month ago            [facility] PT Assessment - 03/20/16 0001      AROM   Left Shoulder Flexion 158 Degrees   Left Shoulder ABduction 160 Degrees                     [facility] Adult PT Treatment/Exercise - 03/20/16 0001      Shoulder Exercises: Supine   ABduction AAROM;Left;5 reps  with dowel rod     Shoulder Exercises: Standing   Flexion AAROM;Both;5 reps  with dowel  rod    ABduction AAROM;Left;5 reps  with dowel rod      Manual Therapy   Manual Lymphatic Drainage (MLD) in sitting, short neck, diphragmatic breathing, left inguinal nodes with left axillo-inguinal anastamsis, shouler collectors . right axillary nodes and bilateral supraclavicular areas posteriorly, posterior interaxillary anastamosis, anterior throat, lateral neck, moving posteriorly, left anterior axillary area toward inguinal nodes and covered by compression bra.    Passive ROM to left shoulder in rotation, flexion and abduction with gentle stretch at the end of all ranges.                 PT Education - 03/20/16 1731    Education provided Yes   Education Details dowel rod abduction in standing   Person(s) Educated Patient   Methods Explanation;Demonstration   Comprehension Verbalized understanding;Returned demonstration                [facility] Goals - 03/20/16 1742      CC Long Term Goal  #1   Title Patient will report a decrease in pain by 50% so they can perform daily activities with greater ease   Status On-going     CC Long Term Goal  #2   Title Patient will improve shoulder flexion range of motion to 150 degrees to perform activities of daily living and household chores with greater ease   Baseline 158 on 03/20/2016   Status Achieved     CC Long Term Goal  #3   Title Patient with verbalize an understanding of lymphedema risk reduction precautions   Status On-going     CC Long Term Goal  #4   Title Patient will be independent in self manual lymph drainage     CC Long Term Goal  #5   Title Pt will report her edema symptoms in upper body are about 25% better    Status On-going            Plan - 03/20/16 1739    Clinical Impression Statement Pt has improved shoulder range of motion and has met that goal.  Attentive to first session of manual lymph drainage and will monitor herself for symptom changes later this evening.   Rehab Potential  Good   Clinical Impairments Affecting Rehab Potential chemotherapy with neuropathy    PT Next Visit Plan Assess for changes in edema symptoms, continue with manual lymph drianage in sitting and begin self treatment.  teach supine scapular strengthening exercise    Consulted and Agree with Plan of Care Patient      Patient will benefit from skilled therapeutic intervention in  order to improve the following deficits and impairments:  Decreased range of motion, Impaired UE functional use, Decreased activity tolerance, Decreased knowledge of precautions, Decreased skin integrity, Impaired perceived functional ability, Pain, Decreased knowledge of use of DME, Increased edema, Postural dysfunction, Decreased strength, Impaired sensation, Decreased scar mobility  Visit Diagnosis: Localized swelling, mass and lump, neck  Localized swelling, mass and lump, trunk  Stiffness of left shoulder joint  Pain in left shoulder     Problem List Patient Active Problem List   Diagnosis Date Noted  . Primary malignant neoplasm of left breast with stage 2 nodal metastasis per American Joint Committee on Cancer 7th edition (N2) ([place]) 02/14/2016  . Breast cancer ([place]) 02/02/2016  . 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 Insomnia 09/01/2015  . Breast cancer of upper-outer quadrant of left female breast ([place]) 08/08/2015   Donato Heinz. Owens Shark PT  [place] Levo 03/20/2016, [place], [place], [address] Phone: [phone]   Fax:  [phone]  Name: Alison [name] MRN: [id number] Date of Birth: 1960-10-21

## 2016-03-20 NOTE — Telephone Encounter (Signed)
No entry 

## 2016-03-21 ENCOUNTER — Ambulatory Visit: Payer: 59 | Admitting: Radiation Oncology

## 2016-03-21 ENCOUNTER — Ambulatory Visit: Payer: 59

## 2016-03-22 ENCOUNTER — Ambulatory Visit: Payer: 59 | Admitting: Physical Therapy

## 2016-03-22 DIAGNOSIS — R221 Localized swelling, mass and lump, neck: Secondary | ICD-10-CM | POA: Diagnosis not present

## 2016-03-22 DIAGNOSIS — M25612 Stiffness of left shoulder, not elsewhere classified: Secondary | ICD-10-CM

## 2016-03-22 NOTE — Patient Instructions (Signed)
[email or web address] Lymphatic flow series with Shoosh Lettick Crotzer 

## 2016-03-22 NOTE — Therapy (Signed)
[place], [place], [address] Phone: [phone]   Fax:  [phone]  Physical Therapy Treatment  Patient Details  Name: Alison [name] MRN: [id number] Date of Birth: 1961-07-28 Referring Provider: Dr.Byerly   Encounter Date: 03/22/2016      PT End of Session - 03/22/16 1724    Visit Number 3   Number of Visits 9   Date for PT Re-Evaluation 04/26/16   PT Start Time [id number]   PT Stop Time 1610   PT Time Calculation (min) 55 min   Activity Tolerance Patient tolerated treatment well   Behavior During Therapy [facility] for tasks assessed/performed      Past Medical History:  Diagnosis Date  . Asthma     seasonal asthma  . Breast cancer ([place])   . Complication of anesthesia 2007   aspiration pneumonia post hysterectomy.  Blood pressure would drop low- but no problems 01/2016- surgery  . History of blood transfusion   . Hypertension   . Neuropathy ([place])    with chemo  . Osteopenia determined by x-ray   . Pneumonia    1980's and 1990's  . Raynaud's disease   . Rectovaginal fistula    repaired 2014  . Skin cancer 2014   basal- back    Past Surgical History:  Procedure Laterality Date  . ABDOMINAL HYSTERECTOMY  2007   with BSO  . APPENDECTOMY  2007  . AXILLARY LYMPH NODE DISSECTION Left 02/14/2016  . AXILLARY LYMPH NODE DISSECTION Left 02/14/2016   Procedure: LEFT AXILLARY LYMPH NODE DISSECTION;  Surgeon: [name], MD;  Location: [place];  Service: General;  Laterality: Left;  . BREAST RECONSTRUCTION WITH PLACEMENT OF TISSUE EXPANDER AND FLEX HD (ACELLULAR HYDRATED DERMIS) Left 02/02/2016   Procedure: IMMEDIATE LEFT BREAST RECONSTRUCTION WITH PLACEMENT OF TISSUE EXPANDER AND FLEX HD (ACELLULAR HYDRATED DERMIS);  Surgeon: [name], DO;  Location: [place];  Service: Plastics;  Laterality: Left;  . BREAST SURGERY Left 02/02/16   mastectomy with reconstruction  . c-section    .  COLON RESECTION  4/'2014  . HERNIA REPAIR    . Port- a- cath placement  07/2015  . PORT-A-CATH REMOVAL Right 02/02/2016   Procedure: REMOVAL PORT-A-CATH;  Surgeon: [name], MD;  Location: [place];  Service: General;  Laterality: Right;  . RADIOACTIVE SEED GUIDED AXILLARY SENTINEL LYMPH NODE Left 02/02/2016   Procedure: LEFT BREAST MASTECTOMY AND RADIOACTIVE SEED GUIDED LEFT SENTINEL LYMPH NODE BIOPSY ;  Surgeon: [name], MD;  Location: [place];  Service: General;  Laterality: Left;    There were no vitals filed for this visit.      Subjective Assessment - 03/22/16 1522    Subjective Pt states she got some temporary relief from swelling at back of neck and anterior shoulder after last treatment and she did not have breakthrough pain    Pertinent History Breast cancer diagnosed November2016 She had chemotherapy. but did have some neuropathy so it had to be stopped. On June 8, she had a mastectomy with immedicate expander placement and with first set of lymphnodes removed, On  June 20  she went for complete axillary lymph node dissection. She says she has had always been "full necked" but has neck and upper body fullness since the cancer was diagnosed.    Patient Stated Goals increase my range of motion so that I don't have so much pain    Currently in Pain? Yes  Pain Score 4    Pain Location Shoulder   Pain Orientation Left   Pain Type Intractable pain   Pain Radiating Towards lateral chest                          [facility] Adult PT Treatment/Exercise - 03/22/16 0001      Self-Care   Other Self-Care Comments  issued klose training DVD for self manual lymph drainage.  showed son how to remove kinesiotape issued piece of gray foam to be placed at left lateral axilla above bra strap      Neck Exercises: Seated   Other Seated Exercise lymphatic flow yoga series      Manual Therapy   Manual Lymphatic Drainage (MLD) in sitting, short  neck, diphragmatic breathing, left inguinal nodes with left axillo-inguinal anastamsis, shouler collectors . right axillary nodes and bilateral supraclavicular areas posteriorly, posterior interaxillary anastamosis, anterior throat, lateral neck, moving posteriorly, left anterior axillary area toward inguinal nodes and covered by compression bra.    Kinesiotex Edema     Kinesiotix   Edema skinkote placed to skin on back then two pieced of kinseiotape applies to bach from shoulder to lower back toward left inguinal nodes                 PT Education - 03/22/16 1720    Education provided Yes   Education Details lymphatic flow yoga series, how to remove kinesiotape, self manual lymph drainage    Person(s) Educated Patient;Child(ren)   Methods Explanation;Demonstration   Comprehension Need further instruction;Verbalized understanding;Returned demonstration                [facility] Goals - 03/20/16 1742      CC Long Term Goal  #1   Title Patient will report a decrease in pain by 50% so they can perform daily activities with greater ease   Status On-going     CC Long Term Goal  #2   Title Patient will improve shoulder flexion range of motion to 150 degrees to perform activities of daily living and household chores with greater ease   Baseline 158 on 03/20/2016   Status Achieved     CC Long Term Goal  #3   Title Patient with verbalize an understanding of lymphedema risk reduction precautions   Status On-going     CC Long Term Goal  #4   Title Patient will be independent in self manual lymph drainage     CC Long Term Goal  #5   Title Pt will report her edema symptoms in upper body are about 25% better    Status On-going            Plan - 03/22/16 1725    Clinical Impression Statement Pt reports relief from pain in left upper quadrant with decreased swelling that lasted ~ 24 hours after last session.  Obeserved increased swelling in left middle and lower  back today after treatment.  Upgraded to include kinesiotape to this area to facilaite draining to left inguinal nodes.  Used 2 mirror for pt to get visual appreciation of back edema.  Hand over hand instuction to son to help with posterior manual lymph driainage and removal of kinesiotape Pt will watch DVD to do self manual lymph drianage this weekend.    Rehab Potential Good   Clinical Impairments Affecting Rehab Potential chemotherapy with neuropathy    PT Treatment/Interventions ADLs/Self Care Home Management;Manual lymph drainage;Compression bandaging;Scar  mobilization;Passive range of motion;Patient/family education;Taping;Manual techniques;Therapeutic exercise;Therapeutic activities;Orthotic Fit/Training;DME Instruction   PT Next Visit Plan Assess for changes in edema symptoms, continue with manual lymph drianage in sitting and begin self treatment. continue with kinesiotape if helpful teach supine scapular strengthening exercise if time    Consulted and Agree with Plan of Care Patient      Patient will benefit from skilled therapeutic intervention in order to improve the following deficits and impairments:  Decreased range of motion, Impaired UE functional use, Decreased activity tolerance, Decreased knowledge of precautions, Decreased skin integrity, Impaired perceived functional ability, Pain, Decreased knowledge of use of DME, Increased edema, Postural dysfunction, Decreased strength, Impaired sensation, Decreased scar mobility  Visit Diagnosis: Localized swelling, mass and lump, neck  Stiffness of left shoulder joint     Problem List Patient Active Problem List   Diagnosis Date Noted  . Primary malignant neoplasm of left breast with stage 2 nodal metastasis per American Joint Committee on Cancer 7th edition (N2) ([place]) 02/14/2016  . Breast cancer ([place]) 02/02/2016  . 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 Insomnia 09/01/2015  . Breast cancer of upper-outer quadrant of  left female breast ([place]) 08/08/2015   Donato Heinz. Owens Shark PT  [place] Levo 03/22/2016, 5:30 PM  [place], [place], [address] Phone: [phone]   Fax:  [phone]  Name: ROSHINI BRUEGGER MRN: [id number] Date of Birth: December 11, 1960

## 2016-03-23 ENCOUNTER — Ambulatory Visit
Admission: RE | Admit: 2016-03-23 | Discharge: 2016-03-23 | Disposition: A | Discharge status: Home / Self Care | Payer: 59 | Source: Ambulatory Visit | Attending: Radiation Oncology | Admitting: Radiation Oncology

## 2016-03-23 ENCOUNTER — Encounter: Payer: Self-pay | Admitting: Radiation Oncology

## 2016-03-23 DIAGNOSIS — C50412 Malignant neoplasm of upper-outer quadrant of left female breast: Secondary | ICD-10-CM | POA: Insufficient documentation

## 2016-03-23 DIAGNOSIS — Z882 Allergy status to sulfonamides status: Secondary | ICD-10-CM | POA: Insufficient documentation

## 2016-03-23 DIAGNOSIS — R221 Localized swelling, mass and lump, neck: Secondary | ICD-10-CM | POA: Diagnosis not present

## 2016-03-23 DIAGNOSIS — Z170 Estrogen receptor positive status [ER+]: Secondary | ICD-10-CM | POA: Insufficient documentation

## 2016-03-23 DIAGNOSIS — I890 Lymphedema, not elsewhere classified: Secondary | ICD-10-CM | POA: Diagnosis not present

## 2016-03-23 DIAGNOSIS — Z79899 Other long term (current) drug therapy: Secondary | ICD-10-CM | POA: Diagnosis not present

## 2016-03-23 DIAGNOSIS — Z510 Encounter for antineoplastic radiation therapy: Secondary | ICD-10-CM | POA: Insufficient documentation

## 2016-03-23 NOTE — Progress Notes (Signed)
Radiation Oncology         [phone] ________________________________  Name: Alison [name] MRN: [id number]  Date: 03/23/2016  DOB: [birth year]  Follow-Up Visit Note  Outpatient  CC: [name], MD  Erroll Luna, MD  Diagnosis and Prior Radiotherapy:    ICD-9-CM ICD-10-CM   1. 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 The patient's initial consultation with radiation oncology was with Dr. Pablo Ledger on 08/10/2015 at [facility].  Alison [name] had routine screening mammography with tomography at the [facility] on 10/19/14 showing a possible mass in the right breast. Diagnostic right mammography with right breast ultrasonography on 11/01/2014 found the breast density to be category C. In the upper outer quadrant of the right breast there was a partially obscured mass which on physical exam was palpable as an area of thickening at the 10 o'clock position 8 cm from the nipple. Ultrasound showed multiple cysts in the upper outer right breast corresponding to the mass in question measuring 1.6 cm in max diameter.  In November 2016 the patient felt a change in the upper left breast and brought it to her PCP's attention. Diagnostic left mammography with tomosynthesis and left breast ultrasonography at the [facility] on 07/31/16 found trabecular thickening throughout the left breast with skin thickening, but no circumscribed mass or suspicious calcifications. A rounded mass in the left axilla measured 6 mm. There was an additional mildly prominent lymph node in the left axilla which was what the patient had been palpating. Ultrasonography showed ill-defined swelling throughout the inner left breast with overlying skin thickening.. At the 2:00 position 4 cm from the nipple there  was an irregular hypoechoic mass measuring 2.3 cm. A second mass at the 1:00 area 3 cm from the nipple measured 1 cm. The distance between these 2 masses was 1.8 cm. There was also a round hypoechoic left axillary mass measuring 0.6 cm. Overall the was an area of 3.7 cm of mixed echogenicity corresponding to the area of palpable soft tissue swelling.   On 08/04/2015 the patient underwent biopsy of the mass at the 2:00 axis 4 cm from the nipple and a second biopsy was performed of the hypoechoic mass at the 1:00 position 3 cm from the nipple. Biopsy of the suspicious nodule in the lower left axilla was attempted but could not be completed as the mass became obscured after administration of lidocaine.  The pathology from both left breast biopsies on 08/04/2015 showed invasive ductal carcinoma, grade 3, ER 95% positive, PR 30% positive, HER2 negative, Ki67 12%).  On 08/09/2015 the patient underwent biopsy of this suspicious left axillary lymph node noted above, and this showed metastatic carcinoma with extracapsular extension.  The patient started neoadjuvant chemotherapy on 08/25/2015 consisting of doxorubicin and cyclophosphamide in dose dense fashion 4, completed 10/06/2015,  followed by weekly paclitaxel 10 completed 12/23/2015. Her final 2 planned cycles of weekly paclitaxel were omitted because of neuropathy concerns  She underwent a left mastectomy with targeted axillary dissection 02/02/2016. The pathology from this procedure showed residual invasive ductal carcinoma, grade 2, measuring 10 cm. Margins were negative. The single sentinel lymph node was positive. 3 additional lymph nodes had micrometastatic tumor deposits in them. The repeat prognostic panel showed this tumor to be estrogen receptor 60% positive with  weak staining intensity, progesterone receptor negative, and HER-2 nonamplified the signals ratio of 1.0 and number per cell 2.0. The patient's port was removed during this procedure,  and she had an expander placed to eventually undergo an implant reconstruction.  On 02/14/2016 the patient underwent at complete left axillary lymph node dissection. The final results ([id number]) showed an additional 2 out of an additional 6 lymph nodes involved, so that the final lymph node count was 6 positive out of 10 sampled left axillary lymph nodes.  Her case was re-presented at the breast cancer multidisciplinary conference. At that time it was felt the patient would benefit from Capecitabine (Xeloda) adjuvant chemotherapy concurrently with radiation and this would be followed by anti-estrogen treatment. The patient presents today to discuss the role of radiation for the management of her disease.  On review of systems: The patient is off the hormone replacement therapy she was previously on. She has lymphedema of her left arm and states it has improved; and she has significant edema of the neck and back. She is working with physical therapy twice a week. She has left breast pain that she rates as a 2/10. She is taking Oxycotin 40 mg BID and is using very little breatkthrough Oxycodone since starting the Oxycontin. The patient states she has started taking Xeloda, but is confused with the timing and dosage.  ALLERGIES:  is allergic to sulfa antibiotics and zofran [ondansetron hcl].  Meds: Current Outpatient Prescriptions  Medication Sig Dispense Refill  . capecitabine (XELODA) 500 MG tablet Take 2 tablets (1,000 mg total) by mouth 2 (two) times daily after a meal. 120 tablet 1  . cetirizine (ZYRTEC) 10 MG tablet Take 10 mg by mouth daily.    . Cholecalciferol (VITAMIN D3) 2000 UNITS capsule Take 2,000 Units by mouth daily.     . eszopiclone (LUNESTA) 2 MG TABS tablet Take 1 tablet (2 mg total) by mouth at bedtime. Take immediately before bedtime 30 tablet 0  . Fenofibric Acid 105 MG TABS Take 1 tablet by mouth daily.    . fluticasone (FLONASE) 50 MCG/ACT nasal spray Place 2 sprays into  both nostrils daily. Reported on 11/18/2015    . gabapentin (NEURONTIN) 300 MG capsule Take 1 capsule (300 mg total) by mouth 3 (three) times daily. Patient takes 100 mg 3 times a day 270 capsule 1  . ibuprofen (ADVIL,MOTRIN) 200 MG tablet Take 400 mg by mouth every 6 (six) hours as needed for headache, mild pain or moderate pain. Reported on 01/06/2016    . loratadine (CLARITIN) 10 MG tablet Take 10 mg by mouth daily.    Marland Kitchen LORazepam (ATIVAN) 0.5 MG tablet Take 0.5 mg by mouth at bedtime.    . methocarbamol (ROBAXIN) 500 MG tablet Take 1 tablet (500 mg total) by mouth every 6 (six) hours as needed for muscle spasms. 40 tablet 1  . Multiple Vitamin (MULTIVITAMIN) tablet Take 1 tablet by mouth daily.    Marland Kitchen NIFEdipine (PROCARDIA-XL/ADALAT-CC/NIFEDICAL-XL) 30 MG 24 hr tablet Take 30 mg by mouth daily.    Marland Kitchen olmesartan (BENICAR) 20 MG tablet Take 1 tablet (20 mg total) by mouth daily. 90 tablet 4  . omega-3 acid ethyl esters (LOVAZA) 1 g capsule Take 1 g by mouth 2 (two) times daily.    Marland Kitchen oxyCODONE (OXY IR/ROXICODONE) 5 MG immediate release tablet Take 1-2 tablets (5-10 mg total) by mouth every 4 (four) hours as needed for moderate pain, severe pain or breakthrough pain. 120 tablet 0  .  oxyCODONE (OXYCONTIN) 20 mg 12 hr tablet Take 2 tablets (40 mg total) by mouth every 12 (twelve) hours. 120 tablet 0  . polyethylene glycol (MIRALAX / GLYCOLAX) packet Take 17 g by mouth daily as needed for mild constipation. Reported on 01/06/2016    . Probiotic Product (PROBIOTIC DAILY PO) Take 1 capsule by mouth daily. [place] prochlorperazine (COMPAZINE) 10 MG tablet Take 1 tablet (10 mg total) by mouth every 6 (six) hours as needed (Nausea or vomiting). 30 tablet 1  . traZODone (DESYREL) 150 MG tablet Take 150 mg by mouth at bedtime.    Marland Kitchen HYDROcodone-acetaminophen (NORCO/VICODIN) 5-325 MG tablet Take 1 tablet by mouth every 4 (four) hours as needed for moderate pain.     No  current facility-administered medications for this encounter.     Physical Findings: The patient is in no acute distress. Patient is alert and oriented.  height is 5' 3"  (1.6 m) and weight is 155 lb 11.2 oz (70.6 kg). Her temperature is 97.9 F (36.6 C). Her blood pressure is 108/55 (abnormal) and her pulse is 89. .    General: Alert and oriented, in no acute distress HEENT: Head is normocephalic. Extraocular movements are intact. Oropharynx is clear. Neck: Neck is supple, no palpable cervical or supraclavicular lymphadenopathy. Extensive lymphedema throughout the entire neck. Heart: Regular in rate and rhythm with no murmurs, rubs, or gallops. Chest: Clear to auscultation bilaterally, with no rhonchi, wheezes, or rales. Status post mastectomy and expander placement on the left side with residual scab over the medial chest wall scar. No drainage. No palpable masses in the axillary region, right breast, or left chest wall appreciated. Abdomen: Soft, nontender, nondistended, with no rigidity or guarding. Lymphatics: see Neck Exam and Chest Exam. Skin: No concerning lesions. Musculoskeletal: symmetric strength and muscle tone throughout. Neurologic: Cranial nerves II through XII are grossly intact. No obvious focalities. Speech is fluent. Coordination is intact. Psychiatric: Judgment and insight are intact. Affect is appropriate.  Lab Findings: Lab Results  Component Value Date   WBC 4.1 03/02/2016   HGB 10.8 (L) 03/02/2016   HCT 32.8 (L) 03/02/2016   MCV 92.5 03/02/2016   PLT 306 03/02/2016    Radiographic Findings: No results found.  Impression/Plan: I spoke to the patient today regarding her diagnosis and options for treatment. We discussed the equivalence in terms of survival and local failure between mastectomy and breast conservation. We discussed the role of radiation in decreasing local failures in patients who undergo mastectomy and have risk factors for recurrence including  positive lymph nodes and/or tumors over 5 cm. We discussed the process of simulation and the placement tattoos. We discussed 6-7 weeks of treatment as an outpatient. We discussed the possibility of heart or lung damage.  We discussed the possible side effects including but not limited to skin redness,peeling of skin, fatigue, permanent skin darkening, and chest wall swelling. We discussed increased complications that can occur with reconstruction after radiation. CT simulation has been scheduled after this encounter. The patient signed a consent form and this was placed in her medical chart.  In addition to completing her local treatment with radiation, she will receive Capecitabine (Xeloda). This is going to be done in 2 parts. During radiation she will receive 1000 mg (two 500 mg tablets) twice daily on radiation days, and that will service chemosensitization. When she completes the radiation, she will start full dose capecitabine at 1500 mg (three 500  mg tablets) twice daily 1 week on and one-week off and she will continue that for an additional 4 months. She has questions concerning her Xeloda dosage. I encouraged her to call Dr. Virgie Dad office for clarification.  Continue PT for lymphedema.  I am trying to reach Dr. Marla Roe to discuss the patient's coordination of care. We are simulating her today since she is over 1 month out from surgery for high risk disease. Her expander has been incompletely filled and I would like to talk to Dr. Marla Roe about minimizing drastic changes in her anatomy after simulation. (note - I did talk with her today after consult.  Dr. Keturah Barre will not expand the device further).  I spent >45 minutes face to face with the patient and her husband and more than half of that time was spent in counseling and/or coordination of care. _____________________________________   Eppie Gibson, MD  This document serves as a record of services personally performed by Eppie Gibson,  MD. It was created on her behalf by Darcus Austin, a trained medical scribe. The creation of this record is based on the scribe's personal observations and the provider's statements to them. This document has been checked and approved by the attending provider.

## 2016-03-23 NOTE — Progress Notes (Signed)
Radiation Oncology         [phone] ________________________________  Name: Alison [name] MRN: [id number]  Date: 03/23/2016  DOB: 1961/06/24  SIMULATION AND TREATMENT PLANNING NOTE  / Special treatment procedure  Outpatient  DIAGNOSIS:     ICD-9-CM ICD-10-CM   1. Breast cancer of upper-outer quadrant of left female breast ([place]) 174.4 C50.412     NARRATIVE:  The patient was brought to the [facility].  Identity was confirmed.  All relevant records and images related to the planned course of therapy were reviewed.  The patient freely provided informed written consent to proceed with treatment after reviewing the details related to the planned course of therapy. The consent form was witnessed and verified by the simulation staff.    Then, the patient was set-up in a stable reproducible supine position for radiation therapy with her ipsilateral arm over her head, and her upper body secured in a custom-made Vac-lok device.  CT images were obtained.  Surface markings were placed.  The CT images were loaded into the planning software.    TREATMENT PLANNING NOTE: Treatment planning then occurred.  The radiation prescription was entered and confirmed.     A total of 5 medically necessary complex treatment devices were fabricated and supervised by me: 4 fields with MLCs for custom blocks to protect heart, and lungs;  and, a Vac-lok. MORE COMPLEX DEVICES MAY BE MADE IN DOSIMETRY FOR FIELD IN FIELD BEAMS FOR DOSE HOMOGENEITY.  I have requested : 3D Simulation  I have requested a DVH of the following structures: lungs, heart, esophagus, cord, targets.    The patient will receive 50.4 Gy in 28 fractions to the left chest wall with 2 tangential fields.  The internal mammary nodes in the left side will receive >45Gy in these fields as well.   The left axilla and SCV nodes will receive 50.4 Gy in 28 fractions with two opposed fields . This will be followed by a scar boost.  Special  treatment procedure:  Special treatment procedure was performed today due to the extra time and effort required by myself to plan and prepare this patient for deep inspiration breath hold technique.  I have determined cardiac sparing to be of benefit to this patient to prevent long term cardiac damage due to radiation of the heart.  Bellows were placed on the patient's abdomen. To facilitate cardiac sparing, the patient was coached by the radiation therapists on breath hold techniques and breathing practice was performed. Practice waveforms were obtained. The patient was then scanned while maintaining breath hold in the treatment position.  This image was then transferred over to the imaging specialist. The imaging specialist then created a fusion of the free breathing and breath hold scans using the chest wall as the stable structure. I personally reviewed the fusion in axial, coronal and sagittal image planes.  Excellent cardiac sparing was obtained.  I felt the patient is an appropriate candidate for breath hold and the patient will be treated as such.  The image fusion was then reviewed with the patient to reinforce the necessity of reproducible breath hold.   Optical Surface Tracking Plan:  Since intensity modulated radiotherapy (IMRT) and 3D conformal radiation treatment methods are predicated on accurate and precise positioning for treatment, intrafraction motion monitoring is medically necessary to ensure accurate and safe treatment delivery. The ability to quantify intrafraction motion without excessive ionizing radiation dose can only be performed with optical surface tracking. Accordingly, surface imaging offers  the opportunity to obtain 3D measurements of patient position throughout IMRT and 3D treatments without excessive radiation exposure. I am ordering optical surface tracking for this patient's upcoming course of radiotherapy.  ________________________________   Reference:  Ursula Alert, J, et al. Surface imaging-based analysis of intrafraction motion for breast radiotherapy patients.Journal of [place], n. 6, nov. 2014. ISSN [id number].  Available at: <http://www.jacmp.org/index.php/jacmp/article/view/4957>.    -----------------------------------  Eppie Gibson, MD

## 2016-03-27 ENCOUNTER — Telehealth: Payer: Self-pay | Admitting: Pharmacist

## 2016-03-27 ENCOUNTER — Ambulatory Visit: Payer: 59 | Attending: General Surgery | Admitting: Physical Therapy

## 2016-03-27 DIAGNOSIS — R222 Localized swelling, mass and lump, trunk: Secondary | ICD-10-CM | POA: Insufficient documentation

## 2016-03-27 DIAGNOSIS — R221 Localized swelling, mass and lump, neck: Secondary | ICD-10-CM | POA: Diagnosis present

## 2016-03-27 DIAGNOSIS — M25612 Stiffness of left shoulder, not elsewhere classified: Secondary | ICD-10-CM | POA: Diagnosis present

## 2016-03-27 DIAGNOSIS — M25512 Pain in left shoulder: Secondary | ICD-10-CM | POA: Insufficient documentation

## 2016-03-27 NOTE — Therapy (Signed)
[place], [place], [address] Phone: [phone]   Fax:  [phone]  Physical Therapy Treatment  Patient Details  Name: Alison [name] MRN: [id number] Date of Birth: June 06, 1961 Referring Provider: Dr.Byerly   Encounter Date: 03/27/2016      PT End of Session - 03/27/16 1747    Visit Number 4   Number of Visits 9   Date for PT Re-Evaluation 04/26/16   PT Start Time [id number]   PT Stop Time 1609   PT Time Calculation (min) 52 min   Activity Tolerance Patient tolerated treatment well   Behavior During Therapy [facility] for tasks assessed/performed      Past Medical History:  Diagnosis Date  . Asthma     seasonal asthma  . Breast cancer ([place])   . Complication of anesthesia 2007   aspiration pneumonia post hysterectomy.  Blood pressure would drop low- but no problems 01/2016- surgery  . History of blood transfusion   . Hypertension   . Neuropathy ([place])    with chemo  . Osteopenia determined by x-ray   . Pneumonia    1980's and 1990's  . Raynaud's disease   . Rectovaginal fistula    repaired 2014  . Skin cancer 2014   basal- back    Past Surgical History:  Procedure Laterality Date  . ABDOMINAL HYSTERECTOMY  2007   with BSO  . APPENDECTOMY  2007  . AXILLARY LYMPH NODE DISSECTION Left 02/14/2016  . AXILLARY LYMPH NODE DISSECTION Left 02/14/2016   Procedure: LEFT AXILLARY LYMPH NODE DISSECTION;  Surgeon: [name], MD;  Location: [facility];  Service: General;  Laterality: Left;  . BREAST RECONSTRUCTION WITH PLACEMENT OF TISSUE EXPANDER AND FLEX HD (ACELLULAR HYDRATED DERMIS) Left 02/02/2016   Procedure: IMMEDIATE LEFT BREAST RECONSTRUCTION WITH PLACEMENT OF TISSUE EXPANDER AND FLEX HD (ACELLULAR HYDRATED DERMIS);  Surgeon: [name], DO;  Location: [facility];  Service: Plastics;  Laterality: Left;  . BREAST SURGERY Left 02/02/16   mastectomy with reconstruction  . c-section    .  COLON RESECTION  4/'2014  . HERNIA REPAIR    . Port- a- cath placement  07/2015  . PORT-A-CATH REMOVAL Right 02/02/2016   Procedure: REMOVAL PORT-A-CATH;  Surgeon: [name], MD;  Location: [facility];  Service: General;  Laterality: Right;  . RADIOACTIVE SEED GUIDED AXILLARY SENTINEL LYMPH NODE Left 02/02/2016   Procedure: LEFT BREAST MASTECTOMY AND RADIOACTIVE SEED GUIDED LEFT SENTINEL LYMPH NODE BIOPSY ;  Surgeon: [name], MD;  Location: [facility];  Service: General;  Laterality: Left;    There were no vitals filed for this visit.      Subjective Assessment - 03/27/16 1522    Subjective Had XRT simulation on Friday and developed significant left arm pain from that.  She was told she will need to maintain arm position for 30 minutes for treatments, and she is concerned about tolerating this.  Will have concurrent chemo to potentiate XRT effects.  "I don't know that the kinesiotape made a difference."  --took it off Sunday when it was itching.  Looked at the lymphedema DVD and has continued to stimulate at neck but hasn't wanted to move fluid to the right because she still has cancer.  Foam pad at left flank had been comfortable prior to XRT simulation, but not afterwards.   Currently in Pain? Yes   Pain Score 5    Pain Location Axilla  and upper  flank   Pain Orientation Left   Pain Descriptors / Indicators Sharp;Burning;Heaviness   Aggravating Factors  being in position for XRT simulation   Pain Relieving Factors medication   Effect of Pain on Daily Activities couldn't sleep last night, it just kept waking her up            [facility] PT Assessment - 03/27/16 0001      AROM   Left Shoulder Flexion 145 Degrees   Left Shoulder ABduction 146 Degrees                     [facility] Adult PT Treatment/Exercise - 03/27/16 0001      Neck Exercises: Seated   Other Seated Exercise dowel exercises and wall walking for left shoulder abduction and  flexion, hold 10 counts and do 3-5 times each   Other Seated Exercise shoulder rolls backward     Manual Therapy   Manual Lymphatic Drainage (MLD) In sitting, diaphragmatic breathing, supraclavicular fossae, pectoral nodes, left inguinal nodes and right axillary nodes, posterolateral neck; lateral, anterolateral, and anterior neck.                PT Education - 03/27/16 1747    Education provided Yes   Education Details dowel exercises and wall walking for left shoulder flexion and abduction   Person(s) Educated Patient   Methods Explanation;Demonstration;Verbal cues;Handout   Comprehension Verbalized understanding;Returned demonstration                [facility] Goals - 03/20/16 1742      CC Long Term Goal  #1   Title Patient will report a decrease in pain by 50% so they can perform daily activities with greater ease   Status On-going     CC Long Term Goal  #2   Title Patient will improve shoulder flexion range of motion to 150 degrees to perform activities of daily living and household chores with greater ease   Baseline 158 on 03/20/2016   Status Achieved     CC Long Term Goal  #3   Title Patient with verbalize an understanding of lymphedema risk reduction precautions   Status On-going     CC Long Term Goal  #4   Title Patient will be independent in self manual lymph drainage     CC Long Term Goal  #5   Title Pt will report her edema symptoms in upper body are about 25% better    Status On-going            Plan - 03/27/16 1748    Clinical Impression Statement Patient came in today with considerable anxiety from increased left axillary and upper flank pain following simulation for her upcoming XRT.  We were able to reduce this to some extent with stretching exercises and manual lylmph drainage.  Her left neck swelling remains significant.  I did not do manual lymph drainage of interaxillary anastomoses today, as patient reported concern about this  because of her understanding that she still has active cancer.  She did agree to ask Dr. [name] for clarification about that.   Rehab Potential Good   PT Frequency 2x / week   PT Duration 4 weeks   PT Treatment/Interventions ADLs/Self Care Home Management;Manual lymph drainage;Compression bandaging;Scar mobilization;Passive range of motion;Patient/family education;Taping;Manual techniques;Therapeutic exercise;Therapeutic activities;Orthotic Fit/Training;DME Instruction   PT Next Visit Plan Assess for changes in pain and edema symptoms, continue with manual lymph drainage in sitting and begin self treatment.  Teach supine scapular strengthening exercise if time    PT Home Exercise Plan dowel and/or wall walking for left shoulder ROM   Consulted and Agree with Plan of Care Patient      Patient will benefit from skilled therapeutic intervention in order to improve the following deficits and impairments:  Decreased range of motion, Impaired UE functional use, Decreased activity tolerance, Decreased knowledge of precautions, Decreased skin integrity, Impaired perceived functional ability, Pain, Decreased knowledge of use of DME, Increased edema, Postural dysfunction, Decreased strength, Impaired sensation, Decreased scar mobility  Visit Diagnosis: Localized swelling, mass and lump, neck  Stiffness of left shoulder joint     Problem List Patient Active Problem List   Diagnosis Date Noted  . Primary malignant neoplasm of left breast with stage 2 nodal metastasis per American Joint Committee on Cancer 7th edition (N2) ([place]) 02/14/2016  . Breast cancer ([place]) 02/02/2016  . 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 Insomnia 09/01/2015  . Breast cancer of upper-outer quadrant of left female breast ([place]) 08/08/2015    SALISBURY,DONNA 03/27/2016, 5:53 PM  [place], [place], [address] Phone: [phone]    Fax:  [phone]  Name: Alison [name] MRN: [id number] Date of Birth: April 18, 1961   Serafina Royals, PT 03/27/16 5:53 PM

## 2016-03-27 NOTE — Patient Instructions (Addendum)
Inferior Capsule Stick Stretch I    Stand or sit, dowel in palm of arm to be stretched. Other arm, holding dowel in front of body, pushes outward and upward until arm being stretched is as high to the side as possible. Hold _10__ seconds. Repeat _3-5__ times per session. Do _3__ sessions per day.  Copyright  VHI. All rights reserved.  Flexors Stick Stretch I    Stand or sit, dowel in palm of arm to be stretched. Other arm, holding dowel at side and behind body, pushes arm being stretched forward and upward until straight over head. Hold _10__ seconds. Repeat __3-5_ times per session. Do __3_ sessions per day.  Copyright  VHI. All rights reserved.    ALTERNATIVELY,   1)  Stand near a wall with your left shoulder toward the wall.  Place your left fingers on the wall and walk your hand up to get your arm as high up as you can, feeling a GENTLE stretch.  Hold 10 counts, do 3-5 repetitions, and do 3 times a day.  2)  Stand near a wall facing the wall.  Place left fingers on the wall and walk them up (going forward from your body) until you feel a gentle stretch, stepping in closer to the wall to reach if you need to.  Hold 10 counts and do 3-5 repetitions, 3 times a day.

## 2016-03-27 NOTE — Telephone Encounter (Signed)
Attempted to reach patient to provide medication education for the patients new oral medication: Xeloda. No answer. Left VM for patient to call back with any questions.  Patient is to start medication 8/7 coordinating with the start of her radiation therapy.  Thank you,  Nuala Alpha, PharmD [facility] [phone]

## 2016-03-28 ENCOUNTER — Telehealth: Payer: Self-pay | Admitting: Pharmacist

## 2016-03-28 DIAGNOSIS — Z510 Encounter for antineoplastic radiation therapy: Secondary | ICD-10-CM | POA: Diagnosis not present

## 2016-03-28 NOTE — Telephone Encounter (Signed)
Oral Chemotherapy Pharmacist Encounter  I spoke with patient for overview of new oral chemotherapy medication: Xeloda. The patient will start her medication 04/02/16 along with her radiation. She reports that she has picked up her medication from [place] and has the medication at home with her.   Counseled patient on administration, dosing, side effects, safe handling, and monitoring. Side effects include but not limited to: fatigue, muscle pain, hand and foot syndrome. She reports that she read the patient education information that came with her medication and had some questions about the hand and foot syndrome, s/sx were reviewed with the patient and her questions were answered. She voiced understanding and appreciation.   Will follow up in 2 weeks for adherence and toxicity management.   Thank you,  Nuala Alpha, PharmD [facility] [phone]

## 2016-03-29 ENCOUNTER — Ambulatory Visit: Payer: 59 | Admitting: Physical Therapy

## 2016-03-29 DIAGNOSIS — R221 Localized swelling, mass and lump, neck: Secondary | ICD-10-CM | POA: Diagnosis not present

## 2016-03-29 DIAGNOSIS — R222 Localized swelling, mass and lump, trunk: Secondary | ICD-10-CM

## 2016-03-29 DIAGNOSIS — M25512 Pain in left shoulder: Secondary | ICD-10-CM

## 2016-03-29 DIAGNOSIS — M25612 Stiffness of left shoulder, not elsewhere classified: Secondary | ICD-10-CM

## 2016-03-29 NOTE — Therapy (Signed)
[place], [place], [address] Phone: [phone]   Fax:  [phone]  Physical Therapy Treatment  Patient Details  Name: Alison [name] MRN: [id number] Date of Birth: Aug 22, 1961 Referring Provider: Dr.Byerly   Encounter Date: 03/29/2016      PT End of Session - 03/29/16 1706    Visit Number 5   Number of Visits 9   Date for PT Re-Evaluation 04/26/16   PT Start Time [id number]   PT Stop Time 1605   PT Time Calculation (min) 50 min   Activity Tolerance Patient tolerated treatment well   Behavior During Therapy [facility] for tasks assessed/performed      Past Medical History:  Diagnosis Date  . Asthma     seasonal asthma  . Breast cancer ([place])   . Complication of anesthesia 2007   aspiration pneumonia post hysterectomy.  Blood pressure would drop low- but no problems 01/2016- surgery  . History of blood transfusion   . Hypertension   . Neuropathy ([place])    with chemo  . Osteopenia determined by x-ray   . Pneumonia    1980's and 1990's  . Raynaud's disease   . Rectovaginal fistula    repaired 2014  . Skin cancer 2014   basal- back    Past Surgical History:  Procedure Laterality Date  . ABDOMINAL HYSTERECTOMY  2007   with BSO  . APPENDECTOMY  2007  . AXILLARY LYMPH NODE DISSECTION Left 02/14/2016  . AXILLARY LYMPH NODE DISSECTION Left 02/14/2016   Procedure: LEFT AXILLARY LYMPH NODE DISSECTION;  Surgeon: [name], MD;  Location: [place];  Service: General;  Laterality: Left;  . BREAST RECONSTRUCTION WITH PLACEMENT OF TISSUE EXPANDER AND FLEX HD (ACELLULAR HYDRATED DERMIS) Left 02/02/2016   Procedure: IMMEDIATE LEFT BREAST RECONSTRUCTION WITH PLACEMENT OF TISSUE EXPANDER AND FLEX HD (ACELLULAR HYDRATED DERMIS);  Surgeon: [name], DO;  Location: [place];  Service: Plastics;  Laterality: Left;  . BREAST SURGERY Left 02/02/16   mastectomy with reconstruction  . c-section    .  COLON RESECTION  4/'2014  . HERNIA REPAIR    . Port- a- cath placement  07/2015  . PORT-A-CATH REMOVAL Right 02/02/2016   Procedure: REMOVAL PORT-A-CATH;  Surgeon: [name], MD;  Location: [place];  Service: General;  Laterality: Right;  . RADIOACTIVE SEED GUIDED AXILLARY SENTINEL LYMPH NODE Left 02/02/2016   Procedure: LEFT BREAST MASTECTOMY AND RADIOACTIVE SEED GUIDED LEFT SENTINEL LYMPH NODE BIOPSY ;  Surgeon: [name], MD;  Location: [place];  Service: General;  Laterality: Left;    There were no vitals filed for this visit.      Subjective Assessment - 03/29/16 1528    Subjective (P)  Pt is still anxious about the radiation  she says she is not hurting like she was the other    Pertinent History (P)  Breast cancer diagnosed November2016 She had chemotherapy. but did have some neuropathy so it had to be stopped. On June 8, she had a mastectomy with immedicate expander placement and with first set of lymphnodes removed, On  June 20  she went for complete axillary lymph node dissection. She says she has had always been "full necked" but has neck and upper body fullness since the cancer was diagnosed.    Patient Stated Goals (P)  increase my range of motion so that I don't have so much pain    Currently in Pain? (P)  No/denies  taking one oxycodone                          [facility] Adult PT Treatment/Exercise - 03/29/16 0001      Shoulder Exercises: Sidelying   External Rotation AROM;Left;20 reps   ABduction AROM;Left;10 reps  painful    Other Sidelying Exercises small circles with hand pointed to ceiling      Shoulder Exercises: Pulleys   Flexion 1 minute     Shoulder Exercises: ROM/Strengthening   Wall Wash in flexion and abduciton with stretching      Manual Therapy   Manual Lymphatic Drainage (MLD) In supine , diaphragmatic breathing,  pectoral nodes, left and right inguinal nodes and , posterolateral neck; right sidelying  for  posterior cervical upper and lower back, then to sitting for more upper  shoulder and supraclabvicular work directing fluid posteriorly    Passive ROM to left shoulder in rotation, flexion and abduction with gentle stretch at the end of all ranges.                         [facility] Goals - 03/20/16 1742      CC Long Term Goal  #1   Title Patient will report a decrease in pain by 50% so they can perform daily activities with greater ease   Status On-going     CC Long Term Goal  #2   Title Patient will improve shoulder flexion range of motion to 150 degrees to perform activities of daily living and household chores with greater ease   Baseline 158 on 03/20/2016   Status Achieved     CC Long Term Goal  #3   Title Patient with verbalize an understanding of lymphedema risk reduction precautions   Status On-going     CC Long Term Goal  #4   Title Patient will be independent in self manual lymph drainage     CC Long Term Goal  #5   Title Pt will report her edema symptoms in upper body are about 25% better    Status On-going            Plan - 03/29/16 1707    Clinical Impression Statement pt still anxious about radiation.  she will have bone scan and CT scan on Monday and see dr. [name] on Tuesday before her treatment and plans to talk to him about her trunkal edem with extends from neck down toward inguinal area on anterior and postrior body.  If all OK will proceed with edema managment to possibley include a compression vest also will consider trial of anterio throat chip pack as well as kinsiotape in addition to manual lymph drainage and exercise    Rehab Potential Good   Clinical Impairments Affecting Rehab Potential chemotherapy with neuropathy    PT Frequency 2x / week   PT Duration 3 weeks   PT Treatment/Interventions ADLs/Self Care Home Management;Manual lymph drainage;Compression bandaging;Scar mobilization;Passive range of motion;Patient/family  education;Taping;Manual techniques;Therapeutic exercise;Therapeutic activities;Orthotic Fit/Training;DME Instruction   PT Next Visit Plan assess and discuss result of MD visit. continue with shoulder exercise and MLD. apple ktape and consider making a chip  pack for anterior throat.    Consulted and Agree with Plan of Care Patient      Patient will benefit from skilled therapeutic intervention in order to improve the following deficits and impairments:  Decreased range of motion, Impaired UE functional use,  Decreased activity tolerance, Decreased knowledge of precautions, Decreased skin integrity, Impaired perceived functional ability, Pain, Decreased knowledge of use of DME, Increased edema, Postural dysfunction, Decreased strength, Impaired sensation, Decreased scar mobility  Visit Diagnosis: Localized swelling, mass and lump, neck  Stiffness of left shoulder joint  Localized swelling, mass and lump, trunk  Pain in left shoulder     Problem List Patient Active Problem List   Diagnosis Date Noted  . Primary malignant neoplasm of left breast with stage 2 nodal metastasis per American Joint Committee on Cancer 7th edition (N2) ([place]) 02/14/2016  . Breast cancer ([place]) 02/02/2016  . 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 Insomnia 09/01/2015  . Breast cancer of upper-outer quadrant of left female breast ([place]) 08/08/2015   Donato Heinz. Owens Shark PT   Norwood Levo 03/29/2016, 5:12 PM  [place], [place], [address] Phone: [phone]   Fax:  [phone]  Name: Alison [name] MRN: [id number] Date of Birth: 07-Apr-1961

## 2016-03-30 ENCOUNTER — Ambulatory Visit
Admission: RE | Admit: 2016-03-30 | Discharge: 2016-03-30 | Disposition: A | Discharge status: Home / Self Care | Payer: 59 | Source: Ambulatory Visit | Attending: Radiation Oncology | Admitting: Radiation Oncology

## 2016-03-30 DIAGNOSIS — C50412 Malignant neoplasm of upper-outer quadrant of left female breast: Secondary | ICD-10-CM

## 2016-03-30 DIAGNOSIS — Z510 Encounter for antineoplastic radiation therapy: Secondary | ICD-10-CM | POA: Diagnosis not present

## 2016-03-30 MED ORDER — RADIAPLEXRX EX GEL
Freq: Once | CUTANEOUS | Status: AC
  Administered 2016-03-30: 12:00:00 via TOPICAL

## 2016-03-30 MED ORDER — ALRA NON-METALLIC DEODORANT (RAD-ONC)
1.0000 "application " | Freq: Once | TOPICAL | Status: AC
  Administered 2016-03-30: 1 via TOPICAL

## 2016-03-30 NOTE — Progress Notes (Signed)
Pt here for patient teaching.  Pt given Radiation and You booklet, skin care instructions, [place]ra deodorant and Radiaplex gel. Pt reports they have not watched the Radiation Therapy Education video, but were given the link to watch at home.  Reviewed areas of pertinence such as fatigue, skin changes, breast tenderness, breast swelling, cough, shortness of breath, earaches and taste changes . Pt able to give teach back of to pat skin, use unscented/gentle soap and drink plenty of water,apply Radiaplex bid, avoid applying anything to skin within 4 hours of treatment, avoid wearing an under wire bra and to use an electric razor if they must shave. Pt verbalizes understanding of information given and will contact nursing with any questions or concerns.     Http://rtanswers.org/treatmentinformation/whattoexpect/index      

## 2016-04-02 ENCOUNTER — Ambulatory Visit
Admission: RE | Admit: 2016-04-02 | Discharge: 2016-04-02 | Disposition: A | Discharge status: Home / Self Care | Payer: 59 | Source: Ambulatory Visit | Attending: Radiation Oncology | Admitting: Radiation Oncology

## 2016-04-02 ENCOUNTER — Encounter: Payer: Self-pay | Admitting: Radiation Oncology

## 2016-04-02 ENCOUNTER — Ambulatory Visit (HOSPITAL_COMMUNITY)
Admission: RE | Admit: 2016-04-02 | Discharge: 2016-04-02 | Disposition: A | Discharge status: Home / Self Care | Payer: 59 | Source: Ambulatory Visit | Attending: Oncology | Admitting: Oncology

## 2016-04-02 ENCOUNTER — Encounter (HOSPITAL_COMMUNITY)
Admission: RE | Admit: 2016-04-02 | Discharge: 2016-04-02 | Disposition: A | Discharge status: Home / Self Care | Payer: 59 | Source: Ambulatory Visit | Attending: Oncology | Admitting: Oncology

## 2016-04-02 ENCOUNTER — Other Ambulatory Visit: Payer: Self-pay | Admitting: *Deleted

## 2016-04-02 VITALS — BP 106/63 | HR 74 | Temp 98.4°F | Ht 63.0 in | Wt 153.5 lb

## 2016-04-02 DIAGNOSIS — Z9012 Acquired absence of left breast and nipple: Secondary | ICD-10-CM | POA: Insufficient documentation

## 2016-04-02 DIAGNOSIS — I2510 Atherosclerotic heart disease of native coronary artery without angina pectoris: Secondary | ICD-10-CM | POA: Insufficient documentation

## 2016-04-02 DIAGNOSIS — C50412 Malignant neoplasm of upper-outer quadrant of left female breast: Secondary | ICD-10-CM

## 2016-04-02 DIAGNOSIS — Z510 Encounter for antineoplastic radiation therapy: Secondary | ICD-10-CM | POA: Diagnosis not present

## 2016-04-02 MED ORDER — PROCHLORPERAZINE MALEATE 10 MG PO TABS: 10.0000 mg | ORAL_TABLET | Freq: Four times a day (QID) | ORAL | 0 refills | Status: DC | PRN

## 2016-04-02 MED ORDER — IOPAMIDOL (ISOVUE-300) INJECTION 61%
75.0000 mL | Freq: Once | INTRAVENOUS | Status: AC | PRN
  Administered 2016-04-02: 75 mL via INTRAVENOUS

## 2016-04-02 MED ORDER — TECHNETIUM TC 99M MEDRONATE IV KIT
25.5000 | PACK | Freq: Once | INTRAVENOUS | Status: AC | PRN
  Administered 2016-04-02: 25.5 via INTRAVENOUS

## 2016-04-02 NOTE — Progress Notes (Signed)
   Weekly Management Note:  outpatient    ICD-9-CM ICD-10-CM   1. Breast cancer of upper-outer quadrant of left female breast (HCC) 174.4 C50.412     Current Dose:  1.8 Gy  Projected Dose: 60.4 Gy   Narrative:  The patient presents for routine under treatment assessment.  CBCT/MVCT images/Port film x-rays were reviewed.  The chart was checked. No new complaints.  Physical Findings:  height is 5\' 3"  (1.6 m) and weight is 153 lb 8 oz (69.6 kg). Her temperature is 98.4 F (36.9 C). Her blood pressure is 106/63 and her pulse is 74.   Wt Readings from Last 3 Encounters:  04/02/16 153 lb 8 oz (69.6 kg)  03/23/16 155 lb 11.2 oz (70.6 kg)  03/02/16 152 lb 8 oz (69.2 kg)   NAD, no skin irritation in RT fields  Impression:  The patient is tolerating radiotherapy.  Plan:  Continue radiotherapy as planned.    ________________________________   Eppie Gibson, M.D.

## 2016-04-02 NOTE — Progress Notes (Signed)
Alison [name] has received 1st treatment today to her left Chest wall and [place] region.  No voiced concerns at this time.  BP 106/63 (BP Location: Right Arm, Patient Position: Sitting, Cuff Size: Normal)   Pulse 74   Temp 98.4 F (36.9 C)   Ht 5\' 3"  (1.6 m)   Wt 153 lb 8 oz (69.6 kg)   BMI 27.19 kg/m    Wt Readings from Last 3 Encounters:  04/02/16 153 lb 8 oz (69.6 kg)  03/23/16 155 lb 11.2 oz (70.6 kg)  03/02/16 152 lb 8 oz (69.2 kg)

## 2016-04-03 ENCOUNTER — Telehealth: Payer: Self-pay | Admitting: Oncology

## 2016-04-03 ENCOUNTER — Ambulatory Visit
Admission: RE | Admit: 2016-04-03 | Discharge: 2016-04-03 | Disposition: A | Discharge status: Home / Self Care | Payer: 59 | Source: Ambulatory Visit | Attending: Radiation Oncology | Admitting: Radiation Oncology

## 2016-04-03 ENCOUNTER — Ambulatory Visit (HOSPITAL_BASED_OUTPATIENT_CLINIC_OR_DEPARTMENT_OTHER): Payer: 59 | Admitting: Oncology

## 2016-04-03 ENCOUNTER — Ambulatory Visit: Payer: 59 | Admitting: Physical Therapy

## 2016-04-03 ENCOUNTER — Other Ambulatory Visit (HOSPITAL_BASED_OUTPATIENT_CLINIC_OR_DEPARTMENT_OTHER): Payer: 59

## 2016-04-03 VITALS — BP 120/80 | HR 82 | Temp 98.9°F | Resp 18 | Ht 63.0 in | Wt 153.4 lb

## 2016-04-03 DIAGNOSIS — M25512 Pain in left shoulder: Secondary | ICD-10-CM

## 2016-04-03 DIAGNOSIS — R221 Localized swelling, mass and lump, neck: Secondary | ICD-10-CM

## 2016-04-03 DIAGNOSIS — C779 Secondary and unspecified malignant neoplasm of lymph node, unspecified: Principal | ICD-10-CM

## 2016-04-03 DIAGNOSIS — C50412 Malignant neoplasm of upper-outer quadrant of left female breast: Secondary | ICD-10-CM

## 2016-04-03 DIAGNOSIS — C50912 Malignant neoplasm of unspecified site of left female breast: Secondary | ICD-10-CM

## 2016-04-03 DIAGNOSIS — M25612 Stiffness of left shoulder, not elsewhere classified: Secondary | ICD-10-CM

## 2016-04-03 DIAGNOSIS — G893 Neoplasm related pain (acute) (chronic): Secondary | ICD-10-CM

## 2016-04-03 DIAGNOSIS — R222 Localized swelling, mass and lump, trunk: Secondary | ICD-10-CM

## 2016-04-03 DIAGNOSIS — Z510 Encounter for antineoplastic radiation therapy: Secondary | ICD-10-CM | POA: Diagnosis not present

## 2016-04-03 DIAGNOSIS — C773 Secondary and unspecified malignant neoplasm of axilla and upper limb lymph nodes: Secondary | ICD-10-CM

## 2016-04-03 LAB — COMPREHENSIVE METABOLIC PANEL
ALBUMIN: 4 g/dL (ref 3.5–5.0)
ALK PHOS: 61 U/L (ref 40–150)
ALT: 23 U/L (ref 0–55)
ANION GAP: 9 meq/L (ref 3–11)
AST: 25 U/L (ref 5–34)
BUN: 17.8 mg/dL (ref 7.0–26.0)
CALCIUM: 10.5 mg/dL — AB (ref 8.4–10.4)
CO2: 25 mEq/L (ref 22–29)
Chloride: 107 mEq/L (ref 98–109)
Creatinine: 1 mg/dL (ref 0.6–1.1)
EGFR: 67 mL/min/{1.73_m2} — AB (ref >90)
Glucose: 83 mg/dl (ref 70–140)
POTASSIUM: 4.3 meq/L (ref 3.5–5.1)
Sodium: 141 mEq/L (ref 136–145)
Total Bilirubin: <0.3 mg/dL (ref 0.20–1.20)
Total Protein: 7.4 g/dL (ref 6.4–8.3)

## 2016-04-03 LAB — CBC WITH DIFFERENTIAL/PLATELET
BASO%: 0.4 % (ref 0.0–2.0)
BASOS ABS: 0 10*3/uL (ref 0.0–0.1)
EOS ABS: 0.3 10*3/uL (ref 0.0–0.5)
EOS%: 5.2 % (ref 0.0–7.0)
HEMATOCRIT: 38.2 % (ref 34.8–46.6)
HEMOGLOBIN: 12.7 g/dL (ref 11.6–15.9)
LYMPH#: 1.2 10*3/uL (ref 0.9–3.3)
LYMPH%: 23.4 % (ref 14.0–49.7)
MCH: 30.2 pg (ref 25.1–34.0)
MCHC: 33.2 g/dL (ref 31.5–36.0)
MCV: 90.9 fL (ref 79.5–101.0)
MONO#: 0.5 10*3/uL (ref 0.1–0.9)
MONO%: 10.3 % (ref 0.0–14.0)
NEUT#: 3.2 10*3/uL (ref 1.5–6.5)
NEUT%: 60.7 % (ref 38.4–76.8)
PLATELETS: 389 10*3/uL (ref 145–400)
RBC: 4.2 10*6/uL (ref 3.70–5.45)
RDW: 15 % — AB (ref 11.2–14.5)
WBC: 5.3 10*3/uL (ref 3.9–10.3)

## 2016-04-03 MED ORDER — PROCHLORPERAZINE MALEATE 10 MG PO TABS: 10.0000 mg | ORAL_TABLET | Freq: Four times a day (QID) | ORAL | 1 refills | Status: DC | PRN

## 2016-04-03 MED ORDER — OXYCODONE HCL 5 MG PO TABS: 5.0000 mg | ORAL_TABLET | ORAL | 0 refills | Status: DC | PRN

## 2016-04-03 MED ORDER — OXYCODONE HCL ER 20 MG PO T12A: 40.0000 mg | EXTENDED_RELEASE_TABLET | Freq: Two times a day (BID) | ORAL | 0 refills | Status: DC

## 2016-04-03 NOTE — Progress Notes (Signed)
[facility]  [phone]) [phone] [phone]) [phone]   ID: Alison [name] DOB: 06-07-1961  MR#: [id number]  RCB#:[id number]  Patient Care Team: [name], MD as PCP - General (Internal Medicine) Erroll Luna, MD as Consulting Physician (General Surgery) Chauncey Cruel, MD as Consulting Physician (Oncology) Aloha Gell, MD as Consulting Physician (Obstetrics and Gynecology) Harriett Sine, MD as Consulting Physician (Dermatology) Eppie Gibson, MD as Attending Physician (Radiation Oncology) PCP: [name], MD OTHER MD:  CHIEF COMPLAINT: Estrogen receptor positive breast cancer  CURRENT TREATMENT: Adjuvant radiation, capecitabine  BREAST CANCER HISTORY: From the original intake note:  Alison [name] had routine screening mammography with tomography [facility] February 20 11/14/2014 showing a possible mass in the right breast. Diagnostic right mammography with right breast ultrasonography 11/01/2014 found the breast density to be category C. In the upper outer quadrant of the right breast there was a partially obscured mass which on physical exam was palpable as an area of thickening at the 10:00 position 8 cm from the nipple. Ultrasound showed multiple cysts in the upper outer right breast corresponding to the mass in question.  In November 2016 the patient felt a change in the upper left breast and brought it to her primary care physician's attention. Diagnostic left mammography with tomosynthesis and left breast ultrasonography at [facility] December 01/14/2015 found trabecular thickening throughout the left breast with skin thickening, but no circumscribed mass or suspicious calcifications. A rounded mass in the left axilla measured 6 mm. There was an additional mildly prominent lymph node in the left axilla which was what the patient had been palpating. Ultrasonography showed ill-defined swelling throughout the inner left breast with  overlying skin thickening.. At the 2:00 position 4 cm from the nipple there was an irregular hypoechoic mass measuring 2.3 cm. A second mass at the 1:00 area 3 cm from the nipple measured 1 cm. The distance between these 2 masses was 1.8 cm. There was also a round hypoechoic left axillary mass measuring 0.6 cm. Overall the was an area of 3.7 cm of mixed echogenicity corresponding to the area of palpable soft tissue swelling.   On 08/04/2015 the patient underwent biopsy of the mass at the 2:00 axis 4 cm from the nipple and a second biopsy was performed of the hypoechoic mass at the 1:00 position 3 cm from the nipple. Biopsy of the suspicious nodule in the lower left axilla was attempted but could not be completed as the mass became obscured after administration of lidocaine.  The pathology from both left breast biopsies 08/04/2015 ([id number]) showed invasive ductal carcinoma, grade 3. The prognostic profile obtained from the larger tumor showed estrogen receptor to be strongly positive at 95%, progesterone receptor positive at 30%, with an MIB-1 of 12%, and no HER-2 amplification, the signals ratio being 1.22 and the number per cell 2.20.  On 08/09/2015 the patient underwent biopsy of this suspicious left axillary lymph node noted above, and this showed ([id number]) metastatic carcinoma with extracapsular extension.  The patient's subsequent history is as detailed below  INTERVAL HISTORY: Alison [name] returns for follow up of her estrogen receptor positive breast cancer accompanied by her husband [name]. She started adjuvant radiation treatments yesterday. She is not anxious about that therapy. She is receiving concurrent capecitabine at reduced doses, namely 1 g twice daily on radiation days only. She actually started this a little bit early, but now is taking it appropriately. So far she has had no diarrhea, mouth  sores, or palmar plantar erythrodysesthesia. She does have some nausea from the medication,  and she treats that with Compazine.  REVIEW OF SYSTEMS: Alison [name] continues to have considerable pain in her left arm and left chest wall area. She is taking OxyContin 20 mg twice daily, and oxycodone as needed. This controls the pain sufficiently well that she rarely needs the breakthrough oxycodone. She is not constipated despite the narcotics. She is receiving some physical therapy for the severely restricted left upper extremity range of motion. She is not exercising regularly. A detailed review of systems was otherwise stable.  PAST MEDICAL HISTORY: Past Medical History:  Diagnosis Date  . Asthma     seasonal asthma  . Breast cancer ([place])   . Complication of anesthesia 2007   aspiration pneumonia post hysterectomy.  Blood pressure would drop low- but no problems 01/2016- surgery  . History of blood transfusion   . Hypertension   . Neuropathy ([facility])    with chemo  . Osteopenia determined by x-ray   . Pneumonia    1980's and 1990's  . Raynaud's disease   . Rectovaginal fistula    repaired 2014  . Skin cancer 2014   basal- back    PAST SURGICAL HISTORY: Past Surgical History:  Procedure Laterality Date  . ABDOMINAL HYSTERECTOMY  2007   with BSO  . APPENDECTOMY  2007  . AXILLARY LYMPH NODE DISSECTION Left 02/14/2016  . AXILLARY LYMPH NODE DISSECTION Left 02/14/2016   Procedure: LEFT AXILLARY LYMPH NODE DISSECTION;  Surgeon: [name], MD;  Location: [facility];  Service: General;  Laterality: Left;  . BREAST RECONSTRUCTION WITH PLACEMENT OF TISSUE EXPANDER AND FLEX HD (ACELLULAR HYDRATED DERMIS) Left 02/02/2016   Procedure: IMMEDIATE LEFT BREAST RECONSTRUCTION WITH PLACEMENT OF TISSUE EXPANDER AND FLEX HD (ACELLULAR HYDRATED DERMIS);  Surgeon: [name], DO;  Location: [facility];  Service: Plastics;  Laterality: Left;  . BREAST SURGERY Left 02/02/16   mastectomy with reconstruction  . c-section    . COLON RESECTION  4/'2014  . HERNIA REPAIR    . Port- a- cath  placement  07/2015  . PORT-A-CATH REMOVAL Right 02/02/2016   Procedure: REMOVAL PORT-A-CATH;  Surgeon: [name], MD;  Location: [facility];  Service: General;  Laterality: Right;  . RADIOACTIVE SEED GUIDED AXILLARY SENTINEL LYMPH NODE Left 02/02/2016   Procedure: LEFT BREAST MASTECTOMY AND RADIOACTIVE SEED GUIDED LEFT SENTINEL LYMPH NODE BIOPSY ;  Surgeon: [name], MD;  Location: [facility];  Service: General;  Laterality: Left;    FAMILY HISTORY Family History  Problem Relation Age of Onset  . Lung cancer Father    the patient's father died from lung cancer at the age of 32 in the setting of tobacco abuse. The patient's mother died at the age of 56 with pancreatic cancer. The patient had no siblings. There is no history of breast or ovarian cancer in the family.  GYNECOLOGIC HISTORY:  No LMP recorded. Patient has had a hysterectomy. Menarche age 25, first live birth age 51. The patient is GX P1. She is status post total hysterectomy with bilateral salpingo-oophorectomy. She has been on hormone replacement since that time and is tapering to off at the time of this dictation.   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 She used to work at [employer]. Her husband [name] works for [employer] division at a [employer] their son [name] is currently at home.    ADVANCED DIRECTIVES:  Not in place   HEALTH MAINTENANCE: Social History  Substance Use Topics  . Smoking status: Former Smoker    Packs/day: 1.00    Years: 10.00    Types: Cigarettes    Quit date: 11/25/2008  . Smokeless tobacco: Never Used     Comment: on and off smoker never consistent  . Alcohol use 2.4 oz/week    4 Glasses of wine per week     Colonoscopy: April 2014/ Raynaldo Opitz [facility]  PAP: s/p hysterectomy  Bone density: August 2016/ osteopenia  Lipid panel:  Allergies  Allergen Reactions  . Sulfa Antibiotics Other (See Comments)    Blisters in mouth    . Zofran [Ondansetron Hcl] Other (See Comments)    Severe Headaches     Current Outpatient Prescriptions  Medication Sig Dispense Refill  . capecitabine (XELODA) 500 MG tablet Take 2 tablets (1,000 mg total) by mouth 2 (two) times daily after a meal. 120 tablet 1  . cetirizine (ZYRTEC) 10 MG tablet Take 10 mg by mouth daily.    . Cholecalciferol (VITAMIN D3) 2000 UNITS capsule Take 2,000 Units by mouth daily.     . eszopiclone (LUNESTA) 2 MG TABS tablet Take 1 tablet (2 mg total) by mouth at bedtime. Take immediately before bedtime 30 tablet 0  . Fenofibric Acid 105 MG TABS Take 1 tablet by mouth daily.    . fluticasone (FLONASE) 50 MCG/ACT nasal spray Place 2 sprays into both nostrils daily. Reported on 11/18/2015    . gabapentin (NEURONTIN) 300 MG capsule Take 1 capsule (300 mg total) by mouth 3 (three) times daily. Patient takes 100 mg 3 times a day 270 capsule 1  . ibuprofen (ADVIL,MOTRIN) 200 MG tablet Take 400 mg by mouth every 6 (six) hours as needed for headache, mild pain or moderate pain. Reported on 01/06/2016    . LORazepam (ATIVAN) 0.5 MG tablet Take 0.5 mg by mouth at bedtime.    . methocarbamol (ROBAXIN) 500 MG tablet Take 1 tablet (500 mg total) by mouth every 6 (six) hours as needed for muscle spasms. 40 tablet 1  . Multiple Vitamin (MULTIVITAMIN) tablet Take 1 tablet by mouth daily.    Marland Kitchen NIFEdipine (PROCARDIA-XL/ADALAT-CC/NIFEDICAL-XL) 30 MG 24 hr tablet Take 30 mg by mouth daily.    Marland Kitchen olmesartan (BENICAR) 20 MG tablet Take 1 tablet (20 mg total) by mouth daily. 90 tablet 4  . omega-3 acid ethyl esters (LOVAZA) 1 g capsule Take 1 g by mouth 2 (two) times daily.    Marland Kitchen oxyCODONE (OXY IR/ROXICODONE) 5 MG immediate release tablet Take 1-2 tablets (5-10 mg total) by mouth every 4 (four) hours as needed for moderate pain, severe pain or breakthrough pain. 120 tablet 0  . oxyCODONE (OXYCONTIN) 20 mg 12 hr tablet Take 2 tablets (40 mg total) by mouth every 12 (twelve) hours. 120 tablet  0  . polyethylene glycol (MIRALAX / GLYCOLAX) packet Take 17 g by mouth daily as needed for mild constipation. Reported on 01/06/2016    . Probiotic Product (PROBIOTIC DAILY PO) Take 1 capsule by mouth daily. [place] prochlorperazine (COMPAZINE) 10 MG tablet Take 1 tablet (10 mg total) by mouth every 6 (six) hours as needed (Nausea or vomiting). 30 tablet 1  . prochlorperazine (COMPAZINE) 10 MG tablet Take 1 tablet (10 mg total) by mouth every 6 (six) hours as needed for nausea or vomiting. 120 tablet 0  . traZODone (DESYREL) 150 MG  tablet Take 150 mg by mouth at bedtime.     No current facility-administered medications for this visit.     OBJECTIVE: Middle-aged white woman  Vitals:   04/03/16 0957  BP: 120/80  Pulse: 82  Resp: 18  Temp: 98.9 F (37.2 C)     Body mass index is 27.17 kg/Alison.    ECOG FS:1 - Symptomatic but completely ambulatory  Significant moon facies and fat redistribution around the neck Sclerae unicteric, pupils round and equal Oropharynx clear and moist-- no thrush or other lesions No palpable cervical or supraclavicular adenopathy Lungs no rales or rhonchi Heart regular rate and rhythm Abd soft, obese, nontender, positive bowel sounds MSK no focal spinal tenderness, greatly decreased range of motion left upper extremity Neuro: nonfocal, well oriented, appropriate affect Breasts: The right breast is unremarkable. The left breast is status post mastectomy with expander in place. There is no evidence of chest wall recurrence. The left axilla is benign.    LAB RESULTS:  CMP     Component Value Date/Time   NA 140 03/02/2016 0801   K 4.0 03/02/2016 0801   CL 110 02/15/2016 0330   CO2 20 (L) 03/02/2016 0801   GLUCOSE 150 (H) 03/02/2016 0801   BUN 12.8 03/02/2016 0801   CREATININE 0.8 03/02/2016 0801   CALCIUM 9.8 03/02/2016 0801   PROT 7.0 03/02/2016 0801   ALBUMIN 3.8 03/02/2016 0801   AST 24 03/02/2016 0801    ALT 23 03/02/2016 0801   ALKPHOS 41 03/02/2016 0801   BILITOT 0.30 03/02/2016 0801   GFRNONAA >60 02/15/2016 0330   GFRAA >60 02/15/2016 0330    INo results found for: SPEP, UPEP  Lab Results  Component Value Date   WBC 5.3 04/03/2016   NEUTROABS 3.2 04/03/2016   HGB 12.7 04/03/2016   HCT 38.2 04/03/2016   MCV 90.9 04/03/2016   PLT 389 04/03/2016      Chemistry      Component Value Date/Time   NA 140 03/02/2016 0801   K 4.0 03/02/2016 0801   CL 110 02/15/2016 0330   CO2 20 (L) 03/02/2016 0801   BUN 12.8 03/02/2016 0801   CREATININE 0.8 03/02/2016 0801      Component Value Date/Time   CALCIUM 9.8 03/02/2016 0801   ALKPHOS 41 03/02/2016 0801   AST 24 03/02/2016 0801   ALT 23 03/02/2016 0801   BILITOT 0.30 03/02/2016 0801       No results found for: LABCA2  No components found for: LABCA125  No results for input(s): INR in the last 168 hours.  Urinalysis    Component Value Date/Time   COLORURINE YELLOW 11/12/2015 0837   APPEARANCEUR CLEAR 11/12/2015 0837   LABSPEC 1.006 11/12/2015 0837   PHURINE 6.5 11/12/2015 0837   GLUCOSEU NEGATIVE 11/12/2015 0837   HGBUR MODERATE (A) 11/12/2015 0837   BILIRUBINUR NEGATIVE 11/12/2015 0837   KETONESUR NEGATIVE 11/12/2015 0837   PROTEINUR NEGATIVE 11/12/2015 0837   NITRITE NEGATIVE 11/12/2015 0837   LEUKOCYTESUR MODERATE (A) 11/12/2015 0837    STUDIES: Ct Chest W Contrast  Result Date: 04/02/2016 CLINICAL DATA:  Left-sided breast cancer.  Restaging. EXAM: CT CHEST WITH CONTRAST TECHNIQUE: Multidetector CT imaging of the chest was performed during intravenous contrast administration. CONTRAST:  31m ISOVUE-300 IOPAMIDOL (ISOVUE-300) INJECTION 61% COMPARISON:  08/12/2015 chest CT.  Breast MR of 10/19/2015. FINDINGS: Cardiovascular: Mild aortic and branch vessel atherosclerosis. Heart size upper normal, without pericardial effusion. Lad and likely left main coronary artery atherosclerosis. No central pulmonary embolism,  on  this non-dedicated study. Mediastinum/Nodes: No supraclavicular adenopathy. Left axillary node dissection and interval mastectomy. No axillary adenopathy. No mediastinal or hilar adenopathy. No internal mammary adenopathy. Lungs/Pleura: No pleural fluid. Minimal motion degradation in the mid chest. Mild beam hardening artifact from left breast/ chest soft tissue expander. Clear lungs. Upper Abdomen: Normal imaged portions of the liver, spleen, stomach, pancreas, gallbladder, adrenal glands, kidneys. The intrahepatic ducts are borderline prominent. The common duct is mildly dilated for age, including at 10 mm on coronal image 33/series 6. Compare 8 mm on the prior. Common duct is incompletely imaged. Abdominal aortic and branch vessel atherosclerosis. Musculoskeletal: No acute osseous abnormality. IMPRESSION: 1. Interval left mastectomy with soft tissue expander in the left chest wall. 2. No evidence of metastatic disease within the chest. 3. Age advanced coronary artery atherosclerosis. Recommend assessment of coronary risk factors and consideration of medical therapy. 4. Development of minimal intrahepatic and extrahepatic ductal prominence. Incompletely imaged common duct. Correlate with bilirubin levels. Especially if these are elevated, consider dedicated MRCP. Electronically Signed   By: Abigail Miyamoto Alison.D.   On: 04/02/2016 13:34   Nm Bone Scan Whole Body  Result Date: 04/02/2016 CLINICAL DATA:  Breast cancer upper outer quadrant LEFT breast, mastectomy 02/02/2016, no bone pain, known lumbar fracture per patient EXAM: NUCLEAR MEDICINE WHOLE BODY BONE SCAN TECHNIQUE: Whole body anterior and posterior images were obtained approximately 3 hours after intravenous injection of radiopharmaceutical. RADIOPHARMACEUTICALS:  25.5 mCi Technetium-30mMDP IV COMPARISON:  08/12/2015; correlation CT chest 04/02/2016 FINDINGS: Minimal uptake at the knees, typically degenerative. Uptake at lateral aspect of the cervical spine  bilaterally, typically degenerative, noted previously as well. No definite sites of abnormal osseous tracer accumulation identified to suggest osseous metastatic disease. Expected urinary tract and soft tissue distribution of tracer. IMPRESSION: No definite scintigraphic evidence of osseous metastatic disease as above. Electronically Signed   By: MLavonia DanaM.D.   On: 04/02/2016 15:33   PROTOCOLS: B=WELL, ABC and PALLAS under scrutiny  ASSESSMENT: 55y.o. [place] woman status post 2 separate masses in the left breast upper outer quadrant 08/04/2015, clinically mT2 N1, stage IIb/IIIa invasive ductal carcinoma, grade 3, the larger mass being estrogen and progesterone receptor positive, HER-2 negative, with an MIB-1 of 12%  (a) biopsy of a left axillary lymph node 08/09/2015 was positive, with extracapsular extension  (1) neoadjuvant chemotherapy started 08/25/2015 consisting of doxorubicin and cyclophosphamide in dose dense fashion 4, completed 10/06/2015,  followed by weekly paclitaxel 10 completed 12/23/2015  (a) final 2 planned cycles of weekly paclitaxel omitted because of neuropathy concerns  (2) status post left mastectomy with targeted axillary dissection 02/02/2016 for a ypT2 ypN2a, stage IIIa invasive ductal carcinoma, grade 2, with negative margins, repeat prognostic panel showing estrogen receptor positive, but progesterone receptor and HER-2 negative  (a) completion axillary lymph node dissection 02/14/2016 showed an additional 2 out of 8 lymph nodes to be involved (final count 6 out of 10 lymph nodes positive  (b) expander placement at the time of left mastectomy  (3) adjuvant capecitabine starting concurrently with radiation  (4) adjuvant  radiation started 04/02/2016, to be completed 05/17/2016  (5) anti-estrogens to follow completion of local treatment  (a) bone density August 2016 showed osteopenia  (b) the patient is status post total abdominal hysterectomy with bilateral  salpingo-oophorectomy.  (6) postoperative pain: Starting OxyContin 20 mg twice a day with oxycodone as needed for breakthrough pain 03/02/2016  (Bowel prophylaxis in placea)   PLAN:  LDianiais still far  from recovering from her recent treatments. In particular she is still on narcotics for her considerable pain, localizing to the left shoulder area. Hopefully this will benefit from continuing physical therapy. I did refill her pain medications today. Over the next few months I hope to gradually diminished dose and substitute Neurontin and tramadol, which I think will be more appropriate in the longer-term.  We discussed again how to take the capecitabine, namely 2 tablets twice daily after a meal or with food but only on radiation days. So far she is tolerating this well except for nausea, but the nausea is well-controlled on Compazine. She takes this approximately 4 times a day and that also was refilled today.  Given that she is on the Compazine I have set her up for lab work in a week and 3 weeks. She will then return to see me with again lab work right at the end of her radiation treatments and at that time, assuming she is recovering well from those treatments, we will initiate the full dose capecitabine, to be continued for total of 6 months (into February 2018.)  She has a good understanding of this plan, and agrees with it. She will make an effort at starting a walking program. She will call with any problems that may develop before her next visit here.  :Chauncey Cruel, MD   04/03/2016 10:16 AM

## 2016-04-03 NOTE — Therapy (Signed)
[place], [place], [address] Phone: [phone]   Fax:  [phone]  Physical Therapy Treatment  Patient Details  Name: Alison [name] MRN: [id number] Date of Birth: 1961/02/28 Referring Provider: Dr.Byerly   Encounter Date: 04/03/2016      PT End of Session - 04/03/16 1741    Visit Number 6   Number of Visits 9   Date for PT Re-Evaluation 04/26/16   PT Start Time [id number]   PT Stop Time 1600   PT Time Calculation (min) 45 min   Activity Tolerance Patient tolerated treatment well   Behavior During Therapy [facility] for tasks assessed/performed      Past Medical History:  Diagnosis Date  . Asthma     seasonal asthma  . Breast cancer ([place])   . Complication of anesthesia 2007   aspiration pneumonia post hysterectomy.  Blood pressure would drop low- but no problems 01/2016- surgery  . History of blood transfusion   . Hypertension   . Neuropathy ([place])    with chemo  . Osteopenia determined by x-ray   . Pneumonia    1980's and 1990's  . Raynaud's disease   . Rectovaginal fistula    repaired 2014  . Skin cancer 2014   basal- back    Past Surgical History:  Procedure Laterality Date  . ABDOMINAL HYSTERECTOMY  2007   with BSO  . APPENDECTOMY  2007  . AXILLARY LYMPH NODE DISSECTION Left 02/14/2016  . AXILLARY LYMPH NODE DISSECTION Left 02/14/2016   Procedure: LEFT AXILLARY LYMPH NODE DISSECTION;  Surgeon: [name], MD;  Location: [place];  Service: General;  Laterality: Left;  . BREAST RECONSTRUCTION WITH PLACEMENT OF TISSUE EXPANDER AND FLEX HD (ACELLULAR HYDRATED DERMIS) Left 02/02/2016   Procedure: IMMEDIATE LEFT BREAST RECONSTRUCTION WITH PLACEMENT OF TISSUE EXPANDER AND FLEX HD (ACELLULAR HYDRATED DERMIS);  Surgeon: [name], DO;  Location: [place];  Service: Plastics;  Laterality: Left;  . BREAST SURGERY Left 02/02/16   mastectomy with reconstruction  . c-section    .  COLON RESECTION  4/'2014  . HERNIA REPAIR    . Port- a- cath placement  07/2015  . PORT-A-CATH REMOVAL Right 02/02/2016   Procedure: REMOVAL PORT-A-CATH;  Surgeon: [name], MD;  Location: [place];  Service: General;  Laterality: Right;  . RADIOACTIVE SEED GUIDED AXILLARY SENTINEL LYMPH NODE Left 02/02/2016   Procedure: LEFT BREAST MASTECTOMY AND RADIOACTIVE SEED GUIDED LEFT SENTINEL LYMPH NODE BIOPSY ;  Surgeon: [name], MD;  Location: [place];  Service: General;  Laterality: Left;    There were no vitals filed for this visit.      Subjective Assessment - 04/03/16 1527    Subjective pt had appointment with Dr. [name] today and was told that the disease is in remission.  Scans were clear with no signs of blood clot  He feels that her swelling could be fat or may due to use of steroids. Either way, there is no longer precaution for treatment    Pertinent History Breast cancer diagnosed November2016 She had chemotherapy. but did have some neuropathy so it had to be stopped. On June 8, she had a mastectomy with immedicate expander placement and with first set of lymphnodes removed, On  June 20  she went for complete axillary lymph node dissection. She says she has had always been "full necked" but has neck and upper body fullness since the cancer was diagnosed.  Patient Stated Goals to help with edema    Currently in Pain? No/denies  had 2 oxycodone prior to radiation treatment                          [facility] Adult PT Treatment/Exercise - 04/03/16 0001      Self-Care   Self-Care Other Self-Care Comments   Other Self-Care Comments  chip pack to try to move fluid at anterior neck      Neck Exercises: Seated   Other Seated Exercise lymphatic flow yoga series      Manual Therapy   Manual Lymphatic Drainage (MLD) In supine, short neck and bilateal shoulder collectors, anterior thoat toward sides, right axillary nodes, anterioer  interaxillary anastamosis, superficial and deep abdominals. left inguinals and axillo-inguinal anastamosis, bilateral supraclavicular area toward posterior cervical nodes. then to right sidelying for posterior interaxillary anastamosis, upper back toward lower back.    Kinesiotex Edema     Kinesiotix   Edema skinkote. kinesiotape in fan shape from left back toward left inguinals.                 PT Education - 04/03/16 1741    Education provided Yes   Education Details wear chip pack about an hour a day and assess for skin markings indicating fluid movement                 [facility] Term Clinic Goals - 03/20/16 1742      CC Long Term Goal  #1   Title Patient will report a decrease in pain by 50% so they can perform daily activities with greater ease   Status On-going     CC Long Term Goal  #2   Title Patient will improve shoulder flexion range of motion to 150 degrees to perform activities of daily living and household chores with greater ease   Baseline 158 on 03/20/2016   Status Achieved     CC Long Term Goal  #3   Title Patient with verbalize an understanding of lymphedema risk reduction precautions   Status On-going     CC Long Term Goal  #4   Title Patient will be independent in self manual lymph drainage     CC Long Term Goal  #5   Title Pt will report her edema symptoms in upper body are about 25% better    Status On-going            Plan - 04/03/16 1742    Clinical Impression Statement pt indicated radiation is going much better.  She also appears to have less abdominal swelling today. She is relieved after her visit with Dr. [name] today and is ready to work toward controlling her swelling.    Rehab Potential Good   Clinical Impairments Affecting Rehab Potential chemotherapy with neuropathy    PT Treatment/Interventions ADLs/Self Care Home Management;Manual lymph drainage;Compression bandaging;Scar mobilization;Passive range of motion;Patient/family  education;Taping;Manual techniques;Therapeutic exercise;Therapeutic activities;Orthotic Fit/Training;DME Instruction   PT Next Visit Plan assess  ktape and chip  pack for anterior throat. continue with manual lymph drainage and instruction in self management. Attempt measrement of throat and abdomen for    Consulted and Agree with Plan of Care Patient      Patient will benefit from skilled therapeutic intervention in order to improve the following deficits and impairments:  Decreased range of motion, Impaired UE functional use, Decreased activity tolerance, Decreased knowledge of precautions, Decreased skin integrity, Impaired perceived functional ability,  Pain, Decreased knowledge of use of DME, Increased edema, Postural dysfunction, Decreased strength, Impaired sensation, Decreased scar mobility  Visit Diagnosis: Localized swelling, mass and lump, neck  Stiffness of left shoulder joint  Localized swelling, mass and lump, trunk  Pain in left shoulder     Problem List Patient Active Problem List   Diagnosis Date Noted  . Primary malignant neoplasm of left breast with stage 2 nodal metastasis per American Joint Committee on Cancer 7th edition (N2) ([place]) 02/14/2016  . Breast cancer ([place]) 02/02/2016  . Chemotherapy-induced neuropathy ([place]) 12/30/2015  . Insomnia 09/01/2015  . Breast cancer of upper-outer quadrant of left female breast ([place]) 08/08/2015   Donato Heinz. Owens Shark PT  [place] Levo 04/03/2016, [place] [address], [place], [address] Phone: [phone]   Fax:  [phone]  Name: Alison [name] MRN: [id number] Date of Birth: 1961-08-23

## 2016-04-03 NOTE — Telephone Encounter (Signed)
appt made and avs printed °

## 2016-04-04 ENCOUNTER — Ambulatory Visit
Admission: RE | Admit: 2016-04-04 | Discharge: 2016-04-04 | Disposition: A | Discharge status: Home / Self Care | Payer: 59 | Source: Ambulatory Visit | Attending: Radiation Oncology | Admitting: Radiation Oncology

## 2016-04-04 DIAGNOSIS — Z510 Encounter for antineoplastic radiation therapy: Secondary | ICD-10-CM | POA: Diagnosis not present

## 2016-04-05 ENCOUNTER — Ambulatory Visit: Payer: 59 | Admitting: Physical Therapy

## 2016-04-05 ENCOUNTER — Ambulatory Visit
Admission: RE | Admit: 2016-04-05 | Discharge: 2016-04-05 | Disposition: A | Discharge status: Home / Self Care | Payer: 59 | Source: Ambulatory Visit | Attending: Radiation Oncology | Admitting: Radiation Oncology

## 2016-04-05 DIAGNOSIS — M25512 Pain in left shoulder: Secondary | ICD-10-CM

## 2016-04-05 DIAGNOSIS — M25612 Stiffness of left shoulder, not elsewhere classified: Secondary | ICD-10-CM

## 2016-04-05 DIAGNOSIS — R222 Localized swelling, mass and lump, trunk: Secondary | ICD-10-CM

## 2016-04-05 DIAGNOSIS — R221 Localized swelling, mass and lump, neck: Secondary | ICD-10-CM | POA: Diagnosis not present

## 2016-04-05 DIAGNOSIS — Z510 Encounter for antineoplastic radiation therapy: Secondary | ICD-10-CM | POA: Diagnosis not present

## 2016-04-05 NOTE — Therapy (Signed)
[place], [place], [address] Phone: [phone]   Fax:  [phone]  Physical Therapy Treatment  Patient Details  Name: Alison [name] MRN: [id number] Date of Birth: 09/13/1960 Referring Provider: Dr.Byerly   Encounter Date: 04/05/2016      PT End of Session - 04/05/16 1819    Visit Number 7   Number of Visits 9   Date for PT Re-Evaluation 04/26/16   PT Start Time [id number]   PT Stop Time 1600   PT Time Calculation (min) 45 min   Activity Tolerance Patient tolerated treatment well   Behavior During Therapy [facility] for tasks assessed/performed      Past Medical History:  Diagnosis Date  . Asthma     seasonal asthma  . Breast cancer ([place])   . Complication of anesthesia 2007   aspiration pneumonia post hysterectomy.  Blood pressure would drop low- but no problems 01/2016- surgery  . History of blood transfusion   . Hypertension   . Neuropathy ([place])    with chemo  . Osteopenia determined by x-ray   . Pneumonia    1980's and 1990's  . Raynaud's disease   . Rectovaginal fistula    repaired 2014  . Skin cancer 2014   basal- back    Past Surgical History:  Procedure Laterality Date  . ABDOMINAL HYSTERECTOMY  2007   with BSO  . APPENDECTOMY  2007  . AXILLARY LYMPH NODE DISSECTION Left 02/14/2016  . AXILLARY LYMPH NODE DISSECTION Left 02/14/2016   Procedure: LEFT AXILLARY LYMPH NODE DISSECTION;  Surgeon: [name], MD;  Location: [place];  Service: General;  Laterality: Left;  . BREAST RECONSTRUCTION WITH PLACEMENT OF TISSUE EXPANDER AND FLEX HD (ACELLULAR HYDRATED DERMIS) Left 02/02/2016   Procedure: IMMEDIATE LEFT BREAST RECONSTRUCTION WITH PLACEMENT OF TISSUE EXPANDER AND FLEX HD (ACELLULAR HYDRATED DERMIS);  Surgeon: [name], DO;  Location: [place];  Service: Plastics;  Laterality: Left;  . BREAST SURGERY Left 02/02/16   mastectomy with reconstruction  . c-section    .  COLON RESECTION  4/'2014  . HERNIA REPAIR    . Port- a- cath placement  07/2015  . PORT-A-CATH REMOVAL Right 02/02/2016   Procedure: REMOVAL PORT-A-CATH;  Surgeon: [name], MD;  Location: [place];  Service: General;  Laterality: Right;  . RADIOACTIVE SEED GUIDED AXILLARY SENTINEL LYMPH NODE Left 02/02/2016   Procedure: LEFT BREAST MASTECTOMY AND RADIOACTIVE SEED GUIDED LEFT SENTINEL LYMPH NODE BIOPSY ;  Surgeon: [name], MD;  Location: [place];  Service: General;  Laterality: Left;    There were no vitals filed for this visit.      Subjective Assessment - 04/05/16 1535    Subjective Pt states she is very nauseated since she started the chemotherapy.  She has lost about 7 pounds since last week                LYMPHEDEMA/ONCOLOGY QUESTIONNAIRE - 04/05/16 1538      Lymphedema Assessments   Lymphedema Assessments (P)  Head and Neck     Head and Neck   4 cm superior to sternal notch around neck (P)  53 cm   6 cm superior to sternal notch around neck (P)  51.3 cm                  [facility] Adult PT Treatment/Exercise - 04/05/16 0001      Lumbar Exercises: Aerobic   Stationary  Bike 6 minutes     Shoulder Exercises: Pulleys   Flexion 2 minutes     Manual Therapy   Manual Lymphatic Drainage (MLD) In sitting,  short neck and bilateal shoulder collectors, anterior thoat toward sides, right axillary nodes, anterioer interaxillary anastamosis, superficial and deep abdominals. left inguinals and axillo-inguinal anastamosis, bilateral supraclavicular area toward posterior cervical nodes. then to right sidelying for posterior interaxillary anastamosis, upper back toward lower back.      Kinesiotix   Edema skinkote. kinesiotape in fan shape from left shouler and upper  back toward left inguinals.                         [facility] Goals - 04/05/16 1824      CC Long Term Goal  #1   Title Patient will report a  decrease in pain by 50% so they can perform daily activities with greater ease   Status On-going     CC Long Term Goal  #2   Title Patient will improve shoulder flexion range of motion to 150 degrees to perform activities of daily living and household chores with greater ease   Status Achieved     CC Long Term Goal  #3   Title Patient with verbalize an understanding of lymphedema risk reduction precautions   Status On-going     CC Long Term Goal  #4   Title Patient will be independent in self manual lymph drainage     CC Long Term Goal  #5   Status On-going            Plan - 04/05/16 1820    Clinical Impression Statement Pt is having trouble with nausea from chemotherapy and tightness in left shoulder from radiation.  Asked her to contact Dory [place] at cancer center for nutritional support. Encouraged her to continue with shoulder stretching. Meaused her neck today.  She appears to have decreased swelling, but she has lost weight since last week. She feels that kinesiotape is helpful to her back. Upgraded program to inculde bike for lymphatic stimulation and pulleys for shoulder stretch    PT Next Visit Plan Continue with bike and pulleys for lymphatic stimulation and shoulder stretch continue with manual lymph drainage with extra time on supraclavicular area toward posterior cervical. Assess chip pack use if pt brings it in.   Consulted and Agree with Plan of Care Patient      Patient will benefit from skilled therapeutic intervention in order to improve the following deficits and impairments:  Decreased range of motion, Impaired UE functional use, Decreased activity tolerance, Decreased knowledge of precautions, Decreased skin integrity, Impaired perceived functional ability, Pain, Decreased knowledge of use of DME, Increased edema, Postural dysfunction, Decreased strength, Impaired sensation, Decreased scar mobility  Visit Diagnosis: Localized swelling, mass and lump,  neck  Stiffness of left shoulder joint  Pain in left shoulder  Localized swelling, mass and lump, trunk     Problem List Patient Active Problem List   Diagnosis Date Noted  . Primary malignant neoplasm of left breast with stage 2 nodal metastasis per American Joint Committee on Cancer 7th edition (N2) ([place]) 02/14/2016  . Breast cancer ([place]) 02/02/2016  . Chemotherapy-induced neuropathy ([place]) 12/30/2015  . Insomnia 09/01/2015  . Breast cancer of upper-outer quadrant of left female breast ([place]) 08/08/2015   Donato Heinz. Owens Shark, PT  [place]o 04/05/2016, 6:26 PM  [place], [place], [address] Phone: [phone]   Fax:  [phone]  Name: DALEYZAH SCHONER MRN: [id number] Date of Birth: 08/02/1961

## 2016-04-06 ENCOUNTER — Ambulatory Visit
Admission: RE | Admit: 2016-04-06 | Discharge: 2016-04-06 | Disposition: A | Discharge status: Home / Self Care | Payer: 59 | Source: Ambulatory Visit | Attending: Radiation Oncology | Admitting: Radiation Oncology

## 2016-04-06 ENCOUNTER — Telehealth: Payer: Self-pay | Admitting: *Deleted

## 2016-04-06 DIAGNOSIS — Z510 Encounter for antineoplastic radiation therapy: Secondary | ICD-10-CM | POA: Diagnosis not present

## 2016-04-06 MED ORDER — ZOLPIDEM TARTRATE 5 MG PO TABS: 5.0000 mg | ORAL_TABLET | Freq: Every evening | ORAL | 0 refills | Status: DC | PRN

## 2016-04-06 NOTE — Telephone Encounter (Signed)
This RN spoke with pt per her call stating since starting radiation and xeloda she has not been sleeping well despite use of lunesta.  Per discussion pt states she has tried [place] in the past and would like to try it again.  This RN discussed concerns relating to [place] - with pt verbalizing understanding as well safety in her home with her husband and son who are present .  No other needs at this time.  Prescription called to verified pharmacy.

## 2016-04-09 ENCOUNTER — Telehealth: Payer: Self-pay | Admitting: *Deleted

## 2016-04-09 ENCOUNTER — Ambulatory Visit
Admission: RE | Admit: 2016-04-09 | Discharge: 2016-04-09 | Disposition: A | Discharge status: Home / Self Care | Payer: 59 | Source: Ambulatory Visit | Attending: Radiation Oncology | Admitting: Radiation Oncology

## 2016-04-09 VITALS — BP 128/64 | HR 70 | Temp 98.4°F | Ht 63.0 in | Wt 153.6 lb

## 2016-04-09 DIAGNOSIS — Z510 Encounter for antineoplastic radiation therapy: Secondary | ICD-10-CM | POA: Diagnosis not present

## 2016-04-09 DIAGNOSIS — C50412 Malignant neoplasm of upper-outer quadrant of left female breast: Secondary | ICD-10-CM | POA: Insufficient documentation

## 2016-04-09 MED ORDER — RADIAPLEXRX EX GEL
Freq: Once | CUTANEOUS | Status: AC
  Administered 2016-04-09: 17:00:00 via TOPICAL

## 2016-04-09 NOTE — Progress Notes (Signed)
Alison [name] presents for her 6th fraction of radiation to her Left Chest wall, SCLV, PAB. She denies pain, but does take two oxycodone before treatment each day to help tolerate her treatment. She is also takes oxycontin for chronic pain after lymph nodes were removed. She does report some fatigue, and will rest when she is able to. Her Left Chest wall is red and tender. She is using the radiaplex twice daily and has increased her usage due to feeling tight over her Radiation site.   BP 128/64   Pulse 70   Temp 98.4 F (36.9 C)   Ht 5\' 3"  (1.6 m)   Wt 153 lb 9.6 oz (69.7 kg)   BMI 27.21 kg/m

## 2016-04-09 NOTE — Telephone Encounter (Signed)
  Oncology Nurse Navigator Documentation  Navigator Location: [facility] Onc (04/09/16 1500) Navigator Encounter Type: Telephone (04/09/16 1500) Telephone: Outgoing Call (04/09/16 1500)         Patient Visit Type: RadOnc (04/09/16 1500) Treatment Phase: First Radiation Tx (04/09/16 1500)                            Time Spent with Patient: 15 (04/09/16 1500)

## 2016-04-09 NOTE — Progress Notes (Signed)
   Weekly Management Note:  outpatient    ICD-9-CM ICD-10-CM   1. Breast cancer of upper-outer quadrant of left female breast (HCC) 174.4 C50.412 hyaluronate sodium (RADIAPLEXRX) gel    Current Dose:  10.8 Gy  Projected Dose: 60.4 Gy   Narrative:  The patient presents for routine under treatment assessment.  CBCT/MVCT images/Port film x-rays were reviewed.  The chart was checked. No new complaints. PT is helping her symptoms.  Physical Findings:  height is 5\' 3"  (1.6 m) and weight is 153 lb 9.6 oz (69.7 kg). Her temperature is 98.4 F (36.9 C). Her blood pressure is 128/64 and her pulse is 70.   Wt Readings from Last 3 Encounters:  04/09/16 153 lb 9.6 oz (69.7 kg)  04/03/16 153 lb 6.4 oz (69.6 kg)  04/02/16 153 lb 8 oz (69.6 kg)   NAD, no skin irritation in RT fields  Impression:  The patient is tolerating radiotherapy.  Plan:  Continue radiotherapy as planned.    ________________________________   Eppie Gibson, M.D.

## 2016-04-10 ENCOUNTER — Encounter: Payer: Self-pay | Admitting: Physical Therapy

## 2016-04-10 ENCOUNTER — Ambulatory Visit: Payer: 59 | Admitting: Physical Therapy

## 2016-04-10 ENCOUNTER — Ambulatory Visit
Admission: RE | Admit: 2016-04-10 | Discharge: 2016-04-10 | Disposition: A | Discharge status: Home / Self Care | Payer: 59 | Source: Ambulatory Visit | Attending: Radiation Oncology | Admitting: Radiation Oncology

## 2016-04-10 DIAGNOSIS — R221 Localized swelling, mass and lump, neck: Secondary | ICD-10-CM | POA: Diagnosis not present

## 2016-04-10 DIAGNOSIS — M25512 Pain in left shoulder: Secondary | ICD-10-CM

## 2016-04-10 DIAGNOSIS — M25612 Stiffness of left shoulder, not elsewhere classified: Secondary | ICD-10-CM

## 2016-04-10 DIAGNOSIS — Z510 Encounter for antineoplastic radiation therapy: Secondary | ICD-10-CM | POA: Diagnosis not present

## 2016-04-10 NOTE — Therapy (Signed)
[place], [place], [address] Phone: [phone]   Fax:  [phone]  Physical Therapy Treatment  Patient Details  Name: Alison [name] MRN: [id number] Date of Birth: [birth year] Referring Provider: Dr.Byerly   Encounter Date: 04/10/2016      PT End of Session - 04/10/16 1607    Visit Number 8   Number of Visits 9   Date for PT Re-Evaluation 04/26/16   PT Start Time 1510   PT Stop Time 1605   PT Time Calculation (min) 55 min   Activity Tolerance Patient tolerated treatment well   Behavior During Therapy [facility] for tasks assessed/performed      Past Medical History:  Diagnosis Date  . Asthma     seasonal asthma  . Breast cancer ([place])   . Complication of anesthesia 2007   aspiration pneumonia post hysterectomy.  Blood pressure would drop low- but no problems 01/2016- surgery  . History of blood transfusion   . Hypertension   . Neuropathy ([place])    with chemo  . Osteopenia determined by x-ray   . Pneumonia    1980's and 1990's  . Raynaud's disease   . Rectovaginal fistula    repaired 2014  . Skin cancer 2014   basal- back    Past Surgical History:  Procedure Laterality Date  . ABDOMINAL HYSTERECTOMY  2007   with BSO  . APPENDECTOMY  2007  . AXILLARY LYMPH NODE DISSECTION Left 02/14/2016  . AXILLARY LYMPH NODE DISSECTION Left 02/14/2016   Procedure: LEFT AXILLARY LYMPH NODE DISSECTION;  Surgeon: [name], MD;  Location: [place];  Service: General;  Laterality: Left;  . BREAST RECONSTRUCTION WITH PLACEMENT OF TISSUE EXPANDER AND FLEX HD (ACELLULAR HYDRATED DERMIS) Left 02/02/2016   Procedure: IMMEDIATE LEFT BREAST RECONSTRUCTION WITH PLACEMENT OF TISSUE EXPANDER AND FLEX HD (ACELLULAR HYDRATED DERMIS);  Surgeon: [name], DO;  Location: [place];  Service: Plastics;  Laterality: Left;  . BREAST SURGERY Left 02/02/16   mastectomy with reconstruction  . c-section    .  COLON RESECTION  4/'2014  . HERNIA REPAIR    . Port- a- cath placement  07/2015  . PORT-A-CATH REMOVAL Right 02/02/2016   Procedure: REMOVAL PORT-A-CATH;  Surgeon: [name], MD;  Location: [place];  Service: General;  Laterality: Right;  . RADIOACTIVE SEED GUIDED AXILLARY SENTINEL LYMPH NODE Left 02/02/2016   Procedure: LEFT BREAST MASTECTOMY AND RADIOACTIVE SEED GUIDED LEFT SENTINEL LYMPH NODE BIOPSY ;  Surgeon: [name], MD;  Location: [place];  Service: General;  Laterality: Left;    There were no vitals filed for this visit.      Subjective Assessment - 04/10/16 1513    Subjective I am not sure I am wearing the chip bag properly. The tape really helped.    Pertinent History Breast cancer diagnosed November2016 She had chemotherapy. but did have some neuropathy so it had to be stopped. On June 8, she had a mastectomy with immedicate expander placement and with first set of lymphnodes removed, On  June 20  she went for complete axillary lymph node dissection. She says she has had always been "full necked" but has neck and upper body fullness since the cancer was diagnosed.    Patient Stated Goals to help with edema    Currently in Pain? No/denies   Pain Score 0-No pain  [facility] Adult PT Treatment/Exercise - 04/10/16 0001      Lumbar Exercises: Aerobic   Stationary Bike 6 minutes     Shoulder Exercises: Pulleys   Flexion 2 minutes     Manual Therapy   Manual Lymphatic Drainage (MLD) In supine, short neck and bilateal shoulder collectors, anterior thoat toward sides, right axillary nodes, anterioer interaxillary anastamosis, superficial and deep abdominals. left inguinals and axillo-inguinal anastamosis, bilateral supraclavicular area toward posterior cervical nodes. then to right sidelying for posterior interaxillary anastamosis, upper back toward lower back.      Kinesiotix   Edema skinkote. kinesiotape  in fan shape from left shouler and upper  back toward left inguinals.                         [facility] Goals - 04/05/16 1824      CC Long Term Goal  #1   Title Patient will report a decrease in pain by 50% so they can perform daily activities with greater ease   Status On-going     CC Long Term Goal  #2   Title Patient will improve shoulder flexion range of motion to 150 degrees to perform activities of daily living and household chores with greater ease   Status Achieved     CC Long Term Goal  #3   Title Patient with verbalize an understanding of lymphedema risk reduction precautions   Status On-going     CC Long Term Goal  #4   Title Patient will be independent in self manual lymph drainage     CC Long Term Goal  #5   Status On-going            Plan - 04/10/16 1607    Clinical Impression Statement Patient saw a lot of improvement in discomfort with kinesiotape in place. Tape was reapplied this visit. Patient still with considerable swelling in anterior neck and upper back. MLD performed today to these areas prior to taping.    Rehab Potential Good   Clinical Impairments Affecting Rehab Potential chemotherapy with neuropathy    PT Frequency 2x / week   PT Duration 3 weeks   PT Treatment/Interventions ADLs/Self Care Home Management;Manual lymph drainage;Compression bandaging;Scar mobilization;Passive range of motion;Patient/family education;Taping;Manual techniques;Therapeutic exercise;Therapeutic activities;Orthotic Fit/Training;DME Instruction   PT Next Visit Plan Continue with bike and pulleys for lymphatic stimulation and shoulder stretch continue with manual lymph drainage with extra time on supraclavicular area toward posterior cervical. Assess chip pack use if pt brings it in.   PT Home Exercise Plan dowel and/or wall walking for left shoulder ROM   Consulted and Agree with Plan of Care Patient      Patient will benefit from skilled  therapeutic intervention in order to improve the following deficits and impairments:  Decreased range of motion, Impaired UE functional use, Decreased activity tolerance, Decreased knowledge of precautions, Decreased skin integrity, Impaired perceived functional ability, Pain, Decreased knowledge of use of DME, Increased edema, Postural dysfunction, Decreased strength, Impaired sensation, Decreased scar mobility  Visit Diagnosis: Localized swelling, mass and lump, neck  Stiffness of left shoulder joint  Pain in left shoulder     Problem List Patient Active Problem List   Diagnosis Date Noted  . Primary malignant neoplasm of left breast with stage 2 nodal metastasis per American Joint Committee on Cancer 7th edition (N2) ([place]) 02/14/2016  . Breast cancer ([place]) 02/02/2016  . 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 Insomnia 09/01/2015  .  Breast cancer of upper-outer quadrant of left female breast ([place]) 08/08/2015    Alexia Freestone 04/10/2016, 4:09 PM  [place], [place], [address] Phone: [phone]   Fax:  [phone]  Name: Alison [name] MRN: [id number] Date of Birth: 10-28-1960

## 2016-04-11 ENCOUNTER — Ambulatory Visit
Admission: RE | Admit: 2016-04-11 | Discharge: 2016-04-11 | Disposition: A | Discharge status: Home / Self Care | Payer: 59 | Source: Ambulatory Visit | Attending: Radiation Oncology | Admitting: Radiation Oncology

## 2016-04-11 DIAGNOSIS — Z510 Encounter for antineoplastic radiation therapy: Secondary | ICD-10-CM | POA: Diagnosis not present

## 2016-04-12 ENCOUNTER — Ambulatory Visit: Payer: 59 | Admitting: Physical Therapy

## 2016-04-12 ENCOUNTER — Ambulatory Visit
Admission: RE | Admit: 2016-04-12 | Discharge: 2016-04-12 | Disposition: A | Discharge status: Home / Self Care | Payer: 59 | Source: Ambulatory Visit | Attending: Radiation Oncology | Admitting: Radiation Oncology

## 2016-04-12 ENCOUNTER — Encounter: Payer: Self-pay | Admitting: Physical Therapy

## 2016-04-12 DIAGNOSIS — R221 Localized swelling, mass and lump, neck: Secondary | ICD-10-CM

## 2016-04-12 DIAGNOSIS — Z510 Encounter for antineoplastic radiation therapy: Secondary | ICD-10-CM | POA: Diagnosis not present

## 2016-04-12 NOTE — Therapy (Signed)
[place], [place], [address] Phone: [phone]   Fax:  [phone]  Physical Therapy Treatment  Patient Details  Name: Alison [name] MRN: [id number] Date of Birth: 04-29-1961 Referring Provider: Dr.Byerly   Encounter Date: 04/12/2016      PT End of Session - 04/12/16 1709    Visit Number 9   Number of Visits 9   Date for PT Re-Evaluation 04/26/16   PT Start Time 1519   PT Stop Time 1607   PT Time Calculation (min) 48 min   Activity Tolerance Patient tolerated treatment well   Behavior During Therapy [facility] for tasks assessed/performed      Past Medical History:  Diagnosis Date  . Asthma     seasonal asthma  . Breast cancer ([place])   . Complication of anesthesia 2007   aspiration pneumonia post hysterectomy.  Blood pressure would drop low- but no problems 01/2016- surgery  . History of blood transfusion   . Hypertension   . Neuropathy ([place])    with chemo  . Osteopenia determined by x-ray   . Pneumonia    1980's and 1990's  . Raynaud's disease   . Rectovaginal fistula    repaired 2014  . Skin cancer 2014   basal- back    Past Surgical History:  Procedure Laterality Date  . ABDOMINAL HYSTERECTOMY  2007   with BSO  . APPENDECTOMY  2007  . AXILLARY LYMPH NODE DISSECTION Left 02/14/2016  . AXILLARY LYMPH NODE DISSECTION Left 02/14/2016   Procedure: LEFT AXILLARY LYMPH NODE DISSECTION;  Surgeon: [name], MD;  Location: [place];  Service: General;  Laterality: Left;  . BREAST RECONSTRUCTION WITH PLACEMENT OF TISSUE EXPANDER AND FLEX HD (ACELLULAR HYDRATED DERMIS) Left 02/02/2016   Procedure: IMMEDIATE LEFT BREAST RECONSTRUCTION WITH PLACEMENT OF TISSUE EXPANDER AND FLEX HD (ACELLULAR HYDRATED DERMIS);  Surgeon: [name], DO;  Location: [place];  Service: Plastics;  Laterality: Left;  . BREAST SURGERY Left 02/02/16   mastectomy with reconstruction  . c-section    .  COLON RESECTION  4/'2014  . HERNIA REPAIR    . Port- a- cath placement  07/2015  . PORT-A-CATH REMOVAL Right 02/02/2016   Procedure: REMOVAL PORT-A-CATH;  Surgeon: [name], MD;  Location: [place];  Service: General;  Laterality: Right;  . RADIOACTIVE SEED GUIDED AXILLARY SENTINEL LYMPH NODE Left 02/02/2016   Procedure: LEFT BREAST MASTECTOMY AND RADIOACTIVE SEED GUIDED LEFT SENTINEL LYMPH NODE BIOPSY ;  Surgeon: [name], MD;  Location: [place];  Service: General;  Laterality: Left;    There were no vitals filed for this visit.      Subjective Assessment - 04/12/16 1521    Subjective I feel like the tape helped again. I didn't have that miserable feeling.    Pertinent History Breast cancer diagnosed November2016 She had chemotherapy. but did have some neuropathy so it had to be stopped. On June 8, she had a mastectomy with immedicate expander placement and with first set of lymphnodes removed, On  June 20  she went for complete axillary lymph node dissection. She says she has had always been "full necked" but has neck and upper body fullness since the cancer was diagnosed.    Patient Stated Goals to help with edema    Currently in Pain? No/denies   Pain Score 0-No pain  [facility] Adult PT Treatment/Exercise - 04/12/16 0001      Lumbar Exercises: Aerobic   Stationary Bike 6 minutes     Shoulder Exercises: Pulleys   Flexion 2 minutes   ABduction 2 minutes     Manual Therapy   Manual Lymphatic Drainage (MLD) In supine, short neck and bilateal shoulder collectors, anterior thoat toward sides, right axillary nodes, anterioer interaxillary anastamosis, superficial and deep abdominals. left inguinals and axillo-inguinal anastamosis, bilateral supraclavicular area toward posterior cervical nodes. then to right sidelying for posterior interaxillary anastamosis, upper back toward lower back.      Kinesiotix   Edema  skinkote. kinesiotape in fan shape from left shouler and upper  back toward left inguinals.                         [facility] Goals - 04/05/16 1824      CC Long Term Goal  #1   Title Patient will report a decrease in pain by 50% so they can perform daily activities with greater ease   Status On-going     CC Long Term Goal  #2   Title Patient will improve shoulder flexion range of motion to 150 degrees to perform activities of daily living and household chores with greater ease   Status Achieved     CC Long Term Goal  #3   Title Patient with verbalize an understanding of lymphedema risk reduction precautions   Status On-going     CC Long Term Goal  #4   Title Patient will be independent in self manual lymph drainage     CC Long Term Goal  #5   Status On-going            Plan - 04/12/16 1709    Clinical Impression Statement Pt continues to see improvement with use of kinesiotape. Tape was reapplied this visit. No redness or irritation noted. Educated pt about FlexiTouch head and neck garment and will send facesheet to check insurance benefits to see if this might help her edema. Pt brought it chip bag and showed therapist how she wears it. It was correct and pt was encouraged to wear it more often.    Rehab Potential Good   Clinical Impairments Affecting Rehab Potential chemotherapy with neuropathy    PT Frequency 2x / week   PT Duration 3 weeks   PT Treatment/Interventions ADLs/Self Care Home Management;Manual lymph drainage;Compression bandaging;Scar mobilization;Passive range of motion;Patient/family education;Taping;Manual techniques;Therapeutic exercise;Therapeutic activities;Orthotic Fit/Training;DME Instruction   PT Next Visit Plan assess goals, continue MLD   PT Home Exercise Plan dowel and/or wall walking for left shoulder ROM   Recommended Other Services facesheet sent to flexitouch   Consulted and Agree with Plan of Care Patient      Patient  will benefit from skilled therapeutic intervention in order to improve the following deficits and impairments:  Decreased range of motion, Impaired UE functional use, Decreased activity tolerance, Decreased knowledge of precautions, Decreased skin integrity, Impaired perceived functional ability, Pain, Decreased knowledge of use of DME, Increased edema, Postural dysfunction, Decreased strength, Impaired sensation, Decreased scar mobility  Visit Diagnosis: Localized swelling, mass and lump, neck     Problem List Patient Active Problem List   Diagnosis Date Noted  . Primary malignant neoplasm of left breast with stage 2 nodal metastasis per American Joint Committee on Cancer 7th edition (N2) ([place]) 02/14/2016  . Breast cancer ([place]) 02/02/2016  . Chemotherapy-induced neuropathy ([place]) 12/30/2015  . Insomnia  09/01/2015  . Breast cancer of upper-outer quadrant of left female breast ([place]) 08/08/2015    Alexia Freestone 04/12/2016, 5:14 PM  [place], [place], [address] Phone: [phone]   Fax:  [phone]  Name: Alison [name] MRN: [id number] Date of Birth: Sep 04, 1960   Allyson Sabal, PT 04/12/16 5:14 PM

## 2016-04-13 ENCOUNTER — Ambulatory Visit
Admission: RE | Admit: 2016-04-13 | Discharge: 2016-04-13 | Disposition: A | Discharge status: Home / Self Care | Payer: 59 | Source: Ambulatory Visit | Attending: Radiation Oncology | Admitting: Radiation Oncology

## 2016-04-13 DIAGNOSIS — Z510 Encounter for antineoplastic radiation therapy: Secondary | ICD-10-CM | POA: Diagnosis not present

## 2016-04-16 ENCOUNTER — Encounter: Payer: Self-pay | Admitting: Radiation Oncology

## 2016-04-16 ENCOUNTER — Ambulatory Visit
Admission: RE | Admit: 2016-04-16 | Discharge: 2016-04-16 | Disposition: A | Discharge status: Home / Self Care | Payer: 59 | Source: Ambulatory Visit | Attending: Radiation Oncology | Admitting: Radiation Oncology

## 2016-04-16 VITALS — BP 103/56 | HR 75 | Temp 98.4°F | Ht 63.0 in | Wt 150.5 lb

## 2016-04-16 DIAGNOSIS — Z510 Encounter for antineoplastic radiation therapy: Secondary | ICD-10-CM | POA: Diagnosis not present

## 2016-04-16 DIAGNOSIS — C50412 Malignant neoplasm of upper-outer quadrant of left female breast: Secondary | ICD-10-CM

## 2016-04-16 NOTE — Progress Notes (Signed)
Alison [name] is here for her 11th fraction of radiation to her Left Chest wall and Sclv/PAB. She denies pain. She does report some fatigue. She tried to stay active to help with her fatigue. She is able to do her normal activities. She relates some of her fatigue to her oral chemotherapy pill, Xeloda. Her Left Breast is red, especially to the scar area. She also has an area of itching to the upper inner area of her Left Breast which also itches. She will try hydrocotisone cream which she has at home. She is using the Radiaplex as directed twice daily.  BP (!) 103/56   Pulse 75   Temp 98.4 F (36.9 C)   Ht 5\' 3"  (1.6 m)   Wt 150 lb 8 oz (68.3 kg)   BMI 26.66 kg/m

## 2016-04-16 NOTE — Progress Notes (Signed)
   Weekly Management Note:  outpatient    ICD-9-CM ICD-10-CM   1. Breast cancer of upper-outer quadrant of left female breast (HCC) 174.4 C50.412     Current Dose:  19.8 Gy  Projected Dose: 60.4 Gy   Narrative:  The patient presents for routine under treatment assessment.  CBCT/MVCT images/Port film x-rays were reviewed.  The chart was checked. No new complaints other than mild itching in UIQ of left chest and some fatigue  Physical Findings:  height is 5\' 3"  (1.6 m) and weight is 150 lb 8 oz (68.3 kg). Her temperature is 98.4 F (36.9 C). Her blood pressure is 103/56 (abnormal) and her pulse is 75.   Wt Readings from Last 3 Encounters:  04/16/16 150 lb 8 oz (68.3 kg)  04/09/16 153 lb 9.6 oz (69.7 kg)  04/03/16 153 lb 6.4 oz (69.6 kg)   NAD, mild dermatitis of UIQ of left chest  Impression:  The patient is tolerating radiotherapy.  Plan:  Continue radiotherapy as planned.   hydrocortisone 1% cream prn itching ________________________________   Eppie Gibson, M.D.

## 2016-04-17 ENCOUNTER — Other Ambulatory Visit (HOSPITAL_BASED_OUTPATIENT_CLINIC_OR_DEPARTMENT_OTHER): Payer: 59

## 2016-04-17 ENCOUNTER — Ambulatory Visit
Admission: RE | Admit: 2016-04-17 | Discharge: 2016-04-17 | Disposition: A | Discharge status: Home / Self Care | Payer: 59 | Source: Ambulatory Visit | Attending: Radiation Oncology | Admitting: Radiation Oncology

## 2016-04-17 ENCOUNTER — Ambulatory Visit: Payer: 59 | Admitting: Physical Therapy

## 2016-04-17 ENCOUNTER — Other Ambulatory Visit: Payer: Self-pay | Admitting: *Deleted

## 2016-04-17 DIAGNOSIS — M25512 Pain in left shoulder: Secondary | ICD-10-CM

## 2016-04-17 DIAGNOSIS — R221 Localized swelling, mass and lump, neck: Secondary | ICD-10-CM | POA: Diagnosis not present

## 2016-04-17 DIAGNOSIS — Z510 Encounter for antineoplastic radiation therapy: Secondary | ICD-10-CM | POA: Diagnosis not present

## 2016-04-17 DIAGNOSIS — C50412 Malignant neoplasm of upper-outer quadrant of left female breast: Secondary | ICD-10-CM

## 2016-04-17 DIAGNOSIS — M25612 Stiffness of left shoulder, not elsewhere classified: Secondary | ICD-10-CM

## 2016-04-17 DIAGNOSIS — R222 Localized swelling, mass and lump, trunk: Secondary | ICD-10-CM

## 2016-04-17 LAB — COMPREHENSIVE METABOLIC PANEL
ALT: 19 U/L (ref 0–55)
ANION GAP: 9 meq/L (ref 3–11)
AST: 23 U/L (ref 5–34)
Albumin: 4 g/dL (ref 3.5–5.0)
Alkaline Phosphatase: 47 U/L (ref 40–150)
BUN: 20.6 mg/dL (ref 7.0–26.0)
CHLORIDE: 106 meq/L (ref 98–109)
CO2: 22 meq/L (ref 22–29)
CREATININE: 1.1 mg/dL (ref 0.6–1.1)
Calcium: 10.3 mg/dL (ref 8.4–10.4)
EGFR: 57 mL/min/{1.73_m2} — AB (ref >90)
GLUCOSE: 95 mg/dL (ref 70–140)
Potassium: 4.3 mEq/L (ref 3.5–5.1)
SODIUM: 137 meq/L (ref 136–145)
Total Bilirubin: <0.3 mg/dL (ref 0.20–1.20)
Total Protein: 7.4 g/dL (ref 6.4–8.3)

## 2016-04-17 LAB — CBC WITH DIFFERENTIAL/PLATELET
BASO%: 1.2 % (ref 0.0–2.0)
Basophils Absolute: 0.1 10*3/uL (ref 0.0–0.1)
EOS%: 3.6 % (ref 0.0–7.0)
Eosinophils Absolute: 0.2 10*3/uL (ref 0.0–0.5)
HCT: 37.6 % (ref 34.8–46.6)
HGB: 12.4 g/dL (ref 11.6–15.9)
LYMPH%: 20.5 % (ref 14.0–49.7)
MCH: 29.7 pg (ref 25.1–34.0)
MCHC: 33 g/dL (ref 31.5–36.0)
MCV: 89.9 fL (ref 79.5–101.0)
MONO#: 0.6 10*3/uL (ref 0.1–0.9)
MONO%: 11.3 % (ref 0.0–14.0)
NEUT%: 63.4 % (ref 38.4–76.8)
NEUTROS ABS: 3.6 10*3/uL (ref 1.5–6.5)
PLATELETS: 375 10*3/uL (ref 145–400)
RBC: 4.18 10*6/uL (ref 3.70–5.45)
RDW: 15 % — ABNORMAL HIGH (ref 11.2–14.5)
WBC: 5.6 10*3/uL (ref 3.9–10.3)
lymph#: 1.2 10*3/uL (ref 0.9–3.3)

## 2016-04-17 NOTE — Therapy (Signed)
[place], [place], [address] Phone: [phone]   Fax:  [phone]  Physical Therapy Treatment  Patient Details  Name: Alison [name] MRN: [id number] Date of Birth: 1961/05/29 Referring Provider: Dr.Byerly   Encounter Date: 04/17/2016      PT End of Session - 04/17/16 1618    Visit Number 10   Number of Visits 21   Date for PT Re-Evaluation 05/29/16   PT Start Time 1519   PT Stop Time 1613   PT Time Calculation (min) 54 min   Activity Tolerance Patient tolerated treatment well   Behavior During Therapy [facility] for tasks assessed/performed      Past Medical History:  Diagnosis Date  . Asthma     seasonal asthma  . Breast cancer ([place])   . Complication of anesthesia 2007   aspiration pneumonia post hysterectomy.  Blood pressure would drop low- but no problems 01/2016- surgery  . History of blood transfusion   . Hypertension   . Neuropathy ([place])    with chemo  . Osteopenia determined by x-ray   . Pneumonia    1980's and 1990's  . Raynaud's disease   . Rectovaginal fistula    repaired 2014  . Skin cancer 2014   basal- back    Past Surgical History:  Procedure Laterality Date  . ABDOMINAL HYSTERECTOMY  2007   with BSO  . APPENDECTOMY  2007  . AXILLARY LYMPH NODE DISSECTION Left 02/14/2016  . AXILLARY LYMPH NODE DISSECTION Left 02/14/2016   Procedure: LEFT AXILLARY LYMPH NODE DISSECTION;  Surgeon: [name], MD;  Location: [place];  Service: General;  Laterality: Left;  . BREAST RECONSTRUCTION WITH PLACEMENT OF TISSUE EXPANDER AND FLEX HD (ACELLULAR HYDRATED DERMIS) Left 02/02/2016   Procedure: IMMEDIATE LEFT BREAST RECONSTRUCTION WITH PLACEMENT OF TISSUE EXPANDER AND FLEX HD (ACELLULAR HYDRATED DERMIS);  Surgeon: [name], DO;  Location: [place];  Service: Plastics;  Laterality: Left;  . BREAST SURGERY Left 02/02/16   mastectomy with reconstruction  . c-section     . COLON RESECTION  4/'2014  . HERNIA REPAIR    . Port- a- cath placement  07/2015  . PORT-A-CATH REMOVAL Right 02/02/2016   Procedure: REMOVAL PORT-A-CATH;  Surgeon: [name], MD;  Location: [place];  Service: General;  Laterality: Right;  . RADIOACTIVE SEED GUIDED AXILLARY SENTINEL LYMPH NODE Left 02/02/2016   Procedure: LEFT BREAST MASTECTOMY AND RADIOACTIVE SEED GUIDED LEFT SENTINEL LYMPH NODE BIOPSY ;  Surgeon: [name], MD;  Location: [place];  Service: General;  Laterality: Left;    There were no vitals filed for this visit.      Subjective Assessment - 04/17/16 1526    Subjective "The tape really helped a lot, I still have the tape on from Thursday. I could tell a difference over the weekend."   Pertinent History Breast cancer diagnosed November2016 She had chemotherapy. but did have some neuropathy so it had to be stopped. On June 8, she had a mastectomy with immedicate expander placement and with first set of lymphnodes removed, On  June 20  she went for complete axillary lymph node dissection. She says she has had always been "full necked" but has neck and upper body fullness since the cancer was diagnosed.    Patient Stated Goals to help with edema    Currently in Pain? No/denies  LYMPHEDEMA/ONCOLOGY QUESTIONNAIRE - 04/17/16 1535      Head and Neck   4 cm superior to sternal notch around neck 51 cm   6 cm superior to sternal notch around neck 50.9 cm   8 cm superior to sternal notch around neck 50.9 cm                  [facility] Adult PT Treatment/Exercise - 04/17/16 0001      Self-Care   Self-Care Other Self-Care Comments   Other Self-Care Comments  Patient educted on importance of doing exercise at home, as well as strategies to begin exercising at home, to combat fatigue and to stimulate lymphatic system.     Lumbar Exercises: Aerobic   Stationary Bike 7 minutes     Shoulder Exercises: Pulleys    Flexion 2 minutes   ABduction 2 minutes     Manual Therapy   Manual Lymphatic Drainage (MLD) In supine, short neck and bilateal shoulder collectors, anterior thoat toward sides, right axillary nodes, anterioer interaxillary anastamosis, superficial and deep abdominals. left inguinals and axillo-inguinal anastamosis, bilateral supraclavicular area toward posterior cervical nodes. then to right sidelying for posterior interaxillary anastamosis, upper back toward lower back.      Kinesiotix   Edema skinkote. kinesiotape in fan shape from left shouler and upper  back toward left inguinals.                 PT Education - 04/17/16 1629    Education provided Yes   Education Details importance of exercise and types of exercises to perform at home   Person(s) Educated Patient   Methods Explanation   Comprehension Verbalized understanding                [facility] Goals - 04/17/16 1613      CC Long Term Goal  #1   Title Patient will report a decrease in pain by 50% so they can perform daily activities with greater ease   Baseline 100% improvement in pain   Time 4   Period Weeks   Status Achieved     CC Long Term Goal  #2   Title Patient will improve shoulder flexion range of motion to 150 degrees to perform activities of daily living and household chores with greater ease   Baseline 158 on 03/20/2016   Time 4   Period Weeks   Status Achieved     CC Long Term Goal  #3   Title Patient with verbalize an understanding of lymphedema risk reduction precautions   Time 4   Period Weeks   Status On-going     CC Long Term Goal  #4   Title Patient will be independent in self manual lymph drainage   Time 4   Period Weeks   Status On-going     CC Long Term Goal  #5   Title Pt will report her edema symptoms in upper body are about 25% better    Baseline feels 80% better on 04/17/16   Time 4   Period Weeks   Status Achieved            Plan - 04/17/16 1618     Clinical Impression Statement Patient continues to report improvement in edema and pain and wishes to continue with therapy at this time, with patient reporting 100% improvement in her pain and showing improvement in neck circumference measurements. Patient was given another chip pack to address the edema in her neck and shoulders.  Kinesiotape was reapplied this visit.   Rehab Potential Good   Clinical Impairments Affecting Rehab Potential chemotherapy with neuropathy    PT Frequency 2x / week   PT Duration 3 weeks   PT Treatment/Interventions ADLs/Self Care Home Management;Manual lymph drainage;Compression bandaging;Scar mobilization;Passive range of motion;Patient/family education;Taping;Manual techniques;Therapeutic exercise;Therapeutic activities;Orthotic Fit/Training;DME Instruction   PT Next Visit Plan continue manual lymph drainage, kinesiotape, stationary bike   PT Home Exercise Plan dowel and/or wall walking for left shoulder ROM   Consulted and Agree with Plan of Care Patient      Patient will benefit from skilled therapeutic intervention in order to improve the following deficits and impairments:  Decreased range of motion, Impaired UE functional use, Decreased activity tolerance, Decreased knowledge of precautions, Decreased skin integrity, Impaired perceived functional ability, Pain, Decreased knowledge of use of DME, Increased edema, Postural dysfunction, Decreased strength, Impaired sensation, Decreased scar mobility  Visit Diagnosis: Localized swelling, mass and lump, neck  Stiffness of left shoulder joint  Pain in left shoulder  Localized swelling, mass and lump, trunk     Problem List Patient Active Problem List   Diagnosis Date Noted  . Primary malignant neoplasm of left breast with stage 2 nodal metastasis per American Joint Committee on Cancer 7th edition (N2) ([place]) 02/14/2016  . Breast cancer ([place]) 02/02/2016  . 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  Insomnia 09/01/2015  . Breast cancer of upper-outer quadrant of left female breast ([place]) 08/08/2015    Mellody Life, SPT 04/17/2016, 4:30 PM  [place], [place], [address] Phone: [phone]   Fax:  [phone]  Name: Alison [name] MRN: [id number] Date of Birth: 21-Apr-1961

## 2016-04-18 ENCOUNTER — Ambulatory Visit
Admission: RE | Admit: 2016-04-18 | Discharge: 2016-04-18 | Disposition: A | Discharge status: Home / Self Care | Payer: 59 | Source: Ambulatory Visit | Attending: Radiation Oncology | Admitting: Radiation Oncology

## 2016-04-18 DIAGNOSIS — Z510 Encounter for antineoplastic radiation therapy: Secondary | ICD-10-CM | POA: Diagnosis not present

## 2016-04-19 ENCOUNTER — Ambulatory Visit
Admission: RE | Admit: 2016-04-19 | Discharge: 2016-04-19 | Disposition: A | Discharge status: Home / Self Care | Payer: 59 | Source: Ambulatory Visit | Attending: Radiation Oncology | Admitting: Radiation Oncology

## 2016-04-19 ENCOUNTER — Ambulatory Visit: Payer: 59 | Admitting: Physical Therapy

## 2016-04-19 DIAGNOSIS — R221 Localized swelling, mass and lump, neck: Secondary | ICD-10-CM

## 2016-04-19 DIAGNOSIS — Z510 Encounter for antineoplastic radiation therapy: Secondary | ICD-10-CM | POA: Diagnosis not present

## 2016-04-19 DIAGNOSIS — M25512 Pain in left shoulder: Secondary | ICD-10-CM

## 2016-04-19 DIAGNOSIS — M25612 Stiffness of left shoulder, not elsewhere classified: Secondary | ICD-10-CM

## 2016-04-19 NOTE — Therapy (Signed)
[place], [place], [address] Phone: [phone]   Fax:  [phone]  Physical Therapy Treatment  Patient Details  Name: Alison [name] MRN: [id number] Date of Birth: October 05, 1960 Referring Provider: Dr.Byerly   Encounter Date: 04/19/2016      PT End of Session - 04/19/16 1743    Visit Number 11   Number of Visits 21   Date for PT Re-Evaluation 05/29/16   PT Start Time [id number]   PT Stop Time 1602   PT Time Calculation (min) 47 min   Activity Tolerance Patient tolerated treatment well   Behavior During Therapy [facility] for tasks assessed/performed      Past Medical History:  Diagnosis Date  . Asthma     seasonal asthma  . Breast cancer ([place])   . Complication of anesthesia 2007   aspiration pneumonia post hysterectomy.  Blood pressure would drop low- but no problems 01/2016- surgery  . History of blood transfusion   . Hypertension   . Neuropathy ([place])    with chemo  . Osteopenia determined by x-ray   . Pneumonia    1980's and 1990's  . Raynaud's disease   . Rectovaginal fistula    repaired 2014  . Skin cancer 2014   basal- back    Past Surgical History:  Procedure Laterality Date  . ABDOMINAL HYSTERECTOMY  2007   with BSO  . APPENDECTOMY  2007  . AXILLARY LYMPH NODE DISSECTION Left 02/14/2016  . AXILLARY LYMPH NODE DISSECTION Left 02/14/2016   Procedure: LEFT AXILLARY LYMPH NODE DISSECTION;  Surgeon: [name], MD;  Location: [facility];  Service: General;  Laterality: Left;  . BREAST RECONSTRUCTION WITH PLACEMENT OF TISSUE EXPANDER AND FLEX HD (ACELLULAR HYDRATED DERMIS) Left 02/02/2016   Procedure: IMMEDIATE LEFT BREAST RECONSTRUCTION WITH PLACEMENT OF TISSUE EXPANDER AND FLEX HD (ACELLULAR HYDRATED DERMIS);  Surgeon: [name], DO;  Location: [facility];  Service: Plastics;  Laterality: Left;  . BREAST SURGERY Left 02/02/16   mastectomy with reconstruction  . c-section     . COLON RESECTION  4/'2014  . HERNIA REPAIR    . Port- a- cath placement  07/2015  . PORT-A-CATH REMOVAL Right 02/02/2016   Procedure: REMOVAL PORT-A-CATH;  Surgeon: [name], MD;  Location: [place];  Service: General;  Laterality: Right;  . RADIOACTIVE SEED GUIDED AXILLARY SENTINEL LYMPH NODE Left 02/02/2016   Procedure: LEFT BREAST MASTECTOMY AND RADIOACTIVE SEED GUIDED LEFT SENTINEL LYMPH NODE BIOPSY ;  Surgeon: [name], MD;  Location: [place];  Service: General;  Laterality: Left;    There were no vitals filed for this visit.      Subjective Assessment - 04/19/16 1738    Subjective Pt says that she is starting to get itchiness and redness in her skin from radiation.  She tried to do some exercis1e today on a twister board, but noticed she had more swelling in her abdomen afterward  she continues to get relief from the kinesiotape   Pertinent History Breast cancer diagnosed November2016 She had chemotherapy. but did have some neuropathy so it had to be stopped. On June 8, she had a mastectomy with immedicate expander placement and with first set of lymphnodes removed, On  June 20  she went for complete axillary lymph node dissection. She says she has had always been "full necked" but has neck and upper body fullness since the cancer was diagnosed.    Patient Stated Goals  to help with edema    Currently in Pain? Yes   Pain Score 3   had breakthrough pain today and had to take more meds   Pain Location Axilla   Pain Orientation Left                         [facility] Adult PT Treatment/Exercise - 04/19/16 0001      Lumbar Exercises: Aerobic   Stationary Bike 6 minutes     Shoulder Exercises: Pulleys   Flexion 2 minutes     Manual Therapy   Manual Lymphatic Drainage (MLD) In sitting and  supine, short neck and bilateal shoulder collectors, anterior thoat toward sides, right axillary nodes, anterioer interaxillary anastamosis,  superficial and deep abdominals. left inguinals and axillo-inguinal anastamosis, bilateral supraclavicular area toward posterior cervical nodes. then to right sidelying for posterior interaxillary anastamosis, upper back toward lower back.      Kinesiotix   Edema skinkote. kinesiotape in fan shape from left shouler and upper  back toward left inguinals.                         [facility] Goals - 04/17/16 1613      CC Long Term Goal  #1   Title Patient will report a decrease in pain by 50% so they can perform daily activities with greater ease   Baseline 100% improvement in pain   Time 4   Period Weeks   Status Achieved     CC Long Term Goal  #2   Title Patient will improve shoulder flexion range of motion to 150 degrees to perform activities of daily living and household chores with greater ease   Baseline 158 on 03/20/2016   Time 4   Period Weeks   Status Achieved     CC Long Term Goal  #3   Title Patient with verbalize an understanding of lymphedema risk reduction precautions   Time 4   Period Weeks   Status On-going     CC Long Term Goal  #4   Title Patient will be independent in self manual lymph drainage   Time 4   Period Weeks   Status On-going     CC Long Term Goal  #5   Title Pt will report her edema symptoms in upper body are about 25% better    Baseline feels 80% better on 04/17/16   Time 4   Period Weeks   Status Achieved            Plan - 04/19/16 1743    Clinical Impression Statement Pt appeared to have increased swelling in abdomen , neck, upper back and axilla today. Skin is getting red at chest and where bra is rubbing her.  Advised pt to stop doing the exercise board at home and to choose other clothing that does not rub her skin.  Will monitor skin closely  and proceed carefully.    Rehab Potential Good   Clinical Impairments Affecting Rehab Potential chemotherapy with neuropathy    PT Frequency 2x / week   PT Duration 6 weeks    PT Treatment/Interventions ADLs/Self Care Home Management;Manual lymph drainage;Compression bandaging;Scar mobilization;Passive range of motion;Patient/family education;Taping;Manual techniques;Therapeutic exercise;Therapeutic activities;Orthotic Fit/Training;DME Instruction   PT Next Visit Plan continue manual lymph drainage, kinesiotape, stationary bike as tolerated , hold off on pulleys to protect shoulder skin       Patient will benefit from skilled  therapeutic intervention in order to improve the following deficits and impairments:  Decreased range of motion, Impaired UE functional use, Decreased activity tolerance, Decreased knowledge of precautions, Decreased skin integrity, Impaired perceived functional ability, Pain, Decreased knowledge of use of DME, Increased edema, Postural dysfunction, Decreased strength, Impaired sensation, Decreased scar mobility  Visit Diagnosis: Localized swelling, mass and lump, neck  Stiffness of left shoulder joint  Pain in left shoulder     Problem List Patient Active Problem List   Diagnosis Date Noted  . Primary malignant neoplasm of left breast with stage 2 nodal metastasis per American Joint Committee on Cancer 7th edition (N2) ([place]) 02/14/2016  . Breast cancer ([place]) 02/02/2016  . 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 Insomnia 09/01/2015  . Breast cancer of upper-outer quadrant of left female breast ([place]) 08/08/2015   Donato Heinz. Owens Shark PT  [place] Levo 04/19/2016, 5:47 PM  [place], [place], [address] Phone: [phone]   Fax:  [phone]  Name: MARYLOUISE DUHR MRN: [id number] Date of Birth: [birth year]

## 2016-04-20 ENCOUNTER — Ambulatory Visit: Payer: 59 | Admitting: Physical Therapy

## 2016-04-20 ENCOUNTER — Ambulatory Visit
Admission: RE | Admit: 2016-04-20 | Discharge: 2016-04-20 | Disposition: A | Discharge status: Home / Self Care | Payer: 59 | Source: Ambulatory Visit | Attending: Radiation Oncology | Admitting: Radiation Oncology

## 2016-04-20 DIAGNOSIS — Z510 Encounter for antineoplastic radiation therapy: Secondary | ICD-10-CM | POA: Diagnosis not present

## 2016-04-20 DIAGNOSIS — R221 Localized swelling, mass and lump, neck: Secondary | ICD-10-CM | POA: Diagnosis not present

## 2016-04-20 DIAGNOSIS — M25612 Stiffness of left shoulder, not elsewhere classified: Secondary | ICD-10-CM

## 2016-04-20 DIAGNOSIS — M25512 Pain in left shoulder: Secondary | ICD-10-CM

## 2016-04-20 NOTE — Therapy (Signed)
[place], [place], [address] Phone: [phone]   Fax:  [phone]  Physical Therapy Treatment  Patient Details  Name: Alison [name] MRN: [id number] Date of Birth: February 20, 1961 Referring Provider: Dr.Byerly   Encounter Date: 04/20/2016      PT End of Session - 04/20/16 1438    Visit Number 12   Number of Visits 21   Date for PT Re-Evaluation 05/29/16   PT Start Time [id number]   PT Stop Time 1240   PT Time Calculation (min) 45 min   Behavior During Therapy [facility] for tasks assessed/performed      Past Medical History:  Diagnosis Date  . Asthma     seasonal asthma  . Breast cancer ([place])   . Complication of anesthesia 2007   aspiration pneumonia post hysterectomy.  Blood pressure would drop low- but no problems 01/2016- surgery  . History of blood transfusion   . Hypertension   . Neuropathy ([place])    with chemo  . Osteopenia determined by x-ray   . Pneumonia    1980's and 1990's  . Raynaud's disease   . Rectovaginal fistula    repaired 2014  . Skin cancer 2014   basal- back    Past Surgical History:  Procedure Laterality Date  . ABDOMINAL HYSTERECTOMY  2007   with BSO  . APPENDECTOMY  2007  . AXILLARY LYMPH NODE DISSECTION Left 02/14/2016  . AXILLARY LYMPH NODE DISSECTION Left 02/14/2016   Procedure: LEFT AXILLARY LYMPH NODE DISSECTION;  Surgeon: [name], MD;  Location: [place];  Service: General;  Laterality: Left;  . BREAST RECONSTRUCTION WITH PLACEMENT OF TISSUE EXPANDER AND FLEX HD (ACELLULAR HYDRATED DERMIS) Left 02/02/2016   Procedure: IMMEDIATE LEFT BREAST RECONSTRUCTION WITH PLACEMENT OF TISSUE EXPANDER AND FLEX HD (ACELLULAR HYDRATED DERMIS);  Surgeon: [name], DO;  Location: [place];  Service: Plastics;  Laterality: Left;  . BREAST SURGERY Left 02/02/16   mastectomy with reconstruction  . c-section    . COLON RESECTION  4/'2014  . HERNIA REPAIR    .  Port- a- cath placement  07/2015  . PORT-A-CATH REMOVAL Right 02/02/2016   Procedure: REMOVAL PORT-A-CATH;  Surgeon: [name], MD;  Location: [place];  Service: General;  Laterality: Right;  . RADIOACTIVE SEED GUIDED AXILLARY SENTINEL LYMPH NODE Left 02/02/2016   Procedure: LEFT BREAST MASTECTOMY AND RADIOACTIVE SEED GUIDED LEFT SENTINEL LYMPH NODE BIOPSY ;  Surgeon: [name], MD;  Location: [place];  Service: General;  Laterality: Left;    There were no vitals filed for this visit.      Subjective Assessment - 04/20/16 1212    Subjective Woke up with numbness in left forearm and hand; also having some pain in right arm.  Left underarm and breast hurt.  Left forearm numbness is now better, but digits 4 and 5 are still numb.     Pertinent History She reports she has osteopenia in low back and has had a fracture that was discovered in 2014 when she had a Dexa scan.   Currently in Pain? Yes   Pain Score 5    Pain Location Axilla  and breast   Pain Orientation Left   Pain Descriptors / Indicators Sore   Aggravating Factors  flexing the pect muscle on the left   Pain Relieving Factors pain meds               LYMPHEDEMA/ONCOLOGY QUESTIONNAIRE -  04/20/16 1215      Left Upper Extremity Lymphedema   10 cm Proximal to Olecranon Process 26.5 cm   Olecranon Process 22.6 cm   15 cm Proximal to Ulnar Styloid Process 22.6 cm   10 cm Proximal to Ulnar Styloid Process 20 cm   Just Proximal to Ulnar Styloid Process 15.2 cm   Across Hand at [employer] 18.2 cm   At [place] of 2nd Digit 5.4 cm                  [facility] Adult PT Treatment/Exercise - 04/20/16 0001      Self-Care   Other Self-Care Comments  advised patient to try to notice if any activity or activities increase hand symptoms and to then avoid any activity that does; advised her to try to wear her sleeve and gauntlet during the day for now; instructed her in proper donning  technique of these garments     Neck Exercises: Seated   Other Seated Exercise trial of active cervical retraction x 10 in sitting, which seemed to have the effect of decreasing tingling in left fingers   Other Seated Exercise trial of active cervical retraction with extension in sitting x 10 seemed to increase numbness and tingling in left hand     Manual Therapy   Manual Therapy Edema management;Manual Traction   Edema Management left UE circumference measurements taken   Manual Traction trial of manual traction in supine to assess effect on left hand numbness: patient reported decrease in numbness at more proximal areas of 4th and 5th digits of left hand.  Trial of occipto-atlas release to assess effect, but this increased tingling in left fingers and hand.                PT Education - 04/20/16 1437    Education provided Yes   Education Details instructed patient to try 10 reps of active neck retraction every hour to see if that relieves symptoms of numbness and tingling, and to continue if symptoms are not worsened and certainly if they are relieved, but to stop if symptoms are worsened by this.   Person(s) Educated Patient   Methods Explanation   Comprehension Verbalized understanding;Returned demonstration                [facility] Goals - 04/17/16 1613      CC Long Term Goal  #1   Title Patient will report a decrease in pain by 50% so they can perform daily activities with greater ease   Baseline 100% improvement in pain   Time 4   Period Weeks   Status Achieved     CC Long Term Goal  #2   Title Patient will improve shoulder flexion range of motion to 150 degrees to perform activities of daily living and household chores with greater ease   Baseline 158 on 03/20/2016   Time 4   Period Weeks   Status Achieved     CC Long Term Goal  #3   Title Patient with verbalize an understanding of lymphedema risk reduction precautions   Time 4   Period Weeks    Status On-going     CC Long Term Goal  #4   Title Patient will be independent in self manual lymph drainage   Time 4   Period Weeks   Status On-going     CC Long Term Goal  #5   Title Pt will report her edema symptoms in upper  body are about 25% better    Baseline feels 80% better on 04/17/16   Time 4   Period Weeks   Status Achieved            Plan - 04/20/16 1439    Clinical Impression Statement Patient with new complaint today of left forearm and 4th and 5th digit numbness and tingling, so was referred to [place] by Dr. Virgie Dad office.  She was seen to determine whether swelling might be involved in this; this did not seem to be the case as her arm swelling was shown to have reduced on measuring circumferences today.  Neck motions, either active or passive, did seem to affect the numbness and tingling in the hand, so this seems like to be the source of these symptoms, though it is unclear why they might have occurred today.  She was asked to perform neck retraction exercises, which seemd to lessen symptoms, over the weekend as well as to avoid any aggravating activities.  We will reassess after the weekend.  Patient was to see Dr. Isidore Moos, radiation oncologist, this afternoon, so was asked to report these symptoms to her for assessment as well.   Rehab Potential Good   Clinical Impairments Affecting Rehab Potential chemotherapy with neuropathy    PT Frequency 2x / week   PT Duration 6 weeks   PT Treatment/Interventions ADLs/Self Care Home Management;Manual lymph drainage;Compression bandaging;Scar mobilization;Passive range of motion;Patient/family education;Taping;Manual techniques;Therapeutic exercise;Therapeutic activities;Orthotic Fit/Training;DME Instruction   PT Next Visit Plan assess numbness and tingling of left hand and assess effect of cervical retraction exercises; adjust treatment and HEP according to current status   PT Home Exercise Plan neck retraction x 10 every hour IF  beneficial, but NOT to be done if symptoms are aggravated by this   Consulted and Agree with Plan of Care Patient      Patient will benefit from skilled therapeutic intervention in order to improve the following deficits and impairments:  Decreased range of motion, Impaired UE functional use, Decreased activity tolerance, Decreased knowledge of precautions, Decreased skin integrity, Impaired perceived functional ability, Pain, Decreased knowledge of use of DME, Increased edema, Postural dysfunction, Decreased strength, Impaired sensation, Decreased scar mobility  Visit Diagnosis: Localized swelling, mass and lump, neck  Stiffness of left shoulder joint  Pain in left shoulder     Problem List Patient Active Problem List   Diagnosis Date Noted  . Primary malignant neoplasm of left breast with stage 2 nodal metastasis per American Joint Committee on Cancer 7th edition (N2) ([place]) 02/14/2016  . Breast cancer ([place]) 02/02/2016  . 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 Insomnia 09/01/2015  . Breast cancer of upper-outer quadrant of left female breast ([place]) 08/08/2015    Lucifer Soja 04/20/2016, 2:46 PM  [place], [place], [address] Phone: [phone]   Fax:  [phone]  Name: Alison [name] MRN: [id number] Date of Birth: 11-12-1960   Serafina Royals, PT 04/20/16 2:46 PM

## 2016-04-23 ENCOUNTER — Ambulatory Visit
Admission: RE | Admit: 2016-04-23 | Discharge: 2016-04-23 | Disposition: A | Discharge status: Home / Self Care | Payer: 59 | Source: Ambulatory Visit | Attending: Radiation Oncology | Admitting: Radiation Oncology

## 2016-04-23 ENCOUNTER — Ambulatory Visit: Payer: 59 | Admitting: Physical Therapy

## 2016-04-23 ENCOUNTER — Other Ambulatory Visit: Payer: Self-pay | Admitting: *Deleted

## 2016-04-23 ENCOUNTER — Encounter: Payer: Self-pay | Admitting: Radiation Oncology

## 2016-04-23 VITALS — BP 115/61 | HR 83 | Temp 98.3°F | Ht 63.0 in | Wt 156.2 lb

## 2016-04-23 DIAGNOSIS — R221 Localized swelling, mass and lump, neck: Secondary | ICD-10-CM | POA: Diagnosis not present

## 2016-04-23 DIAGNOSIS — C50912 Malignant neoplasm of unspecified site of left female breast: Secondary | ICD-10-CM

## 2016-04-23 DIAGNOSIS — C779 Secondary and unspecified malignant neoplasm of lymph node, unspecified: Secondary | ICD-10-CM | POA: Diagnosis present

## 2016-04-23 DIAGNOSIS — C50412 Malignant neoplasm of upper-outer quadrant of left female breast: Secondary | ICD-10-CM

## 2016-04-23 DIAGNOSIS — M25612 Stiffness of left shoulder, not elsewhere classified: Secondary | ICD-10-CM

## 2016-04-23 DIAGNOSIS — Z510 Encounter for antineoplastic radiation therapy: Secondary | ICD-10-CM | POA: Diagnosis not present

## 2016-04-23 DIAGNOSIS — M25512 Pain in left shoulder: Secondary | ICD-10-CM

## 2016-04-23 MED ORDER — OXYCODONE HCL 5 MG PO TABS: 5.0000 mg | ORAL_TABLET | ORAL | 0 refills | Status: DC | PRN

## 2016-04-23 MED ORDER — HYDROCORTISONE 2.5 % EX CREA: TOPICAL_CREAM | Freq: Three times a day (TID) | CUTANEOUS | 1 refills | Status: DC | PRN

## 2016-04-23 MED ORDER — SONAFINE EX EMUL
1.0000 "application " | Freq: Once | CUTANEOUS | Status: AC
  Administered 2016-04-23: 1 via TOPICAL

## 2016-04-23 NOTE — Progress Notes (Signed)
C50.412 left breast Upper outer quadrant cancer   Current Dose:  28.8 Gy  Projected Dose: 60.4 Gy   Narrative:  The patient presents for routine under treatment assessment.  CBCT/MVCT images/Port film x-rays were reviewed.  The chart was checked.  New pain and numbness and come and go in left top of forearm, left digits 4/5.  Also subacute, continued hurting/stiffness in lower neck, esp when turning neck.   She's been receiving PT for severe neck lymphedema since surgery.  PT does not feel the pain/ numbness in LUE is due to lymphedema. This pain is not necessarily related to when she has her arm over her head for radiotherapy treatments.  Physical Findings:  vitals were not taken for this visit.     Wt Readings from Last 3 Encounters:  04/23/16 156 lb 3.2 oz (70.9 kg)  04/16/16 150 lb 8 oz (68.3 kg)  04/09/16 153 lb 9.6 oz (69.7 kg)   NAD, mild dermatitis of UIQ of left chest No obvious numbness or weakness of upper extremities or hands. Lymphedema, severe, neck.   Impression:  The patient is tolerating radiotherapy.  Plan:  Continue radiotherapy as planned.   hydrocortisone 1% cream prn itching Risk of brachial plexopathy from RT would be exceedingly small due to low dose given thus far. Would like to start by ruling out spine disease/ pinched nerve. Ordering MRI of C spine for "pain in bilateral lower neck, and pain/numbness in left top of forearm and left digits 4/5.  Please image to T2 if possible so we don't miss T1 nerve. Breast cancer." -----------------------------------  Eppie Gibson, MD

## 2016-04-23 NOTE — Progress Notes (Signed)
Alison [name] presents for her 16th fraction of radiation to her Left Chest wall and Sclv. She reports soreness inside her Left Chest wall from her axilla across to the center of her Left Side. She does have a rash type area to her upper inner Left Chest and Left Clavicle area. She tells me this area does itch quite a bit. She is seeing physical therapy regarding her Lympedema and an issue that started Friday. She woke up with her last 3 fingers numb this past Friday and spoke with Dr. Virgie Dad nurse who arranged a PT apt that day. She tells me she improved on Saturday. She did have an experience early this morning where the same fingers were very painful and woke her up. She tells me that they are not painful at this time, but the last two digits are currently numb. She does have another appointment with PT today. She also tells me that she does have shooting pains across her Left Chest "quite a bit" during the day.   BP 115/61   Pulse 83   Temp 98.3 F (36.8 C)   Ht 5\' 3"  (1.6 m)   Wt 156 lb 3.2 oz (70.9 kg)   BMI 27.67 kg/m

## 2016-04-23 NOTE — Therapy (Signed)
[place], [place], [address] Phone: [phone]   Fax:  [phone]  Physical Therapy Treatment  Patient Details  Name: Alison [name] MRN: [id number] Date of Birth: 08/13/1961 Referring Provider: Dr.Byerly   Encounter Date: 04/23/2016      PT End of Session - 04/23/16 1721    Visit Number 13   Number of Visits 21   Date for PT Re-Evaluation 05/29/16   PT Start Time [id number]   PT Stop Time 1656   PT Time Calculation (min) 47 min   Activity Tolerance Patient tolerated treatment well   Behavior During Therapy [facility] for tasks assessed/performed      Past Medical History:  Diagnosis Date  . Asthma     seasonal asthma  . Breast cancer ([place])   . Complication of anesthesia 2007   aspiration pneumonia post hysterectomy.  Blood pressure would drop low- but no problems 01/2016- surgery  . History of blood transfusion   . Hypertension   . Neuropathy ([place])    with chemo  . Osteopenia determined by x-ray   . Pneumonia    1980's and 1990's  . Raynaud's disease   . Rectovaginal fistula    repaired 2014  . Skin cancer 2014   basal- back    Past Surgical History:  Procedure Laterality Date  . ABDOMINAL HYSTERECTOMY  2007   with BSO  . APPENDECTOMY  2007  . AXILLARY LYMPH NODE DISSECTION Left 02/14/2016  . AXILLARY LYMPH NODE DISSECTION Left 02/14/2016   Procedure: LEFT AXILLARY LYMPH NODE DISSECTION;  Surgeon: [name], MD;  Location: [place];  Service: General;  Laterality: Left;  . BREAST RECONSTRUCTION WITH PLACEMENT OF TISSUE EXPANDER AND FLEX HD (ACELLULAR HYDRATED DERMIS) Left 02/02/2016   Procedure: IMMEDIATE LEFT BREAST RECONSTRUCTION WITH PLACEMENT OF TISSUE EXPANDER AND FLEX HD (ACELLULAR HYDRATED DERMIS);  Surgeon: [name], DO;  Location: [place];  Service: Plastics;  Laterality: Left;  . BREAST SURGERY Left 02/02/16   mastectomy with reconstruction  . c-section     . COLON RESECTION  4/'2014  . HERNIA REPAIR    . Port- a- cath placement  07/2015  . PORT-A-CATH REMOVAL Right 02/02/2016   Procedure: REMOVAL PORT-A-CATH;  Surgeon: [name], MD;  Location: [place];  Service: General;  Laterality: Right;  . RADIOACTIVE SEED GUIDED AXILLARY SENTINEL LYMPH NODE Left 02/02/2016   Procedure: LEFT BREAST MASTECTOMY AND RADIOACTIVE SEED GUIDED LEFT SENTINEL LYMPH NODE BIOPSY ;  Surgeon: [name], MD;  Location: [place];  Service: General;  Laterality: Left;    There were no vitals filed for this visit.      Subjective Assessment - 04/23/16 1620    Subjective Did well later that day she was last here and irritation was gone by the next day; last night then the sharp pain returned at about 3 a.m.  Pain meds seemed to help that.   Currently in Pain? Yes   Pain Score 4    Pain Location Arm   Pain Orientation Right;Left  across elbows   Pain Descriptors / Indicators Sore   Aggravating Factors  reaching back with pocketbook to put it in the back seat or just moving the arm back   Pain Relieving Factors not doing aggravating activities            [facility] PT Assessment - 04/23/16 0001      Special Tests    Special Tests  Cervical   Cervical Tests Spurling's     Spurling's   Findings Negative                     [facility] Adult PT Treatment/Exercise - 04/23/16 0001      Self-Care   Other Self-Care Comments  discussed sleeping position and avoiding use of three pillows just behind her head to avoid forced neck flexion, which may be contributing to hand pain     Manual Therapy   Manual Lymphatic Drainage (MLD) In long sitting, diaphragmatic breathing, supraclavicular fossae, left inguinal nodes and right axillary nodes, posterolateral neck; lateral, anterolateral, and anterior neck., and chin.     Kinesiotix   Edema skinkote. kinesiotape in fan shape from left shouler and upper  back toward left  inguinals.                         [facility] Goals - 04/17/16 1613      CC Long Term Goal  #1   Title Patient will report a decrease in pain by 50% so they can perform daily activities with greater ease   Baseline 100% improvement in pain   Time 4   Period Weeks   Status Achieved     CC Long Term Goal  #2   Title Patient will improve shoulder flexion range of motion to 150 degrees to perform activities of daily living and household chores with greater ease   Baseline 158 on 03/20/2016   Time 4   Period Weeks   Status Achieved     CC Long Term Goal  #3   Title Patient with verbalize an understanding of lymphedema risk reduction precautions   Time 4   Period Weeks   Status On-going     CC Long Term Goal  #4   Title Patient will be independent in self manual lymph drainage   Time 4   Period Weeks   Status On-going     CC Long Term Goal  #5   Title Pt will report her edema symptoms in upper body are about 25% better    Baseline feels 80% better on 04/17/16   Time 4   Period Weeks   Status Achieved            Plan - 04/23/16 1721    Clinical Impression Statement Patient's left hand and finger numbness had gotten better after last session, then worsened and woke her in the middle of the night last night.  It was better at the time of her appointment today, probably from using pain medications.  Since both triggering episodes seem to have been at night, we discussed today her sleep position and suggested some changes to that.  Neck compression and foraminal closure tests were done today and were negative.  Otherwise, neck was not addressed today as pain/tingling were not present at time of appointment.  Pt. was asked to try neck retraction again if pain flares.   Rehab Potential Good   Clinical Impairments Affecting Rehab Potential chemotherapy with neuropathy    PT Frequency 2x / week   PT Duration 6 weeks   PT Treatment/Interventions ADLs/Self Care  Home Management;Manual lymph drainage;Compression bandaging;Scar mobilization;Passive range of motion;Patient/family education;Taping;Manual techniques;Therapeutic exercise;Therapeutic activities;Orthotic Fit/Training;DME Instruction   PT Next Visit Plan if hand pain flares again, readdress neck exercises (retraction vs. retraction with extension, etc.); continue manual lymph drainage   PT Home Exercise Plan neck retraction x  10 every hour IF beneficial, but NOT to be done if symptoms are aggravated by this   Consulted and Agree with Plan of Care Patient      Patient will benefit from skilled therapeutic intervention in order to improve the following deficits and impairments:  Decreased range of motion, Impaired UE functional use, Decreased activity tolerance, Decreased knowledge of precautions, Decreased skin integrity, Impaired perceived functional ability, Pain, Decreased knowledge of use of DME, Increased edema, Postural dysfunction, Decreased strength, Impaired sensation, Decreased scar mobility  Visit Diagnosis: Localized swelling, mass and lump, neck  Stiffness of left shoulder joint  Pain in left shoulder     Problem List Patient Active Problem List   Diagnosis Date Noted  . Primary malignant neoplasm of left breast with stage 2 nodal metastasis per American Joint Committee on Cancer 7th edition (N2) ([place]) 02/14/2016  . Breast cancer ([place]) 02/02/2016  . 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 Insomnia 09/01/2015  . Breast cancer of upper-outer quadrant of left female breast ([place]) 08/08/2015    SALISBUR[place],DONNA 04/23/2016, 5:27 PM  [place], [place], [address] Phone: [phone]   Fax:  [phone]  Name: Alison [name] MRN: [id number] Date of Birth: November 13, 1960   Serafina Royals, PT 04/23/16 5:27 PM

## 2016-04-24 ENCOUNTER — Ambulatory Visit
Admission: RE | Admit: 2016-04-24 | Discharge: 2016-04-24 | Disposition: A | Discharge status: Home / Self Care | Payer: 59 | Source: Ambulatory Visit | Attending: Radiation Oncology | Admitting: Radiation Oncology

## 2016-04-24 ENCOUNTER — Ambulatory Visit: Payer: 59 | Admitting: Physical Therapy

## 2016-04-24 ENCOUNTER — Other Ambulatory Visit: Payer: Self-pay | Admitting: Radiation Oncology

## 2016-04-24 DIAGNOSIS — R221 Localized swelling, mass and lump, neck: Secondary | ICD-10-CM

## 2016-04-24 DIAGNOSIS — C50412 Malignant neoplasm of upper-outer quadrant of left female breast: Secondary | ICD-10-CM

## 2016-04-24 DIAGNOSIS — Z510 Encounter for antineoplastic radiation therapy: Secondary | ICD-10-CM | POA: Diagnosis not present

## 2016-04-24 NOTE — Progress Notes (Deleted)
   Weekly Management Note:  outpatient    ICD-9-CM ICD-10-CM   1. Breast cancer of upper-outer quadrant of left female breast (H[facility]) 174.4 C50.412 MR Cervical Spine W Wo Contrast    Current Dose:  28.8 Gy  Projected Dose: 60.4 Gy   Narrative:  The patient presents for routine under treatment assessment.  CBCT/MVCT images/Port film x-rays were reviewed.  The chart was checked.  New pain and numbness and come and go in left top of forearm, left digits 4/5.  Also subacute, continued hurting/stiffness in lower neck, esp when turning neck.   She's been receiving PT for severe neck lymphedema since surgery.  PT does not feel the pain/ numbness in LUE is due to lymphedema. This pain is not necessarily related to when she has her arm over her head for radiotherapy treatments.  Physical Findings:  vitals were not taken for this visit.  Wt Readings from Last 3 Encounters:  04/23/16 156 lb 3.2 oz (70.9 kg)  04/16/16 150 lb 8 oz (68.3 kg)  04/09/16 153 lb 9.6 oz (69.7 kg)   NAD, mild dermatitis of UIQ of left chest No obvious numbness or weakness of upper extremities or hands. Lymphedema, severe, neck.   Impression:  The patient is tolerating radiotherapy.  Plan:  Continue radiotherapy as planned.   hydrocortisone 1% cream prn itching Risk of brachial plexopathy from RT would be exceedingly small due to low dose given thus far. Would like to start by ruling out spine disease/ pinched nerve. Ordering MRI of C spine for "pain in bilateral lower neck, and pain/numbness in left top of forearm and left digits 4/5.  Please image to T2 if possible so we don't miss T1 nerve. Breast cancer." ________________________________   Eppie Gibson, M.D.

## 2016-04-25 ENCOUNTER — Ambulatory Visit
Admission: RE | Admit: 2016-04-25 | Discharge: 2016-04-25 | Disposition: A | Discharge status: Home / Self Care | Payer: 59 | Source: Ambulatory Visit | Attending: Radiation Oncology | Admitting: Radiation Oncology

## 2016-04-25 ENCOUNTER — Telehealth: Payer: Self-pay | Admitting: *Deleted

## 2016-04-25 ENCOUNTER — Encounter: Payer: Self-pay | Admitting: Radiation Oncology

## 2016-04-25 DIAGNOSIS — Z510 Encounter for antineoplastic radiation therapy: Secondary | ICD-10-CM | POA: Diagnosis not present

## 2016-04-25 NOTE — Telephone Encounter (Signed)
Called patient to inform of MRI scheduled for 04-28-16 - arrival time - 9:45 am , no restrictions for test @ [facility], lvm for a return call

## 2016-04-25 NOTE — Therapy (Signed)
[place], [place], [address] Phone: [phone]   Fax:  [phone]  Physical Therapy Treatment  Patient Details  Name: Alison [name] MRN: [id number] Date of Birth: [birth year] Referring Provider: Dr.Byerly   Encounter Date: 04/24/2016    Past Medical History:  Diagnosis Date  . Asthma     seasonal asthma  . Breast cancer ([place])   . Complication of anesthesia 2007   aspiration pneumonia post hysterectomy.  Blood pressure would drop low- but no problems 01/2016- surgery  . History of blood transfusion   . Hypertension   . Neuropathy ([facility])    with chemo  . Osteopenia determined by x-ray   . Pneumonia    1980's and 1990's  . Raynaud's disease   . Rectovaginal fistula    repaired 2014  . Skin cancer 2014   basal- back    Past Surgical History:  Procedure Laterality Date  . ABDOMINAL HYSTERECTOMY  2007   with BSO  . APPENDECTOMY  2007  . AXILLARY LYMPH NODE DISSECTION Left 02/14/2016  . AXILLARY LYMPH NODE DISSECTION Left 02/14/2016   Procedure: LEFT AXILLARY LYMPH NODE DISSECTION;  Surgeon: [name], MD;  Location: [facility];  Service: General;  Laterality: Left;  . BREAST RECONSTRUCTION WITH PLACEMENT OF TISSUE EXPANDER AND FLEX HD (ACELLULAR HYDRATED DERMIS) Left 02/02/2016   Procedure: IMMEDIATE LEFT BREAST RECONSTRUCTION WITH PLACEMENT OF TISSUE EXPANDER AND FLEX HD (ACELLULAR HYDRATED DERMIS);  Surgeon: [name], DO;  Location: [facility];  Service: Plastics;  Laterality: Left;  . BREAST SURGERY Left 02/02/16   mastectomy with reconstruction  . c-section    . COLON RESECTION  4/'2014  . HERNIA REPAIR    . Port- a- cath placement  07/2015  . PORT-A-CATH REMOVAL Right 02/02/2016   Procedure: REMOVAL PORT-A-CATH;  Surgeon: [name], MD;  Location: [facility];  Service: General;  Laterality: Right;  . RADIOACTIVE SEED GUIDED AXILLARY SENTINEL LYMPH NODE  Left 02/02/2016   Procedure: LEFT BREAST MASTECTOMY AND RADIOACTIVE SEED GUIDED LEFT SENTINEL LYMPH NODE BIOPSY ;  Surgeon: [name], MD;  Location: [facility];  Service: General;  Laterality: Left;    There were no vitals filed for this visit.   Ms. Elgersma comes in today and states that he skin is becoming inflamed and irritated and she does not think she can tolerate manual lymph drainage or any treatment for her swelling until radiation is completed. She wants to return after radiation is completed and she is able to resume treatment for lymphedema.  Will discharge her from this episode and resume when with reorder when she is medically cleared to resume PT                                     [place] - 04/25/16 0739      CC Long Term Goal  #1   Title Patient will report a decrease in pain by 50% so they can perform daily activities with greater ease   Status Achieved     CC Long Term Goal  #2   Title Patient will improve shoulder flexion range of motion to 150 degrees to perform activities of daily living and household chores with greater ease   Status Achieved     CC Long Term Goal  #3   Title Patient with verbalize an  understanding of lymphedema risk reduction precautions   Status Achieved     CC Long Term Goal  #4   Title Patient will be independent in self manual lymph drainage   Baseline pt is not able to tolerate manual lymph draiange at this time    Status Not Met     CC Long Term Goal  #5   Title Pt will report her edema symptoms in upper body are about 25% better    Status Not Met          Patient will benefit from skilled therapeutic intervention in order to improve the following deficits and impairments:     Visit Diagnosis: Localized swelling, mass and lump, neck     Problem List Patient Active Problem List   Diagnosis Date Noted  . 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 Breast cancer ([facility]) 02/02/2016  . Chemotherapy-induced neuropathy ([facility]) 12/30/2015  . Insomnia 09/01/2015  . Breast cancer of upper-outer quadrant of left female breast ([facility] [address]) 08/08/2015       PHYSICAL THERAPY DISCHARGE SUMMARY  Visits from Start of Care: 11 Current functional level related to goals / functional outcomes: Pt is not able to tolerate manual lymph drainage due to skin irritation from radiation at this time    Remaining deficits: Lympjedema, pain    Education / Equipment: Self manual lymph drainage, exercise,  Plan: Patient agrees to discharge.  Patient goals were partially met. Patient is being discharged due to the patient's request.  ?????     Alison [name]. Owens Shark PT  Norwood Levo 04/25/2016, 7:42 AM  [place], [place], [address] Phone: [phone]   Fax:  [phone]  Name: Alison [name] MRN: [id number] Date of Birth: 18-Jan-1961

## 2016-04-26 ENCOUNTER — Ambulatory Visit
Admission: RE | Admit: 2016-04-26 | Discharge: 2016-04-26 | Disposition: A | Discharge status: Home / Self Care | Payer: 59 | Source: Ambulatory Visit | Attending: Radiation Oncology | Admitting: Radiation Oncology

## 2016-04-26 ENCOUNTER — Ambulatory Visit: Payer: 59 | Admitting: Physical Therapy

## 2016-04-26 DIAGNOSIS — Z510 Encounter for antineoplastic radiation therapy: Secondary | ICD-10-CM | POA: Diagnosis not present

## 2016-04-27 ENCOUNTER — Ambulatory Visit
Admission: RE | Admit: 2016-04-27 | Discharge: 2016-04-27 | Disposition: A | Discharge status: Home / Self Care | Payer: 59 | Source: Ambulatory Visit | Attending: Radiation Oncology | Admitting: Radiation Oncology

## 2016-04-27 DIAGNOSIS — Z510 Encounter for antineoplastic radiation therapy: Secondary | ICD-10-CM | POA: Diagnosis not present

## 2016-04-28 ENCOUNTER — Ambulatory Visit (HOSPITAL_COMMUNITY)
Admission: RE | Admit: 2016-04-28 | Discharge: 2016-04-28 | Disposition: A | Discharge status: Home / Self Care | Payer: 59 | Source: Ambulatory Visit | Attending: Radiation Oncology | Admitting: Radiation Oncology

## 2016-04-28 DIAGNOSIS — C50412 Malignant neoplasm of upper-outer quadrant of left female breast: Secondary | ICD-10-CM

## 2016-05-01 ENCOUNTER — Ambulatory Visit: Payer: 59 | Admitting: Physical Therapy

## 2016-05-01 ENCOUNTER — Ambulatory Visit
Admission: RE | Admit: 2016-05-01 | Discharge: 2016-05-01 | Disposition: A | Discharge status: Home / Self Care | Payer: 59 | Source: Ambulatory Visit | Attending: Radiation Oncology | Admitting: Radiation Oncology

## 2016-05-01 DIAGNOSIS — Z510 Encounter for antineoplastic radiation therapy: Secondary | ICD-10-CM | POA: Diagnosis not present

## 2016-05-02 ENCOUNTER — Ambulatory Visit
Admission: RE | Admit: 2016-05-02 | Discharge: 2016-05-02 | Disposition: A | Discharge status: Home / Self Care | Payer: 59 | Source: Ambulatory Visit | Attending: Radiation Oncology | Admitting: Radiation Oncology

## 2016-05-02 ENCOUNTER — Encounter: Payer: Self-pay | Admitting: Radiation Oncology

## 2016-05-02 VITALS — BP 132/68 | HR 80 | Temp 99.0°F | Ht 63.0 in | Wt 156.4 lb

## 2016-05-02 DIAGNOSIS — C50412 Malignant neoplasm of upper-outer quadrant of left female breast: Secondary | ICD-10-CM | POA: Diagnosis not present

## 2016-05-02 DIAGNOSIS — Z510 Encounter for antineoplastic radiation therapy: Secondary | ICD-10-CM | POA: Diagnosis not present

## 2016-05-02 DIAGNOSIS — R0789 Other chest pain: Secondary | ICD-10-CM | POA: Diagnosis not present

## 2016-05-02 DIAGNOSIS — R1310 Dysphagia, unspecified: Secondary | ICD-10-CM | POA: Insufficient documentation

## 2016-05-02 MED FILL — CAPECITABINE 500 MG TABLET: 500 | 30 days supply | Qty: 120 | Fill #1

## 2016-05-02 NOTE — Progress Notes (Signed)
Alison [name] is here for her 22nd fraction of radiation to her Left Chest wall/ Sclv. She reports pain in her Left Chest area. She is red to her entire Left Chest. The area to the Left upper inner quadrant of her Chest is red with peeling noted. There is one area noted with scant blood visible. She has been using the sonafine cream twice daily, and hydrocortisone 2% to this area. She will start using neosporin to these areas. She reports some difficulty swallowing. Food will occasionally get stuck in her throat and she needs to crush her bigger pills.   BP 132/68   Pulse 80   Temp 99 F (37.2 C)   Ht 5\' 3"  (1.6 m)   Wt 156 lb 6.4 oz (70.9 kg)   BMI 27.71 kg/m    Wt Readings from Last 3 Encounters:  05/02/16 156 lb 6.4 oz (70.9 kg)  04/23/16 156 lb 3.2 oz (70.9 kg)  04/16/16 150 lb 8 oz (68.3 kg)

## 2016-05-02 NOTE — Progress Notes (Signed)
   Weekly Management Note:  outpatient    ICD-9-CM ICD-10-CM   1. Breast cancer of upper-outer quadrant of left female breast (HCC) 174.4 C50.412     Current Dose:  39.6 Gy  Projected Dose: 60.4 Gy   Narrative:  The patient presents for routine under treatment assessment.  CBCT/MVCT images/Port film x-rays were reviewed.  The chart was checked.  Alison [name] is here for her 22nd fraction of radiation to her left chest wall/ Sclv. She reports pain in her left chest area. She is red on her entire left chest and crossing over the midline. The area to the left upper inner quadrant of her chest is red with peeling noted. There is one area noted with scant blood visible. She states weeping occurs as the day goes on. She has been using the sonafine cream twice daily, and hydrocortisone 2% to this area. She will start using neosporin to these areas. She reports some difficulty swallowing. Food will occasionally get stuck in her throat and she needs to crush her bigger pills.  MRI cancelled due to foreign object in her chest wall.  But the symptoms in her LUE are better.  Physical Findings:  height is 5\' 3"  (1.6 m) and weight is 156 lb 6.4 oz (70.9 kg). Her temperature is 99 F (37.2 C). Her blood pressure is 132/68 and her pulse is 80.   Wt Readings from Last 3 Encounters:  05/02/16 156 lb 6.4 oz (70.9 kg)  04/23/16 156 lb 3.2 oz (70.9 kg)  04/16/16 150 lb 8 oz (68.3 kg)   Dry desquamation in upper inner quadrant and left breast and axilla; diffuse erythema left chest wall  Impression:  The patient is tolerating radiotherapy.  Plan:  Continue radiotherapy as planned.   Hydrogel pads and netting were given to the patient for skin irritation. Consider SLP if swallowing worsens.  May be due to lymphedema ________________________________   Eppie Gibson, M.D.  This document serves as a record of services personally performed by Eppie Gibson, MD. It was created on her behalf by Bethann Humble, a trained  medical scribe. The creation of this record is based on the scribe's personal observations and the provider's statements to them. This document has been checked and approved by the attending provider.

## 2016-05-03 ENCOUNTER — Ambulatory Visit
Admission: RE | Admit: 2016-05-03 | Discharge: 2016-05-03 | Disposition: A | Discharge status: Home / Self Care | Payer: 59 | Source: Ambulatory Visit | Attending: Radiation Oncology | Admitting: Radiation Oncology

## 2016-05-03 ENCOUNTER — Encounter: Payer: 59 | Admitting: Physical Therapy

## 2016-05-03 DIAGNOSIS — Z510 Encounter for antineoplastic radiation therapy: Secondary | ICD-10-CM | POA: Diagnosis not present

## 2016-05-04 ENCOUNTER — Ambulatory Visit
Admission: RE | Admit: 2016-05-04 | Discharge: 2016-05-04 | Disposition: A | Discharge status: Home / Self Care | Payer: 59 | Source: Ambulatory Visit | Attending: Radiation Oncology | Admitting: Radiation Oncology

## 2016-05-04 DIAGNOSIS — Z510 Encounter for antineoplastic radiation therapy: Secondary | ICD-10-CM | POA: Diagnosis not present

## 2016-05-07 ENCOUNTER — Ambulatory Visit: Payer: 59 | Admitting: Radiation Oncology

## 2016-05-07 ENCOUNTER — Ambulatory Visit
Admission: RE | Admit: 2016-05-07 | Discharge: 2016-05-07 | Disposition: A | Discharge status: Home / Self Care | Payer: 59 | Source: Ambulatory Visit | Attending: Radiation Oncology | Admitting: Radiation Oncology

## 2016-05-07 ENCOUNTER — Other Ambulatory Visit: Payer: Self-pay | Admitting: *Deleted

## 2016-05-07 DIAGNOSIS — C50412 Malignant neoplasm of upper-outer quadrant of left female breast: Secondary | ICD-10-CM

## 2016-05-07 DIAGNOSIS — Z510 Encounter for antineoplastic radiation therapy: Secondary | ICD-10-CM | POA: Diagnosis not present

## 2016-05-08 ENCOUNTER — Ambulatory Visit
Admission: RE | Admit: 2016-05-08 | Discharge: 2016-05-08 | Disposition: A | Discharge status: Home / Self Care | Payer: 59 | Source: Ambulatory Visit | Attending: Radiation Oncology | Admitting: Radiation Oncology

## 2016-05-08 ENCOUNTER — Other Ambulatory Visit (HOSPITAL_BASED_OUTPATIENT_CLINIC_OR_DEPARTMENT_OTHER): Payer: 59

## 2016-05-08 ENCOUNTER — Encounter: Payer: 59 | Admitting: Physical Therapy

## 2016-05-08 DIAGNOSIS — C50412 Malignant neoplasm of upper-outer quadrant of left female breast: Secondary | ICD-10-CM | POA: Diagnosis not present

## 2016-05-08 DIAGNOSIS — Z510 Encounter for antineoplastic radiation therapy: Secondary | ICD-10-CM | POA: Diagnosis not present

## 2016-05-08 LAB — COMPREHENSIVE METABOLIC PANEL
ALBUMIN: 3.7 g/dL (ref 3.5–5.0)
ALK PHOS: 60 U/L (ref 40–150)
ALT: 17 U/L (ref 0–55)
AST: 20 U/L (ref 5–34)
Anion Gap: 9 mEq/L (ref 3–11)
BUN: 10.5 mg/dL (ref 7.0–26.0)
CALCIUM: 9.9 mg/dL (ref 8.4–10.4)
CHLORIDE: 106 meq/L (ref 98–109)
CO2: 25 mEq/L (ref 22–29)
CREATININE: 0.8 mg/dL (ref 0.6–1.1)
EGFR: 82 mL/min/{1.73_m2} — ABNORMAL LOW (ref >90)
Glucose: 91 mg/dl (ref 70–140)
Potassium: 4 mEq/L (ref 3.5–5.1)
Sodium: 140 mEq/L (ref 136–145)
TOTAL PROTEIN: 7.3 g/dL (ref 6.4–8.3)

## 2016-05-08 LAB — CBC & DIFF AND RETIC
BASO%: 0.8 % (ref 0.0–2.0)
BASOS ABS: 0 10*3/uL (ref 0.0–0.1)
EOS ABS: 0.2 10*3/uL (ref 0.0–0.5)
EOS%: 4 % (ref 0.0–7.0)
HEMATOCRIT: 35.2 % (ref 34.8–46.6)
HEMOGLOBIN: 12 g/dL (ref 11.6–15.9)
IMMATURE RETIC FRACT: 4.2 % (ref 1.60–10.00)
LYMPH#: 0.9 10*3/uL (ref 0.9–3.3)
LYMPH%: 16.7 % (ref 14.0–49.7)
MCH: 30.8 pg (ref 25.1–34.0)
MCHC: 34.1 g/dL (ref 31.5–36.0)
MCV: 90.5 fL (ref 79.5–101.0)
MONO#: 0.6 10*3/uL (ref 0.1–0.9)
MONO%: 12.1 % (ref 0.0–14.0)
NEUT#: 3.5 10*3/uL (ref 1.5–6.5)
NEUT%: 66.4 % (ref 38.4–76.8)
PLATELETS: 245 10*3/uL (ref 145–400)
RBC: 3.89 10*6/uL (ref 3.70–5.45)
RDW: 16 % — AB (ref 11.2–14.5)
RETIC %: 1.87 % (ref 0.70–2.10)
RETIC CT ABS: 72.74 10*3/uL (ref 33.70–90.70)
WBC: 5.2 10*3/uL (ref 3.9–10.3)

## 2016-05-08 MED ORDER — ZOLPIDEM TARTRATE 5 MG PO TABS: 5.0000 mg | ORAL_TABLET | Freq: Every evening | ORAL | 0 refills | Status: DC | PRN

## 2016-05-08 NOTE — Telephone Encounter (Signed)
Fax for [facility] refill received and done.

## 2016-05-09 ENCOUNTER — Encounter: Payer: Self-pay | Admitting: Radiation Oncology

## 2016-05-09 ENCOUNTER — Ambulatory Visit
Admission: RE | Admit: 2016-05-09 | Discharge: 2016-05-09 | Disposition: A | Discharge status: Home / Self Care | Payer: 59 | Source: Ambulatory Visit | Attending: Radiation Oncology | Admitting: Radiation Oncology

## 2016-05-09 ENCOUNTER — Telehealth: Payer: Self-pay

## 2016-05-09 ENCOUNTER — Ambulatory Visit: Payer: 59 | Admitting: Radiation Oncology

## 2016-05-09 VITALS — BP 106/61 | HR 87 | Temp 98.6°F | Ht 63.0 in | Wt 160.5 lb

## 2016-05-09 DIAGNOSIS — C50412 Malignant neoplasm of upper-outer quadrant of left female breast: Secondary | ICD-10-CM

## 2016-05-09 DIAGNOSIS — C50912 Malignant neoplasm of unspecified site of left female breast: Secondary | ICD-10-CM

## 2016-05-09 DIAGNOSIS — C779 Secondary and unspecified malignant neoplasm of lymph node, unspecified: Secondary | ICD-10-CM | POA: Insufficient documentation

## 2016-05-09 DIAGNOSIS — Z510 Encounter for antineoplastic radiation therapy: Secondary | ICD-10-CM | POA: Diagnosis not present

## 2016-05-09 MED ORDER — ZOLPIDEM TARTRATE 5 MG PO TABS: 5.0000 mg | ORAL_TABLET | Freq: Every evening | ORAL | 0 refills | Status: DC | PRN

## 2016-05-09 MED ORDER — OXYCODONE HCL 5 MG PO TABS: 5.0000 mg | ORAL_TABLET | ORAL | 0 refills | Status: DC | PRN

## 2016-05-09 MED ORDER — SONAFINE EX EMUL
1.0000 "application " | Freq: Once | CUTANEOUS | Status: AC
  Administered 2016-05-09: 1 via TOPICAL

## 2016-05-09 MED ORDER — OXYCODONE HCL ER 20 MG PO T12A: 40.0000 mg | EXTENDED_RELEASE_TABLET | Freq: Two times a day (BID) | ORAL | 0 refills | Status: DC

## 2016-05-09 NOTE — Telephone Encounter (Signed)
Pharmacy did not get ambien phoned in on 9/11 by Alison [name]. Phoned in today.

## 2016-05-09 NOTE — Telephone Encounter (Signed)
Pt called for oxycontin and oxycodone refill. rx prepared for signature.

## 2016-05-09 NOTE — Progress Notes (Signed)
Alison [name] is here for her 27th fraction of radiation to her Left Chest wall. She reports pain a 5/10 in her Left Chest. She reports fatigue, but is able to complete her normal activities. She is going to bed earlier at night, but not sleeping well. Her Left Chest is red, burning. She has an area to her upper outer quadrant of her Left Chest which is bleeding at times. She is using neosporin to this area. She plans to continue to use her hydrogel pads to her Left Chest. She is also using sonafine cream which is still present from applying this morning.   BP 106/61   Pulse 87   Temp 98.6 F (37 C)   Ht 5\' 3"  (1.6 m)   Wt 160 lb 8 oz (72.8 kg)   BMI 28.43 kg/m

## 2016-05-09 NOTE — Progress Notes (Signed)
   Weekly Management Note:  Outpatient C50.412 (Chief Dx)    ICD-9-CM ICD-10-CM   1. Primary malignant neoplasm of left breast with stage 2 nodal metastasis per American Joint Committee on Cancer 7th edition (N2) (HCC) 174.9 C50.912 SONAFINE emulsion 1 application   [id number]   2. Breast cancer of upper-outer quadrant of left female breast (HCC) 174.4 C50.412     Current Dose:  48.6 Gy  Projected Dose: 60.4 Gy   Narrative:  The patient presents for routine under treatment assessment.  CBCT/MVCT images/Port film x-rays were reviewed.  The chart was checked.   Alison [name] is here for her 27th fraction of radiation to her left chest wall. She reports pain a 5/10 in her left chest. She reports fatigue, but is able to complete her normal activities. She is going to bed earlier at night but not sleeping well. Her left chest is red, and burning. She has an area to her upper outer quadrant of her left chest which is bleeding at times. She is using neosporin to this area. She plans to continue to use her hydrogel pads to her left chest. She is also using Sonafine cream which is still present from applying this morning.  Physical Findings:  height is 5\' 3"  (1.6 m) and weight is 160 lb 8 oz (72.8 kg). Her temperature is 98.6 F (37 C). Her blood pressure is 106/61 and her pulse is 87.   Wt Readings from Last 3 Encounters:  05/09/16 160 lb 8 oz (72.8 kg)  05/02/16 156 lb 6.4 oz (70.9 kg)  04/23/16 156 lb 3.2 oz (70.9 kg)   She has bright erythema throughout the left chest wall with dry desquamation in the upper inner quadrant and moist desquamation in the axilla.  Impression:  The patient is tolerating radiotherapy.  Plan:  Continue radiotherapy as planned.   She was advised to put neosporin in the areas with desquamation and Sonafine elsewhere. She will try soaking tea bags in water and cooling them off and placing them on her skin to soothe it. ________________________________   Eppie Gibson,  M.D.  This document serves as a record of services personally performed by Eppie Gibson, MD. It was created on her behalf by Bethann Humble, a trained medical scribe. The creation of this record is based on the scribe's personal observations and the provider's statements to them. This document has been checked and approved by the attending provider.

## 2016-05-09 NOTE — Addendum Note (Signed)
Encounter addended by: Ernst Spell, RN on: 05/09/2016  2:38 PM<BR>    Actions taken: MAR administration accepted

## 2016-05-09 NOTE — Telephone Encounter (Signed)
lvm rx ready for pickup

## 2016-05-10 ENCOUNTER — Ambulatory Visit: Admission: RE | Admit: 2016-05-10 | Payer: 59 | Source: Ambulatory Visit

## 2016-05-10 ENCOUNTER — Ambulatory Visit
Admission: RE | Admit: 2016-05-10 | Discharge: 2016-05-10 | Disposition: A | Discharge status: Home / Self Care | Payer: 59 | Source: Ambulatory Visit | Attending: Radiation Oncology | Admitting: Radiation Oncology

## 2016-05-10 ENCOUNTER — Encounter: Payer: 59 | Admitting: Physical Therapy

## 2016-05-10 DIAGNOSIS — C50412 Malignant neoplasm of upper-outer quadrant of left female breast: Secondary | ICD-10-CM

## 2016-05-10 NOTE — Progress Notes (Signed)
  Radiation Oncology         ([phone]) [phone] ________________________________  Name: Alison [name] MRN: [id number]  Date: 05/10/2016  DOB: 02/24/1961  Weekly Radiation Therapy Management    ICD-9-CM ICD-10-CM   1. Breast cancer of upper-outer quadrant of left female breast (HCC) 174.4 C50.412      Current Dose: 48.6 Gy     Planned Dose:  60.4 Gy  Narrative . . . . . . . . The patient presents for an UNSCHEDULED treatment assessment.                              The patient was seen on the treatment table. The therapists noted an increase in radiation reaction.                                 Set-up films were reviewed.                                 The chart was checked. Physical Findings. . .  vitals were not taken for this visit.  The patient has brisk erythema throughout most of the left chest area and some moist desquamation. Impression . . . . . . 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 The patient's skin reaction has progressed and I would recommend a break in her treatment for the next two days and over the weekend. The patient will apply polysporin. She will call if she develops a fever. Plan . . . . . . . . . . . . The patient will be seen prior to her next radiation treatment next Monday, 9/18, by Dr. Isidore Moos. ________________________________   Blair Promise, PhD, MD  This document serves as a record of services personally performed by Gery Pray, MD. It was created on his behalf by Darcus Austin, a trained medical scribe. The creation of this record is based on the scribe's personal observations and the provider's statements to them. This document has been checked and approved by the attending provider.

## 2016-05-11 ENCOUNTER — Ambulatory Visit: Payer: 59

## 2016-05-11 ENCOUNTER — Ambulatory Visit: Payer: 59 | Admitting: Radiation Oncology

## 2016-05-11 ENCOUNTER — Ambulatory Visit: Admission: RE | Admit: 2016-05-11 | Payer: 59 | Source: Ambulatory Visit

## 2016-05-14 ENCOUNTER — Ambulatory Visit
Admission: RE | Admit: 2016-05-14 | Discharge: 2016-05-14 | Disposition: A | Discharge status: Home / Self Care | Payer: 59 | Source: Ambulatory Visit | Attending: Radiation Oncology | Admitting: Radiation Oncology

## 2016-05-14 ENCOUNTER — Ambulatory Visit: Payer: 59

## 2016-05-14 ENCOUNTER — Encounter: Payer: Self-pay | Admitting: Radiation Oncology

## 2016-05-14 ENCOUNTER — Ambulatory Visit: Admission: RE | Admit: 2016-05-14 | Payer: 59 | Source: Ambulatory Visit

## 2016-05-14 VITALS — BP 124/79 | HR 76 | Temp 98.8°F | Ht 63.0 in | Wt 161.6 lb

## 2016-05-14 DIAGNOSIS — C50412 Malignant neoplasm of upper-outer quadrant of left female breast: Secondary | ICD-10-CM

## 2016-05-14 DIAGNOSIS — Z510 Encounter for antineoplastic radiation therapy: Secondary | ICD-10-CM | POA: Diagnosis not present

## 2016-05-14 NOTE — Progress Notes (Signed)
   Weekly Management Note:  Outpatient C50.412 (Chief Dx)    ICD-9-CM ICD-10-CM   1. Breast cancer of upper-outer quadrant of left female breast (HCC) 174.4 C50.412     Current Dose:  48.6 Gy  Projected Dose: 60.4 Gy   Narrative:  The patient presents for routine under treatment assessment.  CBCT/MVCT images/Port film x-rays were reviewed.  The chart was checked.   She has bleeding at times to the upper area of her Left Chest. She does have a gauze in place at this time. The area appeared to blister at one point last week. She was not treated on Thursday or Friday because of skin irritation at the advice of Dr. Sondra Come. She has not been treated yet today. She is using neosporin to these areas. She has hyperpigmentation to her left upper back with dry skin visible, which she has put sonafine on for relief.   Physical Findings:  height is 5\' 3"  (1.6 m) and weight is 161 lb 9.6 oz (73.3 kg). Her temperature is 98.8 F (37.1 C). Her blood pressure is 124/79 and her pulse is 76.   Wt Readings from Last 3 Encounters:  05/14/16 161 lb 9.6 oz (73.3 kg)  05/09/16 160 lb 8 oz (72.8 kg)  05/02/16 156 lb 6.4 oz (70.9 kg)   She has bright erythema throughout the left chest wall with superficial bleeding and moist desquamation in the upper inner quadrant and moist desquamation in the axilla.  Impression:  The patient is tolerating radiotherapy with difficulty.  Plan:  Resume radiotherapy by starting her boost - the scar area isn't peeling or bleeding.  In a few days she can receive her last fraction of chest wall/ RNI.  I informed the machine and wrote a note in her boost Rx.  ________________________________   Eppie Gibson, M.D.  This document serves as a record of services personally performed by Eppie Gibson, MD. It was created on her behalf by Bethann Humble, a trained medical scribe. The creation of this record is based on the scribe's personal observations and the provider's statements to them.  This document has been checked and approved by the attending provider.

## 2016-05-14 NOTE — Progress Notes (Signed)
Alison [name] is here for her 28th fraction of radiation to her Left Chest wall and Sclv. She reports pain a 3/10 to her Left Breast. She is taking oxycontin 20 mg twice daily and oxycodone 10 mg every 4 hours. She is not sleeping well and waking up in pain. Her Left Chest wall is red. She has bleeding at times to the upper area of her Left Chest. She does have a gauze in place at this time. The area appeared to blister at one point last week. She was not treated on Thursday or Friday because of skin irritation at the advice of Dr. Sondra Come. She has not been treated yet today. She is using neosporin to these areas. She has hyperpigmentation to her left upper back with dry skin visible, which she has put sonafine on for relief.   BP 124/79   Pulse 76   Temp 98.8 F (37.1 C)   Ht 5\' 3"  (1.6 m)   Wt 161 lb 9.6 oz (73.3 kg)   BMI 28.63 kg/m    Wt Readings from Last 3 Encounters:  05/14/16 161 lb 9.6 oz (73.3 kg)  05/09/16 160 lb 8 oz (72.8 kg)  05/02/16 156 lb 6.4 oz (70.9 kg)

## 2016-05-15 ENCOUNTER — Ambulatory Visit: Payer: 59

## 2016-05-15 ENCOUNTER — Other Ambulatory Visit (HOSPITAL_BASED_OUTPATIENT_CLINIC_OR_DEPARTMENT_OTHER): Payer: 59

## 2016-05-15 ENCOUNTER — Other Ambulatory Visit: Payer: Self-pay | Admitting: *Deleted

## 2016-05-15 ENCOUNTER — Other Ambulatory Visit: Payer: 59

## 2016-05-15 DIAGNOSIS — N390 Urinary tract infection, site not specified: Secondary | ICD-10-CM

## 2016-05-15 DIAGNOSIS — Z510 Encounter for antineoplastic radiation therapy: Secondary | ICD-10-CM | POA: Diagnosis not present

## 2016-05-15 LAB — URINALYSIS, MICROSCOPIC - CHCC
Bilirubin (Urine): NEGATIVE
GLUCOSE UR CHCC: NEGATIVE mg/dL
Ketones: NEGATIVE mg/dL
Nitrite: NEGATIVE
PH: 6 (ref 4.6–8.0)
PROTEIN: NEGATIVE mg/dL
SPECIFIC GRAVITY, URINE: 1.005 (ref 1.003–1.035)
UROBILINOGEN UR: 0.2 mg/dL (ref 0.2–1)

## 2016-05-15 MED ORDER — CIPROFLOXACIN HCL 500 MG PO TABS: 500.0000 mg | ORAL_TABLET | Freq: Two times a day (BID) | ORAL | 0 refills | Status: DC

## 2016-05-15 NOTE — Telephone Encounter (Signed)
This RN spoke with pt per her call stating onset of " burning with urination ".  Pt states prior history of cystitis with above symptoms.  No fever at present.  Plan is to start cipro - obtain urine with radiation visit today.  Prescription sent to pharmacy.

## 2016-05-16 ENCOUNTER — Ambulatory Visit: Payer: 59

## 2016-05-16 DIAGNOSIS — Z510 Encounter for antineoplastic radiation therapy: Secondary | ICD-10-CM | POA: Diagnosis not present

## 2016-05-16 LAB — URINE CULTURE

## 2016-05-17 ENCOUNTER — Ambulatory Visit: Payer: 59

## 2016-05-17 DIAGNOSIS — Z510 Encounter for antineoplastic radiation therapy: Secondary | ICD-10-CM | POA: Diagnosis not present

## 2016-05-18 ENCOUNTER — Ambulatory Visit
Admission: RE | Admit: 2016-05-18 | Discharge: 2016-05-18 | Disposition: A | Discharge status: Home / Self Care | Payer: 59 | Source: Ambulatory Visit | Attending: Radiation Oncology | Admitting: Radiation Oncology

## 2016-05-18 DIAGNOSIS — Z510 Encounter for antineoplastic radiation therapy: Secondary | ICD-10-CM | POA: Diagnosis not present

## 2016-05-21 ENCOUNTER — Encounter: Payer: Self-pay | Admitting: Radiation Oncology

## 2016-05-21 ENCOUNTER — Ambulatory Visit
Admission: RE | Admit: 2016-05-21 | Discharge: 2016-05-21 | Disposition: A | Discharge status: Home / Self Care | Payer: 59 | Source: Ambulatory Visit | Attending: Radiation Oncology | Admitting: Radiation Oncology

## 2016-05-21 ENCOUNTER — Other Ambulatory Visit: Payer: Self-pay | Admitting: *Deleted

## 2016-05-21 VITALS — BP 115/65 | HR 84 | Temp 98.8°F | Ht 63.0 in | Wt 159.8 lb

## 2016-05-21 DIAGNOSIS — C50412 Malignant neoplasm of upper-outer quadrant of left female breast: Secondary | ICD-10-CM | POA: Insufficient documentation

## 2016-05-21 DIAGNOSIS — Z510 Encounter for antineoplastic radiation therapy: Secondary | ICD-10-CM | POA: Diagnosis not present

## 2016-05-21 MED ORDER — SONAFINE EX EMUL
1.0000 "application " | Freq: Once | CUTANEOUS | Status: AC
  Administered 2016-05-21: 1 via TOPICAL

## 2016-05-21 NOTE — Addendum Note (Signed)
Encounter addended by: Ernst Spell, RN on: 05/21/2016  3:45 PM<BR>    Actions taken: MAR administration accepted

## 2016-05-21 NOTE — Progress Notes (Signed)
Ms. Proefrock is here for her last fraction of radiation to her Left Chest wall and Sclv/PAB. She reports pain a 4/10 to her Left Chest which she relates to Lymphedema. She is asking to resume PT again, and tells me she needs Dr. Isidore Moos to approve this to happen. She reports much improvement in her Left Chest since starting her Boost Radiation. She has peeling noted to the upper area of her Left Chest. She does have open areas noted toward her Left Axilla which she is using neosporin for. She was given another tube of sonafine to use for the next several weeks and then will begin Vitamin E cream.   BP 115/65   Pulse 84   Temp 98.8 F (37.1 C)   Ht 5\' 3"  (1.6 m)   Wt 159 lb 12.8 oz (72.5 kg)   BMI 28.31 kg/m

## 2016-05-21 NOTE — Progress Notes (Signed)
   Weekly Management Note:  Outpatient C50.412 (Chief Dx)    ICD-9-CM ICD-10-CM   1. Breast cancer of upper-outer quadrant of left female breast (HCC) 174.4 C50.412 SONAFINE emulsion 1 application    Current Dose:  60.4 Gy  Projected Dose: 60.4 Gy   Narrative:  The patient presents for routine under treatment assessment.  CBCT/MVCT images/Port film x-rays were reviewed.  The chart was checked. Skin is healing, doing well.  Physical Findings:  height is 5\' 3"  (1.6 m) and weight is 159 lb 12.8 oz (72.5 kg). Her temperature is 98.8 F (37.1 C). Her blood pressure is 115/65 and her pulse is 84.   Wt Readings from Last 3 Encounters:  05/21/16 159 lb 12.8 oz (72.5 kg)  05/14/16 161 lb 9.6 oz (73.3 kg)  05/09/16 160 lb 8 oz (72.8 kg)   She has bright erythema throughout the left chest wall, dry desquamation and a small fissure of moist desquamation in the axilla.  Impression:  The patient tolerated radiotherapy    Plan:  F/u in 61mo.  Skin care reviewed. She may resume PT. ________________________________   Eppie Gibson, M.D.  This document serves as a record of services personally performed by Eppie Gibson, MD. It was created on her behalf by Bethann Humble, a trained medical scribe. The creation of this record is based on the scribe's personal observations and the provider's statements to them. This document has been checked and approved by the attending provider.

## 2016-05-22 ENCOUNTER — Other Ambulatory Visit: Payer: Self-pay | Admitting: Radiation Oncology

## 2016-05-22 ENCOUNTER — Ambulatory Visit (HOSPITAL_BASED_OUTPATIENT_CLINIC_OR_DEPARTMENT_OTHER): Payer: 59 | Admitting: Oncology

## 2016-05-22 ENCOUNTER — Other Ambulatory Visit (HOSPITAL_BASED_OUTPATIENT_CLINIC_OR_DEPARTMENT_OTHER): Payer: 59

## 2016-05-22 VITALS — BP 116/73 | HR 100 | Temp 98.8°F | Resp 18 | Ht 63.0 in | Wt 159.2 lb

## 2016-05-22 DIAGNOSIS — C50412 Malignant neoplasm of upper-outer quadrant of left female breast: Secondary | ICD-10-CM

## 2016-05-22 DIAGNOSIS — C773 Secondary and unspecified malignant neoplasm of axilla and upper limb lymph nodes: Secondary | ICD-10-CM

## 2016-05-22 DIAGNOSIS — Z9221 Personal history of antineoplastic chemotherapy: Secondary | ICD-10-CM

## 2016-05-22 DIAGNOSIS — Z79811 Long term (current) use of aromatase inhibitors: Secondary | ICD-10-CM | POA: Diagnosis not present

## 2016-05-22 DIAGNOSIS — C50919 Malignant neoplasm of unspecified site of unspecified female breast: Secondary | ICD-10-CM

## 2016-05-22 DIAGNOSIS — Z923 Personal history of irradiation: Secondary | ICD-10-CM

## 2016-05-22 DIAGNOSIS — C779 Secondary and unspecified malignant neoplasm of lymph node, unspecified: Principal | ICD-10-CM

## 2016-05-22 DIAGNOSIS — C50912 Malignant neoplasm of unspecified site of left female breast: Secondary | ICD-10-CM

## 2016-05-22 DIAGNOSIS — G4700 Insomnia, unspecified: Secondary | ICD-10-CM

## 2016-05-22 LAB — COMPREHENSIVE METABOLIC PANEL
ALBUMIN: 3.5 g/dL (ref 3.5–5.0)
ALT: 23 U/L (ref 0–55)
ANION GAP: 11 meq/L (ref 3–11)
AST: 26 U/L (ref 5–34)
Alkaline Phosphatase: 74 U/L (ref 40–150)
BUN: 12.2 mg/dL (ref 7.0–26.0)
CALCIUM: 9.6 mg/dL (ref 8.4–10.4)
CO2: 19 meq/L — AB (ref 22–29)
Chloride: 108 mEq/L (ref 98–109)
Creatinine: 0.8 mg/dL (ref 0.6–1.1)
EGFR: 80 mL/min/{1.73_m2} — AB (ref >90)
GLUCOSE: 142 mg/dL — AB (ref 70–140)
Potassium: 4.3 mEq/L (ref 3.5–5.1)
Sodium: 139 mEq/L (ref 136–145)
TOTAL PROTEIN: 6.9 g/dL (ref 6.4–8.3)
Total Bilirubin: <0.3 mg/dL (ref 0.20–1.20)

## 2016-05-22 LAB — CBC WITH DIFFERENTIAL/PLATELET
BASO%: 1.1 % (ref 0.0–2.0)
Basophils Absolute: 0.1 10*3/uL (ref 0.0–0.1)
EOS ABS: 0.2 10*3/uL (ref 0.0–0.5)
EOS%: 4.7 % (ref 0.0–7.0)
HEMATOCRIT: 36.5 % (ref 34.8–46.6)
HGB: 12.2 g/dL (ref 11.6–15.9)
LYMPH#: 0.8 10*3/uL — AB (ref 0.9–3.3)
LYMPH%: 14.9 % (ref 14.0–49.7)
MCH: 30.5 pg (ref 25.1–34.0)
MCHC: 33.5 g/dL (ref 31.5–36.0)
MCV: 91.3 fL (ref 79.5–101.0)
MONO#: 0.6 10*3/uL (ref 0.1–0.9)
MONO%: 12.3 % (ref 0.0–14.0)
NEUT%: 67 % (ref 38.4–76.8)
NEUTROS ABS: 3.5 10*3/uL (ref 1.5–6.5)
PLATELETS: 327 10*3/uL (ref 145–400)
RBC: 3.99 10*6/uL (ref 3.70–5.45)
RDW: 17.5 % — ABNORMAL HIGH (ref 11.2–14.5)
WBC: 5.2 10*3/uL (ref 3.9–10.3)

## 2016-05-22 MED ORDER — ANASTROZOLE 1 MG PO TABS: 1.0000 mg | ORAL_TABLET | Freq: Every day | ORAL | 4 refills | Status: DC

## 2016-05-22 MED ORDER — TRAMADOL HCL 50 MG PO TABS: 50.0000 mg | ORAL_TABLET | Freq: Four times a day (QID) | ORAL | 3 refills | Status: DC | PRN

## 2016-05-22 NOTE — Progress Notes (Signed)
[place]  [phone] [phone]   ID: Alison [name] DOB: 11/08/1960  MR#: [id number]  WIO#:[id number]  Patient Care Team: [name], MD as PCP - General (Internal Medicine) Erroll Luna, MD as Consulting Physician (General Surgery) Chauncey Cruel, MD as Consulting Physician (Oncology) Aloha Gell, MD as Consulting Physician (Obstetrics and Gynecology) Harriett Sine, MD as Consulting Physician (Dermatology) Eppie Gibson, MD as Attending Physician (Radiation Oncology) PCP: [name], MD OTHER MD:  CHIEF COMPLAINT: Estrogen receptor positive breast cancer  CURRENT TREATMENT: Anastrozole  BREAST CANCER HISTORY: From the original intake note:  Anagha had routine screening mammography with tomography [facility] February 20 11/14/2014 showing a possible mass in the right breast. Diagnostic right mammography with right breast ultrasonography 11/01/2014 found the breast density to be category C. In the upper outer quadrant of the right breast there was a partially obscured mass which on physical exam was palpable as an area of thickening at the 10:00 position 8 cm from the nipple. Ultrasound showed multiple cysts in the upper outer right breast corresponding to the mass in question.  In November 2016 the patient felt a change in the upper left breast and brought it to her primary care physician's attention. Diagnostic left mammography with tomosynthesis and left breast ultrasonography at [facility] December 01/14/2015 found trabecular thickening throughout the left breast with skin thickening, but no circumscribed mass or suspicious calcifications. A rounded mass in the left axilla measured 6 mm. There was an additional mildly prominent lymph node in the left axilla which was what the patient had been palpating. Ultrasonography showed ill-defined swelling throughout the inner left breast with overlying skin thickening..  At the 2:00 position 4 cm from the nipple there was an irregular hypoechoic mass measuring 2.3 cm. A second mass at the 1:00 area 3 cm from the nipple measured 1 cm. The distance between these 2 masses was 1.8 cm. There was also a round hypoechoic left axillary mass measuring 0.6 cm. Overall the was an area of 3.7 cm of mixed echogenicity corresponding to the area of palpable soft tissue swelling.   On 08/04/2015 the patient underwent biopsy of the mass at the 2:00 axis 4 cm from the nipple and a second biopsy was performed of the hypoechoic mass at the 1:00 position 3 cm from the nipple. Biopsy of the suspicious nodule in the lower left axilla was attempted but could not be completed as the mass became obscured after administration of lidocaine.  The pathology from both left breast biopsies 08/04/2015 ([id number]) showed invasive ductal carcinoma, grade 3. The prognostic profile obtained from the larger tumor showed estrogen receptor to be strongly positive at 95%, progesterone receptor positive at 30%, with an MIB-1 of 12%, and no HER-2 amplification, the signals ratio being 1.22 and the number per cell 2.20.  On 08/09/2015 the patient underwent biopsy of this suspicious left axillary lymph node noted above, and this showed ([id number]) metastatic carcinoma with extracapsular extension.  The patient's subsequent history is as detailed below  INTERVAL HISTORY: Alison [name] returns for follow up of her breast cancer accompanied by her husband [name]. She completed her radiation treatments September 25. The last few treatments were "awful". She had blistering and bleeding problems and significant pain. That is just beginning to improve. She received since that time zinc doses of capecitabine with her radiation treatments, without significant diarrhea, palmar plantar erythrodysesthesia, or mouth sores. Doubtless however the capecitabine contributed to the  increased side effects of the radiation  REVIEW OF  SYSTEMS: Alison [name] has pain in her left axilla and the left upper extremity, which has been ongoing. Gabapentin does help to some extent. She is very fatigued. She has some night sweats. Her ankles swell. She has heartburn problems. She is using Robaxin and occasional oxycodone for her pain. This can constipate her. She is currently not very active but is hoping to be able to start an exercise program soon. She is concerned about her weight gain. A detailed review of systems today was otherwise stable  PAST MEDICAL HISTORY: Past Medical History:  Diagnosis Date  . Asthma     seasonal asthma  . Breast cancer ([place])   . Complication of anesthesia 2007   aspiration pneumonia post hysterectomy.  Blood pressure would drop low- but no problems 01/2016- surgery  . History of blood transfusion   . Hypertension   . Neuropathy ([place])    with chemo  . Osteopenia determined by x-ray   . Pneumonia    1980's and 1990's  . Raynaud's disease   . Rectovaginal fistula    repaired 2014  . Skin cancer 2014   basal- back    PAST SURGICAL HISTORY: Past Surgical History:  Procedure Laterality Date  . ABDOMINAL HYSTERECTOMY  2007   with BSO  . APPENDECTOMY  2007  . AXILLARY LYMPH NODE DISSECTION Left 02/14/2016  . AXILLARY LYMPH NODE DISSECTION Left 02/14/2016   Procedure: LEFT AXILLARY LYMPH NODE DISSECTION;  Surgeon: [name], MD;  Location: [facility];  Service: General;  Laterality: Left;  . BREAST RECONSTRUCTION WITH PLACEMENT OF TISSUE EXPANDER AND FLEX HD (ACELLULAR HYDRATED DERMIS) Left 02/02/2016   Procedure: IMMEDIATE LEFT BREAST RECONSTRUCTION WITH PLACEMENT OF TISSUE EXPANDER AND FLEX HD (ACELLULAR HYDRATED DERMIS);  Surgeon: [name], DO;  Location: [facility];  Service: Plastics;  Laterality: Left;  . BREAST SURGERY Left 02/02/16   mastectomy with reconstruction  . c-section    . COLON RESECTION  4/'2014  . HERNIA REPAIR    . Port- a- cath placement  07/2015  .  PORT-A-CATH REMOVAL Right 02/02/2016   Procedure: REMOVAL PORT-A-CATH;  Surgeon: [name], MD;  Location: [facility];  Service: General;  Laterality: Right;  . RADIOACTIVE SEED GUIDED AXILLARY SENTINEL LYMPH NODE Left 02/02/2016   Procedure: LEFT BREAST MASTECTOMY AND RADIOACTIVE SEED GUIDED LEFT SENTINEL LYMPH NODE BIOPSY ;  Surgeon: [name], MD;  Location: [facility];  Service: General;  Laterality: Left;    FAMILY HISTORY Family History  Problem Relation Age of Onset  . Lung cancer Father    the patient's father died from lung cancer at the age of 3 in the setting of tobacco abuse. The patient's mother died at the age of 34 with pancreatic cancer. The patient had no siblings. There is no history of breast or ovarian cancer in the family.  GYNECOLOGIC HISTORY:  No LMP recorded. Patient has had a hysterectomy. Menarche age 37, first live birth age 71. The patient is GX P1. She is status post total hysterectomy with bilateral salpingo-oophorectomy. She has been on hormone replacement since that time and is tapering to off at the time of this dictation.   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 She used to work at [employer]. Her husband [name] works for [employer] division at a [employer] their son [name] is currently at home.    ADVANCED DIRECTIVES: Not in place  HEALTH MAINTENANCE: Social History  Substance Use Topics  . Smoking status: Former Smoker    Packs/day: 1.00    Years: 10.00    Types: Cigarettes    Quit date: 11/25/2008  . Smokeless tobacco: Never Used     Comment: on and off smoker never consistent  . Alcohol use 2.4 oz/week    4 Glasses of wine per week     Colonoscopy: April 2014/ [facility]naldo [facility]  PAP: s/p hysterectomy  Bone density: August 2016/ osteopenia  Lipid panel:  Allergies  Allergen Reactions  . Sulfa Antibiotics Other (See Comments)    Blisters in mouth  . Zofran [Ondansetron  Hcl] Other (See Comments)    Severe Headaches     Current Outpatient Prescriptions  Medication Sig Dispense Refill  . cetirizine (ZYRTEC) 10 MG tablet Take 10 mg by mouth daily.    . Cholecalciferol (VITAMIN D3) 2000 UNITS capsule Take 2,000 Units by mouth daily.     . eszopiclone (LUNESTA) 2 MG TABS tablet Take 1 tablet (2 mg total) by mouth at bedtime. Take immediately before bedtime 30 tablet 0  . Fenofibric Acid 105 MG TABS Take 1 tablet by mouth daily.    . fluticasone (FLONASE) 50 MCG/ACT nasal spray Place 2 sprays into both nostrils daily. Reported on 11/18/2015    . gabapentin (NEURONTIN) 300 MG capsule Take 1 capsule (300 mg total) by mouth 3 (three) times daily. Patient takes 100 mg 3 times a day 270 capsule 1  . hydrocortisone 2.5 % cream Apply topically 3 (three) times daily as needed. 28 g 1  . ibuprofen (ADVIL,MOTRIN) 200 MG tablet Take 400 mg by mouth every 6 (six) hours as needed for headache, mild pain or moderate pain. Reported on 01/06/2016    . levocetirizine (XYZAL) 5 MG tablet Take 5 mg by mouth every evening.    Marland Kitchen LORazepam (ATIVAN) 0.5 MG tablet Take 0.5 mg by mouth at bedtime.    . methocarbamol (ROBAXIN) 500 MG tablet Take 1 tablet (500 mg total) by mouth every 6 (six) hours as needed for muscle spasms. 40 tablet 1  . Multiple Vitamin (MULTIVITAMIN) tablet Take 1 tablet by mouth daily.    Marland Kitchen NIFEdipine (PROCARDIA-XL/ADALAT-CC/NIFEDICAL-XL) 30 MG 24 hr tablet Take 30 mg by mouth daily.    Marland Kitchen olmesartan (BENICAR) 20 MG tablet Take 1 tablet (20 mg total) by mouth daily. 90 tablet 4  . omega-3 acid ethyl esters (LOVAZA) 1 g capsule Take 1 g by mouth 2 (two) times daily.    Marland Kitchen oxyCODONE (OXY IR/ROXICODONE) 5 MG immediate release tablet Take 1-2 tablets (5-10 mg total) by mouth every 4 (four) hours as needed for moderate pain, severe pain or breakthrough pain. 120 tablet 0  . oxyCODONE (OXYCONTIN) 20 mg 12 hr tablet Take 2 tablets (40 mg total) by mouth every 12 (twelve) hours.  120 tablet 0  . polyethylene glycol (MIRALAX / GLYCOLAX) packet Take 17 g by mouth daily as needed for mild constipation. Reported on 01/06/2016    . Probiotic Product (PROBIOTIC DAILY PO) Take 1 capsule by mouth daily. [place]ina prochlorperazine (COMPAZINE) 10 MG tablet Take 1 tablet (10 mg total) by mouth every 6 (six) hours as needed (Nausea or vomiting). 30 tablet 1  . traZODone (DESYREL) 150 MG tablet Take 150 mg by mouth at bedtime.    Marland Kitchen zolpidem (AMBIEN) 5 MG tablet Take 1 tablet (5 mg total) by mouth at  bedtime as needed for sleep. 30 tablet 0   No current facility-administered medications for this visit.     OBJECTIVE: Middle-aged white woman Who appears stated age 2:   05/22/16 1345  BP: 116/73  Pulse: 100  Resp: 18  Temp: 98.8 F (37.1 C)     Body mass index is 28.2 kg/m.    ECOG FS:1 - Symptomatic but completely ambulatory  Sclerae unicteric, EOMs intact Oropharynx clear and moist No cervical or supraclavicular adenopathy Lungs no rales or rhonchi Heart regular rate and rhythm Abd soft, nontender, positive bowel sounds MSK no focal spinal tenderness, no upper extremity lymphedema Neuro: nonfocal, well oriented, appropriate affect Breasts: The right breast is unremarkable. The left breast is status post mastectomy with expander in place. It is hyperpigmented and the skin over it is very firm. The left axilla is benign.   LAB RESULTS:  CMP     Component Value Date/Time   NA 140 05/08/2016 1218   K 4.0 05/08/2016 1218   CL 110 02/15/2016 0330   CO2 25 05/08/2016 1218   GLUCOSE 91 05/08/2016 1218   BUN 10.5 05/08/2016 1218   CREATININE 0.8 05/08/2016 1218   CALCIUM 9.9 05/08/2016 1218   PROT 7.3 05/08/2016 1218   ALBUMIN 3.7 05/08/2016 1218   AST 20 05/08/2016 1218   ALT 17 05/08/2016 1218   ALKPHOS 60 05/08/2016 1218   BILITOT <0.30 05/08/2016 1218   GFRNONAA >60 02/15/2016 0330   GFRAA >60 02/15/2016 0330     INo results found for: SPEP, UPEP  Lab Results  Component Value Date   WBC 5.2 05/22/2016   NEUTROABS 3.5 05/22/2016   HGB 12.2 05/22/2016   HCT 36.5 05/22/2016   MCV 91.3 05/22/2016   PLT 327 05/22/2016      Chemistry      Component Value Date/Time   NA 140 05/08/2016 1218   K 4.0 05/08/2016 1218   CL 110 02/15/2016 0330   CO2 25 05/08/2016 1218   BUN 10.5 05/08/2016 1218   CREATININE 0.8 05/08/2016 1218      Component Value Date/Time   CALCIUM 9.9 05/08/2016 1218   ALKPHOS 60 05/08/2016 1218   AST 20 05/08/2016 1218   ALT 17 05/08/2016 1218   BILITOT <0.30 05/08/2016 1218       No results found for: LABCA2  No components found for: LABCA125  No results for input(s): INR in the last 168 hours.  Urinalysis    Component Value Date/Time   COLORURINE YELLOW 11/12/2015 [place] 11/12/2015 0837   LABSPEC 1.005 05/15/2016 1405   PHURINE 6.0 05/15/2016 1405   PHURINE 6.5 11/12/2015 0837   GLUCOSEU Negative 05/15/2016 1405   HGBUR Small 05/15/2016 1405   HGBUR MODERATE (A) 11/12/2015 0837   BILIRUBINUR Negative 05/15/2016 1405   KETONESUR Negative 05/15/2016 [place] 11/12/2015 0837   PROTEINUR Negative 05/15/2016 1405   PROTEINUR NEGATIVE 11/12/2015 0837   UROBILINOGEN 0.2 05/15/2016 1405   NITRITE Negative 05/15/2016 1405   NITRITE NEGATIVE 11/12/2015 0837   LEUKOCYTESUR Moderate 05/15/2016 1405    STUDIES: No results found. PROTOCOLS: B=WELL, ABC and PALLAS   ASSESSMENT: 55 y.o.  woman status post 2 separate masses in the left breast upper outer quadrant 08/04/2015, clinically mT2 N1, stage IIb/IIIa invasive ductal carcinoma, grade 3, the larger mass being estrogen and progesterone receptor positive, HER-2 negative, with an MIB-1 of 12%  (a) biopsy of a left axillary lymph node 08/09/2015 was positive,  with extracapsular extension  (1) neoadjuvant chemotherapy started 08/25/2015 consisting of doxorubicin  and cyclophosphamide in dose dense fashion 4, completed 10/06/2015,  followed by weekly paclitaxel 10 completed 12/23/2015  (a) final 2 planned cycles of weekly paclitaxel omitted because of neuropathy concerns  (2) status post left mastectomy with targeted axillary dissection 02/02/2016 for a ypT2 ypN2a, stage IIIa invasive ductal carcinoma, grade 2, with negative margins, repeat prognostic panel showing estrogen receptor positive, but progesterone receptor and HER-2 negative  (a) completion axillary lymph node dissection 02/14/2016 showed an additional 2 out of 8 lymph nodes to be involved (final count 6 out of 10 lymph nodes positive  (b) expander placement at the time of left mastectomy  (3) adjuvant capecitabine starting concurrently with radiation--discontinued 05/21/2016  (4) adjuvant  radiation started 04/02/2016, completed 05/21/2016  (5) anastrozole to follow completion of local treatment  (a) bone density August 2016 showed osteopenia  (b) the patient is status post total abdominal hysterectomy with bilateral salpingo-oophorectomy.  (6) postoperative pain: Starting OxyContin 20 mg twice a day with oxycodone as needed for breakthrough pain 03/02/2016    PLAN: Alison [name] has finally completed her local treatment and is ready to start systemic therapy. This will be anastrozole. We discussed the possible toxicities, side effects and complications of this agent, which however we do not intend for her to start until 06/27/2016. My hope is that by then she will have sufficiently recovered from her radiation treatments.  We discussed the various trials that she may be able to participate in. She is most interested in the PALLAS trial and she is being screened for that. She I think will also qualify for the be well trial and she is particularly interested in weight loss. She may also qualify for the ABC trial. The research nurses are looking into these possibilities and will be contacting the  patient is appropriate.  She is planning eventually to have a right mastectomy, to be done at the same time as her definitive implant placement on the left. Obviously this will be sometime towards the middle of next year  She will see me late November, by which time she should've been on anastrozole several weeks and we can do an initial assessment of tolerance. Of course if she intends to enroll in any of this studies I can see her earlier on an as-needed basis  .  Chauncey Cruel, MD   05/22/2016 2:34 PM

## 2016-05-23 ENCOUNTER — Ambulatory Visit: Payer: 59 | Attending: General Surgery | Admitting: Physical Therapy

## 2016-05-23 DIAGNOSIS — I890 Lymphedema, not elsewhere classified: Secondary | ICD-10-CM | POA: Insufficient documentation

## 2016-05-23 NOTE — Therapy (Signed)
[place], [place], [address] Phone: [phone]   Fax:  [phone]  Physical Therapy Evaluation  Patient Details  Name: Alison [name] MRN: [id number] Date of Birth: 1961-07-15 Referring Provider: Dr.Byerly   Encounter Date: 05/23/2016    Past Medical History:  Diagnosis Date  . Asthma     seasonal asthma  . Breast cancer ([place])   . Complication of anesthesia 2007   aspiration pneumonia post hysterectomy.  Blood pressure would drop low- but no problems 01/2016- surgery  . History of blood transfusion   . Hypertension   . 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 Osteopenia determined by x-ray   . Pneumonia    1980's and 1990's  . Raynaud's disease   . Rectovaginal fistula    repaired 2014  . Skin cancer 2014   basal- back    Past Surgical History:  Procedure Laterality Date  . ABDOMINAL HYSTERECTOMY  2007   with BSO  . APPENDECTOMY  2007  . AXILLARY LYMPH NODE DISSECTION Left 02/14/2016  . AXILLARY LYMPH NODE DISSECTION Left 02/14/2016   Procedure: LEFT AXILLARY LYMPH NODE DISSECTION;  Surgeon: [name], MD;  Location: [facility];  Service: General;  Laterality: Left;  . BREAST RECONSTRUCTION WITH PLACEMENT OF TISSUE EXPANDER AND FLEX HD (ACELLULAR HYDRATED DERMIS) Left 02/02/2016   Procedure: IMMEDIATE LEFT BREAST RECONSTRUCTION WITH PLACEMENT OF TISSUE EXPANDER AND FLEX HD (ACELLULAR HYDRATED DERMIS);  Surgeon: [name], DO;  Location: [facility];  Service: Plastics;  Laterality: Left;  . BREAST SURGERY Left 02/02/16   mastectomy with reconstruction  . c-section    . COLON RESECTION  4/'2014  . HERNIA REPAIR    . Port- a- cath placement  07/2015  . PORT-A-CATH REMOVAL Right 02/02/2016   Procedure: REMOVAL PORT-A-CATH;  Surgeon: [name], MD;  Location: [facility];  Service: General;  Laterality: Right;  . RADIOACTIVE SEED GUIDED AXILLARY SENTINEL LYMPH  NODE Left 02/02/2016   Procedure: LEFT BREAST MASTECTOMY AND RADIOACTIVE SEED GUIDED LEFT SENTINEL LYMPH NODE BIOPSY ;  Surgeon: [name], MD;  Location: [facility];  Service: General;  Laterality: Left;    There were no vitals filed for this visit.                                      [facility] Goals - 04/25/16 [phone]      CC Long Term Goal  #1   Title Patient will report a decrease in pain by 50% so they can perform daily activities with greater ease   Status Achieved     CC Long Term Goal  #2   Title Patient will improve shoulder flexion range of motion to 150 degrees to perform activities of daily living and household chores with greater ease   Status Achieved     CC Long Term Goal  #3   Title Patient with verbalize an understanding of lymphedema risk reduction precautions   Status Achieved     CC Long Term Goal  #4   Title Patient will be independent in self manual lymph drainage   Baseline pt is not able to tolerate manual lymph draiange at this time    Status Not Met     CC Long Term Goal  #5   Title Pt will report her edema symptoms in upper  body are about 25% better    Status Not Met          Patient will benefit from skilled therapeutic intervention in order to improve the following deficits and impairments:     Visit Diagnosis: Lymphedema, not elsewhere classified     Problem List Patient Active Problem List   Diagnosis Date Noted  . Primary malignant neoplasm of left breast with stage 2 nodal metastasis per American Joint Committee on Cancer 7th edition (N2) ([place]) 02/14/2016  . Breast cancer ([place]) 02/02/2016  . 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 Insomnia 09/01/2015  . Breast cancer of upper-outer quadrant of left female breast ([place]) 08/08/2015    Danny Zimny 05/23/2016, 1:11 PM  [facility], [place], [address] Phone: [phone]   Fax:  [phone]  Name: MONTZERRAT BRUNELL MRN: [id number] Date of Birth: 1960/11/23   Evaluation was postponed today because patient was "just exhausted" and wasn't planning to do treatment for another couple of weeks.  She will call back to schedule when she is feeling better.  Serafina Royals, PT 05/23/16 1:13 PM

## 2016-05-24 ENCOUNTER — Telehealth: Payer: Self-pay | Admitting: *Deleted

## 2016-05-24 ENCOUNTER — Other Ambulatory Visit: Payer: Self-pay | Admitting: *Deleted

## 2016-05-24 MED ORDER — OXYCODONE HCL 5 MG PO TABS: 5.0000 mg | ORAL_TABLET | ORAL | 0 refills | Status: DC | PRN

## 2016-05-24 MED ORDER — OXYCODONE HCL ER 20 MG PO T12A: 40.0000 mg | EXTENDED_RELEASE_TABLET | Freq: Two times a day (BID) | ORAL | 0 refills | Status: DC

## 2016-05-24 NOTE — Telephone Encounter (Signed)
  Oncology Nurse Navigator Documentation  Navigator Location: [facility] (05/24/16 1600) Navigator Encounter Type: Telephone (05/24/16 1600) Telephone: Outgoing Call (05/24/16 1600) Abnormal Finding Date: 10/19/14 (05/24/16 1600)   Surgery Date: 02/02/16 (05/24/16 1600) Treatment Initiated Date: 02/02/16 (05/24/16 1600) Patient Visit Type: [id number] (05/24/16 1600) Treatment Phase: Final Radiation Tx (05/24/16 1600)                            Time Spent with Patient: 15 (05/24/16 1600)

## 2016-05-25 NOTE — Progress Notes (Signed)
  Radiation Oncology         [phone] ________________________________  Name: KALISTA LAGUARDIA MRN: [id number]  Date: 05/21/2016  DOB: 05-Nov-1960  End of Treatment Note  DIAGNOSIS:     ICD-9-CM ICD-10-CM   1. Breast cancer of upper-outer quadrant of left female breast ([place]) 174.4 C50.412    ypT3 ypN2a, M0, Stage III  invasive ductal carcinoma (ER positive, PR negative, HER2 negative) of the left breast  Indication for treatment:  curative       Radiation treatment dates:   04/02/2016-05/21/2016  Site/dose:   1) Left Chest Wall, SCV, Axilla, IM nodes / 50.4Gy in 28 fractions (IM nodes receiving 45Gy+) ; 2) Left Chest wall scar boost / 10 Gy in 5 fractions  Beams/energy:   1) 3D / 10, 6X ; 2) Electrons / 6MeV  Narrative: The patient tolerated radiation treatment relatively well.   She did require a short break due to moist desquamation.  Plan: The patient has completed radiation treatment. The patient will return to radiation oncology clinic for routine followup in one month. I advised them to call or return sooner if they have any questions or concerns related to their recovery or treatment.  -----------------------------------  Eppie Gibson, MD

## 2016-05-29 NOTE — Progress Notes (Signed)
[employer] representative Note  Diagnosis: Breast Cancer Breast cancer of upper-outer quadrant of left female breast ([place]) 174.4 C50.412    Outpatient   The patient's CT images from her initial simulation were reviewed to plan her boost treatment to her left chest wall scar.  Measurements were made regarding the size and depth of the surgical bed. The boost to the lumpectomy cavity will be delivered with 6 MeV electrons; 10 Gy in 5 fractions has been prescribed to the 95% isodose line.  0.8cm bolus daily.  A special port plan was reviewed and approved.  A custom electron cut-out will be used for her boost field.    -----------------------------------  Eppie Gibson, MD

## 2016-05-30 ENCOUNTER — Other Ambulatory Visit: Payer: Self-pay | Admitting: Oncology

## 2016-05-30 DIAGNOSIS — C779 Secondary and unspecified malignant neoplasm of lymph node, unspecified: Principal | ICD-10-CM

## 2016-05-30 DIAGNOSIS — C50912 Malignant neoplasm of unspecified site of left female breast: Secondary | ICD-10-CM

## 2016-06-04 ENCOUNTER — Other Ambulatory Visit: Payer: Self-pay | Admitting: *Deleted

## 2016-06-04 DIAGNOSIS — C50412 Malignant neoplasm of upper-outer quadrant of left female breast: Secondary | ICD-10-CM

## 2016-06-04 MED ORDER — ZOLPIDEM TARTRATE 5 MG PO TABS: 5.0000 mg | ORAL_TABLET | Freq: Every evening | ORAL | 0 refills | Status: DC | PRN

## 2016-06-04 NOTE — Telephone Encounter (Signed)
Pt called request for Ambien refill.  Pt advised Pharmacy has sent multiple request with no response. Reviewed med list, called in rx for Ambien 5mg  to pt pharmacy. No further concerns.

## 2016-06-06 ENCOUNTER — Ambulatory Visit: Payer: 59 | Attending: General Surgery | Admitting: Physical Therapy

## 2016-06-06 DIAGNOSIS — R293 Abnormal posture: Secondary | ICD-10-CM | POA: Diagnosis present

## 2016-06-06 DIAGNOSIS — I890 Lymphedema, not elsewhere classified: Secondary | ICD-10-CM | POA: Insufficient documentation

## 2016-06-06 DIAGNOSIS — M25512 Pain in left shoulder: Secondary | ICD-10-CM | POA: Diagnosis present

## 2016-06-06 DIAGNOSIS — M79601 Pain in right arm: Secondary | ICD-10-CM | POA: Insufficient documentation

## 2016-06-06 NOTE — Therapy (Signed)
[place], [place], [address] Phone: [phone]   Fax:  [phone]  Physical Therapy Evaluation  Patient Details  Name: Alison [name] MRN: [id number] Date of Birth: 28-Oct-1960 Referring Provider: [name], MD  Encounter Date: 06/06/2016      PT End of Session - 06/06/16 1652    Visit Number 1   Number of Visits 9   Date for PT Re-Evaluation 07/06/16   PT Start Time [id number]   PT Stop Time 1600   PT Time Calculation (min) 37 min   Activity Tolerance Patient tolerated treatment well   Behavior During Therapy [facility] for tasks assessed/performed      Past Medical History:  Diagnosis Date  . Asthma     seasonal asthma  . Breast cancer ([place])   . Complication of anesthesia 2007   aspiration pneumonia post hysterectomy.  Blood pressure would drop low- but no problems 01/2016- surgery  . History of blood transfusion   . Hypertension   . Neuropathy ([place])    with chemo  . Osteopenia determined by x-ray   . Pneumonia    1980's and 1990's  . Raynaud's disease   . Rectovaginal fistula    repaired 2014  . Skin cancer 2014   basal- back    Past Surgical History:  Procedure Laterality Date  . ABDOMINAL HYSTERECTOMY  2007   with BSO  . APPENDECTOMY  2007  . AXILLARY LYMPH NODE DISSECTION Left 02/14/2016  . AXILLARY LYMPH NODE DISSECTION Left 02/14/2016   Procedure: LEFT AXILLARY LYMPH NODE DISSECTION;  Surgeon: [name], MD;  Location: [place];  Service: General;  Laterality: Left;  . BREAST RECONSTRUCTION WITH PLACEMENT OF TISSUE EXPANDER AND FLEX HD (ACELLULAR HYDRATED DERMIS) Left 02/02/2016   Procedure: IMMEDIATE LEFT BREAST RECONSTRUCTION WITH PLACEMENT OF TISSUE EXPANDER AND FLEX HD (ACELLULAR HYDRATED DERMIS);  Surgeon: [name], DO;  Location: [place];  Service: Plastics;  Laterality: Left;  . BREAST SURGERY Left 02/02/16   mastectomy with reconstruction  .  c-section    . COLON RESECTION  4/'2014  . HERNIA REPAIR    . Port- a- cath placement  07/2015  . PORT-A-CATH REMOVAL Right 02/02/2016   Procedure: REMOVAL PORT-A-CATH;  Surgeon: [name], MD;  Location: [place];  Service: General;  Laterality: Right;  . RADIOACTIVE SEED GUIDED AXILLARY SENTINEL LYMPH NODE Left 02/02/2016   Procedure: LEFT BREAST MASTECTOMY AND RADIOACTIVE SEED GUIDED LEFT SENTINEL LYMPH NODE BIOPSY ;  Surgeon: [name], MD;  Location: [place];  Service: General;  Laterality: Left;    There were no vitals filed for this visit.       Subjective Assessment - 06/06/16 1528    Subjective Reports "weird pains" in right arm and right leg that moves; a couple of days ago, it was so bad that it brought tears to her eyes. It is similar pain to the peripheral neuropathy that she had during chemotherapy. She still has complaints of swelling in her neck and left arm.   Pertinent History Breast cancer diagnosed November2016 She had chemotherapy. but did have some neuropathy so it had to be stopped. On June 8, she had a mastectomy with immedicate expander placement and with first set of lymphnodes removed, On June 20 she went for complete axillary lymph node dissection. She says she has had always been "full necked" but has neck and upper body fullness since the cancer was diagnosed. She  reports she has osteopenia in low back and has had a fracture that was discovered in 2014 when she had a Dexa scan.   Patient Stated Goals to decrease the edema   Currently in Pain? Yes   Pain Score 6    Pain Location Shoulder  left side of neck and left axilla   Pain Orientation Left   Pain Descriptors / Indicators Sore;Aching   Pain Type Surgical pain   Pain Radiating Towards into left breast   Pain Onset More than a month ago   Pain Frequency Constant   Aggravating Factors  moving left arm   Pain Relieving Factors not moving arm, pain medication             [facility] PT Assessment - 06/06/16 0001      Assessment   Medical Diagnosis breast cancer   Referring Provider [name], MD   Onset Date/Surgical Date 07/03/15   Hand Dominance Right     Restrictions   Weight Bearing Restrictions No     Balance Screen   Has the patient fallen in the past 6 months No   Has the patient had a decrease in activity level because of a fear of falling?  No   Is the patient reluctant to leave their home because of a fear of falling?  No     Home Ecologist residence   Living Arrangements Spouse/significant other     Prior Function   Level of Independence Independent   Leisure have done a little walking, but after she does the pain is worse     Cognition   Overall Cognitive Status Within Functional Limits for tasks assessed     Observation/Other Assessments   Observations significant edema of anterior and lateral neck (bilaterally), noticeable fullness on left aspect of upper back   Skin Integrity area where previous rash and discoloration from radiation appears much improved today   Other Surveys  --  LLIS: 44% impairment     Posture/Postural Control   Posture/Postural Control Postural limitations   Postural Limitations Rounded Shoulders;Forward head     AROM   Right Shoulder Flexion 125 Degrees  to 154 if she pushes into the pain   Right Shoulder ABduction 99 Degrees  before pain starts   Left Shoulder Flexion 141 Degrees   Left Shoulder ABduction 160 Degrees           LYMPHEDEMA/ONCOLOGY QUESTIONNAIRE - 06/06/16 1539      Left Upper Extremity Lymphedema   15 cm Proximal to Olecranon Process 31.2 cm   10 cm Proximal to Olecranon Process 27 cm   Olecranon Process 23.4 cm   15 cm Proximal to Ulnar Styloid Process 22.6 cm   10 cm Proximal to Ulnar Styloid Process 19.6 cm   Just Proximal to Ulnar Styloid Process 15.1 cm   Across Hand at [employer] 17.9 cm   At [place] of 2nd Digit 5.4 cm      Head and Neck   4 cm superior to sternal notch around neck 54.2 cm   6 cm superior to sternal notch around neck 54.4 cm   8 cm superior to sternal notch around neck 53.6 cm                                Long Term Clinic Goals - 06/06/16 1658      CC Long Term Goal  #  1   Title Patient will report a decrease in left shoulder pain by 75% so she can perform daily activities with greater ease   Time 4   Period Weeks   Status New     CC Long Term Goal  #2   Title Reduce circumference measurement at 6 cm superior to sternal notch to 52 cm or less.   Baseline 54.4 at eval   Time 4   Period Weeks   Status New     CC Long Term Goal  #3   Title Patient will be independent in self manual lymph drainage.   Time 4   Period Weeks   Status New     CC Long Term Goal  #4   Title Patient will increase right shoulder flexion AROM to 140 degrees without any pain for improved ability to perform ADLs.   Time 4   Period Weeks   Status New     CC Long Term Goal  #5   Title Patient will utilize chip pack for at least 2 hours per day at home to assess whether compression garment would be beneficial.   Time 4   Period Weeks   Status New            Plan - 06/06/16 1653    Clinical Impression Statement Patient is known to this clinic, previously discharged about a month ago because of worsening rash and pain from radiation. She now returns to continue addressing lymphedema in her anterior and lateral neck. Circumference measurements are increased from last measurement on 04/17/16. She now reports pain in her right arm and right leg that seems to be getting worse. Right shoulder AROM measurements are limited today secondary to pain and significantly reduced from last measurements taken.    Rehab Potential Good   Clinical Impairments Affecting Rehab Potential chemotherapy with neuropathy    PT Frequency 2x / week   PT Duration 4 weeks   PT Treatment/Interventions  ADLs/Self Care Home Management;Manual lymph drainage;Compression bandaging;Scar mobilization;Passive range of motion;Patient/family education;Taping;Manual techniques;Therapeutic exercise;Therapeutic activities;Orthotic Fit/Training;DME Instruction   PT Next Visit Plan begin manual lymph drainage; possibly use kinesiotape if patient feels like this is helpful   Consulted and Agree with Plan of Care Patient      Patient will benefit from skilled therapeutic intervention in order to improve the following deficits and impairments:  Decreased range of motion, Impaired UE functional use, Decreased activity tolerance, Decreased knowledge of precautions, Decreased skin integrity, Impaired perceived functional ability, Pain, Decreased knowledge of use of DME, Increased edema, Postural dysfunction, Decreased strength, Impaired sensation, Decreased scar mobility  Visit Diagnosis: Lymphedema, not elsewhere classified  Left shoulder pain, unspecified chronicity  Pain in right arm  Abnormal posture     Problem List Patient Active Problem List   Diagnosis Date Noted  . Primary malignant neoplasm of left breast with stage 2 nodal metastasis per American Joint Committee on Cancer 7th edition (N2) ([place]) 02/14/2016  . Breast cancer ([place]) 02/02/2016  . 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 Insomnia 09/01/2015  . Breast cancer of upper-outer quadrant of left female breast ([place]) 08/08/2015    Mellody Life 06/06/2016, 5:17 PM  [place], [place], [address] Phone: [phone]   Fax:  [phone]  Name: RELENA MCADAM MRN: [id number] Date of Birth: 10/24/1960  Saverio Danker, SPT  .This entire session was guided, instructed, and directly supervised by Serafina Royals, PT.  Read, reviewed, edited  and agree with student's findings and recommendations.   Serafina Royals, PT 06/06/16 8:38  PM

## 2016-06-13 ENCOUNTER — Encounter: Payer: 59 | Admitting: *Deleted

## 2016-06-13 ENCOUNTER — Ambulatory Visit: Payer: 59 | Admitting: Physical Therapy

## 2016-06-13 ENCOUNTER — Telehealth: Payer: Self-pay

## 2016-06-13 DIAGNOSIS — M25512 Pain in left shoulder: Secondary | ICD-10-CM

## 2016-06-13 DIAGNOSIS — R293 Abnormal posture: Secondary | ICD-10-CM

## 2016-06-13 DIAGNOSIS — I890 Lymphedema, not elsewhere classified: Secondary | ICD-10-CM

## 2016-06-13 DIAGNOSIS — M79601 Pain in right arm: Secondary | ICD-10-CM

## 2016-06-13 DIAGNOSIS — Z170 Estrogen receptor positive status [ER+]: Principal | ICD-10-CM

## 2016-06-13 DIAGNOSIS — C50412 Malignant neoplasm of upper-outer quadrant of left female breast: Secondary | ICD-10-CM

## 2016-06-13 MED ORDER — OXYCODONE HCL 5 MG PO TABS: 5.0000 mg | ORAL_TABLET | ORAL | 0 refills | Status: DC | PRN

## 2016-06-13 NOTE — Telephone Encounter (Signed)
Pt called for oxycodone refill. rx prepared for signature. 

## 2016-06-13 NOTE — Progress Notes (Signed)
06/13/2016 See Research Consent form encounter for details related to today's visit. Cindy S. Brigitte Pulse BSN, RN, CCRP 06/13/2016 5:00 PM

## 2016-06-13 NOTE — Telephone Encounter (Signed)
lvm on home and cell that Rx ready for pickup

## 2016-06-13 NOTE — Therapy (Signed)
[place], [place], [address] Phone: [phone]   Fax:  [phone]  Physical Therapy Treatment  Patient Details  Name: Alison [name] MRN: [id number] Date of Birth: March 14, 1961 Referring Provider: [name], MD  Encounter Date: 06/13/2016      PT End of Session - 06/13/16 1231    Visit Number 2   Number of Visits 9   Date for PT Re-Evaluation 07/06/16   PT Start Time 1100   PT Stop Time 1155   PT Time Calculation (min) 55 min   Activity Tolerance Patient tolerated treatment well   Behavior During Therapy [facility] for tasks assessed/performed      Past Medical History:  Diagnosis Date  . Asthma     seasonal asthma  . Breast cancer ([place])   . Complication of anesthesia 2007   aspiration pneumonia post hysterectomy.  Blood pressure would drop low- but no problems 01/2016- surgery  . History of blood transfusion   . Hypertension   . Neuropathy ([place])    with chemo  . Osteopenia determined by x-ray   . Pneumonia    1980's and 1990's  . Raynaud's disease   . Rectovaginal fistula    repaired 2014  . Skin cancer 2014   basal- back    Past Surgical History:  Procedure Laterality Date  . ABDOMINAL HYSTERECTOMY  2007   with BSO  . APPENDECTOMY  2007  . AXILLARY LYMPH NODE DISSECTION Left 02/14/2016  . AXILLARY LYMPH NODE DISSECTION Left 02/14/2016   Procedure: LEFT AXILLARY LYMPH NODE DISSECTION;  Surgeon: [name], MD;  Location: [place];  Service: General;  Laterality: Left;  . BREAST RECONSTRUCTION WITH PLACEMENT OF TISSUE EXPANDER AND FLEX HD (ACELLULAR HYDRATED DERMIS) Left 02/02/2016   Procedure: IMMEDIATE LEFT BREAST RECONSTRUCTION WITH PLACEMENT OF TISSUE EXPANDER AND FLEX HD (ACELLULAR HYDRATED DERMIS);  Surgeon: [name], DO;  Location: [place];  Service: Plastics;  Laterality: Left;  . BREAST SURGERY Left 02/02/16   mastectomy with reconstruction  .  c-section    . COLON RESECTION  4/'2014  . HERNIA REPAIR    . Port- a- cath placement  07/2015  . PORT-A-CATH REMOVAL Right 02/02/2016   Procedure: REMOVAL PORT-A-CATH;  Surgeon: [name], MD;  Location: [place];  Service: General;  Laterality: Right;  . RADIOACTIVE SEED GUIDED AXILLARY SENTINEL LYMPH NODE Left 02/02/2016   Procedure: LEFT BREAST MASTECTOMY AND RADIOACTIVE SEED GUIDED LEFT SENTINEL LYMPH NODE BIOPSY ;  Surgeon: [name], MD;  Location: [place];  Service: General;  Laterality: Left;    There were no vitals filed for this visit.      Subjective Assessment - 06/13/16 1105    Subjective She is having pin in left arm, under her arm, neck, shoulder and also to the right side and low back  She is not doing any chemo or radiation any further treament.  She feels that she wants to get better so that she can get off the pain medicine.    Pertinent History Breast cancer diagnosed November2016 She had chemotherapy. but did have some neuropathy so it had to be stopped. On June 8, she had a mastectomy with immedicate expander placement and with first set of lymphnodes removed, On June 20 she went for complete axillary lymph node dissection. She says she has had always been "full necked" but has neck and upper body fullness since the cancer was diagnosed. She reports  she has osteopenia in low back and has had a fracture that was discovered in 2014 when she had a Dexa scan.   Patient Stated Goals to decrease the edema   Currently in Pain? Yes   Pain Score 7    Pain Location Arm   Pain Orientation Left   Pain Descriptors / Indicators Aching   Pain Type Chronic pain   Pain Radiating Towards arm, neck shoulder  eblow and wrist    Pain Onset More than a month ago   Pain Frequency Constant   Aggravating Factors  move her arm    Pain Relieving Factors not moving, pain medication                          [facility] Adult PT  Treatment/Exercise - 06/13/16 0001      Shoulder Exercises: Standing   Other Standing Exercises isometric shoulder flexion, abduction and extension x 5-10 reps      Manual Therapy   Manual Lymphatic Drainage (MLD) In sitting, superficial nodes, bilateral shoulder collectors, supraclavicualar areas to posterior neck, bilateral scapualar area toward back,  left  axilla and left upper arm. anterior throat to chest ,    Kinesiotex Edema     Kinesiotix   Edema skinkote, fan shaped from right supraclavicular areat to posterior axilla and left supraclavicular to back                 PT Education - 06/13/16 1237    Education provided Yes   Education Details isometric shoulder exercise in flexion, abduction and extension    Person(s) Educated Patient   Methods Explanation;Demonstration;Handout   Comprehension Verbalized understanding;Returned demonstration                [place] - 06/06/16 1658      CC Long Term Goal  #1   Title Patient will report a decrease in left shoulder pain by 75% so she can perform daily activities with greater ease   Time 4   Period Weeks   Status New     CC Long Term Goal  #2   Title Reduce circumference measurement at 6 cm superior to sternal notch to 52 cm or less.   Baseline 54.4 at eval   Time 4   Period Weeks   Status New     CC Long Term Goal  #3   Title Patient will be independent in self manual lymph drainage.   Time 4   Period Weeks   Status New     CC Long Term Goal  #4   Title Patient will increase right shoulder flexion AROM to 140 degrees without any pain for improved ability to perform ADLs.   Time 4   Period Weeks   Status New     CC Long Term Goal  #5   Title Patient will utilize chip pack for at least 2 hours per day at home to assess whether compression garment would be beneficial.   Time 4   Period Weeks   Status New            Plan - 06/13/16 1232    Clinical Impression Statement Pt  brought in her chip pack and appears to be wearing it correctly.  She  continues to have pain in left upper quadarant , but is also experiencing pain in right arm and back.  Pt reports that she gets relief from manual lymph drainage and  kinesiotape . Also reissued information about the flexitouch with Randell Loop' phone number if she is intrested in finding out insurance benefits and if she wants to try it.    Rehab Potential Good   Clinical Impairments Affecting Rehab Potential chemotherapy with neuropathy    PT Frequency 2x / week   PT Duration 4 weeks   PT Treatment/Interventions ADLs/Self Care Home Management;Manual lymph drainage;Compression bandaging;Scar mobilization;Passive range of motion;Patient/family education;Taping;Manual techniques;Therapeutic exercise;Therapeutic activities;Orthotic Fit/Training;DME Instruction   PT Next Visit Plan assess effect of isometric exercises continue  manual lymph drainage; possibly use kinesiotape if patient feels like this is helpful   Consulted and Agree with Plan of Care Patient      Patient will benefit from skilled therapeutic intervention in order to improve the following deficits and impairments:  Decreased range of motion, Impaired UE functional use, Decreased activity tolerance, Decreased knowledge of precautions, Decreased skin integrity, Impaired perceived functional ability, Pain, Decreased knowledge of use of DME, Increased edema, Postural dysfunction, Decreased strength, Impaired sensation, Decreased scar mobility  Visit Diagnosis: Lymphedema, not elsewhere classified  Left shoulder pain, unspecified chronicity  Pain in right arm  Abnormal posture     Problem List Patient Active Problem List   Diagnosis Date Noted  . Primary malignant neoplasm of left breast with stage 2 nodal metastasis per American Joint Committee on Cancer 7th edition (N2) ([place]) 02/14/2016  . Breast cancer ([place]) 02/02/2016  . Chemotherapy-induced neuropathy  ([place]) 12/30/2015  . Insomnia 09/01/2015  . Breast cancer of upper-outer quadrant of left female breast ([place]) 08/08/2015   Donato Heinz. Owens Shark PT   Norwood Levo 06/13/2016, 12:38 PM  [place], [place], [address] Phone: [phone]   Fax:  [phone]  Name: Alison [name] MRN: [id number] Date of Birth: May 30, 1961

## 2016-06-14 ENCOUNTER — Telehealth: Payer: Self-pay

## 2016-06-14 NOTE — Telephone Encounter (Signed)
Called and spoke with patient and she is aware of her mammogram appointment and I have mailed her the October and November calendars

## 2016-06-15 ENCOUNTER — Telehealth: Payer: Self-pay | Admitting: Oncology

## 2016-06-15 NOTE — Telephone Encounter (Signed)
Faxed pt records to [facility]  [phone]

## 2016-06-20 ENCOUNTER — Ambulatory Visit: Payer: 59 | Admitting: Physical Therapy

## 2016-06-20 DIAGNOSIS — I890 Lymphedema, not elsewhere classified: Secondary | ICD-10-CM

## 2016-06-20 DIAGNOSIS — R293 Abnormal posture: Secondary | ICD-10-CM

## 2016-06-20 DIAGNOSIS — M79601 Pain in right arm: Secondary | ICD-10-CM

## 2016-06-20 DIAGNOSIS — M25512 Pain in left shoulder: Secondary | ICD-10-CM

## 2016-06-20 NOTE — Therapy (Signed)
[place], [place], [address] Phone: [phone]   Fax:  [phone]  Physical Therapy Treatment  Patient Details  Name: Alison [name] MRN: [id number] Date of Birth: 06/12/1961 Referring Provider: [name], MD  Encounter Date: 06/20/2016      PT End of Session - 06/20/16 1234    Visit Number 3   Number of Visits 9   Date for PT Re-Evaluation 07/06/16   PT Start Time [id number]   PT Stop Time 1100   PT Time Calculation (min) 45 min   Activity Tolerance Patient tolerated treatment well   Behavior During Therapy [facility] for tasks assessed/performed      Past Medical History:  Diagnosis Date  . Asthma     seasonal asthma  . Breast cancer ([place])   . Complication of anesthesia 2007   aspiration pneumonia post hysterectomy.  Blood pressure would drop low- but no problems 01/2016- surgery  . History of blood transfusion   . Hypertension   . Neuropathy ([place])    with chemo  . Osteopenia determined by x-ray   . Pneumonia    1980's and 1990's  . Raynaud's disease   . Rectovaginal fistula    repaired 2014  . Skin cancer 2014   basal- back    Past Surgical History:  Procedure Laterality Date  . ABDOMINAL HYSTERECTOMY  2007   with BSO  . APPENDECTOMY  2007  . AXILLARY LYMPH NODE DISSECTION Left 02/14/2016  . AXILLARY LYMPH NODE DISSECTION Left 02/14/2016   Procedure: LEFT AXILLARY LYMPH NODE DISSECTION;  Surgeon: [name], MD;  Location: [facility];  Service: General;  Laterality: Left;  . BREAST RECONSTRUCTION WITH PLACEMENT OF TISSUE EXPANDER AND FLEX HD (ACELLULAR HYDRATED DERMIS) Left 02/02/2016   Procedure: IMMEDIATE LEFT BREAST RECONSTRUCTION WITH PLACEMENT OF TISSUE EXPANDER AND FLEX HD (ACELLULAR HYDRATED DERMIS);  Surgeon: [name], DO;  Location: [place];  Service: Plastics;  Laterality: Left;  . BREAST SURGERY Left 02/02/16   mastectomy with reconstruction  .  c-section    . COLON RESECTION  4/'2014  . HERNIA REPAIR    . Port- a- cath placement  07/2015  . PORT-A-CATH REMOVAL Right 02/02/2016   Procedure: REMOVAL PORT-A-CATH;  Surgeon: [name], MD;  Location: [facility];  Service: General;  Laterality: Right;  . RADIOACTIVE SEED GUIDED AXILLARY SENTINEL LYMPH NODE Left 02/02/2016   Procedure: LEFT BREAST MASTECTOMY AND RADIOACTIVE SEED GUIDED LEFT SENTINEL LYMPH NODE BIOPSY ;  Surgeon: [name], MD;  Location: [facility];  Service: General;  Laterality: Left;    There were no vitals filed for this visit.      Subjective Assessment - 06/20/16 1030    Subjective Patient had a good weekend at the beach and did alot of walking. She felt better at the end of the weekend.  She will be participating in a study for weight management through the cancer center    Pertinent History Breast cancer diagnosed November2016 She had chemotherapy. but did have some neuropathy so it had to be stopped. On June 8, she had a mastectomy with immedicate expander placement and with first set of lymphnodes removed, On June 20 she went for complete axillary lymph node dissection. She says she has had always been "full necked" but has neck and upper body fullness since the cancer was diagnosed. She reports she has osteopenia in low back and has had a fracture that was discovered  in 2014 when she had a Dexa scan.   Patient Stated Goals to decrease the edema   Currently in Pain? No/denies  just feels tired                          [facility] Adult PT Treatment/Exercise - 06/20/16 0001      Self-Care   Other Self-Care Comments  chip pack to left axilla to try to decrease fullness      Manual Therapy   Manual Lymphatic Drainage (MLD) In sitting, superficial nodes, bilateral shoulder collectors, supraclavicualar areas to posterior neck, bilateral scapualar area toward back. anterior throat to chest ,  then to supine for superficial  and deep abdominals, left inguinal nodes, axillo-inguinal anastamosis, extra time spent on left axilla where fullness in evident    Kinesiotex Edema     Kinesiotix   Edema skinkote and kinesiotape in fan shape to left axilla going toward abdomen                         Long Term Clinic Goals - 06/06/16 1658      CC Long Term Goal  #1   Title Patient will report a decrease in left shoulder pain by 75% so she can perform daily activities with greater ease   Time 4   Period Weeks   Status New     CC Long Term Goal  #2   Title Reduce circumference measurement at 6 cm superior to sternal notch to 52 cm or less.   Baseline 54.4 at eval   Time 4   Period Weeks   Status New     CC Long Term Goal  #3   Title Patient will be independent in self manual lymph drainage.   Time 4   Period Weeks   Status New     CC Long Term Goal  #4   Title Patient will increase right shoulder flexion AROM to 140 degrees without any pain for improved ability to perform ADLs.   Time 4   Period Weeks   Status New     CC Long Term Goal  #5   Title Patient will utilize chip pack for at least 2 hours per day at home to assess whether compression garment would be beneficial.   Time 4   Period Weeks   Status New            Plan - 06/20/16 1236    Clinical Impression Statement Pt continues to have edema in throat, posterior neck chest, abdomen and had fullness and soreness in left axilla that did not soften significantly with manual lymph drainage.  She will try kinesiotape and chip pack to see if she gets improvemnt in this area.    Rehab Potential Good   Clinical Impairments Affecting Rehab Potential chemotherapy with neuropathy    PT Frequency 2x / week   PT Duration 4 weeks   PT Treatment/Interventions ADLs/Self Care Home Management;Manual lymph drainage;Compression bandaging;Scar mobilization;Passive range of motion;Patient/family education;Taping;Manual techniques;Therapeutic  exercise;Therapeutic activities;Orthotic Fit/Training;DME Instruction   PT Next Visit Plan check to see if pt got relief from compression and ktape to axilla, continue with manual lymph drainage and begin to add exercise  assess goals   Consulted and Agree with Plan of Care Patient      Patient will benefit from skilled therapeutic intervention in order to improve the following deficits and impairments:  Decreased range of motion,  Impaired UE functional use, Decreased activity tolerance, Decreased knowledge of precautions, Decreased skin integrity, Impaired perceived functional ability, Pain, Decreased knowledge of use of DME, Increased edema, Postural dysfunction, Decreased strength, Impaired sensation, Decreased scar mobility  Visit Diagnosis: Lymphedema, not elsewhere classified  Left shoulder pain, unspecified chronicity  Pain in right arm  Abnormal posture     Problem List Patient Active Problem List   Diagnosis Date Noted  . Primary malignant neoplasm of left breast with stage 2 nodal metastasis per American Joint Committee on Cancer 7th edition (N2) ([place]) 02/14/2016  . Breast cancer ([place]) 02/02/2016  . Chemotherapy-induced neuropathy ([place]) 12/30/2015  . Insomnia 09/01/2015  . Breast cancer of upper-outer quadrant of left female breast ([place]) 08/08/2015   Donato Heinz. Owens Shark PT  Norwood Levo 06/20/2016, 12:41 PM  [place], [place], [address] Phone: [phone]   Fax:  [phone]  Name: JULEANNA ISOM MRN: [id number] Date of Birth: 09-15-1960

## 2016-06-21 ENCOUNTER — Encounter: Payer: Self-pay | Admitting: Radiation Oncology

## 2016-06-26 ENCOUNTER — Ambulatory Visit
Admission: RE | Admit: 2016-06-26 | Discharge: 2016-06-26 | Disposition: A | Discharge status: Home / Self Care | Payer: 59 | Source: Ambulatory Visit | Attending: Oncology | Admitting: Oncology

## 2016-06-26 ENCOUNTER — Ambulatory Visit: Payer: 59 | Admitting: Physical Therapy

## 2016-06-26 DIAGNOSIS — M79601 Pain in right arm: Secondary | ICD-10-CM

## 2016-06-26 DIAGNOSIS — C50912 Malignant neoplasm of unspecified site of left female breast: Secondary | ICD-10-CM

## 2016-06-26 DIAGNOSIS — R293 Abnormal posture: Secondary | ICD-10-CM

## 2016-06-26 DIAGNOSIS — I890 Lymphedema, not elsewhere classified: Secondary | ICD-10-CM

## 2016-06-26 DIAGNOSIS — C779 Secondary and unspecified malignant neoplasm of lymph node, unspecified: Principal | ICD-10-CM

## 2016-06-26 DIAGNOSIS — M25512 Pain in left shoulder: Secondary | ICD-10-CM

## 2016-06-26 NOTE — Therapy (Signed)
[place], [place], [address] Phone: [phone]   Fax:  [phone]  Physical Therapy Treatment  Patient Details  Name: Alison [name] MRN: [id number] Date of Birth: Sep 22, 1960 Referring Provider: [name], MD  Encounter Date: 06/26/2016      PT End of Session - 06/26/16 1728    Visit Number 4   Number of Visits 9   Date for PT Re-Evaluation 07/06/16   PT Start Time [id number]   PT Stop Time 1430   PT Time Calculation (min) 45 min   Activity Tolerance Patient tolerated treatment well   Behavior During Therapy [facility] for tasks assessed/performed      Past Medical History:  Diagnosis Date  . Asthma     seasonal asthma  . Breast cancer ([place])   . Complication of anesthesia 2007   aspiration pneumonia post hysterectomy.  Blood pressure would drop low- but no problems 01/2016- surgery  . History of blood transfusion   . History of radiation therapy 04/02/16- 05/21/16   Left Chest Wall, SCV, Axilla, IM nodes  . Hypertension   . Neuropathy ([place])    with chemo  . Osteopenia determined by x-ray   . Pneumonia    1980's and 1990's  . Raynaud's disease   . Rectovaginal fistula    repaired 2014  . Skin cancer 2014   basal- back    Past Surgical History:  Procedure Laterality Date  . ABDOMINAL HYSTERECTOMY  2007   with BSO  . APPENDECTOMY  2007  . AXILLARY LYMPH NODE DISSECTION Left 02/14/2016  . AXILLARY LYMPH NODE DISSECTION Left 02/14/2016   Procedure: LEFT AXILLARY LYMPH NODE DISSECTION;  Surgeon: [name], MD;  Location: [facility];  Service: General;  Laterality: Left;  . BREAST RECONSTRUCTION WITH PLACEMENT OF TISSUE EXPANDER AND FLEX HD (ACELLULAR HYDRATED DERMIS) Left 02/02/2016   Procedure: IMMEDIATE LEFT BREAST RECONSTRUCTION WITH PLACEMENT OF TISSUE EXPANDER AND FLEX HD (ACELLULAR HYDRATED DERMIS);  Surgeon: [name], DO;  Location: [facility];  Service: Plastics;   Laterality: Left;  . BREAST SURGERY Left 02/02/16   mastectomy with reconstruction  . c-section    . COLON RESECTION  4/'2014  . HERNIA REPAIR    . Port- a- cath placement  07/2015  . PORT-A-CATH REMOVAL Right 02/02/2016   Procedure: REMOVAL PORT-A-CATH;  Surgeon: [name], MD;  Location: [facility];  Service: General;  Laterality: Right;  . RADIOACTIVE SEED GUIDED AXILLARY SENTINEL LYMPH NODE Left 02/02/2016   Procedure: LEFT BREAST MASTECTOMY AND RADIOACTIVE SEED GUIDED LEFT SENTINEL LYMPH NODE BIOPSY ;  Surgeon: [name], MD;  Location: [facility];  Service: General;  Laterality: Left;    There were no vitals filed for this visit.      Subjective Assessment - 06/26/16 1353    Subjective "I'm tired today"  She has increased her activity.  She is walking 30 minutes a couple of times a week and wants to increase to once a week.  She has gotten some benefit from the chip pack    Pertinent History Breast cancer diagnosed November2016 She had chemotherapy. but did have some neuropathy so it had to be stopped. On June 8, she had a mastectomy with immedicate expander placement and with first set of lymphnodes removed, On June 20 she went for complete axillary lymph node dissection. She says she has had always been "full necked" but has neck and upper body fullness since the  cancer was diagnosed. She reports she has osteopenia in low back and has had a fracture that was discovered in 2014 when she had a Dexa scan.   Currently in Pain? Yes   Pain Score 3    Pain Location Shoulder   Pain Orientation Left;Right   Pain Descriptors / Indicators Aching   Pain Type Chronic pain   Pain Frequency Constant  in the left , gets work during  the day             [facility] PT Assessment - 06/26/16 0001      AROM   Right Shoulder Flexion 140 Degrees           LYMPHEDEMA/ONCOLOGY QUESTIONNAIRE - 06/26/16 1401      Head and Neck   6 cm superior to sternal notch  around neck 52 cm                  [facility] Adult PT Treatment/Exercise - 06/26/16 0001      Self-Care   Other Self-Care Comments  added extra chips to chip pack to left axilla to try to decrease fullness      Manual Therapy   Manual Lymphatic Drainage (MLD) In sitting, superficial nodes, bilateral shoulder collectors, supraclavicualar areas to posterior neck, bilateral scapualar area toward back. anterior throat to chest ,  then to supine for superficial and deep abdominals, left inguinal nodes, axillo-inguinal anastamosis, extra time spent on left axilla where fullness in evident    Kinesiotex Edema     Kinesiotix   Edema skinkote and kinesiotape in fan shape to left axilla going toward abdomen                         Long Term Clinic Goals - 06/26/16 1359      CC Long Term Goal  #1   Title Patient will report a decrease in left shoulder pain by 75% so she can perform daily activities with greater ease   Status On-going     CC Long Term Goal  #2   Title Reduce circumference measurement at 6 cm superior to sternal notch to 50 cm or less.   Baseline 54.4 at eval, 52 cm at 06/26/2016    Status Revised     CC Long Term Goal  #3   Title Patient will be independent in self manual lymph drainage.   Status Achieved     CC Long Term Goal  #4   Title Patient will increase right shoulder flexion AROM to 140 degrees without any pain for improved ability to perform ADLs.   Baseline 145 on 10/31   Status Achieved     CC Long Term Goal  #5   Title Patient will utilize chip pack for at least 2 hours per day at home to assess whether compression garment would be beneficial.   Baseline has been wearing it and so compression garment may be benficial    Status Achieved     CC Long Term Goal  #6   Title Pt will be independent in strenthening program    Time 2   Period Weeks   Status New     Additional Goals   Additional Goals Yes            Plan - 06/26/16  1730    Clinical Impression Statement Pt is making progress and has achieved several goals. receiving relief from compression and ktape to axilla and increased wearing time  of chip pack  Revised goal to decrease circumference of neck and added goal for exercise.  disussed possibility of compression tshirt that will go higher up on her neck in the back and body to help with edema control    Rehab Potential Good   Clinical Impairments Affecting Rehab Potential chemotherapy with neuropathy    PT Frequency 2x / week   PT Duration 4 weeks   PT Treatment/Interventions ADLs/Self Care Home Management;Manual lymph drainage;Compression bandaging;Scar mobilization;Passive range of motion;Patient/family education;Taping;Manual techniques;Therapeutic exercise;Therapeutic activities;Orthotic Fit/Training;DME Instruction   PT Next Visit Plan check to see if pt got relief from compression and ktape to axilla, continue with manual lymph drainage instruct in supine scap series and progress exercise    Consulted and Agree with Plan of Care Patient      Patient will benefit from skilled therapeutic intervention in order to improve the following deficits and impairments:  Decreased range of motion, Impaired UE functional use, Decreased activity tolerance, Decreased knowledge of precautions, Decreased skin integrity, Impaired perceived functional ability, Pain, Decreased knowledge of use of DME, Increased edema, Postural dysfunction, Decreased strength, Impaired sensation, Decreased scar mobility  Visit Diagnosis: Abnormal posture  Lymphedema, not elsewhere classified  Left shoulder pain, unspecified chronicity  Pain in right arm     Problem List Patient Active Problem List   Diagnosis Date Noted  . Primary malignant neoplasm of left breast with stage 2 nodal metastasis per American Joint Committee on Cancer 7th edition (N2) ([place]) 02/14/2016  . Breast cancer ([place]) 02/02/2016  . Chemotherapy-induced  neuropathy ([place]) 12/30/2015  . Insomnia 09/01/2015  . Breast cancer of upper-outer quadrant of left female breast ([place]) 08/08/2015   Donato Heinz. Owens Shark PT  Norwood Levo 06/26/2016, 5:36 PM  [place], [place], [address] Phone: [phone]   Fax:  [phone]  Name: EMMARAE DELAHUNTY MRN: [id number] Date of Birth: 1961-04-21

## 2016-06-28 ENCOUNTER — Ambulatory Visit: Payer: 59 | Attending: General Surgery | Admitting: Physical Therapy

## 2016-06-28 ENCOUNTER — Telehealth: Payer: Self-pay

## 2016-06-28 DIAGNOSIS — R221 Localized swelling, mass and lump, neck: Secondary | ICD-10-CM

## 2016-06-28 DIAGNOSIS — M79601 Pain in right arm: Secondary | ICD-10-CM | POA: Insufficient documentation

## 2016-06-28 DIAGNOSIS — I890 Lymphedema, not elsewhere classified: Secondary | ICD-10-CM | POA: Insufficient documentation

## 2016-06-28 DIAGNOSIS — R293 Abnormal posture: Secondary | ICD-10-CM | POA: Diagnosis not present

## 2016-06-28 DIAGNOSIS — M25512 Pain in left shoulder: Secondary | ICD-10-CM | POA: Diagnosis present

## 2016-06-28 DIAGNOSIS — M25612 Stiffness of left shoulder, not elsewhere classified: Secondary | ICD-10-CM | POA: Diagnosis present

## 2016-06-28 NOTE — Patient Instructions (Signed)
Over Head Pull: Narrow Grip And Wide Grip       On back, knees bent, feet flat, band across thighs, elbows straight but relaxed. Pull hands apart (start). Keeping elbows straight, bring arms up and over head, hands toward floor. Keep pull steady on band. Hold momentarily. Return slowly, keeping pull steady, back to start. Repeat 5-10___ times. Band color yellow______   Side Pull: Double Arm   On back, knees bent, feet flat. Arms perpendicular to body, shoulder level, elbows straight but relaxed. Pull arms out to sides, elbows straight. Resistance band comes across collarbones, hands toward floor. Hold momentarily. Slowly return to starting position. Repeat _5-10__ times. Band color _yellow____   Sash   On back, knees bent, feet flat, left hand on left hip, right hand above left. Pull right arm DIAGONALLY (hip to shoulder) across chest. Bring right arm along head toward floor. Hold momentarily. Slowly return to starting position. Repeat ___ times. Do with left arm. Band color ______   Shoulder Rotation: Double Arm   On back, knees bent, feet flat, elbows tucked at sides, bent 90, hands palms up. Pull hands apart and down toward floor, keeping elbows near sides. Hold momentarily. Slowly return to starting position. Repeat _5-10__ times. Band color yellow______

## 2016-06-28 NOTE — Therapy (Signed)
[place], [place], [address] Phone: [phone]   Fax:  [phone]  Physical Therapy Treatment  Patient Details  Name: Alison [name] MRN: [id number] Date of Birth: 11-Aug-1961 Referring Provider: [name], MD  Encounter Date: 06/28/2016      PT End of Session - 06/28/16 1733    Visit Number 5   Number of Visits 9   Date for PT Re-Evaluation 07/06/16   PT Start Time 1430   PT Stop Time 1515   PT Time Calculation (min) 45 min   Activity Tolerance Patient tolerated treatment well   Behavior During Therapy [facility] for tasks assessed/performed      Past Medical History:  Diagnosis Date  . Asthma     seasonal asthma  . Breast cancer ([place])   . Complication of anesthesia 2007   aspiration pneumonia post hysterectomy.  Blood pressure would drop low- but no problems 01/2016- surgery  . History of blood transfusion   . History of radiation therapy 04/02/16- 05/21/16   Left Chest Wall, SCV, Axilla, IM nodes  . Hypertension   . Neuropathy ([place])    with chemo  . Osteopenia determined by x-ray   . Pneumonia    1980's and 1990's  . Raynaud's disease   . Rectovaginal fistula    repaired 2014  . Skin cancer 2014   basal- back    Past Surgical History:  Procedure Laterality Date  . ABDOMINAL HYSTERECTOMY  2007   with BSO  . APPENDECTOMY  2007  . AXILLARY LYMPH NODE DISSECTION Left 02/14/2016  . AXILLARY LYMPH NODE DISSECTION Left 02/14/2016   Procedure: LEFT AXILLARY LYMPH NODE DISSECTION;  Surgeon: [name], MD;  Location: [place];  Service: General;  Laterality: Left;  . BREAST RECONSTRUCTION WITH PLACEMENT OF TISSUE EXPANDER AND FLEX HD (ACELLULAR HYDRATED DERMIS) Left 02/02/2016   Procedure: IMMEDIATE LEFT BREAST RECONSTRUCTION WITH PLACEMENT OF TISSUE EXPANDER AND FLEX HD (ACELLULAR HYDRATED DERMIS);  Surgeon: [name], DO;  Location: [place];  Service: Plastics;   Laterality: Left;  . BREAST SURGERY Left 02/02/16   mastectomy with reconstruction  . c-section    . COLON RESECTION  4/'2014  . HERNIA REPAIR    . Port- a- cath placement  07/2015  . PORT-A-CATH REMOVAL Right 02/02/2016   Procedure: REMOVAL PORT-A-CATH;  Surgeon: [name], MD;  Location: [place];  Service: General;  Laterality: Right;  . RADIOACTIVE SEED GUIDED AXILLARY SENTINEL LYMPH NODE Left 02/02/2016   Procedure: LEFT BREAST MASTECTOMY AND RADIOACTIVE SEED GUIDED LEFT SENTINEL LYMPH NODE BIOPSY ;  Surgeon: [name], MD;  Location: [place];  Service: General;  Laterality: Left;    There were no vitals filed for this visit.      Subjective Assessment - 06/28/16 1433    Subjective I'm tired today"  Pt states she had some pain earlier, but she took some pail pills and advil so she feels better.  She has had some pain at night that wakes her up from sleep.    Pertinent History Breast cancer diagnosed November2016 She had chemotherapy. but did have some neuropathy so it had to be stopped. On June 8, she had a mastectomy with immedicate expander placement and with first set of lymphnodes removed, On June 20 she went for complete axillary lymph node dissection. She says she has had always been "full necked" but has neck and upper body fullness since the cancer was  diagnosed. She reports she has osteopenia in low back and has had a fracture that was discovered in 2014 when she had a Dexa scan.   Patient Stated Goals to decrease the edema   Currently in Pain? No/denies  took pain med                          [facility] Adult PT Treatment/Exercise - 06/28/16 0001      Neck Exercises: Seated   Other Seated Exercise neck and upper thoracic range of motion      Shoulder Exercises: Supine   Horizontal ABduction Strengthening;Both;5 reps   Theraband Level (Shoulder Horizontal ABduction) Level 1 (Yellow)   External Rotation Strengthening;Both;5  reps   Theraband Level (Shoulder External Rotation) Level 1 (Yellow)   Flexion Strengthening;Both;5 reps  narrow and wide grip    Other Supine Exercises diagonal elevation with yellow theraband x 5 reps with each arm      Manual Therapy   Manual Lymphatic Drainage (MLD) in supine short neck, superficial and deep abdominals, right axillary nodes, anterior interaxillary anastamosis, anterior and posterior neck, then to right sidelying for posterior interaxillary anastamosis, and badk    Kinesiotex Edema     Kinesiotix   Edema skinkote and kinesiotape in fan shape to left back and lateral trunk going toward inguinal nodes                 PT Education - 06/28/16 1733    Education provided Yes   Education Details supine scapular series with yellow theraband    Person(s) Educated Patient   Methods Explanation;Demonstration;Handout   Comprehension Verbalized understanding;Returned demonstration                [facility] Goals - 06/26/16 1359      CC Long Term Goal  #1   Title Patient will report a decrease in left shoulder pain by 75% so she can perform daily activities with greater ease   Status On-going     CC Long Term Goal  #2   Title Reduce circumference measurement at 6 cm superior to sternal notch to 50 cm or less.   Baseline 54.4 at eval, 52 cm at 06/26/2016    Status Revised     CC Long Term Goal  #3   Title Patient will be independent in self manual lymph drainage.   Status Achieved     CC Long Term Goal  #4   Title Patient will increase right shoulder flexion AROM to 140 degrees without any pain for improved ability to perform ADLs.   Baseline 145 on 10/31   Status Achieved     CC Long Term Goal  #5   Title Patient will utilize chip pack for at least 2 hours per day at home to assess whether compression garment would be beneficial.   Baseline has been wearing it and so compression garment may be benficial    Status Achieved     CC Long Term  Goal  #6   Title Pt will be independent in strenthening program    Time 2   Period Weeks   Status New     Additional Goals   Additional Goals Yes            Plan - 06/28/16 1734    Clinical Impression Statement Pt to try a compression tshirt to see if it will improve truncal edema.  Upgraded program for range of motion and  strength exercise and encouraged patient to keep walking program daily    Rehab Potential Good   Clinical Impairments Affecting Rehab Potential chemotherapy with neuropathy    PT Frequency 2x / week   PT Duration 4 weeks   PT Next Visit Plan check to see if pt got relief from compression , continue with manual lymph drainage progress exercise    Consulted and Agree with Plan of Care Patient      Patient will benefit from skilled therapeutic intervention in order to improve the following deficits and impairments:  Decreased range of motion, Impaired UE functional use, Decreased activity tolerance, Decreased knowledge of precautions, Decreased skin integrity, Impaired perceived functional ability, Pain, Decreased knowledge of use of DME, Increased edema, Postural dysfunction, Decreased strength, Impaired sensation, Decreased scar mobility  Visit Diagnosis: Abnormal posture  Left shoulder pain, unspecified chronicity  Lymphedema, not elsewhere classified  Localized swelling, mass and lump, neck     Problem List Patient Active Problem List   Diagnosis Date Noted  . Primary malignant neoplasm of left breast with stage 2 nodal metastasis per American Joint Committee on Cancer 7th edition (N2) ([place]) 02/14/2016  . Breast cancer ([place]) 02/02/2016  . 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 Insomnia 09/01/2015  . Breast cancer of upper-outer quadrant of left female breast ([place]) 08/08/2015   Donato Heinz. Owens Shark PT  Norwood Levo 06/28/2016, 5:37 PM  [place], [place], [address] Phone: [phone]   Fax:  [phone]  Name: Alison [name] MRN: [id number] Date of Birth: Feb 01, 1961

## 2016-06-28 NOTE — Telephone Encounter (Signed)
Spoke with patient and she is aware of her fasting appointment tomorrow 06/29/16 and will see [name] afterwards.

## 2016-06-29 ENCOUNTER — Other Ambulatory Visit (HOSPITAL_BASED_OUTPATIENT_CLINIC_OR_DEPARTMENT_OTHER): Payer: 59

## 2016-06-29 ENCOUNTER — Ambulatory Visit
Admission: RE | Admit: 2016-06-29 | Discharge: 2016-06-29 | Disposition: A | Discharge status: Home / Self Care | Payer: 59 | Source: Ambulatory Visit | Attending: Radiation Oncology | Admitting: Radiation Oncology

## 2016-06-29 ENCOUNTER — Encounter: Payer: Self-pay | Admitting: Radiation Oncology

## 2016-06-29 ENCOUNTER — Encounter: Payer: 59 | Admitting: *Deleted

## 2016-06-29 ENCOUNTER — Other Ambulatory Visit: Payer: 59

## 2016-06-29 VITALS — BP 107/64 | HR 74 | Temp 98.4°F | Resp 10 | Wt 161.0 lb

## 2016-06-29 VITALS — Ht 62.25 in | Wt 158.2 lb

## 2016-06-29 DIAGNOSIS — Z79899 Other long term (current) drug therapy: Secondary | ICD-10-CM | POA: Insufficient documentation

## 2016-06-29 DIAGNOSIS — C50412 Malignant neoplasm of upper-outer quadrant of left female breast: Secondary | ICD-10-CM

## 2016-06-29 DIAGNOSIS — Z170 Estrogen receptor positive status [ER+]: Secondary | ICD-10-CM | POA: Diagnosis not present

## 2016-06-29 DIAGNOSIS — Z006 Encounter for examination for normal comparison and control in clinical research program: Secondary | ICD-10-CM

## 2016-06-29 DIAGNOSIS — R635 Abnormal weight gain: Secondary | ICD-10-CM

## 2016-06-29 DIAGNOSIS — I890 Lymphedema, not elsewhere classified: Secondary | ICD-10-CM | POA: Insufficient documentation

## 2016-06-29 HISTORY — DX: Personal history of irradiation: Z92.3

## 2016-06-29 LAB — RESEARCH LABS

## 2016-06-29 LAB — GLUCOSE (CC13): GLUCOSE: 113 mg/dL (ref 70–140)

## 2016-06-29 NOTE — Progress Notes (Signed)
Radiation Oncology         [phone] ________________________________  Name: Alison [name] MRN: [id number]  Date: 06/29/2016  DOB: 04/24/1961  Follow-Up Visit Note  Outpatient  CC: [name], MD  Erroll Luna, MD  Diagnosis and Prior Radiotherapy:    ICD-9-CM ICD-10-CM   1. Malignant neoplasm of upper-outer quadrant of left breast in female, estrogen receptor positive ([place]) 174.4 C50.412    V86.0 Z17.0     Narrative:  The patient returns today for routine follow-up about one month post treatment. Her pain in her left side is better. Continues PT for lymphedema.  Survivorship visit will be in Dec.  She started anastrozole. Right mammogram on 06-26-16 was BIRADS 1.      Skin has healed nicely over left chest wall.  Uses Aveda lotion.                         ALLERGIES:  is allergic to sulfa antibiotics and zofran [ondansetron hcl].  Meds: Current Outpatient Prescriptions  Medication Sig Dispense Refill  . anastrozole (ARIMIDEX) 1 MG tablet Take 1 tablet (1 mg total) by mouth daily. 90 tablet 4  . cetirizine (ZYRTEC) 10 MG tablet Take 10 mg by mouth daily.    . Cholecalciferol (VITAMIN D3) 2000 UNITS capsule Take 2,000 Units by mouth daily.     . Fenofibric Acid 105 MG TABS Take 1 tablet by mouth daily.    . fluticasone (FLONASE) 50 MCG/ACT nasal spray Place 2 sprays into both nostrils daily. Reported on 11/18/2015    . gabapentin (NEURONTIN) 300 MG capsule Take 1 capsule (300 mg total) by mouth 3 (three) times daily. Patient takes 100 mg 3 times a day 270 capsule 1  . hydrocortisone 2.5 % cream Apply topically 3 (three) times daily as needed. 28 g 1  . ibuprofen (ADVIL,MOTRIN) 200 MG tablet Take 400 mg by mouth every 6 (six) hours as needed for headache, mild pain or moderate pain. Reported on 01/06/2016    . levocetirizine (XYZAL) 5 MG tablet Take 5 mg by mouth every evening.    [place] Kitchen LORazepam (ATIVAN) 0.5 MG tablet Take 0.5 mg by mouth at bedtime.    . methocarbamol  (ROBAXIN) 500 MG tablet Take 1 tablet (500 mg total) by mouth every 6 (six) hours as needed for muscle spasms. 40 tablet 1  . Multiple Vitamin (MULTIVITAMIN) tablet Take 1 tablet by mouth daily.    [place] Kitchen NIFEdipine (PROCARDIA-XL/ADALAT-CC/NIFEDICAL-XL) 30 MG 24 hr tablet Take 30 mg by mouth daily.    [place] Kitchen olmesartan (BENICAR) 20 MG tablet Take 1 tablet (20 mg total) by mouth daily. 90 tablet 4  . omega-3 acid ethyl esters (LOVAZA) 1 g capsule Take 1 g by mouth 2 (two) times daily.    [place] Kitchen oxyCODONE (OXY IR/ROXICODONE) 5 MG immediate release tablet Take 1-2 tablets (5-10 mg total) by mouth every 4 (four) hours as needed for moderate pain, severe pain or breakthrough pain. 120 tablet 0  . oxyCODONE (OXYCONTIN) 20 mg 12 hr tablet Take 2 tablets (40 mg total) by mouth every 12 (twelve) hours. 120 tablet 0  . polyethylene glycol (MIRALAX / GLYCOLAX) packet Take 17 g by mouth daily as needed for mild constipation. Reported on 01/06/2016    . Probiotic Product (PROBIOTIC DAILY PO) Take 1 capsule by mouth daily. [place] prochlorperazine (COMPAZINE) 10 MG tablet Take 1 tablet (10 mg total) by  mouth every 6 (six) hours as needed (Nausea or vomiting). 30 tablet 1  . traMADol (ULTRAM) 50 MG tablet Take 1-2 tablets (50-100 mg total) by mouth every 6 (six) hours as needed for moderate pain. 60 tablet 3  . traZODone (DESYREL) 150 MG tablet Take 150 mg by mouth at bedtime.    [place] Kitchen zolpidem (AMBIEN) 5 MG tablet Take 1 tablet (5 mg total) by mouth at bedtime as needed for sleep. 30 tablet 0   No current facility-administered medications for this encounter.     Physical Findings: The patient is in no acute distress. Patient is alert and oriented.  weight is 161 lb (73 kg). Her oral temperature is 98.4 F (36.9 C). Her blood pressure is 107/64 and her pulse is 74. Her respiration is 10 and oxygen saturation is 98%. .     Skin has healed nicely over left chest wall. It is smooth and  pigmentation has mostly normalized.  Lab Findings: Lab Results  Component Value Date   WBC 5.2 05/22/2016   HGB 12.2 05/22/2016   HCT 36.5 05/22/2016   MCV 91.3 05/22/2016   PLT 327 05/22/2016    Radiographic Findings: Mm Diag Breast Tomo Uni Right  Result Date: 06/26/2016 CLINICAL DATA:  55 year old female for evaluation the right breast prior to clinical trial. Patient with history of left breast cancer and left mastectomy. EXAM: 2D DIGITAL DIAGNOSTIC UNILATERAL RIGHT MAMMOGRAM WITH CAD AND ADJUNCT TOMO COMPARISON:  Previous mammograms dating back to 12/22/2007. ACR Breast Density Category c: The breast tissue is heterogeneously dense, which may obscure small masses. FINDINGS: 2D and 3D full field views of the right breast demonstrate no suspicious mass, distortion or worrisome calcifications. Mammographic images were processed with CAD. IMPRESSION: No mammographic evidence of right breast malignancy. RECOMMENDATION: Right screening mammogram in 1 year. I have discussed the findings and recommendations with the patient. Results were also provided in writing at the conclusion of the visit. If applicable, a reminder letter will be sent to the patient regarding the next appointment. BI-RADS CATEGORY  1: Negative. Electronically Signed   By: Margarette Canada M.D.   On: 06/26/2016 15:41    Impression/Plan: healed well from radiotherapy. I encouraged her to continue with yearly mammography and followup with medical oncology. I will see her back on an as-needed basis. I have encouraged her to call if she has any issues or concerns in the future. I wished her the very best.    Eppie Gibson, MD

## 2016-06-29 NOTE — Progress Notes (Signed)
06/29/2016  Patient in to clinic today for study assessments prior to randomization. Upon arrival to clinic, patient had been fasting since 8:30pm yesterday, for 13 hours prior to today's blood sample collection. Random glucose sample was collected along with baseline research blood samples, including optional samples for the [id number] substudy. Height, weight and waist and hip circumference measurements were obtained per protocol. Thanked patient for her participation. Cindy S. Brigitte Pulse BSN, RN, [place] 06/29/2016 11:38 AM

## 2016-06-29 NOTE — Progress Notes (Signed)
She is currently in no pain. Does have pain intermittently, left side/axilla where lymph nodes were removed.  Described as sharp and dull ache.  Rating pain a 8/10 during episodes. Oxycodone and Oxycontin.  Pt complains of fatigue .  Pt left breast- positive for Pruritus, especially under breast fold, noted a small white scab.  Pt reports edema to breast site and upper left arm. Continues to apply regular lotion as needed.  BP 107/64   Pulse 74   Temp 98.4 F (36.9 C) (Oral)   Resp 10   Wt 161 lb (73 kg)   SpO2 98%   BMI 29.21 kg/m  Wt Readings from Last 3 Encounters:  06/29/16 161 lb (73 kg)  06/29/16 158 lb 3 oz (71.8 kg)  05/22/16 159 lb 3.2 oz (72.2 kg)   Pt reports: Yes No Comments  Nolvadex (tamoxifen) []  [x]    Arimidex (anastrozole) [x]  []  Start 06/27/16  Last Med Onc Appt: Date: Next:07/23/16 Doctor: Magrinat  Last Mammogram: Date:06/25/16 Next: For weight loss study  Survivorship Appt: Date:08/24/16 [] [facility] pamphlet []  [employer]

## 2016-07-02 ENCOUNTER — Other Ambulatory Visit: Payer: Self-pay

## 2016-07-02 DIAGNOSIS — C50412 Malignant neoplasm of upper-outer quadrant of left female breast: Secondary | ICD-10-CM

## 2016-07-02 MED ORDER — ZOLPIDEM TARTRATE 5 MG PO TABS: 5.0000 mg | ORAL_TABLET | Freq: Every evening | ORAL | 2 refills | Status: DC | PRN

## 2016-07-02 MED ORDER — OXYCODONE HCL ER 20 MG PO T12A: 40.0000 mg | EXTENDED_RELEASE_TABLET | Freq: Two times a day (BID) | ORAL | 0 refills | Status: DC

## 2016-07-02 MED ORDER — OXYCODONE HCL 5 MG PO TABS: 5.0000 mg | ORAL_TABLET | ORAL | 0 refills | Status: DC | PRN

## 2016-07-02 NOTE — Addendum Note (Signed)
Encounter addended by: Ernst Spell, RN on: 07/02/2016  8:14 AM<BR>    Actions taken: Charge Capture section accepted

## 2016-07-03 ENCOUNTER — Ambulatory Visit: Payer: 59 | Admitting: Physical Therapy

## 2016-07-03 DIAGNOSIS — R293 Abnormal posture: Secondary | ICD-10-CM | POA: Diagnosis not present

## 2016-07-03 DIAGNOSIS — I890 Lymphedema, not elsewhere classified: Secondary | ICD-10-CM

## 2016-07-03 DIAGNOSIS — R221 Localized swelling, mass and lump, neck: Secondary | ICD-10-CM

## 2016-07-03 DIAGNOSIS — M25512 Pain in left shoulder: Secondary | ICD-10-CM

## 2016-07-03 DIAGNOSIS — M79601 Pain in right arm: Secondary | ICD-10-CM

## 2016-07-03 NOTE — Therapy (Signed)
[place], [place], [address] Phone: [phone]   Fax:  [phone]  Physical Therapy Treatment  Patient Details  Name: Alison [name] MRN: [id number] Date of Birth: 08/23/1961 Referring Provider: [name], MD  Encounter Date: 07/03/2016      PT End of Session - 07/03/16 1414    Visit Number 6   Number of Visits 9   Date for PT Re-Evaluation 07/06/16   PT Start Time 1301   PT Stop Time 1410   PT Time Calculation (min) 69 min   Activity Tolerance Patient tolerated treatment well   Behavior During Therapy [facility] for tasks assessed/performed      Past Medical History:  Diagnosis Date  . Asthma     seasonal asthma  . Breast cancer ([place])   . Complication of anesthesia 2007   aspiration pneumonia post hysterectomy.  Blood pressure would drop low- but no problems 01/2016- surgery  . History of blood transfusion   . History of radiation therapy 04/02/16- 05/21/16   Left Chest Wall, SCV, Axilla, IM nodes  . Hypertension   . Neuropathy ([place])    with chemo  . Osteopenia determined by x-ray   . Pneumonia    1980's and 1990's  . Raynaud's disease   . Rectovaginal fistula    repaired 2014  . Skin cancer 2014   basal- back    Past Surgical History:  Procedure Laterality Date  . ABDOMINAL HYSTERECTOMY  2007   with BSO  . APPENDECTOMY  2007  . AXILLARY LYMPH NODE DISSECTION Left 02/14/2016  . AXILLARY LYMPH NODE DISSECTION Left 02/14/2016   Procedure: LEFT AXILLARY LYMPH NODE DISSECTION;  Surgeon: [name], MD;  Location: [place];  Service: General;  Laterality: Left;  . BREAST RECONSTRUCTION WITH PLACEMENT OF TISSUE EXPANDER AND FLEX HD (ACELLULAR HYDRATED DERMIS) Left 02/02/2016   Procedure: IMMEDIATE LEFT BREAST RECONSTRUCTION WITH PLACEMENT OF TISSUE EXPANDER AND FLEX HD (ACELLULAR HYDRATED DERMIS);  Surgeon: [name], DO;  Location: [facility];  Service: Plastics;   Laterality: Left;  . BREAST SURGERY Left 02/02/16   mastectomy with reconstruction  . c-section    . COLON RESECTION  4/'2014  . HERNIA REPAIR    . Port- a- cath placement  07/2015  . PORT-A-CATH REMOVAL Right 02/02/2016   Procedure: REMOVAL PORT-A-CATH;  Surgeon: [name], MD;  Location: [place];  Service: General;  Laterality: Right;  . RADIOACTIVE SEED GUIDED AXILLARY SENTINEL LYMPH NODE Left 02/02/2016   Procedure: LEFT BREAST MASTECTOMY AND RADIOACTIVE SEED GUIDED LEFT SENTINEL LYMPH NODE BIOPSY ;  Surgeon: [name], MD;  Location: [facility];  Service: General;  Laterality: Left;    There were no vitals filed for this visit.      Subjective Assessment - 07/03/16 1307    Subjective Started anastrazole and has been moody, kind of "gray"; wonders if that will level out or not.  Was advised to see her doctor.  Has had some sharp pains in left lower ribs.  Right side pains have stopped.  Has been doing the Theraband exercises but has had some pain from them.  Walking is going well; goes for 30 minutes, though it's not fast.   Currently in Pain? No/denies                         [facility] Adult PT Treatment/Exercise - 07/03/16 0001  Shoulder Exercises: Standing   Other Standing Exercises scapular series done in standing against wall vs. yellow Theraband x 10 each      Manual Therapy   Manual Lymphatic Drainage (MLD) Reclined on elevated mat:  supraclavicular fossae, shoulder collectors and bilateral axillae; posterolateral, lateral, anterolateral, and anterior neck, then reinforced pathway to axillae; in sitting, anterior and posterior thorax on right and left, then mid-back on left.   Kinesiotex Edema     Kinesiotix   Edema Skinkote used, then fanshape from left axilla and left back toward low back                PT Education - 07/03/16 1413    Education provided Yes   Education Details discussed [employer] at the [place],  [employer], and trail to recovery programs with patient   Person(s) Educated Patient   Methods Explanation;Handout  handout on trail to recovery today   Comprehension Verbalized understanding                [place] - 06/26/16 1359      CC Long Term Goal  #1   Title Patient will report a decrease in left shoulder pain by 75% so she can perform daily activities with greater ease   Status On-going     CC Long Term Goal  #2   Title Reduce circumference measurement at 6 cm superior to sternal notch to 50 cm or less.   Baseline 54.4 at eval, 52 cm at 06/26/2016    Status Revised     CC Long Term Goal  #3   Title Patient will be independent in self manual lymph drainage.   Status Achieved     CC Long Term Goal  #4   Title Patient will increase right shoulder flexion AROM to 140 degrees without any pain for improved ability to perform ADLs.   Baseline 145 on 10/31   Status Achieved     CC Long Term Goal  #5   Title Patient will utilize chip pack for at least 2 hours per day at home to assess whether compression garment would be beneficial.   Baseline has been wearing it and so compression garment may be benficial    Status Achieved     CC Long Term Goal  #6   Title Pt will be independent in strenthening program    Time 2   Period Weeks   Status New     Additional Goals   Additional Goals Yes            Plan - 07/03/16 1414    Clinical Impression Statement Patient looks much better (healthier, better color, less swollen) than last time this therapist saw her some weeks ago.  She reports the compression T-shirt felt good, and she wore it twice for about an hour each time.  She will now try to wear it longer and in conjunction with wearing chip pack.  Did well with progressing exercise (scapular series) to standing today; cueing for good posture and setting abdominal muscles during this.   Rehab Potential Good   Clinical Impairments Affecting Rehab Potential  chemotherapy with neuropathy    PT Frequency 2x / week   PT Duration 4 weeks   PT Treatment/Interventions ADLs/Self Care Home Management;Manual lymph drainage;Compression bandaging;Scar mobilization;Passive range of motion;Patient/family education;Taping;Manual techniques;Therapeutic exercise;Therapeutic activities;Orthotic Fit/Training;DME Instruction   PT Next Visit Plan reassess goals; continue manual lymph drainage; consider plan for discharge soon   PT Home Exercise Plan  wear compression T-shirt in conjunction with chip pack on neck for longer periods; scapular series in standing with yellow theraband   Consulted and Agree with Plan of Care Patient      Patient will benefit from skilled therapeutic intervention in order to improve the following deficits and impairments:  Decreased range of motion, Impaired UE functional use, Decreased activity tolerance, Decreased knowledge of precautions, Decreased skin integrity, Impaired perceived functional ability, Pain, Decreased knowledge of use of DME, Increased edema, Postural dysfunction, Decreased strength, Impaired sensation, Decreased scar mobility  Visit Diagnosis: Abnormal posture  Left shoulder pain, unspecified chronicity  Lymphedema, not elsewhere classified  Localized swelling, mass and lump, neck  Pain in right arm     Problem List Patient Active Problem List   Diagnosis Date Noted  . Primary malignant neoplasm of left breast with stage 2 nodal metastasis per American Joint Committee on Cancer 7th edition (N2) ([place]) 02/14/2016  . Breast cancer ([place]) 02/02/2016  . Chemotherapy-induced neuropathy ([place]) 12/30/2015  . Insomnia 09/01/2015  . Breast cancer of upper-outer quadrant of left female breast ([place]) 08/08/2015    Alison [name] 07/03/2016, 2:18 PM  [place], [place], [address] Phone: [phone]   Fax:  [phone]  Name: Alison [name] MRN: [id number] Date of Birth: 03/07/1961  Serafina Royals, PT 07/03/16 2:18 PM

## 2016-07-04 ENCOUNTER — Telehealth: Payer: Self-pay | Admitting: *Deleted

## 2016-07-04 NOTE — Telephone Encounter (Signed)
Refill was sent - pt aware and stated appreciation.

## 2016-07-04 NOTE — Telephone Encounter (Signed)
Pt. Called @ 1100  In regards to needing a refill on her AMBIEN. Pt. stated that she was completely out

## 2016-07-05 ENCOUNTER — Ambulatory Visit: Payer: 59 | Admitting: Physical Therapy

## 2016-07-11 ENCOUNTER — Telehealth: Payer: Self-pay | Admitting: *Deleted

## 2016-07-11 DIAGNOSIS — C50412 Malignant neoplasm of upper-outer quadrant of left female breast: Secondary | ICD-10-CM

## 2016-07-11 DIAGNOSIS — Z170 Estrogen receptor positive status [ER+]: Principal | ICD-10-CM

## 2016-07-11 NOTE — Telephone Encounter (Signed)
07/11/2016 Received return phone call from patient today, confirming her understanding of [facility] [id number] BWEL study enrollment to [employer] [facility]. Also confirmed next study visit to occur in approximately six months. Told patient that she would be notified when lab appointments require that she come in a fasting state.  Confirmed appointments on 07/23/2016, to proceed with AFT-05 PALLAS study screening, to include collection of blood samples and MD visit. Once blood and archival tissue samples have been shipped, and tissue sample receipt confirmed, will plan to proceed with randomization, if all eligibility criteria are met. Patient is in agreement with this plan, and will plan to return to clinic on 07/26/2016 for Cycle 1, Day 1 PALLAS study visit. Cindy S. Brigitte Pulse BSN, RN, CCRP 07/11/2016 3:00 PM

## 2016-07-23 ENCOUNTER — Other Ambulatory Visit (HOSPITAL_BASED_OUTPATIENT_CLINIC_OR_DEPARTMENT_OTHER): Payer: 59

## 2016-07-23 ENCOUNTER — Telehealth: Payer: Self-pay | Admitting: Oncology

## 2016-07-23 ENCOUNTER — Ambulatory Visit (HOSPITAL_BASED_OUTPATIENT_CLINIC_OR_DEPARTMENT_OTHER): Payer: 59 | Admitting: Oncology

## 2016-07-23 ENCOUNTER — Telehealth: Payer: Self-pay

## 2016-07-23 VITALS — BP 90/51 | HR 101 | Temp 97.8°F | Resp 18 | Ht 62.25 in | Wt 159.6 lb

## 2016-07-23 DIAGNOSIS — Z170 Estrogen receptor positive status [ER+]: Principal | ICD-10-CM

## 2016-07-23 DIAGNOSIS — C50412 Malignant neoplasm of upper-outer quadrant of left female breast: Secondary | ICD-10-CM

## 2016-07-23 DIAGNOSIS — Z79811 Long term (current) use of aromatase inhibitors: Secondary | ICD-10-CM | POA: Diagnosis not present

## 2016-07-23 DIAGNOSIS — C779 Secondary and unspecified malignant neoplasm of lymph node, unspecified: Secondary | ICD-10-CM | POA: Diagnosis not present

## 2016-07-23 DIAGNOSIS — C50912 Malignant neoplasm of unspecified site of left female breast: Secondary | ICD-10-CM

## 2016-07-23 LAB — COMPREHENSIVE METABOLIC PANEL
ALBUMIN: 3.5 g/dL (ref 3.5–5.0)
ALK PHOS: 61 U/L (ref 40–150)
ALT: 19 U/L (ref 0–55)
ANION GAP: 10 meq/L (ref 3–11)
AST: 26 U/L (ref 5–34)
BILIRUBIN TOTAL: 0.23 mg/dL (ref 0.20–1.20)
BUN: 14.7 mg/dL (ref 7.0–26.0)
CALCIUM: 9.9 mg/dL (ref 8.4–10.4)
CO2: 21 mEq/L — ABNORMAL LOW (ref 22–29)
CREATININE: 1.2 mg/dL — AB (ref 0.6–1.1)
Chloride: 108 mEq/L (ref 98–109)
EGFR: 50 mL/min/{1.73_m2} — ABNORMAL LOW (ref >90)
Glucose: 134 mg/dl (ref 70–140)
Potassium: 4.6 mEq/L (ref 3.5–5.1)
Sodium: 139 mEq/L (ref 136–145)
Total Protein: 6.8 g/dL (ref 6.4–8.3)

## 2016-07-23 LAB — CBC WITH DIFFERENTIAL/PLATELET
BASO%: 0.1 % (ref 0.0–2.0)
BASOS ABS: 0 10*3/uL (ref 0.0–0.1)
EOS%: 7 % (ref 0.0–7.0)
Eosinophils Absolute: 0.3 10*3/uL (ref 0.0–0.5)
HEMATOCRIT: 36.6 % (ref 34.8–46.6)
HEMOGLOBIN: 12.1 g/dL (ref 11.6–15.9)
LYMPH#: 0.9 10*3/uL (ref 0.9–3.3)
LYMPH%: 23.9 % (ref 14.0–49.7)
MCH: 30.6 pg (ref 25.1–34.0)
MCHC: 33.1 g/dL (ref 31.5–36.0)
MCV: 92.4 fL (ref 79.5–101.0)
MONO#: 0.5 10*3/uL (ref 0.1–0.9)
MONO%: 12.9 % (ref 0.0–14.0)
NEUT#: 2.2 10*3/uL (ref 1.5–6.5)
NEUT%: 56.1 % (ref 38.4–76.8)
PLATELETS: 374 10*3/uL (ref 145–400)
RBC: 3.96 10*6/uL (ref 3.70–5.45)
RDW: 14 % (ref 11.2–14.5)
WBC: 3.9 10*3/uL (ref 3.9–10.3)

## 2016-07-23 LAB — HEMOGLOBIN A1C
ESTIMATED AVERAGE GLUCOSE: 105 mg/dL
HEMOGLOBIN A1C: 5.3 % (ref 4.8–5.6)

## 2016-07-23 LAB — RESEARCH LABS

## 2016-07-23 MED ORDER — LOSARTAN POTASSIUM 50 MG PO TABS: 50.0000 mg | ORAL_TABLET | Freq: Every day | ORAL | 4 refills | Status: DC

## 2016-07-23 MED ORDER — OXYCODONE HCL ER 20 MG PO T12A: 40.0000 mg | EXTENDED_RELEASE_TABLET | Freq: Two times a day (BID) | ORAL | 0 refills | Status: DC

## 2016-07-23 MED ORDER — GABAPENTIN 300 MG PO CAPS: 1200.0000 mg | ORAL_CAPSULE | Freq: Every day | ORAL | 1 refills | Status: DC

## 2016-07-23 MED ORDER — TRAZODONE HCL 50 MG PO TABS: 150.0000 mg | ORAL_TABLET | Freq: Every day | ORAL | 1 refills | Status: DC

## 2016-07-23 MED ORDER — OXYCODONE HCL 5 MG PO TABS: 5.0000 mg | ORAL_TABLET | ORAL | 0 refills | Status: DC | PRN

## 2016-07-23 NOTE — Telephone Encounter (Signed)
Appointments scheduled per 07/23/16 los. A copy of the  AVS report and appointment schedule was given to patient,per 07/23/16 los.  °

## 2016-07-23 NOTE — Progress Notes (Signed)
[place]  [phone] [phone]) [phone]   ID: Alison [name] DOB: 29-Jul-1961  MR#: [id number]  ZDG#:[id number]  Patient Care Team: [name], MD as PCP - General (Internal Medicine) Erroll Luna, MD as Consulting Physician (General Surgery) Chauncey Cruel, MD as Consulting Physician (Oncology) Aloha Gell, MD as Consulting Physician (Obstetrics and Gynecology) Harriett Sine, MD as Consulting Physician (Dermatology) Eppie Gibson, MD as Attending Physician (Radiation Oncology) PCP: [name], MD OTHER MD:  CHIEF COMPLAINT: Estrogen receptor positive breast cancer  CURRENT TREATMENT: Anastrozole  BREAST CANCER HISTORY: From the original intake note:  [name] had routine screening mammography with tomography [facility] February 20 11/14/2014 showing a possible mass in the right breast. Diagnostic right mammography with right breast ultrasonography 11/01/2014 found the breast density to be category C. In the upper outer quadrant of the right breast there was a partially obscured mass which on physical exam was palpable as an area of thickening at the 10:00 position 8 cm from the nipple. Ultrasound showed multiple cysts in the upper outer right breast corresponding to the mass in question.  In November 2016 the patient felt a change in the upper left breast and brought it to her primary care physician's attention. Diagnostic left mammography with tomosynthesis and left breast ultrasonography at the [facility] December 01/14/2015 found trabecular thickening throughout the left breast with skin thickening, but no circumscribed mass or suspicious calcifications. A rounded mass in the left axilla measured 6 mm. There was an additional mildly prominent lymph node in the left axilla which was what the patient had been palpating. Ultrasonography showed ill-defined swelling throughout the inner left breast with overlying skin thickening..  At the 2:00 position 4 cm from the nipple there was an irregular hypoechoic mass measuring 2.3 cm. A second mass at the 1:00 area 3 cm from the nipple measured 1 cm. The distance between these 2 masses was 1.8 cm. There was also a round hypoechoic left axillary mass measuring 0.6 cm. Overall the was an area of 3.7 cm of mixed echogenicity corresponding to the area of palpable soft tissue swelling.   On 08/04/2015 the patient underwent biopsy of the mass at the 2:00 axis 4 cm from the nipple and a second biopsy was performed of the hypoechoic mass at the 1:00 position 3 cm from the nipple. Biopsy of the suspicious nodule in the lower left axilla was attempted but could not be completed as the mass became obscured after administration of lidocaine.  The pathology from both left breast biopsies 08/04/2015 ([id number]) showed invasive ductal carcinoma, grade 3. The prognostic profile obtained from the larger tumor showed estrogen receptor to be strongly positive at 95%, progesterone receptor positive at 30%, with an MIB-1 of 12%, and no HER-2 amplification, the signals ratio being 1.22 and the number per cell 2.20.  On 08/09/2015 the patient underwent biopsy of this suspicious left axillary lymph node noted above, and this showed ([id number]) metastatic carcinoma with extracapsular extension.  The patient's subsequent history is as detailed below  INTERVAL HISTORY: [name] returns for follow up of her estrogen receptor positive breast cancer accompanied by her husband [name]. She has been on anastrozole now for several weeks. She tells me that this is making her very teary-eyed and Moody. Her feet and legs hurt some particularly early morning when she gets out of bed. She is taking steps one at a time. She has mild nausea, which she takes care of with  Compazine. Very likely these various symptoms are due to her other medications although anastrozole certainly can cause some aches and pains. There usually  less focal and specific than the once [place] describes.  REVIEW OF SYSTEMS: She feels like she has trouble concentrating and that is not surprising given the fact that she is on narcotics benzodiazepines Ambien and antidepressants. She is not having hot flashes. She is now taking all her gabapentin at bedtime and that is working much better for her. She also takes Ambien at bedtime and she also takes lorazepam at bedtime. She has some ringing in her years, some hoarseness, is deconditioned and short of breath with moderate activity, has some back pain which is not new, and feels anxious. A detailed review of systems today was otherwise stable  PAST MEDICAL HISTORY: Past Medical History:  Diagnosis Date  . Asthma     seasonal asthma  . Breast cancer ([place])   . Complication of anesthesia 2007   aspiration pneumonia post hysterectomy.  Blood pressure would drop low- but no problems 01/2016- surgery  . History of blood transfusion   . History of radiation therapy 04/02/16- 05/21/16   Left Chest Wall, SCV, Axilla, IM nodes  . Hypertension   . Neuropathy ([facility])    with chemo  . Osteopenia determined by x-ray   . Pneumonia    1980's and 1990's  . Raynaud's disease   . Rectovaginal fistula    repaired 2014  . Skin cancer 2014   basal- back    PAST SURGICAL HISTORY: Past Surgical History:  Procedure Laterality Date  . ABDOMINAL HYSTERECTOMY  2007   with BSO  . APPENDECTOMY  2007  . AXILLARY LYMPH NODE DISSECTION Left 02/14/2016  . AXILLARY LYMPH NODE DISSECTION Left 02/14/2016   Procedure: LEFT AXILLARY LYMPH NODE DISSECTION;  Surgeon: [name], MD;  Location: [place];  Service: General;  Laterality: Left;  . BREAST RECONSTRUCTION WITH PLACEMENT OF TISSUE EXPANDER AND FLEX HD (ACELLULAR HYDRATED DERMIS) Left 02/02/2016   Procedure: IMMEDIATE LEFT BREAST RECONSTRUCTION WITH PLACEMENT OF TISSUE EXPANDER AND FLEX HD (ACELLULAR HYDRATED DERMIS);  Surgeon: [name], DO;  Location: [place];  Service: Plastics;  Laterality: Left;  . BREAST SURGERY Left 02/02/16   mastectomy with reconstruction  . c-section    . COLON RESECTION  4/'2014  . HERNIA REPAIR    . Port- a- cath placement  07/2015  . PORT-A-CATH REMOVAL Right 02/02/2016   Procedure: REMOVAL PORT-A-CATH;  Surgeon: [name], MD;  Location: [place];  Service: General;  Laterality: Right;  . RADIOACTIVE SEED GUIDED AXILLARY SENTINEL LYMPH NODE Left 02/02/2016   Procedure: LEFT BREAST MASTECTOMY AND RADIOACTIVE SEED GUIDED LEFT SENTINEL LYMPH NODE BIOPSY ;  Surgeon: [name], MD;  Location: [place];  Service: General;  Laterality: Left;    FAMILY HISTORY Family History  Problem Relation Age of Onset  . Lung cancer Father    the patient's father died from lung cancer at the age of 52 in the setting of tobacco abuse. The patient's mother died at the age of 64 with pancreatic cancer. The patient had no siblings. There is no history of breast or ovarian cancer in the family.  GYNECOLOGIC HISTORY:  No LMP recorded. Patient has had a hysterectomy. Menarche age 49, first live birth age 10. The patient is GX P1. She is status post total hysterectomy with bilateral salpingo-oophorectomy. She has been on hormone replacement since that time and  is tapering to off at the time of this dictation.   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 She used to work at [employer]. Her husband [name] works for [employer] division at a [employer] their son [name] is currently at home.    ADVANCED DIRECTIVES: Not in place   HEALTH MAINTENANCE: Social History  Substance Use Topics  . Smoking status: Former Smoker    Packs/day: 1.00    Years: 10.00    Types: Cigarettes    Quit date: 11/25/2008  . Smokeless tobacco: Never Used     Comment: on and off smoker never consistent  . Alcohol use 2.4 oz/week    4 Glasses of wine per week     Colonoscopy: April  2014/ [facility]naldo [facility]  PAP: s/p hysterectomy  Bone density: August 2016/ osteopenia  Lipid panel:  Allergies  Allergen Reactions  . Sulfa Antibiotics Other (See Comments)    Blisters in mouth  . Zofran [Ondansetron Hcl] Other (See Comments)    Severe Headaches     Current Outpatient Prescriptions  Medication Sig Dispense Refill  . anastrozole (ARIMIDEX) 1 MG tablet Take 1 tablet (1 mg total) by mouth daily. 90 tablet 4  . cetirizine (ZYRTEC) 10 MG tablet Take 10 mg by mouth daily.    . Cholecalciferol (VITAMIN D3) 2000 UNITS capsule Take 2,000 Units by mouth daily.     . Fenofibric Acid 105 MG TABS Take 1 tablet by mouth daily.    . fluticasone (FLONASE) 50 MCG/ACT nasal spray Place 2 sprays into both nostrils daily. Reported on 11/18/2015    . gabapentin (NEURONTIN) 300 MG capsule Take 4 capsules (1,200 mg total) by mouth at bedtime. Patient takes 100 mg 3 times a day 120 capsule 1  . ibuprofen (ADVIL,MOTRIN) 200 MG tablet Take 400 mg by mouth every 6 (six) hours as needed for headache, mild pain or moderate pain. Reported on 01/06/2016    . levocetirizine (XYZAL) 5 MG tablet Take 5 mg by mouth every evening.    Marland Kitchen LORazepam (ATIVAN) 0.5 MG tablet Take 0.5 mg by mouth at bedtime.    Marland Kitchen losartan (COZAAR) 50 MG tablet Take 1 tablet (50 mg total) by mouth daily. 90 tablet 4  . Multiple Vitamin (MULTIVITAMIN) tablet Take 1 tablet by mouth daily.    Marland Kitchen olmesartan (BENICAR) 20 MG tablet Take 1 tablet (20 mg total) by mouth daily. 90 tablet 4  . omega-3 acid ethyl esters (LOVAZA) 1 g capsule Take 1 g by mouth 2 (two) times daily.    Marland Kitchen oxyCODONE (OXY IR/ROXICODONE) 5 MG immediate release tablet Take 1-2 tablets (5-10 mg total) by mouth every 4 (four) hours as needed for moderate pain, severe pain or breakthrough pain. 120 tablet 0  . oxyCODONE (OXYCONTIN) 20 mg 12 hr tablet Take 2 tablets (40 mg total) by mouth every 12 (twelve) hours. 120 tablet 0  . polyethylene glycol (MIRALAX / GLYCOLAX)  packet Take 17 g by mouth daily as needed for mild constipation. Reported on 01/06/2016    . Probiotic Product (PROBIOTIC DAILY PO) Take 1 capsule by mouth daily. [place] prochlorperazine (COMPAZINE) 10 MG tablet Take 1 tablet (10 mg total) by mouth every 6 (six) hours as needed (Nausea or vomiting). 30 tablet 1  . traMADol (ULTRAM) 50 MG tablet Take 1-2 tablets (50-100 mg total) by mouth every 6 (six) hours as needed for moderate pain. [place]  tablet 3  . traZODone (DESYREL) 50 MG tablet Take 3 tablets (150 mg total) by mouth at bedtime. 90 tablet 1  . zolpidem (AMBIEN) 5 MG tablet Take 1 tablet (5 mg total) by mouth at bedtime as needed for sleep. 30 tablet 2   No current facility-administered medications for this visit.     OBJECTIVE: Middle-aged white woman  Vitals:   07/23/16 1105  BP: (!) 90/51  Pulse: (!) 101  Resp: 18  Temp: 97.8 F (36.6 C)     Body mass index is 28.96 kg/m.    ECOG FS:1 - Symptomatic but completely ambulatory  Sclerae unicteric, pupils round and equal Oropharynx clear and moist-- no thrush or other lesions No cervical, infraclavicular or supraclavicular adenopathy; no axillary adenopathy Lungs no rales or rhonchi Heart regular rate and rhythm Abd soft, obese, nontender, positive bowel sounds MSK no focal spinal tenderness, no upper extremity lymphedema; limited range of motion both upper extremities, to 90 Neuro: nonfocal, well oriented, appropriate affect Breasts: The right breast is benign. The left breast is status post mastectomy with expander in place. There is no evidence of chest wall recurrence.  LAB RESULTS:  CMP     Component Value Date/Time   NA 139 07/23/2016 1031   K 4.6 07/23/2016 1031   CL 110 02/15/2016 0330   CO2 21 (L) 07/23/2016 1031   GLUCOSE 134 07/23/2016 1031   BUN 14.7 07/23/2016 1031   CREATININE 1.2 (H) 07/23/2016 1031   CALCIUM 9.9 07/23/2016 1031   PROT 6.8 07/23/2016 1031    ALBUMIN 3.5 07/23/2016 1031   AST 26 07/23/2016 1031   ALT 19 07/23/2016 1031   ALKPHOS 61 07/23/2016 1031   BILITOT 0.23 07/23/2016 1031   GFRNONAA >60 02/15/2016 0330   GFRAA >60 02/15/2016 0330    INo results found for: SPEP, UPEP  Lab Results  Component Value Date   WBC 3.9 07/23/2016   NEUTROABS 2.2 07/23/2016   HGB 12.1 07/23/2016   HCT 36.6 07/23/2016   MCV 92.4 07/23/2016   PLT 374 07/23/2016      Chemistry      Component Value Date/Time   NA 139 07/23/2016 1031   K 4.6 07/23/2016 1031   CL 110 02/15/2016 0330   CO2 21 (L) 07/23/2016 1031   BUN 14.7 07/23/2016 1031   CREATININE 1.2 (H) 07/23/2016 1031      Component Value Date/Time   CALCIUM 9.9 07/23/2016 1031   ALKPHOS 61 07/23/2016 1031   AST 26 07/23/2016 1031   ALT 19 07/23/2016 1031   BILITOT 0.23 07/23/2016 1031       No results found for: LABCA2  No components found for: LABCA125  No results for input(s): INR in the last 168 hours.  Urinalysis    Component Value Date/Time   COLORURINE YELLOW 11/12/2015 [place] 11/12/2015 0837   LABSPEC 1.005 05/15/2016 1405   PHURINE 6.0 05/15/2016 1405   PHURINE 6.5 11/12/2015 0837   GLUCOSEU Negative 05/15/2016 1405   HGBUR Small 05/15/2016 1405   HGBUR MODERATE (A) 11/12/2015 0837   BILIRUBINUR Negative 05/15/2016 1405   KETONESUR Negative 05/15/2016 [place] 11/12/2015 0837   PROTEINUR Negative 05/15/2016 1405   PROTEINUR NEGATIVE 11/12/2015 0837   UROBILINOGEN 0.2 05/15/2016 1405   NITRITE Negative 05/15/2016 1405   NITRITE NEGATIVE 11/12/2015 0837   LEUKOCYTESUR Moderate 05/15/2016 1405    STUDIES: Mm Diag Breast Tomo Uni Right  Result Date: 06/26/2016 CLINICAL DATA:  55 year old female for evaluation the right breast prior to clinical trial. Patient with history of left breast cancer and left mastectomy. EXAM: 2D DIGITAL DIAGNOSTIC UNILATERAL RIGHT MAMMOGRAM WITH CAD AND ADJUNCT TOMO COMPARISON:   Previous mammograms dating back to 12/22/2007. ACR Breast Density Category c: The breast tissue is heterogeneously dense, which may obscure small masses. FINDINGS: 2D and 3D full field views of the right breast demonstrate no suspicious mass, distortion or worrisome calcifications. Mammographic images were processed with CAD. IMPRESSION: No mammographic evidence of right breast malignancy. RECOMMENDATION: Right screening mammogram in 1 year. I have discussed the findings and recommendations with the patient. Results were also provided in writing at the conclusion of the visit. If applicable, a reminder letter will be sent to the patient regarding the next appointment. BI-RADS CATEGORY  1: Negative. Electronically Signed   By: Margarette Canada M.D.   On: 06/26/2016 15:41   PROTOCOLS: BWEL, ABC and PALLAS   ASSESSMENT: 55 y.o. [place] woman status post biopsy of 2 separate masses in the left breast upper outer quadrant 08/04/2015, clinically mT2 N1, stage IIb/IIIa invasive ductal carcinoma, grade 3, the larger mass being estrogen and progesterone receptor positive, HER-2 negative, with an MIB-1 of 12%  (a) biopsy of a left axillary lymph node 08/09/2015 was positive, with extracapsular extension  (1) neoadjuvant chemotherapy started 08/25/2015 consisting of doxorubicin and cyclophosphamide in dose dense fashion 4, completed 10/06/2015,  followed by weekly paclitaxel 10 completed 12/23/2015  (a) final 2 planned cycles of weekly paclitaxel omitted because of neuropathy concerns  (2) status post left mastectomy with targeted axillary dissection 02/02/2016 for a ypT2 ypN2a, stage IIIa invasive ductal carcinoma, grade 2, with negative margins, repeat prognostic panel showing estrogen receptor positive, but progesterone receptor and HER-2 negative  (a) completion axillary lymph node dissection 02/14/2016 showed an additional 2 out of 8 lymph nodes to be involved (final count 6 out of 10 lymph nodes positive  (b)  expander placement at the time of left mastectomy  (3) adjuvant capecitabine starting concurrently with radiation--discontinued 05/21/2016  (4) adjuvant  radiation started 04/02/2016, completed 05/21/2016  (5) anastrozole started October 2017  (a) bone density August 2016 showed osteopenia  (b) the patient is status post total abdominal hysterectomy with bilateral salpingo-oophorectomy.  (6) postoperative pain: Starting OxyContin 20 mg twice a day with oxycodone as needed for breakthrough pain 03/02/2016  (7) Research:  (a) B-WEL study ([facility] [id number])--enrolled in Arm 1 (received education)  (b) PALLAS AFT-05 study screening in progress ihormone: Wednesday, so neither of those aren't but there is no Friday would be okay    PLAN: Finley has recovered well from her surgery and radiation. She is tolerating the anastrozole moderately well. Some of the side effects that she ascribes to that medication may actually not be due to that medication so I encouraged her to push on and see if she can "makes friends" with it over the next several weeks.  She still having significant pain in the left armpit area with very limited range of motion of the left upper extremity. She is scheduled for physical therapy but has not yet started that. She continues on OxyContin 40 mg twice daily with the occasional OxyIR in between. I refilled those medications today and I think it might be useful if she was referred to a pain clinic for consideration of alternative pain treatment methods. We will place that referral for her and she is very much in agreement.  She is supposed to see me between  December 26 in December 29. I will be here the 29th, [place] the beach that [place]. She could come to see one of my partners December 26 and I will try to arrange for that.  I counseled her that benzodiazepines and Ambien when taken every night result in tolerance and do not work out well. She will try to take those less  frequently.  I am taking her off her nifedipine, which interacts to some extent with the palbociclib. She will be on losartan 50 mg daily instead. We will follow her blood pressure at the next visit.  Otherwise she knows to call for any problems that may develop before she returns to see me. She will make sure in particular to check her medications the week before Christmas to make sure she will not run out of Christmas week.  :Chauncey Cruel, MD   07/23/2016 11:54 AM

## 2016-07-23 NOTE — Telephone Encounter (Signed)
Referral faxed to [facility]

## 2016-07-24 ENCOUNTER — Other Ambulatory Visit: Payer: Self-pay | Admitting: Oncology

## 2016-07-25 ENCOUNTER — Ambulatory Visit: Payer: 59 | Admitting: Physical Therapy

## 2016-07-25 ENCOUNTER — Ambulatory Visit: Payer: 59 | Admitting: Oncology

## 2016-07-25 ENCOUNTER — Other Ambulatory Visit: Payer: Self-pay | Admitting: *Deleted

## 2016-07-25 DIAGNOSIS — M25612 Stiffness of left shoulder, not elsewhere classified: Secondary | ICD-10-CM

## 2016-07-25 DIAGNOSIS — I890 Lymphedema, not elsewhere classified: Secondary | ICD-10-CM

## 2016-07-25 DIAGNOSIS — R221 Localized swelling, mass and lump, neck: Secondary | ICD-10-CM

## 2016-07-25 DIAGNOSIS — R293 Abnormal posture: Secondary | ICD-10-CM | POA: Diagnosis not present

## 2016-07-25 DIAGNOSIS — Z170 Estrogen receptor positive status [ER+]: Principal | ICD-10-CM

## 2016-07-25 DIAGNOSIS — C50412 Malignant neoplasm of upper-outer quadrant of left female breast: Secondary | ICD-10-CM

## 2016-07-25 DIAGNOSIS — M25512 Pain in left shoulder: Secondary | ICD-10-CM

## 2016-07-25 NOTE — Therapy (Signed)
[name], [place], [address] Phone: [phone]   Fax:  [phone]  Physical Therapy Treatment  Patient Details  Name: Alison [name] MRN: [id number] Date of Birth: 05-17-1961 Referring Provider: [name]   Encounter Date: 07/25/2016      PT End of Session - 07/25/16 1226    Visit Number 7   Number of Visits 17   Date for PT Re-Evaluation 08/24/16   PT Start Time 1100   PT Stop Time 1147   PT Time Calculation (min) 47 min   Activity Tolerance Patient tolerated treatment well   Behavior During Therapy [facility] for tasks assessed/performed      Past Medical History:  Diagnosis Date  . Asthma     seasonal asthma  . Breast cancer ([place])   . Complication of anesthesia 2007   aspiration pneumonia post hysterectomy.  Blood pressure would drop low- but no problems 01/2016- surgery  . History of blood transfusion   . History of radiation therapy 04/02/16- 05/21/16   Left Chest Wall, SCV, Axilla, IM nodes  . Hypertension   . Neuropathy ([place])    with chemo  . Osteopenia determined by x-ray   . Pneumonia    1980's and 1990's  . Raynaud's disease   . Rectovaginal fistula    repaired 2014  . Skin cancer 2014   basal- back    Past Surgical History:  Procedure Laterality Date  . ABDOMINAL HYSTERECTOMY  2007   with BSO  . APPENDECTOMY  2007  . AXILLARY LYMPH NODE DISSECTION Left 02/14/2016  . AXILLARY LYMPH NODE DISSECTION Left 02/14/2016   Procedure: LEFT AXILLARY LYMPH NODE DISSECTION;  Surgeon: [name], MD;  Location: [place];  Service: General;  Laterality: Left;  . BREAST RECONSTRUCTION WITH PLACEMENT OF TISSUE EXPANDER AND FLEX HD (ACELLULAR HYDRATED DERMIS) Left 02/02/2016   Procedure: IMMEDIATE LEFT BREAST RECONSTRUCTION WITH PLACEMENT OF TISSUE EXPANDER AND FLEX HD (ACELLULAR HYDRATED DERMIS);  Surgeon: [name], DO;  Location: [place];  Service: Plastics;   Laterality: Left;  . BREAST SURGERY Left 02/02/16   mastectomy with reconstruction  . c-section    . COLON RESECTION  4/'2014  . HERNIA REPAIR    . Port- a- cath placement  07/2015  . PORT-A-CATH REMOVAL Right 02/02/2016   Procedure: REMOVAL PORT-A-CATH;  Surgeon: [name], MD;  Location: [place];  Service: General;  Laterality: Right;  . RADIOACTIVE SEED GUIDED AXILLARY SENTINEL LYMPH NODE Left 02/02/2016   Procedure: LEFT BREAST MASTECTOMY AND RADIOACTIVE SEED GUIDED LEFT SENTINEL LYMPH NODE BIOPSY ;  Surgeon: [name], MD;  Location: [place];  Service: General;  Laterality: Left;    There were no vitals filed for this visit.      Subjective Assessment - 07/25/16 1220    Subjective Pt has not been back for PT in several weeks because she had an illness and then to [place] for Thanksgiving.  She noticed temporary fullness in her left arm after the flight.  She is having some sharp pain in right lower rib area and is afraid she may have pulled a muscle there. She feels fullness and pain in her left axilla    Pertinent History Breast cancer diagnosed November2016 She had chemotherapy. but did have some neuropathy so it had to be stopped. On June 8, she had a mastectomy with immedicate expander placement and with first set of lymphnodes removed, On June 20 she  went for complete axillary lymph node dissection. She says she has had always been "full necked" but has neck and upper body fullness since the cancer was diagnosed. She reports she has osteopenia in low back and has had a fracture that was discovered in 2014 when she had a Dexa scan.   Patient Stated Goals to decrease the edema   Currently in Pain? Yes   Pain Score 0-No pain  did not rate    Pain Location Axilla   Pain Orientation Left   Pain Descriptors / Indicators Aching   Pain Type Chronic pain   Multiple Pain Sites Yes   Pain Score --  did not rate    Pain Location Rib cage   Pain  Orientation Right   Pain Descriptors / Indicators Sharp   Pain Type Acute pain   Pain Onset In the past 7 days   Aggravating Factors  movment    Pain Relieving Factors lying still             [facility] PT Assessment - 07/25/16 0001      Assessment   Medical Diagnosis breast cancer   Referring Provider [name]    Onset Date/Surgical Date 07/03/15   Hand Dominance Right                     [facility] Adult PT Treatment/Exercise - 07/25/16 0001      Self-Care   Other Self-Care Comments  chip pack to left axilla with comprex to add more firmness to try to decrease fullness      Manual Therapy   Manual Lymphatic Drainage (MLD) in supine short neck, superficial and deep abdominals, right axillary nodes, anterior interaxillary anastamosis, anterior and posterior neck, then to right sidelying for posterior interaxillary anastamosis, and back                         [facility] Goals - 07/25/16 1112      CC Long Term Goal  #1   Title Patient will report a decrease in left shoulder pain by 75% so she can perform daily activities with greater ease   Baseline Pain had gotten better, but has increased pain and swelling    Time 4   Status On-going     CC Long Term Goal  #2   Title Reduce circumference measurement at 6 cm superior to sternal notch to 50 cm or less.   Baseline 54.4 at eval, 52 cm at 06/26/2016 . 51 cm on 07/25/2016   Time 4   Period Weeks   Status On-going     CC Long Term Goal  #3   Title Patient will be independent in self manual lymph drainage.   Baseline needs review    Time 4   Period Weeks   Status On-going     CC Long Term Goal  #4   Title Patient will increase right shoulder flexion AROM to 140 degrees without any pain for improved ability to perform ADLs.   Baseline 145 on 10/31, 130 degrees 07/25/2016   Time 4   Period Weeks   Status On-going     CC Long Term Goal  #5   Title Patient will utilize chip pack for at  least 2 hours per day at home to assess whether compression garment would be beneficial.   Baseline chip packs help, but fluid returns when chip pack is not used    Time 4  Period Weeks   Status On-going     CC Long Term Goal  #6   Title Pt will be independent in strenthening program    Time 4   Period Weeks   Status On-going            Plan - 07/25/16 1227    Clinical Impression Statement Pt comes back in after serveral weeks with visible edema in left anterior axilla in addition to ongoing swelling in neck and back. She also is having increased pain.  She admits she has not been doing her exercise at home and wants to resume PT to get symtoms back under control. She wants to try the Felxiitouch that she did not pursue earlier so rep was contacted and he still has her information. Renewal sent     Rehab Potential Good   Clinical Impairments Affecting Rehab Potential chemotherapy with neuropathy    PT Frequency 2x / week   PT Duration 4 weeks   PT Treatment/Interventions ADLs/Self Care Home Management;Manual lymph drainage;Compression bandaging;Scar mobilization;Passive range of motion;Patient/family education;Taping;Manual techniques;Therapeutic exercise;Therapeutic activities;Orthotic Fit/Training;DME Instruction   PT Next Visit Plan assess compression pack, continue with manual lymph drainage. follow up with her about Flexitouch and let her know rep will be calling.  consider kinesiotape, resume exercise    Consulted and Agree with Plan of Care Patient      Patient will benefit from skilled therapeutic intervention in order to improve the following deficits and impairments:  Decreased range of motion, Impaired UE functional use, Decreased activity tolerance, Decreased knowledge of precautions, Decreased skin integrity, Impaired perceived functional ability, Pain, Decreased knowledge of use of DME, Increased edema, Postural dysfunction, Decreased strength, Impaired sensation,  Decreased scar mobility  Visit Diagnosis: Abnormal posture - Plan: PT plan of care cert/re-cert  Left shoulder pain, unspecified chronicity - Plan: PT plan of care cert/re-cert  Lymphedema, not elsewhere classified - Plan: PT plan of care cert/re-cert  Localized swelling, mass and lump, neck - Plan: PT plan of care cert/re-cert  Stiffness of left shoulder joint - Plan: PT plan of care cert/re-cert     Problem List Patient Active Problem List   Diagnosis Date Noted  . Primary malignant neoplasm of left breast with stage 2 nodal metastasis per American Joint Committee on Cancer 7th edition (N2) ([place]) 02/14/2016  . Chemotherapy-induced neuropathy ([place]) 12/30/2015  . Insomnia 09/01/2015  . Breast cancer of upper-outer quadrant of left female breast ([place]) 08/08/2015   Donato Heinz. Owens Shark PT  Norwood Levo 07/25/2016, 12:38 PM  [place], [place], [address] Phone: [phone]   Fax:  [phone]  Name: Alison [name] MRN: [id number] Date of Birth: 1961/03/25

## 2016-07-26 ENCOUNTER — Other Ambulatory Visit: Payer: 59

## 2016-07-26 ENCOUNTER — Encounter: Payer: Self-pay | Admitting: Oncology

## 2016-07-26 ENCOUNTER — Encounter: Payer: 59 | Admitting: *Deleted

## 2016-07-26 ENCOUNTER — Other Ambulatory Visit: Payer: Self-pay | Admitting: Oncology

## 2016-07-26 DIAGNOSIS — Z170 Estrogen receptor positive status [ER+]: Principal | ICD-10-CM

## 2016-07-26 DIAGNOSIS — C50412 Malignant neoplasm of upper-outer quadrant of left female breast: Secondary | ICD-10-CM

## 2016-07-26 LAB — RESEARCH LABS

## 2016-07-26 NOTE — Progress Notes (Signed)
07/26/2016 Patient in to clinic today for [facility] visit. Upon arrival to clinic, PRO questionnaires were completed independently by the patient. Patient then proceeded to lab for collection of C1D1 research blood samples. Screening assessments were performed within 7 days of C1D1 and therefore were not required to be repeated. Study medication bottle with Palbociclib 125 mg capsules, kit #[id number], was dispensed by pharmacist Carolynne Edouard, and was reviewed with the patient, noting that medication is taken for the first 21 days of the 4 week cycle, beginning today, and should be taken at approximately the same time each day with food. Patient understands that anastrozole is taken daily without interruption. She reports that she currently takes her anastrozole in the morning. Patient was given diaries for recording anti-hormone therapy (anastrozole) and palbociclib doses, noting the time and number of pills taken, and instructions for both medications were reviewed with her. Patient is aware that she should avoid grapefruit and grapefruit juice during the 2-year study period to avoid medication interactions. Instructed patient to return her study medication bottles at her next cycle visit in four weeks, including any unused capsules, if any are remaining. Patient is aware that she will return to the clinic in two weeks for lab tests, and that she will make visits to the clinic every two weeks for the first eight weeks on the study. Patient was instructed to notify the office of any questions or concerns, or if new symptoms develop. Due to patient's travel out of town at the end of the year, and due to provider out of office, patient will be seen two days early on 08/21/2016 by Dr. Burr Medico. Cindy S. Brigitte Pulse BSN, RN, CCRP 07/26/2016 2:31 PM   Baseline Adverse Events  Adverse Event          Grade Onset Date     Seasonal asthma  2    Anxiety    2 12/29/2006 Raynaud's  syndrome  2 12/25/2012 Hypertension   2 12/25/2012 Elevated cholesterol  1 12/25/2012 Osteopenia   1 UN 2014 Insomnia   2 07/2015 Constipation   2 07/2015 Nausea, intermittent  1 08/2015 Joint pain   2 09/08/2015 Sensory neuropathy  2 12/30/2015  Creatinine increased  1 07/23/2016  Hypotension   1 07/23/2016  Sinus tachycardia  1 07/23/2016   Surgical site pain  2 02/02/2016 Edema, trunk and neck 2 03/01/2016 Cindy S. Brigitte Pulse BSN, RN, [place] 09/05/2016 3:13 PM

## 2016-07-27 ENCOUNTER — Ambulatory Visit: Payer: 59 | Attending: General Surgery | Admitting: Physical Therapy

## 2016-07-27 DIAGNOSIS — M25512 Pain in left shoulder: Secondary | ICD-10-CM | POA: Insufficient documentation

## 2016-07-27 DIAGNOSIS — I890 Lymphedema, not elsewhere classified: Secondary | ICD-10-CM | POA: Diagnosis present

## 2016-07-27 DIAGNOSIS — M79601 Pain in right arm: Secondary | ICD-10-CM | POA: Diagnosis present

## 2016-07-27 DIAGNOSIS — R221 Localized swelling, mass and lump, neck: Secondary | ICD-10-CM | POA: Insufficient documentation

## 2016-07-27 DIAGNOSIS — R293 Abnormal posture: Secondary | ICD-10-CM | POA: Diagnosis not present

## 2016-07-27 DIAGNOSIS — M25612 Stiffness of left shoulder, not elsewhere classified: Secondary | ICD-10-CM

## 2016-07-27 NOTE — Therapy (Signed)
Alison [name], [place], [address] Phone: [phone]   Fax:  [phone]  Physical Therapy Treatment  Patient Details  Name: Alison [name] MRN: [id number] Date of Birth: 08-23-1961 Referring Provider: [name]   Encounter Date: 07/27/2016      PT End of Session - 07/27/16 1135    Visit Number 8   Number of Visits 17   Date for PT Re-Evaluation 08/24/16   PT Start Time 0935   PT Stop Time 1015   PT Time Calculation (min) 40 min   Activity Tolerance Patient tolerated treatment well   Behavior During Therapy [facility] for tasks assessed/performed      Past Medical History:  Diagnosis Date  . Asthma     seasonal asthma  . Breast cancer ([place])   . Complication of anesthesia 2007   aspiration pneumonia post hysterectomy.  Blood pressure would drop low- but no problems 01/2016- surgery  . History of blood transfusion   . History of radiation therapy 04/02/16- 05/21/16   Left Chest Wall, SCV, Axilla, IM nodes  . Hypertension   . Neuropathy ([place])    with chemo  . Osteopenia determined by x-ray   . Pneumonia    1980's and 1990's  . Raynaud's disease   . Rectovaginal fistula    repaired 2014  . Skin cancer 2014   basal- back    Past Surgical History:  Procedure Laterality Date  . ABDOMINAL HYSTERECTOMY  2007   with BSO  . APPENDECTOMY  2007  . AXILLARY LYMPH NODE DISSECTION Left 02/14/2016  . AXILLARY LYMPH NODE DISSECTION Left 02/14/2016   Procedure: LEFT AXILLARY LYMPH NODE DISSECTION;  Surgeon: [name], MD;  Location: [place];  Service: General;  Laterality: Left;  . BREAST RECONSTRUCTION WITH PLACEMENT OF TISSUE EXPANDER AND FLEX HD (ACELLULAR HYDRATED DERMIS) Left 02/02/2016   Procedure: IMMEDIATE LEFT BREAST RECONSTRUCTION WITH PLACEMENT OF TISSUE EXPANDER AND FLEX HD (ACELLULAR HYDRATED DERMIS);  Surgeon: [name], DO;  Location: [place];  Service: Plastics;   Laterality: Left;  . BREAST SURGERY Left 02/02/16   mastectomy with reconstruction  . c-section    . COLON RESECTION  4/'2014  . HERNIA REPAIR    . Port- a- cath placement  07/2015  . PORT-A-CATH REMOVAL Right 02/02/2016   Procedure: REMOVAL PORT-A-CATH;  Surgeon: [name], MD;  Location: [place];  Service: General;  Laterality: Right;  . RADIOACTIVE SEED GUIDED AXILLARY SENTINEL LYMPH NODE Left 02/02/2016   Procedure: LEFT BREAST MASTECTOMY AND RADIOACTIVE SEED GUIDED LEFT SENTINEL LYMPH NODE BIOPSY ;  Surgeon: [name], MD;  Location: [place];  Service: General;  Laterality: Left;    There were no vitals filed for this visit.      Subjective Assessment - 07/27/16 0939    Subjective Pt states that she got into the study and will be monitored every 2 weeks to see if this limits cancer recurrance.  She is also is a study for weight loss.    Pertinent History Breast cancer diagnosed November2016 She had chemotherapy. but did have some neuropathy so it had to be stopped. On June 8, she had a mastectomy with immedicate expander placement and with first set of lymphnodes removed, On June 20 she went for complete axillary lymph node dissection. She says she has had always been "full necked" but has neck and upper body fullness since the cancer was diagnosed. She reports she has  osteopenia in low back and has had a fracture that was discovered in 2014 when she had a Dexa scan.   Patient Stated Goals to decrease the edema   Currently in Pain? Yes   Pain Score 5    Pain Location Rib cage   Pain Orientation Right   Pain Descriptors / Indicators Sharp   Pain Type Acute pain   Pain Onset In the past 7 days   Aggravating Factors  moving to get out of bed    Pain Relieving Factors dowel rod stretching seems to help                          [facility] Adult PT Treatment/Exercise - 07/27/16 0001      Self-Care   Other Self-Care Comments  fastened  chip pack around chest with thin stockinette to apply more pressure to left axilla to try to decrease fullness      Shoulder Exercises: Standing   Other Standing Exercises isometric shoulder flexion, abduction and extension x 5-10 reps      Manual Therapy   Manual Therapy Myofascial release   Myofascial Release to trigger point at left posterior axilla    Manual Lymphatic Drainage (MLD) in supine short neck, superficial and deep abdominals, right axillary nodes, anterior interaxillary anastamosis, anterior and posterior neck, then to right sidelying for posterior interaxillary anastamosis, and back    Kinesiotex Edema     Kinesiotix   Edema Skinkote used, then fanshape from left axilla and left back toward low back                PT Education - 07/27/16 1131    Education provided Yes   Education Details isometric deltoid exercise   Person(s) Educated Patient   Methods Explanation;Demonstration;Handout   Comprehension Verbalized understanding;Returned demonstration                [facility] Goals - 07/25/16 1112      CC Long Term Goal  #1   Title Patient will report a decrease in left shoulder pain by 75% so she can perform daily activities with greater ease   Baseline Pain had gotten better, but has increased pain and swelling    Time 4   Status On-going     CC Long Term Goal  #2   Title Reduce circumference measurement at 6 cm superior to sternal notch to 50 cm or less.   Baseline 54.4 at eval, 52 cm at 06/26/2016 . 51 cm on 07/25/2016   Time 4   Period Weeks   Status On-going     CC Long Term Goal  #3   Title Patient will be independent in self manual lymph drainage.   Baseline needs review    Time 4   Period Weeks   Status On-going     CC Long Term Goal  #4   Title Patient will increase right shoulder flexion AROM to 140 degrees without any pain for improved ability to perform ADLs.   Baseline 145 on 10/31, 130 degrees 07/25/2016   Time 4    Period Weeks   Status On-going     CC Long Term Goal  #5   Title Patient will utilize chip pack for at least 2 hours per day at home to assess whether compression garment would be beneficial.   Baseline chip packs help, but fluid returns when chip pack is not used    Time 4  Period Weeks   Status On-going     CC Long Term Goal  #6   Title Pt will be independent in strenthening program    Time 4   Period Weeks   Status On-going            Plan - 07/27/16 1136    Clinical Impression Statement Pt states she feels better today and fullness in left axilla seems softer, but she had trouble keeping chip pack in place.  Tried to tie it around her chest and upgraded treatment with myofascial release and reviewed home isometric exercise    Rehab Potential Good   Clinical Impairments Affecting Rehab Potential chemotherapy with neuropathy    PT Frequency 2x / week   PT Duration 4 weeks   PT Treatment/Interventions ADLs/Self Care Home Management;Manual lymph drainage;Compression bandaging;Scar mobilization;Passive range of motion;Patient/family education;Taping;Manual techniques;Therapeutic exercise;Therapeutic activities;Orthotic Fit/Training;DME Instruction   PT Next Visit Plan assess compression pack, continue with manual lymph drainage. follow up with her about Flexitouch and let her know rep will be calling.  consider kinesiotape, resume exercise    Consulted and Agree with Plan of Care Patient      Patient will benefit from skilled therapeutic intervention in order to improve the following deficits and impairments:  Decreased range of motion, Impaired UE functional use, Decreased activity tolerance, Decreased knowledge of precautions, Decreased skin integrity, Impaired perceived functional ability, Pain, Decreased knowledge of use of DME, Increased edema, Postural dysfunction, Decreased strength, Impaired sensation, Decreased scar mobility  Visit Diagnosis: Abnormal  posture  Lymphedema, not elsewhere classified  Localized swelling, mass and lump, neck  Left shoulder pain, unspecified chronicity  Stiffness of left shoulder joint     Problem List Patient Active Problem List   Diagnosis Date Noted  . Primary malignant neoplasm of left breast with stage 2 nodal metastasis per American Joint Committee on Cancer 7th edition (N2) ([place]) 02/14/2016  . 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 Insomnia 09/01/2015  . Breast cancer of upper-outer quadrant of left female breast ([place]) 08/08/2015   Donato Heinz. Owens Shark PT  Norwood Levo 07/27/2016, 11:40 AM  [place], [place], [address] Phone: [phone]   Fax:  [phone]  Name: HEYLIN DARGAN MRN: [id number] Date of Birth: 1961/04/12

## 2016-07-30 ENCOUNTER — Other Ambulatory Visit: Payer: Self-pay | Admitting: *Deleted

## 2016-07-30 DIAGNOSIS — Z170 Estrogen receptor positive status [ER+]: Principal | ICD-10-CM

## 2016-07-30 DIAGNOSIS — C50412 Malignant neoplasm of upper-outer quadrant of left female breast: Secondary | ICD-10-CM

## 2016-07-31 ENCOUNTER — Other Ambulatory Visit: Payer: Self-pay | Admitting: Emergency Medicine

## 2016-07-31 ENCOUNTER — Encounter: Payer: 59 | Admitting: Physical Therapy

## 2016-08-01 ENCOUNTER — Telehealth: Payer: Self-pay | Admitting: Oncology

## 2016-08-01 ENCOUNTER — Ambulatory Visit: Payer: 59

## 2016-08-01 DIAGNOSIS — M25512 Pain in left shoulder: Secondary | ICD-10-CM

## 2016-08-01 DIAGNOSIS — I890 Lymphedema, not elsewhere classified: Secondary | ICD-10-CM

## 2016-08-01 DIAGNOSIS — R221 Localized swelling, mass and lump, neck: Secondary | ICD-10-CM

## 2016-08-01 DIAGNOSIS — R293 Abnormal posture: Secondary | ICD-10-CM

## 2016-08-01 NOTE — Telephone Encounter (Signed)
Faxed last office note to [facility] health [phone]

## 2016-08-01 NOTE — Therapy (Signed)
[place], [place], [address] Phone: [phone]   Fax:  [phone]  Physical Therapy Treatment  Patient Details  Name: Alison [name] MRN: [id number] Date of Birth: 02-05-1961 Referring Provider: [name]   Encounter Date: 08/01/2016      PT End of Session - 08/01/16 1213    Visit Number 9   Number of Visits 17   Date for PT Re-Evaluation 08/24/16   PT Start Time 1104   PT Stop Time 1216   PT Time Calculation (min) 72 min   Activity Tolerance Patient tolerated treatment well   Behavior During Therapy [facility] for tasks assessed/performed      Past Medical History:  Diagnosis Date  . Asthma     seasonal asthma  . Breast cancer ([place])   . Complication of anesthesia 2007   aspiration pneumonia post hysterectomy.  Blood pressure would drop low- but no problems 01/2016- surgery  . History of blood transfusion   . History of radiation therapy 04/02/16- 05/21/16   Left Chest Wall, SCV, Axilla, IM nodes  . Hypertension   . Neuropathy ([place])    with chemo  . Osteopenia determined by x-ray   . Pneumonia    1980's and 1990's  . Raynaud's disease   . Rectovaginal fistula    repaired 2014  . Skin cancer 2014   basal- back    Past Surgical History:  Procedure Laterality Date  . ABDOMINAL HYSTERECTOMY  2007   with BSO  . APPENDECTOMY  2007  . AXILLARY LYMPH NODE DISSECTION Left 02/14/2016  . AXILLARY LYMPH NODE DISSECTION Left 02/14/2016   Procedure: LEFT AXILLARY LYMPH NODE DISSECTION;  Surgeon: [name], MD;  Location: [place];  Service: General;  Laterality: Left;  . BREAST RECONSTRUCTION WITH PLACEMENT OF TISSUE EXPANDER AND FLEX HD (ACELLULAR HYDRATED DERMIS) Left 02/02/2016   Procedure: IMMEDIATE LEFT BREAST RECONSTRUCTION WITH PLACEMENT OF TISSUE EXPANDER AND FLEX HD (ACELLULAR HYDRATED DERMIS);  Surgeon: [name], DO;  Location: [place];  Service: Plastics;   Laterality: Left;  . BREAST SURGERY Left 02/02/16   mastectomy with reconstruction  . c-section    . COLON RESECTION  4/'2014  . HERNIA REPAIR    . Port- a- cath placement  07/2015  . PORT-A-CATH REMOVAL Right 02/02/2016   Procedure: REMOVAL PORT-A-CATH;  Surgeon: [name], MD;  Location: [place];  Service: General;  Laterality: Right;  . RADIOACTIVE SEED GUIDED AXILLARY SENTINEL LYMPH NODE Left 02/02/2016   Procedure: LEFT BREAST MASTECTOMY AND RADIOACTIVE SEED GUIDED LEFT SENTINEL LYMPH NODE BIOPSY ;  Surgeon: [name], MD;  Location: [place];  Service: General;  Laterality: Left;    There were no vitals filed for this visit.      Subjective Assessment - 08/01/16 1117    Subjective Bobby from [employer] is coming today at 2:30 for my demonstration and I'm covered 100% so I'm excited to get that because I feel so good after therapy! The kinesiotape has been helping and want to cont that. Been trying to wear the chip pack in my axilla and it's good when it stays.   Pertinent History Breast cancer diagnosed November2016 She had chemotherapy. but did have some neuropathy so it had to be stopped. On June 8, she had a mastectomy with immedicate expander placement and with first set of lymphnodes removed, On June 20 she went for complete axillary lymph node dissection. She says she has  had always been "full necked" but has neck and upper body fullness since the cancer was diagnosed. She reports she has osteopenia in low back and has had a fracture that was discovered in 2014 when she had a Dexa scan.   Patient Stated Goals to decrease the edema   Currently in Pain? Yes   Pain Score 5    Pain Location Rib cage   Pain Orientation Right   Pain Descriptors / Indicators Sharp   Pain Type Acute pain   Pain Onset In the past 7 days   Aggravating Factors  getting out of bed   Pain Relieving Factors stretching                         [facility]  Adult PT Treatment/Exercise - 08/01/16 0001      Self-Care   Other Self-Care Comments  Answered pts questions in regards to best way to don chip pack to axilla and best times to do this, like when she is sitting down resting with limited UE movement which will cause it to slide. Also had pt try placing it on herself.      Manual Therapy   Manual Therapy Myofascial release;Manual Lymphatic Drainage (MLD)   Myofascial Release To Lt axilla concurrently with UE pulling   Manual Lymphatic Drainage (MLD) in supine short neck, superficial and deep abdominals, right axillary nodes, anterior interaxillary anastamosis, anterior and posterior neck, then to right sidelying for posterior interaxillary anastamosis. Instructed pt after per handout and included Lt inguiunal nodes and Lt axillo-inguinal anastomosis for when she is working on Lt axilla swelling.    Kinesiotex Edema     Kinesiotix   Edema Removed tape from last session, skin looked good except some redness near "bucket" so placed tape off to the side of this area today. Skinkote used, then fanshape from left axilla and left back toward low back.                 PT Education - 08/01/16 1213    Education provided Yes   Education Details Self manual lymph drainage   Person(s) Educated Patient   Methods Explanation;Demonstration;Handout   Comprehension Verbalized understanding;Returned demonstration;Need further instruction                [facility] Goals - 08/01/16 1229      CC Long Term Goal  #3   Title Patient will be independent in self manual lymph drainage.   Baseline needs review ; reviewed with pt and issued handout-08/01/16   Status Achieved     CC Long Term Goal  #5   Title Patient will utilize chip pack for at least 2 hours per day at home to assess whether compression garment would be beneficial.   Baseline chip packs help, but fluid returns when chip pack is not used ; pt using chip pack, working on being  more consistent while having it stay in place-08/01/16   Status Partially Met            Plan - 08/01/16 1225    Clinical Impression Statement Reviewed placement of chip pack per pts request and instructed her best times to wear it is during limited UE ROM, like sitting watching TV, to decrease the chip pack sliding around on her which is problem she's been having. She did well with instruction of self manual lymph drainage today returning good demonstration and proper technique. She is getting her [employer] demonstration  today which she is excited about.    Rehab Potential Good   Clinical Impairments Affecting Rehab Potential chemotherapy with neuropathy    PT Frequency 2x / week   PT Duration 4 weeks   PT Treatment/Interventions ADLs/Self Care Home Management;Manual lymph drainage;Compression bandaging;Scar mobilization;Passive range of motion;Patient/family education;Taping;Manual techniques;Therapeutic exercise;Therapeutic activities;Orthotic Fit/Training;DME Instruction   PT Next Visit Plan Continue with manual lymph drainage; cont kinesiotape per skin integrity, and resume exercise    Consulted and Agree with Plan of Care Patient      Patient will benefit from skilled therapeutic intervention in order to improve the following deficits and impairments:  Decreased range of motion, Impaired UE functional use, Decreased activity tolerance, Decreased knowledge of precautions, Decreased skin integrity, Impaired perceived functional ability, Pain, Decreased knowledge of use of DME, Increased edema, Postural dysfunction, Decreased strength, Impaired sensation, Decreased scar mobility  Visit Diagnosis: Abnormal posture  Lymphedema, not elsewhere classified  Localized swelling, mass and lump, neck  Left shoulder pain, unspecified chronicity     Problem List Patient Active Problem List   Diagnosis Date Noted  . Primary malignant neoplasm of left breast with stage 2 nodal metastasis  per American Joint Committee on Cancer 7th edition (N2) ([place]) 02/14/2016  . 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 Insomnia 09/01/2015  . Breast cancer of upper-outer quadrant of left female breast ([place]) 08/08/2015    Otelia Limes, PTA 08/01/2016, 12:32 PM  [place], [place], [address] Phone: [phone]   Fax:  [phone]  Name: Alison [name] MRN: [id number] Date of Birth: 07-05-1961

## 2016-08-01 NOTE — Patient Instructions (Addendum)
Self manual lymph drainage: Perform this sequence once a day.  Only give enough pressure no your skin to make the skin move.  Start with circles near neck above collarbones 5-10 times.  Diaphragmatic - Supine   Inhale through nose making navel move out toward hands. Exhale through puckered lips, hands follow navel in. Repeat _5__ times. Rest _10__ seconds between repeats.   Copyright  VHI. All rights reserved.  Hug yourself.  Do circles at your neck just above your collarbones.  Repeat this 10 times.  Axilla - One at a Time   Using full weight of flat hand and fingers at center of uninvolved armpit, make _10__ in-place circles.   Copyright  VHI. All rights reserved.  LEG: Inguinal Nodes Stimulation   With small finger side of hand against hip crease on involved side, gently perform circles at the crease. Repeat __10_ times.  1) Now gently stretch skin from the involved side to the uninvolved side across the chest at the shoulder line.  Repeat that 4 times.  Copyright  VHI. All rights reserved.  Axilla to Inguinal Nodes - Sweep   On involved side, pump _4__ times from armpit along side of trunk to hip crease.  Finish by doing the pathways as described above going from your involved armpit to the same side groin and going across your upper chest from the involved shoulder to the uninvolved shoulder.  Repeat the steps above where you do circles in your left groin and right armpit. Copyright  VHI. All rights reserved.

## 2016-08-02 ENCOUNTER — Ambulatory Visit: Payer: 59 | Admitting: Physical Therapy

## 2016-08-02 ENCOUNTER — Encounter: Payer: Self-pay | Admitting: Oncology

## 2016-08-02 DIAGNOSIS — I890 Lymphedema, not elsewhere classified: Secondary | ICD-10-CM

## 2016-08-02 DIAGNOSIS — M25512 Pain in left shoulder: Secondary | ICD-10-CM

## 2016-08-02 DIAGNOSIS — M25612 Stiffness of left shoulder, not elsewhere classified: Secondary | ICD-10-CM

## 2016-08-02 DIAGNOSIS — R293 Abnormal posture: Secondary | ICD-10-CM

## 2016-08-02 DIAGNOSIS — R221 Localized swelling, mass and lump, neck: Secondary | ICD-10-CM

## 2016-08-02 NOTE — Therapy (Signed)
[place], [place], [address] Phone: [phone]   Fax:  [phone]  Physical Therapy Treatment  Patient Details  Name: Alison [name] MRN: [id number] Date of Birth: December 09, 1960 Referring Provider: [name]   Encounter Date: 08/02/2016      PT End of Session - 08/02/16 1718    Visit Number 10   Number of Visits 17   Date for PT Re-Evaluation 08/24/16   PT Start Time 1300   PT Stop Time 1345   PT Time Calculation (min) 45 min   Activity Tolerance Patient tolerated treatment well   Behavior During Therapy [facility] for tasks assessed/performed      Past Medical History:  Diagnosis Date  . Asthma     seasonal asthma  . Breast cancer ([place])   . Complication of anesthesia 2007   aspiration pneumonia post hysterectomy.  Blood pressure would drop low- but no problems 01/2016- surgery  . History of blood transfusion   . History of radiation therapy 04/02/16- 05/21/16   Left Chest Wall, SCV, Axilla, IM nodes  . Hypertension   . Neuropathy ([place])    with chemo  . Osteopenia determined by x-ray   . Pneumonia    1980's and 1990's  . Raynaud's disease   . Rectovaginal fistula    repaired 2014  . Skin cancer 2014   basal- back    Past Surgical History:  Procedure Laterality Date  . ABDOMINAL HYSTERECTOMY  2007   with BSO  . APPENDECTOMY  2007  . AXILLARY LYMPH NODE DISSECTION Left 02/14/2016  . AXILLARY LYMPH NODE DISSECTION Left 02/14/2016   Procedure: LEFT AXILLARY LYMPH NODE DISSECTION;  Surgeon: [name], MD;  Location: [place];  Service: General;  Laterality: Left;  . BREAST RECONSTRUCTION WITH PLACEMENT OF TISSUE EXPANDER AND FLEX HD (ACELLULAR HYDRATED DERMIS) Left 02/02/2016   Procedure: IMMEDIATE LEFT BREAST RECONSTRUCTION WITH PLACEMENT OF TISSUE EXPANDER AND FLEX HD (ACELLULAR HYDRATED DERMIS);  Surgeon: [name], DO;  Location: [place];  Service: Plastics;   Laterality: Left;  . BREAST SURGERY Left 02/02/16   mastectomy with reconstruction  . c-section    . COLON RESECTION  4/'2014  . HERNIA REPAIR    . Port- a- cath placement  07/2015  . PORT-A-CATH REMOVAL Right 02/02/2016   Procedure: REMOVAL PORT-A-CATH;  Surgeon: [name], MD;  Location: [place];  Service: General;  Laterality: Right;  . RADIOACTIVE SEED GUIDED AXILLARY SENTINEL LYMPH NODE Left 02/02/2016   Procedure: LEFT BREAST MASTECTOMY AND RADIOACTIVE SEED GUIDED LEFT SENTINEL LYMPH NODE BIOPSY ;  Surgeon: [name], MD;  Location: [place];  Service: General;  Laterality: Left;    There were no vitals filed for this visit.      Subjective Assessment - 08/02/16 1314    Subjective Things went well with the Flexitouch yesterday and she is going to move forward with that.  She has some armpit pain earlier this morning, but is better today.    Pertinent History Breast cancer diagnosed November2016 She had chemotherapy. but did have some neuropathy so it had to be stopped. On June 8, she had a mastectomy with immedicate expander placement and with first set of lymphnodes removed, On June 20 she went for complete axillary lymph node dissection. She says she has had always been "full necked" but has neck and upper body fullness since the cancer was diagnosed. She reports she has osteopenia in low  back and has had a fracture that was discovered in 2014 when she had a Dexa scan.   Patient Stated Goals to decrease the edema   Currently in Pain? Yes   Pain Score 5    Pain Location Axilla   Pain Orientation Left   Pain Type Chronic pain               LYMPHEDEMA/ONCOLOGY QUESTIONNAIRE - 08/02/16 1309      Head and Neck   4 cm superior to sternal notch around neck 53 cm   6 cm superior to sternal notch around neck 51.5 cm   8 cm superior to sternal notch around neck 51 cm                  [facility] Adult PT Treatment/Exercise - 08/02/16  0001      Manual Therapy   Manual Lymphatic Drainage (MLD) in supine short neck, superficial and deep abdominals, right axillary nodes, anterior interaxillary anastamosis, anterior and posterior neck, then to right sidelying for posterior interaxillary anastamosis. Instructed pt after per handout and included Lt inguiunal nodes and Lt axillo-inguinal anastomosis for when she is working on Lt axilla swelling.    Kinesiotex Edema     Kinesiotix   Edema Skinkote used, then fanshape from left axilla and left back toward low back                PT Education - 08/01/16 1213    Education provided Yes   Education Details Self manual lymph drainage   Person(s) Educated Patient   Methods Explanation;Demonstration;Handout   Comprehension Verbalized understanding;Returned demonstration;Need further instruction                [facility] Goals - 08/01/16 1229      CC Long Term Goal  #3   Title Patient will be independent in self manual lymph drainage.   Baseline needs review ; reviewed with pt and issued handout-08/01/16   Status Achieved     CC Long Term Goal  #5   Title Patient will utilize chip pack for at least 2 hours per day at home to assess whether compression garment would be beneficial.   Baseline chip packs help, but fluid returns when chip pack is not used ; pt using chip pack, working on being more consistent while having it stay in place-08/01/16   Status Partially Met            Plan - 08/02/16 1719    Clinical Impression Statement Pt is improving with reductions in neck circumference measurements and overall feeling better.  She is happy to be moving forward with Flexitouch.    Rehab Potential Good   Clinical Impairments Affecting Rehab Potential chemotherapy with neuropathy    PT Frequency 2x / week   PT Duration 4 weeks   PT Treatment/Interventions ADLs/Self Care Home Management;Manual lymph drainage;Compression bandaging;Scar mobilization;Passive  range of motion;Patient/family education;Taping;Manual techniques;Therapeutic exercise;Therapeutic activities;Orthotic Fit/Training;DME Instruction   PT Next Visit Plan Continue with manual lymph drainage; cont kinesiotape per skin integrity, and resume exercise       Patient will benefit from skilled therapeutic intervention in order to improve the following deficits and impairments:  Decreased range of motion, Impaired UE functional use, Decreased activity tolerance, Decreased knowledge of precautions, Decreased skin integrity, Impaired perceived functional ability, Pain, Decreased knowledge of use of DME, Increased edema, Postural dysfunction, Decreased strength, Impaired sensation, Decreased scar mobility  Visit Diagnosis: Abnormal posture  Lymphedema, not elsewhere  classified  Localized swelling, mass and lump, neck  Left shoulder pain, unspecified chronicity  Stiffness of left shoulder joint     Problem List Patient Active Problem List   Diagnosis Date Noted  . Primary malignant neoplasm of left breast with stage 2 nodal metastasis per American Joint Committee on Cancer 7th edition (N2) ([place]) 02/14/2016  . 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 Insomnia 09/01/2015  . Breast cancer of upper-outer quadrant of left female breast ([place]) 08/08/2015   Donato Heinz. Owens Shark PT  [place] 08/02/2016, [place], [place], [address] Phone: [phone]   Fax:  [phone]  Name: JOSALIN CARNEIRO MRN: [id number] Date of Birth: [birth year]

## 2016-08-07 ENCOUNTER — Ambulatory Visit: Payer: 59 | Admitting: Physical Therapy

## 2016-08-07 DIAGNOSIS — M25612 Stiffness of left shoulder, not elsewhere classified: Secondary | ICD-10-CM

## 2016-08-07 DIAGNOSIS — R293 Abnormal posture: Secondary | ICD-10-CM

## 2016-08-07 DIAGNOSIS — M25512 Pain in left shoulder: Secondary | ICD-10-CM

## 2016-08-07 DIAGNOSIS — I890 Lymphedema, not elsewhere classified: Secondary | ICD-10-CM

## 2016-08-07 DIAGNOSIS — R221 Localized swelling, mass and lump, neck: Secondary | ICD-10-CM

## 2016-08-07 NOTE — Therapy (Signed)
[place], [place], [address] Phone: [phone]   Fax:  [phone]  Physical Therapy Treatment  Patient Details  Name: Alison [name] MRN: [id number] Date of Birth: [birth year] Referring Provider: [name]   Encounter Date: 08/07/2016      PT End of Session - 08/07/16 1653    Visit Number 11   Number of Visits 17   Date for PT Re-Evaluation 08/24/16   PT Start Time 1300   PT Stop Time 1345   PT Time Calculation (min) 45 min      Past Medical History:  Diagnosis Date  . Asthma     seasonal asthma  . Breast cancer ([place])   . Complication of anesthesia 2007   aspiration pneumonia post hysterectomy.  Blood pressure would drop low- but no problems 01/2016- surgery  . History of blood transfusion   . History of radiation therapy 04/02/16- 05/21/16   Left Chest Wall, SCV, Axilla, IM nodes  . Hypertension   . Neuropathy ([place])    with chemo  . Osteopenia determined by x-ray   . Pneumonia    1980's and 1990's  . Raynaud's disease   . Rectovaginal fistula    repaired 2014  . Skin cancer 2014   basal- back    Past Surgical History:  Procedure Laterality Date  . ABDOMINAL HYSTERECTOMY  2007   with BSO  . APPENDECTOMY  2007  . AXILLARY LYMPH NODE DISSECTION Left 02/14/2016  . AXILLARY LYMPH NODE DISSECTION Left 02/14/2016   Procedure: LEFT AXILLARY LYMPH NODE DISSECTION;  Surgeon: [name], MD;  Location: [facility];  Service: General;  Laterality: Left;  . BREAST RECONSTRUCTION WITH PLACEMENT OF TISSUE EXPANDER AND FLEX HD (ACELLULAR HYDRATED DERMIS) Left 02/02/2016   Procedure: IMMEDIATE LEFT BREAST RECONSTRUCTION WITH PLACEMENT OF TISSUE EXPANDER AND FLEX HD (ACELLULAR HYDRATED DERMIS);  Surgeon: [name], DO;  Location: [facility];  Service: Plastics;  Laterality: Left;  . BREAST SURGERY Left 02/02/16   mastectomy with reconstruction  . c-section    . COLON RESECTION   4/'2014  . HERNIA REPAIR    . Port- a- cath placement  07/2015  . PORT-A-CATH REMOVAL Right 02/02/2016   Procedure: REMOVAL PORT-A-CATH;  Surgeon: [name], MD;  Location: [facility];  Service: General;  Laterality: Right;  . RADIOACTIVE SEED GUIDED AXILLARY SENTINEL LYMPH NODE Left 02/02/2016   Procedure: LEFT BREAST MASTECTOMY AND RADIOACTIVE SEED GUIDED LEFT SENTINEL LYMPH NODE BIOPSY ;  Surgeon: [name], MD;  Location: [facility];  Service: General;  Laterality: Left;    There were no vitals filed for this visit.      Subjective Assessment - 08/07/16 1308    Subjective Pt slipped on the steps this morning, and held on with her right arm and so has some soreness there.  She has been using her chip pack frequently and notices improvement in anterior neck    Pertinent History Breast cancer diagnosed November2016 She had chemotherapy. but did have some neuropathy so it had to be stopped. On June 8, she had a mastectomy with immedicate expander placement and with first set of lymphnodes removed, On June 20 she went for complete axillary lymph node dissection. She says she has had always been "full necked" but has neck and upper body fullness since the cancer was diagnosed. She reports she has osteopenia in low back and has had a fracture that was discovered in 2014  when she had a Dexa scan.   Patient Stated Goals to decrease the edema   Currently in Pain? Yes   Pain Score 6    Pain Location Arm   Pain Orientation Right   Pain Descriptors / Indicators Sharp   Pain Type Acute pain                         [facility] Adult PT Treatment/Exercise - 08/07/16 0001      Self-Care   Other Self-Care Comments  showed pt options for head and neck jovipak extended chip pack garment and she agreed.       Lumbar Exercises: Aerobic   Stationary Bike 12.5 minutes at level one      Shoulder Exercises: Supine   Other Supine Exercises dowel rod flexion x 15  reps     Manual Therapy   Manual Lymphatic Drainage (MLD) in supine short neck, superficial and deep abdominals, right axillary nodes, anterior interaxillary anastamosis, anterior and posterior neck, then to right sidelying for posterior interaxillary anastamosis. Instructed pt after per handout and included Lt inguiunal nodes and Lt axillo-inguinal anastomosis for when she is working on Lt axilla swelling.    Kinesiotex Edema     Kinesiotix   Edema Skinkote used, then fanshape from left axilla and left back toward low back                        Long Term Clinic Goals - 08/02/16 1721      CC Long Term Goal  #1   Title Patient will report a decrease in left shoulder pain by 75% so she can perform daily activities with greater ease   Status On-going     CC Long Term Goal  #2   Title Reduce circumference measurement at 6 cm superior to sternal notch to 50 cm or less.   Baseline 54.4 at eval, 52 cm at 06/26/2016 . 51 cm on 07/25/2016   Status On-going     CC Long Term Goal  #3   Title Patient will be independent in self manual lymph drainage.   Status Achieved     CC Long Term Goal  #4   Title Patient will increase right shoulder flexion AROM to 140 degrees without any pain for improved ability to perform ADLs.   Status On-going     CC Long Term Goal  #5   Title Patient will utilize chip pack for at least 2 hours per day at home to assess whether compression garment would be beneficial.   Baseline chip packs help, but fluid returns when chip pack is not used ; pt using chip pack, working on being more consistent while having it stay in place-08/01/16   Status Partially Met     CC Long Term Goal  #6   Title Pt will be independent in [place] program    Status On-going            Plan - 08/07/16 1654    Clinical Impression Statement Despite slipping on the steps today, pt seems to be improving.  She wants to get a more substantial head and neck garment  since she has been having some success with chip pack. upgraded exercise to incule stationary bike    Rehab Potential Good   Clinical Impairments Affecting Rehab Potential chemotherapy with neuropathy    PT Frequency 2x / week   PT Duration 4 weeks   PT  Treatment/Interventions ADLs/Self Care Home Management;Manual lymph drainage;Compression bandaging;Scar mobilization;Passive range of motion;Patient/family education;Taping;Manual techniques;Therapeutic exercise;Therapeutic activities;Orthotic Fit/Training;DME Instruction   PT Next Visit Plan Continue with manual lymph drainage; cont kinesiotape per skin integrity, and resume exercise including stationary bike       Patient will benefit from skilled therapeutic intervention in order to improve the following deficits and impairments:  Decreased range of motion, Impaired UE functional use, Decreased activity tolerance, Decreased knowledge of precautions, Decreased skin integrity, Impaired perceived functional ability, Pain, Decreased knowledge of use of DME, Increased edema, Postural dysfunction, Decreased strength, Impaired sensation, Decreased scar mobility  Visit Diagnosis: Abnormal posture  Lymphedema, not elsewhere classified  Localized swelling, mass and lump, neck  Left shoulder pain, unspecified chronicity  Stiffness of left shoulder joint     Problem List Patient Active Problem List   Diagnosis Date Noted  . Primary malignant neoplasm of left breast with stage 2 nodal metastasis per American Joint Committee on Cancer 7th edition (N2) ([facility]) 02/14/2016  . 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 Insomnia 09/01/2015  . Breast cancer of upper-outer quadrant of left female breast ([place]) 08/08/2015   Donato Heinz. Owens Shark PT  Norwood Levo 08/07/2016, 4:57 PM  [place], [place], [address] Phone: [phone]   Fax:  [phone]  Name:  Alison [name] MRN: [id number] Date of Birth: 1961-02-17

## 2016-08-08 ENCOUNTER — Encounter: Payer: 59 | Admitting: *Deleted

## 2016-08-08 ENCOUNTER — Other Ambulatory Visit (HOSPITAL_BASED_OUTPATIENT_CLINIC_OR_DEPARTMENT_OTHER): Payer: 59

## 2016-08-08 DIAGNOSIS — C50412 Malignant neoplasm of upper-outer quadrant of left female breast: Secondary | ICD-10-CM | POA: Diagnosis not present

## 2016-08-08 DIAGNOSIS — C50912 Malignant neoplasm of unspecified site of left female breast: Secondary | ICD-10-CM

## 2016-08-08 DIAGNOSIS — C779 Secondary and unspecified malignant neoplasm of lymph node, unspecified: Principal | ICD-10-CM

## 2016-08-08 DIAGNOSIS — Z170 Estrogen receptor positive status [ER+]: Principal | ICD-10-CM

## 2016-08-08 DIAGNOSIS — Z006 Encounter for examination for normal comparison and control in clinical research program: Secondary | ICD-10-CM | POA: Diagnosis not present

## 2016-08-08 LAB — COMPREHENSIVE METABOLIC PANEL
ALT: 28 U/L (ref 0–55)
ANION GAP: 10 meq/L (ref 3–11)
AST: 28 U/L (ref 5–34)
Albumin: 3.8 g/dL (ref 3.5–5.0)
Alkaline Phosphatase: 57 U/L (ref 40–150)
BUN: 22.1 mg/dL (ref 7.0–26.0)
CHLORIDE: 108 meq/L (ref 98–109)
CO2: 23 meq/L (ref 22–29)
Calcium: 9.9 mg/dL (ref 8.4–10.4)
Creatinine: 1.1 mg/dL (ref 0.6–1.1)
EGFR: 58 mL/min/{1.73_m2} — AB (ref >90)
Glucose: 112 mg/dl (ref 70–140)
POTASSIUM: 4.5 meq/L (ref 3.5–5.1)
Sodium: 141 mEq/L (ref 136–145)
Total Bilirubin: 0.26 mg/dL (ref 0.20–1.20)
Total Protein: 7.3 g/dL (ref 6.4–8.3)

## 2016-08-08 LAB — CBC WITH DIFFERENTIAL/PLATELET
BASO%: 2 % (ref 0.0–2.0)
Basophils Absolute: 0 10*3/uL (ref 0.0–0.1)
EOS ABS: 0.1 10*3/uL (ref 0.0–0.5)
EOS%: 6.1 % (ref 0.0–7.0)
HCT: 35.5 % (ref 34.8–46.6)
HGB: 11.7 g/dL (ref 11.6–15.9)
LYMPH%: 40.2 % (ref 14.0–49.7)
MCH: 30 pg (ref 25.1–34.0)
MCHC: 32.9 g/dL (ref 31.5–36.0)
MCV: 90.9 fL (ref 79.5–101.0)
MONO#: 0.1 10*3/uL (ref 0.1–0.9)
MONO%: 7.9 % (ref 0.0–14.0)
NEUT#: 0.8 10*3/uL — ABNORMAL LOW (ref 1.5–6.5)
NEUT%: 43.8 % (ref 38.4–76.8)
PLATELETS: 260 10*3/uL (ref 145–400)
RBC: 3.9 10*6/uL (ref 3.70–5.45)
RDW: 13.6 % (ref 11.2–14.5)
WBC: 1.8 10*3/uL — ABNORMAL LOW (ref 3.9–10.3)
lymph#: 0.7 10*3/uL — ABNORMAL LOW (ref 0.9–3.3)

## 2016-08-08 LAB — RESEARCH LABS

## 2016-08-08 MED ORDER — INV-PALBOCICLIB 125 MG CAPS #23 ALLIANCE FOUNDATION AFT-05 (PALLAS): 125.0000 mg | ORAL_CAPSULE | Freq: Every day | ORAL | 0 refills | Status: DC

## 2016-08-08 MED ORDER — INV-ASPIRIN/PLACEBO 300 MG TABS ALLIANCE A011502: 1.0000 | ORAL_TABLET | Freq: Every day | ORAL | 0 refills | Status: DC

## 2016-08-08 NOTE — Progress Notes (Signed)
08/08/2016 Patient in to clinic unaccompanied today for dual enrollment studies routine visits, including AFT-05 PALLAS (Cycle 1, Day 14) and [facility] [id number] ABC (Baseline visit).   AFT-05 PALLAS - Cycle 1, Day 14 Patient reports that she has ongoing fatigue, however, it does not seem to be any worse than usual. She does report taking Tylenol today for back pain, although this is a complaint that she had prior to study treatment. She reports similar problems with walking due to neuropathy sensations in her feet that have been present since receiving chemotherapy. Otherwise she has no new complaints to report. Due to [place] of 800 today, remaining palbociclib for Cycle 1 will be held. Patient instructed to take no further doses of palbociclib for this cycle, and to return her remaining tablets and patient medication diaries at the next clinic visit in two weeks. Patient instructed to continue taking anastrozole daily. Copy of CBC results given to patient along with written instructions to hold palbociclib until reassessed in two weeks. Patient verbalized understanding.  [facility] [id number] ABC - Baseline visit Patient in to clinic today for baseline visit after randomization, and prior to treatment start. Upon arrival to the clinic, per protocol section 5.0, footnote 2, patient completed the Registration Fatigue/[place] assessment, located on page 9 of the Baseline patient questionnaire. Patient then proceeded to the lab for collection of blood and urine samples for the [id number] study at baseline. Study medication was prepared by pharmacist Raul Del, and was given to the patient today as a 6- month supply of 2 bottles with 90 tablets each. Reviewed patient administration information as outlined on page 1 of the Aspirin/Placebo Medication Log. Provided instructions for patient to complete the diary with daily dosing beginning today. Medication diaries for December 2017 through June 2018 were provided to the  patient. Instructed patient to return the medication bottles, either empty or partial, along with medication diaries at the Month 6 visit. Patient verbalized understanding. Patient has no history of bruising, gastritis, stomach pain, headache or bleeding from any area. She reports occasional episodes of heartburn or indigestion, not requiring treatment. She denies taking any aspirin or medications containing NSAIDs, and is currently taking Tylenol instead of ibuprofen for minor musculoskeletal pain.  Cindy S. Brigitte Pulse BSN, RN, [place] 08/08/2016 2:06 PM

## 2016-08-09 ENCOUNTER — Telehealth: Payer: Self-pay | Admitting: Hematology

## 2016-08-09 ENCOUNTER — Ambulatory Visit: Payer: 59 | Admitting: Physical Therapy

## 2016-08-09 DIAGNOSIS — M25512 Pain in left shoulder: Secondary | ICD-10-CM

## 2016-08-09 DIAGNOSIS — R293 Abnormal posture: Secondary | ICD-10-CM

## 2016-08-09 DIAGNOSIS — M25612 Stiffness of left shoulder, not elsewhere classified: Secondary | ICD-10-CM

## 2016-08-09 DIAGNOSIS — I890 Lymphedema, not elsewhere classified: Secondary | ICD-10-CM

## 2016-08-09 DIAGNOSIS — R221 Localized swelling, mass and lump, neck: Secondary | ICD-10-CM

## 2016-08-09 NOTE — Patient Instructions (Signed)
Over Head Pull: Narrow Grip     DO ALL EXERCISES IN SITTING AND WHEN THAT GETS EASY DO IT IN STANDING       On back, knees bent, feet flat, band across thighs, elbows straight but relaxed. Pull hands apart (start). Keeping elbows straight, bring arms up and over head, hands toward floor. Keep pull steady on band. Hold momentarily. Return slowly, keeping pull steady, back to start. Repeat _5-10__ times. Band color _YELLOW_____   Side Pull: Double Arm   On back, knees bent, feet flat. Arms perpendicular to body, shoulder level, elbows straight but relaxed. Pull arms out to sides, elbows straight. Resistance band comes across collarbones, hands toward floor. Hold momentarily. Slowly return to starting position. Repeat 5-10___ times. Band color _YELLOW____   Sash   On back, knees bent, feet flat, left hand on left hip, right hand above left. Pull right arm DIAGONALLY (hip to shoulder) across chest. Bring right arm along head toward floor. Hold momentarily. Slowly return to starting position. Repeat 5-10___ times. Do with left arm. Band color __YELLOW____   Shoulder Rotation: Double Arm   On back, knees bent, feet flat, elbows tucked at sides, bent 90, hands palms up. Pull hands apart and down toward floor, keeping elbows near sides. Hold momentarily. Slowly return to starting position. Repeat _5-10__ times. Band color __YELLOW____

## 2016-08-09 NOTE — Telephone Encounter (Signed)
Received msg to reschedule appt per Dr. Burr Medico. Tc to pt to reschedule appt from 12/26. Appt has been rescheduled to [facility], 12/18 at 230pm with labs at 2pm. Pt agreed.

## 2016-08-09 NOTE — Therapy (Signed)
[place], [place], [address] Phone: [phone]   Fax:  [phone]  Physical Therapy Treatment  Patient Details  Name: Alison [name] MRN: [id number] Date of Birth: May 05, 1961 Referring Provider: [name]   Encounter Date: 08/09/2016      PT End of Session - 08/09/16 1425    Visit Number 12   Number of Visits 17   Date for PT Re-Evaluation 08/24/16   PT Start Time 1300   PT Stop Time 1345   PT Time Calculation (min) 45 min   Activity Tolerance Patient tolerated treatment well   Behavior During Therapy [facility] for tasks assessed/performed      Past Medical History:  Diagnosis Date  . Asthma     seasonal asthma  . Breast cancer ([place])   . Complication of anesthesia 2007   aspiration pneumonia post hysterectomy.  Blood pressure would drop low- but no problems 01/2016- surgery  . History of blood transfusion   . History of radiation therapy 04/02/16- 05/21/16   Left Chest Wall, SCV, Axilla, IM nodes  . Hypertension   . Neuropathy ([place])    with chemo  . Osteopenia determined by x-ray   . Pneumonia    1980's and 1990's  . Raynaud's disease   . Rectovaginal fistula    repaired 2014  . Skin cancer 2014   basal- back    Past Surgical History:  Procedure Laterality Date  . ABDOMINAL HYSTERECTOMY  2007   with BSO  . APPENDECTOMY  2007  . AXILLARY LYMPH NODE DISSECTION Left 02/14/2016  . AXILLARY LYMPH NODE DISSECTION Left 02/14/2016   Procedure: LEFT AXILLARY LYMPH NODE DISSECTION;  Surgeon: [name], MD;  Location: [facility];  Service: General;  Laterality: Left;  . BREAST RECONSTRUCTION WITH PLACEMENT OF TISSUE EXPANDER AND FLEX HD (ACELLULAR HYDRATED DERMIS) Left 02/02/2016   Procedure: IMMEDIATE LEFT BREAST RECONSTRUCTION WITH PLACEMENT OF TISSUE EXPANDER AND FLEX HD (ACELLULAR HYDRATED DERMIS);  Surgeon: [name], DO;  Location: [facility];  Service: Plastics;   Laterality: Left;  . BREAST SURGERY Left 02/02/16   mastectomy with reconstruction  . c-section    . COLON RESECTION  4/'2014  . HERNIA REPAIR    . Port- a- cath placement  07/2015  . PORT-A-CATH REMOVAL Right 02/02/2016   Procedure: REMOVAL PORT-A-CATH;  Surgeon: [name], MD;  Location: [facility];  Service: General;  Laterality: Right;  . RADIOACTIVE SEED GUIDED AXILLARY SENTINEL LYMPH NODE Left 02/02/2016   Procedure: LEFT BREAST MASTECTOMY AND RADIOACTIVE SEED GUIDED LEFT SENTINEL LYMPH NODE BIOPSY ;  Surgeon: [name], MD;  Location: [facility];  Service: General;  Laterality: Left;    There were no vitals filed for this visit.      Subjective Assessment - 08/09/16 1316    Subjective Pt reports she is doing better, She is still having some pain in right shoulder.  She has had to take a break from a study drug because her while blood cell count has dropped.     Pertinent History Breast cancer diagnosed November2016 She had chemotherapy. but did have some neuropathy so it had to be stopped. On June 8, she had a mastectomy with immedicate expander placement and with first set of lymphnodes removed, On June 20 she went for complete axillary lymph node dissection. She says she has had always been "full necked" but has neck and upper body fullness since the cancer was diagnosed.  She reports she has osteopenia in low back and has had a fracture that was discovered in 2014 when she had a Dexa scan.   Patient Stated Goals to decrease the edema   Currently in Pain? Yes   Pain Score 2    Pain Location Shoulder   Pain Orientation Right   Pain Descriptors / Indicators Aching                         [facility] Adult PT Treatment/Exercise - 08/09/16 0001      Lumbar Exercises: Aerobic   Stationary Bike 14  minutes at level one      Shoulder Exercises: Seated   Horizontal ABduction Strengthening;Both;5 reps;Theraband   Theraband Level (Shoulder  Horizontal ABduction) Level 1 (Yellow)   Flexion Strengthening;Both;5 reps;Theraband  wide and narrow grip   Theraband Level (Shoulder Flexion) Level 1 (Yellow)   Other Seated Exercises diagonal elevaton with yellow theraband 5 reps with each arm      Shoulder Exercises: Sidelying   Other Sidelying Exercises small circles with hand pointed to ceiling      Manual Therapy   Manual Lymphatic Drainage (MLD) in supine short neck, superficial and deep abdominals, right axillary nodes, anterior interaxillary anastamosis, anterior and posterior neck, then to right sidelying for posterior interaxillary anastamosis. Instructed pt after per handout and included Lt inguiunal nodes and Lt axillo-inguinal anastomosis for when she is working on Lt axilla swelling.    Kinesiotex Edema     Kinesiotix   Edema Skinkote used, then fanshape from left axilla and left back toward low back                        Long Term Clinic Goals - 08/02/16 1721      CC Long Term Goal  #1   Title Patient will report a decrease in left shoulder pain by 75% so she can perform daily activities with greater ease   Status On-going     CC Long Term Goal  #2   Title Reduce circumference measurement at 6 cm superior to sternal notch to 50 cm or less.   Baseline 54.4 at eval, 52 cm at 06/26/2016 . 51 cm on 07/25/2016   Status On-going     CC Long Term Goal  #3   Title Patient will be independent in self manual lymph drainage.   Status Achieved     CC Long Term Goal  #4   Title Patient will increase right shoulder flexion AROM to 140 degrees without any pain for improved ability to perform ADLs.   Status On-going     CC Long Term Goal  #5   Title Patient will utilize chip pack for at least 2 hours per day at home to assess whether compression garment would be beneficial.   Baseline chip packs help, but fluid returns when chip pack is not used ; pt using chip pack, working on being more consistent while  having it stay in place-08/01/16   Status Partially Met     CC Long Term Goal  #6   Title Pt will be independent in [place] program    Status On-going            Plan - 08/09/16 1425    Clinical Impression Statement Pt is making steady gains.  Pt able to increase her minutes on bike today.  contacted Dr. Marlowe Aschoff office to expect prescription for head and neck  garment and to return it as soon as possible.    Rehab Potential Good   Clinical Impairments Affecting Rehab Potential chemotherapy with neuropathy    PT Frequency 2x / week   PT Duration 4 weeks   PT Next Visit Plan Continue with manual lymph drainage; cont kinesiotape per skin integrity, and increase exercise including stationary bike    Consulted and Agree with Plan of Care Patient      Patient will benefit from skilled therapeutic intervention in order to improve the following deficits and impairments:  Decreased range of motion, Impaired UE functional use, Decreased activity tolerance, Decreased knowledge of precautions, Decreased skin integrity, Impaired perceived functional ability, Pain, Decreased knowledge of use of DME, Increased edema, Postural dysfunction, Decreased strength, Impaired sensation, Decreased scar mobility  Visit Diagnosis: Abnormal posture  Lymphedema, not elsewhere classified  Localized swelling, mass and lump, neck  Left shoulder pain, unspecified chronicity  Stiffness of left shoulder joint     Problem List Patient Active Problem List   Diagnosis Date Noted  . Primary malignant neoplasm of left breast with stage 2 nodal metastasis per American Joint Committee on Cancer 7th edition (N2) ([place]) 02/14/2016  . 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 Insomnia 09/01/2015  . Breast cancer of upper-outer quadrant of left female breast ([place]) 08/08/2015   Donato Heinz. Owens Shark PT  Norwood Levo 08/09/2016, 2:28 PM  [place], [place], [address] Phone: [phone]   Fax:  [phone]  Name: Alison [name] MRN: [id number] Date of Birth: 19-Jun-1961

## 2016-08-13 ENCOUNTER — Ambulatory Visit: Payer: 59 | Admitting: Hematology

## 2016-08-13 ENCOUNTER — Other Ambulatory Visit: Payer: 59

## 2016-08-13 ENCOUNTER — Ambulatory Visit: Payer: 59

## 2016-08-13 DIAGNOSIS — R293 Abnormal posture: Secondary | ICD-10-CM

## 2016-08-13 DIAGNOSIS — M79601 Pain in right arm: Secondary | ICD-10-CM

## 2016-08-13 DIAGNOSIS — I890 Lymphedema, not elsewhere classified: Secondary | ICD-10-CM

## 2016-08-13 DIAGNOSIS — M25512 Pain in left shoulder: Secondary | ICD-10-CM

## 2016-08-13 NOTE — Therapy (Signed)
[place], [place], [address] Phone: [phone]   Fax:  [phone]  Physical Therapy Treatment  Patient Details  Name: Alison [name] MRN: [id number] Date of Birth: 1961-05-03 Referring Provider: [name]   Encounter Date: 08/13/2016      PT End of Session - 08/13/16 1102    Visit Number 13   Number of Visits 17   Date for PT Re-Evaluation 08/24/16   PT Start Time 1004   PT Stop Time 1100   PT Time Calculation (min) 56 min   Activity Tolerance Patient tolerated treatment well   Behavior During Therapy [facility] for tasks assessed/performed      Past Medical History:  Diagnosis Date  . Asthma     seasonal asthma  . Breast cancer ([place])   . Complication of anesthesia 2007   aspiration pneumonia post hysterectomy.  Blood pressure would drop low- but no problems 01/2016- surgery  . History of blood transfusion   . History of radiation therapy 04/02/16- 05/21/16   Left Chest Wall, SCV, Axilla, IM nodes  . Hypertension   . Neuropathy ([place])    with chemo  . Osteopenia determined by x-ray   . Pneumonia    1980's and 1990's  . Raynaud's disease   . Rectovaginal fistula    repaired 2014  . Skin cancer 2014   basal- back    Past Surgical History:  Procedure Laterality Date  . ABDOMINAL HYSTERECTOMY  2007   with BSO  . APPENDECTOMY  2007  . AXILLARY LYMPH NODE DISSECTION Left 02/14/2016  . AXILLARY LYMPH NODE DISSECTION Left 02/14/2016   Procedure: LEFT AXILLARY LYMPH NODE DISSECTION;  Surgeon: [name], MD;  Location: [facility];  Service: General;  Laterality: Left;  . BREAST RECONSTRUCTION WITH PLACEMENT OF TISSUE EXPANDER AND FLEX HD (ACELLULAR HYDRATED DERMIS) Left 02/02/2016   Procedure: IMMEDIATE LEFT BREAST RECONSTRUCTION WITH PLACEMENT OF TISSUE EXPANDER AND FLEX HD (ACELLULAR HYDRATED DERMIS);  Surgeon: [name], DO;  Location: [facility];  Service: Plastics;   Laterality: Left;  . BREAST SURGERY Left 02/02/16   mastectomy with reconstruction  . c-section    . COLON RESECTION  4/'2014  . HERNIA REPAIR    . Port- a- cath placement  07/2015  . PORT-A-CATH REMOVAL Right 02/02/2016   Procedure: REMOVAL PORT-A-CATH;  Surgeon: [name], MD;  Location: [facility];  Service: General;  Laterality: Right;  . RADIOACTIVE SEED GUIDED AXILLARY SENTINEL LYMPH NODE Left 02/02/2016   Procedure: LEFT BREAST MASTECTOMY AND RADIOACTIVE SEED GUIDED LEFT SENTINEL LYMPH NODE BIOPSY ;  Surgeon: [name], MD;  Location: [facility];  Service: General;  Laterality: Left;    There were no vitals filed for this visit.      Subjective Assessment - 08/13/16 1010    Subjective Pt reports still not taking the study drug and won't until she has blood drawn again on 08/21/16. Was really fatigued after last session especially Saturday and Sunday and was just completely exhausted. I just want to do the manual lymph drainage today, I have no energy and don't want to be hurting like last time.  My Rt shoulder doesn't hurt today , it's just really tight.    Pertinent History Breast cancer diagnosed November2016 She had chemotherapy. but did have some neuropathy so it had to be stopped. On June 8, she had a mastectomy with immedicate expander placement and with first set of lymphnodes removed,  On June 20 she went for complete axillary lymph node dissection. She says she has had always been "full necked" but has neck and upper body fullness since the cancer was diagnosed. She reports she has osteopenia in low back and has had a fracture that was discovered in 2014 when she had a Dexa scan.   Patient Stated Goals to decrease the edema   Currently in Pain? Yes   Pain Score 7    Pain Location Flank   Pain Orientation Right   Pain Descriptors / Indicators Sharp;Constant   Pain Type Acute pain   Pain Onset 1 to 4 weeks ago   Pain Frequency Constant    Aggravating Factors  moving my trunk   Pain Relieving Factors laying still                         [facility] Adult PT Treatment/Exercise - 08/13/16 0001      Manual Therapy   Manual Lymphatic Drainage (MLD) in supine short neck, superficial and deep abdominals, right axillary nodes, anterior interaxillary anastamosis, anterior and posterior neck and Lt inguinal nodes and Lt axillo-inguinal anastomosis briefly, then to right sidelying for posterior interaxillary anastamosis.    Passive ROM To Rt shoulder gently into flexion, er, and abduction to pts tolerance                        Long Term Clinic Goals - 08/02/16 1721      CC Long Term Goal  #1   Title Patient will report a decrease in left shoulder pain by 75% so she can perform daily activities with greater ease   Status On-going     CC Long Term Goal  #2   Title Reduce circumference measurement at 6 cm superior to sternal notch to 50 cm or less.   Baseline 54.4 at eval, 52 cm at 06/26/2016 . 51 cm on 07/25/2016   Status On-going     CC Long Term Goal  #3   Title Patient will be independent in self manual lymph drainage.   Status Achieved     CC Long Term Goal  #4   Title Patient will increase right shoulder flexion AROM to 140 degrees without any pain for improved ability to perform ADLs.   Status On-going     CC Long Term Goal  #5   Title Patient will utilize chip pack for at least 2 hours per day at home to assess whether compression garment would be beneficial.   Baseline chip packs help, but fluid returns when chip pack is not used ; pt using chip pack, working on being more consistent while having it stay in place-08/01/16   Status Partially Met     CC Long Term Goal  #6   Title Pt will be independent in [place] program    Status On-going            Plan - 08/13/16 1102    Clinical Impression Statement Pt wanted to only have manual therapy today reporting still feeling drained  from low white blood cell count so performed manual lymph drainage and gentle P/ROM to Rt shoulder to pts tolerance ather request. Pt still c/o fairly intense pain at her Rt flank so encouraged her to discuss this with the doctor she sees next week when she gets her blood drawn.    Rehab Potential Good   Clinical Impairments Affecting Rehab Potential chemotherapy with neuropathy  PT Frequency 2x / week   PT Duration 4 weeks   PT Treatment/Interventions ADLs/Self Care Home Management;Manual lymph drainage;Compression bandaging;Scar mobilization;Passive range of motion;Patient/family education;Taping;Manual techniques;Therapeutic exercise;Therapeutic activities;Orthotic Fit/Training;DME Instruction   PT Next Visit Plan Continue with manual lymph drainage; cont kinesiotape per skin integrity, and increase exercise including stationary bike as pt tolerates. See if pt got results from blood work.    Consulted and Agree with Plan of Care Patient      Patient will benefit from skilled therapeutic intervention in order to improve the following deficits and impairments:  Decreased range of motion, Impaired UE functional use, Decreased activity tolerance, Decreased knowledge of precautions, Decreased skin integrity, Impaired perceived functional ability, Pain, Decreased knowledge of use of DME, Increased edema, Postural dysfunction, Decreased strength, Impaired sensation, Decreased scar mobility  Visit Diagnosis: Abnormal posture  Lymphedema, not elsewhere classified  Left shoulder pain, unspecified chronicity  Pain in right arm     Problem List Patient Active Problem List   Diagnosis Date Noted  . Primary malignant neoplasm of left breast with stage 2 nodal metastasis per American Joint Committee on Cancer 7th edition (N2) ([place]) 02/14/2016  . Chemotherapy-induced neuropathy ([place]) 12/30/2015  . Insomnia 09/01/2015  . Breast cancer of upper-outer quadrant of left female breast ([place]) 08/08/2015     Otelia Limes, PTA 08/13/2016, 11:05 AM  [place], [place], [address] Phone: [phone]   Fax:  [phone]  Name: Alison [name] MRN: [id number] Date of Birth: 04/22/1961

## 2016-08-15 ENCOUNTER — Telehealth: Payer: Self-pay | Admitting: *Deleted

## 2016-08-15 NOTE — Telephone Encounter (Signed)
Pt came in to office to pick up Oxycodone and Oxycontin scripts. Reports she spoke with a nurse on Monday and was told to come in today. Last filled on 11/27, pt is scheduled to see Dr. Burr Medico on 12/26 ([place] out of office). MD is seeing pts now, and pt has enough pain medicine to last until next visit. Informed pt we will refill narcotics day of office visit. She voiced understanding, just wanted to fill early because she is going out of town after MD visit 12/26.

## 2016-08-16 ENCOUNTER — Other Ambulatory Visit: Payer: Self-pay | Admitting: *Deleted

## 2016-08-16 ENCOUNTER — Encounter: Payer: 59 | Admitting: Physical Therapy

## 2016-08-16 DIAGNOSIS — C50412 Malignant neoplasm of upper-outer quadrant of left female breast: Secondary | ICD-10-CM

## 2016-08-16 MED ORDER — OXYCODONE HCL ER 20 MG PO T12A
40.0000 mg | EXTENDED_RELEASE_TABLET | Freq: Two times a day (BID) | ORAL | 0 refills | Status: DC
Start: 2016-08-16 — End: 2016-09-11

## 2016-08-16 MED ORDER — OXYCODONE HCL 5 MG PO TABS: 5.0000 mg | ORAL_TABLET | ORAL | 0 refills | Status: DC | PRN

## 2016-08-16 NOTE — Telephone Encounter (Signed)
This RN contacted pt per request for refills and apologized d/t not having scripts ready for refill per discussion on 12/19.  Alison [name] would like to obtain due to planing on leaving ASAP post visit on 12/26.  Prescriptions will be obtained and made available for pick up

## 2016-08-18 ENCOUNTER — Encounter: Payer: Self-pay | Admitting: Oncology

## 2016-08-21 ENCOUNTER — Other Ambulatory Visit (HOSPITAL_BASED_OUTPATIENT_CLINIC_OR_DEPARTMENT_OTHER): Payer: 59

## 2016-08-21 ENCOUNTER — Encounter: Payer: Self-pay | Admitting: Hematology

## 2016-08-21 ENCOUNTER — Ambulatory Visit (HOSPITAL_BASED_OUTPATIENT_CLINIC_OR_DEPARTMENT_OTHER): Payer: 59 | Admitting: Hematology

## 2016-08-21 ENCOUNTER — Other Ambulatory Visit: Payer: Self-pay | Admitting: *Deleted

## 2016-08-21 ENCOUNTER — Other Ambulatory Visit: Payer: 59

## 2016-08-21 ENCOUNTER — Ambulatory Visit: Payer: 59 | Admitting: Hematology

## 2016-08-21 ENCOUNTER — Encounter: Payer: 59 | Admitting: *Deleted

## 2016-08-21 VITALS — BP 106/53 | HR 88 | Temp 98.6°F | Resp 18 | Ht 62.25 in | Wt 158.6 lb

## 2016-08-21 DIAGNOSIS — C50412 Malignant neoplasm of upper-outer quadrant of left female breast: Secondary | ICD-10-CM | POA: Diagnosis not present

## 2016-08-21 DIAGNOSIS — G620 Drug-induced polyneuropathy: Secondary | ICD-10-CM

## 2016-08-21 DIAGNOSIS — Z170 Estrogen receptor positive status [ER+]: Principal | ICD-10-CM

## 2016-08-21 DIAGNOSIS — G4700 Insomnia, unspecified: Secondary | ICD-10-CM

## 2016-08-21 DIAGNOSIS — Z79811 Long term (current) use of aromatase inhibitors: Secondary | ICD-10-CM

## 2016-08-21 DIAGNOSIS — T451X5A Adverse effect of antineoplastic and immunosuppressive drugs, initial encounter: Principal | ICD-10-CM

## 2016-08-21 DIAGNOSIS — I890 Lymphedema, not elsewhere classified: Secondary | ICD-10-CM

## 2016-08-21 DIAGNOSIS — M2550 Pain in unspecified joint: Secondary | ICD-10-CM

## 2016-08-21 DIAGNOSIS — I9789 Other postprocedural complications and disorders of the circulatory system, not elsewhere classified: Secondary | ICD-10-CM

## 2016-08-21 DIAGNOSIS — Z006 Encounter for examination for normal comparison and control in clinical research program: Secondary | ICD-10-CM

## 2016-08-21 LAB — CBC WITH DIFFERENTIAL/PLATELET
BASO%: 3.6 % — ABNORMAL HIGH (ref 0.0–2.0)
BASOS ABS: 0.1 10*3/uL (ref 0.0–0.1)
EOS ABS: 0.1 10*3/uL (ref 0.0–0.5)
EOS%: 4.3 % (ref 0.0–7.0)
HCT: 32.8 % — ABNORMAL LOW (ref 34.8–46.6)
HGB: 11.1 g/dL — ABNORMAL LOW (ref 11.6–15.9)
LYMPH%: 27.8 % (ref 14.0–49.7)
MCH: 30.4 pg (ref 25.1–34.0)
MCHC: 33.8 g/dL (ref 31.5–36.0)
MCV: 89.9 fL (ref 79.5–101.0)
MONO#: 0.3 10*3/uL (ref 0.1–0.9)
MONO%: 11.6 % (ref 0.0–14.0)
NEUT#: 1.5 10*3/uL (ref 1.5–6.5)
NEUT%: 52.7 % (ref 38.4–76.8)
Platelets: 319 10*3/uL (ref 145–400)
RBC: 3.65 10*6/uL — AB (ref 3.70–5.45)
RDW: 14.4 % (ref 11.2–14.5)
WBC: 2.8 10*3/uL — ABNORMAL LOW (ref 3.9–10.3)
lymph#: 0.8 10*3/uL — ABNORMAL LOW (ref 0.9–3.3)

## 2016-08-21 LAB — COMPREHENSIVE METABOLIC PANEL
ALT: 19 U/L (ref 0–55)
AST: 21 U/L (ref 5–34)
Albumin: 3.7 g/dL (ref 3.5–5.0)
Alkaline Phosphatase: 64 U/L (ref 40–150)
Anion Gap: 8 mEq/L (ref 3–11)
BUN: 21.1 mg/dL (ref 7.0–26.0)
CO2: 24 meq/L (ref 22–29)
Calcium: 10 mg/dL (ref 8.4–10.4)
Chloride: 106 mEq/L (ref 98–109)
Creatinine: 0.9 mg/dL (ref 0.6–1.1)
EGFR: 69 mL/min/{1.73_m2} — AB (ref >90)
GLUCOSE: 90 mg/dL (ref 70–140)
POTASSIUM: 4.5 meq/L (ref 3.5–5.1)
SODIUM: 139 meq/L (ref 136–145)
Total Bilirubin: 0.22 mg/dL (ref 0.20–1.20)
Total Protein: 6.7 g/dL (ref 6.4–8.3)

## 2016-08-21 MED ORDER — INV-PALBOCICLIB 125 MG CAPS #23 ALLIANCE FOUNDATION AFT-05 (PALLAS): 125.0000 mg | ORAL_CAPSULE | Freq: Every day | ORAL | 0 refills | Status: DC

## 2016-08-21 MED ORDER — PROCHLORPERAZINE MALEATE 10 MG PO TABS: 10.0000 mg | ORAL_TABLET | Freq: Four times a day (QID) | ORAL | 2 refills | Status: DC | PRN

## 2016-08-21 NOTE — Progress Notes (Signed)
[facility]  [phone] [phone]  Clinic Follow up Note   Patient Care Team: [name], MD as PCP - General (Internal Medicine) Erroll Luna, MD as Consulting Physician (General Surgery) Chauncey Cruel, MD as Consulting Physician (Oncology) Aloha Gell, MD as Consulting Physician (Obstetrics and Gynecology) Harriett Sine, MD as Consulting Physician (Dermatology) Benson Norway, RN as Registered Nurse (Oncology) Eppie Gibson, MD as Attending Physician (Radiation Oncology) 08/21/2016  CHIEF COMPLAINT: Estrogen receptor positive breast cancer  CURRENT TREATMENT: Anastrozole started Oct 2017  PRIOR TREATMENT:  (1) neoadjuvant chemotherapy started 08/25/2015 consisting of doxorubicin and cyclophosphamide in dose dense fashion 4, completed 10/06/2015,  followed by weekly paclitaxel 10 completed 12/23/2015             (a) final 2 planned cycles of weekly paclitaxel omitted because of neuropathy concerns  (2) status post left mastectomy with targeted axillary dissection 02/02/2016 for a ypT2 ypN2a, stage IIIa invasive ductal carcinoma, grade 2, with negative margins, repeat prognostic panel showing estrogen receptor positive, but progesterone receptor and HER-2 negative             (a) completion axillary lymph node dissection 02/14/2016 showed an additional 2 out of 8 lymph nodes to be involved (final count 6 out of 10 lymph nodes positive             (b) expander placement at the time of left mastectomy  (3) adjuvant capecitabine starting concurrently with radiation--discontinued 05/21/2016  (4) adjuvant  radiation started 04/02/2016, completed 05/21/2016   BREAST CANCER HISTORY: From the original intake note:  Ivalee had routine screening mammography with tomography [facility] February 20 11/14/2014 showing a possible mass in the right breast. Diagnostic right mammography with right breast ultrasonography  11/01/2014 found the breast density to be category C. In the upper outer quadrant of the right breast there was a partially obscured mass which on physical exam was palpable as an area of thickening at the 10:00 position 8 cm from the nipple. Ultrasound showed multiple cysts in the upper outer right breast corresponding to the mass in question.  In November 2016 the patient felt a change in the upper left breast and brought it to her primary care physician's attention. Diagnostic left mammography with tomosynthesis and left breast ultrasonography at the [facility] December 01/14/2015 found trabecular thickening throughout the left breast with skin thickening, but no circumscribed mass or suspicious calcifications. A rounded mass in the left axilla measured 6 mm. There was an additional mildly prominent lymph node in the left axilla which was what the patient had been palpating. Ultrasonography showed ill-defined swelling throughout the inner left breast with overlying skin thickening.. At the 2:00 position 4 cm from the nipple there was an irregular hypoechoic mass measuring 2.3 cm. A second mass at the 1:00 area 3 cm from the nipple measured 1 cm. The distance between these 2 masses was 1.8 cm. There was also a round hypoechoic left axillary mass measuring 0.6 cm. Overall the was an area of 3.7 cm of mixed echogenicity corresponding to the area of palpable soft tissue swelling.   On 08/04/2015 the patient underwent biopsy of the mass at the 2:00 axis 4 cm from the nipple and a second biopsy was performed of the hypoechoic mass at the 1:00 position 3 cm from the nipple. Biopsy of the suspicious nodule in the lower left axilla was attempted but could not be completed as the mass became obscured after administration  of lidocaine.  The pathology from both left breast biopsies 08/04/2015 ([id number]) showed invasive ductal carcinoma, grade 3. The prognostic profile obtained from the larger tumor showed  estrogen receptor to be strongly positive at 95%, progesterone receptor positive at 30%, with an MIB-1 of 12%, and no HER-2 amplification, the signals ratio being 1.22 and the number per cell 2.20.  On 08/09/2015 the patient underwent biopsy of this suspicious left axillary lymph node noted above, and this showed ([id number]) metastatic carcinoma with extracapsular extension.  The patient's subsequent history is as detailed below  INTERVAL HISTORY: Mrs. Cloninger returns for follow-up. I'm covering my partner Dr. [name] to see her in the clinic today. She is doing well overall. She still has significant neck and upper chest lymphedema. She has been doing physical therapy. She also has diffuse arthralgia and stiffness in her joints, has some difficulty to get out of bed sometime. She is on oxycodone and OxyContin, which controls her pain well overall. She otherwise is tolerating letrozole and Ibrance well.  REVIEW OF SYSTEMS:   Constitutional: Denies fevers, chills or abnormal weight loss Eyes: Denies blurriness of vision Ears, nose, mouth, throat, and face: Denies mucositis or sore throat Respiratory: Denies cough, dyspnea or wheezes Cardiovascular: Denies palpitation, chest discomfort or lower extremity swelling Gastrointestinal:  Denies nausea, heartburn or change in bowel habits Skin: Denies abnormal skin rashes Lymphatics: Denies new lymphadenopathy or easy bruising Musculoskeletal: Diffuse joint stiffness and arthralgia Neurological:Denies numbness, tingling or new weaknesses Behavioral/Psych: Mood is stable, no new changes  All other systems were reviewed with the patient and are negative.  MEDICAL HISTORY:  Past Medical History:  Diagnosis Date  . Asthma     seasonal asthma  . Breast cancer ([place])   . Complication of anesthesia 2007   aspiration pneumonia post hysterectomy.  Blood pressure would drop low- but no problems 01/2016- surgery  . History of blood transfusion   .  History of radiation therapy 04/02/16- 05/21/16   Left Chest Wall, SCV, Axilla, IM nodes  . Hypertension   . Neuropathy ([place])    with chemo  . Osteopenia determined by x-ray   . Pneumonia    1980's and 1990's  . Raynaud's disease   . Rectovaginal fistula    repaired 2014  . Skin cancer 2014   basal- back    SURGICAL HISTORY: Past Surgical History:  Procedure Laterality Date  . ABDOMINAL HYSTERECTOMY  2007   with BSO  . APPENDECTOMY  2007  . AXILLARY LYMPH NODE DISSECTION Left 02/14/2016  . AXILLARY LYMPH NODE DISSECTION Left 02/14/2016   Procedure: LEFT AXILLARY LYMPH NODE DISSECTION;  Surgeon: [name], MD;  Location: [facility];  Service: General;  Laterality: Left;  . BREAST RECONSTRUCTION WITH PLACEMENT OF TISSUE EXPANDER AND FLEX HD (ACELLULAR HYDRATED DERMIS) Left 02/02/2016   Procedure: IMMEDIATE LEFT BREAST RECONSTRUCTION WITH PLACEMENT OF TISSUE EXPANDER AND FLEX HD (ACELLULAR HYDRATED DERMIS);  Surgeon: [name], DO;  Location: [facility];  Service: Plastics;  Laterality: Left;  . BREAST SURGERY Left 02/02/16   mastectomy with reconstruction  . c-section    . COLON RESECTION  4/'2014  . HERNIA REPAIR    . Port- a- cath placement  07/2015  . PORT-A-CATH REMOVAL Right 02/02/2016   Procedure: REMOVAL PORT-A-CATH;  Surgeon: [name], MD;  Location: [facility];  Service: General;  Laterality: Right;  . RADIOACTIVE SEED GUIDED AXILLARY SENTINEL LYMPH NODE Left 02/02/2016   Procedure: LEFT BREAST MASTECTOMY  AND RADIOACTIVE SEED GUIDED LEFT SENTINEL LYMPH NODE BIOPSY ;  Surgeon: [name], MD;  Location: [facility];  Service: General;  Laterality: Left;    I have reviewed the social history and family history with the patient and they are unchanged from previous note.  ALLERGIES:  is allergic to sulfa antibiotics and zofran [ondansetron hcl].  MEDICATIONS:  Current Outpatient Prescriptions  Medication Sig Dispense Refill    . anastrozole (ARIMIDEX) 1 MG tablet Take 1 tablet (1 mg total) by mouth daily. 90 tablet 4  . cetirizine (ZYRTEC) 10 MG tablet Take 10 mg by mouth daily.    . Cholecalciferol (VITAMIN D3) 2000 UNITS capsule Take 2,000 Units by mouth daily.     . Fenofibric Acid 105 MG TABS Take 1 tablet by mouth daily.    . fluticasone (FLONASE) 50 MCG/ACT nasal spray Place 2 sprays into both nostrils daily. Reported on 11/18/2015    . gabapentin (NEURONTIN) 300 MG capsule Take 1 capsule (300 mg total) by mouth 3 (three) times daily. 270 capsule 0  . ibuprofen (ADVIL,MOTRIN) 200 MG tablet Take 400 mg by mouth every 6 (six) hours as needed for headache, mild pain or moderate pain. Reported on 01/06/2016    . Investigational aspirin/placebo 300 MG tablet Alliance [id number] Take 1 tablet by mouth daily. Take with food or a full glass of water.  Do not crush enteric coated tablets. 180 tablet 0  . levocetirizine (XYZAL) 5 MG tablet Take 5 mg by mouth every evening.    Marland Kitchen LORazepam (ATIVAN) 0.5 MG tablet Take 0.5 mg by mouth at bedtime.    Marland Kitchen losartan (COZAAR) 50 MG tablet Take 1 tablet (50 mg total) by mouth daily. 90 tablet 4  . Multiple Vitamin (MULTIVITAMIN) tablet Take 1 tablet by mouth daily.    Marland Kitchen olmesartan (BENICAR) 20 MG tablet Take 1 tablet (20 mg total) by mouth daily. 90 tablet 4  . omega-3 acid ethyl esters (LOVAZA) 1 g capsule Take 1 g by mouth 2 (two) times daily.    Marland Kitchen oxyCODONE (OXY IR/ROXICODONE) 5 MG immediate release tablet Take 1-2 tablets (5-10 mg total) by mouth every 4 (four) hours as needed for moderate pain, severe pain or breakthrough pain. 120 tablet 0  . oxyCODONE (OXYCONTIN) 20 mg 12 hr tablet Take 2 tablets (40 mg total) by mouth every 12 (twelve) hours. 120 tablet 0  . polyethylene glycol (MIRALAX / GLYCOLAX) packet Take 17 g by mouth daily as needed for mild constipation. Reported on 01/06/2016    . Probiotic Product (PROBIOTIC DAILY PO) Take 1 capsule by mouth daily. [place] traMADol (ULTRAM) 50 MG tablet Take 1-2 tablets (50-100 mg total) by mouth every 6 (six) hours as needed for moderate pain. 60 tablet 3  . traZODone (DESYREL) 50 MG tablet Take 3 tablets (150 mg total) by mouth at bedtime. 90 tablet 1  . zolpidem (AMBIEN) 5 MG tablet Take 1 tablet (5 mg total) by mouth at bedtime as needed for sleep. 30 tablet 2  . Investigational palbociclib (IBRANCE) 125 MG capsule [employer] AFT-05 PALLAS Take 1 capsule (125 mg total) by mouth daily. Take with food. Swallow whole. Do not chew. Take on days 1-21. Repeat every 28 days. 23 capsule 0  . prochlorperazine (COMPAZINE) 10 MG tablet Take 1 tablet (10 mg total) by mouth every 6 (six) hours as needed (Nausea or vomiting). 30 tablet 2   No current  facility-administered medications for this visit.     PHYSICAL EXAMINATION: ECOG PERFORMANCE STATUS: 1 - Symptomatic but completely ambulatory  Vitals:   08/21/16 1150  BP: (!) 106/53  Pulse: 88  Resp: 18  Temp: 98.6 F (37 C)   Filed Weights   08/21/16 1150  Weight: 158 lb 9.6 oz (71.9 kg)    GENERAL:alert, no distress and comfortable SKIN: skin color, texture, turgor are normal, no rashes or significant lesions EYES: normal, Conjunctiva are pink and non-injected, sclera clear OROPHARYNX:no exudate, no erythema and lips, buccal mucosa, and tongue normal  NECK: supple, thyroid normal size, non-tender, without nodularity LYMPH:  no palpable lymphadenopathy in the cervical, axillary or inguinal LUNGS: clear to auscultation and percussion with normal breathing effort HEART: regular rate & rhythm and no murmurs and no lower extremity edema ABDOMEN:abdomen soft, non-tender and normal bowel sounds Musculoskeletal:no cyanosis of digits and no clubbing  NEURO: alert & oriented x 3 with fluent speech, no focal motor/sensory deficits  LABORATORY DATA:  I have reviewed the data as listed CBC Latest Ref Rng & Units 08/21/2016  08/08/2016 07/23/2016  WBC 3.9 - 10.3 10e3/uL 2.8(L) 1.8(L) 3.9  Hemoglobin 11.6 - 15.9 g/dL 11.1(L) 11.7 12.1  Hematocrit 34.8 - 46.6 % 32.8(L) 35.5 36.6  Platelets 145 - 400 10e3/uL 319 260 374     CMP Latest Ref Rng & Units 08/21/2016 08/08/2016 07/23/2016  Glucose 70 - 140 mg/dl 90 112 134  BUN 7.0 - 26.0 mg/dL 21.1 22.1 14.7  Creatinine 0.6 - 1.1 mg/dL 0.9 1.1 1.2(H)  Sodium 136 - 145 mEq/L 139 141 139  Potassium 3.5 - 5.1 mEq/L 4.5 4.5 4.6  Chloride 101 - 111 mmol/L - - -  CO2 22 - 29 mEq/L 24 23 21(L)  Calcium 8.4 - 10.4 mg/dL 10.0 9.9 9.9  Total Protein 6.4 - 8.3 g/dL 6.7 7.3 6.8  Total Bilirubin 0.20 - 1.20 mg/dL 0.22 0.26 0.23  Alkaline Phos 40 - 150 U/L 64 57 61  AST 5 - 34 U/L _0 ALT 0 - 55 U/L _1 RADIOGRAPHIC STUDIES: I have personally reviewed the radiological images as listed and agreed with the findings in the report. No results found.   ASSESSMENT & PLAN: 55 y.o. postmenopausal woman  1. 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 She developed grade 3 neutropenia on C1D14 and Ibrance was held after day 14. -Lab reviewed, her [place] is normal today, she still has mild leukopenia, mild anemia. Her CMP is normal. We'll proceed cycle 2 Ibrance today -Continue letrozole. She does have moderate arthralgia from prior chemotherapy Taxol and letrozole, I encouraged her to continue exercise. She feels her OxyContin and oxycodone does not help her arthralgia, I encouraged her to use Tylenol and ibuprofen as needed (no more than 5 times a week)   2. Osteopenia  -Continue calcium and vitamin D supplement -Repeat bone density scan every 2  years  3. Left chest and shoulder pain  -Second to left mastectomy and axillary node dissection -She is on OxyContin and oxycodone, her pain is overall controlled -She was referred to pain clinic, has not had appointment yet.  4. Obesity  -on the BWEL study for diet,  exercise and weight loss   5. Neck and left upper chest lymphedema -She will continue physical therapy at the lymphedema clinic  6. Arthralgia -Secondary to prior chemotherapy and letrozole -I encouraged her to continue exercise and use Tylenol or ibuprofen as needed  Plan -lab reviewed, will start C2 ibrance on 08/23/17 -she has had G3 neutropenia and G1 anemia from [place] -she has arthralgia from letrozole -She will return in 2 weeks for lab, and we'll make a decision if she can continue Ibrance based on her [place] -She will return in 4 weeks for lab, follow-up with Dr. [name] and Serita Grammes    All questions were answered. The patient knows to call the clinic with any problems, questions or concerns. No barriers to learning was detected.  I spent 25 minutes counseling the patient face to face. The total time spent in the appointment was 30 minutes and more than 50% was on counseling and review of test results     Truitt Merle, MD 08/21/16

## 2016-08-21 NOTE — Progress Notes (Signed)
08/21/2016 Patient in to clinic today accompanied by her husband, for AFT-05 PALLAS Cycle 2 visit. Of note, treatment is due to begin on Thursday 08/23/16, however, due to patient's travel out of town today, assessments are being performed in the 2-day window allowed per protocol. Upon arrival to the clinic, PRO questionnaires were completed independently by the patient. The patient then proceeded to the lab for collection of routine labs. Completed Cycle 1 diaries and palbociclib pill bottle were returned by patient today; pill bottle returned to pharmacy for drug accountability by pharmacist, Carolynne Edouard, with 8 capsules remaining due to treatment hold following Day 14 labs.  Reviewed study medication and anti-hormone therapy instructions with patient. Anti-hormone therapy drug diary for Cycle 1 was reviewed, then returned to patient for completion with tomorrow's dose. Cycle 2 anti-hormone therapy drug diary was given to patient to begin recording doses, without interruption, on Thursday 08/23/2016. Patient confirms that Cycle 1 palbociclib was taken as instructed, with last dose taken on 08/07/2016 prior to treatment hold on Day 14. Completed diary was retained at the site, and a new bottle of investigational palbociclib, kit [id number] with 21 capsules, was dispensed to patient today with instructions to begin dosing on Thursday 08/23/2016. Patient was given a palbociclib drug diary to record her doses of study medication.  Patient is seen by Dr. Burr Medico today, in Dr. Virgie Dad absence. Based on lab results review and history and physical by Dr. Burr Medico, patient condition is acceptable for continued treatment. As patient's labs have improved from the C1D14 assessment, and are within parameters for retreatment, investigational palbociclib will be restarted today at initial starting dose of 125 mg days 1-21. Confirmed patient's schedule for lab appointment on Cycle 2, Day 14 (09/05/2016) and follow-up with Dr.  [name] and additional labs on Thursday, January 25th. Cindy S. Brigitte Pulse BSN, RN, [place] 08/21/2016 2:32 PM   Adverse Event Log Study/Protocol: AFT-05 PALLAS Cycle 1: 07/26/2016 - 08/23/2016 Event Grade Onset Date Resolved Date Attribution to palbociclib Attribution to anastrozole Comments  WBC decreased 3 08/08/16 Ongoing Yes No   Neutrophils decreased 3 08/08/16 08/21/16 Yes No   Lymphocyte count decreased 2 08/08/16 Ongoing Yes No   Cindy S. Brigitte Pulse BSN, RN, [place] 09/16/2016 2:04 PM

## 2016-08-22 ENCOUNTER — Encounter: Payer: Self-pay | Admitting: *Deleted

## 2016-08-24 ENCOUNTER — Encounter: Payer: 59 | Admitting: Adult Health

## 2016-08-24 ENCOUNTER — Other Ambulatory Visit: Payer: 59

## 2016-08-24 ENCOUNTER — Ambulatory Visit: Payer: 59 | Admitting: Oncology

## 2016-08-28 ENCOUNTER — Ambulatory Visit: Payer: 59 | Admitting: Physical Therapy

## 2016-08-30 ENCOUNTER — Ambulatory Visit: Payer: 59 | Attending: General Surgery | Admitting: Physical Therapy

## 2016-08-30 DIAGNOSIS — R293 Abnormal posture: Secondary | ICD-10-CM | POA: Diagnosis present

## 2016-08-30 DIAGNOSIS — M25612 Stiffness of left shoulder, not elsewhere classified: Secondary | ICD-10-CM | POA: Insufficient documentation

## 2016-08-30 DIAGNOSIS — M25512 Pain in left shoulder: Secondary | ICD-10-CM | POA: Diagnosis present

## 2016-08-30 DIAGNOSIS — I890 Lymphedema, not elsewhere classified: Secondary | ICD-10-CM | POA: Diagnosis present

## 2016-08-30 DIAGNOSIS — M79601 Pain in right arm: Secondary | ICD-10-CM | POA: Diagnosis present

## 2016-08-30 NOTE — Therapy (Signed)
[place], [place], [address] Phone: [phone]   Fax:  [phone]  Physical Therapy Treatment  Patient Details  Name: Alison [name] MRN: [id number] Date of Birth: [birth year] Referring Provider: [name]   Encounter Date: 08/30/2016      PT End of Session - 08/30/16 1800    Date for PT Re-Evaluation 10/11/16      Past Medical History:  Diagnosis Date  . Asthma     seasonal asthma  . Breast cancer ([place])   . Complication of anesthesia 2007   aspiration pneumonia post hysterectomy.  Blood pressure would drop low- but no problems 01/2016- surgery  . History of blood transfusion   . History of mitral valve prolapse 1988   with palpitations  . History of radiation therapy 04/02/16- 05/21/16   Left Chest Wall, SCV, Axilla, IM nodes  . Hypertension   . Neuropathy ([place])    with chemo  . Osteopenia determined by x-ray   . Pneumonia    1980's and 1990's  . Raynaud's disease   . 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 Skin cancer 2014   basal- back    Past Surgical History:  Procedure Laterality Date  . ABDOMINAL HYSTERECTOMY  2007   with BSO  . APPENDECTOMY  2007  . AXILLARY LYMPH NODE DISSECTION Left 02/14/2016   Procedure: LEFT AXILLARY LYMPH NODE DISSECTION;  Surgeon: [name], MD;  Location: [facility];  Service: General;  Laterality: Left;  . BREAST RECONSTRUCTION WITH PLACEMENT OF TISSUE EXPANDER AND FLEX HD (ACELLULAR HYDRATED DERMIS) Left 02/02/2016   Procedure: IMMEDIATE LEFT BREAST RECONSTRUCTION WITH PLACEMENT OF TISSUE EXPANDER AND FLEX HD (ACELLULAR HYDRATED DERMIS);  Surgeon: [name], DO;  Location: [facility];  Service: Plastics;  Laterality: Left;  . BREAST SURGERY Left 02/02/16   mastectomy with reconstruction  . c-section  1991  . COLON RESECTION SIGMOID  12/02/2012   [facility]   . HERNIA REPAIR  2007  . LAPAROSCOPIC  ENDOMETRIOSIS FULGURATION  1984, 1989  . Port- a- cath placement  07/2015  . PORT-A-CATH REMOVAL Right 02/02/2016   Procedure: REMOVAL PORT-A-CATH;  Surgeon: [name], MD;  Location: [facility];  Service: General;  Laterality: Right;  . RADIOACTIVE SEED GUIDED AXILLARY SENTINEL LYMPH NODE Left 02/02/2016   Procedure: LEFT BREAST MASTECTOMY AND RADIOACTIVE SEED GUIDED LEFT SENTINEL LYMPH NODE BIOPSY ;  Surgeon: [name], MD;  Location: [facility];  Service: General;  Laterality: Left;    There were no vitals filed for this visit.      Subjective Assessment - 08/30/16 1315    Subjective Pt reports she is still having pain in right chest below breast and right axilla and also in left axilla.  She overall is not doing well since she has started the new medicines and feel that her chest, neck and back are full She is getting her Flexitouch instruction on Saturday and has not heard about her compression garment    Pertinent History Breast cancer diagnosed November2016 She had chemotherapy. but did have some neuropathy so it had to be stopped. On June 8, she had a mastectomy with immedicate expander placement and with first set of lymphnodes removed, On June 20 she went for complete axillary lymph node dissection. She says she has had always been "full necked" but has neck and upper body fullness since the cancer was diagnosed. She reports she has  osteopenia in low back and has had a fracture that was discovered in 2014 when she had a Dexa scan.   Patient Stated Goals to decrease the edema   Currently in Pain? Yes   Pain Score 3   with medication    Pain Location Axilla   Pain Orientation Left   Pain Descriptors / Indicators Discomfort   Pain Type Chronic pain   Pain Radiating Towards arm neck shoulder elbow and wrist    Pain Onset More than a month ago   Pain Frequency Constant   Multiple Pain Sites Yes   Pain Score 7   Pain Location Rib cage   Pain  Orientation Right   Pain Descriptors / Indicators Sharp   Pain Type Chronic pain   Pain Onset 1 to 4 weeks ago   Pain Frequency Constant   Aggravating Factors  movement    Pain Relieving Factors lying still             [facility] PT Assessment - 08/30/16 0001      Assessment   Medical Diagnosis breast cancer   Referring Provider [name]    Onset Date/Surgical Date 07/03/15   Hand Dominance Right           LYMPHEDEMA/ONCOLOGY QUESTIONNAIRE - 08/30/16 1345      Head and Neck   4 cm superior to sternal notch around neck 53.5 cm   6 cm superior to sternal notch around neck 53.5 cm   8 cm superior to sternal notch around neck 52.5 cm                  [facility] Adult PT Treatment/Exercise - 08/30/16 0001      Shoulder Exercises: Seated   Elevation AROM;Both;5 reps  shoulder width and wide grip    Horizontal ABduction AROM;Both;5 reps   Other Seated Exercises diagonal elevatoin with each arm AROM      Manual Therapy   Manual Lymphatic Drainage (MLD) in left sidelying, pumps and stationary circles to right lateral trunk and  lower chest at painful area , and back                         [place] - 08/30/16 1801      CC Long Term Goal  #1   Title Patient will report a decrease in left shoulder pain by 75% so she can perform daily activities with greater ease   Time 6   Status On-going     CC Long Term Goal  #2   Title Reduce circumference measurement at 6 cm superior to sternal notch to 50 cm or less.   Baseline 54.4 at eval, 52 cm at 06/26/2016 . 51 cm on 07/25/2016, 52.5 at 08/30/2016   Time 6   Period Weeks   Status On-going     CC Long Term Goal  #3   Title Patient will be independent in self manual lymph drainage.   Status Achieved     CC Long Term Goal  #4   Title Patient will increase right shoulder flexion AROM to 140 degrees without any pain for improved ability to perform ADLs.   Baseline 145 on 10/31, 130 degrees  07/25/2016  limited by pain    Time 6   Period Weeks   Status On-going     CC Long Term Goal  #5   Title Patient will utilize chip pack for at least 2 hours per  day at home to assess whether compression garment would be beneficial.   Baseline chip packs help, but fluid returns when chip pack is not used ; pt using chip pack, working on being more consistent while having it stay in place-08/01/16  pt states she is using chip pack    Status Achieved     CC Long Term Goal  #6   Title Pt will be independent in strenthening program    Baseline pt with too much pain with resistive exercise    Status Deferred            Plan - 08/30/16 1757    Clinical Impression Statement Ms. Tye is not progressing due to diffuse and various pain in both arms and persistent edema in neck, trunk and back and left axilla.  She is to have a Flexitouch for head and neck training this saturday and a neck compression garment has been ordered.  Will decrease to once per week and then to once every 2-4 weeks to make sure she is able to use thes devices to control swelling.  Encouraged her to go to [place] at cancer center and walk and to active range of motion with arms as she is not able to tolerate resiistive exericse    Rehab Potential Fair   Clinical Impairments Affecting Rehab Potential chemotherapy with neuropathy    PT Frequency 1x / week  decreasing frequency   PT Duration 6 weeks   PT Treatment/Interventions ADLs/Self Care Home Management;Manual lymph drainage;Compression bandaging;Scar mobilization;Passive range of motion;Patient/family education;Taping;Manual techniques;Therapeutic exercise;Therapeutic activities;Orthotic Fit/Training;DME Instruction   PT Next Visit Plan Remeasure neck circumference update HEP    Consulted and Agree with Plan of Care Patient      Patient will benefit from skilled therapeutic intervention in order to improve the following deficits and impairments:  Decreased  range of motion, Impaired UE functional use, Decreased activity tolerance, Decreased knowledge of precautions, Decreased skin integrity, Impaired perceived functional ability, Pain, Decreased knowledge of use of DME, Increased edema, Postural dysfunction, Decreased strength, Impaired sensation, Decreased scar mobility  Visit Diagnosis: Abnormal posture - Plan: PT plan of care cert/re-cert  Lymphedema, not elsewhere classified - Plan: PT plan of care cert/re-cert  Left shoulder pain, unspecified chronicity - Plan: PT plan of care cert/re-cert  Pain in right arm - Plan: PT plan of care cert/re-cert  Stiffness of left shoulder joint - Plan: PT plan of care cert/re-cert     Problem List Patient Active Problem List   Diagnosis Date Noted  . Primary malignant neoplasm of left breast with stage 2 nodal metastasis per American Joint Committee on Cancer 7th edition (N2) ([place]) 02/14/2016  . 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 Insomnia 09/01/2015  . Breast cancer of upper-outer quadrant of left female breast ([place]) 08/08/2015   Donato Heinz. Owens Shark PT  [place]wood Levo 08/30/2016, 6:06 PM  [place], [place], [address] Phone: [phone]   Fax:  [phone]  Name: MONIQUE HEFTY MRN: [id number] Date of Birth: 10/14/1960

## 2016-08-31 ENCOUNTER — Ambulatory Visit (HOSPITAL_BASED_OUTPATIENT_CLINIC_OR_DEPARTMENT_OTHER): Payer: 59 | Admitting: Adult Health

## 2016-08-31 VITALS — BP 105/59 | HR 98 | Temp 98.4°F | Resp 18 | Wt 162.0 lb

## 2016-08-31 DIAGNOSIS — Z780 Asymptomatic menopausal state: Secondary | ICD-10-CM | POA: Diagnosis not present

## 2016-08-31 DIAGNOSIS — C50412 Malignant neoplasm of upper-outer quadrant of left female breast: Secondary | ICD-10-CM | POA: Diagnosis not present

## 2016-08-31 DIAGNOSIS — Z170 Estrogen receptor positive status [ER+]: Secondary | ICD-10-CM | POA: Diagnosis not present

## 2016-08-31 DIAGNOSIS — Z79811 Long term (current) use of aromatase inhibitors: Secondary | ICD-10-CM

## 2016-08-31 NOTE — Progress Notes (Signed)
CLINIC:  Survivorship   REASON FOR VISIT:  Routine follow-up post-treatment for a recent history of breast cancer.  BRIEF ONCOLOGIC HISTORY:    Breast cancer of upper-outer quadrant of left female breast ([place])   08/04/2015 Initial Biopsy    (L) breast biopsy (2:00): IDC, grade 3, high grade DCIS with calcs and necrosis.  (L) breast biopsy (1:00): IDC, grade 3, high grade DCIS with calcs and necrosis. ER+ (95%), PR+ (30%), Ki67 12%, HER2 neg (ratio 1.22).       08/08/2015 Initial Diagnosis    Breast cancer of upper-outer quadrant of left female breast ([place])     08/09/2015 Procedure    (L) axilla needle biopsy: Metastatic carcinoma with ECE.       08/12/2015 Imaging    Bone scan: No definite evidence of osseous metastases.      08/12/2015 Imaging    CT chest: No findings to suggest metastatic disease in thorax. Asymmetric skin thickening in left breast.      08/25/2015 - 10/06/2015 Neo-Adjuvant Chemotherapy    Adriamycin/Cytoxan x 4 cycles.       10/20/2015 - 12/23/2015 Neo-Adjuvant Chemotherapy    Taxol x 10 cycles (last 2 cycles d/c'd due to side effects).       02/02/2016 Surgery    (L) mastectomy with SLNB and regional LND ([place]): IDC, grade 2, spanning 10 cm; LCIS;  1/1 SLN positive for metastatic carcinoma. 3/3 LNs positive for micrometastatic disease. Negative margins.  ER+ (60%), PR-, HER2 neg (ratio 1.00).  ypT3, ypN2a: Stage IIIA       02/14/2016 Surgery    Left axillary regional lymph node dissection ([place]): 2/8 LNs positive for metastatic carcinoma (1 with micromets & 1 with macromets).  Pathologic stage remains unchanged (ypN2a).       04/02/2016 Imaging    Bone scan: No definite scintigraphic evidence of osseous metastatic disease      04/02/2016 Imaging    CT chest: No evidence of metastatic disease within the chest.      04/02/2016 - 05/21/2016 Radiation Therapy    Adjuvant radiation therapy Isidore Moos): Left Chest Wall, SCV, Axilla, IM nodes / 50.4Gy in 28  fractions (IM nodes receiving 45Gy+).  Left Chest wall scar boost / 10 Gy in 5 fractions.       Concurrent Chemotherapy    Concurrent Xeloda with radiation therapy; completed on 05/21/16      05/2016 -  Anti-estrogen oral therapy    Anastrozole 1 mg daily. Planned duration of treatment: 5-10 years.  Also on PALLAS trial; randomized to Ibrance arm.        INTERVAL HISTORY:  Alison [name] presents to the [facility] today for our initial meeting to review her survivorship care plan detailing her treatment course for breast cancer, as well as monitoring long-term side effects of that treatment, education regarding health maintenance, screening, and overall wellness and health promotion.     Overall, Ms. Dempsey reports feeling quite well since completing her radiation therapy approximately 3.5 months ago.  She started anastrozole in 05/2016; she is tolerating the medication relatively well, with the exception of significant arthralgias.  She is also enrolled in the PALLAS trial and was randomized to receive Ibrance. She started her most recent cycle of Ibrance on 08/24/16.  She has some right breast pain that radiates to her axilla. She describes the pain as sharp, and painful with deep breaths. Her current pain medication regimen does help alleviate this pain, but she would like me to  examine her breasts today to ensure there are no findings concerning in the right breast.  She continues to have some peripheral neuropathy s/p chemotherapy. She takes gabapentin regularly for this which is helpful. She has been working with physical therapy for lymphedema. Since finishing chemotherapy, she tells me she is encouraged that her "chemo brain" is beginning to improve.  She has not had a bone density scan. Her right breast unilateral mammogram was done in 05/2016 and was negative.    REVIEW OF SYSTEMS:  Review of Systems  HENT:  Negative.   Eyes: Negative.   Respiratory: Negative.     Cardiovascular: Negative.   Gastrointestinal: Negative.   Endocrine: Negative.   Genitourinary: Negative.  Negative for vaginal bleeding.   Musculoskeletal: Positive for arthralgias.  Skin: Negative.   Neurological: Positive for numbness (Peripheral neuropathy).  Psychiatric/Behavioral: The patient is nervous/anxious.   Breast: per HPI.    A 14-point review of systems was completed and was negative, except as noted above.   ONCOLOGY TREATMENT TEAM:  1. Surgeon:  Dr. Barry Dienes at [facility] 2. Medical Oncologist: Dr. [name]  3. Radiation Oncologist: Dr. Isidore Moos    PAST MEDICAL/SURGICAL HISTORY:  Past Medical History:  Diagnosis Date  . Asthma     seasonal asthma  . Breast cancer ([place])   . Complication of anesthesia 2007   aspiration pneumonia post hysterectomy.  Blood pressure would drop low- but no problems 01/2016- surgery  . History of blood transfusion   . History of mitral valve prolapse 1988   with palpitations  . History of radiation therapy 04/02/16- 05/21/16   Left Chest Wall, SCV, Axilla, IM nodes  . Hypertension   . Neuropathy ([place])    with chemo  . Osteopenia determined by x-ray   . Pneumonia    1980's and 1990's  . Raynaud's disease   . 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 Skin cancer 2014   basal- back   Past Surgical History:  Procedure Laterality Date  . ABDOMINAL HYSTERECTOMY  2007   with BSO  . APPENDECTOMY  2007  . AXILLARY LYMPH NODE DISSECTION Left 02/14/2016   Procedure: LEFT AXILLARY LYMPH NODE DISSECTION;  Surgeon: [name], MD;  Location: [facility];  Service: General;  Laterality: Left;  . BREAST RECONSTRUCTION WITH PLACEMENT OF TISSUE EXPANDER AND FLEX HD (ACELLULAR HYDRATED DERMIS) Left 02/02/2016   Procedure: IMMEDIATE LEFT BREAST RECONSTRUCTION WITH PLACEMENT OF TISSUE EXPANDER AND FLEX HD (ACELLULAR HYDRATED DERMIS);  Surgeon: [name], DO;  Location: [facility];   Service: Plastics;  Laterality: Left;  . BREAST SURGERY Left 02/02/16   mastectomy with reconstruction  . c-section  1991  . COLON RESECTION SIGMOID  12/02/2012   [facility]   . HERNIA REPAIR  2007  . LAPAROSCOPIC ENDOMETRIOSIS FULGURATION  1984, 1989  . Port- a- cath placement  07/2015  . PORT-A-CATH REMOVAL Right 02/02/2016   Procedure: REMOVAL PORT-A-CATH;  Surgeon: [name], MD;  Location: [place];  Service: General;  Laterality: Right;  . RADIOACTIVE SEED GUIDED AXILLARY SENTINEL LYMPH NODE Left 02/02/2016   Procedure: LEFT BREAST MASTECTOMY AND RADIOACTIVE SEED GUIDED LEFT SENTINEL LYMPH NODE BIOPSY ;  Surgeon: [name], MD;  Location: [facility];  Service: General;  Laterality: Left;     ALLERGIES:  Allergies  Allergen Reactions  . Sulfa Antibiotics Other (See Comments)    Blisters in mouth  . Zofran [Ondansetron Hcl] Other (See Comments)  Severe Headaches      CURRENT MEDICATIONS:  Outpatient Encounter Prescriptions as of 08/31/2016  Medication Sig Note  . anastrozole (ARIMIDEX) 1 MG tablet Take 1 tablet (1 mg total) by mouth daily. 07/04/2016: First dose taken on 06/27/2016.  . cetirizine (ZYRTEC) 10 MG tablet Take 10 mg by mouth daily.   . Cholecalciferol (VITAMIN D3) 2000 UNITS capsule Take 2,000 Units by mouth daily.    . Fenofibric Acid 105 MG TABS Take 1 tablet by mouth daily.   . fluticasone (FLONASE) 50 MCG/ACT nasal spray Place 2 sprays into both nostrils daily. Reported on 11/18/2015   . gabapentin (NEURONTIN) 300 MG capsule Take 1 capsule (300 mg total) by mouth 3 (three) times daily.   Marland Kitchen ibuprofen (ADVIL,MOTRIN) 200 MG tablet Take 400 mg by mouth every 6 (six) hours as needed for headache, mild pain or moderate pain. Reported on 01/06/2016 08/21/2016: Taking occasionally for joint pain  . Investigational aspirin/placebo 300 MG tablet Alliance [id number] Take 1 tablet by mouth daily. Take with food or a full glass of water.  Do not crush  enteric coated tablets.   . Investigational palbociclib (IBRANCE) 125 MG capsule [employer] AFT-05 PALLAS Take 1 capsule (125 mg total) by mouth daily. Take with food. Swallow whole. Do not chew. Take on days 1-21. Repeat every 28 days.   Marland Kitchen levocetirizine (XYZAL) 5 MG tablet Take 5 mg by mouth every evening.   Marland Kitchen LORazepam (ATIVAN) 0.5 MG tablet Take 0.5 mg by mouth at bedtime.   Marland Kitchen losartan (COZAAR) 50 MG tablet Take 1 tablet (50 mg total) by mouth daily.   . Multiple Vitamin (MULTIVITAMIN) tablet Take 1 tablet by mouth daily.   Marland Kitchen olmesartan (BENICAR) 20 MG tablet Take 1 tablet (20 mg total) by mouth daily.   Marland Kitchen omega-3 acid ethyl esters (LOVAZA) 1 g capsule Take 1 g by mouth 2 (two) times daily.   Marland Kitchen oxyCODONE (OXY IR/ROXICODONE) 5 MG immediate release tablet Take 1-2 tablets (5-10 mg total) by mouth every 4 (four) hours as needed for moderate pain, severe pain or breakthrough pain.   Marland Kitchen oxyCODONE (OXYCONTIN) 20 mg 12 hr tablet Take 2 tablets (40 mg total) by mouth every 12 (twelve) hours.   . polyethylene glycol (MIRALAX / GLYCOLAX) packet Take 17 g by mouth daily as needed for mild constipation. Reported on 01/06/2016   . Probiotic Product (PROBIOTIC DAILY PO) Take 1 capsule by mouth daily. [place] prochlorperazine (COMPAZINE) 10 MG tablet Take 1 tablet (10 mg total) by mouth every 6 (six) hours as needed (Nausea or vomiting).   . traMADol (ULTRAM) 50 MG tablet Take 1-2 tablets (50-100 mg total) by mouth every 6 (six) hours as needed for moderate pain.   . traZODone (DESYREL) 50 MG tablet Take 3 tablets (150 mg total) by mouth at bedtime.   Marland Kitchen zolpidem (AMBIEN) 5 MG tablet Take 1 tablet (5 mg total) by mouth at bedtime as needed for sleep.    No facility-administered encounter medications on file as of 08/31/2016.      ONCOLOGIC FAMILY HISTORY:  Family History  Problem Relation Age of Onset  . Lung cancer Father      GENETIC  COUNSELING/TESTING: None.  SOCIAL HISTORY:  KAITLEN REDFORD is married and lives with her husband in [place], [place]. She has 1 son.  Ms. Martinezgarcia is retired; she previously worked as a Electrical engineer at [employer]  Denies  any current tobacco or illicit drug use. Drinks alcohol regularly.     PHYSICAL EXAMINATION:  Vital Signs: Vitals:   08/31/16 0946  BP: (!) 105/59  Pulse: 98  Resp: 18  Temp: 98.4 F (36.9 C)   Filed Weights   08/31/16 0946  Weight: 162 lb (73.5 kg)   General: Well-nourished, well-appearing female in no acute distress.  She is unaccompanied today.   HEENT: Head is normocephalic.  Pupils equal and reactive to light. Conjunctivae clear without exudate.  Sclerae anicteric. Oral mucosa is pink, moist.  Oropharynx is pink without lesions or erythema.  Lymph: No cervical, supraclavicular, or infraclavicular lymphadenopathy noted on palpation.  Cardiovascular: Regular rate and rhythm.Marland Kitchen Respiratory: Clear to auscultation bilaterally. Chest expansion symmetric; breathing non-labored.  Breast Exam:  -Left breast: s/p mastectomy with reconstruction; skin tight and firm to palpation likely d/t fibrosis; scar well-healed and no nodularity noted.  -Right breast: No palpable findings in area of patient's reported pain to lateral breast/axilla.  -Axilla: No palpable lymphadenopathy noted bilaterally. Mild left axilla and lateral chest wall lymphedema.  GI: Abdomen soft and round; non-tender, non-distended. Bowel sounds normoactive.  GU: Deferred.  Neuro: No focal deficits. Steady gait.  Psych: Mood and affect normal and appropriate for situation.  Extremities: No edema. Skin: Warm and dry.   LABORATORY DATA:  None for this visit.   DIAGNOSTIC IMAGING:  Most recent mammogram: Right breast only-06/26/16      ASSESSMENT AND PLAN:  Ms.. Gotschall is a pleasant 56 y.o. 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  She went on to have left mastectomy with both sentinel and regional lymph node biopsies, which revealed 4/4 LNs positive for metastatic disease (1 macromets, 3 micromets).  She then underwent left axillary regional lymph node dissection which revealed 2/8 LNs positive for metastatic disease (1 macromets, 1 micromets).  Next, she completed concurrent chemoradiation with Xeloda; completed treatment on 05/21/16.  She started anti-estrogen therapy with anastrozole in 05/2016. She also enrolled in the PALLAS trial and was randomized to receive concurrent Ibrance with anti-estrogen therapy. She presents to the Survivorship Clinic for our initial meeting and routine follow-up post-completion of treatment for breast cancer.    1. Stage IIIA left breast cancer:  Ms. Schmale is continuing to recover from definitive treatment for breast cancer. She had her unilateral right breast mammogram on 06/26/16, which was negative for malignancy.  She will follow-up with her medical oncologist, Dr. [name], on 09/20/16 with history and physical exam per surveillance protocol.  She will continue her anti-estrogen therapy with anastrozole. Thus far, she is tolerating the medication well, with her main reported side effects being arthralgias; her pain is managed on her current regimen of OxyContin, oxycodone, and gabapentin. Today, a comprehensive survivorship care plan and treatment summary was reviewed with the patient today detailing her breast cancer diagnosis, treatment course, potential late/long-term effects of treatment, appropriate follow-up care with recommendations for the future, and patient education resources.  A copy of this summary, along with a letter will be sent to the patient's primary care provider via mail/fax/In Basket message after today's visit.     2. PALLAS trial: I reviewed the trial briefly with her today.  She will also continued the Ibrance, under the care and direction of Dr. [name]. Her last cycle was briefly held d/t  cytopenias. Her counts have since recovered and she started her current cycle on 08/24/16.  She asked if grapefruit juice was contraindicated while taking Ibrance. Upon my review, it is recommended that she avoid grapefruit and grapefruit juice while taking Ibrance d/t the potential for altered metabolism and increased effects of Ibrance (according to Up-to-Date).  She will continue to follow-up with Dr. [name] and have labs collected periodically to monitor her counts while taking Ibrance.   3. Right breast pain: There are no palpable findings to her right lateral breast in the area of patient's expressed concerns. Given that she notices this pain with deep breaths and she has been working with PT/stretching more, she could have a strained muscle or mild costochondritis. Her current pain medication regimen helps manage the sharp pain. Encouraged her to continue to monitor her symptoms and make Dr. [name] aware if they do not improve.  4. Lymphedema: AlisonJarnagin does have evidence of mild lymphedema to her left axilla and lateral chest wall. She has been working closely with physical therapy to address her lymphedema, and will be fitted for lymphedema garments soon. Encouraged her to continue to perform her exercises she learned in PT and adhere to their recommendations.   5. Bone health:  Given Ms. Devin's age, history of breast cancer, and her current treatment regimen including anti-estrogen therapy with anastrozole, she is at risk for bone demineralization.  We do not have any available DEXA scan records available for review. Mrs. Mahar understands that we need a baseline bone density assessment since she has started aromatase inhibitor therapy. Therefore, I will place orders for DEXA scan imaging today, and try to get  this completed before she sees Dr. [name] on 09/20/16.  In the meantime, she was encouraged to increase her consumption of foods rich in calcium, as well as increase her weight-bearing activities.  She was given education on specific activities to promote bone health.  6. Cancer screening:  Due to Ms. Stlaurent's history and her age, she should receive screening for skin cancers, colon cancer, and gynecologic cancers.  The information and recommendations are listed on the patient's comprehensive care plan/treatment summary and were reviewed in detail with the patient.    7. Health maintenance and wellness promotion: Ms. Nagele was encouraged to consume 5-7 servings of fruits and vegetables per day. We reviewed the "Nutrition Rainbow" handout, as well as the handout "Take Control of Your Health and Reduce Your Cancer Risk" from the [facility].  She was also encouraged to engage in moderate to vigorous exercise for 30 minutes per day most days of the week. We discussed the [employer] fitness program, which is designed for cancer survivors to help them become more physically fit after cancer treatments.  She was instructed to limit her alcohol consumption and continue to abstain from tobacco use.     8. Support services/counseling: It is not uncommon for this period of the patient's cancer care trajectory to be one of many emotions and stressors.  We discussed an opportunity for her to participate in the next session of [facility] ("Finding Your New Normal") support group series designed for patients after they have completed treatment.   Ms. Cannedy was encouraged to take advantage of our many other support services programs, support groups, and/or counseling in coping with her new life as a cancer survivor after completing anti-cancer treatment.  She was offered support today through active listening and expressive supportive counseling.  She was given information regarding our available services  and  encouraged to contact me with any questions or for help enrolling in any of our support group/programs.    Dispo:   -Baseline DEXA scan due; orders placed today; will try to get done before patient returns to cancer center to see Dr. [name].  -Return to cancer center to see Dr. [name] on 09/20/16.  -She is welcome to return back to the Survivorship Clinic at any time; no additional follow-up needed at this time.  -Consider referral back to survivorship as a long-term survivor for continued surveillance   A total of 65 minutes of face-to-face time was spent with this patient with greater than 50% of that time in counseling and care-coordination.   Mike Craze, NP Survivorship Program [place] [phone]   Note: PRIMARY CARE PROVIDER [name], [place] [phone]

## 2016-09-04 ENCOUNTER — Encounter: Payer: Self-pay | Admitting: Adult Health

## 2016-09-04 ENCOUNTER — Ambulatory Visit: Payer: 59 | Admitting: Physical Therapy

## 2016-09-04 DIAGNOSIS — M79601 Pain in right arm: Secondary | ICD-10-CM

## 2016-09-04 DIAGNOSIS — R293 Abnormal posture: Secondary | ICD-10-CM | POA: Diagnosis not present

## 2016-09-04 DIAGNOSIS — I890 Lymphedema, not elsewhere classified: Secondary | ICD-10-CM

## 2016-09-04 DIAGNOSIS — M25612 Stiffness of left shoulder, not elsewhere classified: Secondary | ICD-10-CM

## 2016-09-04 DIAGNOSIS — M25512 Pain in left shoulder: Secondary | ICD-10-CM

## 2016-09-05 ENCOUNTER — Encounter: Payer: Self-pay | Admitting: *Deleted

## 2016-09-05 ENCOUNTER — Other Ambulatory Visit (HOSPITAL_BASED_OUTPATIENT_CLINIC_OR_DEPARTMENT_OTHER): Payer: 59

## 2016-09-05 DIAGNOSIS — C50412 Malignant neoplasm of upper-outer quadrant of left female breast: Secondary | ICD-10-CM | POA: Diagnosis not present

## 2016-09-05 DIAGNOSIS — Z170 Estrogen receptor positive status [ER+]: Principal | ICD-10-CM

## 2016-09-05 DIAGNOSIS — Z006 Encounter for examination for normal comparison and control in clinical research program: Secondary | ICD-10-CM

## 2016-09-05 LAB — COMPREHENSIVE METABOLIC PANEL
ALT: 21 U/L (ref 0–55)
AST: 23 U/L (ref 5–34)
Albumin: 3.8 g/dL (ref 3.5–5.0)
Alkaline Phosphatase: 65 U/L (ref 40–150)
Anion Gap: 9 mEq/L (ref 3–11)
BUN: 16.8 mg/dL (ref 7.0–26.0)
CO2: 26 mEq/L (ref 22–29)
Calcium: 10 mg/dL (ref 8.4–10.4)
Chloride: 108 mEq/L (ref 98–109)
Creatinine: 1.1 mg/dL (ref 0.6–1.1)
EGFR: 57 mL/min/{1.73_m2} — ABNORMAL LOW (ref >90)
GLUCOSE: 120 mg/dL (ref 70–140)
POTASSIUM: 4 meq/L (ref 3.5–5.1)
SODIUM: 143 meq/L (ref 136–145)
Total Bilirubin: 0.35 mg/dL (ref 0.20–1.20)
Total Protein: 7.2 g/dL (ref 6.4–8.3)

## 2016-09-05 LAB — CBC WITH DIFFERENTIAL/PLATELET
BASO%: 2.1 % — AB (ref 0.0–2.0)
Basophils Absolute: 0 10*3/uL (ref 0.0–0.1)
EOS%: 5 % (ref 0.0–7.0)
Eosinophils Absolute: 0.1 10*3/uL (ref 0.0–0.5)
HEMATOCRIT: 34.6 % — AB (ref 34.8–46.6)
HGB: 11.8 g/dL (ref 11.6–15.9)
LYMPH#: 0.6 10*3/uL — AB (ref 0.9–3.3)
LYMPH%: 37.1 % (ref 14.0–49.7)
MCH: 31.2 pg (ref 25.1–34.0)
MCHC: 34 g/dL (ref 31.5–36.0)
MCV: 91.8 fL (ref 79.5–101.0)
MONO#: 0.1 10*3/uL (ref 0.1–0.9)
MONO%: 6.6 % (ref 0.0–14.0)
NEUT#: 0.8 10*3/uL — ABNORMAL LOW (ref 1.5–6.5)
NEUT%: 49.2 % (ref 38.4–76.8)
PLATELETS: 311 10*3/uL (ref 145–400)
RBC: 3.77 10*6/uL (ref 3.70–5.45)
RDW: 15.5 % — AB (ref 11.2–14.5)
WBC: 1.7 10*3/uL — AB (ref 3.9–10.3)

## 2016-09-05 NOTE — Therapy (Signed)
[place], [place], [address] Phone: [phone]   Fax:  [phone]  Physical Therapy Treatment  Patient Details  Name: Alison [name] MRN: [id number] Date of Birth: Nov 17, 1960 Referring Provider: [name]   Encounter Date: 09/04/2016      PT End of Session - 09/05/16 1254    Visit Number 15   Number of Visits 17   Date for PT Re-Evaluation 10/11/16   PT Start Time 1430   PT Stop Time 1515   PT Time Calculation (min) 45 min   Activity Tolerance Patient tolerated treatment well   Behavior During Therapy [facility] for tasks assessed/performed      Past Medical History:  Diagnosis Date  . Asthma     seasonal asthma  . Breast cancer ([place])   . Complication of anesthesia 2007   aspiration pneumonia post hysterectomy.  Blood pressure would drop low- but no problems 01/2016- surgery  . History of blood transfusion   . History of mitral valve prolapse 1988   with palpitations  . History of radiation therapy 04/02/16- 05/21/16   Left Chest Wall, SCV, Axilla, IM nodes  . Hypertension   . Neuropathy ([place])    with chemo  . Osteopenia determined by x-ray   . Pneumonia    1980's and 1990's  . Raynaud's disease   . 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 Skin cancer 2014   basal- back    Past Surgical History:  Procedure Laterality Date  . ABDOMINAL HYSTERECTOMY  2007   with BSO  . APPENDECTOMY  2007  . AXILLARY LYMPH NODE DISSECTION Left 02/14/2016   Procedure: LEFT AXILLARY LYMPH NODE DISSECTION;  Surgeon: [name], MD;  Location: [place];  Service: General;  Laterality: Left;  . BREAST RECONSTRUCTION WITH PLACEMENT OF TISSUE EXPANDER AND FLEX HD (ACELLULAR HYDRATED DERMIS) Left 02/02/2016   Procedure: IMMEDIATE LEFT BREAST RECONSTRUCTION WITH PLACEMENT OF TISSUE EXPANDER AND FLEX HD (ACELLULAR HYDRATED DERMIS);  Surgeon: [name], DO;  Location: [facility];  Service: Plastics;  Laterality: Left;  . BREAST SURGERY Left 02/02/16   mastectomy with reconstruction  . c-section  1991  . COLON RESECTION SIGMOID  12/02/2012   [facility]   . HERNIA REPAIR  2007  . LAPAROSCOPIC ENDOMETRIOSIS FULGURATION  1984, 1989  . Port- a- cath placement  07/2015  . PORT-A-CATH REMOVAL Right 02/02/2016   Procedure: REMOVAL PORT-A-CATH;  Surgeon: [name], MD;  Location: [place];  Service: General;  Laterality: Right;  . RADIOACTIVE SEED GUIDED AXILLARY SENTINEL LYMPH NODE Left 02/02/2016   Procedure: LEFT BREAST MASTECTOMY AND RADIOACTIVE SEED GUIDED LEFT SENTINEL LYMPH NODE BIOPSY ;  Surgeon: [name], MD;  Location: [facility];  Service: General;  Laterality: Left;    There were no vitals filed for this visit.      Subjective Assessment - 09/04/16 1437    Subjective Pt states that she got the Flexitouch and has used it 4 times. She is able to get it on and off by herself.  She says her lymphedema is not worse but she can't tell it is better yet.  She has not used the compression in the armpit or has not been using the compression around her neck.                LYMPHEDEMA/ONCOLOGY QUESTIONNAIRE - 09/04/16 1440      Head and Neck  4 cm superior to sternal notch around neck 53 cm   6 cm superior to sternal notch around neck 52.7 cm   8 cm superior to sternal notch around neck 52 cm                  [facility] Adult PT Treatment/Exercise - 09/05/16 0001      Self-Care   Other Self-Care Comments  made pt a new neck chip pack with comprex and foam chips to provide more comprssion at neck      Manual Therapy   Manual Lymphatic Drainage (MLD) in supine short neck, superficial and deep abdominals, right axillary nodes, anterior interaxillary anastamosis, anterior and posterior neck and Lt inguinal nodes and Lt axillo-inguinal anastomosis briefly, then to right sidelying for posterior interaxillary  anastamosis.    Kinesiotex Edema     Kinesiotix   Edema skinkote and kinsesiotap in fan shape along left lateral trunk and axilla                         Long Term Clinic Goals - 08/30/16 1801      CC Long Term Goal  #1   Title Patient will report a decrease in left shoulder pain by 75% so she can perform daily activities with greater ease   Time 6   Status On-going     CC Long Term Goal  #2   Title Reduce circumference measurement at 6 cm superior to sternal notch to 50 cm or less.   Baseline 54.4 at eval, 52 cm at 06/26/2016 . 51 cm on 07/25/2016, 52.5 at 08/30/2016   Time 6   Period Weeks   Status On-going     CC Long Term Goal  #3   Title Patient will be independent in self manual lymph drainage.   Status Achieved     CC Long Term Goal  #4   Title Patient will increase right shoulder flexion AROM to 140 degrees without any pain for improved ability to perform ADLs.   Baseline 145 on 10/31, 130 degrees 07/25/2016  limited by pain    Time 6   Period Weeks   Status On-going     CC Long Term Goal  #5   Title Patient will utilize chip pack for at least 2 hours per day at home to assess whether compression garment would be beneficial.   Baseline chip packs help, but fluid returns when chip pack is not used ; pt using chip pack, working on being more consistent while having it stay in place-08/01/16  pt states she is using chip pack    Status Achieved     CC Long Term Goal  #6   Title Pt will be independent in strenthening program    Baseline pt with too much pain with resistive exercise    Status Deferred            Plan - 09/05/16 1254    Clinical Impression Statement Pt is doing better this week with less pain and decrease in circumferential measurements around neck.  She has been using her flexitouch and a new chip pack was made for neck as her old one is not maintaining compression.  She has not heard about her jovipack compression that was measured.   contacted [facility] about it and they say they are waiting on presciption,  will follow up with email again    [place]  Potential chemotherapy with neuropathy    PT Frequency 1x / week   PT Duration 6 weeks   PT Treatment/Interventions ADLs/Self Care Home Management;Manual lymph drainage;Compression bandaging;Scar mobilization;Passive range of motion;Patient/family education;Taping;Manual techniques;Therapeutic exercise;Therapeutic activities;Orthotic Fit/Training;DME Instruction   PT Next Visit Plan Remeasure neck circumference update HEP    Consulted and Agree with Plan of Care Patient      Patient will benefit from skilled therapeutic intervention in order to improve the following deficits and impairments:  Decreased range of motion, Impaired UE functional use, Decreased activity tolerance, Decreased knowledge of precautions, Decreased skin integrity, Impaired perceived functional ability, Pain, Decreased knowledge of use of DME, Increased edema, Postural dysfunction, Decreased strength, Impaired sensation, Decreased scar mobility  Visit Diagnosis: Abnormal posture  Lymphedema, not elsewhere classified  Left shoulder pain, unspecified chronicity  Stiffness of left shoulder joint  Pain in right arm     Problem List Patient Active Problem List   Diagnosis Date Noted  . Primary malignant neoplasm of left breast with stage 2 nodal metastasis per American Joint Committee on Cancer 7th edition (N2) ([place]) 02/14/2016  . Chemotherapy-induced neuropathy ([place]) 12/30/2015  . Insomnia 09/01/2015  . Breast cancer of upper-outer quadrant of left female breast ([place]) 08/08/2015   Donato Heinz. Owens Shark PT  Norwood Levo 09/05/2016, 12:59 PM  [place], [place], [address] Phone: [phone]   Fax:  [phone]  Name: Alison [name] MRN: [id number] Date of  Birth: 1961-02-11

## 2016-09-05 NOTE — Progress Notes (Signed)
09/05/2016 Patient in to clinic for hematology assessment on Cycle 2, Day 14. Patient states that she is generally feeling well, other than ongoing joint pains, felt to be related to anastrozole. Patient brought her palbociclib study medication and drug diary to clinic today for review. Patient reports that due to being out of town on vacation, she forgot to start palbociclib on 08/23/16, but took her first dose on Friday 08/24/2016. This has been documented on the diary. In addition, patient reports that she took today's dose, then stated "You told me I wasn't supposed to take it until I came in for lab tests". Reassured patient, and instructed her not to take tomorrow's dose without notification from the clinic. Research nurse to call patient once CBC results have been obtained, regarding whether treatment should continue, or if it needs to be held due to toxicity. Patient did not bring anti-hormone therapy diaries with her today, but will return these at her next clinic visit. She reports that she is filling them out as instructed. Cindy S. Brigitte Pulse BSN, RN, [place] 09/05/2016 10:31 AM  09/05/2016 Due to [place] of [address] today, voice mail message left at patient's home number, instructing her not to take additional doses of palbociclib for this treatment cycle. She is to continue taking anastrozole daily. Instructed patient to return remaining palbociclib and all PALLAS study drug diaries, at her next visit in two weeks. As previously discussed with patient, she will be restarted at a lower dose of palbociclib, due to this second occurrence of grade 3 neutropenia. Requested that patient call me back at [phone] to confirm receipt of this message. Cindy S. Brigitte Pulse BSN, RN, [place] 09/05/2016 10:58 AM  09/05/2016 Received return phone call from patient, stating that she received my message. Instructions to hold study medication (palbociclib) were reviewed with her and she understands. Cindy S. Brigitte Pulse BSN, RN, CCRP 09/05/2016  11:00 AM

## 2016-09-06 ENCOUNTER — Encounter: Payer: 59 | Admitting: Physical Therapy

## 2016-09-07 ENCOUNTER — Ambulatory Visit
Admission: RE | Admit: 2016-09-07 | Discharge: 2016-09-07 | Disposition: A | Discharge status: Home / Self Care | Payer: 59 | Source: Ambulatory Visit | Attending: Adult Health | Admitting: Adult Health

## 2016-09-07 DIAGNOSIS — Z780 Asymptomatic menopausal state: Secondary | ICD-10-CM

## 2016-09-07 DIAGNOSIS — Z79811 Long term (current) use of aromatase inhibitors: Secondary | ICD-10-CM

## 2016-09-10 ENCOUNTER — Telehealth: Payer: Self-pay

## 2016-09-10 NOTE — Telephone Encounter (Signed)
Called left vm to inform pt bone density shows osteopenia and that recommendations are to take calcium and vitamin d supplements. Also adding wt. Bearing exercises. Call back phone number provided for pt.

## 2016-09-11 ENCOUNTER — Other Ambulatory Visit: Payer: Self-pay | Admitting: *Deleted

## 2016-09-11 DIAGNOSIS — C50412 Malignant neoplasm of upper-outer quadrant of left female breast: Secondary | ICD-10-CM

## 2016-09-11 MED ORDER — OXYCODONE HCL 5 MG PO TABS: 5.0000 mg | ORAL_TABLET | ORAL | 0 refills | Status: DC | PRN

## 2016-09-11 MED ORDER — OXYCODONE HCL ER 20 MG PO T12A: 40.0000 mg | EXTENDED_RELEASE_TABLET | Freq: Two times a day (BID) | ORAL | 0 refills | Status: DC

## 2016-09-13 ENCOUNTER — Ambulatory Visit: Payer: 59 | Admitting: Physical Therapy

## 2016-09-19 ENCOUNTER — Encounter: Payer: Self-pay | Admitting: Oncology

## 2016-09-19 ENCOUNTER — Ambulatory Visit: Payer: 59 | Admitting: Physical Therapy

## 2016-09-19 ENCOUNTER — Other Ambulatory Visit: Payer: Self-pay | Admitting: *Deleted

## 2016-09-19 DIAGNOSIS — M79601 Pain in right arm: Secondary | ICD-10-CM

## 2016-09-19 DIAGNOSIS — R293 Abnormal posture: Secondary | ICD-10-CM | POA: Diagnosis not present

## 2016-09-19 DIAGNOSIS — M25512 Pain in left shoulder: Secondary | ICD-10-CM

## 2016-09-19 DIAGNOSIS — I890 Lymphedema, not elsewhere classified: Secondary | ICD-10-CM

## 2016-09-19 DIAGNOSIS — M25612 Stiffness of left shoulder, not elsewhere classified: Secondary | ICD-10-CM

## 2016-09-19 NOTE — Therapy (Signed)
[place], [place], [address] Phone: [phone]   Fax:  [phone]  Physical Therapy Treatment  Patient Details  Name: Alison [name] MRN: [id number] Date of Birth: 1961-02-27 Referring Provider: [name]   Encounter Date: 09/19/2016      PT End of Session - 09/19/16 1226    Visit Number 16   Number of Visits 23   Date for PT Re-Evaluation 10/11/16   PT Start Time [id number]   PT Stop Time 1100   PT Time Calculation (min) 45 min   Activity Tolerance Patient tolerated treatment well   Behavior During Therapy [facility] for tasks assessed/performed      Past Medical History:  Diagnosis Date  . Asthma     seasonal asthma  . Breast cancer ([place])   . Complication of anesthesia 2007   aspiration pneumonia post hysterectomy.  Blood pressure would drop low- but no problems 01/2016- surgery  . History of blood transfusion   . History of mitral valve prolapse 1988   with palpitations  . History of radiation therapy 04/02/16- 05/21/16   Left Chest Wall, SCV, Axilla, IM nodes  . Hypertension   . Neuropathy ([place])    with chemo  . Osteopenia determined by x-ray   . Pneumonia    1980's and 1990's  . Raynaud's disease   . 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 Skin cancer 03/12/2014   basal- back    Past Surgical History:  Procedure Laterality Date  . ABDOMINAL HYSTERECTOMY  2007   with BSO  . APPENDECTOMY  2007  . AXILLARY LYMPH NODE DISSECTION Left 02/14/2016   Procedure: LEFT AXILLARY LYMPH NODE DISSECTION;  Surgeon: [name], MD;  Location: [facility];  Service: General;  Laterality: Left;  . BREAST RECONSTRUCTION WITH PLACEMENT OF TISSUE EXPANDER AND FLEX HD (ACELLULAR HYDRATED DERMIS) Left 02/02/2016   Procedure: IMMEDIATE LEFT BREAST RECONSTRUCTION WITH PLACEMENT OF TISSUE EXPANDER AND FLEX HD (ACELLULAR HYDRATED DERMIS);  Surgeon: [name], DO;  Location:  [facility];  Service: Plastics;  Laterality: Left;  . BREAST SURGERY Left 02/02/16   mastectomy with reconstruction  . c-section  1991  . COLON RESECTION SIGMOID  12/02/2012   [facility]   . HERNIA REPAIR  2007  . LAPAROSCOPIC ENDOMETRIOSIS FULGURATION  1984, 1989  . Port- a- cath placement  07/2015  . PORT-A-CATH REMOVAL Right 02/02/2016   Procedure: REMOVAL PORT-A-CATH;  Surgeon: [name], MD;  Location: [facility];  Service: General;  Laterality: Right;  . RADIOACTIVE SEED GUIDED AXILLARY SENTINEL LYMPH NODE Left 02/02/2016   Procedure: LEFT BREAST MASTECTOMY AND RADIOACTIVE SEED GUIDED LEFT SENTINEL LYMPH NODE BIOPSY ;  Surgeon: [name], MD;  Location: [facility];  Service: General;  Laterality: Left;    There were no vitals filed for this visit.      Subjective Assessment - 09/19/16 1029    Subjective (P)  Pt states that she is using the flexitouch twice a day and feels that it is better at her neck and abdomen . she still has pain in both shoulder in her left axilla.    Pertinent History (P)  Breast cancer diagnosed November2016 She had chemotherapy. but did have some neuropathy so it had to be stopped. On June 8, she had a mastectomy with immedicate expander placement and with first set of lymphnodes removed, On June 20 she went for complete axillary lymph node  dissection. She says she has had always been "full necked" but has neck and upper body fullness since the cancer was diagnosed. She reports she has osteopenia in low back and has had a fracture that was discovered in 2014 when she had a Dexa scan.   Patient Stated Goals (P)  to decrease the edema   Currently in Pain? (P)  Yes   Pain Score (P)  7    Pain Location (P)  Breast   Pain Orientation (P)  Right   Pain Descriptors / Indicators (P)  Constant   Pain Type (P)  Chronic pain   Pain Onset (P)  More than a month ago   Pain Frequency (P)  Constant   Aggravating Factors  (P)  can't  say    Pain Relieving Factors (P)  medication    Effect of Pain on Daily Activities (P)  difficulty getting started with moving unless she takes medication                          [facility] Adult PT Treatment/Exercise - 09/19/16 0001      Manual Therapy   Manual Lymphatic Drainage (MLD) in supine short neck, superficial and deep abdominals, right axillary nodes, anterior interaxillary anastamosis, anterior and posterior neck and Lt inguinal nodes and Lt axillo-inguinal anastomosis briefly, then to right sidelying for posterior interaxillary anastamosis.                         [facility] Goals - 08/30/16 1801      CC Long Term Goal  #1   Title Patient will report a decrease in left shoulder pain by 75% so she can perform daily activities with greater ease   Time 6   Status On-going     CC Long Term Goal  #2   Title Reduce circumference measurement at 6 cm superior to sternal notch to 50 cm or less.   Baseline 54.4 at eval, 52 cm at 06/26/2016 . 51 cm on 07/25/2016, 52.5 at 08/30/2016   Time 6   Period Weeks   Status On-going     CC Long Term Goal  #3   Title Patient will be independent in self manual lymph drainage.   Status Achieved     CC Long Term Goal  #4   Title Patient will increase right shoulder flexion AROM to 140 degrees without any pain for improved ability to perform ADLs.   Baseline 145 on 10/31, 130 degrees 07/25/2016  limited by pain    Time 6   Period Weeks   Status On-going     CC Long Term Goal  #5   Title Patient will utilize chip pack for at least 2 hours per day at home to assess whether compression garment would be beneficial.   Baseline chip packs help, but fluid returns when chip pack is not used ; pt using chip pack, working on being more consistent while having it stay in place-08/01/16  pt states she is using chip pack    Status Achieved     CC Long Term Goal  #6   Title Pt will be independent in strenthening  program    Baseline pt with too much pain with resistive exercise    Status Deferred            Plan - 09/19/16 1227    Clinical Impression Statement Pt continues to have swelling in  neck, trunk and left axilla and problems with pain managment.  She is trying to increase her exercise and walking.  She has not yet received her neck comprssion gament, though it has been ordered.  Would like to continue until garment arrives    Rehab Potential Fair   Clinical Impairments Affecting Rehab Potential chemotherapy with neuropathy    PT Next Visit Plan Remeasure neck circumference update HEP       Patient will benefit from skilled therapeutic intervention in order to improve the following deficits and impairments:  Decreased range of motion, Impaired UE functional use, Decreased activity tolerance, Decreased knowledge of precautions, Decreased skin integrity, Impaired perceived functional ability, Pain, Decreased knowledge of use of DME, Increased edema, Postural dysfunction, Decreased strength, Impaired sensation, Decreased scar mobility  Visit Diagnosis: Abnormal posture  Lymphedema, not elsewhere classified  Left shoulder pain, unspecified chronicity  Stiffness of left shoulder joint  Pain in right arm     Problem List Patient Active Problem List   Diagnosis Date Noted  . Primary malignant neoplasm of left breast with stage 2 nodal metastasis per American Joint Committee on Cancer 7th edition (N2) ([place]) 02/14/2016  . 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 Insomnia 09/01/2015  . Breast cancer of upper-outer quadrant of left female breast ([place]) 08/08/2015   Donato Heinz. Owens Shark PT  [place] Levo 09/19/2016, 12:29 PM  [place], [place], [address] Phone: [phone]   Fax:  [phone]  Name: Alison [name] MRN: [id number] Date of Birth: Jan 10, 1961

## 2016-09-20 ENCOUNTER — Ambulatory Visit (HOSPITAL_BASED_OUTPATIENT_CLINIC_OR_DEPARTMENT_OTHER): Payer: 59 | Admitting: Oncology

## 2016-09-20 ENCOUNTER — Encounter: Payer: Self-pay | Admitting: *Deleted

## 2016-09-20 ENCOUNTER — Other Ambulatory Visit (HOSPITAL_BASED_OUTPATIENT_CLINIC_OR_DEPARTMENT_OTHER): Payer: 59

## 2016-09-20 VITALS — BP 123/65 | HR 109 | Temp 98.3°F | Resp 18 | Ht 62.25 in | Wt 159.7 lb

## 2016-09-20 DIAGNOSIS — Z170 Estrogen receptor positive status [ER+]: Principal | ICD-10-CM

## 2016-09-20 DIAGNOSIS — C50412 Malignant neoplasm of upper-outer quadrant of left female breast: Secondary | ICD-10-CM

## 2016-09-20 DIAGNOSIS — Z79811 Long term (current) use of aromatase inhibitors: Secondary | ICD-10-CM

## 2016-09-20 DIAGNOSIS — C779 Secondary and unspecified malignant neoplasm of lymph node, unspecified: Principal | ICD-10-CM

## 2016-09-20 DIAGNOSIS — Z006 Encounter for examination for normal comparison and control in clinical research program: Secondary | ICD-10-CM

## 2016-09-20 DIAGNOSIS — G8918 Other acute postprocedural pain: Secondary | ICD-10-CM

## 2016-09-20 DIAGNOSIS — M8580 Other specified disorders of bone density and structure, unspecified site: Secondary | ICD-10-CM | POA: Insufficient documentation

## 2016-09-20 DIAGNOSIS — G4700 Insomnia, unspecified: Secondary | ICD-10-CM

## 2016-09-20 DIAGNOSIS — C50912 Malignant neoplasm of unspecified site of left female breast: Secondary | ICD-10-CM

## 2016-09-20 DIAGNOSIS — C773 Secondary and unspecified malignant neoplasm of axilla and upper limb lymph nodes: Secondary | ICD-10-CM | POA: Diagnosis not present

## 2016-09-20 DIAGNOSIS — Z79899 Other long term (current) drug therapy: Secondary | ICD-10-CM

## 2016-09-20 LAB — CBC WITH DIFFERENTIAL/PLATELET
BASO%: 0.8 % (ref 0.0–2.0)
BASOS ABS: 0 10*3/uL (ref 0.0–0.1)
EOS ABS: 0.1 10*3/uL (ref 0.0–0.5)
EOS%: 2.1 % (ref 0.0–7.0)
HCT: 33.2 % — ABNORMAL LOW (ref 34.8–46.6)
HEMOGLOBIN: 11.4 g/dL — AB (ref 11.6–15.9)
LYMPH%: 22.5 % (ref 14.0–49.7)
MCH: 31.9 pg (ref 25.1–34.0)
MCHC: 34.5 g/dL (ref 31.5–36.0)
MCV: 92.6 fL (ref 79.5–101.0)
MONO#: 0.4 10*3/uL (ref 0.1–0.9)
MONO%: 11.6 % (ref 0.0–14.0)
NEUT%: 63 % (ref 38.4–76.8)
NEUTROS ABS: 2.4 10*3/uL (ref 1.5–6.5)
PLATELETS: 433 10*3/uL — AB (ref 145–400)
RBC: 3.58 10*6/uL — ABNORMAL LOW (ref 3.70–5.45)
RDW: 17.9 % — AB (ref 11.2–14.5)
WBC: 3.9 10*3/uL (ref 3.9–10.3)
lymph#: 0.9 10*3/uL (ref 0.9–3.3)

## 2016-09-20 LAB — COMPREHENSIVE METABOLIC PANEL
ALBUMIN: 3.9 g/dL (ref 3.5–5.0)
ALK PHOS: 76 U/L (ref 40–150)
ALT: 16 U/L (ref 0–55)
ANION GAP: 10 meq/L (ref 3–11)
AST: 21 U/L (ref 5–34)
BUN: 23 mg/dL (ref 7.0–26.0)
CALCIUM: 9.8 mg/dL (ref 8.4–10.4)
CO2: 23 mEq/L (ref 22–29)
Chloride: 104 mEq/L (ref 98–109)
Creatinine: 1.8 mg/dL — ABNORMAL HIGH (ref 0.6–1.1)
EGFR: 32 mL/min/{1.73_m2} — AB (ref >90)
Glucose: 115 mg/dl (ref 70–140)
Potassium: 4.3 mEq/L (ref 3.5–5.1)
Sodium: 138 mEq/L (ref 136–145)
TOTAL PROTEIN: 7.1 g/dL (ref 6.4–8.3)

## 2016-09-20 MED ORDER — INV-PALBOCICLIB 100 MG CAPS #23 ALLIANCE FOUNDATION AFT-05 (PALLAS): 100.0000 mg | ORAL_CAPSULE | Freq: Every day | ORAL | 0 refills | Status: DC

## 2016-09-20 MED ORDER — GABAPENTIN 300 MG PO CAPS: ORAL_CAPSULE | ORAL | 0 refills | Status: DC

## 2016-09-20 NOTE — Progress Notes (Signed)
09/20/16 at 12:06pm - PALLAS cycle 3, day 1 study notes- The pt was into the cancer center this morning for her cycle 3 assessments.  The pt was greeted upon arrival and given her PRO's/questionnaires to complete.  The research nurse reviewed the completed questionnaires for completeness and accuracy. The pt's labs were drawn.  The pt returned her completed cycle 1 and cycle 2 antihormone therapy drug diaries.  The pt also returned her cycle 2 palbociclib drug diary.  The pt confirmed that she did not take study drug from 09/06/16 through 09/12/16.  The pt also returned her cycle 2 study drug bottle.  The bottle was taken to the pharmacy.  Evelena Peat, pharmacist, verified that the pt returned 8 pills.  The pt was seen and examined by Dr. [name] today.  Dr. [name] also reviewed the pt's current medication list with the pt. Dr. [name] informed the pt that her bone density exam revealed some osteopenia.  He advised the pt that she needs to start Reclast, bisphosphonate therapy.  Dr. [name] reviewed section 9.5.1 of the protocol related to permitted medications.  Dr. [name] stated that the pt's bisphosphonate therapy is medically indicated.  Dr. [name] reviewed the pt's cbc/diff with the pt.  The pt's labs met the retreatment criteria in section 9.4.2.2 of the protocol.  Dr. [name] explained to the pt that her dose of palbociclib will be reduced to 100 mg/d on days 1-21, followed by 7 days off.  The pt verbalized understanding.  The pt was given her 3 study drug bottles (assigned bottles [id number], [id number], [phone]) for cycles 3, 4, and 5.  The pt was given her drug diaries for each cycle as well. The pt had no questions or concerns about her treatment.  The pt's creatinine was elevated to 1.8 mg/dL.  Dr. [name] was informed of this grade 2 non-hematologic AE.  Dr. [name] reviewed the retreatment criteria again related to this creatinine elevation.  Dr. [name] stated that her creatinine has not been a  "persistent" grade 2 AE.  Dr. [name] stated that the pt's creatinine had been elevated prior to the pt starting study drug.  He noted that it had resolved on 08/08/17 while the pt was taking study drug.  He stated that he does not feel that the pt's grade 2 creatinine is treatment-related.   He stated that it is "most likely related to the pt not drinking enough water".  He asked the research nurse to inform the pt to drink at least a quart of water daily.  The pt was informed by phone about her creatinine level.  She stated that she has not been drinking any water lately.  She said that she has been drinking more lemonade lately.  The pt said that she will start drinking more water.  The pt will return next week to begin her Reclast therapy.  The pt's creatinine level will be re-checked next week for ongoing monitoring.   Brion Aliment RN, BSN, CCRP Clinical Research Nurse 09/20/2016 1:53 PM

## 2016-09-20 NOTE — Progress Notes (Signed)
[place]  [phone] Fax:(336) [phone]   ID: Alison [name] DOB: Jan 22, 1961  MR#: [id number]  CBU#:[id number]  Patient Care Team: [name], MD as PCP - General (Internal Medicine) Erroll Luna, MD as Consulting Physician (General Surgery) Chauncey Cruel, MD as Consulting Physician (Oncology) Aloha Gell, MD as Consulting Physician (Obstetrics and Gynecology) Harriett Sine, MD as Consulting Physician (Dermatology) Benson Norway, RN as Registered Nurse (Oncology) Eppie Gibson, MD as Attending Physician (Radiation Oncology) PCP: [name], MD OTHER MD:  CHIEF COMPLAINT: Estrogen receptor positive breast cancer  CURRENT TREATMENT: Anastrozole, palbociclib, zolendronate  BREAST CANCER HISTORY: From the original intake note:  Alison [name] had routine screening mammography with tomography [facility] February 20 11/14/2014 showing a possible mass in the right breast. Diagnostic right mammography with right breast ultrasonography 11/01/2014 found the breast density to be category C. In the upper outer quadrant of the right breast there was a partially obscured mass which on physical exam was palpable as an area of thickening at the 10:00 position 8 cm from the nipple. Ultrasound showed multiple cysts in the upper outer right breast corresponding to the mass in question.  In November 2016 the patient felt a change in the upper left breast and brought it to her primary care physician's attention. Diagnostic left mammography with tomosynthesis and left breast ultrasonography at the [facility] December 01/14/2015 found trabecular thickening throughout the left breast with skin thickening, but no circumscribed mass or suspicious calcifications. A rounded mass in the left axilla measured 6 mm. There was an additional mildly prominent lymph node in the left axilla which was what the patient had been palpating. Ultrasonography showed  ill-defined swelling throughout the inner left breast with overlying skin thickening.. At the 2:00 position 4 cm from the nipple there was an irregular hypoechoic mass measuring 2.3 cm. A second mass at the 1:00 area 3 cm from the nipple measured 1 cm. The distance between these 2 masses was 1.8 cm. There was also a round hypoechoic left axillary mass measuring 0.6 cm. Overall the was an area of 3.7 cm of mixed echogenicity corresponding to the area of palpable soft tissue swelling.   On 08/04/2015 the patient underwent biopsy of the mass at the 2:00 axis 4 cm from the nipple and a second biopsy was performed of the hypoechoic mass at the 1:00 position 3 cm from the nipple. Biopsy of the suspicious nodule in the lower left axilla was attempted but could not be completed as the mass became obscured after administration of lidocaine.  The pathology from both left breast biopsies 08/04/2015 ([id number]) showed invasive ductal carcinoma, grade 3. The prognostic profile obtained from the larger tumor showed estrogen receptor to be strongly positive at 95%, progesterone receptor positive at 30%, with an MIB-1 of 12%, and no HER-2 amplification, the signals ratio being 1.22 and the number per cell 2.20.  On 08/09/2015 the patient underwent biopsy of this suspicious left axillary lymph node noted above, and this showed ([id number]) metastatic carcinoma with extracapsular extension.  The patient's subsequent history is as detailed below  INTERVAL HISTORY: Alison [name] returns today for follow-up of her estrogen receptor positive breast cancer. She continues on anastrozole, with good tolerance.Hot flashes and vaginal dryness are not a major issue. She never developed the arthralgias or myalgias that many patients can experience on this medication. She obtains it at a good price.  She is participating in the [place] trial and was randomized  to treatment. She had problems with counts with her first 2 cycles. We are  going to be dropping the dose beginning with this cycle 200 mg daily.  She also had a bone density since her last visit here. This shows a T score of -1.7 we discussed the implications of this in detail today.    REVIEW OF SYSTEMS:  Alison [name] continues to have pain involving the right breast, rib cage, and back. She is receiving oxycodone and OxyContin for this is very stable doses. She is using MiraLAX for constipation and is managing that successfully. She has significant insomnia problems and takes Ambien, lorazepam, and gabapentin at bedtime. Despite that sleep is difficult. The good news is that she is now able to exercise some more. She is using the treadmill 15 minutes a day. She is a little bit more active and has lost a little bit of weight. Aside from these issues a detailed review of systems today was stable   PAST MEDICAL HISTORY: Past Medical History:  Diagnosis Date  . Asthma     seasonal asthma  . Breast cancer ([place])   . Complication of anesthesia 2007   aspiration pneumonia post hysterectomy.  Blood pressure would drop low- but no problems 01/2016- surgery  . History of blood transfusion   . History of mitral valve prolapse 1988   with palpitations  . History of radiation therapy 04/02/16- 05/21/16   Left Chest Wall, SCV, Axilla, IM nodes  . Hypertension   . Neuropathy ([place])    with chemo  . Osteopenia determined by x-ray   . Pneumonia    1980's and 1990's  . Raynaud's disease   . 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 Skin cancer 03/12/2014   basal- back    PAST SURGICAL HISTORY: Past Surgical History:  Procedure Laterality Date  . ABDOMINAL HYSTERECTOMY  2007   with BSO  . APPENDECTOMY  2007  . AXILLARY LYMPH NODE DISSECTION Left 02/14/2016   Procedure: LEFT AXILLARY LYMPH NODE DISSECTION;  Surgeon: [name], MD;  Location: [facility];  Service: General;  Laterality: Left;  . BREAST RECONSTRUCTION WITH PLACEMENT OF TISSUE EXPANDER  AND FLEX HD (ACELLULAR HYDRATED DERMIS) Left 02/02/2016   Procedure: IMMEDIATE LEFT BREAST RECONSTRUCTION WITH PLACEMENT OF TISSUE EXPANDER AND FLEX HD (ACELLULAR HYDRATED DERMIS);  Surgeon: [name], DO;  Location: [facility];  Service: Plastics;  Laterality: Left;  . BREAST SURGERY Left 02/02/16   mastectomy with reconstruction  . c-section  1991  . COLON RESECTION SIGMOID  12/02/2012   [facility]   . HERNIA REPAIR  2007  . LAPAROSCOPIC ENDOMETRIOSIS FULGURATION  1984, 1989  . Port- a- cath placement  07/2015  . PORT-A-CATH REMOVAL Right 02/02/2016   Procedure: REMOVAL PORT-A-CATH;  Surgeon: [name], MD;  Location: [facility];  Service: General;  Laterality: Right;  . RADIOACTIVE SEED GUIDED AXILLARY SENTINEL LYMPH NODE Left 02/02/2016   Procedure: LEFT BREAST MASTECTOMY AND RADIOACTIVE SEED GUIDED LEFT SENTINEL LYMPH NODE BIOPSY ;  Surgeon: [name], MD;  Location: [facility];  Service: General;  Laterality: Left;    FAMILY HISTORY Family History  Problem Relation Age of Onset  . Lung cancer Father    the patient's father died from lung cancer at the age of 81 in the setting of tobacco abuse. The patient's mother died at the age of 51 with pancreatic cancer. The patient had no siblings. There is no history  of breast or ovarian cancer in the family.  GYNECOLOGIC HISTORY:  No LMP recorded. Patient has had a hysterectomy. Menarche age 58, first live birth age 64. The patient is GX P1. She is status post total hysterectomy with bilateral salpingo-oophorectomy. She has been on hormone replacement since that time and is tapering to off at the time of this dictation.   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 She used to work at [employer]. Her husband [name] works for [employer] division at a [employer] their son Alison [name] is currently at home.    ADVANCED DIRECTIVES: Not in place   HEALTH MAINTENANCE: Social  History  Substance Use Topics  . Smoking status: Former Smoker    Packs/day: 1.00    Years: 10.00    Types: Cigarettes    Quit date: 11/25/2008  . Smokeless tobacco: Never Used     Comment: on and off smoker never consistent  . Alcohol use 2.4 oz/week    4 Glasses of wine per week     Colonoscopy: April 2014/ Raynaldo Opitz at [facility]  PAP: s/p hysterectomy  Bone density: August 2016/ osteopenia  Lipid panel:  Allergies  Allergen Reactions  . Sulfa Antibiotics Other (See Comments)    Blisters in mouth  . Zofran [Ondansetron Hcl] Other (See Comments)    Severe Headaches     Current Outpatient Prescriptions  Medication Sig Dispense Refill  . anastrozole (ARIMIDEX) 1 MG tablet Take 1 tablet (1 mg total) by mouth daily. 90 tablet 4  . cetirizine (ZYRTEC) 10 MG tablet Take 10 mg by mouth daily.    . Cholecalciferol (VITAMIN D3) 2000 UNITS capsule Take 2,000 Units by mouth daily.     . Fenofibric Acid 105 MG TABS Take 1 tablet by mouth daily.    . fluticasone (FLONASE) 50 MCG/ACT nasal spray Place 2 sprays into both nostrils daily. Reported on 11/18/2015    . gabapentin (NEURONTIN) 300 MG capsule Take 1 capsule twice a day, 3 capsules at bedtime. 270 capsule 0  . Investigational aspirin/placebo 300 MG tablet [facility] [id number] Take 1 tablet by mouth daily. Take with food or a full glass of water.  Do not crush enteric coated tablets. 180 tablet 0  . Investigational palbociclib (IBRANCE) 125 MG capsule [employer] [facility] AFT-05 PALLAS Take 1 capsule (125 mg total) by mouth daily. Take with food. Swallow whole. Do not chew. Take on days 1-21. Repeat every 28 days. 23 capsule 0  . levocetirizine (XYZAL) 5 MG tablet Take 5 mg by mouth every evening.    Marland Kitchen LORazepam (ATIVAN) 0.5 MG tablet Take 0.5 mg by mouth at bedtime.    Marland Kitchen losartan (COZAAR) 50 MG tablet Take 1 tablet (50 mg total) by mouth daily. 90 tablet 4  . Multiple Vitamin (MULTIVITAMIN) tablet Take 1 tablet by mouth daily.    Marland Kitchen olmesartan  (BENICAR) 20 MG tablet Take 1 tablet (20 mg total) by mouth daily. 90 tablet 4  . omega-3 acid ethyl esters (LOVAZA) 1 g capsule Take 1 g by mouth 2 (two) times daily.    Marland Kitchen oxyCODONE (OXY IR/ROXICODONE) 5 MG immediate release tablet Take 1-2 tablets (5-10 mg total) by mouth every 4 (four) hours as needed for moderate pain, severe pain or breakthrough pain. 120 tablet 0  . oxyCODONE (OXYCONTIN) 20 mg 12 hr tablet Take 2 tablets (40 mg total) by mouth every 12 (twelve) hours. 120 tablet 0  . polyethylene glycol (MIRALAX / GLYCOLAX) packet Take 17 g  by mouth daily as needed for mild constipation. Reported on 01/06/2016    . Probiotic Product (PROBIOTIC DAILY PO) Take 1 capsule by mouth daily. [place] prochlorperazine (COMPAZINE) 10 MG tablet Take 1 tablet (10 mg total) by mouth every 6 (six) hours as needed (Nausea or vomiting). 30 tablet 2  . traZODone (DESYREL) 50 MG tablet Take 3 tablets (150 mg total) by mouth at bedtime. 90 tablet 1  . zolpidem (AMBIEN) 5 MG tablet Take 1 tablet (5 mg total) by mouth at bedtime as needed for sleep. 30 tablet 2   No current facility-administered medications for this visit.     OBJECTIVE: Middle-aged white woman in no acute distress Vitals:   09/20/16 1035  BP: 123/65  Pulse: (!) 109  Resp: 18  Temp: 98.3 F (36.8 C)     Body mass index is 28.98 kg/m.    ECOG FS:1 - Symptomatic but completely ambulatory  Sclerae unicteric, EOMs intact Oropharynx clear and moist No cervical or supraclavicular adenopathy Lungs no rales or rhonchi Heart regular rate and rhythm Abd soft, nontender, positive bowel sounds MSK no focal spinal tenderness, no upper extremity lymphedema Neuro: nonfocal, well oriented, appropriate affect Breasts: I do not palpate a mass in the right breast. The right axilla is benign. The left breast is status post mastectomy with expander in place. There is no evidence of local recurrence. Left axilla  is benign.  LAB RESULTS:  CMP     Component Value Date/Time   NA 143 09/05/2016 1022   K 4.0 09/05/2016 1022   CL 110 02/15/2016 0330   CO2 26 09/05/2016 1022   GLUCOSE 120 09/05/2016 1022   BUN 16.8 09/05/2016 1022   CREATININE 1.1 09/05/2016 1022   CALCIUM 10.0 09/05/2016 1022   PROT 7.2 09/05/2016 1022   ALBUMIN 3.8 09/05/2016 1022   AST 23 09/05/2016 1022   ALT 21 09/05/2016 1022   ALKPHOS 65 09/05/2016 1022   BILITOT 0.35 09/05/2016 1022   GFRNONAA >60 02/15/2016 0330   GFRAA >60 02/15/2016 0330    INo results found for: SPEP, UPEP  Lab Results  Component Value Date   WBC 3.9 09/20/2016   NEUTROABS 2.4 09/20/2016   HGB 11.4 (L) 09/20/2016   HCT 33.2 (L) 09/20/2016   MCV 92.6 09/20/2016   PLT 433 (H) 09/20/2016      Chemistry      Component Value Date/Time   NA 143 09/05/2016 1022   K 4.0 09/05/2016 1022   CL 110 02/15/2016 0330   CO2 26 09/05/2016 1022   BUN 16.8 09/05/2016 1022   CREATININE 1.1 09/05/2016 1022      Component Value Date/Time   CALCIUM 10.0 09/05/2016 1022   ALKPHOS 65 09/05/2016 1022   AST 23 09/05/2016 1022   ALT 21 09/05/2016 1022   BILITOT 0.35 09/05/2016 1022       No results found for: LABCA2  No components found for: LABCA125  No results for input(s): INR in the last 168 hours.  Urinalysis    Component Value Date/Time   COLORURINE YELLOW 11/12/2015 [place] 11/12/2015 0837   LABSPEC 1.005 05/15/2016 1405   PHURINE 6.0 05/15/2016 1405   PHURINE 6.5 11/12/2015 0837   GLUCOSEU Negative 05/15/2016 1405   HGBUR Small 05/15/2016 1405   HGBUR MODERATE (A) 11/12/2015 0837   BILIRUBINUR Negative 05/15/2016 1405   KETONESUR Negative 05/15/2016 [place] 11/12/2015  [place] Negative 05/15/2016 [place] 11/12/2015 0837   UROBILINOGEN 0.2 05/15/2016 1405   NITRITE Negative 05/15/2016 1405   NITRITE NEGATIVE 11/12/2015 0837   LEUKOCYTESUR Moderate 05/15/2016 1405      STUDIES: Dg Bone Density  Result Date: 09/07/2016 EXAM: DUAL X-RAY ABSORPTIOMETRY (DXA) FOR BONE MINERAL DENSITY IMPRESSION: Referring Physician:  Holley Bouche PATIENT: Name: Alison [name], Alison [name] Patient ID: [id number] Birth Date: 1961-07-24 Height: 62.2 in. Sex: Female Measured: 09/07/2016 Weight: 156.4 lbs. Indications: Anastrazole, Bilateral Ovariectomy (65.51), Breast Cancer History, Caucasian, Estrogen Deficient, Hysterectomy, Low Calcium Intake (269.3), Postmenopausal Fractures: None Treatments: Hormone Therapy For Cancer, Vitamin D (E933.5) ASSESSMENT: The BMD measured at Femur Neck Left is 0.804 g/cm2 with a T-score of -1.7. This patient is considered osteopenic according to [facility]) criteria. Per the official positions of the ISCD, it is not possible to quantitatively compare BMD or calculate an [facility] between exams done at different facilities. Site Region Measured Date Measured Age YA BMD Significant CHANGE T-score DualFemur Neck Left 09/07/2016    56.7         -1.7    0.804 g/cm2 AP Spine  L1-L4     09[age]07/2016    56.7         -1.3    1.021 g/cm2 World Health Organization [facility]) criteria for post-menopausal, Caucasian Women: Normal       T-score at or above -1 SD Osteopenia   T-score between -1 and -2.5 SD Osteoporosis T-score at or below -2.5 SD RECOMMENDATION: [place] recommends that FDA-approved medical therapies be considered in postmenopausal women and men age 49 or older with a: 1. Hip or vertebral (clinical or morphometric) fracture. 2. T-score of <-2.5 at the spine or hip. 3. Ten-year fracture probability by FRAX of 3% or greater for hip fracture or 20% or greater for major osteoporotic fracture. All treatment decisions require clinical judgment and consideration of individual patient factors, including patient preferences, co-morbidities, previous drug use, risk factors not captured in the FRAX model (e.g. falls, vitamin D deficiency, increased  bone turnover, interval significant decline in bone density) and possible under - or over-estimation of fracture risk by FRAX. All patients should ensure an adequate intake of dietary calcium (1200 mg/d) and vitamin D (800 IU daily) unless contraindicated. FOLLOW-UP: People with diagnosed cases of osteoporosis or at high risk for fracture should have regular bone mineral density tests. For patients eligible for Medicare, routine testing is allowed once every 2 years. The testing frequency can be increased to one year for patients who have rapidly progressing disease, those who are receiving or discontinuing medical therapy to restore bone mass, or have additional risk factors. I have reviewed this report, and agree with the above findings. [facility] FRAX* 10-year Probability of Fracture Based on femoral neck BMD: DualFemur (Left) Major Osteoporotic Fracture: 7.5% Hip Fracture:                0.7% Population:                  [place] (Caucasian) Risk Factors:                None *FRAX is a Materials engineer of the [employer] of [employer] for Metabolic Bone Disease, a World Pharmacologist (WHO) [facility]. ASSESSMENT: The probability of a major osteoporotic fracture is 7.5 % within the next ten years. The probability of a hip fracture is 0.7 % within the next  ten years. Electronically Signed   By: Lajean Manes M.D.   On: 09/07/2016 10:12   PROTOCOLS: BWEL, ABC and PALLAS   ASSESSMENT: 56 y.o. Alison [name] woman status post biopsy of 2 separate masses in the left breast upper outer quadrant 08/04/2015, clinically mT2 N1, stage IIb/IIIa invasive ductal carcinoma, grade 3, the larger mass being estrogen and progesterone receptor positive, HER-2 negative, with an MIB-1 of 12%  (a) biopsy of a left axillary lymph node 08/09/2015 was positive, with extracapsular extension  (1) neoadjuvant chemotherapy started 08/25/2015 consisting of doxorubicin and cyclophosphamide in dose dense  fashion 4, completed 10/06/2015,  followed by weekly paclitaxel 10 completed 12/23/2015  (a) final 2 planned cycles of weekly paclitaxel omitted because of neuropathy concerns  (2) status post left mastectomy with targeted axillary dissection 02/02/2016 for a ypT2 ypN2a, stage IIIa invasive ductal carcinoma, grade 2, with negative margins, repeat prognostic panel showing estrogen receptor positive, but progesterone receptor and HER-2 negative  (a) completion axillary lymph node dissection 02/14/2016 showed an additional 2 out of 8 lymph nodes to be involved (final count 6 out of 10 lymph nodes positive  (b) expander placement at the time of left mastectomy  (3) adjuvant capecitabine starting concurrently with radiation--discontinued 05/21/2016  (4) adjuvant  radiation started 04/02/2016, completed 05/21/2016  (5) anastrozole started October 2017  (a) bone density August 2016 showed osteopenia  (b) repeat bone density 09/07/2016 shows a T score of -1.7.  (c) the patient is status post total abdominal hysterectomy with bilateral salpingo-oophorectomy.  (6) postoperative pain: Starting OxyContin 20 mg twice a day with oxycodone as needed for breakthrough pain 03/02/2016  (7) Research:  (a) B-WEL study ([facility] [id number])--enrolled in Arm 1 (received education)  (b) PALLAS AFT-05 study: randomized to treatment, started palbociclib 07/26/2016    PLAN: Alison [name] had some low counts with the initial dose of palbociclib so we are dropping the dose to 800 mg daily, 21 on and 7 off, beginning with the current cycle.  She also had some vague malaise symptoms which hopefully will not recur at the current dose.  She is tolerating the anastrozole well. She does have aches and pains here and there but they are not clearly related to that medication as far as I can tell.  She continues to have significant pain and significant insomnia problems. She is taking quite a bit of medication for this. Hopefully  over the next year some of these can be slowly peeled off.  We discussed her bone density in detail. She is a good candidate for his alendronate. We talked about the possible toxicities, side effects and complications of this agent. She will receive the first dose next week. The plan will be to do this every 6 months for 2 years at which point we will repeat a bone density.  I have made her lab work on a monthly basis to make sure we don't get terribly cytopenic from the [place]. She will see me again in April. She knows to call for any problems that may develop before that visit.  :Chauncey Cruel, MD   09/20/2016 11:07 AM

## 2016-09-21 ENCOUNTER — Other Ambulatory Visit: Payer: Self-pay | Admitting: Oncology

## 2016-09-23 ENCOUNTER — Encounter: Payer: Self-pay | Admitting: Oncology

## 2016-09-24 ENCOUNTER — Other Ambulatory Visit: Payer: Self-pay | Admitting: Oncology

## 2016-09-26 ENCOUNTER — Ambulatory Visit (HOSPITAL_BASED_OUTPATIENT_CLINIC_OR_DEPARTMENT_OTHER): Payer: 59

## 2016-09-26 ENCOUNTER — Other Ambulatory Visit (HOSPITAL_BASED_OUTPATIENT_CLINIC_OR_DEPARTMENT_OTHER): Payer: 59

## 2016-09-26 ENCOUNTER — Telehealth: Payer: Self-pay

## 2016-09-26 ENCOUNTER — Encounter: Payer: Self-pay | Admitting: *Deleted

## 2016-09-26 DIAGNOSIS — Z170 Estrogen receptor positive status [ER+]: Principal | ICD-10-CM

## 2016-09-26 DIAGNOSIS — C50412 Malignant neoplasm of upper-outer quadrant of left female breast: Secondary | ICD-10-CM | POA: Diagnosis not present

## 2016-09-26 DIAGNOSIS — C779 Secondary and unspecified malignant neoplasm of lymph node, unspecified: Principal | ICD-10-CM

## 2016-09-26 DIAGNOSIS — C50912 Malignant neoplasm of unspecified site of left female breast: Secondary | ICD-10-CM | POA: Diagnosis not present

## 2016-09-26 DIAGNOSIS — Z79811 Long term (current) use of aromatase inhibitors: Secondary | ICD-10-CM

## 2016-09-26 DIAGNOSIS — M8580 Other specified disorders of bone density and structure, unspecified site: Secondary | ICD-10-CM

## 2016-09-26 LAB — CBC WITH DIFFERENTIAL/PLATELET
BASO%: 3.3 % — AB (ref 0.0–2.0)
Basophils Absolute: 0.1 10*3/uL (ref 0.0–0.1)
EOS%: 5.4 % (ref 0.0–7.0)
Eosinophils Absolute: 0.2 10*3/uL (ref 0.0–0.5)
HEMATOCRIT: 33.3 % — AB (ref 34.8–46.6)
HGB: 11.5 g/dL — ABNORMAL LOW (ref 11.6–15.9)
LYMPH#: 0.7 10*3/uL — AB (ref 0.9–3.3)
LYMPH%: 22.9 % (ref 14.0–49.7)
MCH: 31.9 pg (ref 25.1–34.0)
MCHC: 34.5 g/dL (ref 31.5–36.0)
MCV: 92.5 fL (ref 79.5–101.0)
MONO#: 0.2 10*3/uL (ref 0.1–0.9)
MONO%: 4.8 % (ref 0.0–14.0)
NEUT%: 63.6 % (ref 38.4–76.8)
NEUTROS ABS: 2 10*3/uL (ref 1.5–6.5)
PLATELETS: 326 10*3/uL (ref 145–400)
RBC: 3.6 10*6/uL — AB (ref 3.70–5.45)
RDW: 17.4 % — ABNORMAL HIGH (ref 11.2–14.5)
WBC: 3.2 10*3/uL — AB (ref 3.9–10.3)

## 2016-09-26 LAB — COMPREHENSIVE METABOLIC PANEL
ALT: 14 U/L (ref 0–55)
ANION GAP: 10 meq/L (ref 3–11)
AST: 21 U/L (ref 5–34)
Albumin: 3.9 g/dL (ref 3.5–5.0)
Alkaline Phosphatase: 74 U/L (ref 40–150)
BILIRUBIN TOTAL: 0.36 mg/dL (ref 0.20–1.20)
BUN: 28.4 mg/dL — AB (ref 7.0–26.0)
CALCIUM: 9.8 mg/dL (ref 8.4–10.4)
CO2: 25 meq/L (ref 22–29)
CREATININE: 1.6 mg/dL — AB (ref 0.6–1.1)
Chloride: 105 mEq/L (ref 98–109)
EGFR: 35 mL/min/{1.73_m2} — ABNORMAL LOW (ref >90)
Glucose: 106 mg/dl (ref 70–140)
Potassium: 4.3 mEq/L (ref 3.5–5.1)
Sodium: 140 mEq/L (ref 136–145)
TOTAL PROTEIN: 7 g/dL (ref 6.4–8.3)

## 2016-09-26 MED ORDER — ZOLEDRONIC ACID 4 MG/100ML IV SOLN
4.0000 mg | Freq: Once | INTRAVENOUS | Status: AC
  Administered 2016-09-26: 4 mg via INTRAVENOUS
  Filled 2016-09-26: qty 100

## 2016-09-26 MED ORDER — SODIUM CHLORIDE 0.9 % IV SOLN
INTRAVENOUS | Status: DC
  Administered 2016-09-26: 11:00:00 via INTRAVENOUS

## 2016-09-26 NOTE — Patient Instructions (Signed)

## 2016-09-26 NOTE — Progress Notes (Signed)
09/26/16 at 12:15pm - Follow up visit/ monitor creatinine - The pt was into the cancer center this morning for her lab and infusion appointment.  The pt confirmed that she is drinking more water lately. The pt's creatinine value was 1.6 mg/dL which was down from last week.  The pt's creatinine value is now a grade 1.  The research nurse took the pt's labs to Dr. [name] to review.  He stated that he is comfortable with the pt continuing her current treatment with no dose interruptions or modifications.  He was informed that she has increased her water intake over the past week.  The pt's labs will be repeated on 10/17/16 prior to the pt's cycle 4 for continued monitoring. The pt was informed of her lab value, and she was instructed to continue taking her study drug as prescribed.  The pt verbalized understanding.   Brion Aliment RN, BSN, CCRP Clinical Research Nurse 09/26/2016 12:23 PM

## 2016-09-26 NOTE — Telephone Encounter (Signed)
Called and left a message with her new lab appt on 2/21 per [place].   Alison [name]

## 2016-09-27 ENCOUNTER — Telehealth: Payer: Self-pay

## 2016-09-27 ENCOUNTER — Ambulatory Visit: Payer: 59 | Admitting: Physical Therapy

## 2016-09-27 ENCOUNTER — Telehealth: Payer: Self-pay | Admitting: Oncology

## 2016-09-27 MED ORDER — ZOLPIDEM TARTRATE 5 MG PO TABS: 5.0000 mg | ORAL_TABLET | Freq: Every evening | ORAL | 2 refills | Status: DC | PRN

## 2016-09-27 NOTE — Telephone Encounter (Signed)
Faxed records to [facility] health [phone]

## 2016-09-27 NOTE — Telephone Encounter (Signed)
Pt had zometa yesterday and got flu like symptoms: bad headache, temp 100.1, nausea, mm cramps in arms and chest. She did take antinausea med this am that helped. Her headache is less but she is a little dizzy. Instructed her to take tylenol and pain and nausea med as needed. This should be short lived.  She is currently taking ibrance and her cbc yesterday was good.   Forwarded to Dr [name] for other possible instructions.  Ambien refilled per pt request.

## 2016-10-02 ENCOUNTER — Other Ambulatory Visit: Payer: Self-pay | Admitting: *Deleted

## 2016-10-02 DIAGNOSIS — C50412 Malignant neoplasm of upper-outer quadrant of left female breast: Secondary | ICD-10-CM

## 2016-10-02 MED ORDER — OXYCODONE HCL 5 MG PO TABS: 5.0000 mg | ORAL_TABLET | ORAL | 0 refills | Status: DC | PRN

## 2016-10-04 ENCOUNTER — Ambulatory Visit: Payer: 59 | Attending: General Surgery | Admitting: Physical Therapy

## 2016-10-04 DIAGNOSIS — M25512 Pain in left shoulder: Secondary | ICD-10-CM | POA: Insufficient documentation

## 2016-10-04 DIAGNOSIS — M25612 Stiffness of left shoulder, not elsewhere classified: Secondary | ICD-10-CM | POA: Diagnosis present

## 2016-10-04 DIAGNOSIS — G8929 Other chronic pain: Secondary | ICD-10-CM | POA: Diagnosis present

## 2016-10-04 DIAGNOSIS — I890 Lymphedema, not elsewhere classified: Secondary | ICD-10-CM | POA: Insufficient documentation

## 2016-10-04 DIAGNOSIS — R293 Abnormal posture: Secondary | ICD-10-CM | POA: Diagnosis not present

## 2016-10-04 NOTE — Therapy (Signed)
[place], [place], [address] Phone: [phone]   Fax:  [phone]  Physical Therapy Treatment  Patient Details  Name: Alison [name] MRN: [id number] Date of Birth: March 25, 1961 Referring Provider: [name]   Encounter Date: 10/04/2016      PT End of Session - 10/04/16 1822    Visit Number 17   Number of Visits 23   Date for PT Re-Evaluation 10/11/16   PT Start Time 1350   PT Stop Time 1430   PT Time Calculation (min) 40 min   Activity Tolerance Patient tolerated treatment well   Behavior During Therapy [facility] for tasks assessed/performed      Past Medical History:  Diagnosis Date  . Asthma     seasonal asthma  . Breast cancer ([place])   . Complication of anesthesia 2007   aspiration pneumonia post hysterectomy.  Blood pressure would drop low- but no problems 01/2016- surgery  . History of blood transfusion   . History of mitral valve prolapse 1988   with palpitations  . History of radiation therapy 04/02/16- 05/21/16   Left Chest Wall, SCV, Axilla, IM nodes  . Hypertension   . Neuropathy ([place])    with chemo  . Osteopenia determined by x-ray   . Pneumonia    1980's and 1990's  . Raynaud's disease   . 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 Skin cancer 03/12/2014   basal- back    Past Surgical History:  Procedure Laterality Date  . ABDOMINAL HYSTERECTOMY  2007   with BSO  . APPENDECTOMY  2007  . AXILLARY LYMPH NODE DISSECTION Left 02/14/2016   Procedure: LEFT AXILLARY LYMPH NODE DISSECTION;  Surgeon: [name], MD;  Location: [facility];  Service: General;  Laterality: Left;  . BREAST RECONSTRUCTION WITH PLACEMENT OF TISSUE EXPANDER AND FLEX HD (ACELLULAR HYDRATED DERMIS) Left 02/02/2016   Procedure: IMMEDIATE LEFT BREAST RECONSTRUCTION WITH PLACEMENT OF TISSUE EXPANDER AND FLEX HD (ACELLULAR HYDRATED DERMIS);  Surgeon: [name], DO;  Location: [facility];  Service: Plastics;  Laterality: Left;  . BREAST SURGERY Left 02/02/16   mastectomy with reconstruction  . c-section  1991  . COLON RESECTION SIGMOID  12/02/2012   [facility]   . HERNIA REPAIR  2007  . LAPAROSCOPIC ENDOMETRIOSIS FULGURATION  1984, 1989  . Port- a- cath placement  07/2015  . PORT-A-CATH REMOVAL Right 02/02/2016   Procedure: REMOVAL PORT-A-CATH;  Surgeon: [name], MD;  Location: [facility];  Service: General;  Laterality: Right;  . RADIOACTIVE SEED GUIDED AXILLARY SENTINEL LYMPH NODE Left 02/02/2016   Procedure: LEFT BREAST MASTECTOMY AND RADIOACTIVE SEED GUIDED LEFT SENTINEL LYMPH NODE BIOPSY ;  Surgeon: [name], MD;  Location: [facility];  Service: General;  Laterality: Left;    There were no vitals filed for this visit.      Subjective Assessment - 10/04/16 1820    Subjective Alison [name] has started [facility] and feels that the group will be very helpful to her.  She hops to get her compression garment on Monday.  She continues to use her Flexitouch and hasn't been able to do exercise. Overall, she feels better    Pertinent History Breast cancer diagnosed November2016 She had chemotherapy. but did have some neuropathy so it had to be stopped. On June 8, she had a mastectomy with immedicate expander placement and with first set of lymphnodes removed, On June 20 she went  for complete axillary lymph node dissection. She says she has had always been "full necked" but has neck and upper body fullness since the cancer was diagnosed. She reports she has osteopenia in low back and has had a fracture that was discovered in 2014 when she had a Dexa scan.   Patient Stated Goals to decrease the edema   Currently in Pain? No/denies               LYMPHEDEMA/ONCOLOGY QUESTIONNAIRE - 10/04/16 1410      Head and Neck   4 cm superior to sternal notch around neck 52.2 cm   6 cm superior to sternal notch around neck 51.7 cm   8 cm superior  to sternal notch around neck 52 cm                  [facility] Adult PT Treatment/Exercise - 10/04/16 0001      Manual Therapy   Manual Lymphatic Drainage (MLD) in supine short neck, superficial and deep abdominals, right axillary nodes, anterior interaxillary anastamosis, anterior and posterior neck and Lt inguinal nodes and Lt axillo-inguinal anastomosis briefly, then to right sidelying for posterior interaxillary anastamosis.                         [facility] Goals - 08/30/16 1801      CC Long Term Goal  #1   Title Patient will report a decrease in left shoulder pain by 75% so she can perform daily activities with greater ease   Time 6   Status On-going     CC Long Term Goal  #2   Title Reduce circumference measurement at 6 cm superior to sternal notch to 50 cm or less.   Baseline 54.4 at eval, 52 cm at 06/26/2016 . 51 cm on 07/25/2016, 52.5 at 08/30/2016   Time 6   Period Weeks   Status On-going     CC Long Term Goal  #3   Title Patient will be independent in self manual lymph drainage.   Status Achieved     CC Long Term Goal  #4   Title Patient will increase right shoulder flexion AROM to 140 degrees without any pain for improved ability to perform ADLs.   Baseline 145 on 10/31, 130 degrees 07/25/2016  limited by pain    Time 6   Period Weeks   Status On-going     CC Long Term Goal  #5   Title Patient will utilize chip pack for at least 2 hours per day at home to assess whether compression garment would be beneficial.   Baseline chip packs help, but fluid returns when chip pack is not used ; pt using chip pack, working on being more consistent while having it stay in place-08/01/16  pt states she is using chip pack    Status Achieved     CC Long Term Goal  #6   Title Pt will be independent in strenthening program    Baseline pt with too much pain with resistive exercise    Status Deferred            Plan - 10/04/16 1822    Clinical  Impression Statement Pt appears better today.  she has had slight reduction in measurements at neck, but continues to have swelling in left axilla and upper back.    Rehab Potential Fair   Clinical Impairments Affecting Rehab Potential chemotherapy with neuropathy    PT  Frequency 1x / week   PT Duration 6 weeks   PT Treatment/Interventions ADLs/Self Care Home Management;Manual lymph drainage;Compression bandaging;Scar mobilization;Passive range of motion;Patient/family education;Taping;Manual techniques;Therapeutic exercise;Therapeutic activities;Orthotic Fit/Training;DME Instruction   PT Next Visit Plan Remeasure neck circumference aftere wearing compression garment update HEP  Renew    Consulted and Agree with Plan of Care Patient      Patient will benefit from skilled therapeutic intervention in order to improve the following deficits and impairments:  Decreased range of motion, Impaired UE functional use, Decreased activity tolerance, Decreased knowledge of precautions, Decreased skin integrity, Impaired perceived functional ability, Pain, Decreased knowledge of use of DME, Increased edema, Postural dysfunction, Decreased strength, Impaired sensation, Decreased scar mobility  Visit Diagnosis: Abnormal posture  Lymphedema, not elsewhere classified  Left shoulder pain, unspecified chronicity  Stiffness of left shoulder joint     Problem List Patient Active Problem List   Diagnosis Date Noted  . Osteopenia determined by x-ray 09/20/2016  . Primary malignant neoplasm of left breast with stage 2 nodal metastasis per American Joint Committee on Cancer 7th edition (N2) ([place]) 02/14/2016  . Chemotherapy-induced neuropathy ([place]) 12/30/2015  . Insomnia 09/01/2015  . Malignant neoplasm of upper-outer quadrant of left breast in female, estrogen receptor positive ([place]) 08/08/2015   Donato Heinz. Owens Shark PT  Norwood Levo 10/04/2016, 6:24 PM  [place], [place], [address] Phone: [phone]   Fax:  [phone]  Name: Alison [name] MRN: [id number] Date of Birth: Jan 07, 1961

## 2016-10-08 ENCOUNTER — Telehealth: Payer: Self-pay | Admitting: *Deleted

## 2016-10-08 NOTE — Telephone Encounter (Signed)
10/08/2016 Spoke with patient by phone today in follow-up regarding lab results at last clinic visit. Per follow-up with Dr. Lindi Adie in Dr. Virgie Dad absence last week, no additional monitoring of renal function is needed, prior to next week's lab tests, but patient should be encouraged to continue to take in plenty of fluids. Explained this to patient who voiced understanding. Patient states that she has three doses of palbociclib remaining. She will continue dosing as prescribed, however, patient was instructed not to begin dosing Cycle 4 palbociclib next week until after she has been contacted by the clinic regarding her lab results, which will be done on Wednesday 10/17/2016. Patient confirmed that she understood these instructions. Cindy S. Brigitte Pulse BSN, RN, CCRP 10/08/2016 1:34 PM

## 2016-10-09 ENCOUNTER — Other Ambulatory Visit: Payer: Self-pay

## 2016-10-09 DIAGNOSIS — C50412 Malignant neoplasm of upper-outer quadrant of left female breast: Secondary | ICD-10-CM

## 2016-10-09 MED ORDER — OXYCODONE HCL ER 20 MG PO T12A: 40.0000 mg | EXTENDED_RELEASE_TABLET | Freq: Two times a day (BID) | ORAL | 0 refills | Status: DC

## 2016-10-09 NOTE — Telephone Encounter (Signed)
Refill ready 

## 2016-10-11 ENCOUNTER — Ambulatory Visit: Payer: 59 | Admitting: Physical Therapy

## 2016-10-11 DIAGNOSIS — I890 Lymphedema, not elsewhere classified: Secondary | ICD-10-CM

## 2016-10-11 DIAGNOSIS — R293 Abnormal posture: Secondary | ICD-10-CM | POA: Diagnosis not present

## 2016-10-11 DIAGNOSIS — M25612 Stiffness of left shoulder, not elsewhere classified: Secondary | ICD-10-CM

## 2016-10-11 DIAGNOSIS — M25512 Pain in left shoulder: Secondary | ICD-10-CM

## 2016-10-11 NOTE — Therapy (Signed)
[place], [place], [address] Phone: [phone]   Fax:  [phone]  Physical Therapy Treatment  Patient Details  Name: Alison [name] MRN: [id number] Date of Birth: 1960/11/23 Referring Provider: [name]   Encounter Date: 10/11/2016      PT End of Session - 10/11/16 1453    Visit Number 18   Number of Visits 23   Date for PT Re-Evaluation 10/11/16   PT Start Time 1301   PT Stop Time 1345   PT Time Calculation (min) 44 min   Activity Tolerance Patient tolerated treatment well   Behavior During Therapy [facility] for tasks assessed/performed      Past Medical History:  Diagnosis Date  . Asthma     seasonal asthma  . Breast cancer ([place])   . Complication of anesthesia 2007   aspiration pneumonia post hysterectomy.  Blood pressure would drop low- but no problems 01/2016- surgery  . History of blood transfusion   . History of mitral valve prolapse 1988   with palpitations  . History of radiation therapy 04/02/16- 05/21/16   Left Chest Wall, SCV, Axilla, IM nodes  . Hypertension   . Neuropathy ([place])    with chemo  . Osteopenia determined by x-ray   . Pneumonia    1980's and 1990's  . Raynaud's disease   . 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 Skin cancer 03/12/2014   basal- back    Past Surgical History:  Procedure Laterality Date  . ABDOMINAL HYSTERECTOMY  2007   with BSO  . APPENDECTOMY  2007  . AXILLARY LYMPH NODE DISSECTION Left 02/14/2016   Procedure: LEFT AXILLARY LYMPH NODE DISSECTION;  Surgeon: [name], MD;  Location: [facility];  Service: General;  Laterality: Left;  . BREAST RECONSTRUCTION WITH PLACEMENT OF TISSUE EXPANDER AND FLEX HD (ACELLULAR HYDRATED DERMIS) Left 02/02/2016   Procedure: IMMEDIATE LEFT BREAST RECONSTRUCTION WITH PLACEMENT OF TISSUE EXPANDER AND FLEX HD (ACELLULAR HYDRATED DERMIS);  Surgeon: [name], DO;  Location:  [facility];  Service: Plastics;  Laterality: Left;  . BREAST SURGERY Left 02/02/16   mastectomy with reconstruction  . c-section  1991  . COLON RESECTION SIGMOID  12/02/2012   [facility]   . HERNIA REPAIR  2007  . LAPAROSCOPIC ENDOMETRIOSIS FULGURATION  1984, 1989  . Port- a- cath placement  07/2015  . PORT-A-CATH REMOVAL Right 02/02/2016   Procedure: REMOVAL PORT-A-CATH;  Surgeon: [name], MD;  Location: [facility];  Service: General;  Laterality: Right;  . RADIOACTIVE SEED GUIDED AXILLARY SENTINEL LYMPH NODE Left 02/02/2016   Procedure: LEFT BREAST MASTECTOMY AND RADIOACTIVE SEED GUIDED LEFT SENTINEL LYMPH NODE BIOPSY ;  Surgeon: [name], MD;  Location: [facility];  Service: General;  Laterality: Left;    There were no vitals filed for this visit.                       [facility] Adult PT Treatment/Exercise - 10/11/16 0001      Self-Care   Other Self-Care Comments  Jovi pak head and neck gament appears to fit well.  Added a piece of large dotted peach foam to try to get more compression to submental and submandibular area.      Manual Therapy   Manual Lymphatic Drainage (MLD) in supine short neck, superficial and deep abdominals, right axillary nodes, anterior interaxillary anastamosis, anterior and posterior neck and  Lt inguinal nodes and Lt axillo-inguinal anastomosis briefly, then to right sidelying for posterior interaxillary anastamosis.                         [facility] Goals - 08/30/16 1801      CC Long Term Goal  #1   Title Patient will report a decrease in left shoulder pain by 75% so she can perform daily activities with greater ease   Time 6   Status On-going     CC Long Term Goal  #2   Title Reduce circumference measurement at 6 cm superior to sternal notch to 50 cm or less.   Baseline 54.4 at eval, 52 cm at 06/26/2016 . 51 cm on 07/25/2016, 52.5 at 08/30/2016   Time 6   Period Weeks    Status On-going     CC Long Term Goal  #3   Title Patient will be independent in self manual lymph drainage.   Status Achieved     CC Long Term Goal  #4   Title Patient will increase right shoulder flexion AROM to 140 degrees without any pain for improved ability to perform ADLs.   Baseline 145 on 10/31, 130 degrees 07/25/2016  limited by pain    Time 6   Period Weeks   Status On-going     CC Long Term Goal  #5   Title Patient will utilize chip pack for at least 2 hours per day at home to assess whether compression garment would be beneficial.   Baseline chip packs help, but fluid returns when chip pack is not used ; pt using chip pack, working on being more consistent while having it stay in place-08/01/16  pt states she is using chip pack    Status Achieved     CC Long Term Goal  #6   Title Pt will be independent in strenthening program    Baseline pt with too much pain with resistive exercise    Status Deferred            Plan - 10/11/16 1453    Clinical Impression Statement Pt continues to have edema at neck and upper back and left axilla with decreased range of motion and pain.  she realizes it will be a long recovery, but seems to be making slow steady gains in overall function and health.    Rehab Potential Fair   Clinical Impairments Affecting Rehab Potential chemotherapy with neuropathy    PT Duration 6 weeks   PT Treatment/Interventions ADLs/Self Care Home Management;Manual lymph drainage;Compression bandaging;Scar mobilization;Passive range of motion;Patient/family education;Taping;Manual techniques;Therapeutic exercise;Therapeutic activities;Orthotic Fit/Training;DME Instruction   PT Next Visit Plan Remeasure neck circumference aftere wearing compression garment update HEP  Renew    PT Home Exercise Plan wear compression T-shirt in conjunction with chip pack on neck for longer periods; scapular series in standing with yellow theraband   Consulted and Agree with Plan of  Care Patient      Patient will benefit from skilled therapeutic intervention in order to improve the following deficits and impairments:  Decreased range of motion, Impaired UE functional use, Decreased activity tolerance, Decreased knowledge of precautions, Decreased skin integrity, Impaired perceived functional ability, Pain, Decreased knowledge of use of DME, Increased edema, Postural dysfunction, Decreased strength, Impaired sensation, Decreased scar mobility  Visit Diagnosis: Abnormal posture  Left shoulder pain, unspecified chronicity  Lymphedema, not elsewhere classified  Stiffness of left shoulder joint     Problem List  Patient Active Problem List   Diagnosis Date Noted  . Osteopenia determined by x-ray 09/20/2016  . Primary malignant neoplasm of left breast with stage 2 nodal metastasis per American Joint Committee on Cancer 7th edition (N2) ([place]) 02/14/2016  . 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 Insomnia 09/01/2015  . Malignant neoplasm of upper-outer quadrant of left breast in female, estrogen receptor positive ([place]) 08/08/2015   Donato Heinz. Owens Shark PT  Norwood Levo 10/11/2016, 2:55 PM  [place], [place], [address] Phone: [phone]   Fax:  [phone]  Name: YEVA OBARR MRN: [id number] Date of Birth: 1961/04/01

## 2016-10-17 ENCOUNTER — Encounter: Payer: Self-pay | Admitting: *Deleted

## 2016-10-17 ENCOUNTER — Other Ambulatory Visit (HOSPITAL_BASED_OUTPATIENT_CLINIC_OR_DEPARTMENT_OTHER): Payer: 59

## 2016-10-17 ENCOUNTER — Other Ambulatory Visit: Payer: Self-pay | Admitting: *Deleted

## 2016-10-17 DIAGNOSIS — C50412 Malignant neoplasm of upper-outer quadrant of left female breast: Secondary | ICD-10-CM

## 2016-10-17 DIAGNOSIS — C50912 Malignant neoplasm of unspecified site of left female breast: Secondary | ICD-10-CM

## 2016-10-17 DIAGNOSIS — Z170 Estrogen receptor positive status [ER+]: Secondary | ICD-10-CM

## 2016-10-17 DIAGNOSIS — C779 Secondary and unspecified malignant neoplasm of lymph node, unspecified: Principal | ICD-10-CM

## 2016-10-17 LAB — COMPREHENSIVE METABOLIC PANEL
ALBUMIN: 3.9 g/dL (ref 3.5–5.0)
ALK PHOS: 79 U/L (ref 40–150)
ALT: 15 U/L (ref 0–55)
AST: 19 U/L (ref 5–34)
Anion Gap: 6 mEq/L (ref 3–11)
BUN: 16.4 mg/dL (ref 7.0–26.0)
CALCIUM: 9.4 mg/dL (ref 8.4–10.4)
CO2: 24 mEq/L (ref 22–29)
CREATININE: 0.9 mg/dL (ref 0.6–1.1)
Chloride: 107 mEq/L (ref 98–109)
EGFR: 67 mL/min/{1.73_m2} — ABNORMAL LOW (ref >90)
Glucose: 89 mg/dl (ref 70–140)
Potassium: 4.9 mEq/L (ref 3.5–5.1)
Sodium: 137 mEq/L (ref 136–145)
TOTAL PROTEIN: 6.9 g/dL (ref 6.4–8.3)
Total Bilirubin: 0.39 mg/dL (ref 0.20–1.20)

## 2016-10-17 LAB — CBC WITH DIFFERENTIAL/PLATELET
BASO%: 4.4 % — AB (ref 0.0–2.0)
BASOS ABS: 0.1 10*3/uL (ref 0.0–0.1)
EOS%: 2.2 % (ref 0.0–7.0)
Eosinophils Absolute: 0 10*3/uL (ref 0.0–0.5)
HEMATOCRIT: 31.4 % — AB (ref 34.8–46.6)
HGB: 10.8 g/dL — ABNORMAL LOW (ref 11.6–15.9)
LYMPH#: 1 10*3/uL (ref 0.9–3.3)
LYMPH%: 45 % (ref 14.0–49.7)
MCH: 33.1 pg (ref 25.1–34.0)
MCHC: 34.5 g/dL (ref 31.5–36.0)
MCV: 95.9 fL (ref 79.5–101.0)
MONO#: 0.4 10*3/uL (ref 0.1–0.9)
MONO%: 17 % — ABNORMAL HIGH (ref 0.0–14.0)
NEUT#: 0.7 10*3/uL — ABNORMAL LOW (ref 1.5–6.5)
NEUT%: 31.4 % — ABNORMAL LOW (ref 38.4–76.8)
Platelets: 255 10*3/uL (ref 145–400)
RBC: 3.28 10*6/uL — ABNORMAL LOW (ref 3.70–5.45)
RDW: 18.5 % — AB (ref 11.2–14.5)
WBC: 2.2 10*3/uL — ABNORMAL LOW (ref 3.9–10.3)

## 2016-10-17 NOTE — Progress Notes (Signed)
10/17/16 at 3:33pm - Research note - [facility] pre-cycle 4 assessments- The pt was into the cancer center this morning for her pre-cycle 4 labs.  The pt's cbc/diff was drawn, but her chemistry panel was not drawn.  The pt was contacted by the lab and asked to return to the clinic so that her chemistry panel could be drawn and checked.  The pt agreed to return to the clinic.  The research nurse reviewed the pt's cbc/diff, and unfortunately the pt's ANC was 0.7 (grade 3 neutrophil count decreased).  The research nurse met with the pt after her chemistry panel was drawn.  The pt informed the nurse that she has had "severe fatigue" recently.  The research nurse explained to the pt that she should not start her study drug tomorrow due to her low ANC level.  The pt verbalized understanding. The research nurse explained that this is her 3rd appearance of a grade 3 neutrophil count decreased.  The pt was told that she will need to be re-evaluated in a week to see if her labs meet parameters to start cycle 4.  The pt was told that she will need to be further dose reduced to 75 mg.  The pt verbalized understanding.  The research nurse received the pt's chemistry panel results later in the day, and the pt's creatinine level was WNL.  The research nurse discussed the pt's labs with Dr. [name].  He was informed that she was told to not start her study drug tomorrow.  He was also informed of the pt's reported "severe fatigue".  He stated that the low blood counts as well as her severe fatigue is related to her study drug.  He was told that she will be due for a further dose reduction when she is deemed ready to begin cycle 4.  The research nurse then called the pt with her chemistry panel results.  The pt was delighted to hear that her creatinine was in the normal range.  The pt was told that Dr. [name] was made aware of the lab results.  The pt was told that Tyrell Antonio, research nurse, will contact her tomorrow with her further  treatment plans.  The pt thanked the nurse for keeping her informed today regarding her lab results.   Brion Aliment RN, BSN, CCRP Clinical Research Nurse 10/17/2016 3:53 PM

## 2016-10-18 ENCOUNTER — Ambulatory Visit: Payer: 59 | Admitting: Physical Therapy

## 2016-10-18 DIAGNOSIS — M25612 Stiffness of left shoulder, not elsewhere classified: Secondary | ICD-10-CM

## 2016-10-18 DIAGNOSIS — M25512 Pain in left shoulder: Secondary | ICD-10-CM

## 2016-10-18 DIAGNOSIS — G8929 Other chronic pain: Secondary | ICD-10-CM

## 2016-10-18 DIAGNOSIS — I890 Lymphedema, not elsewhere classified: Secondary | ICD-10-CM

## 2016-10-18 DIAGNOSIS — R293 Abnormal posture: Secondary | ICD-10-CM

## 2016-10-18 NOTE — Therapy (Signed)
[place], [place], [address] Phone: [phone]   Fax:  [phone]  Physical Therapy Treatment  Patient Details  Name: Alison [name] MRN: [id number] Date of Birth: [birth year] Referring Provider: [name]   Encounter Date: 10/18/2016      PT End of Session - 10/18/16 1403    Visit Number 19   Number of Visits 31   Date for PT Re-Evaluation 12/20/16   PT Start Time 1302   PT Stop Time 1355   PT Time Calculation (min) 53 min   Activity Tolerance Patient tolerated treatment well   Behavior During Therapy [facility] for tasks assessed/performed      Past Medical History:  Diagnosis Date  . Asthma     seasonal asthma  . Breast cancer ([place])   . Complication of anesthesia 2007   aspiration pneumonia post hysterectomy.  Blood pressure would drop low- but no problems 01/2016- surgery  . History of blood transfusion   . History of mitral valve prolapse 1988   with palpitations  . History of radiation therapy 04/02/16- 05/21/16   Left Chest Wall, SCV, Axilla, IM nodes  . Hypertension   . Neuropathy ([place])    with chemo  . Osteopenia determined by x-ray   . Pneumonia    1980's and 1990's  . Raynaud's disease   . 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 Skin cancer 03/12/2014   basal- back    Past Surgical History:  Procedure Laterality Date  . ABDOMINAL HYSTERECTOMY  2007   with BSO  . APPENDECTOMY  2007  . AXILLARY LYMPH NODE DISSECTION Left 02/14/2016   Procedure: LEFT AXILLARY LYMPH NODE DISSECTION;  Surgeon: [name], MD;  Location: [place];  Service: General;  Laterality: Left;  . BREAST RECONSTRUCTION WITH PLACEMENT OF TISSUE EXPANDER AND FLEX HD (ACELLULAR HYDRATED DERMIS) Left 02/02/2016   Procedure: IMMEDIATE LEFT BREAST RECONSTRUCTION WITH PLACEMENT OF TISSUE EXPANDER AND FLEX HD (ACELLULAR HYDRATED DERMIS);  Surgeon: [name], DO;  Location:  [place];  Service: Plastics;  Laterality: Left;  . BREAST SURGERY Left 02/02/16   mastectomy with reconstruction  . c-section  1991  . COLON RESECTION SIGMOID  12/02/2012   [facility]   . HERNIA REPAIR  2007  . LAPAROSCOPIC ENDOMETRIOSIS FULGURATION  1984, 1989  . Port- a- cath placement  07/2015  . PORT-A-CATH REMOVAL Right 02/02/2016   Procedure: REMOVAL PORT-A-CATH;  Surgeon: [name], MD;  Location: [facility];  Service: General;  Laterality: Right;  . RADIOACTIVE SEED GUIDED AXILLARY SENTINEL LYMPH NODE Left 02/02/2016   Procedure: LEFT BREAST MASTECTOMY AND RADIOACTIVE SEED GUIDED LEFT SENTINEL LYMPH NODE BIOPSY ;  Surgeon: [name], MD;  Location: [place];  Service: General;  Laterality: Left;    There were no vitals filed for this visit.      Subjective Assessment - 10/18/16 1312    Subjective Pt reports she has been very tired recently and had blood work drawn yesterday.  She has decreased WBC's and was told her fatigue is from that which is due to the study drug. She has been wearing her neck garment with the extra peach foam and has been using the Felxitouch about 5/times a week.  She had been able to exericse before the fatigue started.  She is having pain in her left axiila and lateral trunk and also into right arm.  She has been trying  to cut down on her pain medicine but had to take and oxy today.    Pertinent History Breast cancer diagnosed November2016 She had chemotherapy. but did have some neuropathy so it had to be stopped. On June 8, she had a mastectomy with immedicate expander placement and with first set of lymphnodes removed, On June 20 she went for complete axillary lymph node dissection. She says she has had always been "full necked" but has neck and upper body fullness since the cancer was diagnosed. She reports she has osteopenia in low back and has had a fracture that was discovered in 2014 when she had a Dexa scan.    Patient Stated Goals to decrease the edema   Currently in Pain? Yes   Pain Score 7    Pain Location Axilla   Pain Orientation Left   Pain Descriptors / Indicators Sharp   Pain Type Acute pain;Chronic pain   Pain Onset More than a month ago   Pain Frequency Constant   Aggravating Factors  can't telll what makes it worse    Pain Relieving Factors pain meds . physical therapy helps             [facility] PT Assessment - 10/18/16 0001      Assessment   Medical Diagnosis breast cancer   Referring Provider [name]    Onset Date/Surgical Date 07/03/15   Hand Dominance Right           LYMPHEDEMA/ONCOLOGY QUESTIONNAIRE - 10/18/16 1348      Head and Neck   4 cm superior to sternal notch around neck 53.5 cm   6 cm superior to sternal notch around neck 51.5 cm   8 cm superior to sternal notch around neck 51 cm                  [facility] Adult PT Treatment/Exercise - 10/18/16 0001      Manual Therapy   Myofascial Release prolonged pressue to tender trigger points at left posterior axilla    Manual Lymphatic Drainage (MLD) in supine short neck, superficial and deep abdominals, right axillary nodes, anterior interaxillary anastamosis, anterior and posterior neck and Lt inguinal nodes and Lt axillo-inguinal anastomosis briefly, then to right sidelying for posterior interaxillary anastamosis.    Passive ROM to left shoulder as tolerated    Kinesiotex Edema     Kinesiotix   Edema skinkote and kinsesiotap in fan shape along left lateral trunk and axilla                         Long Term Clinic Goals - 10/18/16 1319      CC Long Term Goal  #1   Title Patient will report a decrease in left shoulder pain by 75% so she can perform daily activities with greater ease   Baseline Pain had gotten better, but has increased pain and swelling    Time 8   Period Weeks   Status On-going     CC Long Term Goal  #2   Title Reduce circumference measurement at 6 cm  superior to sternal notch to 50 cm or less.   Baseline 54.4 at eval, 52 cm at 06/26/2016 . 51 cm on 07/25/2016, 52.5 at 08/30/2016, 51.5 on 10/16/2016   Time 8   Period Weeks   Status On-going     CC Long Term Goal  #3   Title Patient will be independent in self manual lymph drainage.  Baseline pt is now using the Flexitouch    Status Achieved     CC Long Term Goal  #4   Title Patient will increase right shoulder flexion AROM to 140 degrees without any pain for improved ability to perform ADLs.   Baseline 145 on 10/31, 130 degrees 07/25/2016  limited by pain    Status Achieved     CC Long Term Goal  #5   Title Patient will utilize chip pack for at least 2 hours per day at home to assess whether compression garment would be beneficial.   Baseline Pt now has the Jovipak compression garment    Status Achieved     CC Long Term Goal  #6   Title Pt will be independent in strenthening program    Baseline pt with too much pain with resistive exercise , but hopes to progress    Time 8   Period Weeks   Status On-going            Plan - 10/18/16 1404    Clinical Impression Statement Pt continues to have difficulty with pain, swelling and fatigue.  She has been trying to manage her swelling with a head and neck Flexitouch and JoviPak garment with reinforcement at neck and has had some modest reduction in neck circumference.  Her biggest compaint in swelling and pain in left axilla. Sent certification for 8 more weeks of therapy but pt is trying to decrease to once every other week and hopefully will be able to manage her pain on her own at home.    Rehab Potential Fair   Clinical Impairments Affecting Rehab Potential chemotherapy with neuropathy    PT Frequency 1x / week   PT Duration 8 weeks   PT Treatment/Interventions ADLs/Self Care Home Management;Manual lymph drainage;Compression bandaging;Scar mobilization;Passive range of motion;Patient/family education;Taping;Manual  techniques;Therapeutic exercise;Therapeutic activities;Orthotic Fit/Training;DME Instruction   PT Next Visit Plan manua lymph drainage and manual techniques to manage pain update HEP  with progression as able     Consulted and Agree with Plan of Care Patient      Patient will benefit from skilled therapeutic intervention in order to improve the following deficits and impairments:  Decreased range of motion, Impaired UE functional use, Decreased activity tolerance, Decreased knowledge of precautions, Decreased skin integrity, Impaired perceived functional ability, Pain, Decreased knowledge of use of DME, Increased edema, Postural dysfunction, Decreased strength, Impaired sensation, Decreased scar mobility  Visit Diagnosis: Abnormal posture - Plan: PT plan of care cert/re-cert  Left shoulder pain, unspecified chronicity - Plan: PT plan of care cert/re-cert  Lymphedema, not elsewhere classified - Plan: PT plan of care cert/re-cert  Stiffness of left shoulder joint - Plan: PT plan of care cert/re-cert  Chronic left shoulder pain - Plan: PT plan of care cert/re-cert     Problem List Patient Active Problem List   Diagnosis Date Noted  . Osteopenia determined by x-ray 09/20/2016  . Primary malignant neoplasm of left breast with stage 2 nodal metastasis per American Joint Committee on Cancer 7th edition (N2) ([place]) 02/14/2016  . Chemotherapy-induced neuropathy ([place]) 12/30/2015  . Insomnia 09/01/2015  . Malignant neoplasm of upper-outer quadrant of left breast in female, estrogen receptor positive ([place]) 08/08/2015   Donato Heinz. Owens Shark PT  Norwood Levo 10/18/2016, 2:14 PM  [place], [place], [address] Phone: [phone]   Fax:  [phone]  Name: Alison [name] MRN: [id number] Date of Birth: 12-Feb-1961

## 2016-10-19 ENCOUNTER — Other Ambulatory Visit: Payer: 59

## 2016-10-19 ENCOUNTER — Other Ambulatory Visit: Payer: Self-pay | Admitting: Oncology

## 2016-10-22 ENCOUNTER — Other Ambulatory Visit: Payer: Self-pay | Admitting: *Deleted

## 2016-10-22 ENCOUNTER — Telehealth: Payer: Self-pay

## 2016-10-22 NOTE — Telephone Encounter (Signed)
Called and left a message with new 2/28 lab and appt with Tyrell Antonio per inbasket message   Alison [name]

## 2016-10-23 ENCOUNTER — Telehealth: Payer: Self-pay | Admitting: Oncology

## 2016-10-23 NOTE — Telephone Encounter (Signed)
Faxed the most recent office note to [facility] health [phone]

## 2016-10-24 ENCOUNTER — Encounter: Payer: 59 | Admitting: *Deleted

## 2016-10-24 ENCOUNTER — Other Ambulatory Visit (HOSPITAL_BASED_OUTPATIENT_CLINIC_OR_DEPARTMENT_OTHER): Payer: 59

## 2016-10-24 ENCOUNTER — Encounter: Payer: Self-pay | Admitting: Oncology

## 2016-10-24 ENCOUNTER — Other Ambulatory Visit: Payer: Self-pay | Admitting: Oncology

## 2016-10-24 DIAGNOSIS — C50412 Malignant neoplasm of upper-outer quadrant of left female breast: Secondary | ICD-10-CM | POA: Diagnosis not present

## 2016-10-24 DIAGNOSIS — Z170 Estrogen receptor positive status [ER+]: Principal | ICD-10-CM

## 2016-10-24 DIAGNOSIS — C779 Secondary and unspecified malignant neoplasm of lymph node, unspecified: Principal | ICD-10-CM

## 2016-10-24 DIAGNOSIS — C50912 Malignant neoplasm of unspecified site of left female breast: Secondary | ICD-10-CM

## 2016-10-24 DIAGNOSIS — Z006 Encounter for examination for normal comparison and control in clinical research program: Secondary | ICD-10-CM

## 2016-10-24 LAB — CBC WITH DIFFERENTIAL/PLATELET
BASO%: 4.6 % — ABNORMAL HIGH (ref 0.0–2.0)
BASOS ABS: 0.2 10*3/uL — AB (ref 0.0–0.1)
EOS ABS: 0 10*3/uL (ref 0.0–0.5)
EOS%: 1.2 % (ref 0.0–7.0)
HEMATOCRIT: 35.4 % (ref 34.8–46.6)
HEMOGLOBIN: 12.1 g/dL (ref 11.6–15.9)
LYMPH#: 0.9 10*3/uL (ref 0.9–3.3)
LYMPH%: 26.7 % (ref 14.0–49.7)
MCH: 32.3 pg (ref 25.1–34.0)
MCHC: 34.2 g/dL (ref 31.5–36.0)
MCV: 94.4 fL (ref 79.5–101.0)
MONO#: 0.4 10*3/uL (ref 0.1–0.9)
MONO%: 11.2 % (ref 0.0–14.0)
NEUT#: 1.9 10*3/uL (ref 1.5–6.5)
NEUT%: 56.3 % (ref 38.4–76.8)
Platelets: 270 10*3/uL (ref 145–400)
RBC: 3.75 10*6/uL (ref 3.70–5.45)
RDW: 16.8 % — AB (ref 11.2–14.5)
WBC: 3.3 10*3/uL — ABNORMAL LOW (ref 3.9–10.3)

## 2016-10-24 NOTE — Progress Notes (Signed)
10/24/2016 Patient in to clinic today for evaluation prior to initiating Cycle 4 treatment, delayed from last week due to grade 3 ANC. Patient reports substantial improvement in her previously reported fatigue, which is relieved with rest periods throughout the day (grade 1). Based on lab results review and history, Dr. [name] felt that patient's condition was acceptable for continued treatment today, with dose reduction to 75mg  daily, Days 1-21. Cycle 3 bottle was returned to pharmacy today with zero capsules remaining. Study medication bottles [id number] and [id number] were dispensed today along with medication diaries to record doses. Patient will begin cycle 4 dosing tomorrow morning. She is to return pill bottles and all diaries at her next visit. Patient is aware that subsequent appointments will need to be moved to accommodate this delay in cycle start. A copy of the updated appointments will be mailed to her. Cindy S. Brigitte Pulse BSN, RN, [facility] 10/24/2016 4:57 PM

## 2016-10-25 ENCOUNTER — Telehealth: Payer: Self-pay

## 2016-10-25 ENCOUNTER — Other Ambulatory Visit: Payer: Self-pay | Admitting: Oncology

## 2016-10-25 MED ORDER — INV-PALBOCICLIB 75 MG CAPS #23 ALLIANCE FOUNDATION AFT-05 (PALLAS): 75.0000 mg | ORAL_CAPSULE | Freq: Every day | ORAL | 0 refills | Status: DC

## 2016-10-25 NOTE — Telephone Encounter (Signed)
New appointments made per Alison [name].  New schedule/calendars have been mailed to the patient.

## 2016-10-30 ENCOUNTER — Telehealth: Payer: Self-pay | Admitting: *Deleted

## 2016-10-30 NOTE — Telephone Encounter (Signed)
10/30/2016 Received telephone call from patient inquiring about her medication diaries for the PALLAS study. Patient to continue completing diaries (anastrozole and palbociclib) for the cycle beginning 10/25/2016. Will review diaries and medication supply when patient returns for next visit. Patient expressed need for afternoon appointments if at all possible. Patient is aware that April appointment needs to be in the morning due to MD appointment availability, however, will work to get other appointments rearranged accordingly. In the meantime, patient is aware that she will be receiving a mailing with appointments that were scheduled following last week's changes, however an updated mailing will be sent shortly. Cindy S. Brigitte Pulse BSN, RN, [place] 10/30/2016 2:27 PM

## 2016-10-31 ENCOUNTER — Telehealth: Payer: Self-pay

## 2016-10-31 ENCOUNTER — Other Ambulatory Visit: Payer: Self-pay | Admitting: Oncology

## 2016-10-31 NOTE — Telephone Encounter (Signed)
Spoke with patient per 3/6 phone call/message with [name] and her appointments have been moved per availability.   Alison [name]

## 2016-11-01 ENCOUNTER — Ambulatory Visit: Payer: 59 | Attending: General Surgery | Admitting: Physical Therapy

## 2016-11-01 DIAGNOSIS — I890 Lymphedema, not elsewhere classified: Secondary | ICD-10-CM | POA: Insufficient documentation

## 2016-11-01 DIAGNOSIS — M25612 Stiffness of left shoulder, not elsewhere classified: Secondary | ICD-10-CM | POA: Diagnosis present

## 2016-11-01 DIAGNOSIS — R293 Abnormal posture: Secondary | ICD-10-CM | POA: Insufficient documentation

## 2016-11-01 DIAGNOSIS — M25512 Pain in left shoulder: Secondary | ICD-10-CM | POA: Diagnosis present

## 2016-11-01 DIAGNOSIS — G8929 Other chronic pain: Secondary | ICD-10-CM | POA: Diagnosis present

## 2016-11-01 NOTE — Therapy (Signed)
[place], [place], [address] Phone: [phone]   Fax:  [phone]  Physical Therapy Treatment  Patient Details  Name: Alison [name] MRN: [id number] Date of Birth: [birth year] Referring Provider: [name]   Encounter Date: 11/01/2016      PT End of Session - 11/01/16 1735    Visit Number 20   Number of Visits 31   Date for PT Re-Evaluation 12/20/16   PT Start Time 3810   PT Stop Time 1605   PT Time Calculation (min) 48 min   Activity Tolerance Patient tolerated treatment well   Behavior During Therapy [facility] for tasks assessed/performed      Past Medical History:  Diagnosis Date  . Asthma     seasonal asthma  . Breast cancer ([place])   . Complication of anesthesia 2007   aspiration pneumonia post hysterectomy.  Blood pressure would drop low- but no problems 01/2016- surgery  . History of blood transfusion   . History of mitral valve prolapse 1988   with palpitations  . History of radiation therapy 04/02/16- 05/21/16   Left Chest Wall, SCV, Axilla, IM nodes  . Hypertension   . Neuropathy ([place])    with chemo  . Osteopenia determined by x-ray   . Pneumonia    1980's and 1990's  . Raynaud's disease   . 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 Skin cancer 03/12/2014   basal- back    Past Surgical History:  Procedure Laterality Date  . ABDOMINAL HYSTERECTOMY  2007   with BSO  . APPENDECTOMY  2007  . AXILLARY LYMPH NODE DISSECTION Left 02/14/2016   Procedure: LEFT AXILLARY LYMPH NODE DISSECTION;  Surgeon: [name], MD;  Location: [place];  Service: General;  Laterality: Left;  . BREAST RECONSTRUCTION WITH PLACEMENT OF TISSUE EXPANDER AND FLEX HD (ACELLULAR HYDRATED DERMIS) Left 02/02/2016   Procedure: IMMEDIATE LEFT BREAST RECONSTRUCTION WITH PLACEMENT OF TISSUE EXPANDER AND FLEX HD (ACELLULAR HYDRATED DERMIS);  Surgeon: [name], DO;  Location: [place];  Service: Plastics;  Laterality: Left;  . BREAST SURGERY Left 02/02/16   mastectomy with reconstruction  . c-section  1991  . COLON RESECTION SIGMOID  12/02/2012   [facility]   . HERNIA REPAIR  2007  . LAPAROSCOPIC ENDOMETRIOSIS FULGURATION  1984, 1989  . Port- a- cath placement  07/2015  . PORT-A-CATH REMOVAL Right 02/02/2016   Procedure: REMOVAL PORT-A-CATH;  Surgeon: [name], MD;  Location: [place];  Service: General;  Laterality: Right;  . RADIOACTIVE SEED GUIDED AXILLARY SENTINEL LYMPH NODE Left 02/02/2016   Procedure: LEFT BREAST MASTECTOMY AND RADIOACTIVE SEED GUIDED LEFT SENTINEL LYMPH NODE BIOPSY ;  Surgeon: [name], MD;  Location: [place];  Service: General;  Laterality: Left;    There were no vitals filed for this visit.      Subjective Assessment - 11/01/16 1529    Subjective Pt says she feels better this week, She is still having problem . She is still having pain especailly when she gets up in the morning. She has problems using the Flexitouch as much becasue it makes the pain hurts worse. She has cut down to only using it twice a week due to pain, And has been wearing the neck pad in the evenings. She has    Pertinent History Breast cancer diagnosed November2016 She had chemotherapy. but did have some neuropathy so it had to be  stopped. On June 8, she had a mastectomy with immedicate expander placement and with first set of lymphnodes removed, On June 20 she went for complete axillary lymph node dissection. She says she has had always been "full necked" but has neck and upper body fullness since the cancer was diagnosed. She reports she has osteopenia in low back and has had a fracture that was discovered in 2014 when she had a Dexa scan.   Patient Stated Goals to decrease the edema   Currently in Pain? Yes   Pain Score 7    Pain Location Axilla   Pain Orientation Left   Pain Descriptors / Indicators Sharp   Pain Type  Acute pain;Chronic pain                         [facility] Adult PT Treatment/Exercise - 11/01/16 0001      Manual Therapy   Manual Lymphatic Drainage (MLD) in supine short neck, superficial and deep abdominals, right axillary nodes, anterior interaxillary anastamosis, anterior and posterior neck and Lt inguinal nodes and Lt axillo-inguinal anastomosis briefly, then to right sidelying for posterior interaxillary anastamosis.    Passive ROM to left shoulder as tolerated    Kinesiotex Edema     Kinesiotix   Edema skinkote and kinsesiotap in fan shape along left lateral trunk and axilla                         Long Term Clinic Goals - 10/18/16 1319      CC Long Term Goal  #1   Title Patient will report a decrease in left shoulder pain by 75% so she can perform daily activities with greater ease   Baseline Pain had gotten better, but has increased pain and swelling    Time 8   Period Weeks   Status On-going     CC Long Term Goal  #2   Title Reduce circumference measurement at 6 cm superior to sternal notch to 50 cm or less.   Baseline 54.4 at eval, 52 cm at 06/26/2016 . 51 cm on 07/25/2016, 52.5 at 08/30/2016, 51.5 on 10/16/2016   Time 8   Period Weeks   Status On-going     CC Long Term Goal  #3   Title Patient will be independent in self manual lymph drainage.   Baseline pt is now using the Flexitouch    Status Achieved     CC Long Term Goal  #4   Title Patient will increase right shoulder flexion AROM to 140 degrees without any pain for improved ability to perform ADLs.   Baseline 145 on 10/31, 130 degrees 07/25/2016  limited by pain    Status Achieved     CC Long Term Goal  #5   Title Patient will utilize chip pack for at least 2 hours per day at home to assess whether compression garment would be beneficial.   Baseline Pt now has the Jovipak compression garment    Status Achieved     CC Long Term Goal  #6   Title Pt will be independent in  strenthening program    Baseline pt with too much pain with resistive exercise , but hopes to progress    Time 8   Period Weeks   Status On-going            Plan - 11/01/16 1735    Clinical Impression Statement Pt continues to struggle with  pain which she believes is from anastrozole. She thinks the pain is worse overall from the flexitouch  and she has not seen a reduction in her swelling.  She gets relieve in left axiila pain with manual lymph drainage and kinesiotape and wants to continue every other week.    Rehab Potential Fair   Clinical Impairments Affecting Rehab Potential chemotherapy with neuropathy    PT Frequency Biweekly   PT Next Visit Plan manua lymph drainage and manual techniques to manage pain update HEP  with progression as able        Patient will benefit from skilled therapeutic intervention in order to improve the following deficits and impairments:  Decreased range of motion, Impaired UE functional use, Decreased activity tolerance, Decreased knowledge of precautions, Decreased skin integrity, Impaired perceived functional ability, Pain, Decreased knowledge of use of DME, Increased edema, Postural dysfunction, Decreased strength, Impaired sensation, Decreased scar mobility  Visit Diagnosis: Abnormal posture  Left shoulder pain, unspecified chronicity  Lymphedema, not elsewhere classified  Stiffness of left shoulder joint     Problem List Patient Active Problem List   Diagnosis Date Noted  . Osteopenia determined by x-ray 09/20/2016  . Primary malignant neoplasm of left breast with stage 2 nodal metastasis per American Joint Committee on Cancer 7th edition (N2) ([place]) 02/14/2016  . Chemotherapy-induced neuropathy ([place]) 12/30/2015  . Insomnia 09/01/2015  . Malignant neoplasm of upper-outer quadrant of left breast in female, estrogen receptor positive ([place]) 08/08/2015   Donato Heinz. Owens Shark PT  Nor[place] Levo 11/01/2016, 5:39 PM  [place], [place], [address] Phone: [phone]   Fax:  [phone]  Name: ASTRYD PEARCY MRN: [id number] Date of Birth: 05/20/1961

## 2016-11-13 ENCOUNTER — Ambulatory Visit: Payer: 59 | Admitting: Podiatry

## 2016-11-15 ENCOUNTER — Ambulatory Visit: Payer: 59 | Admitting: Physical Therapy

## 2016-11-15 DIAGNOSIS — R293 Abnormal posture: Secondary | ICD-10-CM | POA: Diagnosis not present

## 2016-11-15 DIAGNOSIS — M25512 Pain in left shoulder: Secondary | ICD-10-CM

## 2016-11-15 DIAGNOSIS — M25612 Stiffness of left shoulder, not elsewhere classified: Secondary | ICD-10-CM

## 2016-11-15 DIAGNOSIS — I890 Lymphedema, not elsewhere classified: Secondary | ICD-10-CM

## 2016-11-15 DIAGNOSIS — G8929 Other chronic pain: Secondary | ICD-10-CM

## 2016-11-15 NOTE — Patient Instructions (Signed)
  PELVIC TILT  Lie on back, legs bent. Exhale, tilting top of pelvis back, pubic bone up, to flatten lower back. Inhale, rolling pelvis opposite way, top forward, pubic bone down, arch in back. Repeat __10__ times. Do __2__ sessions per day. Copyright  VHI. All rights reserved.     Lie with hips and knees bent. Slowly inhale, and then exhale. Pull navel toward spine and tighten pelvic floor. Hold for __10_ seconds. Continue to breathe in and out during hold. Rest for _10__ seconds. Repeat __10_ times. Do __2-3_ times a day.   Copyright  VHI. All rights reserved.  Knee Fold   Lie on back, legs bent, arms by sides. Exhale, lifting knee to chest. Inhale, returning. Keep abdominals flat, navel to spine. Repeat __10__ times, alternating legs. Do __2__ sessions per day.  Copyright  VHI. All rights reserved.  Knee Drop   Keep pelvis stable. Without rotating hips, slowly drop knee to side, pause, return to center, bring knee across midline toward opposite hip. Feel obliques engaging. Repeat for ___10_ times each leg.  Copyright  VHI. All rights reserved.  Isometric Hold With Pelvic Floor (Hook-Lying)    Copyright  VHI. All rights reserved.  Heel Slide to Straight   Slide one leg down to straight. Return. Be sure pelvis does not rock forward, tilt, rotate, or tip to side. Do _10__ times. Restabilize pelvis. Repeat with other leg. Do __1-2_ sets, __2_ times per day.  http://ss.exer.us/16   Copyright  VHI. All rights reserved.  Over Head Pull: Narrow and Wide Grip   Cancer Rehab [phone]   On back, knees bent, feet flat, band across thighs, elbows straight but relaxed. Pull hands apart (start). Keeping elbows straight, bring arms up and over head, hands toward floor. Keep pull steady on band. Hold momentarily. Return slowly, keeping pull steady, back to start. Then do same with a wider grip on the band (past shoulder width) Repeat _5-10__ times. Band color __yellow____   Side  Pull: Double Arm   On back, knees bent, feet flat. Arms perpendicular to body, shoulder level, elbows straight but relaxed. Pull arms out to sides, elbows straight. Resistance band comes across collarbones, hands toward floor. Hold momentarily. Slowly return to starting position. Repeat _5-10__ times. Band color _yellow____   Sword   On back, knees bent, feet flat, left hand on left hip, right hand above left. Pull right arm DIAGONALLY (hip to shoulder) across chest. Bring right arm along head toward floor. Hold momentarily. Slowly return to starting position. Repeat _5-10__ times. Do with left arm. Band color _yellow_____   Shoulder Rotation: Double Arm   On back, knees bent, feet flat, elbows tucked at sides, bent 90, hands palms up. Pull hands apart and down toward floor, keeping elbows near sides. Hold momentarily. Slowly return to starting position. Repeat _5-10__ times. Band color __yellow____   

## 2016-11-15 NOTE — Therapy (Signed)
[place], [place], [address] Phone: [phone]   Fax:  [phone]  Physical Therapy Treatment  Patient Details  Name: Alison [name] MRN: [id number] Date of Birth: Apr 25, 1961 Referring Provider: [name]   Encounter Date: 11/15/2016      PT End of Session - 11/15/16 1733    Visit Number 21   Number of Visits 31   Date for PT Re-Evaluation 12/20/16   PT Start Time 1300   PT Stop Time 1345   PT Time Calculation (min) 45 min   Activity Tolerance Patient tolerated treatment well   Behavior During Therapy [facility] for tasks assessed/performed      Past Medical History:  Diagnosis Date  . Asthma     seasonal asthma  . Breast cancer ([place])   . Complication of anesthesia 2007   aspiration pneumonia post hysterectomy.  Blood pressure would drop low- but no problems 01/2016- surgery  . History of blood transfusion   . History of mitral valve prolapse 1988   with palpitations  . History of radiation therapy 04/02/16- 05/21/16   Left Chest Wall, SCV, Axilla, IM nodes  . Hypertension   . Neuropathy ([place])    with chemo  . Osteopenia determined by x-ray   . Pneumonia    1980's and 1990's  . Raynaud's disease   . 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 Skin cancer 03/12/2014   basal- back    Past Surgical History:  Procedure Laterality Date  . ABDOMINAL HYSTERECTOMY  2007   with BSO  . APPENDECTOMY  2007  . AXILLARY LYMPH NODE DISSECTION Left 02/14/2016   Procedure: LEFT AXILLARY LYMPH NODE DISSECTION;  Surgeon: [name], MD;  Location: [place];  Service: General;  Laterality: Left;  . BREAST RECONSTRUCTION WITH PLACEMENT OF TISSUE EXPANDER AND FLEX HD (ACELLULAR HYDRATED DERMIS) Left 02/02/2016   Procedure: IMMEDIATE LEFT BREAST RECONSTRUCTION WITH PLACEMENT OF TISSUE EXPANDER AND FLEX HD (ACELLULAR HYDRATED DERMIS);  Surgeon: [name], DO;  Location:  [place];  Service: Plastics;  Laterality: Left;  . BREAST SURGERY Left 02/02/16   mastectomy with reconstruction  . c-section  1991  . COLON RESECTION SIGMOID  12/02/2012   [facility]   . HERNIA REPAIR  2007  . LAPAROSCOPIC ENDOMETRIOSIS FULGURATION  1984, 1989  . Port- a- cath placement  07/2015  . PORT-A-CATH REMOVAL Right 02/02/2016   Procedure: REMOVAL PORT-A-CATH;  Surgeon: [name], MD;  Location: [place];  Service: General;  Laterality: Right;  . RADIOACTIVE SEED GUIDED AXILLARY SENTINEL LYMPH NODE Left 02/02/2016   Procedure: LEFT BREAST MASTECTOMY AND RADIOACTIVE SEED GUIDED LEFT SENTINEL LYMPH NODE BIOPSY ;  Surgeon: [name], MD;  Location: [place];  Service: General;  Laterality: Left;    There were no vitals filed for this visit.      Subjective Assessment - 11/15/16 1304    Subjective (P)  Pt noticed that she has more pain when the weather is cold and damp.    Pertinent History (P)  Breast cancer diagnosed November2016 She had chemotherapy. but did have some neuropathy so it had to be stopped. On June 8, she had a mastectomy with immedicate expander placement and with first set of lymphnodes removed, On June 20 she went for complete axillary lymph node dissection. She says she has had always been "full necked" but has neck and upper body fullness since the cancer  was diagnosed. She reports she has osteopenia in low back and has had a fracture that was discovered in 2014 when she had a Dexa scan.   Patient Stated Goals (P)  to decrease the edema   Currently in Pain? (P)  Yes   Pain Score (P)  1    Pain Location (P)  Axilla   Pain Orientation (P)  Left                         [facility] Adult PT Treatment/Exercise - 11/15/16 0001      Lumbar Exercises: Standing   Other Standing Lumbar Exercises bilateral shoulder flexion with core engaged for abdominal activation      Lumbar Exercises: Supine   Ab Set 5 reps    Bent Knee Raise 5 reps   Bent Knee Raise Limitations unilateral and bilateral      Shoulder Exercises: Supine   Horizontal ABduction AROM;Both;5 reps   External Rotation AROM;Both;5 reps   Flexion AROM;Both;5 reps  narrow and wide grip    ABduction --  with dowel rod   Other Supine Exercises diagonal elevation with both arms      Manual Therapy   Manual Lymphatic Drainage (MLD) in supine short neck, superficial and deep abdominals, right axillary nodes, anterior interaxillary anastamosis, anterior and posterior neck and Lt inguinal nodes and Lt axillo-inguinal anastomosis briefly, then to right sidelying for posterior interaxillary anastamosis.    Passive ROM to left shoulder as tolerated    Kinesiotex Edema     Kinesiotix   Edema skinkote and kinsesiotap in fan shape along left lateral trunk and axilla                         Long Term Clinic Goals - 11/15/16 1737      CC Long Term Goal  #1   Title Patient will report a decrease in left shoulder pain by 75% so she can perform daily activities with greater ease   Baseline Pain had gotten better, but has increased pain and swelling    Status Partially Met  it depends on the weather      CC Long Term Goal  #2   Title Reduce circumference measurement at 6 cm superior to sternal notch to 50 cm or less.   Status Not Met     CC Long Term Goal  #3   Title Patient will be independent in self manual lymph drainage.   Baseline pt is now using the Flexitouch    Status Achieved     CC Long Term Goal  #4   Title Patient will increase right shoulder flexion AROM to 140 degrees without any pain for improved ability to perform ADLs.   Baseline 145 on 10/31, 130 degrees 07/25/2016  limited by pain    Status On-going     CC Long Term Goal  #5   Title Patient will utilize chip pack for at least 2 hours per day at home to assess whether compression garment would be beneficial.   Baseline Pt now has the Jovipak compression  garment    Status Achieved     CC Long Term Goal  #6   Title Pt will be independent in strenthening program    Status On-going            Plan - 11/15/16 1733    Clinical Impression Statement Pt has identified exacerbations in pain due to weather  especially with increased swelling in left axilla.  She is feeling better today and was able to upgrade to more exercises.  She has not made the progress she wants to see in the reduction of the fullness in her neck and back depsite lymphedema treatments.  She would like to know if there are other plastic surgery options and asked if I would contact Dr. Eusebio Friendly PA on her behalf.    Rehab Potential Fair   Clinical Impairments Affecting Rehab Potential chemotherapy with neuropathy    PT Frequency Biweekly   PT Duration 8 weeks   PT Treatment/Interventions ADLs/Self Care Home Management;Manual lymph drainage;Compression bandaging;Scar mobilization;Passive range of motion;Patient/family education;Taping;Manual techniques;Therapeutic exercise;Therapeutic activities;Orthotic Fit/Training;DME Instruction   PT Next Visit Plan manua lymph drainage and manual techniques to manage pain update HEP  with progression as able     Consulted and Agree with Plan of Care Patient      Patient will benefit from skilled therapeutic intervention in order to improve the following deficits and impairments:  Decreased range of motion, Impaired UE functional use, Decreased activity tolerance, Decreased knowledge of precautions, Decreased skin integrity, Impaired perceived functional ability, Pain, Decreased knowledge of use of DME, Increased edema, Postural dysfunction, Decreased strength, Impaired sensation, Decreased scar mobility  Visit Diagnosis: Abnormal posture  Left shoulder pain, unspecified chronicity  Lymphedema, not elsewhere classified  Stiffness of left shoulder joint  Chronic left shoulder pain     Problem List Patient Active Problem List    Diagnosis Date Noted  . Osteopenia determined by x-ray 09/20/2016  . Primary malignant neoplasm of left breast with stage 2 nodal metastasis per American Joint Committee on Cancer 7th edition (N2) ([place]) 02/14/2016  . Chemotherapy-induced neuropathy ([place]) 12/30/2015  . Insomnia 09/01/2015  . Malignant neoplasm of upper-outer quadrant of left breast in female, estrogen receptor positive ([place]) 08/08/2015   Donato Heinz. Owens Shark PT  Norwood Levo 11/15/2016, 5:38 PM  [place], [place], [address] Phone: [phone]   Fax:  [phone]  Name: KARLITA LICHTMAN MRN: [id number] Date of Birth: 1960/12/29

## 2016-11-20 ENCOUNTER — Other Ambulatory Visit: Payer: Self-pay | Admitting: Oncology

## 2016-11-21 ENCOUNTER — Other Ambulatory Visit (HOSPITAL_BASED_OUTPATIENT_CLINIC_OR_DEPARTMENT_OTHER): Payer: 59

## 2016-11-21 ENCOUNTER — Encounter: Payer: 59 | Admitting: *Deleted

## 2016-11-21 ENCOUNTER — Other Ambulatory Visit: Payer: 59

## 2016-11-21 DIAGNOSIS — Z170 Estrogen receptor positive status [ER+]: Principal | ICD-10-CM

## 2016-11-21 DIAGNOSIS — C50412 Malignant neoplasm of upper-outer quadrant of left female breast: Secondary | ICD-10-CM | POA: Diagnosis not present

## 2016-11-21 DIAGNOSIS — C779 Secondary and unspecified malignant neoplasm of lymph node, unspecified: Secondary | ICD-10-CM

## 2016-11-21 DIAGNOSIS — C50912 Malignant neoplasm of unspecified site of left female breast: Secondary | ICD-10-CM

## 2016-11-21 LAB — COMPREHENSIVE METABOLIC PANEL
ALT: 21 U/L (ref 0–55)
ANION GAP: 9 meq/L (ref 3–11)
AST: 25 U/L (ref 5–34)
Albumin: 4.3 g/dL (ref 3.5–5.0)
Alkaline Phosphatase: 58 U/L (ref 40–150)
BILIRUBIN TOTAL: 0.43 mg/dL (ref 0.20–1.20)
BUN: 13.5 mg/dL (ref 7.0–26.0)
CO2: 24 meq/L (ref 22–29)
Calcium: 9.7 mg/dL (ref 8.4–10.4)
Chloride: 105 mEq/L (ref 98–109)
Creatinine: 1 mg/dL (ref 0.6–1.1)
EGFR: 67 mL/min/{1.73_m2} — AB (ref >90)
Glucose: 104 mg/dl (ref 70–140)
Potassium: 4.2 mEq/L (ref 3.5–5.1)
Sodium: 138 mEq/L (ref 136–145)
TOTAL PROTEIN: 7.3 g/dL (ref 6.4–8.3)

## 2016-11-21 LAB — CBC WITH DIFFERENTIAL/PLATELET
BASO%: 0.3 % (ref 0.0–2.0)
BASOS ABS: 0 10*3/uL (ref 0.0–0.1)
EOS%: 1.6 % (ref 0.0–7.0)
Eosinophils Absolute: 0.1 10*3/uL (ref 0.0–0.5)
HEMATOCRIT: 34.4 % — AB (ref 34.8–46.6)
HEMOGLOBIN: 11.7 g/dL (ref 11.6–15.9)
LYMPH%: 34.8 % (ref 14.0–49.7)
MCH: 33.8 pg (ref 25.1–34.0)
MCHC: 33.9 g/dL (ref 31.5–36.0)
MCV: 99.7 fL (ref 79.5–101.0)
MONO#: 0.5 10*3/uL (ref 0.1–0.9)
MONO%: 14.7 % — AB (ref 0.0–14.0)
NEUT%: 48.6 % (ref 38.4–76.8)
NEUTROS ABS: 1.5 10*3/uL (ref 1.5–6.5)
Platelets: 309 10*3/uL (ref 145–400)
RBC: 3.45 10*6/uL — ABNORMAL LOW (ref 3.70–5.45)
RDW: 16.3 % — AB (ref 11.2–14.5)
WBC: 3.1 10*3/uL — AB (ref 3.9–10.3)
lymph#: 1.1 10*3/uL (ref 0.9–3.3)

## 2016-11-21 NOTE — Progress Notes (Signed)
11/21/2016 Patient in to clinic today for monthly labs, done at MD discretion (not required per protocol). Patient reports that she generally feels about the same, although she finds it easier to get up and moving with the warmer weather. She is trying to do some walking on the treadmill as well.  Anastrozole diary for Cycle 3 was returned, with additional days of dosing noted on original (before dose delay) Cycle 4 diary. Completed Cycle 4 anastrozole and palbociclib diaries were also returned today. Palbociclib bottle for cycle 4 dosing was returned empty by the patient today and was taken to the pharmacy for drug accountability by pharmacist Henreitta Leber.  Notified patient by phone that lab results (CBC and CMET) are within parameters for continued treatment. She will begin Cycle 5, Day 1 treatment tomorrow. Patient also reported that she will be out of town for two weeks on vacation in July, from the 6th through the 20th. Cindy S. Brigitte Pulse BSN, RN, [place] 11/21/2016 2:15 PM

## 2016-11-22 ENCOUNTER — Telehealth: Payer: Self-pay

## 2016-11-22 NOTE — Telephone Encounter (Signed)
  Here's what is needed for her appointments (this is in the [place] appointment desk, but now I can't figure out how to print it L):  Marland Kitchen Move 5/14 appts to mid-morning and add a note to patient that she will need to be fasting that day.  . Move 6/11 lab & MD appts to 6/4 or 6/5 (late morning or early afternoon).  . Add 7/23 lab & MD early afternoon appointments.  Please mail a copy of the appointments to her (I told her I'd need to make some changes, once we found out whether or not the cycle would be delayed), and also print a copy for me.  Thanks! [name]     (above is an email) 5/14,6/11 appts were moved due to fasting labs needed for research.  Also 7/23 was added as well.  A print out of all changed appts has been mailed to her.   Webb Silversmith

## 2016-11-24 NOTE — Progress Notes (Signed)
09/20/2016  Adverse Event Log Study/Protocol: AFT-05 PALLAS Cycle 2: 08/21/2016 (visit date) - 09/20/2016 Event Grade Onset Date Resolved Date Attribution to palbociclib Attribution to anastrozole Comments  WBC decreased 3 08/08/16 09/20/16 Yes No   Neutrophils decreased 3 09/05/16 09/20/16 Yes No   Lymphocyte count decreased 2 08/08/16 09/20/16 Yes No   Anemia, int. 1 08/21/2016 Ongoing Yes No   Elevated creatinine 2 09/20/2016 Ongoing No No   Cindy S. Brigitte Pulse BSN, RN, [place] 11/29/2016 8:24 AM

## 2016-11-26 ENCOUNTER — Ambulatory Visit (INDEPENDENT_AMBULATORY_CARE_PROVIDER_SITE_OTHER): Payer: 59 | Admitting: Podiatry

## 2016-11-26 DIAGNOSIS — B351 Tinea unguium: Secondary | ICD-10-CM

## 2016-11-26 DIAGNOSIS — L600 Ingrowing nail: Secondary | ICD-10-CM | POA: Diagnosis not present

## 2016-11-26 NOTE — Patient Instructions (Signed)

## 2016-11-28 ENCOUNTER — Telehealth: Payer: Self-pay | Admitting: *Deleted

## 2016-11-28 NOTE — Telephone Encounter (Addendum)
Pt states had ingrown toenail procedures on 11/28/2016 and now has white flesh at the area where the toenail was removed. I told pt that was due to the skin holding water in, that she should perform soaks, pat dry and cover area with a lightly coated neosporin fabric bandaid to allow the moisture to wick away. I instructed pt, she would have varying degrees of redness, drainage, swelling and pain over the next 4-6 weeks and it should begin to decrease the further she got from the procedure date. I instructed pt to continue soaks 2x daily until the end of the 4th week, at that last soak, leave of the neosporin bandaid and if there was no redness, swelling, pain or drainage she could stop the soaks, if the symptoms continued, continue the soaks 2 more weeks and try again. Pt states she is using Bandaid water-proof blister covers and will now change to the fabric bandaids. I told pt to call with concerns and told her to watch for any regression of symptoms if she has been improving, and to call our office.

## 2016-11-28 NOTE — Progress Notes (Signed)
Subjective:     Patient ID: Alison [name], female   DOB: February 15, 1961, 56 y.o.   MRN: [id number]  HPI 56 year old female presents the office today for concerns of painful ingrown toenails on the medial nail border to bilateral big toes. This been ongoing for about 1 year and the area is More painful with pressure in shoes. She has had no recent treatment for this. She denies any drainage or pus coming from the area. She has no complaints today.  Review of Systems  All other systems reviewed and are negative.      Objective:   Physical Exam General: AAO x3, NAD  Dermatological: There is incurvation on the medial aspect of bilateral hallux toenails. There is tenderness palpation in this area and there is no drainage or pus. There is localized edema how there is no ascending cellulitis. There is no malodor. The nails do appear to have yellow to white discoloration within the nails. There is no other clinical signs of infection.  Vascular: Dorsalis Pedis artery and Posterior Tibial artery pedal pulses are 2/4 bilateral with immedate capillary fill time. Pedal hair growth present. No varicosities and no lower extremity edema present bilateral. There is no pain with calf compression, swelling, warmth, erythema.   Neruologic: Grossly intact via light touch bilateral. Vibratory intact via tuning fork bilateral. Protective threshold with Semmes Wienstein monofilament intact to all pedal sites bilateral.   Musculoskeletal: No gross boney pedal deformities bilateral. No pain, crepitus, or limitation noted with foot and ankle range of motion bilateral. Muscular strength 5/5 in all groups tested bilateral.  Gait: Unassisted, Nonantalgic.      Assessment:     Symptomatic ingrown toenails bilateral medial hallux    Plan:     -Treatment options discussed including all alternatives, risks, and complications -Etiology of symptoms were discussed -At this time, the patient is requesting partial nail  removal with chemical matricectomy to the symptomatic portion of the nail. Risks and complications were discussed with the patient for which they understand and  verbally consent to the procedure. Under sterile conditions a total of 3 mL of a mixture of 2% lidocaine plain and 0.5% Marcaine plain was infiltrated in a hallux block fashion. Once anesthetized, the skin was prepped in sterile fashion. A tourniquet was then applied. Next the medial aspect of hallux nail border was then sharply excised making sure to remove the entire offending nail border. Once the nails were ensured to be removed area was debrided and the underlying skin was intact. There is no purulence identified in the procedure. Next phenol was then applied under standard conditions and copiously irrigated. Silvadene was applied. A dry sterile dressing was applied. After application of the dressing the tourniquet was removed and there is found to be an immediate capillary refill time to the digit. The patient tolerated the procedure well any complications. Post procedure instructions were discussed the patient for which he verbally understood. Follow-up in one week for nail check or sooner if any problems are to arise. Discussed signs/symptoms of infection and directed to call the office immediately should any occur or go directly to the emergency room. In the meantime, encouraged to call the office with any questions, concerns, changes symptoms.  Celesta Gentile, DPM

## 2016-11-29 ENCOUNTER — Ambulatory Visit: Payer: 59 | Attending: General Surgery | Admitting: Physical Therapy

## 2016-11-29 DIAGNOSIS — M25612 Stiffness of left shoulder, not elsewhere classified: Secondary | ICD-10-CM | POA: Diagnosis present

## 2016-11-29 DIAGNOSIS — M25512 Pain in left shoulder: Secondary | ICD-10-CM

## 2016-11-29 DIAGNOSIS — I890 Lymphedema, not elsewhere classified: Secondary | ICD-10-CM | POA: Diagnosis present

## 2016-11-29 DIAGNOSIS — R293 Abnormal posture: Secondary | ICD-10-CM | POA: Diagnosis present

## 2016-11-29 NOTE — Therapy (Signed)
[place], [place], [address] Phone: [phone]   Fax:  [phone]  Physical Therapy Treatment  Patient Details  Name: Alison [name] MRN: [id number] Date of Birth: 1961/06/19 Referring Provider: [name]   Encounter Date: 11/29/2016      PT End of Session - 11/29/16 1418    Visit Number 22   Number of Visits 31   Date for PT Re-Evaluation 12/20/16   PT Start Time 1300   PT Stop Time 1345   PT Time Calculation (min) 45 min   Activity Tolerance Patient tolerated treatment well   Behavior During Therapy [facility] for tasks assessed/performed      Past Medical History:  Diagnosis Date  . Asthma     seasonal asthma  . Breast cancer ([place])   . Complication of anesthesia 2007   aspiration pneumonia post hysterectomy.  Blood pressure would drop low- but no problems 01/2016- surgery  . History of blood transfusion   . History of mitral valve prolapse 1988   with palpitations  . History of radiation therapy 04/02/16- 05/21/16   Left Chest Wall, SCV, Axilla, IM nodes  . Hypertension   . Neuropathy ([place])    with chemo  . Osteopenia determined by x-ray   . Pneumonia    1980's and 1990's  . Raynaud's disease   . 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 Skin cancer 03/12/2014   basal- back    Past Surgical History:  Procedure Laterality Date  . ABDOMINAL HYSTERECTOMY  2007   with BSO  . APPENDECTOMY  2007  . AXILLARY LYMPH NODE DISSECTION Left 02/14/2016   Procedure: LEFT AXILLARY LYMPH NODE DISSECTION;  Surgeon: [name], MD;  Location: [place];  Service: General;  Laterality: Left;  . BREAST RECONSTRUCTION WITH PLACEMENT OF TISSUE EXPANDER AND FLEX HD (ACELLULAR HYDRATED DERMIS) Left 02/02/2016   Procedure: IMMEDIATE LEFT BREAST RECONSTRUCTION WITH PLACEMENT OF TISSUE EXPANDER AND FLEX HD (ACELLULAR HYDRATED DERMIS);  Surgeon: [name], DO;  Location: [place];  Service: Plastics;  Laterality: Left;  . BREAST SURGERY Left 02/02/16   mastectomy with reconstruction  . c-section  1991  . COLON RESECTION SIGMOID  12/02/2012   [facility]   . HERNIA REPAIR  2007  . LAPAROSCOPIC ENDOMETRIOSIS FULGURATION  1984, 1989  . Port- a- cath placement  07/2015  . PORT-A-CATH REMOVAL Right 02/02/2016   Procedure: REMOVAL PORT-A-CATH;  Surgeon: [name], MD;  Location: [facility];  Service: General;  Laterality: Right;  . RADIOACTIVE SEED GUIDED AXILLARY SENTINEL LYMPH NODE Left 02/02/2016   Procedure: LEFT BREAST MASTECTOMY AND RADIOACTIVE SEED GUIDED LEFT SENTINEL LYMPH NODE BIOPSY ;  Surgeon: [name], MD;  Location: [facility];  Service: General;  Laterality: Left;    There were no vitals filed for this visit.      Subjective Assessment - 11/29/16 1310    Subjective Pt's family has a new puppy at home and she has been busy taking care of it.  She has noticed increased pain in left arm with increased swelling in axilla.  She talked with Dr. Marla Roe about cosmetic procedures for her persistent lymphedema but she is not a candidate,  Pt will talk with Dr. Stephanie Coup about cool sculpting.  She may get some relief from axillary lymphedema with lat flap during final reconstruction procedure    Patient Stated Goals to decrease the edema   Currently  in Pain? Yes   Pain Score 3    Pain Location Axilla   Pain Orientation Left   Pain Descriptors / Indicators Heaviness                         [facility] Adult PT Treatment/Exercise - 11/29/16 0001      Shoulder Exercises: Standing   Other Standing Exercises reviewed isometrics, bilateral external rotation with red theraband and bilateral rows with elbows extended with red theraband      Manual Therapy   Manual Lymphatic Drainage (MLD) in supine short neck, superficial and deep abdominals, right axillary nodes, anterior interaxillary anastamosis, anterior and  posterior neck and Lt inguinal nodes and Lt axillo-inguinal anastomosis briefly, then to right sidelying for posterior interaxillary anastamosis.    Passive ROM to left shoulder as tolerated    Kinesiotex Edema     Kinesiotix   Edema skinkote and kinsesiotap in fan shape along left lateral trunk and axilla                         Long Term Clinic Goals - 11/15/16 1737      CC Long Term Goal  #1   Title Patient will report a decrease in left shoulder pain by 75% so she can perform daily activities with greater ease   Baseline Pain had gotten better, but has increased pain and swelling    Status Partially Met  it depends on the weather      CC Long Term Goal  #2   Title Reduce circumference measurement at 6 cm superior to sternal notch to 50 cm or less.   Status Not Met     CC Long Term Goal  #3   Title Patient will be independent in self manual lymph drainage.   Baseline pt is now using the Flexitouch    Status Achieved     CC Long Term Goal  #4   Title Patient will increase right shoulder flexion AROM to 140 degrees without any pain for improved ability to perform ADLs.   Baseline 145 on 10/31, 130 degrees 07/25/2016  limited by pain    Status On-going     CC Long Term Goal  #5   Title Patient will utilize chip pack for at least 2 hours per day at home to assess whether compression garment would be beneficial.   Baseline Pt now has the Jovipak compression garment    Status Achieved     CC Long Term Goal  #6   Title Pt will be independent in strenthening program    Status On-going            Plan - 11/29/16 1419    Clinical Impression Statement Lymphedema in neck, supraclavicular area, back and left axilla persist.  Pt reports relief with manual lymph drainage and kinesiotape to the area Pt agrees to increase and be more consistent with her shoulder exercise, especially for strenthening her interscapular area    Rehab Potential Fair   Clinical  Impairments Affecting Rehab Potential chemotherapy with neuropathy    PT Frequency Biweekly   PT Duration 8 weeks   PT Next Visit Plan manua lymph drainage and manual techniques to manage pain update HEP  with progression as able     Consulted and Agree with Plan of Care Patient      Patient will benefit from skilled therapeutic intervention in order to improve the following deficits  and impairments:  Decreased range of motion, Impaired UE functional use, Decreased activity tolerance, Decreased knowledge of precautions, Decreased skin integrity, Impaired perceived functional ability, Pain, Decreased knowledge of use of DME, Increased edema, Postural dysfunction, Decreased strength, Impaired sensation, Decreased scar mobility  Visit Diagnosis: Abnormal posture  Left shoulder pain, unspecified chronicity  Lymphedema, not elsewhere classified  Stiffness of left shoulder joint     Problem List Patient Active Problem List   Diagnosis Date Noted  . Osteopenia determined by x-ray 09/20/2016  . Primary malignant neoplasm of left breast with stage 2 nodal metastasis per American Joint Committee on Cancer 7th edition (N2) ([place]) 02/14/2016  . Chemotherapy-induced neuropathy ([place]) 12/30/2015  . Insomnia 09/01/2015  . Malignant neoplasm of upper-outer quadrant of left breast in female, estrogen receptor positive ([place]) 08/08/2015   Donato Heinz. Owens Shark PT  Nor[place] Levo 11/29/2016, 2:22 PM  [place], [place], [address] Phone: [phone]   Fax:  [phone]  Name: NIKISHA FLEECE MRN: [id number] Date of Birth: 1960/10/31

## 2016-12-06 ENCOUNTER — Ambulatory Visit (INDEPENDENT_AMBULATORY_CARE_PROVIDER_SITE_OTHER): Payer: Self-pay | Admitting: Podiatry

## 2016-12-06 DIAGNOSIS — Z9889 Other specified postprocedural states: Secondary | ICD-10-CM

## 2016-12-06 DIAGNOSIS — L600 Ingrowing nail: Secondary | ICD-10-CM

## 2016-12-06 NOTE — Patient Instructions (Signed)

## 2016-12-06 NOTE — Progress Notes (Signed)
Subjective: Alison [name] is a 56 y.o.  Female returns to office today for follow up evaluation after having bilateral Hallux medial partial nail avulsion performed. Patient has been soaking using epsom salts and applying topical antibiotic covered with bandaid daily. She states that the nails are not painful, swollen, red. Patient denies fevers, chills, nausea, vomiting. Denies any calf pain, chest pain, SOB.   Objective:  Vitals: Reviewed  General: Well developed, nourished, in no acute distress, alert and oriented x3   Dermatology: Skin is warm, dry and supple bilateral. Medial hallux nail border appears to be clean, dry, with mild granular tissue and surrounding scab. There is no surrounding erythema, edema, drainage/purulence. The remaining nails appear unremarkable at this time. There are no other lesions or other signs of infection present.  Neurovascular status: Intact. No lower extremity swelling; No pain with calf compression bilateral.  Musculoskeletal: Decreased tenderness to palpation of the medial hallux nail folds. Muscular strength within normal limits bilateral.   Assesement and Plan: S/p partial nail avulsion, doing well.   -Continue soaking in epsom salts twice a day followed by antibiotic ointment and a band-aid. Can leave uncovered at night. Continue this until completely healed.  -If the area has not healed in 2 weeks, call the office for follow-up appointment, or sooner if any problems arise.  -Monitor for any signs/symptoms of infection. Call the office immediately if any occur or go directly to the emergency room. Call with any questions/concerns.  Celesta Gentile, DPM

## 2016-12-13 ENCOUNTER — Other Ambulatory Visit: Payer: 59

## 2016-12-13 ENCOUNTER — Ambulatory Visit: Payer: 59 | Admitting: Physical Therapy

## 2016-12-13 ENCOUNTER — Ambulatory Visit: Payer: 59 | Admitting: Oncology

## 2016-12-19 ENCOUNTER — Ambulatory Visit: Payer: 59 | Admitting: Physical Therapy

## 2016-12-19 ENCOUNTER — Other Ambulatory Visit: Payer: Self-pay | Admitting: Oncology

## 2016-12-19 DIAGNOSIS — I890 Lymphedema, not elsewhere classified: Secondary | ICD-10-CM

## 2016-12-19 DIAGNOSIS — M25612 Stiffness of left shoulder, not elsewhere classified: Secondary | ICD-10-CM

## 2016-12-19 DIAGNOSIS — R293 Abnormal posture: Secondary | ICD-10-CM

## 2016-12-19 DIAGNOSIS — M25512 Pain in left shoulder: Secondary | ICD-10-CM

## 2016-12-19 NOTE — Therapy (Signed)
[place], [place], [address] Phone: [phone]   Fax:  [phone]  Physical Therapy Treatment  Patient Details  Name: Alison [name] MRN: [id number] Date of Birth: 10-Aug-1961 Referring Provider: Dr. Barry Dienes   Encounter Date: 12/19/2016      PT End of Session - 12/19/16 1058    Visit Number 23   Number of Visits 31   Date for PT Re-Evaluation 02/22/17   PT Start Time 0930   PT Stop Time 1015   PT Time Calculation (min) 45 min   Activity Tolerance Patient limited by pain;Patient tolerated treatment well   Behavior During Therapy [facility] for tasks assessed/performed      Past Medical History:  Diagnosis Date  . Asthma     seasonal asthma  . Breast cancer ([place])   . Complication of anesthesia 2007   aspiration pneumonia post hysterectomy.  Blood pressure would drop low- but no problems 01/2016- surgery  . History of blood transfusion   . History of mitral valve prolapse 1988   with palpitations  . History of radiation therapy 04/02/16- 05/21/16   Left Chest Wall, SCV, Axilla, IM nodes  . Hypertension   . Neuropathy ([place])    with chemo  . Osteopenia determined by x-ray   . Pneumonia    1980's and 1990's  . Raynaud's disease   . 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 Skin cancer 03/12/2014   basal- back    Past Surgical History:  Procedure Laterality Date  . ABDOMINAL HYSTERECTOMY  2007   with BSO  . APPENDECTOMY  2007  . AXILLARY LYMPH NODE DISSECTION Left 02/14/2016   Procedure: LEFT AXILLARY LYMPH NODE DISSECTION;  Surgeon: [name], MD;  Location: [place];  Service: General;  Laterality: Left;  . BREAST RECONSTRUCTION WITH PLACEMENT OF TISSUE EXPANDER AND FLEX HD (ACELLULAR HYDRATED DERMIS) Left 02/02/2016   Procedure: IMMEDIATE LEFT BREAST RECONSTRUCTION WITH PLACEMENT OF TISSUE EXPANDER AND FLEX HD (ACELLULAR HYDRATED DERMIS);  Surgeon: [name], DO;  Location: [place];  Service: Plastics;  Laterality: Left;  . BREAST SURGERY Left 02/02/16   mastectomy with reconstruction  . c-section  1991  . COLON RESECTION SIGMOID  12/02/2012   [facility]   . HERNIA REPAIR  2007  . LAPAROSCOPIC ENDOMETRIOSIS FULGURATION  1984, 1989  . Port- a- cath placement  07/2015  . PORT-A-CATH REMOVAL Right 02/02/2016   Procedure: REMOVAL PORT-A-CATH;  Surgeon: [name], MD;  Location: [place];  Service: General;  Laterality: Right;  . RADIOACTIVE SEED GUIDED AXILLARY SENTINEL LYMPH NODE Left 02/02/2016   Procedure: LEFT BREAST MASTECTOMY AND RADIOACTIVE SEED GUIDED LEFT SENTINEL LYMPH NODE BIOPSY ;  Surgeon: [name], MD;  Location: [place];  Service: General;  Laterality: Left;    There were no vitals filed for this visit.      Subjective Assessment - 12/19/16 0933    Subjective Pt states she had another fill April 17  and has been having increased pain in left chest just  lateral. expander.  She has visible fullness there.  She has had to take lots of pain medicine and the pain still prevents her from sleeping.  She still has pain and fullness in her axilla She says she gets moderate relief from PT treatment that lasts for several days. She plans to contiinue getting expander fills periorically until she has permanent reconstruction in August.  Pertinent History Breast cancer diagnosed November2016 She had chemotherapy. but did have some neuropathy so it had to be stopped. On June 8, she had a mastectomy with immedicate expander placement and with first set of lymphnodes removed, On June 20 she went for complete axillary lymph node dissection. She says she has had always been "full necked" but has neck and upper body fullness since the cancer was diagnosed. She reports she has osteopenia in low back and has had a fracture that was discovered in 2014 when she had a Dexa scan.   Patient Stated  Goals to decrease the edema   Currently in Pain? Yes   Pain Score 5   pt has taken pain medicine this moring.    Pain Location Chest   Pain Orientation Left;Lateral   Pain Descriptors / Indicators Constant;Aching;Sharp;Burning   Pain Type Acute pain   Pain Onset In the past 7 days  started 3 days ago    Pain Frequency Constant   Aggravating Factors  using her arm makes it worse.  She has not been able to do exercise    Pain Relieving Factors pain med    Effect of Pain on Daily Activities cannot sleep             [facility] PT Assessment - 12/19/16 0001      Assessment   Medical Diagnosis breast cancer   Referring Provider Dr. Barry Dienes    Onset Date/Surgical Date 07/03/15   Hand Dominance Right     AROM   Right Shoulder Flexion 170 Degrees                     [facility] Adult PT Treatment/Exercise - 12/19/16 0001      Self-Care   Other Self-Care Comments  thick foam in stockinette placed at area of fullness in lateral chest.  Pt reports the compression offerred some relief.      Manual Therapy   Manual Lymphatic Drainage (MLD) in supine short neck, superficial and deep abdominals, right axillary nodes, anterior interaxillary anastamosis, anterior and posterior neck and Lt inguinal nodes and Lt axillo-inguinal anastomosis briefly, then to right sidelying for posterior interaxillary anastamosis.    Passive ROM to left shoulder as tolerated    Kinesiotex Edema     Kinesiotix   Edema skinkote and kinsesiotap in fan shape along left lateral trunk and axilla                         Long Term Clinic Goals - 12/19/16 1103      CC Long Term Goal  #1   Title Patient will report a decrease in left shoulder pain by 75% so she can perform daily activities with greater ease   Baseline Pain had gotten better, but has increased pain and swelling    Time 8   Period Weeks   Status On-going     CC Long Term Goal  #2   Title Reduce circumference measurement at 6  cm superior to sternal notch to 50 cm or less.   Baseline 54.4 at eval, 52 cm at 06/26/2016 . 51 cm on 07/25/2016, 52.5 at 08/30/2016, 51.5 on 10/16/2016   Status Not Met     CC Long Term Goal  #3   Title Patient will be independent in self manual lymph drainage.   Baseline pt is now using the Flexitouch    Status Achieved     CC Long Term Goal  #  4   Title Patient will increase right shoulder flexion AROM to 140 degrees without any pain for improved ability to perform ADLs.   Baseline 145 on 10/31, 130 degrees 07/25/2016  limited by pain  170 degrees    Status Achieved     CC Long Term Goal  #5   Title Patient will utilize chip pack for at least 2 hours per day at home to assess whether compression garment would be beneficial.   Baseline Pt now has the Jovipak compression garment    Status Achieved     CC Long Term Goal  #6   Title Pt will be independent in strenthening program    Baseline Pt ability to follow through with exercise is limited by pain    Time 8   Period Weeks   Status On-going            Plan - 12/19/16 1059    Clinical Impression Statement Pt comes in with exacerbation of lateral chest edema and pain with delayed onset after an expander fill . She has been getting moderate relief of pain with less need for pain medication  for several days after physical therapy so she is renwed for 8 more weeks of 1 treatment every 2 weeks.  This fequency has been helping her manage her pain, though central ymphedema and congestion in her left axilla persist.    Rehab Potential Fair   Clinical Impairments Affecting Rehab Potential chemotherapy with neuropathy    PT Frequency Biweekly   PT Duration 8 weeks   PT Treatment/Interventions ADLs/Self Care Home Management;Manual lymph drainage;Compression bandaging;Scar mobilization;Passive range of motion;Patient/family education;Taping;Manual techniques;Therapeutic exercise;Therapeutic activities;Orthotic Fit/Training;DME Instruction    PT Next Visit Plan manua lymph drainage and manual techniques to manage pain update HEP  with progression as able     Consulted and Agree with Plan of Care Patient      Patient will benefit from skilled therapeutic intervention in order to improve the following deficits and impairments:  Decreased range of motion, Impaired UE functional use, Decreased activity tolerance, Decreased knowledge of precautions, Decreased skin integrity, Impaired perceived functional ability, Pain, Decreased knowledge of use of DME, Increased edema, Postural dysfunction, Decreased strength, Impaired sensation, Decreased scar mobility  Visit Diagnosis: Abnormal posture - Plan: PT plan of care cert/re-cert  Left shoulder pain, unspecified chronicity - Plan: PT plan of care cert/re-cert  Lymphedema, not elsewhere classified - Plan: PT plan of care cert/re-cert  Stiffness of left shoulder joint - Plan: PT plan of care cert/re-cert     Problem List Patient Active Problem List   Diagnosis Date Noted  . Osteopenia determined by x-ray 09/20/2016  . Primary malignant neoplasm of left breast with stage 2 nodal metastasis per American Joint Committee on Cancer 7th edition (N2) ([place]) 02/14/2016  . Chemotherapy-induced neuropathy ([place]) 12/30/2015  . Insomnia 09/01/2015  . Malignant neoplasm of upper-outer quadrant of left breast in female, estrogen receptor positive ([place]) 08/08/2015   Donato Heinz. Owens Shark PT  Norwood Levo 12/19/2016, 11:10 AM  [place], [place], [address] Phone: [phone]   Fax:  [phone]  Name: Alison [name] MRN: [id number] Date of Birth: April 04, 1961

## 2016-12-20 ENCOUNTER — Other Ambulatory Visit (HOSPITAL_BASED_OUTPATIENT_CLINIC_OR_DEPARTMENT_OTHER): Payer: 59

## 2016-12-20 ENCOUNTER — Ambulatory Visit (HOSPITAL_BASED_OUTPATIENT_CLINIC_OR_DEPARTMENT_OTHER): Payer: 59 | Admitting: Oncology

## 2016-12-20 ENCOUNTER — Encounter: Payer: 59 | Admitting: *Deleted

## 2016-12-20 ENCOUNTER — Encounter: Payer: Self-pay | Admitting: Pharmacist

## 2016-12-20 VITALS — BP 98/59 | HR 88 | Temp 98.2°F | Resp 18 | Ht 62.25 in | Wt 153.6 lb

## 2016-12-20 DIAGNOSIS — C50912 Malignant neoplasm of unspecified site of left female breast: Secondary | ICD-10-CM

## 2016-12-20 DIAGNOSIS — Z006 Encounter for examination for normal comparison and control in clinical research program: Secondary | ICD-10-CM

## 2016-12-20 DIAGNOSIS — C50412 Malignant neoplasm of upper-outer quadrant of left female breast: Secondary | ICD-10-CM

## 2016-12-20 DIAGNOSIS — C773 Secondary and unspecified malignant neoplasm of axilla and upper limb lymph nodes: Secondary | ICD-10-CM | POA: Diagnosis not present

## 2016-12-20 DIAGNOSIS — Z79811 Long term (current) use of aromatase inhibitors: Secondary | ICD-10-CM | POA: Diagnosis not present

## 2016-12-20 DIAGNOSIS — C779 Secondary and unspecified malignant neoplasm of lymph node, unspecified: Secondary | ICD-10-CM

## 2016-12-20 DIAGNOSIS — R0789 Other chest pain: Secondary | ICD-10-CM | POA: Diagnosis not present

## 2016-12-20 DIAGNOSIS — Z170 Estrogen receptor positive status [ER+]: Principal | ICD-10-CM

## 2016-12-20 DIAGNOSIS — G620 Drug-induced polyneuropathy: Secondary | ICD-10-CM

## 2016-12-20 DIAGNOSIS — T451X5A Adverse effect of antineoplastic and immunosuppressive drugs, initial encounter: Secondary | ICD-10-CM

## 2016-12-20 LAB — CBC WITH DIFFERENTIAL/PLATELET
BASO%: 4.3 % — ABNORMAL HIGH (ref 0.0–2.0)
Basophils Absolute: 0.1 10*3/uL (ref 0.0–0.1)
EOS ABS: 0.1 10*3/uL (ref 0.0–0.5)
EOS%: 2.9 % (ref 0.0–7.0)
HCT: 32.3 % — ABNORMAL LOW (ref 34.8–46.6)
HEMOGLOBIN: 11.2 g/dL — AB (ref 11.6–15.9)
LYMPH%: 37.9 % (ref 14.0–49.7)
MCH: 34.8 pg — AB (ref 25.1–34.0)
MCHC: 34.7 g/dL (ref 31.5–36.0)
MCV: 100.4 fL (ref 79.5–101.0)
MONO#: 0.4 10*3/uL (ref 0.1–0.9)
MONO%: 14.4 % — AB (ref 0.0–14.0)
NEUT#: 1 10*3/uL — ABNORMAL LOW (ref 1.5–6.5)
NEUT%: 40.5 % (ref 38.4–76.8)
Platelets: 281 10*3/uL (ref 145–400)
RBC: 3.22 10*6/uL — AB (ref 3.70–5.45)
RDW: 15.7 % — ABNORMAL HIGH (ref 11.2–14.5)
WBC: 2.6 10*3/uL — ABNORMAL LOW (ref 3.9–10.3)
lymph#: 1 10*3/uL (ref 0.9–3.3)

## 2016-12-20 LAB — COMPREHENSIVE METABOLIC PANEL
ALBUMIN: 4.1 g/dL (ref 3.5–5.0)
ALK PHOS: 49 U/L (ref 40–150)
ALT: 29 U/L (ref 0–55)
AST: 46 U/L — ABNORMAL HIGH (ref 5–34)
Anion Gap: 10 mEq/L (ref 3–11)
BUN: 14.2 mg/dL (ref 7.0–26.0)
CHLORIDE: 105 meq/L (ref 98–109)
CO2: 24 mEq/L (ref 22–29)
Calcium: 9.4 mg/dL (ref 8.4–10.4)
Creatinine: 1.1 mg/dL (ref 0.6–1.1)
EGFR: 54 mL/min/{1.73_m2} — AB (ref >90)
GLUCOSE: 99 mg/dL (ref 70–140)
POTASSIUM: 4.2 meq/L (ref 3.5–5.1)
SODIUM: 139 meq/L (ref 136–145)
Total Bilirubin: 0.31 mg/dL (ref 0.20–1.20)
Total Protein: 7 g/dL (ref 6.4–8.3)

## 2016-12-20 LAB — RESEARCH LABS

## 2016-12-20 MED ORDER — LIDOCAINE 5 % EX PTCH: 1.0000 | MEDICATED_PATCH | CUTANEOUS | 0 refills | Status: DC

## 2016-12-20 MED ORDER — INV-PALBOCICLIB 75 MG CAPS #23 ALLIANCE FOUNDATION AFT-05 (PALLAS): 75.0000 mg | ORAL_CAPSULE | Freq: Every day | ORAL | 0 refills | Status: DC

## 2016-12-20 MED ORDER — METHADONE HCL 5 MG PO TABS: 5.0000 mg | ORAL_TABLET | Freq: Three times a day (TID) | ORAL | 0 refills | Status: DC

## 2016-12-20 NOTE — Progress Notes (Signed)
[place]  [phone] Fax:([phone]) [phone]   ID: LARUA COLLIER DOB: Sep 09, 1960  MR#: [id number]  BMW#:[id number]  Patient Care Team: [name], MD as PCP - General (Internal Medicine) Erroll Luna, MD as Consulting Physician (General Surgery) Chauncey Cruel, MD as Consulting Physician (Oncology) Aloha Gell, MD as Consulting Physician (Obstetrics and Gynecology) Harriett Sine, MD as Consulting Physician (Dermatology) Benson Norway, RN as Registered Nurse (Oncology) Eppie Gibson, MD as Attending Physician (Radiation Oncology) PCP: [name], MD OTHER MD:  CHIEF COMPLAINT: Estrogen receptor positive breast cancer  CURRENT TREATMENT: Anastrozole, palbociclib, zolendronate  BREAST CANCER HISTORY: From the original intake note:  Traniece had routine screening mammography with tomography at [facility] February 20 11/14/2014 showing a possible mass in the right breast. Diagnostic right mammography with right breast ultrasonography 11/01/2014 found the breast density to be category C. In the upper outer quadrant of the right breast there was a partially obscured mass which on physical exam was palpable as an area of thickening at the 10:00 position 8 cm from the nipple. Ultrasound showed multiple cysts in the upper outer right breast corresponding to the mass in question.  In November 2016 the patient felt a change in the upper left breast and brought it to her primary care physician's attention. Diagnostic left mammography with tomosynthesis and left breast ultrasonography at the [facility] December 01/14/2015 found trabecular thickening throughout the left breast with skin thickening, but no circumscribed mass or suspicious calcifications. A rounded mass in the left axilla measured 6 mm. There was an additional mildly prominent lymph node in the left axilla which was what the patient had been palpating. Ultrasonography showed  ill-defined swelling throughout the inner left breast with overlying skin thickening.. At the 2:00 position 4 cm from the nipple there was an irregular hypoechoic mass measuring 2.3 cm. A second mass at the 1:00 area 3 cm from the nipple measured 1 cm. The distance between these 2 masses was 1.8 cm. There was also a round hypoechoic left axillary mass measuring 0.6 cm. Overall the was an area of 3.7 cm of mixed echogenicity corresponding to the area of palpable soft tissue swelling.   On 08/04/2015 the patient underwent biopsy of the mass at the 2:00 axis 4 cm from the nipple and a second biopsy was performed of the hypoechoic mass at the 1:00 position 3 cm from the nipple. Biopsy of the suspicious nodule in the lower left axilla was attempted but could not be completed as the mass became obscured after administration of lidocaine.  The pathology from both left breast biopsies 08/04/2015 ([id number]) showed invasive ductal carcinoma, grade 3. The prognostic profile obtained from the larger tumor showed estrogen receptor to be strongly positive at 95%, progesterone receptor positive at 30%, with an MIB-1 of 12%, and no HER-2 amplification, the signals ratio being 1.22 and the number per cell 2.20.  On 08/09/2015 the patient underwent biopsy of this suspicious left axillary lymph node noted above, and this showed ([id number]) metastatic carcinoma with extracapsular extension.  The patient's subsequent history is as detailed below  INTERVAL HISTORY: Jaquia returns today for follow-up of her estrogen receptor positive breast cancer. She continues on anastrozole, with generally good tolerance.  Hot flashes and vaginal dryness are not a major issue. She never developed the arthralgias or myalgias that many patients can experience on this medication. She obtains it at a good price.  She is also on palbociclib. We have  dropped her dose level twice and she is currently on 75 mg daily. This is the lowest dose  level allowed by this study, although clinically we frequently used the 75 mg every other day level. At any rate she is currently tolerating it quite well. She is minimally fatigued. She has no joint pain, no skin changes, no diarrhea and no mouth sores  She is also a candidate for the taxane study and we discussed participating in that today.  REVIEW OF SYSTEMS:  Rayn  Has significant chest wall pain on the left side. This involved the axilla and medial left upper arm as well as the chest wall. She was on oxycontin 40 mg BID for this with oxycodone for breakthrough. We referred her to the pain clinic for further evaluation and treatment and she did get an appointment but caled and cancelled because she says she was afraid of being out in crowds with low WBC and because she says one of the people who called was rude. Aside from these issues a detailed ROS today was stable  PAST MEDICAL HISTORY: Past Medical History:  Diagnosis Date  . Asthma     seasonal asthma  . Breast cancer ([place])   . Complication of anesthesia 2007   aspiration pneumonia post hysterectomy.  Blood pressure would drop low- but no problems 01/2016- surgery  . History of blood transfusion   . History of mitral valve prolapse 1988   with palpitations  . History of radiation therapy 04/02/16- 05/21/16   Left Chest Wall, SCV, Axilla, IM nodes  . Hypertension   . Neuropathy ([place])    with chemo  . Osteopenia determined by x-ray   . Pneumonia    1980's and 1990's  . Raynaud's disease   . 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 Skin cancer 03/12/2014   basal- back    PAST SURGICAL HISTORY: Past Surgical History:  Procedure Laterality Date  . ABDOMINAL HYSTERECTOMY  2007   with BSO  . APPENDECTOMY  2007  . AXILLARY LYMPH NODE DISSECTION Left 02/14/2016   Procedure: LEFT AXILLARY LYMPH NODE DISSECTION;  Surgeon: [name], MD;  Location: [facility];  Service: General;  Laterality: Left;  .  BREAST RECONSTRUCTION WITH PLACEMENT OF TISSUE EXPANDER AND FLEX HD (ACELLULAR HYDRATED DERMIS) Left 02/02/2016   Procedure: IMMEDIATE LEFT BREAST RECONSTRUCTION WITH PLACEMENT OF TISSUE EXPANDER AND FLEX HD (ACELLULAR HYDRATED DERMIS);  Surgeon: [name], DO;  Location: [facility];  Service: Plastics;  Laterality: Left;  . BREAST SURGERY Left 02/02/16   mastectomy with reconstruction  . c-section  1991  . COLON RESECTION SIGMOID  12/02/2012   [facility]   . HERNIA REPAIR  2007  . LAPAROSCOPIC ENDOMETRIOSIS FULGURATION  1984, 1989  . Port- a- cath placement  07/2015  . PORT-A-CATH REMOVAL Right 02/02/2016   Procedure: REMOVAL PORT-A-CATH;  Surgeon: [name], MD;  Location: [facility];  Service: General;  Laterality: Right;  . RADIOACTIVE SEED GUIDED AXILLARY SENTINEL LYMPH NODE Left 02/02/2016   Procedure: LEFT BREAST MASTECTOMY AND RADIOACTIVE SEED GUIDED LEFT SENTINEL LYMPH NODE BIOPSY ;  Surgeon: [name], MD;  Location: [facility];  Service: General;  Laterality: Left;    FAMILY HISTORY Family History  Problem Relation Age of Onset  . Lung cancer Father    the patient's father died from lung cancer at the age of 85 in the setting of tobacco abuse. The patient's mother died at  the age of 102 with pancreatic cancer. The patient had no siblings. There is no history of breast or ovarian cancer in the family.  GYNECOLOGIC HISTORY:  No LMP recorded. Patient has had a hysterectomy. Menarche age 58, first live birth age 27. The patient is GX P1. She is status post total hysterectomy with bilateral salpingo-oophorectomy. She has been on hormone replacement since that time and is tapering to off at the time of this dictation.   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 She used to work at [employer]. Her husband [name] works for [employer] division at a [employer] their son [name] is currently at home.    ADVANCED  DIRECTIVES: Not in place   HEALTH MAINTENANCE: Social History  Substance Use Topics  . Smoking status: Former Smoker    Packs/day: 1.00    Years: 10.00    Types: Cigarettes    Quit date: 11/25/2008  . Smokeless tobacco: Never Used     Comment: on and off smoker never consistent  . Alcohol use 2.4 oz/week    4 Glasses of wine per week     Colonoscopy: April 2014/ Raynaldo Opitz at [facility]  PAP: s/p hysterectomy  Bone density: August 2016/ osteopenia  Lipid panel:  Allergies  Allergen Reactions  . Sulfa Antibiotics Other (See Comments)    Blisters in mouth  . Zofran [Ondansetron Hcl] Other (See Comments)    Severe Headaches     Current Outpatient Prescriptions  Medication Sig Dispense Refill  . cetirizine (ZYRTEC) 10 MG tablet Take 10 mg by mouth daily.    . Cholecalciferol (VITAMIN D3) 2000 UNITS capsule Take 2,000 Units by mouth daily.     . Fenofibric Acid 105 MG TABS Take 1 tablet by mouth daily.    . fluticasone (FLONASE) 50 MCG/ACT nasal spray Place 2 sprays into both nostrils daily. Reported on 11/18/2015    . gabapentin (NEURONTIN) 300 MG capsule Take 1 capsule twice a day, 3 capsules at bedtime. 270 capsule 0  . Investigational aspirin/placebo 300 MG tablet [facility] [id number] Take 1 tablet by mouth daily. Take with food or a full glass of water.  Do not crush enteric coated tablets. 180 tablet 0  . Investigational palbociclib (IBRANCE) 75 MG capsule [facility] AFT-05 PALLAS Take 1 capsule (75 mg total) by mouth daily. Take with food. Swallow whole. Do not chew. Take on days 1-21. Repeat every 28 days. 42 capsule 0  . levocetirizine (XYZAL) 5 MG tablet Take 5 mg by mouth every evening.    . lidocaine (LIDODERM) 5 % Place 1 patch onto the skin daily. Remove & Discard patch within 12 hours or as directed by MD 30 patch 0  . LORazepam (ATIVAN) 0.5 MG tablet Take 0.5 mg by mouth at bedtime.    . methadone (DOLOPHINE) 5 MG tablet Take 1 tablet (5 mg total) by mouth every 8  (eight) hours. 90 tablet 0  . Multiple Vitamin (MULTIVITAMIN) tablet Take 1 tablet by mouth daily.    Marland Kitchen olmesartan (BENICAR) 20 MG tablet TAKE ONE TABLET EACH DAY 90 tablet 0  . omega-3 acid ethyl esters (LOVAZA) 1 g capsule Take 1 g by mouth 2 (two) times daily.    . polyethylene glycol (MIRALAX / GLYCOLAX) packet Take 17 g by mouth daily as needed for mild constipation. Reported on 01/06/2016    . Probiotic Product (PROBIOTIC DAILY PO) Take 1 capsule by mouth daily. [place]  prochlorperazine (COMPAZINE) 10 MG tablet Take 1 tablet (10 mg total) by mouth every 6 (six) hours as needed (Nausea or vomiting). 30 tablet 2  . traZODone (DESYREL) 50 MG tablet TAKE 3 TABLETS AT BEDTIME 90 tablet 0  . zolpidem (AMBIEN) 5 MG tablet Take 1 tablet (5 mg total) by mouth at bedtime as needed for sleep. 30 tablet 2   No current facility-administered medications for this visit.     OBJECTIVE: Middle-aged white woman who appears stated age 56:   12/20/16 1102  BP: (!) 98/59  Pulse: 88  Resp: 18  Temp: 98.2 F (36.8 C)     Body mass index is 27.87 kg/m.    ECOG FS:1 - Symptomatic but completely ambulatory  Sclerae unicteric, pupils round and equal Oropharynx clear and moist No cervical or supraclavicular adenopathy Lungs no rales or rhonchi Heart regular rate and rhythm Abd soft, obese, nontender, positive bowel sounds MSK no focal spinal tenderness, no upper extremity lymphedema Neuro: nonfocal, well oriented, appropriate affect Breasts: The right breast is unremarkable. The left brest is s/p mastectomy. There is no evidence of chest wall recurrence. Both axillae are bening  LAB RESULTS:  CMP     Component Value Date/Time   NA 139 12/20/2016 1020   K 4.2 12/20/2016 1020   CL 110 02/15/2016 0330   CO2 24 12/20/2016 1020   GLUCOSE 99 12/20/2016 1020   BUN 14.2 12/20/2016 1020   CREATININE 1.1 12/20/2016 1020   CALCIUM 9.4 12/20/2016 1020    PROT 7.0 12/20/2016 1020   ALBUMIN 4.1 12/20/2016 1020   AST 46 (H) 12/20/2016 1020   ALT 29 12/20/2016 1020   ALKPHOS 49 12/20/2016 1020   BILITOT 0.31 12/20/2016 1020   GFRNONAA >60 02/15/2016 0330   GFRAA >60 02/15/2016 0330    INo results found for: SPEP, UPEP  Lab Results  Component Value Date   WBC 2.6 (L) 12/20/2016   NEUTROABS 1.0 (L) 12/20/2016   HGB 11.2 (L) 12/20/2016   HCT 32.3 (L) 12/20/2016   MCV 100.4 12/20/2016   PLT 281 12/20/2016      Chemistry      Component Value Date/Time   NA 139 12/20/2016 1020   K 4.2 12/20/2016 1020   CL 110 02/15/2016 0330   CO2 24 12/20/2016 1020   BUN 14.2 12/20/2016 1020   CREATININE 1.1 12/20/2016 1020      Component Value Date/Time   CALCIUM 9.4 12/20/2016 1020   ALKPHOS 49 12/20/2016 1020   AST 46 (H) 12/20/2016 1020   ALT 29 12/20/2016 1020   BILITOT 0.31 12/20/2016 1020       No results found for: LABCA2  No components found for: [id number]  No results for input(s): INR in the last 168 hours.  Urinalysis    Component Value Date/Time   COLORURINE YELLOW 11/12/2015 [place] 11/12/2015 0837   LABSPEC 1.005 05/15/2016 1405   PHURINE 6.0 05/15/2016 1405   PHURINE 6.5 11/12/2015 0837   GLUCOSEU Negative 05/15/2016 1405   HGBUR Small 05/15/2016 1405   HGBUR MODERATE (A) 11/12/2015 0837   BILIRUBINUR Negative 05/15/2016 1405   KETONESUR Negative 05/15/2016 [place] 11/12/2015 0837   PROTEINUR Negative 05/15/2016 1405   PROTEINUR NEGATIVE 11/12/2015 0837   UROBILINOGEN 0.2 05/15/2016 1405   NITRITE Negative 05/15/2016 1405   NITRITE NEGATIVE 11/12/2015 0837   LEUKOCYTESUR Moderate 05/15/2016 1405    STUDIES: Bone density January 2018 shows T score of -1.7  PROTOCOLS: BWEL, ABC and PALLAS, taxane study  ASSESSMENT: 56 y.o. [place] woman status post biopsy of 2 separate masses in the left breast upper outer quadrant 08/04/2015, clinically mT2 N1, stage IIb/IIIa  invasive ductal carcinoma, grade 3, the larger mass being estrogen and progesterone receptor positive, HER-2 negative, with an MIB-1 of 12%  (a) biopsy of a left axillary lymph node 08/09/2015 was positive, with extracapsular extension  (1) neoadjuvant chemotherapy started 08/25/2015 consisting of doxorubicin and cyclophosphamide in dose dense fashion 4, completed 10/06/2015,  followed by weekly paclitaxel 10 completed 12/23/2015  (a) final 2 planned cycles of weekly paclitaxel omitted because of neuropathy concerns  (2) status post left mastectomy with targeted axillary dissection 02/02/2016 for a ypT2 ypN2a, stage IIIa invasive ductal carcinoma, grade 2, with negative margins, repeat prognostic panel showing estrogen receptor positive, but progesterone receptor and HER-2 negative  (a) completion axillary lymph node dissection 02/14/2016 showed an additional 2 out of 8 lymph nodes to be involved (final count 6 out of 10 lymph nodes positive  (b) expander placement at the time of left mastectomy  (3) adjuvant capecitabine starting concurrently with radiation--discontinued 05/21/2016  (4) adjuvant  radiation started 04/02/2016, completed 05/21/2016  (5) anastrozole started October 2017  (a) bone density August 2016 showed osteopenia  (b) repeat bone density 09/07/2016 shows a T score of -1.7.  (c) the patient is status post total abdominal hysterectomy with bilateral salpingo-oophorectomy.  (6) postoperative pain: Starting OxyContin 20 mg twice a day with oxycodone as needed for breakthrough pain 03/02/2016  (a) failed transition to tramadol, gabapentin, naproxen  (b) started methadone 5 mg TID as of 12/20/2016  (7) Research:  (a) B-WEL study ([facility] [id number])--enrolled in Arm 1 (received education)  (b) PALLAS AFT-05 study: randomized to treatment, started palbociclib 07/26/2016   (i) dose reduced to 100 mg, currently at 75 mg because of cytopenias  (c) taxane study  12/20/2016    PLAN: Jaelene is tolerating her current dose of palbociclib much better. She has minimal fatigue. She has had no joint pain, no skin changes, no diarrhea, or mouth sores. Her counts are holding up so far. Accordingly we are making no dose changes at this point.  Anastrozole is not causing her any significant problems.  She had an appointment with the pain clinic, but called it off partly because she was concerned regarding being exposed to other people in the flu season when her counts were low and partly because she felt the person who called her was reviewed. She has pretty much run out of her narcotics. She has significant pain in the left chest wall left armpit left medial upper extremity area which is not well controlled on tramadol, gabapentin, or naproxen.  The patienDi did fail to mention to me that Dr. Marla Roe gave her 20 tablets of hydrocodone/acetaminophen 5/325 on 12/13/2016. She does use only 1 pharmacy   We discussed issues related to narcotics. I generally do not feel comfortable prescribing narcotics for patients who do not have metastatic disease.  However she is being quite limited by her pain at this point. I think it may be appropriate to give methadone a try at this point. We discussed the possible toxicities, side effects and complications of this agent. We're going to start at 5 mg 3 times a day, and I suggested she take her first dose tonight. She is going to see me in approximately 2 weeks and we will be able to assess her initial response at that point.  I  also think she may benefit from Lidoderm and it went ahead and placed that prescription to her pharmacy. I explained how to use that patch.  Today she agreed to enrollment in the taxane study.  She already has an appointment with me for mid May. She will call with any problems that may develop before that visit.    :Chauncey Cruel, MD   12/20/2016 12:07 PM

## 2016-12-20 NOTE — Progress Notes (Signed)
12/20/2016 Patient in to clinic today unaccompanied for Cycle 6, Day 1 AFT-05 PALLAS study visit. Upon arrival to the clinic, PRO questionnaires were completed independently by the patient. The patient then proceeded to the lab for collection of routine labs, as well as research blood sample collection and study-required HgbA1c. Completed Cycle 5 diaries and palbociclib pill bottle were returned by patient today; pill bottle returned to pharmacy for drug accountability by pharmacist, Raul Del, with no capsules remaining.  Palbociclib and anti-hormone therapy drug diaries for Cycles 6-8 were given to patient to begin recording doses today. Three bottles of investigational palbociclib, were dispensed to patient today with instructions to begin dosing today. Based on lab results review and history and physical by Dr. [name], patient condition is acceptable for continued treatment at the reduced dose of 75 mg days 1-21. Confirmed upcoming appointments in May for the [place] study and June for the ABC study. The next [place] study visit is scheduled for July, following the patient's return from vacation. Cindy S. Brigitte Pulse BSN, RN, [place] 12/20/2016 1:16 PM  Adverse Event Log Study/Protocol: AFT-05 PALLAS Cycles 3-5: 09/20/2016 - 12/20/2016 Event Grade Onset Date Resolved Date Attribution to palbociclib Attribution to anastrozole Comments  Lymphopenia, int. 2 08/08/16 10/17/16 Yes No   Anemia, int. 1 08/21/2016 Ongoing Yes No   Elevated creatinine 2 09/20/2016 10/17/2016 No No   Leukopenia 1 09/26/16 10/17/16 Yes No   Leukopenia 2 10/17/16 10/24/16 Yes No   Leukopenia 1 10/24/16 12/20/16 Yes No   Leukopenia 2 12/20/2016 Ongoing Yes No   Neutropenia 3 10/17/16 10/24/16 Yes No   Neutropenia 2 12/20/2016 Ongoing Yes No   Fatigue 3 10/15/2016 Ongoing Yes Yes Improved to grade 1 on 10/24/2016  Ingrown toenails 2 11/26/2016 12/06/2016 No No Worsened from baseline, requiring intervention  AST increased 1 12/20/2016 Ongoing Yes No    Cindy S. Brigitte Pulse BSN, RN, CCRP 01/07/2017 4:10 PM

## 2016-12-20 NOTE — Progress Notes (Signed)
Consent documentation  Study code: [id number]  Met with patient following the patient's provider visit on 12/20/16 at 11:00. Patient was alone for this visit. Provided an overview of the "Pharmacogenetic analysis of toxicities related to administration of taxanes in breast cancer patients" study.  Consent form was reviewed with the patient (reviewed the study purpose, patient's role, possible side effects, cost, risk, information protection) All of the patient's questions were answered and she agreed to participate in the study. A signed copy of the consent form was given to the patient. All eligibility criteria have been met and patient has been enrolled in the study.  Study sample was collected via buccal swab after consent was obtained. Patient was informed that the sample would be sent to the lab for processing after samples were collected from 25 patients and that it would take approximately 2 weeks for the lab to process that sample.   Met with the patient for approximately 15 minutes.  Darl Pikes, PharmD, [place] Clinical Pharmacist- Oncology Pharmacy Resident

## 2016-12-21 LAB — HEMOGLOBIN A1C
ESTIMATED AVERAGE GLUCOSE: 105 mg/dL
HEMOGLOBIN A1C: 5.3 % (ref 4.8–5.6)

## 2016-12-24 ENCOUNTER — Telehealth: Payer: Self-pay | Admitting: *Deleted

## 2016-12-24 ENCOUNTER — Telehealth: Payer: Self-pay

## 2016-12-24 NOTE — Telephone Encounter (Signed)
5/14 lab and md appts moved per cindy s as pt will be out of town.  New appts will be 01/16/17 11/11:30.  Webb Silversmith

## 2016-12-24 NOTE — Telephone Encounter (Signed)
12/24/2016 Patient called with scheduling conflict, as she will be out of town Friday, May 11th through Monday, May 14th. After review of provider schedule, will move appointments to more closely align with the PALLAS cycle start, on Wednesday, May 23rd with fasting lab at 11:00 and MD appointment at 11:30. Cindy S. Brigitte Pulse BSN, RN, [place] 12/24/2016 11:24 AM

## 2016-12-27 ENCOUNTER — Other Ambulatory Visit: Payer: Self-pay | Admitting: Oncology

## 2017-01-01 ENCOUNTER — Telehealth: Payer: Self-pay | Admitting: Oncology

## 2017-01-01 NOTE — Telephone Encounter (Signed)
Faxed records to [facility] [phone]

## 2017-01-02 ENCOUNTER — Ambulatory Visit: Payer: 59 | Attending: General Surgery | Admitting: Physical Therapy

## 2017-01-02 ENCOUNTER — Other Ambulatory Visit: Payer: Self-pay | Admitting: General Surgery

## 2017-01-02 DIAGNOSIS — G8929 Other chronic pain: Secondary | ICD-10-CM | POA: Diagnosis present

## 2017-01-02 DIAGNOSIS — R293 Abnormal posture: Secondary | ICD-10-CM | POA: Diagnosis present

## 2017-01-02 DIAGNOSIS — I890 Lymphedema, not elsewhere classified: Secondary | ICD-10-CM | POA: Insufficient documentation

## 2017-01-02 DIAGNOSIS — M25612 Stiffness of left shoulder, not elsewhere classified: Secondary | ICD-10-CM | POA: Insufficient documentation

## 2017-01-02 DIAGNOSIS — M25512 Pain in left shoulder: Secondary | ICD-10-CM | POA: Insufficient documentation

## 2017-01-02 NOTE — Therapy (Signed)
[place], [place], [address] Phone: [phone]   Fax:  [phone]  Physical Therapy Treatment  Patient Details  Name: Alison [name] MRN: [id number] Date of Birth: 03-Jun-1961 Referring Provider: Dr. Barry Dienes   Encounter Date: 01/02/2017      PT End of Session - 01/02/17 1206    Visit Number 24   Number of Visits 31   Date for PT Re-Evaluation 02/22/17   PT Start Time 1105   PT Stop Time 1150   PT Time Calculation (min) 45 min   Activity Tolerance Patient tolerated treatment well   Behavior During Therapy [facility] for tasks assessed/performed      Past Medical History:  Diagnosis Date  . Asthma     seasonal asthma  . Breast cancer ([place])   . Complication of anesthesia 2007   aspiration pneumonia post hysterectomy.  Blood pressure would drop low- but no problems 01/2016- surgery  . History of blood transfusion   . History of mitral valve prolapse 1988   with palpitations  . History of radiation therapy 04/02/16- 05/21/16   Left Chest Wall, SCV, Axilla, IM nodes  . Hypertension   . Neuropathy ([place])    with chemo  . Osteopenia determined by x-ray   . Pneumonia    1980's and 1990's  . Raynaud's disease   . 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 Skin cancer 03/12/2014   basal- back    Past Surgical History:  Procedure Laterality Date  . ABDOMINAL HYSTERECTOMY  2007   with BSO  . APPENDECTOMY  2007  . AXILLARY LYMPH NODE DISSECTION Left 02/14/2016   Procedure: LEFT AXILLARY LYMPH NODE DISSECTION;  Surgeon: [name], MD;  Location: [place];  Service: General;  Laterality: Left;  . BREAST RECONSTRUCTION WITH PLACEMENT OF TISSUE EXPANDER AND FLEX HD (ACELLULAR HYDRATED DERMIS) Left 02/02/2016   Procedure: IMMEDIATE LEFT BREAST RECONSTRUCTION WITH PLACEMENT OF TISSUE EXPANDER AND FLEX HD (ACELLULAR HYDRATED DERMIS);  Surgeon: [name], DO;  Location: [place];  Service: Plastics;  Laterality: Left;  . BREAST SURGERY Left 02/02/16   mastectomy with reconstruction  . c-section  1991  . COLON RESECTION SIGMOID  12/02/2012   [facility]   . HERNIA REPAIR  2007  . LAPAROSCOPIC ENDOMETRIOSIS FULGURATION  1984, 1989  . Port- a- cath placement  07/2015  . PORT-A-CATH REMOVAL Right 02/02/2016   Procedure: REMOVAL PORT-A-CATH;  Surgeon: [name], MD;  Location: [place];  Service: General;  Laterality: Right;  . RADIOACTIVE SEED GUIDED AXILLARY SENTINEL LYMPH NODE Left 02/02/2016   Procedure: LEFT BREAST MASTECTOMY AND RADIOACTIVE SEED GUIDED LEFT SENTINEL LYMPH NODE BIOPSY ;  Surgeon: [name], MD;  Location: [place];  Service: General;  Laterality: Left;    There were no vitals filed for this visit.      Subjective Assessment - 01/02/17 1203    Subjective Pt reports she has seen Dr. [name] since last visit and has had some changes to her pain medication and she is doing better with that now. It only hurts some times. .  She also has lost about 5 pounds by making some dietary changes and doing exercise She is getting ready to go to the beach this weekend    Pertinent History Breast cancer diagnosed November2016 She had chemotherapy. but did have some neuropathy so it had to be stopped. On June 8, she had a  mastectomy with immedicate expander placement and with first set of lymphnodes removed, On June 20 she went for complete axillary lymph node dissection. She says she has had always been "full necked" but has neck and upper body fullness since the cancer was diagnosed. She reports she has osteopenia in low back and has had a fracture that was discovered in 2014 when she had a Dexa scan.   Patient Stated Goals to decrease the edema   Currently in Pain? Yes   Pain Score --  did not rate    Pain Location Axilla   Pain Orientation Left   Pain Descriptors / Indicators Tightness  fullness    Pain Type  Chronic pain               LYMPHEDEMA/ONCOLOGY QUESTIONNAIRE - 01/02/17 1114      Head and Neck   4 cm superior to sternal notch around neck 51 cm   6 cm superior to sternal notch around neck 51.5 cm   8 cm superior to sternal notch around neck 51 cm                  [facility] Adult PT Treatment/Exercise - 01/02/17 0001      Manual Therapy   Manual Lymphatic Drainage (MLD) in supine short neck, superficial and deep abdominals, right axillary nodes, anterior interaxillary anastamosis, anterior and posterior neck and Lt inguinal nodes and Lt axillo-inguinal anastomosis briefly, then to right sidelying for posterior interaxillary anastamosis.    Passive ROM to left shoulder as tolerated    Kinesiotex Edema     Kinesiotix   Edema skinkote and kinsesiotap in fan shape along left lateral trunk and axilla                         Long Term Clinic Goals - 12/19/16 1103      CC Long Term Goal  #1   Title Patient will report a decrease in left shoulder pain by 75% so she can perform daily activities with greater ease   Baseline Pain had gotten better, but has increased pain and swelling    Time 8   Period Weeks   Status On-going     CC Long Term Goal  #2   Title Reduce circumference measurement at 6 cm superior to sternal notch to 50 cm or less.   Baseline 54.4 at eval, 52 cm at 06/26/2016 . 51 cm on 07/25/2016, 52.5 at 08/30/2016, 51.5 on 10/16/2016   Status Not Met     CC Long Term Goal  #3   Title Patient will be independent in self manual lymph drainage.   Baseline pt is now using the Flexitouch    Status Achieved     CC Long Term Goal  #4   Title Patient will increase right shoulder flexion AROM to 140 degrees without any pain for improved ability to perform ADLs.   Baseline 145 on 10/31, 130 degrees 07/25/2016  limited by pain  170 degrees    Status Achieved     CC Long Term Goal  #5   Title Patient will utilize chip pack for at least 2 hours per  day at home to assess whether compression garment would be beneficial.   Baseline Pt now has the Jovipak compression garment    Status Achieved     CC Long Term Goal  #6   Title Pt will be independent in strenthening program    Baseline Pt  ability to follow through with exercise is limited by pain    Time 8   Period Weeks   Status On-going            Plan - 01/02/17 1206    Clinical Impression Statement Pt appears to be improved today.  She is moving her arm easier.  She has visibly less fullness at sternal level of chest and has decreased circumference measurements at lower neck. Encouraged pt to continue what she is doing as she is making progress. Alos encouraged her to try her compression neck wrap to see if she can get some improvement at neck    Rehab Potential Fair   Clinical Impairments Affecting Rehab Potential chemotherapy with neuropathy    PT Frequency Biweekly   PT Duration 8 weeks   PT Next Visit Plan manual lymph drainage and manual techniques to manage pain update HEP  with progression as able     Consulted and Agree with Plan of Care Patient      Patient will benefit from skilled therapeutic intervention in order to improve the following deficits and impairments:  Decreased range of motion, Impaired UE functional use, Decreased activity tolerance, Decreased knowledge of precautions, Decreased skin integrity, Impaired perceived functional ability, Pain, Decreased knowledge of use of DME, Increased edema, Postural dysfunction, Decreased strength, Impaired sensation, Decreased scar mobility  Visit Diagnosis: Abnormal posture  Lymphedema, not elsewhere classified  Stiffness of left shoulder joint  Chronic left shoulder pain     Problem List Patient Active Problem List   Diagnosis Date Noted  . Osteopenia determined by x-ray 09/20/2016  . Primary malignant neoplasm of left breast with stage 2 nodal metastasis per American Joint Committee on Cancer 7th edition  (N2) ([place]) 02/14/2016  . Chemotherapy-induced neuropathy ([place]) 12/30/2015  . Insomnia 09/01/2015  . Malignant neoplasm of upper-outer quadrant of left breast in female, estrogen receptor positive ([place]) 08/08/2015   Donato Heinz. Owens Shark PT  Norwood Levo 01/02/2017, 12:09 PM  [place], [place], [address] Phone: [phone]   Fax:  [phone]  Name: JAIME DOME MRN: [id number] Date of Birth: Feb 29, 1961

## 2017-01-07 ENCOUNTER — Ambulatory Visit: Payer: 59 | Admitting: Oncology

## 2017-01-07 ENCOUNTER — Other Ambulatory Visit: Payer: 59

## 2017-01-07 ENCOUNTER — Other Ambulatory Visit: Payer: Self-pay | Admitting: Oncology

## 2017-01-15 ENCOUNTER — Telehealth: Payer: Self-pay

## 2017-01-15 NOTE — Telephone Encounter (Signed)
Patient called and reminded of 5/23 fasting labs and to bring PALLAS cy6 diary and bottle

## 2017-01-16 ENCOUNTER — Encounter: Payer: 59 | Admitting: *Deleted

## 2017-01-16 ENCOUNTER — Other Ambulatory Visit (HOSPITAL_BASED_OUTPATIENT_CLINIC_OR_DEPARTMENT_OTHER): Payer: 59

## 2017-01-16 ENCOUNTER — Ambulatory Visit (HOSPITAL_BASED_OUTPATIENT_CLINIC_OR_DEPARTMENT_OTHER): Payer: 59 | Admitting: Oncology

## 2017-01-16 VITALS — Wt 152.4 lb

## 2017-01-16 VITALS — BP 127/70 | HR 73 | Temp 97.9°F | Resp 18 | Ht 62.0 in | Wt 152.5 lb

## 2017-01-16 DIAGNOSIS — Z006 Encounter for examination for normal comparison and control in clinical research program: Secondary | ICD-10-CM | POA: Diagnosis not present

## 2017-01-16 DIAGNOSIS — Z170 Estrogen receptor positive status [ER+]: Principal | ICD-10-CM

## 2017-01-16 DIAGNOSIS — R0789 Other chest pain: Secondary | ICD-10-CM

## 2017-01-16 DIAGNOSIS — Z79811 Long term (current) use of aromatase inhibitors: Secondary | ICD-10-CM

## 2017-01-16 DIAGNOSIS — C50412 Malignant neoplasm of upper-outer quadrant of left female breast: Secondary | ICD-10-CM

## 2017-01-16 DIAGNOSIS — C773 Secondary and unspecified malignant neoplasm of axilla and upper limb lymph nodes: Secondary | ICD-10-CM

## 2017-01-16 DIAGNOSIS — C50912 Malignant neoplasm of unspecified site of left female breast: Secondary | ICD-10-CM

## 2017-01-16 DIAGNOSIS — C779 Secondary and unspecified malignant neoplasm of lymph node, unspecified: Secondary | ICD-10-CM

## 2017-01-16 LAB — COMPREHENSIVE METABOLIC PANEL
ALBUMIN: 4.4 g/dL (ref 3.5–5.0)
ALK PHOS: 62 U/L (ref 40–150)
ALT: 22 U/L (ref 0–55)
ANION GAP: 9 meq/L (ref 3–11)
AST: 28 U/L (ref 5–34)
BUN: 11.3 mg/dL (ref 7.0–26.0)
CO2: 25 mEq/L (ref 22–29)
Calcium: 10.1 mg/dL (ref 8.4–10.4)
Chloride: 106 mEq/L (ref 98–109)
Creatinine: 1 mg/dL (ref 0.6–1.1)
EGFR: 66 mL/min/{1.73_m2} — AB (ref >90)
GLUCOSE: 109 mg/dL (ref 70–140)
POTASSIUM: 4.7 meq/L (ref 3.5–5.1)
SODIUM: 141 meq/L (ref 136–145)
Total Bilirubin: 0.31 mg/dL (ref 0.20–1.20)
Total Protein: 7.7 g/dL (ref 6.4–8.3)

## 2017-01-16 LAB — CBC WITH DIFFERENTIAL/PLATELET
BASO%: 5 % — ABNORMAL HIGH (ref 0.0–2.0)
BASOS ABS: 0.1 10*3/uL (ref 0.0–0.1)
EOS ABS: 0.1 10*3/uL (ref 0.0–0.5)
EOS%: 4.1 % (ref 0.0–7.0)
HCT: 34.5 % — ABNORMAL LOW (ref 34.8–46.6)
HEMOGLOBIN: 11.8 g/dL (ref 11.6–15.9)
LYMPH%: 35.8 % (ref 14.0–49.7)
MCH: 34.3 pg — AB (ref 25.1–34.0)
MCHC: 34.2 g/dL (ref 31.5–36.0)
MCV: 100.3 fL (ref 79.5–101.0)
MONO#: 0.3 10*3/uL (ref 0.1–0.9)
MONO%: 12.8 % (ref 0.0–14.0)
NEUT#: 0.9 10*3/uL — ABNORMAL LOW (ref 1.5–6.5)
NEUT%: 42.3 % (ref 38.4–76.8)
Platelets: 286 10*3/uL (ref 145–400)
RBC: 3.44 10*6/uL — AB (ref 3.70–5.45)
RDW: 15.1 % — ABNORMAL HIGH (ref 11.2–14.5)
WBC: 2.2 10*3/uL — ABNORMAL LOW (ref 3.9–10.3)
lymph#: 0.8 10*3/uL — ABNORMAL LOW (ref 0.9–3.3)

## 2017-01-16 LAB — RESEARCH LABS

## 2017-01-16 MED ORDER — METHADONE HCL 5 MG PO TABS: 5.0000 mg | ORAL_TABLET | Freq: Three times a day (TID) | ORAL | 0 refills | Status: DC

## 2017-01-16 MED ORDER — TRAZODONE HCL 50 MG PO TABS: 150.0000 mg | ORAL_TABLET | Freq: Every day | ORAL | 0 refills | Status: DC

## 2017-01-16 NOTE — Progress Notes (Signed)
[place]  [phone] [phone]   ID: DEBRINA KIZER DOB: October 31, 1960  MR#: [id number]  SWH#:[id number]  Patient Care Team: [name], MD as PCP - General (Internal Medicine) Erroll Luna, MD as Consulting Physician (General Surgery) Lotus Santillo, Virgie Dad, MD as Consulting Physician (Oncology) Aloha Gell, MD as Consulting Physician (Obstetrics and Gynecology) Harriett Sine, MD as Consulting Physician (Dermatology) Benson Norway, RN as Registered Nurse (Oncology) Eppie Gibson, MD as Attending Physician (Radiation Oncology) PCP: [name], MD OTHER MD:  CHIEF COMPLAINT: Estrogen receptor positive breast cancer  CURRENT TREATMENT: Anastrozole, zolendronate  BREAST CANCER HISTORY: From the original intake note:  Alys had routine screening mammography with tomography [facility] February 20 11/14/2014 showing a possible mass in the right breast. Diagnostic right mammography with right breast ultrasonography 11/01/2014 found the breast density to be category C. In the upper outer quadrant of the right breast there was a partially obscured mass which on physical exam was palpable as an area of thickening at the 10:00 position 8 cm from the nipple. Ultrasound showed multiple cysts in the upper outer right breast corresponding to the mass in question.  In November 2016 the patient felt a change in the upper left breast and brought it to her primary care physician's attention. Diagnostic left mammography with tomosynthesis and left breast ultrasonography at [facility] December 01/14/2015 found trabecular thickening throughout the left breast with skin thickening, but no circumscribed mass or suspicious calcifications. A rounded mass in the left axilla measured 6 mm. There was an additional mildly prominent lymph node in the left axilla which was what the patient had been palpating. Ultrasonography showed ill-defined  swelling throughout the inner left breast with overlying skin thickening.. At the 2:00 position 4 cm from the nipple there was an irregular hypoechoic mass measuring 2.3 cm. A second mass at the 1:00 area 3 cm from the nipple measured 1 cm. The distance between these 2 masses was 1.8 cm. There was also a round hypoechoic left axillary mass measuring 0.6 cm. Overall the was an area of 3.7 cm of mixed echogenicity corresponding to the area of palpable soft tissue swelling.   On 08/04/2015 the patient underwent biopsy of the mass at the 2:00 axis 4 cm from the nipple and a second biopsy was performed of the hypoechoic mass at the 1:00 position 3 cm from the nipple. Biopsy of the suspicious nodule in the lower left axilla was attempted but could not be completed as the mass became obscured after administration of lidocaine.  The pathology from both left breast biopsies 08/04/2015 ([id number]) showed invasive ductal carcinoma, grade 3. The prognostic profile obtained from the larger tumor showed estrogen receptor to be strongly positive at 95%, progesterone receptor positive at 30%, with an MIB-1 of 12%, and no HER-2 amplification, the signals ratio being 1.22 and the number per cell 2.20.  On 08/09/2015 the patient underwent biopsy of this suspicious left axillary lymph node noted above, and this showed ([id number]) metastatic carcinoma with extracapsular extension.  The patient's subsequent history is as detailed below  INTERVAL HISTORY: Tsering returns today for follow-up of her estrogen receptor positive breast cancer. She is being treated chiefly with anastrozole. She tolerates that well, and she does not complain of problems with hot flashes or vaginal dryness. She obtains a drug at a good cost.  She is also on the PALLAS study. She was randomized to treatment. We have had to drop  her dose twice already because of low counts and I am afraid with her counts today she may be out of the study.  As  far as side effects from the palbociclib itself, she does have mild fatigue and some nausea. Otherwise she has tolerated it well.  REVIEW OF SYSTEMS:  Ilaria continues to have significant pain in her left chest wall area and left arm. She was taking narcotics for this and was comfortable with that, but since we stopped those she has not felt that we are making headway. She did not think the tramadol or naproxen were helping. She is on gabapentin at fairly stiff doses already. I started her on methadone 5 mg 3 times a day and she says this makes her sleepy. She is trying to break it in half but when she does doesn't make her sleepy but it also doesn't control the pain. She is moderately constipated. Currently she is not requiring any specific intervention for that problem. She is concerned regarding the fatty tissue under her jaw. She wonders if that could be resolved by plastics. A detailed review of systems today was otherwise stable  PAST MEDICAL HISTORY: Past Medical History:  Diagnosis Date  . Asthma     seasonal asthma  . Breast cancer ([place])   . Complication of anesthesia 2007   aspiration pneumonia post hysterectomy.  Blood pressure would drop low- but no problems 01/2016- surgery  . History of blood transfusion   . History of mitral valve prolapse 1988   with palpitations  . History of radiation therapy 04/02/16- 05/21/16   Left Chest Wall, SCV, Axilla, IM nodes  . Hypertension   . Neuropathy ([place])    with chemo  . Osteopenia determined by x-ray   . Pneumonia    1980's and 1990's  . Raynaud's disease   . 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 Skin cancer 03/12/2014   basal- back    PAST SURGICAL HISTORY: Past Surgical History:  Procedure Laterality Date  . ABDOMINAL HYSTERECTOMY  2007   with BSO  . APPENDECTOMY  2007  . AXILLARY LYMPH NODE DISSECTION Left 02/14/2016   Procedure: LEFT AXILLARY LYMPH NODE DISSECTION;  Surgeon: [name], MD;   Location: [facility];  Service: General;  Laterality: Left;  . BREAST RECONSTRUCTION WITH PLACEMENT OF TISSUE EXPANDER AND FLEX HD (ACELLULAR HYDRATED DERMIS) Left 02/02/2016   Procedure: IMMEDIATE LEFT BREAST RECONSTRUCTION WITH PLACEMENT OF TISSUE EXPANDER AND FLEX HD (ACELLULAR HYDRATED DERMIS);  Surgeon: [name], DO;  Location: [facility];  Service: Plastics;  Laterality: Left;  . BREAST SURGERY Left 02/02/16   mastectomy with reconstruction  . c-section  1991  . COLON RESECTION SIGMOID  12/02/2012   [facility]   . HERNIA REPAIR  2007  . LAPAROSCOPIC ENDOMETRIOSIS FULGURATION  1984, 1989  . Port- a- cath placement  07/2015  . PORT-A-CATH REMOVAL Right 02/02/2016   Procedure: REMOVAL PORT-A-CATH;  Surgeon: [name], MD;  Location: [facility];  Service: General;  Laterality: Right;  . RADIOACTIVE SEED GUIDED AXILLARY SENTINEL LYMPH NODE Left 02/02/2016   Procedure: LEFT BREAST MASTECTOMY AND RADIOACTIVE SEED GUIDED LEFT SENTINEL LYMPH NODE BIOPSY ;  Surgeon: [name], MD;  Location: [facility];  Service: General;  Laterality: Left;    FAMILY HISTORY Family History  Problem Relation Age of Onset  . Lung cancer Father    the patient's father died from lung cancer at the age  of 68 in the setting of tobacco abuse. The patient's mother died at the age of 18 with pancreatic cancer. The patient had no siblings. There is no history of breast or ovarian cancer in the family.  GYNECOLOGIC HISTORY:  No LMP recorded. Patient has had a hysterectomy. Menarche age 7, first live birth age 51. The patient is GX P1. She is status post total hysterectomy with bilateral salpingo-oophorectomy. She has been on hormone replacement since that time and is tapering to off at the time of this dictation.   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 She used to work at [employer]. Her husband [name] works for [employer] division at a [employer] their son [name] is currently at home.    ADVANCED DIRECTIVES: Not in place   HEALTH MAINTENANCE: Social History  Substance Use Topics  . Smoking status: Former Smoker    Packs/day: 1.00    Years: 10.00    Types: Cigarettes    Quit date: 11/25/2008  . Smokeless tobacco: Never Used     Comment: on and off smoker never consistent  . Alcohol use 2.4 oz/week    4 Glasses of wine per week     Colonoscopy: April 2014/ Raynaldo Opitz [facility]  PAP: s/p hysterectomy  Bone density: August 2016/ osteopenia  Lipid panel:  Allergies  Allergen Reactions  . Sulfa Antibiotics Other (See Comments)    Blisters in mouth  . Zofran [Ondansetron Hcl] Other (See Comments)    Severe Headaches     Current Outpatient Prescriptions  Medication Sig Dispense Refill  . anastrozole (ARIMIDEX) 1 MG tablet Take 1 mg by mouth daily.    . cetirizine (ZYRTEC) 10 MG tablet Take 10 mg by mouth daily.    . Cholecalciferol (VITAMIN D3) 2000 UNITS capsule Take 2,000 Units by mouth daily.     . Fenofibric Acid 105 MG TABS Take 1 tablet by mouth daily.    . fluticasone (FLONASE) 50 MCG/ACT nasal spray Place 2 sprays into both nostrils daily. Reported on 11/18/2015    . gabapentin (NEURONTIN) 300 MG capsule Take 1 capsule twice a day, 3 capsules at bedtime. 270 capsule 0  . gabapentin (NEURONTIN) 300 MG capsule TAKE ONE CAPSULE THREE TIMES DAILY 270 capsule 0  . Investigational aspirin/placebo 300 MG tablet [facility] [id number] Take 1 tablet by mouth daily. Take with food or a full glass of water.  Do not crush enteric coated tablets. 180 tablet 0  . Investigational palbociclib (IBRANCE) 75 MG capsule [employer] AFT-05 PALLAS Take 1 capsule (75 mg total) by mouth daily. Take with food. Swallow whole. Do not chew. Take on days 1-21. Repeat every 28 days. 69 capsule 0  . levocetirizine (XYZAL) 5 MG tablet Take 5 mg by mouth every evening.    . lidocaine (LIDODERM) 5 % Place 1 patch onto the skin daily.  Remove & Discard patch within 12 hours or as directed by MD 30 patch 0  . LORazepam (ATIVAN) 0.5 MG tablet Take 0.5 mg by mouth at bedtime.    . methadone (DOLOPHINE) 5 MG tablet Take 1 tablet (5 mg total) by mouth every 8 (eight) hours. 90 tablet 0  . Multiple Vitamin (MULTIVITAMIN) tablet Take 1 tablet by mouth daily.    Marland Kitchen olmesartan (BENICAR) 20 MG tablet TAKE ONE TABLET EACH DAY 90 tablet 0  . omega-3 acid ethyl esters (LOVAZA) 1 g capsule Take 1 g by mouth 2 (two) times daily.    Marland Kitchen  polyethylene glycol (MIRALAX / GLYCOLAX) packet Take 17 g by mouth daily as needed for mild constipation. Reported on 01/06/2016    . Probiotic Product (PROBIOTIC DAILY PO) Take 1 capsule by mouth daily. Richardson prochlorperazine (COMPAZINE) 10 MG tablet Take 1 tablet (10 mg total) by mouth every 6 (six) hours as needed (Nausea or vomiting). 30 tablet 2  . traZODone (DESYREL) 50 MG tablet Take 3 tablets (150 mg total) by mouth at bedtime. 90 tablet 0  . zolpidem (AMBIEN) 5 MG tablet TAKE ONE TABLET AT BEDTIME AS NEEDED FORSLEEP 30 tablet 0   No current facility-administered medications for this visit.     OBJECTIVE: Middle-aged white woman Who appears well Vitals:   01/16/17 1116  BP: 127/70  Pulse: 73  Resp: 18  Temp: 97.9 F (36.6 C)     Body mass index is 27.89 kg/m.    ECOG FS:1 - Symptomatic but completely ambulatory  Sclerae unicteric, EOMs intact Oropharynx clear and moist No cervical or supraclavicular adenopathy Lungs no rales or rhonchi Heart regular rate and rhythm Abd soft, nontender, positive bowel sounds MSK no focal spinal tenderness, no upper extremity lymphedema; the area of chronic pain in the left chest wall and left arm is unremarkable to exam Neuro: nonfocal, well oriented, appropriate affect Breasts: The right breast is benign. The left breast has undergone mastectomy with no evidence of local recurrence. Both axillae are  benign.   LAB RESULTS:  CMP     Component Value Date/Time   NA 141 01/16/2017 1057   K 4.7 01/16/2017 1057   CL 110 02/15/2016 0330   CO2 25 01/16/2017 1057   GLUCOSE 109 01/16/2017 1057   BUN 11.3 01/16/2017 1057   CREATININE 1.0 01/16/2017 1057   CALCIUM 10.1 01/16/2017 1057   PROT 7.7 01/16/2017 1057   ALBUMIN 4.4 01/16/2017 1057   AST 28 01/16/2017 1057   ALT 22 01/16/2017 1057   ALKPHOS 62 01/16/2017 1057   BILITOT 0.31 01/16/2017 1057   GFRNONAA >60 02/15/2016 0330   GFRAA >60 02/15/2016 0330    INo results found for: SPEP, UPEP  Lab Results  Component Value Date   WBC 2.2 (L) 01/16/2017   NEUTROABS 0.9 (L) 01/16/2017   HGB 11.8 01/16/2017   HCT 34.5 (L) 01/16/2017   MCV 100.3 01/16/2017   PLT 286 01/16/2017      Chemistry      Component Value Date/Time   NA 141 01/16/2017 1057   K 4.7 01/16/2017 1057   CL 110 02/15/2016 0330   CO2 25 01/16/2017 1057   BUN 11.3 01/16/2017 1057   CREATININE 1.0 01/16/2017 1057      Component Value Date/Time   CALCIUM 10.1 01/16/2017 1057   ALKPHOS 62 01/16/2017 1057   AST 28 01/16/2017 1057   ALT 22 01/16/2017 1057   BILITOT 0.31 01/16/2017 1057       No results found for: LABCA2  No components found for: LABCA125  No results for input(s): INR in the last 168 hours.  Urinalysis    Component Value Date/Time   COLORURINE YELLOW 11/12/2015 [place] 11/12/2015 0837   LABSPEC 1.005 05/15/2016 1405   PHURINE 6.0 05/15/2016 1405   PHURINE 6.5 11/12/2015 0837   GLUCOSEU Negative 05/15/2016 1405   HGBUR Small 05/15/2016 1405   HGBUR MODERATE (A) 11/12/2015 0837   BILIRUBINUR Negative 05/15/2016 1405   KETONESUR Negative 05/15/2016 1405  KETONESUR NEGATIVE 11/12/2015 0837   PROTEINUR Negative 05/15/2016 1405   PROTEINUR NEGATIVE 11/12/2015 0837   UROBILINOGEN 0.2 05/15/2016 1405   NITRITE Negative 05/15/2016 1405   NITRITE NEGATIVE 11/12/2015 0837   LEUKOCYTESUR Moderate 05/15/2016 1405     STUDIES: Bone density January 2018 shows T score of -1.7  PROTOCOLS: BWEL, ABC and PALLAS, taxane study  ASSESSMENT: 55 y.o. [place] woman status post biopsy of 2 separate masses in the left breast upper outer quadrant 08/04/2015, clinically mT2 N1, stage IIb/IIIa invasive ductal carcinoma, grade 3, the larger mass being estrogen and progesterone receptor positive, HER-2 negative, with an MIB-1 of 12%  (a) biopsy of a left axillary lymph node 08/09/2015 was positive, with extracapsular extension  (1) neoadjuvant chemotherapy started 08/25/2015 consisting of doxorubicin and cyclophosphamide in dose dense fashion 4, completed 10/06/2015,  followed by weekly paclitaxel 10 completed 12/23/2015  (a) final 2 planned cycles of weekly paclitaxel omitted because of neuropathy concerns  (2) status post left mastectomy with targeted axillary dissection 02/02/2016 for a ypT2 ypN2a, stage IIIa invasive ductal carcinoma, grade 2, with negative margins, repeat prognostic panel showing estrogen receptor positive, but progesterone receptor and HER-2 negative  (a) completion axillary lymph node dissection 02/14/2016 showed an additional 2 out of 8 lymph nodes to be involved (final count 6 out of 10 lymph nodes positive  (b) expander placement at the time of left mastectomy  (3) adjuvant capecitabine starting concurrently with radiation--discontinued 05/21/2016  (4) adjuvant  radiation started 04/02/2016, completed 05/21/2016  (5) anastrozole started October 2017  (a) bone density August 2016 showed osteopenia  (b) repeat bone density 09/07/2016 shows a T score of -1.7.  (c) the patient is status post total abdominal hysterectomy with bilateral salpingo-oophorectomy.  (6) postoperative pain: Starting OxyContin 20 mg twice a day with oxycodone as needed for breakthrough pain 03/02/2016  (a) failed transition to tramadol, gabapentin, naproxen  (b) started methadone 5 mg TID as of 12/20/2016  (7)  Research:  (a) B-WEL study ([facility] [id number])--enrolled in Arm 1 (received education)  (b) PALLAS AFT-05 study: randomized to treatment, started palbociclib 07/26/2016   (i) dose reduced to 100 mg, then to 75 mg because of cytopenias  (c) taxane study 12/20/2016    PLAN: I spent approximately 30 minutes with Magda Paganini going over her complex issues. She generally tolerated the palbociclib well, although she did have mild nausea with it. However her blood counts have again dropped and per protocol she is going to have to come off the drug.  This is causing her some distress because she was to do "everything possible" to make sure her breast cancer does not recur. She received 1 dose of zolendronate in January. She tolerated it poorly, with aches and fevers for a day or 2. However she says she is very motivated to receive it again. I am scheduling her for the last Tuesday in June. I have suggested she take calcium 3 times a day on the day of treatment and that if she does develop a fever she take naproxen with food to control that.  Her chest wall pain remains not well controlled on her current medications. Specifically the methadone makes her sleepy. If she breaks the pill in half it makes her less sleepy but then also it doesn't work very well. She was doing better with narcotics but she understands I cannot prescribe those for her. We are placing a pain clinic referral for her (for the second time: The first pain clinic referral failed  because they were very discourteous when they called her, she says).  She is of course continuing on the other studies and she is being scheduled accordingly. She knows to call for any problems that may develop before her next visit   :Chauncey Cruel, MD   01/16/2017 12:46 PM

## 2017-01-16 NOTE — Progress Notes (Signed)
01/16/2017 Patient in to clinic today for scheduled Month 6 BWEL study visit, and unscheduled interim PALLAS visit at the end of Cycle 6.  [place] 6 visit Upon arrival to clinic, the BWEL study Follow-up Questionnaire was completed independently by the patient. Patient states that she has had nothing to eat since dinner at 5:00pm yesterday, and thus she had been fasting for 18 hours at the time of today's blood sample collection at 11:00am. Fasting blood glucose was collected with today's planned chemistry and hematology tests, collected monthly per MD discretion. BWEL study Month 6 research blood samples, including mandatory serum and plasma, plus the optional whole blood sample for the [id number] substudy were collected today. Weight and waist and hip circumference measurements were obtained per protocol. Thanked patient for her participation. She has expressed an interest in the materials she is receiving and plans to attend the upcoming patient webinar to learn more about breast cancer and sleep. She has not yet received a cookbook but did receive a mailing telling her that she would be getting one. Patient denies any musculoskeletal events including fractures, sprains, tendon or ligament injuries, and she has had no orthopedic surgeries since beginning study participation.  AFT-05 PALLAS - Cycle 6, Day 28 (interim visit) Due to [place] of 900 today, patient does not meet retreatment criteria for Cycle 7, which was expected to begin tomorrow. In addition, since this patient was previously reduced to the lowest dose level of palbociclib for previous episodes of grade 3 neutropenia, protocol therapy with palbociclib will be discontinued. This was confirmed by the treating investigator, Dr. Virgie Dad. Magrinat. Per protocol section 6.1.2. Treatment Phase patients will "Continue in Treatment Phase if patient permanently discontinues IP therapy (Arm A) within 2 years of Day 1 of Cycle 1 due to  non-iDFS event reasons, but continues Non-IP treatment." Patient will continue dosing with anastrozole, and thus will continue on study with anti-hormone therapy alone. Patient is in agreement with this plan and agrees to continue study assessments and participation.  Palbociclib pill bottles were returned by the patient today as follows: Cycle 6 bottle - empty, Cycle 7 and Cycle 8 bottles sealed and unopened. All three bottles were returned the pharmacy for drug accountability by pharmacist Kennith Center. Patient returned her completed Cycle 6 palbociclib diary. Cycle 7 and 8 palbociclib diaries were returned uncompleted, since patient will not continue protocol IP therapy. Patient is continuing to complete her anastrozole diaries.   Cindy S. Brigitte Pulse BSN, RN, CCRP 01/16/2017 2:12 PM   Adverse Event Log Study/Protocol: AFT-05 PALLAS Cycle 6: 12/20/2016 - 01/16/2017 Event Grade Onset Date Resolved Date Attribution to palbociclib Attribution to anastrozole Comments  Anemia, int. 1 08/21/2016 01/16/2017 Yes No   Leukopenia 2 12/20/2016 Ongoing Yes No   Neutropenia 2 12/20/2016 01/16/2017 Yes No   Neutropenia 3 01/16/2017 Ongoing Yes No   Fatigue 3 10/15/2016 10/24/2016 Yes Yes   Fatigue 1 10/24/2016 Ongoing Yes Yes   AST increased 1 12/20/2016 01/16/2017 Yes No   Lymphopenia 1 01/16/2017 Ongoing Yes No   Cindy S. Brigitte Pulse BSN, RN, [facility] 02/12/2017 4:34 PM

## 2017-01-17 ENCOUNTER — Ambulatory Visit: Payer: 59 | Admitting: Physical Therapy

## 2017-01-17 DIAGNOSIS — R293 Abnormal posture: Secondary | ICD-10-CM

## 2017-01-17 DIAGNOSIS — M25612 Stiffness of left shoulder, not elsewhere classified: Secondary | ICD-10-CM

## 2017-01-17 DIAGNOSIS — M25512 Pain in left shoulder: Secondary | ICD-10-CM

## 2017-01-17 DIAGNOSIS — G8929 Other chronic pain: Secondary | ICD-10-CM

## 2017-01-17 DIAGNOSIS — I890 Lymphedema, not elsewhere classified: Secondary | ICD-10-CM

## 2017-01-18 NOTE — Therapy (Signed)
[place], [place], [address] Phone: [phone]   Fax:  [phone]  Physical Therapy Treatment  Patient Details  Name: Alison [name] MRN: [id number] Date of Birth: October 24, 1960 Referring Provider: Dr. Barry Dienes   Encounter Date: 01/17/2017      PT End of Session - 01/18/17 0736    Visit Number 25   Number of Visits 31   Date for PT Re-Evaluation 02/22/17   PT Start Time 3154   PT Stop Time 1345   PT Time Calculation (min) 39 min   Activity Tolerance Patient tolerated treatment well   Behavior During Therapy [facility] for tasks assessed/performed      Past Medical History:  Diagnosis Date  . Asthma     seasonal asthma  . Breast cancer ([place])   . Complication of anesthesia 2007   aspiration pneumonia post hysterectomy.  Blood pressure would drop low- but no problems 01/2016- surgery  . History of blood transfusion   . History of mitral valve prolapse 1988   with palpitations  . History of radiation therapy 04/02/16- 05/21/16   Left Chest Wall, SCV, Axilla, IM nodes  . Hypertension   . Neuropathy ([place])    with chemo  . Osteopenia determined by x-ray   . Pneumonia    1980's and 1990's  . Raynaud's disease   . 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 Skin cancer 03/12/2014   basal- back    Past Surgical History:  Procedure Laterality Date  . ABDOMINAL HYSTERECTOMY  2007   with BSO  . APPENDECTOMY  2007  . AXILLARY LYMPH NODE DISSECTION Left 02/14/2016   Procedure: LEFT AXILLARY LYMPH NODE DISSECTION;  Surgeon: [name], MD;  Location: [place];  Service: General;  Laterality: Left;  . BREAST RECONSTRUCTION WITH PLACEMENT OF TISSUE EXPANDER AND FLEX HD (ACELLULAR HYDRATED DERMIS) Left 02/02/2016   Procedure: IMMEDIATE LEFT BREAST RECONSTRUCTION WITH PLACEMENT OF TISSUE EXPANDER AND FLEX HD (ACELLULAR HYDRATED DERMIS);  Surgeon: [name], DO;  Location: [place];  Service: Plastics;  Laterality: Left;  . BREAST SURGERY Left 02/02/16   mastectomy with reconstruction  . c-section  1991  . COLON RESECTION SIGMOID  12/02/2012   [facility]   . HERNIA REPAIR  2007  . LAPAROSCOPIC ENDOMETRIOSIS FULGURATION  1984, 1989  . Port- a- cath placement  07/2015  . PORT-A-CATH REMOVAL Right 02/02/2016   Procedure: REMOVAL PORT-A-CATH;  Surgeon: [name], MD;  Location: [place];  Service: General;  Laterality: Right;  . RADIOACTIVE SEED GUIDED AXILLARY SENTINEL LYMPH NODE Left 02/02/2016   Procedure: LEFT BREAST MASTECTOMY AND RADIOACTIVE SEED GUIDED LEFT SENTINEL LYMPH NODE BIOPSY ;  Surgeon: [name], MD;  Location: [facility];  Service: General;  Laterality: Left;    There were no vitals filed for this visit.      Subjective Assessment - 01/17/17 1312    Subjective 'I'm feeling better"  She said she has a lot of pain earlier in the week when the weather was humid  She saw Dr. 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    Pertinent History Breast cancer diagnosed November2016 She had chemotherapy. but did have some neuropathy so it had to be stopped. On June 8, she had a mastectomy with immedicate expander placement and with first set of lymphnodes removed, On June 20 she went for complete axillary  lymph node dissection. She says she has had always been "full necked" but has neck and upper body fullness since the cancer was diagnosed. She reports she has osteopenia in low back and has had a fracture that was discovered in 2014 when she had a Dexa scan.   Patient Stated Goals to decrease the edema   Currently in Pain? Yes   Pain Score 7    Pain Location Axilla   Pain Orientation Left   Pain Descriptors / Indicators --  fullness                          [facility] Adult PT Treatment/Exercise - 01/18/17 0001      Manual Therapy   Manual Lymphatic  Drainage (MLD) in supine short neck, superficial and deep abdominals, right axillary nodes, anterior interaxillary anastamosis, anterior and posterior neck and Lt inguinal nodes and Lt axillo-inguinal anastomosis briefly, then to right sidelying for posterior interaxillary anastamosis.    Passive ROM to left shoulder as tolerated    Kinesiotex Edema     Kinesiotix   Edema skinkote and kinsesiotap in fan shape along left lateral trunk and axilla                         Long Term Clinic Goals - 12/19/16 1103      CC Long Term Goal  #1   Title Patient will report a decrease in left shoulder pain by 75% so she can perform daily activities with greater ease   Baseline Pain had gotten better, but has increased pain and swelling    Time 8   Period Weeks   Status On-going     CC Long Term Goal  #2   Title Reduce circumference measurement at 6 cm superior to sternal notch to 50 cm or less.   Baseline 54.4 at eval, 52 cm at 06/26/2016 . 51 cm on 07/25/2016, 52.5 at 08/30/2016, 51.5 on 10/16/2016   Status Not Met     CC Long Term Goal  #3   Title Patient will be independent in self manual lymph drainage.   Baseline pt is now using the Flexitouch    Status Achieved     CC Long Term Goal  #4   Title Patient will increase right shoulder flexion AROM to 140 degrees without any pain for improved ability to perform ADLs.   Baseline 145 on 10/31, 130 degrees 07/25/2016  limited by pain  170 degrees    Status Achieved     CC Long Term Goal  #5   Title Patient will utilize chip pack for at least 2 hours per day at home to assess whether compression garment would be beneficial.   Baseline Pt now has the Jovipak compression garment    Status Achieved     CC Long Term Goal  #6   Title Pt will be independent in strenthening program    Baseline Pt ability to follow through with exercise is limited by pain    Time 8   Period Weeks   Status On-going            Plan - 01/18/17  0736    Clinical Impression Statement Pt continues to struggle with pain and fullness especially in left axilla.  It seems to be triggered by humid weather and she is limited functionally when it occurs.  She is considering going to another pain clinic.  She gets temporary  relief from PT and kinesiotape. Encouraged pt to continue exercise for ROM , Strength and her weight loss efforts.    Rehab Potential Fair   Clinical Impairments Affecting Rehab Potential chemotherapy with neuropathy    PT Frequency Biweekly   PT Duration 8 weeks   PT Treatment/Interventions ADLs/Self Care Home Management;Manual lymph drainage;Compression bandaging;Scar mobilization;Passive range of motion;Patient/family education;Taping;Manual techniques;Therapeutic exercise;Therapeutic activities;Orthotic Fit/Training;DME Instruction   PT Next Visit Plan manual lymph drainage and manual techniques to manage pain update HEP  with progression as able     Consulted and Agree with Plan of Care Patient      Patient will benefit from skilled therapeutic intervention in order to improve the following deficits and impairments:  Decreased range of motion, Impaired UE functional use, Decreased activity tolerance, Decreased knowledge of precautions, Decreased skin integrity, Impaired perceived functional ability, Pain, Decreased knowledge of use of DME, Increased edema, Postural dysfunction, Decreased strength, Impaired sensation, Decreased scar mobility  Visit Diagnosis: Abnormal posture  Lymphedema, not elsewhere classified  Stiffness of left shoulder joint  Chronic left shoulder pain     Problem List Patient Active Problem List   Diagnosis Date Noted  . Osteopenia determined by x-ray 09/20/2016  . Primary malignant neoplasm of left breast with stage 2 nodal metastasis per American Joint Committee on Cancer 7th edition (N2) ([place]) 02/14/2016  . 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 Insomnia 09/01/2015  .  Malignant neoplasm of upper-outer quadrant of left breast in female, estrogen receptor positive ([place]) 08/08/2015   Donato Heinz. Owens Shark PT  Norwood Levo 01/18/2017, 7:39 AM  [place], [place], [address] Phone: [phone]   Fax:  [phone]  Name: AILA TERRA MRN: [id number] Date of Birth: 30-Nov-1960

## 2017-01-24 ENCOUNTER — Other Ambulatory Visit: Payer: Self-pay | Admitting: Oncology

## 2017-01-28 ENCOUNTER — Telehealth: Payer: Self-pay

## 2017-01-28 ENCOUNTER — Other Ambulatory Visit: Payer: Self-pay | Admitting: Oncology

## 2017-01-28 NOTE — Telephone Encounter (Signed)
Called and left a message for patient to remind her to bring research study bottles and pill diaries.

## 2017-01-29 ENCOUNTER — Other Ambulatory Visit (HOSPITAL_BASED_OUTPATIENT_CLINIC_OR_DEPARTMENT_OTHER): Payer: 59

## 2017-01-29 ENCOUNTER — Ambulatory Visit (HOSPITAL_BASED_OUTPATIENT_CLINIC_OR_DEPARTMENT_OTHER): Payer: 59 | Admitting: Oncology

## 2017-01-29 ENCOUNTER — Encounter: Payer: 59 | Admitting: *Deleted

## 2017-01-29 VITALS — BP 121/64 | HR 92 | Temp 98.2°F | Resp 18 | Ht 62.0 in | Wt 147.2 lb

## 2017-01-29 DIAGNOSIS — Z170 Estrogen receptor positive status [ER+]: Secondary | ICD-10-CM | POA: Diagnosis not present

## 2017-01-29 DIAGNOSIS — C50912 Malignant neoplasm of unspecified site of left female breast: Secondary | ICD-10-CM

## 2017-01-29 DIAGNOSIS — C779 Secondary and unspecified malignant neoplasm of lymph node, unspecified: Secondary | ICD-10-CM

## 2017-01-29 DIAGNOSIS — C773 Secondary and unspecified malignant neoplasm of axilla and upper limb lymph nodes: Secondary | ICD-10-CM | POA: Diagnosis not present

## 2017-01-29 DIAGNOSIS — G8928 Other chronic postprocedural pain: Secondary | ICD-10-CM

## 2017-01-29 DIAGNOSIS — C50412 Malignant neoplasm of upper-outer quadrant of left female breast: Secondary | ICD-10-CM

## 2017-01-29 DIAGNOSIS — Z006 Encounter for examination for normal comparison and control in clinical research program: Secondary | ICD-10-CM | POA: Diagnosis not present

## 2017-01-29 DIAGNOSIS — Z79811 Long term (current) use of aromatase inhibitors: Secondary | ICD-10-CM

## 2017-01-29 DIAGNOSIS — Z7982 Long term (current) use of aspirin: Secondary | ICD-10-CM | POA: Diagnosis not present

## 2017-01-29 LAB — COMPREHENSIVE METABOLIC PANEL
ALT: 12 U/L (ref 0–55)
AST: 19 U/L (ref 5–34)
Albumin: 4.2 g/dL (ref 3.5–5.0)
Alkaline Phosphatase: 56 U/L (ref 40–150)
Anion Gap: 12 mEq/L — ABNORMAL HIGH (ref 3–11)
BUN: 15.8 mg/dL (ref 7.0–26.0)
CHLORIDE: 107 meq/L (ref 98–109)
CO2: 22 meq/L (ref 22–29)
Calcium: 10.2 mg/dL (ref 8.4–10.4)
Creatinine: 0.9 mg/dL (ref 0.6–1.1)
EGFR: 70 mL/min/{1.73_m2} — ABNORMAL LOW (ref >90)
GLUCOSE: 131 mg/dL (ref 70–140)
POTASSIUM: 4 meq/L (ref 3.5–5.1)
SODIUM: 141 meq/L (ref 136–145)
Total Bilirubin: 0.33 mg/dL (ref 0.20–1.20)
Total Protein: 7.5 g/dL (ref 6.4–8.3)

## 2017-01-29 LAB — CBC WITH DIFFERENTIAL/PLATELET
BASO%: 4.3 % — AB (ref 0.0–2.0)
BASOS ABS: 0.2 10*3/uL — AB (ref 0.0–0.1)
EOS ABS: 0.2 10*3/uL (ref 0.0–0.5)
EOS%: 3.4 % (ref 0.0–7.0)
HEMATOCRIT: 37.1 % (ref 34.8–46.6)
HGB: 12.7 g/dL (ref 11.6–15.9)
LYMPH#: 0.9 10*3/uL (ref 0.9–3.3)
LYMPH%: 19 % (ref 14.0–49.7)
MCH: 34.4 pg — AB (ref 25.1–34.0)
MCHC: 34.3 g/dL (ref 31.5–36.0)
MCV: 100.4 fL (ref 79.5–101.0)
MONO#: 0.4 10*3/uL (ref 0.1–0.9)
MONO%: 8.8 % (ref 0.0–14.0)
NEUT#: 2.9 10*3/uL (ref 1.5–6.5)
NEUT%: 64.5 % (ref 38.4–76.8)
PLATELETS: 386 10*3/uL (ref 145–400)
RBC: 3.7 10*6/uL (ref 3.70–5.45)
RDW: 14.2 % (ref 11.2–14.5)
WBC: 4.5 10*3/uL (ref 3.9–10.3)

## 2017-01-29 IMAGING — US US BREAST BX W LOC DEV 1ST LESION IMG BX SPEC US GUIDE*L*
1 series · 12 of 17 positions shown · non-contrast
Comparison: Previous exam(s).

ADDENDUM:
Pathology revealed grade III invasive ductal carcinoma with ductal
carcinoma in situ in the left breast at both [DATE] and [DATE]. This was
found to be concordant by Dr. Hmmayabi Ghanee Abdul Raheem. Pathology was discussed
with the patient by telephone. She reported minimal bleeding from
the sites on the evening of the biopsy, but has had no subsequent
problems. Post biopsy instructions and care were reviewed and her
questions were answered.

She has a biopsy scheduled of a left axillary lymph node on August 09, 2015 and is aware of the appointment. The patient has been
scheduled at [REDACTED] [REDACTED] on
August 10, 2015. Bilateral breast MRI would be recommended to
evaluate for multicentric disease, as patient has additional
thickening and edema throughout the medial right breast. She was
encouraged to come to The [REDACTED] for
educational materials. My number was provided for additional
questions and concerns.
Pathology results reported by Paulus N Ceejay RN, BSN on August 05, 2015.
CLINICAL DATA: Patient with 2 masses in the upper left breast
returns today for ultrasound-guided biopsies.
Patient with additional suspicious nodule in the lower left axilla
for ultrasound-guided biopsy.
EXAM:
ULTRASOUND GUIDED LEFT BREAST CORE NEEDLE BIOPSY

[Series 1: us breast bx w loc dev 1st lesion img bx spec us g · 0.07mm/px · 12 of 17 slices shown]
[im 1/17]
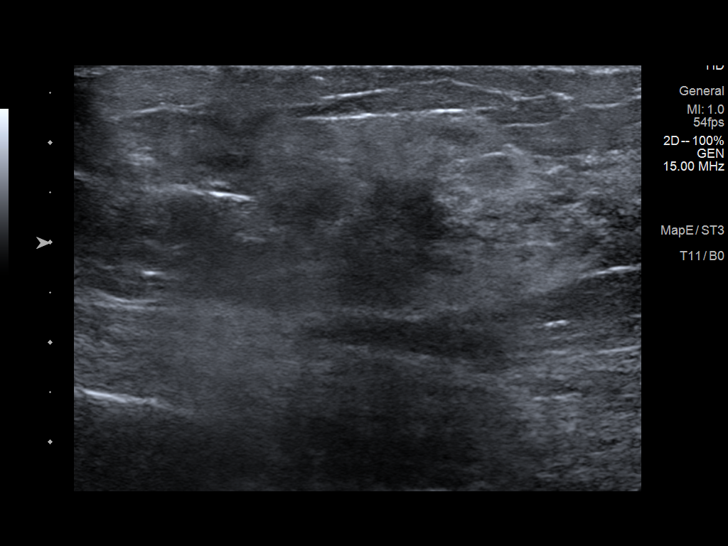
[im 3/17]
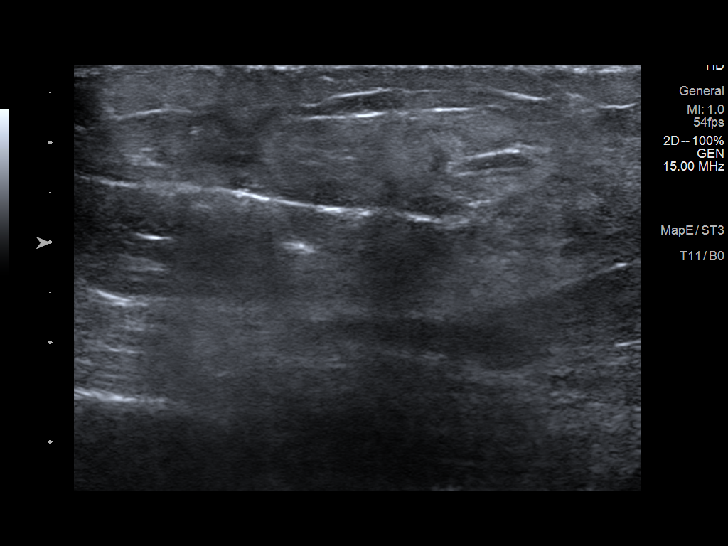
[im 4/17]
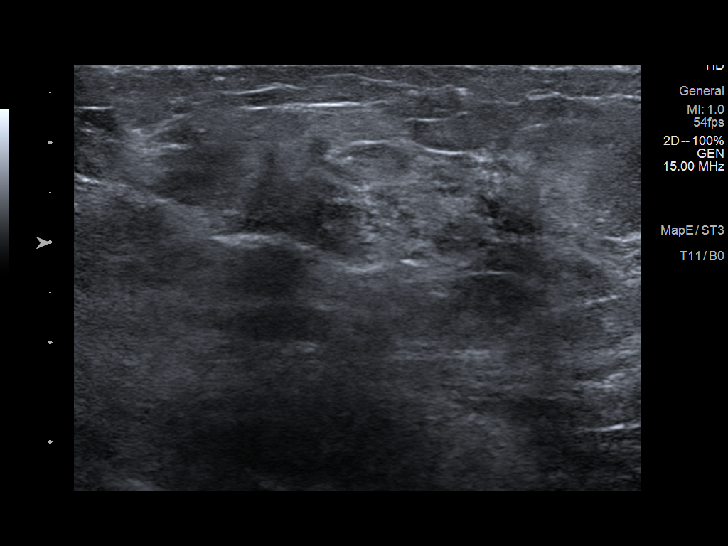
[im 5/17]
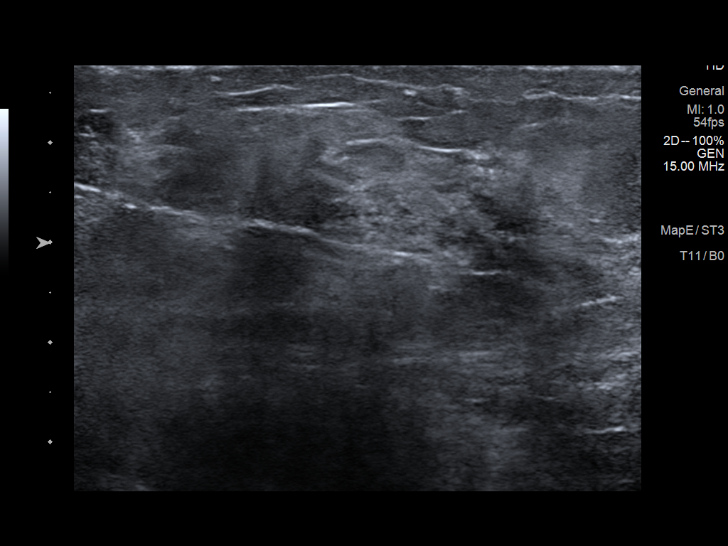
[im 7/17]
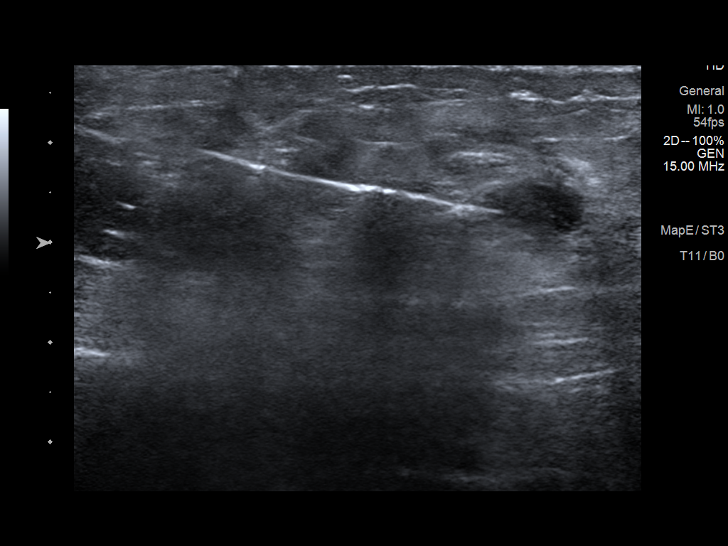
[im 8/17]
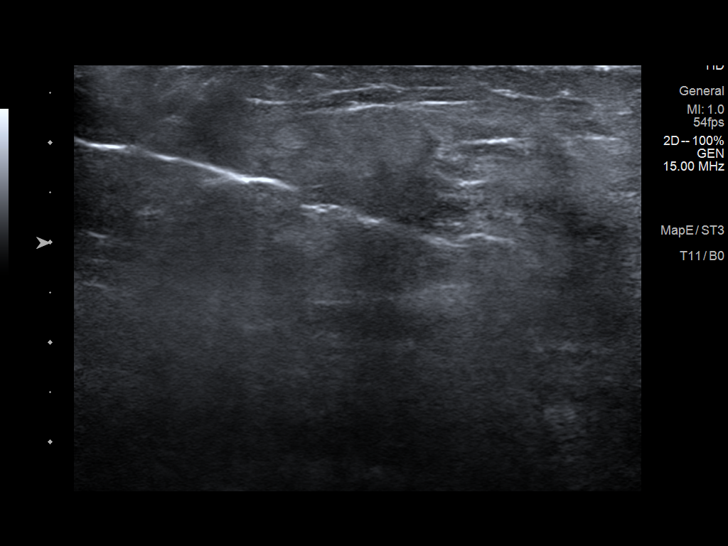
[im 10/17]
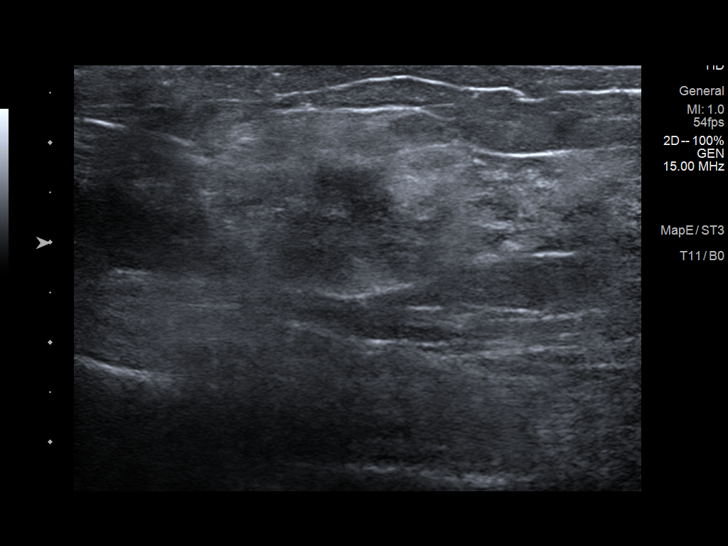
[im 11/17]
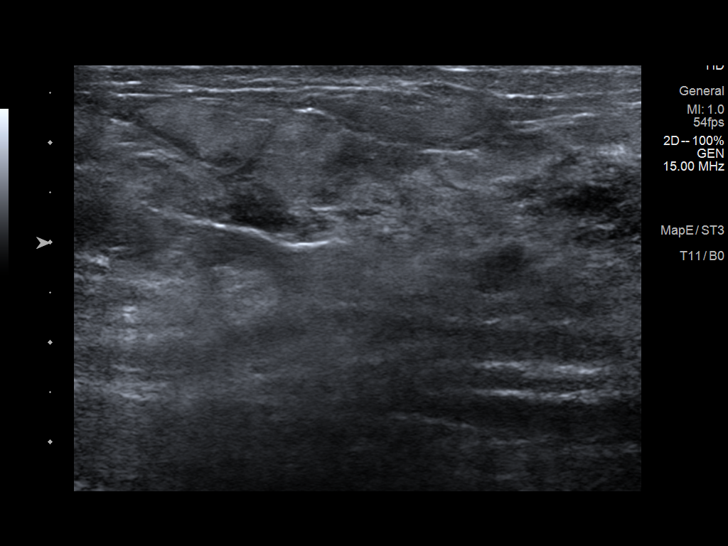
[im 13/17]
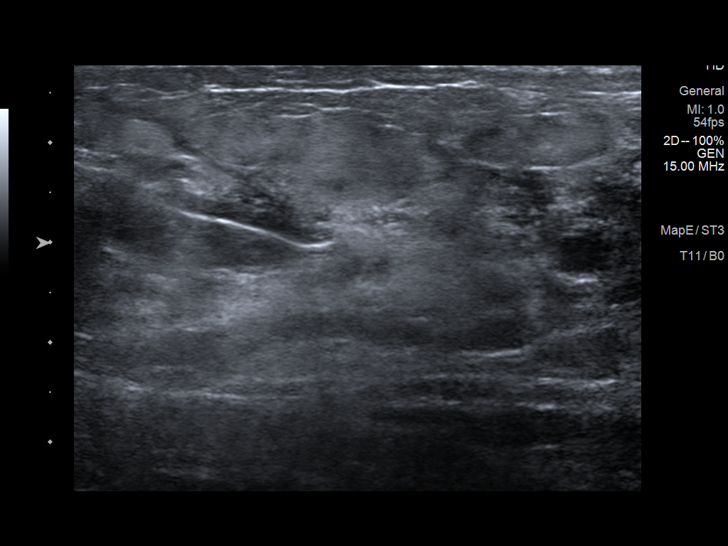
[im 14/17]
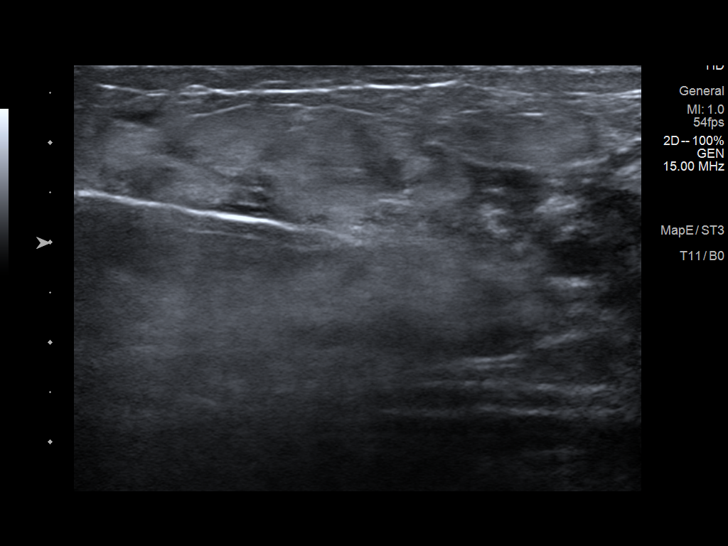
[im 15/17]
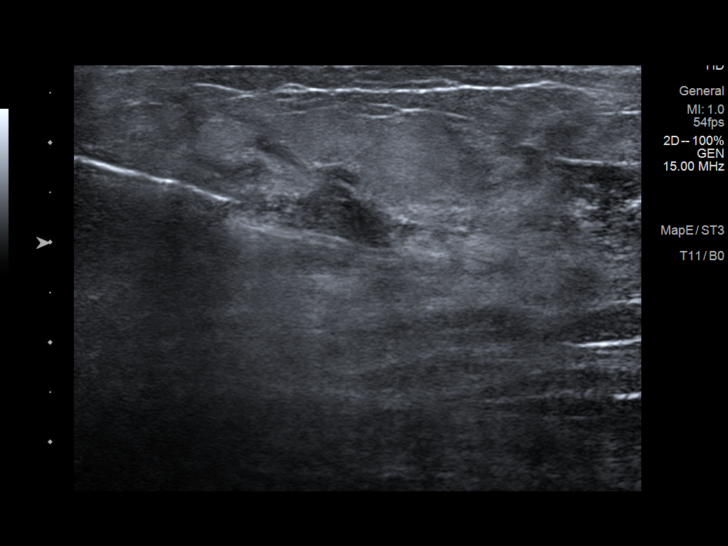
[im 17/17]
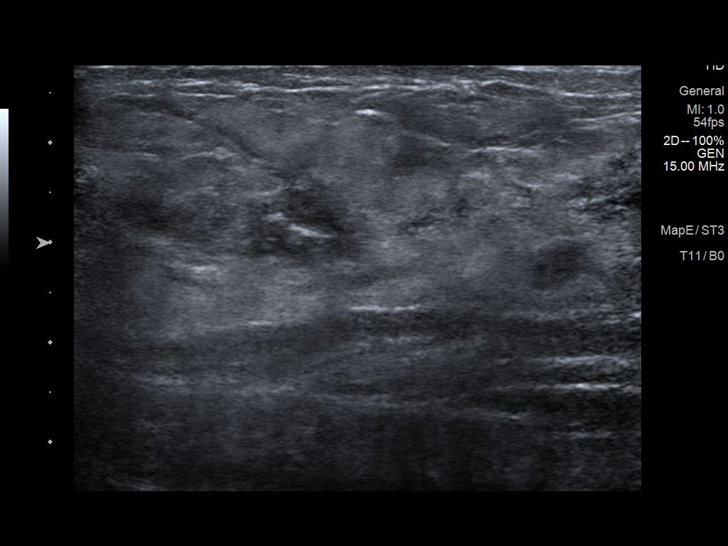

[12 of 17 positions shown; findings below may reference images not displayed]

PROCEDURE:
I met with the patient and we discussed the procedure of
ultrasound-guided biopsy, including benefits and alternatives. We
discussed the high likelihood of a successful procedure. We
discussed the risks of the procedure including infection, bleeding,
tissue injury, clip migration, and inadequate sampling. Informed
written consent was given. The usual time-out protocol was performed
immediately prior to the procedure.

Using sterile technique and 1% Lidocaine as local anesthetic, under
direct ultrasound visualization, a 12 gauge Joddsson biopsy
device was used to perform biopsy of the irregular hypoechoic mass
at the 2 o'clock axis, 4 cm from the nipple,using a lateral
approach. At the conclusion of the procedure, a ribbon shaped tissue
marker clip was deployed into the biopsy cavity.

Next, using sterile technique and 1% lidocaine as local anesthetic,
under direct ultrasound visualization, a 12 gauge Joddsson
biopsy device was used to perform biopsy of the oval hypoechoic mass
with anti-parallel orientation and indistinct margins located in the
left breast at the 1 o'clock axis, 3 cm from the nipple, using a
lateral approach. Today, this mass appeared more cystic but did not
completely collapse during the biopsy. At the conclusion of the
procedure, a coil shaped tissue marker clip was deployed into the
biopsy site.

Next, an attempt was made to biopsy the suspicious nodule in the
lower left axilla. After administration of lidocaine, the nodule
became completely obscured so biopsy was not possible.

Follow-up 2-view mammogram was performed and dictated separately.
IMPRESSION: Ultrasound-guided biopsy of the irregular hypoechoic mass at the 2
o'clock axis, 4 cm from the nipple.

Ultrasound-guided biopsy of the cystic-appearing mass at the 1
o'clock axis, 3 cm from the nipple.

No apparent complications.

## 2017-01-29 MED ORDER — METHOCARBAMOL 500 MG PO TABS: 500.0000 mg | ORAL_TABLET | Freq: Four times a day (QID) | ORAL | 3 refills | Status: DC

## 2017-01-29 MED ORDER — GABAPENTIN 300 MG PO CAPS: ORAL_CAPSULE | ORAL | 4 refills | Status: DC

## 2017-01-29 MED ORDER — INV-ASPIRIN/PLACEBO 300 MG TABS ALLIANCE A011502: 1.0000 | ORAL_TABLET | Freq: Every day | ORAL | 0 refills | Status: DC

## 2017-01-29 NOTE — Progress Notes (Signed)
[place]  [phone] [phone]   ID: Alison [name] DOB: 1961/02/13  MR#: [id number]  YYT#:[id number]  Patient Care Team: [name], MD as PCP - General (Internal Medicine) Erroll Luna, MD as Consulting Physician (General Surgery) Magrinat, Virgie Dad, MD as Consulting Physician (Oncology) Aloha Gell, MD as Consulting Physician (Obstetrics and Gynecology) Harriett Sine, MD as Consulting Physician (Dermatology) Benson Norway, RN as Registered Nurse (Oncology) Eppie Gibson, MD as Attending Physician (Radiation Oncology) PCP: [name], MD OTHER MD:  CHIEF COMPLAINT: Estrogen receptor positive breast cancer  CURRENT TREATMENT: Anastrozole, zolendronate  BREAST CANCER HISTORY: From the original intake note:  Alison [name] had routine screening mammography with tomography [facility] February 20 11/14/2014 showing a possible mass in the right breast. Diagnostic right mammography with right breast ultrasonography 11/01/2014 found the breast density to be category C. In the upper outer quadrant of the right breast there was a partially obscured mass which on physical exam was palpable as an area of thickening at the 10:00 position 8 cm from the nipple. Ultrasound showed multiple cysts in the upper outer right breast corresponding to the mass in question.  In November 2016 the patient felt a change in the upper left breast and brought it to her primary care physician's attention. Diagnostic left mammography with tomosynthesis and left breast ultrasonography at [facility] December 01/14/2015 found trabecular thickening throughout the left breast with skin thickening, but no circumscribed mass or suspicious calcifications. A rounded mass in the left axilla measured 6 mm. There was an additional mildly prominent lymph node in the left axilla which was what the patient had been palpating. Ultrasonography showed ill-defined  swelling throughout the inner left breast with overlying skin thickening.. At the 2:00 position 4 cm from the nipple there was an irregular hypoechoic mass measuring 2.3 cm. A second mass at the 1:00 area 3 cm from the nipple measured 1 cm. The distance between these 2 masses was 1.8 cm. There was also a round hypoechoic left axillary mass measuring 0.6 cm. Overall the was an area of 3.7 cm of mixed echogenicity corresponding to the area of palpable soft tissue swelling.   On 08/04/2015 the patient underwent biopsy of the mass at the 2:00 axis 4 cm from the nipple and a second biopsy was performed of the hypoechoic mass at the 1:00 position 3 cm from the nipple. Biopsy of the suspicious nodule in the lower left axilla was attempted but could not be completed as the mass became obscured after administration of lidocaine.  The pathology from both left breast biopsies 08/04/2015 ([id number]) showed invasive ductal carcinoma, grade 3. The prognostic profile obtained from the larger tumor showed estrogen receptor to be strongly positive at 95%, progesterone receptor positive at 30%, with an MIB-1 of 12%, and no HER-2 amplification, the signals ratio being 1.22 and the number per cell 2.20.  On 08/09/2015 the patient underwent biopsy of this suspicious left axillary lymph node noted above, and this showed ([id number]) metastatic carcinoma with extracapsular extension.  The patient's subsequent history is as detailed below  INTERVAL HISTORY: [name] returns today for follow-up of her estrogen receptor positive breast cancer. She continues on anastrozole, with good tolerance. Hot flashes and vaginal dryness are not a major issue. She never developed the arthralgias or myalgias that many patients can experience on this medication. She obtains it at a good price.  She is participating on several studies and today she is  being seeing as part of the [facility] a 11502 ABC trial (aspirin versus placebo for 5  years).   REVIEW OF SYSTEMS:  Alison [name] is tolerating treatment with no side effects that she is aware of and in particular denies any problems with gastritis reflux bruising or bleeding. There have been no tinnitus, headaches, or other side effects possibly attributed to the aspirin. She continues to have significant pain in the left chest wall and left arm. She discovered some Robaxin at home and she tells me that helps. Other pain medications we have tried have not been successful aside from narcotics. Methadone, made her sleepy and did not help. She says that Ultram also was not helpful. She is currently on gabapentin, Robaxin, and nonsteroidals. A detailed review of systems today was otherwise stable  PAST MEDICAL HISTORY: Past Medical History:  Diagnosis Date  . Asthma     seasonal asthma  . Breast cancer ([place])   . Complication of anesthesia 2007   aspiration pneumonia post hysterectomy.  Blood pressure would drop low- but no problems 01/2016- surgery  . History of blood transfusion   . History of mitral valve prolapse 1988   with palpitations  . History of radiation therapy 04/02/16- 05/21/16   Left Chest Wall, SCV, Axilla, IM nodes  . Hypertension   . Neuropathy ([place])    with chemo  . Osteopenia determined by x-ray   . Pneumonia    1980's and 1990's  . Raynaud's disease   . 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 Skin cancer 03/12/2014   basal- back    PAST SURGICAL HISTORY: Past Surgical History:  Procedure Laterality Date  . ABDOMINAL HYSTERECTOMY  2007   with BSO  . APPENDECTOMY  2007  . AXILLARY LYMPH NODE DISSECTION Left 02/14/2016   Procedure: LEFT AXILLARY LYMPH NODE DISSECTION;  Surgeon: [name], MD;  Location: [facility];  Service: General;  Laterality: Left;  . BREAST RECONSTRUCTION WITH PLACEMENT OF TISSUE EXPANDER AND FLEX HD (ACELLULAR HYDRATED DERMIS) Left 02/02/2016   Procedure: IMMEDIATE LEFT BREAST RECONSTRUCTION WITH PLACEMENT  OF TISSUE EXPANDER AND FLEX HD (ACELLULAR HYDRATED DERMIS);  Surgeon: [name], DO;  Location: [facility];  Service: Plastics;  Laterality: Left;  . BREAST SURGERY Left 02/02/16   mastectomy with reconstruction  . c-section  1991  . COLON RESECTION SIGMOID  12/02/2012   [facility]   . HERNIA REPAIR  2007  . LAPAROSCOPIC ENDOMETRIOSIS FULGURATION  1984, 1989  . Port- a- cath placement  07/2015  . PORT-A-CATH REMOVAL Right 02/02/2016   Procedure: REMOVAL PORT-A-CATH;  Surgeon: [name], MD;  Location: [facility];  Service: General;  Laterality: Right;  . RADIOACTIVE SEED GUIDED AXILLARY SENTINEL LYMPH NODE Left 02/02/2016   Procedure: LEFT BREAST MASTECTOMY AND RADIOACTIVE SEED GUIDED LEFT SENTINEL LYMPH NODE BIOPSY ;  Surgeon: [name], MD;  Location: [place] [facility];  Service: General;  Laterality: Left;    FAMILY HISTORY Family History  Problem Relation Age of Onset  . Lung cancer Father    the patient's father died from lung cancer at the age of 68 in the setting of tobacco abuse. The patient's mother died at the age of 43 with pancreatic cancer. The patient had no siblings. There is no history of breast or ovarian cancer in the family.  GYNECOLOGIC HISTORY:  No LMP recorded. Patient has had a hysterectomy. Menarche age 1, first live birth age 3. The patient is  GX P1. She is status post total hysterectomy with bilateral salpingo-oophorectomy. She has been on hormone replacement since that time and is tapering to off at the time of this dictation.   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 She used to work at [employer]. Her husband [name] works for [employer] division at a [employer] their son [name] is currently at home.    ADVANCED DIRECTIVES: Not in place   HEALTH MAINTENANCE: Social History  Substance Use Topics  . Smoking status: Former Smoker    Packs/day: 1.00    Years: 10.00    Types:  Cigarettes    Quit date: 11/25/2008  . Smokeless tobacco: Never Used     Comment: on and off smoker never consistent  . Alcohol use 2.4 oz/week    4 Glasses of wine per week     Colonoscopy: April 2014/ Raynaldo Opitz at [facility]  PAP: s/p hysterectomy  Bone density: August 2016/ osteopenia  Lipid panel:  Allergies  Allergen Reactions  . Sulfa Antibiotics Other (See Comments)    Blisters in mouth  . Zofran [Ondansetron Hcl] Other (See Comments)    Severe Headaches     Current Outpatient Prescriptions  Medication Sig Dispense Refill  . anastrozole (ARIMIDEX) 1 MG tablet Take 1 mg by mouth daily.    . cetirizine (ZYRTEC) 10 MG tablet Take 10 mg by mouth daily.    . Cholecalciferol (VITAMIN D3) 2000 UNITS capsule Take 2,000 Units by mouth daily.     . Fenofibric Acid 105 MG TABS Take 1 tablet by mouth daily.    . fluticasone (FLONASE) 50 MCG/ACT nasal spray Place 2 sprays into both nostrils daily. Reported on 11/18/2015    . gabapentin (NEURONTIN) 300 MG capsule Take 1 capsule twice a day, 3 capsules at bedtime. 270 capsule 0  . gabapentin (NEURONTIN) 300 MG capsule TAKE ONE CAPSULE THREE TIMES DAILY 270 capsule 0  . Investigational aspirin/placebo 300 MG tablet [facility] [id number] Take 1 tablet by mouth daily. Take with food or a full glass of water.  Do not crush enteric coated tablets. 180 tablet 0  . levocetirizine (XYZAL) 5 MG tablet Take 5 mg by mouth every evening.    . lidocaine (LIDODERM) 5 % Place 1 patch onto the skin daily. Remove & Discard patch within 12 hours or as directed by MD 30 patch 0  . LORazepam (ATIVAN) 0.5 MG tablet Take 0.5 mg by mouth at bedtime.    . methadone (DOLOPHINE) 5 MG tablet Take 1 tablet (5 mg total) by mouth every 8 (eight) hours. 90 tablet 0  . Multiple Vitamin (MULTIVITAMIN) tablet Take 1 tablet by mouth daily.    Marland Kitchen olmesartan (BENICAR) 20 MG tablet TAKE ONE TABLET EACH DAY 90 tablet 0  . omega-3 acid ethyl esters (LOVAZA) 1 g capsule Take 1 g by mouth 2  (two) times daily.    . polyethylene glycol (MIRALAX / GLYCOLAX) packet Take 17 g by mouth daily as needed for mild constipation. Reported on 01/06/2016    . Probiotic Product (PROBIOTIC DAILY PO) Take 1 capsule by mouth daily. [place] prochlorperazine (COMPAZINE) 10 MG tablet Take 1 tablet (10 mg total) by mouth every 6 (six) hours as needed (Nausea or vomiting). 30 tablet 2  . traZODone (DESYREL) 50 MG tablet Take 3 tablets (150 mg total) by mouth at bedtime. 90 tablet 0  . zolpidem (AMBIEN) 5 MG tablet TAKE  ONE TABLET AT BEDTIME AS NEEDED FORSLEEP 30 tablet 0   No current facility-administered medications for this visit.     OBJECTIVE: Middle-aged white woman In no acute distress Vitals:   01/29/17 1104  BP: 121/64  Pulse: 92  Resp: 18  Temp: 98.2 F (36.8 C)     Body mass index is 26.92 kg/m.    ECOG FS:1 - Symptomatic but completely ambulatory  Sclerae unicteric, pupils round and equal Oropharynx clear and moist No cervical or supraclavicular adenopathy Lungs no rales or rhonchi Heart regular rate and rhythm Abd soft, nontender, positive bowel sounds MSK no focal spinal tenderness, no upper extremity lymphedema Neuro: nonfocal, well oriented, appropriate affect Breasts: The right breast is unremarkable. The left breast is status post mastectomy. I do not palpate any evidence of focal recurrence. The whole area however is irritated and uncomfortable to palpation. There is no significant swelling or erythema. Both axillae are benign.  LAB RESULTS:  CMP     Component Value Date/Time   NA 141 01/16/2017 1057   K 4.7 01/16/2017 1057   CL 110 02/15/2016 0330   CO2 25 01/16/2017 1057   GLUCOSE 109 01/16/2017 1057   BUN 11.3 01/16/2017 1057   CREATININE 1.0 01/16/2017 1057   CALCIUM 10.1 01/16/2017 1057   PROT 7.7 01/16/2017 1057   ALBUMIN 4.4 01/16/2017 1057   AST 28 01/16/2017 1057   ALT 22 01/16/2017 1057   ALKPHOS 62  01/16/2017 1057   BILITOT 0.31 01/16/2017 1057   GFRNONAA >60 02/15/2016 0330   GFRAA >60 02/15/2016 0330    INo results found for: SPEP, UPEP  Lab Results  Component Value Date   WBC 4.5 01/29/2017   NEUTROABS 2.9 01/29/2017   HGB 12.7 01/29/2017   HCT 37.1 01/29/2017   MCV 100.4 01/29/2017   PLT 386 01/29/2017      Chemistry      Component Value Date/Time   NA 141 01/16/2017 1057   K 4.7 01/16/2017 1057   CL 110 02/15/2016 0330   CO2 25 01/16/2017 1057   BUN 11.3 01/16/2017 1057   CREATININE 1.0 01/16/2017 1057      Component Value Date/Time   CALCIUM 10.1 01/16/2017 1057   ALKPHOS 62 01/16/2017 1057   AST 28 01/16/2017 1057   ALT 22 01/16/2017 1057   BILITOT 0.31 01/16/2017 1057       No results found for: LABCA2  No components found for: LABCA125  No results for input(s): INR in the last 168 hours.  Urinalysis    Component Value Date/Time   COLORURINE YELLOW 11/12/2015 [place] 11/12/2015 0837   LABSPEC 1.005 05/15/2016 1405   PHURINE 6.0 05/15/2016 1405   PHURINE 6.5 11/12/2015 0837   GLUCOSEU Negative 05/15/2016 1405   HGBUR Small 05/15/2016 1405   HGBUR MODERATE (A) 11/12/2015 0837   BILIRUBINUR Negative 05/15/2016 1405   KETONESUR Negative 05/15/2016 1405   KETONESUR NEGATIVE 11/12/2015 0837   PROTEINUR Negative 05/15/2016 1405   PROTEINUR NEGATIVE 11/12/2015 0837   UROBILINOGEN 0.2 05/15/2016 1405   NITRITE Negative 05/15/2016 1405   NITRITE NEGATIVE 11/12/2015 0837   LEUKOCYTESUR Moderate 05/15/2016 1405    STUDIES: Bone density January 2018 shows T score of -1.7  PROTOCOLS: BWEL, ABC and PALLAS, taxane study  ASSESSMENT: 56 y.o. [place] woman status post biopsy of 2 separate masses in the left breast upper outer quadrant 08/04/2015, clinically mT2 N1, stage IIb/IIIa invasive ductal carcinoma, grade 3, the larger mass being estrogen  and progesterone receptor positive, HER-2 negative, with an MIB-1 of 12%  (a) biopsy  of a left axillary lymph node 08/09/2015 was positive, with extracapsular extension  (1) neoadjuvant chemotherapy started 08/25/2015 consisting of doxorubicin and cyclophosphamide in dose dense fashion 4, completed 10/06/2015,  followed by weekly paclitaxel 10 completed 12/23/2015  (a) final 2 planned cycles of weekly paclitaxel omitted because of neuropathy concerns  (2) status post left mastectomy with targeted axillary dissection 02/02/2016 for a ypT2 ypN2a, stage IIIa invasive ductal carcinoma, grade 2, with negative margins, repeat prognostic panel showing estrogen receptor positive, but progesterone receptor and HER-2 negative  (a) completion axillary lymph node dissection 02/14/2016 showed an additional 2 out of 8 lymph nodes to be involved (final count 6 out of 10 lymph nodes positive  (b) expander placement at the time of left mastectomy  (3) adjuvant capecitabine starting concurrently with radiation--discontinued 05/21/2016  (4) adjuvant  radiation started 04/02/2016, completed 05/21/2016  (5) anastrozole started October 2017  (a) bone density August 2016 showed osteopenia  (b) repeat bone density 09/07/2016 shows a T score of -1.7.  (c) the patient is status post total abdominal hysterectomy with bilateral salpingo-oophorectomy.  (6) postoperative pain: Starting OxyContin 20 mg twice a day with oxycodone as needed for breakthrough pain 03/02/2016  (a) failed transition to tramadol, gabapentin, naproxen   (b) started methadone 5 mg TID as of 12/20/2016-- not tolerated  (c) robaxin added June 2018 to NSAIDS and tylenol  (7) Research:  (a) B-WEL study ([facility] [id number])--enrolled in Arm 1 (received education)  (b) PALLAS AFT-05 study: randomized to treatment, started palbociclib 07/26/2016   (i) dose reduced to 100 mg, then to 75 mg because of cytopenias   (ii) went off study study May 2018 because of persistent cytopenias  (c) taxane study 12/20/2016  (d) aspirin  study    PLAN: Alison [name] is tolerating the aspirin with no side effects that she is aware of.  She is taking nonsteroidals for her left chest wall and axillary pain. That is not really holding her.  She was sleepy with the methadone and she did not feel it was helpful. She tells me the tramadol never was helpful.  She is taking gabapentin 2 tablets in the morning and 4 at night. I think we could increase to 2 tablets twice a day and then for at night and see if that is helpful.  We are adding Robaxin since she feels that has helped her.  We are trying to get her into a pain clinic to see if they have a better solution to her chronic postoperative pain problem  Otherwise she will return to see me as scheduled for her next study visit.  :Chauncey Cruel, MD   01/29/2017 11:13 AM

## 2017-01-29 NOTE — Progress Notes (Signed)
01/29/2017  AFT-05 PALLAS - Cycle 7, Day 13 (interim visit) Completed Cycle 6 anti-hormone therapy diary was returned by patient today. The next study visit, Cycle 9, is expected to occur on 03/19/2017, within the +7 day window of the 03/14/2017 actual visit date, due to patient's vacation plans and physician's out of office schedule.  [facility] [id number] ABC - Month 6 visit Patient returned completed medication logs for December 2017 through June 2018. Patient was given blank copies of medication logs for June through December, and will continue daily dosing of study medication aspirin 360m/placebo, one tablet daily. Patient returned the two previously dispensed bottles of study medication from the baseline visit, with one empty bottle and one partial bottle with 12 tablets remaining. Two new bottles of study medication (aspirin/placebo x 90 tablets per bottle) were dispensed by pharmacist, BRaul Del and were given to the patient for continued dosing through next study visit in late November. Based on lab results review and physical exam, Dr. MJana Hakimconfirmed that the patient met criteria for continuing study treatment. Patient denies any symptoms of nosebleeds, blood in her urine or stool, and reports no symptoms of heartburn or gastritis. There is no documentation of or evidence for upper or lower gastrointestinal bleeding. There has been no documentation of intracranial hemorrhage and she denies any episodes of unusual bruising.  Patient reports needing to take ibuprofen less than once per week since study start, up until approximately three weeks ago when she began taking Advil daily.   Cindy S. SBrigitte PulseBSN, RN, CSugarmill Woods6/12/2016 2:14 PM   Adverse Event Log Protocol: [facility] [id number] study Month 6 visit: 08/08/16 (study drug start date) - 01/29/2017 Event Grade Attribution to aspirin/placebo  Leukopenia 3 Unrelated  Neutropenia 3 Unrelated  Elevated creatinine 2 Unrelated  Fatigue 3 Unlikely   Ingrown toenails 2 Unrelated  Non-reportable grade 1 AEs, other than solicited: - Anemia - Elevated AST - Osteopenia - Weight loss Cindy S. SBrigitte PulseBSN, RN, COna7/16/2018 10:38 AM

## 2017-01-30 ENCOUNTER — Other Ambulatory Visit: Payer: Self-pay

## 2017-01-30 MED ORDER — OLMESARTAN MEDOXOMIL 20 MG PO TABS: ORAL_TABLET | ORAL | 0 refills | Status: DC

## 2017-01-31 ENCOUNTER — Other Ambulatory Visit: Payer: Self-pay | Admitting: *Deleted

## 2017-01-31 ENCOUNTER — Other Ambulatory Visit: Payer: Self-pay | Admitting: Oncology

## 2017-01-31 ENCOUNTER — Telehealth: Payer: Self-pay | Admitting: *Deleted

## 2017-01-31 MED ORDER — INV-ASPIRIN/PLACEBO 300 MG TABS ALLIANCE A011502: 1.0000 | ORAL_TABLET | Freq: Every day | ORAL | 0 refills | Status: DC

## 2017-02-04 ENCOUNTER — Ambulatory Visit: Payer: 59 | Admitting: Oncology

## 2017-02-04 ENCOUNTER — Other Ambulatory Visit: Payer: 59

## 2017-02-07 ENCOUNTER — Ambulatory Visit: Payer: 59 | Attending: General Surgery | Admitting: Physical Therapy

## 2017-02-07 DIAGNOSIS — M25612 Stiffness of left shoulder, not elsewhere classified: Secondary | ICD-10-CM | POA: Insufficient documentation

## 2017-02-07 DIAGNOSIS — M5440 Lumbago with sciatica, unspecified side: Secondary | ICD-10-CM | POA: Diagnosis present

## 2017-02-07 DIAGNOSIS — G8929 Other chronic pain: Secondary | ICD-10-CM | POA: Diagnosis present

## 2017-02-07 DIAGNOSIS — R293 Abnormal posture: Secondary | ICD-10-CM

## 2017-02-07 DIAGNOSIS — M25512 Pain in left shoulder: Secondary | ICD-10-CM | POA: Diagnosis present

## 2017-02-07 DIAGNOSIS — I890 Lymphedema, not elsewhere classified: Secondary | ICD-10-CM | POA: Diagnosis present

## 2017-02-07 NOTE — Therapy (Signed)
[place], [place], [address] Phone: [phone]   Fax:  [phone]  Physical Therapy Treatment  Patient Details  Name: Alison [name] MRN: [id number] Date of Birth: Oct 29, 1960 Referring Provider: Dr. Barry Dienes   Encounter Date: 02/07/2017      PT End of Session - 02/07/17 1724    Visit Number 26   Number of Visits 31   Date for PT Re-Evaluation 02/22/17   PT Start Time 1300   PT Stop Time 1345   PT Time Calculation (min) 45 min   Activity Tolerance Patient tolerated treatment well   Behavior During Therapy [facility] for tasks assessed/performed      Past Medical History:  Diagnosis Date  . Asthma     seasonal asthma  . Breast cancer ([place])   . Complication of anesthesia 2007   aspiration pneumonia post hysterectomy.  Blood pressure would drop low- but no problems 01/2016- surgery  . History of blood transfusion   . History of mitral valve prolapse 1988   with palpitations  . History of radiation therapy 04/02/16- 05/21/16   Left Chest Wall, SCV, Axilla, IM nodes  . Hypertension   . Neuropathy ([place])    with chemo  . Osteopenia determined by x-ray   . Pneumonia    1980's and 1990's  . Raynaud's disease   . 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 Skin cancer 03/12/2014   basal- back    Past Surgical History:  Procedure Laterality Date  . ABDOMINAL HYSTERECTOMY  2007   with BSO  . APPENDECTOMY  2007  . AXILLARY LYMPH NODE DISSECTION Left 02/14/2016   Procedure: LEFT AXILLARY LYMPH NODE DISSECTION;  Surgeon: [name], MD;  Location: [place];  Service: General;  Laterality: Left;  . BREAST RECONSTRUCTION WITH PLACEMENT OF TISSUE EXPANDER AND FLEX HD (ACELLULAR HYDRATED DERMIS) Left 02/02/2016   Procedure: IMMEDIATE LEFT BREAST RECONSTRUCTION WITH PLACEMENT OF TISSUE EXPANDER AND FLEX HD (ACELLULAR HYDRATED DERMIS);  Surgeon: [name], DO;  Location: [facility];  Service: Plastics;  Laterality: Left;  . BREAST SURGERY Left 02/02/16   mastectomy with reconstruction  . c-section  1991  . COLON RESECTION SIGMOID  12/02/2012   [facility]   . HERNIA REPAIR  2007  . LAPAROSCOPIC ENDOMETRIOSIS FULGURATION  1984, 1989  . Port- a- cath placement  07/2015  . PORT-A-CATH REMOVAL Right 02/02/2016   Procedure: REMOVAL PORT-A-CATH;  Surgeon: [name], MD;  Location: [place];  Service: General;  Laterality: Right;  . RADIOACTIVE SEED GUIDED AXILLARY SENTINEL LYMPH NODE Left 02/02/2016   Procedure: LEFT BREAST MASTECTOMY AND RADIOACTIVE SEED GUIDED LEFT SENTINEL LYMPH NODE BIOPSY ;  Surgeon: [name], MD;  Location: [facility];  Service: General;  Laterality: Left;    There were no vitals filed for this visit.      Subjective Assessment - 02/07/17 1305    Subjective Pt said she had to get off the Ibrance study because it made her blood work messed up. She is still not feeling that great.    Pertinent History Breast cancer diagnosed November2016 She had chemotherapy. but did have some neuropathy so it had to be stopped. On June 8, she had a mastectomy with immedicate expander placement and with first set of lymphnodes removed, On June 20 she went for complete axillary lymph node dissection. She says she has had always been "full necked" but has neck  and upper body fullness since the cancer was diagnosed. She reports she has osteopenia in low back and has had a fracture that was discovered in 2014 when she had a Dexa scan.   Patient Stated Goals to decrease the edema   Currently in Pain? Yes   Pain Score 4    Pain Location Axilla   Pain Orientation Left   Pain Descriptors / Indicators --  Fullness    Pain Type Chronic pain   Pain Radiating Towards arm neck shouler    Pain Onset More than a month ago   Pain Frequency Occasional   Aggravating Factors  weather makes it worse    Pain Relieving Factors has found  that Robaxin makes it better                          [facility] Adult PT Treatment/Exercise - 02/07/17 0001      Shoulder Exercises: Sidelying   External Rotation AROM;Left;20 reps   ABduction AROM;Left;10 reps  painful    Other Sidelying Exercises small circles with hand pointed to ceiling      Manual Therapy   Manual Lymphatic Drainage (MLD) in supine short neck, superficial and deep abdominals, right axillary nodes, anterior interaxillary anastamosis, anterior and posterior neck and Lt inguinal nodes and Lt axillo-inguinal anastomosis briefly, then to right sidelying for posterior interaxillary anastamosis.    Passive ROM to left shoulder as tolerated    Kinesiotex Edema     Kinesiotix   Edema skinkote and kinsesiotap in fan shape along left lateral trunk and axilla                         Long Term Clinic Goals - 02/07/17 1725      CC Long Term Goal  #1   Title Patient will report a decrease in left shoulder pain by 75% so she can perform daily activities with greater ease   Baseline Pain had gotten better, but has increased pain and swelling    Time 8   Period Weeks   Status On-going     CC Long Term Goal  #2   Title Reduce circumference measurement at 6 cm superior to sternal notch to 50 cm or less.   Baseline 54.4 at eval, 52 cm at 06/26/2016 . 51 cm on 07/25/2016, 52.5 at 08/30/2016, 51.5 on 10/16/2016   Status Not Met     CC Long Term Goal  #3   Title Patient will be independent in self manual lymph drainage.   Baseline pt is now using the Flexitouch    Status Achieved     CC Long Term Goal  #4   Title Patient will increase right shoulder flexion AROM to 140 degrees without any pain for improved ability to perform ADLs.   Baseline 145 on 10/31, 130 degrees 07/25/2016  limited by pain  170 degrees    Status Achieved     CC Long Term Goal  #5   Title Patient will utilize chip pack for at least 2 hours per day at home to assess whether  compression garment would be beneficial.   Status Achieved     CC Long Term Goal  #6   Title Pt will be independent in strenthening program    Baseline Pt ability to follow through with exercise is limited by pain    Status On-going  Plan - 02/07/17 1726    Clinical Impression Statement Pt continues to have pain and fullness in left axilla.  She reports relief from manual lymph drainage and kinesiotape.  Pt plans to have right mastectomy and more reconstruction surgery to right breast in early August and hopes to continue her PT treatment at least until then.    Rehab Potential Fair   Clinical Impairments Affecting Rehab Potential chemotherapy with neuropathy    PT Frequency 1x / week   PT Duration 8 weeks   PT Treatment/Interventions ADLs/Self Care Home Management;Manual lymph drainage;Compression bandaging;Scar mobilization;Passive range of motion;Patient/family education;Taping;Manual techniques;Therapeutic exercise;Therapeutic activities;Orthotic Fit/Training;DME Instruction   PT Next Visit Plan manual lymph drainage and manual techniques to manage pain update HEP  with progression as able     Consulted and Agree with Plan of Care Patient      Patient will benefit from skilled therapeutic intervention in order to improve the following deficits and impairments:  Decreased range of motion, Impaired UE functional use, Decreased activity tolerance, Decreased knowledge of precautions, Decreased skin integrity, Impaired perceived functional ability, Pain, Decreased knowledge of use of DME, Increased edema, Postural dysfunction, Decreased strength, Impaired sensation, Decreased scar mobility  Visit Diagnosis: Abnormal posture  Lymphedema, not elsewhere classified  Stiffness of left shoulder joint  Chronic left shoulder pain     Problem List Patient Active Problem List   Diagnosis Date Noted  . Osteopenia determined by x-ray 09/20/2016  . Primary malignant neoplasm  of left breast with stage 2 nodal metastasis per American Joint Committee on Cancer 7th edition (N2) ([place]) 02/14/2016  . 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 Insomnia 09/01/2015  . Malignant neoplasm of upper-outer quadrant of left breast in female, estrogen receptor positive ([place]) 08/08/2015   Donato Heinz. Owens Shark PT  Norwood Levo 02/07/2017, 5:29 PM  [place], [place], [address] Phone: [phone]   Fax:  [phone]  Name: SHARIN ALTIDOR MRN: [id number] Date of Birth: 1960/10/01

## 2017-02-08 ENCOUNTER — Other Ambulatory Visit: Payer: 59

## 2017-02-14 ENCOUNTER — Other Ambulatory Visit: Payer: Self-pay | Admitting: Internal Medicine

## 2017-02-14 ENCOUNTER — Encounter: Payer: 59 | Admitting: Physical Therapy

## 2017-02-14 DIAGNOSIS — M545 Low back pain: Secondary | ICD-10-CM

## 2017-02-15 ENCOUNTER — Ambulatory Visit
Admission: RE | Admit: 2017-02-15 | Discharge: 2017-02-15 | Disposition: A | Discharge status: Home / Self Care | Payer: 59 | Source: Ambulatory Visit | Attending: Internal Medicine | Admitting: Internal Medicine

## 2017-02-15 ENCOUNTER — Telehealth: Payer: Self-pay | Admitting: *Deleted

## 2017-02-15 DIAGNOSIS — M545 Low back pain: Secondary | ICD-10-CM

## 2017-02-15 NOTE — Telephone Encounter (Signed)
This RN spoke with pt per her call stating she was contacted by Dr Osborne Casco " about the abnormal CT scan results and that I needed to see Dr [name] "  This RN informed pt Dr Jannifer Rodney has spoken with Dr Osborne Casco and we would like to see her this Monday ( appointment available at 9 am ) - with Magda Paganini verifying date and time.  This RN did inquire that beside the increased pain any symptoms of leg numbness or weakness - and or changes in bowel or bladder habits with Magda Paganini stating no to all except pain.  Questions answered appropriately by this RN and patient has no further questions.

## 2017-02-18 ENCOUNTER — Telehealth: Payer: Self-pay | Admitting: Pharmacist

## 2017-02-18 ENCOUNTER — Ambulatory Visit (HOSPITAL_BASED_OUTPATIENT_CLINIC_OR_DEPARTMENT_OTHER): Payer: 59 | Admitting: Oncology

## 2017-02-18 VITALS — BP 133/66 | HR 106 | Temp 97.7°F | Resp 18 | Ht 62.0 in | Wt 153.8 lb

## 2017-02-18 DIAGNOSIS — C50912 Malignant neoplasm of unspecified site of left female breast: Secondary | ICD-10-CM

## 2017-02-18 DIAGNOSIS — C50412 Malignant neoplasm of upper-outer quadrant of left female breast: Secondary | ICD-10-CM

## 2017-02-18 DIAGNOSIS — Z79811 Long term (current) use of aromatase inhibitors: Secondary | ICD-10-CM | POA: Diagnosis not present

## 2017-02-18 DIAGNOSIS — Z170 Estrogen receptor positive status [ER+]: Principal | ICD-10-CM

## 2017-02-18 DIAGNOSIS — C7951 Secondary malignant neoplasm of bone: Secondary | ICD-10-CM

## 2017-02-18 DIAGNOSIS — C779 Secondary and unspecified malignant neoplasm of lymph node, unspecified: Secondary | ICD-10-CM | POA: Diagnosis not present

## 2017-02-18 MED ORDER — LORAZEPAM 0.5 MG PO TABS: 0.5000 mg | ORAL_TABLET | Freq: Every day | ORAL | 3 refills | Status: DC

## 2017-02-18 MED ORDER — PROCHLORPERAZINE MALEATE 10 MG PO TABS: 10.0000 mg | ORAL_TABLET | Freq: Four times a day (QID) | ORAL | 2 refills | Status: DC | PRN

## 2017-02-18 MED ORDER — OXYCODONE HCL 15 MG PO TABS: 15.0000 mg | ORAL_TABLET | ORAL | 0 refills | Status: DC | PRN

## 2017-02-18 MED ORDER — ABEMACICLIB 100 MG PO TABS: 100.0000 mg | ORAL_TABLET | Freq: Two times a day (BID) | ORAL | 3 refills | Status: DC

## 2017-02-18 MED ORDER — TRAZODONE HCL 50 MG PO TABS: 150.0000 mg | ORAL_TABLET | Freq: Every day | ORAL | 0 refills | Status: DC

## 2017-02-18 MED ORDER — ABEMACICLIB 100 MG PO TABS: 100.0000 mg | ORAL_TABLET | Freq: Every day | ORAL | 3 refills | Status: DC

## 2017-02-18 MED ORDER — OXYCODONE HCL ER 40 MG PO T12A
40.0000 mg | EXTENDED_RELEASE_TABLET | Freq: Two times a day (BID) | ORAL | 0 refills | Status: DC
Start: 2017-02-18 — End: 2017-03-14

## 2017-02-18 NOTE — Telephone Encounter (Signed)
Oral Oncology Patient Advocate Encounter  Received notification from [place] that prior authorization for Verzenio is required.  PA submitted on CoverMyMeds Key [id number]   Status is pending  Oral Oncology Clinic will continue to follow.  Fabio Asa. Melynda Keller, [place] [facility] Patient Advocate [phone] 02/18/2017 11:58 AM

## 2017-02-18 NOTE — Telephone Encounter (Signed)
Oral Chemotherapy Pharmacist Encounter  Received new prescription for Verzenio for the treatment of metastatic breast cancer in conjunction with Faslodex. 01/29/17 labs reviewed, OK for treatment Current medication list in Epic assessed, no DDIs with Verzenio identified.  I spoke with patient for overview of new oral chemotherapy medication: Verzenio. Pt is doing well. The prescriptions have been sent to [facility] for benefit analysis and approval.   Counseled patient on administration, dosing, side effects, safe handling, and monitoring. Patient will take 100mg  tablet by mouth twice daily without regard to food. She knows to avoid grapefruit and grapefruit juice while on treatment with Verzenio. Patient understands her Verzenio dose may increase in time depending on her toleration up to 150mg  twice daily.  Side effects include but not limited to: diarrhea, N/V, fatigue, headache, and decreased blood counts. Patient noted she deals with frequent constipation due to opioid pain medications. She will get some over the counter anti-diarrheal on hand for if she develops diarrhea, but will not start taking yet..  Alison [name] voiced understanding and appreciation.   All questions answered.  Will follow up with patient regarding insurance and pharmacy.   Oral Oncology Clinic will continue to follow.  Thank you,  Johny Drilling, PharmD, BCPS, BCOP 02/18/2017  10:51 AM Oral Oncology Clinic [phone]

## 2017-02-18 NOTE — Progress Notes (Signed)
Alison [name]  [phone] [phone]   ID: Alison [name] DOB: [birth year]  MR#: [id number]  HTD#:[id number]  Patient Care Team: [name], MD as PCP - General (Internal Medicine) Erroll Luna, MD as Consulting Physician (General Surgery) Magrinat, Virgie Dad, MD as Consulting Physician (Oncology) Aloha Gell, MD as Consulting Physician (Obstetrics and Gynecology) Harriett Sine, MD as Consulting Physician (Dermatology) Benson Norway, RN as Registered Nurse (Oncology) Eppie Gibson, MD as Attending Physician (Radiation Oncology) PCP: [name], MD OTHER MD:  CHIEF COMPLAINT: Estrogen receptor positive breast cancer  CURRENT TREATMENT: Anastrozole, zolendronate  INTERVAL HISTORY: Alison [name] returns today for follow-up of her estrogen receptor positive breast cancer. After her recent visit here, she was seen by Dr. Serita Grammes back for evaluation of low back pain. He obtained a CT scan of the lumbar spine, without contrast. This found numerous lytic and sclerotic bone lesions throughout the lumbosacral spine involving the vertebral bodies, most consistent with metastatic disease. There was a mild chronic L1 vertebral body compression fracture with 20% height loss, unchanged compared to April 2016.  The patient is here today to discuss those findings.   REVIEW OF SYSTEMS:  Alison [name] continues on anastrozole, with good tolerance. Her back pain is not well controlled on her current medications. She was doing much better some months ago when she was taking OxyContin 40 mg twice daily and oxycodone for breakthrough. She was able to have a good bowel movements by taking MiraLAX and stool softeners. She feels better when she walks. She denies weakness or sensory levels. There has been no change in bowel or bladder habits.   BREAST CANCER HISTORY: From the original intake note:  Alison [name] had routine screening mammography with tomography [facility] February 20 11/14/2014 showing a possible mass in the right breast. Diagnostic right mammography with right breast ultrasonography 11/01/2014 found the breast density to be category C. In the upper outer quadrant of the right breast there was a partially obscured mass which on physical exam was palpable as an area of thickening at the 10:00 position 8 cm from the nipple. Ultrasound showed multiple cysts in the upper outer right breast corresponding to the mass in question.  In November 2016 the patient felt a change in the upper left breast and brought it to her primary care physician's attention. Diagnostic left mammography with tomosynthesis and left breast ultrasonography at the [facility] December 01/14/2015 found trabecular thickening throughout the left breast with skin thickening, but no circumscribed mass or suspicious calcifications. A rounded mass in the left axilla measured 6 mm. There was an additional mildly prominent lymph node in the left axilla which was what the patient had been palpating. Ultrasonography showed ill-defined swelling throughout the inner left breast with overlying skin thickening.. At the 2:00 position 4 cm from the nipple there was an irregular hypoechoic mass measuring 2.3 cm. A second mass at the 1:00 area 3 cm from the nipple measured 1 cm. The distance between these 2 masses was 1.8 cm. There was also a round hypoechoic left axillary mass measuring 0.6 cm. Overall the was an area of 3.7 cm of mixed echogenicity corresponding to the area of palpable soft tissue swelling.   On 08/04/2015 the patient underwent biopsy of the mass at the 2:00 axis 4 cm from the nipple and a second biopsy was performed of the hypoechoic mass at the 1:00 position 3 cm from the nipple. Biopsy of the suspicious nodule in the lower  left axilla was attempted but could not be completed as the mass became obscured after administration of lidocaine.  The pathology from both left breast  biopsies 08/04/2015 ([id number]) showed invasive ductal carcinoma, grade 3. The prognostic profile obtained from the larger tumor showed estrogen receptor to be strongly positive at 95%, progesterone receptor positive at 30%, with an MIB-1 of 12%, and no HER-2 amplification, the signals ratio being 1.22 and the number per cell 2.20.  On 08/09/2015 the patient underwent biopsy of this suspicious left axillary lymph node noted above, and this showed ([id number]) metastatic carcinoma with extracapsular extension.  The patient's subsequent history is as detailed below    PAST MEDICAL HISTORY: Past Medical History:  Diagnosis Date  . Asthma     seasonal asthma  . Breast cancer ([place])   . Complication of anesthesia 2007   aspiration pneumonia post hysterectomy.  Blood pressure would drop low- but no problems 01/2016- surgery  . History of blood transfusion   . History of mitral valve prolapse 1988   with palpitations  . History of radiation therapy 04/02/16- 05/21/16   Left Chest Wall, SCV, Axilla, IM nodes  . Hypertension   . Neuropathy ([place])    with chemo  . Osteopenia determined by x-ray   . Pneumonia    1980's and 1990's  . Raynaud's disease   . 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 Skin cancer 03/12/2014   basal- back    PAST SURGICAL HISTORY: Past Surgical History:  Procedure Laterality Date  . ABDOMINAL HYSTERECTOMY  2007   with BSO  . APPENDECTOMY  2007  . AXILLARY LYMPH NODE DISSECTION Left 02/14/2016   Procedure: LEFT AXILLARY LYMPH NODE DISSECTION;  Surgeon: [name], MD;  Location: [facility];  Service: General;  Laterality: Left;  . BREAST RECONSTRUCTION WITH PLACEMENT OF TISSUE EXPANDER AND FLEX HD (ACELLULAR HYDRATED DERMIS) Left 02/02/2016   Procedure: IMMEDIATE LEFT BREAST RECONSTRUCTION WITH PLACEMENT OF TISSUE EXPANDER AND FLEX HD (ACELLULAR HYDRATED DERMIS);  Surgeon: [name], DO;  Location: [facility];   Service: Plastics;  Laterality: Left;  . BREAST SURGERY Left 02/02/16   mastectomy with reconstruction  . c-section  1991  . COLON RESECTION SIGMOID  12/02/2012   [facility]   . HERNIA REPAIR  2007  . LAPAROSCOPIC ENDOMETRIOSIS FULGURATION  1984, 1989  . Port- a- cath placement  07/2015  . PORT-A-CATH REMOVAL Right 02/02/2016   Procedure: REMOVAL PORT-A-CATH;  Surgeon: [name], MD;  Location: [facility];  Service: General;  Laterality: Right;  . RADIOACTIVE SEED GUIDED AXILLARY SENTINEL LYMPH NODE Left 02/02/2016   Procedure: LEFT BREAST MASTECTOMY AND RADIOACTIVE SEED GUIDED LEFT SENTINEL LYMPH NODE BIOPSY ;  Surgeon: [name], MD;  Location: [facility];  Service: General;  Laterality: Left;    FAMILY HISTORY Family History  Problem Relation Age of Onset  . Lung cancer Father    the patient's father died from lung cancer at the age of 45 in the setting of tobacco abuse. The patient's mother died at the age of 47 with pancreatic cancer. The patient had no siblings. There is no history of breast or ovarian cancer in the family.  GYNECOLOGIC HISTORY:  No LMP recorded. Patient has had a hysterectomy. Menarche age 63, first live birth age 14. The patient is GX P1. She is status post total hysterectomy with bilateral salpingo-oophorectomy. She has been on hormone replacement since that time and  is tapering to off at the time of this dictation.   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 She used to work at [employer]. Her husband [name] works for [employer] division at a [employer] their son [name] is currently at home.    ADVANCED DIRECTIVES: Not in place   HEALTH MAINTENANCE: Social History  Substance Use Topics  . Smoking status: Former Smoker    Packs/day: 1.00    Years: 10.00    Types: Cigarettes    Quit date: 11/25/2008  . Smokeless tobacco: Never Used     Comment: on and off smoker never consistent  . Alcohol use 2.4  oz/week    4 Glasses of wine per week     Colonoscopy: April 2014/ Raynaldo Opitz at [facility]  PAP: s/p hysterectomy  Bone density: August 2016/ osteopenia  Lipid panel:  Allergies  Allergen Reactions  . Sulfa Antibiotics Other (See Comments)    Blisters in mouth  . Zofran [Ondansetron Hcl] Other (See Comments)    Severe Headaches     Current Outpatient Prescriptions  Medication Sig Dispense Refill  . Abemaciclib 100 MG TABS Take 100 mg by mouth daily. 21 tablet 3  . anastrozole (ARIMIDEX) 1 MG tablet Take 1 mg by mouth daily.    . cetirizine (ZYRTEC) 10 MG tablet Take 10 mg by mouth daily.    . Cholecalciferol (VITAMIN D3) 2000 UNITS capsule Take 2,000 Units by mouth daily.     . fluticasone (FLONASE) 50 MCG/ACT nasal spray Place 2 sprays into both nostrils daily. Reported on 11/18/2015    . gabapentin (NEURONTIN) 300 MG capsule Take 2 tablets in AM, 2 tablets mid-day, and 4 tablets at bedtime 240 capsule 4  . Investigational aspirin/placebo 300 MG tablet [facility] [id number] Take 1 tablet by mouth daily. Take with food or a full glass of water.  Do not crush enteric coated tablets. 180 tablet 0  . levocetirizine (XYZAL) 5 MG tablet Take 5 mg by mouth every evening.    . lidocaine (LIDODERM) 5 % Place 1 patch onto the skin daily. Remove & Discard patch within 12 hours or as directed by MD 30 patch 0  . LORazepam (ATIVAN) 0.5 MG tablet Take 1 tablet (0.5 mg total) by mouth at bedtime. 30 tablet 3  . methocarbamol (ROBAXIN) 500 MG tablet Take 1 tablet (500 mg total) by mouth 4 (four) times daily. 120 tablet 3  . Multiple Vitamin (MULTIVITAMIN) tablet Take 1 tablet by mouth daily.    Marland Kitchen olmesartan (BENICAR) 20 MG tablet TAKE ONE TABLET EACH DAY 90 tablet 0  . omega-3 acid ethyl esters (LOVAZA) 1 g capsule Take 1 g by mouth 2 (two) times daily.    Marland Kitchen oxyCODONE (OXYCONTIN) 40 mg 12 hr tablet Take 1 tablet (40 mg total) by mouth every 12 (twelve) hours. 60 tablet 0  . oxyCODONE (ROXICODONE) 15 MG  immediate release tablet Take 1 tablet (15 mg total) by mouth every 4 (four) hours as needed for severe pain. 60 tablet 0  . polyethylene glycol (MIRALAX / GLYCOLAX) packet Take 17 g by mouth daily as needed for mild constipation. Reported on 01/06/2016    . Probiotic Product (PROBIOTIC DAILY PO) Take 1 capsule by mouth daily. [place] prochlorperazine (COMPAZINE) 10 MG tablet Take 1 tablet (10 mg total) by mouth every 6 (six) hours as needed (Nausea or vomiting). 30 tablet 2  . traZODone (DESYREL) 50  MG tablet Take 3 tablets (150 mg total) by mouth at bedtime. 90 tablet 0  . zolpidem (AMBIEN) 5 MG tablet TAKE ONE TABLET AT BEDTIME AS NEEDED FORSLEEP 30 tablet 0   No current facility-administered medications for this visit.     OBJECTIVE: Middle-aged white womanWho was tearful during today's visit Vitals:   02/18/17 0846  BP: 133/66  Pulse: (!) 106  Resp: 18  Temp: 97.7 F (36.5 C)     Body mass index is 28.13 kg/m.    ECOG FS:2 - Symptomatic, <50% confined to bed  Sclerae unicteric, EOMs intact Oropharynx clear and moist No cervical or supraclavicular adenopathy Lungs no rales or rhonchi Heart regular rate and rhythm Abd soft, nontender, positive bowel sounds MSK mild lumbar spinal tenderness but pain with movement particularly twisting, no upper extremity lymphedema Neuro: nonfocal, well oriented, appropriate affect Breasts: The right breast is benign. The left breast still has the expander in place. Both axillae are benign.  LAB RESULTS:  CMP     Component Value Date/Time   NA 141 01/29/2017 1046   K 4.0 01/29/2017 1046   CL 110 02/15/2016 0330   CO2 22 01/29/2017 1046   GLUCOSE 131 01/29/2017 1046   BUN 15.8 01/29/2017 1046   CREATININE 0.9 01/29/2017 1046   CALCIUM 10.2 01/29/2017 1046   PROT 7.5 01/29/2017 1046   ALBUMIN 4.2 01/29/2017 1046   AST 19 01/29/2017 1046   ALT 12 01/29/2017 1046   ALKPHOS 56 01/29/2017 1046    BILITOT 0.33 01/29/2017 1046   GFRNONAA >60 02/15/2016 0330   GFRAA >60 02/15/2016 0330    INo results found for: SPEP, UPEP  Lab Results  Component Value Date   WBC 4.5 01/29/2017   NEUTROABS 2.9 01/29/2017   HGB 12.7 01/29/2017   HCT 37.1 01/29/2017   MCV 100.4 01/29/2017   PLT 386 01/29/2017      Chemistry      Component Value Date/Time   NA 141 01/29/2017 1046   K 4.0 01/29/2017 1046   CL 110 02/15/2016 0330   CO2 22 01/29/2017 1046   BUN 15.8 01/29/2017 1046   CREATININE 0.9 01/29/2017 1046      Component Value Date/Time   CALCIUM 10.2 01/29/2017 1046   ALKPHOS 56 01/29/2017 1046   AST 19 01/29/2017 1046   ALT 12 01/29/2017 1046   BILITOT 0.33 01/29/2017 1046       No results found for: LABCA2  No components found for: LABCA125  No results for input(s): INR in the last 168 hours.  Urinalysis    Component Value Date/Time   COLORURINE YELLOW 11/12/2015 [place] 11/12/2015 0837   LABSPEC 1.005 05/15/2016 1405   PHURINE 6.0 05/15/2016 1405   PHURINE 6.5 11/12/2015 0837   GLUCOSEU Negative 05/15/2016 1405   HGBUR Small 05/15/2016 1405   HGBUR MODERATE (A) 11/12/2015 0837   BILIRUBINUR Negative 05/15/2016 1405   KETONESUR Negative 05/15/2016 [place] 11/12/2015 0837   PROTEINUR Negative 05/15/2016 1405   PROTEINUR NEGATIVE 11/12/2015 0837   UROBILINOGEN 0.2 05/15/2016 1405   NITRITE Negative 05/15/2016 1405   NITRITE NEGATIVE 11/12/2015 0837   LEUKOCYTESUR Moderate 05/15/2016 1405    STUDIES: Bone density January 2018 shows T score of -1.7  PROTOCOLS: BWEL, ABC and PALLAS, [place] study  ASSESSMENT: 56 y.o. [place] woman status post biopsy of 2 separate masses in the left breast upper outer quadrant 08/04/2015, clinically mT2 N1, stage IIb/IIIa invasive ductal carcinoma, grade  3, the larger mass being estrogen and progesterone receptor positive, HER-2 negative, with an MIB-1 of 12%  (a) biopsy of a left axillary  lymph node 08/09/2015 was positive, with extracapsular extension  (1) neoadjuvant chemotherapy started 08/25/2015 consisting of doxorubicin and cyclophosphamide in dose dense fashion 4, completed 10/06/2015,  followed by weekly paclitaxel 10 completed 12/23/2015  (a) final 2 planned cycles of weekly paclitaxel omitted because of neuropathy concerns  (2) status post left mastectomy with targeted axillary dissection 02/02/2016 for a ypT2 ypN2a, stage IIIa invasive ductal carcinoma, grade 2, with negative margins, repeat prognostic panel showing estrogen receptor positive, but progesterone receptor and HER-2 negative  (a) completion axillary lymph node dissection 02/14/2016 showed an additional 2 out of 8 lymph nodes to be involved (final count 6 out of 10 lymph nodes positive  (b) expander placement at the time of left mastectomy  (3) adjuvant capecitabine starting concurrently with radiation--discontinued 05/21/2016  (4) adjuvant  radiation started 04/02/2016, completed 05/21/2016  (5) anastrozole started October 2017  (a) bone density August 2016 showed osteopenia  (b) repeat bone density 09/07/2016 shows a T score of -1.7.  (c) the patient is status post total abdominal hysterectomy with bilateral salpingo-oophorectomy.  (6) postoperative pain: Starting OxyContin 20 mg twice a day with oxycodone as needed for breakthrough pain 03/02/2016  (a) failed transition to tramadol, gabapentin, naproxen   (b) started methadone 5 mg TID as of 12/20/2016-- not tolerated  (c) robaxin added June 2018 to NSAIDS and tylenol  (7) Research:  (a) B-WEL study ([facility] [id number])--enrolled in Arm 1 (received education)  (b) PALLAS AFT-05 study: randomized to treatment, started palbociclib 07/26/2016   (i) dose reduced to 100 mg, then to 75 mg because of cytopenias   (ii) went off study study May 2018 because of persistent cytopenias  (c) taxane study 12/20/2016  (d) aspirin study  METASTATIC DISEASE June  2018: CT scan of the lumbar spine shows multiple lesions (8) the plan at this point is to continue anastrozole, at fulvestrant, denosumab/Xgeva, and abemaciclib    PLAN: The CT scan of the lumbar spine is strongly suggestive of metastatic disease. It shows both lytic and blastic lesions. Recall she had a bone scan and CT of the chest with contrast August 2017, both of which were negative. It could be that she had only lytic lesions at the time. However these were not noted on the CT scan of the chest, which shows a significant portion of the spine.  I think at this point a PET scan may be the best way to restage [place]. We will not be able to obtain a total spine MRI because she has an implant in place that is incompatible. We will do CT of the cervical and thoracic spine.  She understands that stage IV breast cancer is not curable with our current knowledge base. The goal of treatment is control. The strategy of treatment is to do only the minimum necessary to control the growth of the tumor so that the patient can have as normal a life as possible. There is no survival advantage in treating aggressively if treating less aggressively results in tumor control. With this strategy stage IV breast cancer in many cases can function as a "chronic illness": something that cannot be quite gotten rid of but can be controlled for an indefinite period of time  There are always 3 questions to go over and so we reviewed those today. The first question is do we treat or not. In  some cases the patient's overall situation is so discouraging that palliative/comfort care alone is appropriate. If the decision is made to treat, then the next question is whether anti-estrogens or chemotherapy is more appropriate. Once that decision is made than the third question is: Which agent or combination of agents in particular should be used?  [place] very much once treatment and very much would prefer to have anti-estrogens. She  understands that approximately 10-15% of the time the metastases lose their estrogen dependence and in that case chemotherapy would be the only choice. We're going to have to do a biopsy to confirm that the tumor is still estrogen receptor positive. However we are going to wait until she has her PET scan and CT scans so we can identify the optimal place to biopsy.  I'm going to switch her from his alendronate to [facility] and we are going to continue the anastrozole but at fulvestrant. We discussed the possible toxicities, side effects and complications of these agents and she will have the first dose this Friday.  She will see me again in 02/28/2017. Recall that she did take palbociclib between 07/26/2016 and 01/09/2017, stopped because of low counts. We're going to give abemaciclib  A try. Hopefully we will have this available when she returns to se me 02/28/2017   :Chauncey Cruel, MD   02/18/2017 9:48 AM

## 2017-02-19 ENCOUNTER — Other Ambulatory Visit: Payer: Self-pay | Admitting: *Deleted

## 2017-02-19 ENCOUNTER — Other Ambulatory Visit: Payer: 59

## 2017-02-19 ENCOUNTER — Ambulatory Visit: Payer: 59

## 2017-02-20 ENCOUNTER — Telehealth: Payer: Self-pay | Admitting: *Deleted

## 2017-02-20 NOTE — Telephone Encounter (Signed)
02/20/2017 Spoke with patient by phone with follow-up regarding research study participation. Made arrangements to meet with patient on Friday, following her injection appointment, for further discussion. The following items were discussed with the patient: AFT-05 PALLAS study - study treatment with endocrine therapy will end, however, patient is currently remaining on anastrozole as part of her metastatic regimen. Patient continuing to complete her anti-hormone diaries, and will return these at her appointment on Friday. Will discuss with study staff further to identify date of last dose of study-related treatment. Patient is aware that an end-of-treatment visit with research sample collection is expected to occur 30-42 days following the last dose. [facility] [id number] ABC study - Study treatment is to end now; patient to record final dose and return study medication and diaries at the time of her appointment on Friday. Will review end of treatment and follow up at that time. [facility] [phone] BWEL study - Patient and physician both agree to continued study participation. Patient is especially interested in receiving the cookbook and would like to stay in the study and continue with health education. Appointment scheduled with research nurse for Friday, following injection appointment. Cindy S. Brigitte Pulse BSN, RN, CCRP 02/20/2017 1:36 PM

## 2017-02-21 ENCOUNTER — Ambulatory Visit: Payer: 59 | Admitting: Physical Therapy

## 2017-02-21 DIAGNOSIS — R293 Abnormal posture: Secondary | ICD-10-CM | POA: Diagnosis not present

## 2017-02-21 DIAGNOSIS — M5440 Lumbago with sciatica, unspecified side: Secondary | ICD-10-CM

## 2017-02-21 DIAGNOSIS — G8929 Other chronic pain: Secondary | ICD-10-CM

## 2017-02-21 DIAGNOSIS — M25512 Pain in left shoulder: Secondary | ICD-10-CM

## 2017-02-21 DIAGNOSIS — M25612 Stiffness of left shoulder, not elsewhere classified: Secondary | ICD-10-CM

## 2017-02-21 DIAGNOSIS — I890 Lymphedema, not elsewhere classified: Secondary | ICD-10-CM

## 2017-02-21 NOTE — Telephone Encounter (Signed)
Oral Oncology Patient Advocate Encounter  Prior Authorization for Alison [name] has been approved.    PA# [id number] Effective dates: 02/19/2017 through 08/22/2017  Oral Oncology Clinic will continue to follow.   Fabio Asa. Alison [name], [place] Oral Oncology Patient Advocate [phone] 02/21/2017 9:26 AM

## 2017-02-21 NOTE — Therapy (Signed)
[place], [place], [address] Phone: [phone]   Fax:  [phone]  Physical Therapy Treatment  Patient Details  Name: Alison [name] MRN: [id number] Date of Birth: [birth year] Referring Provider: Dr. Barry Dienes   Encounter Date: 02/21/2017      PT End of Session - 02/21/17 1650    Visit Number 27   Number of Visits 39   Date for PT Re-Evaluation 04/26/17   PT Start Time 1300   PT Stop Time 1345   PT Time Calculation (min) 45 min   Activity Tolerance Patient limited by pain   Behavior During Therapy [facility] for tasks assessed/performed      Past Medical History:  Diagnosis Date  . Asthma     seasonal asthma  . Breast cancer ([place])   . Complication of anesthesia 2007   aspiration pneumonia post hysterectomy.  Blood pressure would drop low- but no problems 01/2016- surgery  . History of blood transfusion   . History of mitral valve prolapse 1988   with palpitations  . History of radiation therapy 04/02/16- 05/21/16   Left Chest Wall, SCV, Axilla, IM nodes  . Hypertension   . Neuropathy ([place])    with chemo  . Osteopenia determined by x-ray   . Pneumonia    1980's and 1990's  . Raynaud's disease   . 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 Skin cancer 03/12/2014   basal- back    Past Surgical History:  Procedure Laterality Date  . ABDOMINAL HYSTERECTOMY  2007   with BSO  . APPENDECTOMY  2007  . AXILLARY LYMPH NODE DISSECTION Left 02/14/2016   Procedure: LEFT AXILLARY LYMPH NODE DISSECTION;  Surgeon: [name], MD;  Location: [facility];  Service: General;  Laterality: Left;  . BREAST RECONSTRUCTION WITH PLACEMENT OF TISSUE EXPANDER AND FLEX HD (ACELLULAR HYDRATED DERMIS) Left 02/02/2016   Procedure: IMMEDIATE LEFT BREAST RECONSTRUCTION WITH PLACEMENT OF TISSUE EXPANDER AND FLEX HD (ACELLULAR HYDRATED DERMIS);  Surgeon: [name], DO;  Location: [facility];  Service: Plastics;  Laterality: Left;  . BREAST SURGERY Left 02/02/16   mastectomy with reconstruction  . c-section  1991  . COLON RESECTION SIGMOID  12/02/2012   [facility]   . HERNIA REPAIR  2007  . LAPAROSCOPIC ENDOMETRIOSIS FULGURATION  1984, 1989  . Port- a- cath placement  07/2015  . PORT-A-CATH REMOVAL Right 02/02/2016   Procedure: REMOVAL PORT-A-CATH;  Surgeon: [name], MD;  Location: [facility];  Service: General;  Laterality: Right;  . RADIOACTIVE SEED GUIDED AXILLARY SENTINEL LYMPH NODE Left 02/02/2016   Procedure: LEFT BREAST MASTECTOMY AND RADIOACTIVE SEED GUIDED LEFT SENTINEL LYMPH NODE BIOPSY ;  Surgeon: [name], MD;  Location: [facility];  Service: General;  Laterality: Left;    There were no vitals filed for this visit.      Subjective Assessment - 02/21/17 1309    Subjective "My cancer came back.  Its in the bone" Pt reports she has lytic lesions in the left back. She will have a PET scan tomorrow and meet with Dr. [name] to determine plan of care    Pertinent History Breast cancer diagnosed November2016 She had chemotherapy. but did have some neuropathy so it had to be stopped. On June 8, she had a mastectomy with immedicate expander placement and with first set of lymphnodes removed, On June 20 she went for complete axillary lymph node dissection. She  says she has had always been "full necked" but has neck and upper body fullness since the cancer was diagnosed. She reports she has osteopenia in low back and has had a fracture that was discovered in 2014 when she had a Dexa scan.   Patient Stated Goals to decrease the pain    Currently in Pain? Yes   Pain Score 7    Pain Location Back   Pain Orientation Left;Lower   Pain Descriptors / Indicators Sharp   Pain Type Acute pain                         [facility] Adult PT Treatment/Exercise - 02/21/17 0001      Self-Care   Self-Care Other Self-Care Comments    Other Self-Care Comments  instructin in log rolling to help decrease back pain      Lumbar Exercises: Supine   Other Supine Lumbar Exercises gentle pelvis tilts for abdominal activation, glute and quat sets and single knee to table top position      Manual Therapy   Manual Lymphatic Drainage (MLD) in supine short neck, superficial and deep abdominals, right axillary nodes, anterior interaxillary anastamosis, anterior and posterior neck and Lt inguinal nodes and Lt axillo-inguinal anastomosis briefly, then to right sidelying for posterior interaxillary anastamosis.                 PT Education - 02/21/17 1649    Education provided Yes   Education Details log rolling for bed mobility and transfers  with less pain                 Long Term Clinic Goals - 02/21/17 1658      CC Long Term Goal  #1   Title Patient will report a decrease in left shoulder pain by 75% so she can perform daily activities with greater ease   Baseline Pain had gotten better, but has increased pain and swelling    Time 8   Status On-going     CC Long Term Goal  #2   Title Pt will report she knows strategies for good body mechanics to decrease low back pain    Time 8   Period Weeks   Status New     CC Long Term Goal  #3   Title Pt will be independent in core and LE strengthening exercises    Time 8   Period Weeks   Status New     CC Long Term Goal  #4   Title Pt will report low back pain is decreased to a 4/10 so that she can perform activities of daily living easier    Time [place] - 02/21/17 1651    Clinical Impression Statement Alison [name] now has lytic in sclerotic lesions in her spine that are causing her pain and limiting her mobiliy.  She still has fullness in her neck and upper back and abdomen appears larger and is more firm today.  She will undergo a PET scan and form a new medical plan of care but would like to be followed by PT for pain  management, and core and LE strengthening to protect her back. She would also like to continue to work on her lyphedema in her left axilla as well.  She is not sure if she will be having more surgery in August and  will be on vacation til the end of July so renewal was sent  for treatment til -august 31    Rehab Potential Fair   Clinical Impairments Affecting Rehab Potential chemotherapy with neuropathy    PT Frequency 2x / week   PT Duration 4 weeks   PT Treatment/Interventions ADLs/Self Care Home Management;Manual lymph drainage;Compression bandaging;Scar mobilization;Passive range of motion;Patient/family education;Taping;Manual techniques;Therapeutic exercise;Therapeutic activities;Orthotic Fit/Training;DME Instruction;Electrical Stimulation;Moist Heat  TENS unit for pain managment    PT Next Visit Plan instruct in body mechanics and beginning core activation exercises within pain limits.  Use TENS as adjunct for pain management and if it is helpful, arrange for pt to have one at home manual lymph drainage and manual techniques to manage pain update HEP  with progression as able     Consulted and Agree with Plan of Care Patient      Patient will benefit from skilled therapeutic intervention in order to improve the following deficits and impairments:  Decreased range of motion, Impaired UE functional use, Decreased activity tolerance, Decreased knowledge of precautions, Decreased skin integrity, Impaired perceived functional ability, Pain, Decreased knowledge of use of DME, Increased edema, Postural dysfunction, Decreased strength, Impaired sensation, Decreased scar mobility, Decreased mobility, Increased muscle spasms  Visit Diagnosis: Abnormal posture - Plan: PT plan of care cert/re-cert  Lymphedema, not elsewhere classified - Plan: PT plan of care cert/re-cert  Stiffness of left shoulder joint - Plan: PT plan of care cert/re-cert  Chronic left shoulder pain - Plan: PT plan of care  cert/re-cert  Low back pain with sciatica, sciatica laterality unspecified, unspecified back pain laterality, unspecified chronicity - Plan: PT plan of care cert/re-cert     Problem List Patient Active Problem List   Diagnosis Date Noted  . Osteopenia determined by x-ray 09/20/2016  . Primary malignant neoplasm of left breast with stage 2 nodal metastasis per American Joint Committee on Cancer 7th edition (N2) ([place]) 02/14/2016  . 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 Insomnia 09/01/2015  . Malignant neoplasm of upper-outer quadrant of left breast in female, estrogen receptor positive ([place]) 08/08/2015   Donato Heinz. Owens Shark PT  Alison [name] 02/21/2017, 5:05 PM  [place], [place], [address] Phone: [phone]   Fax:  [phone]  Name: Alison [name] MRN: [id number] Date of Birth: May 02, 1961

## 2017-02-22 ENCOUNTER — Other Ambulatory Visit: Payer: Self-pay | Admitting: *Deleted

## 2017-02-22 ENCOUNTER — Ambulatory Visit (HOSPITAL_BASED_OUTPATIENT_CLINIC_OR_DEPARTMENT_OTHER): Payer: 59

## 2017-02-22 ENCOUNTER — Ambulatory Visit (HOSPITAL_COMMUNITY)
Admission: RE | Admit: 2017-02-22 | Discharge: 2017-02-22 | Disposition: A | Discharge status: Home / Self Care | Payer: 59 | Source: Ambulatory Visit | Attending: Oncology | Admitting: Oncology

## 2017-02-22 ENCOUNTER — Encounter: Payer: 59 | Admitting: *Deleted

## 2017-02-22 VITALS — BP 137/73 | HR 99 | Temp 97.8°F | Resp 18

## 2017-02-22 DIAGNOSIS — C7951 Secondary malignant neoplasm of bone: Secondary | ICD-10-CM | POA: Diagnosis not present

## 2017-02-22 DIAGNOSIS — C751 Malignant neoplasm of pituitary gland: Secondary | ICD-10-CM | POA: Diagnosis not present

## 2017-02-22 DIAGNOSIS — C50412 Malignant neoplasm of upper-outer quadrant of left female breast: Secondary | ICD-10-CM

## 2017-02-22 DIAGNOSIS — C779 Secondary and unspecified malignant neoplasm of lymph node, unspecified: Secondary | ICD-10-CM

## 2017-02-22 DIAGNOSIS — Z170 Estrogen receptor positive status [ER+]: Secondary | ICD-10-CM | POA: Insufficient documentation

## 2017-02-22 DIAGNOSIS — M8580 Other specified disorders of bone density and structure, unspecified site: Secondary | ICD-10-CM

## 2017-02-22 DIAGNOSIS — C50912 Malignant neoplasm of unspecified site of left female breast: Secondary | ICD-10-CM | POA: Diagnosis not present

## 2017-02-22 DIAGNOSIS — Z5111 Encounter for antineoplastic chemotherapy: Secondary | ICD-10-CM | POA: Diagnosis not present

## 2017-02-22 LAB — GLUCOSE, CAPILLARY: Glucose-Capillary: 102 mg/dL — ABNORMAL HIGH (ref 65–99)

## 2017-02-22 MED ORDER — ZOLPIDEM TARTRATE 5 MG PO TABS: ORAL_TABLET | ORAL | 0 refills | Status: DC

## 2017-02-22 MED ORDER — OXYCODONE HCL 15 MG PO TABS: ORAL_TABLET | ORAL | 0 refills | Status: DC

## 2017-02-22 MED ORDER — FLUDEOXYGLUCOSE F - 18 (FDG) INJECTION
7.6200 | Freq: Once | INTRAVENOUS | Status: AC | PRN
  Administered 2017-02-22: 7.62 via INTRAVENOUS

## 2017-02-22 MED ORDER — FULVESTRANT 250 MG/5ML IM SOLN
500.0000 mg | Freq: Once | INTRAMUSCULAR | Status: AC
  Administered 2017-02-22: 500 mg via INTRAMUSCULAR
  Filled 2017-02-22: qty 10

## 2017-02-22 MED FILL — VERZENIO 100 MG TAB: 100 | 28 days supply | Qty: 56 | Fill #0

## 2017-02-22 NOTE — Patient Instructions (Signed)

## 2017-02-22 NOTE — Progress Notes (Signed)
02/22/2017 Patient in to clinic today accompanied by her husband, following radiology and injection appointments. Met with patient to discuss research study transitions, as described below.  AFT-05 [facility] study - Patient returned her completed Cycle 7 anastrozole diary and partially completed Cycle 8 anastrozole diary. As patient is continuing anastrozole as combination therapy for metastatic disease, inquiry sent to study monitor for determination of final study dose. Diaries show no missed dose in Cycle 7, and one missed dose on 02/15/17, Day 2 of Cycle 8. Patient continued recording doses through yesterday, but has not taken a dose today. Patient is aware that she remains in the clinical trial, moving to a Follow-up phase, and she is in agreement with this plan. Patient is also aware that an end-of-treatment visit with research sample collection is expected to occur 30-42 days following the last dose, once that date has been determined. The visit is expected to occur at the time of her currently scheduled appointment on 03/19/17, if that is within the window allowed.  [facility] [id number] BWEL study - As previously noted, patient and physician both agree to continued study participation. Will follow-up with study chair for approval and study staff for method of continuing health education following disease recurrence, with receipt of cookbook of particular interest to the patient. Patient is aware that we are also inquiring about the status of future measurements, questionnaires and sample collection, and will notify her if those will be collected in the future. At a minimum, patient is aware that she does remain in the study, with annual reporting of her status.  [facility] [id number] ABC - Month 6 visit Patient returned completed medication logs for June 2018 with dosing through treatment discontinuation on 02/20/2017, due to disease recurrence. Study medication was returned for drug accountability by pharmacist,  Henreitta Leber. Patient returned the two previously dispensed bottles of study medication from the Month 6 visit earlier this month, with one unopened 90-tablet bottle and one partial bottle with 68 tablets remaining. Patient denies any symptoms of nosebleeds, blood in her urine or stool, and reports no symptoms of gastritis. She has had occasional mild episodes of heartburn since her last protocol visit here on 01/29/17. There has been no documentation of intracranial hemorrhage and she denies any episodes of unusual bruising.  Patient reports taking Advil regularly, with doses of about 4 per week (minimum of one time per week, on average). Patient is aware that she is entering the follow-up phase for this study, and she agrees to continued follow-up. Explained to patient that study staff are clarifying the assessments to be continued in follow-up, and she will be notified of those as needed. Thanked patient for her study participation.  Cindy S. Brigitte Pulse BSN, RN, [place] 02/22/2017 4:31 PM  Adverse Event Log Protocol: [facility] [id number] ABC study End of treatment report: 01/29/2017 - 02/20/2017 Event Grade Attribution to aspirin/placebo  Heartburn/dyspepsia 1 Unlikely  Non-reportable grade 1 AEs, other than solicited: - Weight loss Cindy S. Brigitte Pulse BSN, RN, [place] 03/21/2017 3:09 PM

## 2017-02-25 ENCOUNTER — Other Ambulatory Visit: Payer: Self-pay | Admitting: *Deleted

## 2017-02-25 ENCOUNTER — Other Ambulatory Visit: Payer: Self-pay | Admitting: Oncology

## 2017-02-25 ENCOUNTER — Telehealth: Payer: Self-pay | Admitting: Pharmacy Technician

## 2017-02-25 NOTE — Telephone Encounter (Signed)
Oral Oncology Patient Advocate Encounter  Met patient in waiting room to complete application for [place] for [employer] in an effort to reduce patient's out of pocket expense to $0.    Patient's insurance is structured such that 100% of the cost of any brand name drug is to be paid by the patient.  In this case, since Verzenio 148m is not available as a generic, the out-of-pocket cost for Mrs. Gunner would be $12,257.47 monthly.   I was able to supply her with a copay card that will cover the first 2 months of therapy at no cost to her while we wait [facility]  We are aware that the patient's household income exceeds the program's limit, but given the high out-of-pocket expense, it is worthy to try.    Application completed and faxed to [phone]   [employer] patient assistance phone number for follow up is [phone]   This encounter will be updated until final determination.   EFabio Asa HMelynda Keller AEast BrooklynPatient ABon Secour [phone]/09/2016 2:12 PM

## 2017-02-26 ENCOUNTER — Ambulatory Visit (HOSPITAL_COMMUNITY): Payer: 59

## 2017-02-26 ENCOUNTER — Ambulatory Visit (HOSPITAL_COMMUNITY)
Admission: RE | Admit: 2017-02-26 | Discharge: 2017-02-26 | Disposition: A | Discharge status: Home / Self Care | Payer: 59 | Source: Ambulatory Visit | Attending: Oncology | Admitting: Oncology

## 2017-02-26 DIAGNOSIS — C50912 Malignant neoplasm of unspecified site of left female breast: Secondary | ICD-10-CM

## 2017-02-26 DIAGNOSIS — C50412 Malignant neoplasm of upper-outer quadrant of left female breast: Secondary | ICD-10-CM | POA: Diagnosis not present

## 2017-02-26 DIAGNOSIS — Z170 Estrogen receptor positive status [ER+]: Secondary | ICD-10-CM | POA: Insufficient documentation

## 2017-02-26 DIAGNOSIS — C7951 Secondary malignant neoplasm of bone: Secondary | ICD-10-CM | POA: Diagnosis not present

## 2017-02-26 DIAGNOSIS — C779 Secondary and unspecified malignant neoplasm of lymph node, unspecified: Secondary | ICD-10-CM | POA: Diagnosis not present

## 2017-02-26 MED ORDER — IOPAMIDOL (ISOVUE-300) INJECTION 61%
INTRAVENOUS | Status: AC
  Filled 2017-02-26: qty 100

## 2017-02-26 MED ORDER — IOPAMIDOL (ISOVUE-300) INJECTION 61%
100.0000 mL | Freq: Once | INTRAVENOUS | Status: AC | PRN
  Administered 2017-02-26: 100 mL via INTRAVENOUS

## 2017-02-27 NOTE — Progress Notes (Signed)
[place]  [phone] [phone]) [phone]   ID: Alison [name] DOB: [birth year]  MR#: [id number]  XBW#:[id number]  Patient Care Team: Alison Pao, MD as PCP - General (Internal Medicine) Alison Luna, MD as Consulting Physician (General Surgery) Alison [name], Alison Dad, MD as Consulting Physician (Oncology) Alison Gell, MD as Consulting Physician (Obstetrics and Gynecology) Alison Sine, MD as Consulting Physician (Dermatology) Alison Norway, RN as Registered Nurse (Oncology) Alison Gibson, MD as Attending Physician (Radiation Oncology) PCP: Alison Pao, MD OTHER MD:  CHIEF COMPLAINT: Estrogen receptor positive breast cancer  CURRENT TREATMENT: Anastrozole, fulvestrant, abemaciclib, denosumab/Xgeva  INTERVAL HISTORY: Alison [name] returns today for follow-up and treatment of her estrogen receptor positive breast cancer accompanied by her husband. Since her last visit here she has had scans which show extensive bony disease, but no definitive involvement of the liver or lungs. They also do not show evidence of cord compression or encroachment. I called her with these results  At the last visit we also started her on OxyContin 40 mg twice daily and oxycodone 15-30 mg every 4 hours as needed. The OxyContin was increased, not clearly whether by [place] or by the patient herself, to 3 times a day. This has brought the pain under fair control and she has not required much by way of breakthrough pain medicines. She only takes the oxycodone fast relief at night.  However she has become very sleepy, confused, and really not able to function. She has also developed some jerking movements again due to the narcotics. We reviewed all this in great detail address this problem today  So far she is tolerating the abemaciclib well. The issue there is cost, as they will not qualify for support from the company.  REVIEW OF SYSTEMS:  Alison [name] has only been minimally  constipated. She feels dizzy and jumpy. She is very fatigued. She describes the Faslodex injections is very painful. A detailed review of systems today was otherwise stable   BREAST CANCER HISTORY: From the original intake note:  Alison [name] had routine screening mammography with tomography at [facility] February 20 11/14/2014 showing a possible mass in the right breast. Diagnostic right mammography with right breast ultrasonography 11/01/2014 found the breast density to be category C. In the upper outer quadrant of the right breast there was a partially obscured mass which on physical exam was palpable as an area of thickening at the 10:00 position 8 cm from the nipple. Ultrasound showed multiple cysts in the upper outer right breast corresponding to the mass in question.  In November 2016 the patient felt a change in the upper left breast and brought it to her primary care physician's attention. Diagnostic left mammography with tomosynthesis and left breast ultrasonography at the [facility] December 01/14/2015 found trabecular thickening throughout the left breast with skin thickening, but no circumscribed mass or suspicious calcifications. A rounded mass in the left axilla measured 6 mm. There was an additional mildly prominent lymph node in the left axilla which was what the patient had been palpating. Ultrasonography showed ill-defined swelling throughout the inner left breast with overlying skin thickening.. At the 2:00 position 4 cm from the nipple there was an irregular hypoechoic mass measuring 2.3 cm. A second mass at the 1:00 area 3 cm from the nipple measured 1 cm. The distance between these 2 masses was 1.8 cm. There was also a round hypoechoic left axillary mass measuring 0.6 cm. Overall the was an area of 3.7 cm of mixed  echogenicity corresponding to the area of palpable soft tissue swelling.   On 08/04/2015 the patient underwent biopsy of the mass at the 2:00 axis 4 cm from the nipple  and a second biopsy was performed of the hypoechoic mass at the 1:00 position 3 cm from the nipple. Biopsy of the suspicious nodule in the lower left axilla was attempted but could not be completed as the mass became obscured after administration of lidocaine.  The pathology from both left breast biopsies 08/04/2015 ([id number]) showed invasive ductal carcinoma, grade 3. The prognostic profile obtained from the larger tumor showed estrogen receptor to be strongly positive at 95%, progesterone receptor positive at 30%, with an MIB-1 of 12%, and no HER-2 amplification, the signals ratio being 1.22 and the number per cell 2.20.  On 08/09/2015 the patient underwent biopsy of this suspicious left axillary lymph node noted above, and this showed ([id number]) metastatic carcinoma with extracapsular extension.  The patient's subsequent history is as detailed below    PAST MEDICAL HISTORY: Past Medical History:  Diagnosis Date  . Asthma     seasonal asthma  . Breast cancer ([place])   . Complication of anesthesia 2007   aspiration pneumonia post hysterectomy.  Blood pressure would drop low- but no problems 01/2016- surgery  . History of blood transfusion   . History of mitral valve prolapse 1988   with palpitations  . History of radiation therapy 04/02/16- 05/21/16   Left Chest Wall, SCV, Axilla, IM nodes  . Hypertension   . Neuropathy ([place])    with chemo  . Osteopenia determined by x-ray   . Pneumonia    1980's and 1990's  . Raynaud's disease   . 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 Skin cancer 03/12/2014   basal- back    PAST SURGICAL HISTORY: Past Surgical History:  Procedure Laterality Date  . ABDOMINAL HYSTERECTOMY  2007   with BSO  . APPENDECTOMY  2007  . AXILLARY LYMPH NODE DISSECTION Left 02/14/2016   Procedure: LEFT AXILLARY LYMPH NODE DISSECTION;  Surgeon: [name], MD;  Location: [facility];  Service: General;  Laterality: Left;  . BREAST  RECONSTRUCTION WITH PLACEMENT OF TISSUE EXPANDER AND FLEX HD (ACELLULAR HYDRATED DERMIS) Left 02/02/2016   Procedure: IMMEDIATE LEFT BREAST RECONSTRUCTION WITH PLACEMENT OF TISSUE EXPANDER AND FLEX HD (ACELLULAR HYDRATED DERMIS);  Surgeon: [name], DO;  Location: [facility];  Service: Plastics;  Laterality: Left;  . BREAST SURGERY Left 02/02/16   mastectomy with reconstruction  . c-section  1991  . COLON RESECTION SIGMOID  12/02/2012   [facility]   . HERNIA REPAIR  2007  . LAPAROSCOPIC ENDOMETRIOSIS FULGURATION  1984, 1989  . Port- a- cath placement  07/2015  . PORT-A-CATH REMOVAL Right 02/02/2016   Procedure: REMOVAL PORT-A-CATH;  Surgeon: [name], MD;  Location: [facility];  Service: General;  Laterality: Right;  . RADIOACTIVE SEED GUIDED AXILLARY SENTINEL LYMPH NODE Left 02/02/2016   Procedure: LEFT BREAST MASTECTOMY AND RADIOACTIVE SEED GUIDED LEFT SENTINEL LYMPH NODE BIOPSY ;  Surgeon: [name], MD;  Location: [facility];  Service: General;  Laterality: Left;    FAMILY HISTORY Family History  Problem Relation Age of Onset  . Lung cancer Father    the patient's father died from lung cancer at the age of 68 in the setting of tobacco abuse. The patient's mother died at the age of 53 with pancreatic cancer. The patient had no  siblings. There is no history of breast or ovarian cancer in the family.  GYNECOLOGIC HISTORY:  No LMP recorded. Patient has had a hysterectomy. Menarche age 7, first live birth age 37. The patient is GX P1. She is status post total hysterectomy with bilateral salpingo-oophorectomy. She has been on hormone replacement since that time and is tapering to off at the time of this dictation.   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 She used to work at [employer]. Her husband [name] works for [employer] division at a [employer] their son [name] is currently at home.    ADVANCED  DIRECTIVES: Not in place   HEALTH MAINTENANCE: Social History  Substance Use Topics  . Smoking status: Former Smoker    Packs/day: 1.00    Years: 10.00    Types: Cigarettes    Quit date: 11/25/2008  . Smokeless tobacco: Never Used     Comment: on and off smoker never consistent  . Alcohol use 2.4 oz/week    4 Glasses of wine per week     Colonoscopy: April 2014/ Raynaldo Opitz at [facility]  PAP: s/p hysterectomy  Bone density: August 2016/ osteopenia  Lipid panel:  Allergies  Allergen Reactions  . Sulfa Antibiotics Other (See Comments)    Blisters in mouth  . Zofran [Ondansetron Hcl] Other (See Comments)    Severe Headaches     Current Outpatient Prescriptions  Medication Sig Dispense Refill  . Abemaciclib 100 MG TABS Take 100 mg by mouth 2 (two) times daily. 60 tablet 3  . anastrozole (ARIMIDEX) 1 MG tablet Take 1 mg by mouth daily.    . cetirizine (ZYRTEC) 10 MG tablet Take 10 mg by mouth daily.    . Cholecalciferol (VITAMIN D3) 2000 UNITS capsule Take 2,000 Units by mouth daily.     . fluticasone (FLONASE) 50 MCG/ACT nasal spray Place 2 sprays into both nostrils daily. Reported on 11/18/2015    . gabapentin (NEURONTIN) 300 MG capsule Take 1 capsule (300 mg total) by mouth at bedtime as needed. Take 2 tablets in AM, 2 tablets mid-day, and 4 tablets at bedtime 90 capsule 4  . lidocaine (LIDODERM) 5 % APPLY ONE PATCH DAILY -REMOVE AND DISCARD WITHIN 12 HOURS OR AS DIRECTED BY MD 30 patch 0  . LORazepam (ATIVAN) 0.5 MG tablet Take 1 tablet (0.5 mg total) by mouth at bedtime. 30 tablet 3  . Multiple Vitamin (MULTIVITAMIN) tablet Take 1 tablet by mouth daily.    Marland Kitchen omega-3 acid ethyl esters (LOVAZA) 1 g capsule Take 1 g by mouth 2 (two) times daily.    Marland Kitchen oxyCODONE (OXYCONTIN) 40 mg 12 hr tablet Take 1 tablet (40 mg total) by mouth every 12 (twelve) hours. 60 tablet 0  . oxyCODONE (ROXICODONE) 15 MG immediate release tablet Take 1 tablet (15 mg total) by mouth every 8 (eight) hours as  needed for pain. 60 tablet 0  . polyethylene glycol (MIRALAX / GLYCOLAX) packet Take 17 g by mouth daily as needed for mild constipation. Reported on 01/06/2016    . Probiotic Product (PROBIOTIC DAILY PO) Take 1 capsule by mouth daily. [place] prochlorperazine (COMPAZINE) 10 MG tablet Take 1 tablet (10 mg total) by mouth every 6 (six) hours as needed (Nausea or vomiting). 30 tablet 2  . traZODone (DESYREL) 50 MG tablet Take 3 tablets (150 mg total) by mouth at bedtime. 90 tablet 0  . zolpidem (AMBIEN) 5  MG tablet TAKE ONE TABLET AT BEDTIME AS NEEDED FORSLEEP 30 tablet 0   No current facility-administered medications for this visit.     OBJECTIVE: Middle-aged white woman Who appears stated age  72:   02/28/17 1507  BP: (!) 88/53  Pulse: 99  Resp: 20  Temp: (!) 97.5 F (36.4 C)     Body mass index is 27.95 kg/m.    ECOG FS:2 - Symptomatic, <50% confined to bed  Sclerae unicteric, pupils round and equal Oropharynx clear and moist No cervical or supraclavicular adenopathy Lungs no rales or rhonchi Heart regular rate and rhythm Abd soft, nontender, positive bowel sounds MSK diffuse spinal tenderness to mild palpation, no upper extremity lymphedema Neuro: nonfocal, slightly lethargic, well oriented, appropriate affect Breasts: Deferred   LAB RESULTS:  CMP     Component Value Date/Time   NA 135 (L) 02/28/2017 1417   K 5.6 No visable hemolysis (H) 02/28/2017 1417   CL 110 02/15/2016 0330   CO2 26 02/28/2017 1417   GLUCOSE 119 02/28/2017 1417   BUN 36.2 (H) 02/28/2017 1417   CREATININE 4.4 (HH) 02/28/2017 1417   CALCIUM 10.4 02/28/2017 1417   PROT 7.3 02/28/2017 1417   ALBUMIN 3.7 02/28/2017 1417   AST 38 (H) 02/28/2017 1417   ALT 39 02/28/2017 1417   ALKPHOS 85 02/28/2017 1417   BILITOT 0.52 02/28/2017 1417   GFRNONAA >60 02/15/2016 0330   GFRAA >60 02/15/2016 0330    INo results found for: SPEP, UPEP  Lab Results    Component Value Date   WBC 5.7 02/28/2017   NEUTROABS 4.7 02/28/2017   HGB 10.1 (L) 02/28/2017   HCT 31.2 (L) 02/28/2017   MCV 101.3 (H) 02/28/2017   PLT 279 02/28/2017      Chemistry      Component Value Date/Time   NA 135 (L) 02/28/2017 1417   K 5.6 No visable hemolysis (H) 02/28/2017 1417   CL 110 02/15/2016 0330   CO2 26 02/28/2017 1417   BUN 36.2 (H) 02/28/2017 1417   CREATININE 4.4 (HH) 02/28/2017 1417      Component Value Date/Time   CALCIUM 10.4 02/28/2017 1417   ALKPHOS 85 02/28/2017 1417   AST 38 (H) 02/28/2017 1417   ALT 39 02/28/2017 1417   BILITOT 0.52 02/28/2017 1417       No results found for: LABCA2  No components found for: LABCA125  No results for input(s): INR in the last 168 hours.  Urinalysis    Component Value Date/Time   COLORURINE YELLOW 11/12/2015 [place] 11/12/2015 0837   LABSPEC 1.005 05/15/2016 1405   PHURINE 6.0 05/15/2016 1405   PHURINE 6.5 11/12/2015 0837   GLUCOSEU Negative 05/15/2016 1405   HGBUR Small 05/15/2016 1405   HGBUR MODERATE (A) 11/12/2015 0837   BILIRUBINUR Negative 05/15/2016 1405   KETONESUR Negative 05/15/2016 [place] 11/12/2015 0837   PROTEINUR Negative 05/15/2016 1405   PROTEINUR NEGATIVE 11/12/2015 0837   UROBILINOGEN 0.2 05/15/2016 1405   NITRITE Negative 05/15/2016 1405   NITRITE NEGATIVE 11/12/2015 0837   LEUKOCYTESUR Moderate 05/15/2016 1405    STUDIES: Bone density January 2018 shows T score of -1.7  PROTOCOLS: BWEL, ABC and PALLAS, [place] study  ASSESSMENT: 56 y.o. Alison [name] woman status post biopsy of 2 separate masses in the left breast upper outer quadrant 08/04/2015, clinically mT2 N1, stage IIb/IIIa invasive ductal carcinoma, grade 3, the larger mass being estrogen and progesterone receptor positive, HER-2 negative, with an MIB-1  of 12%  (a) biopsy of a left axillary lymph node 08/09/2015 was positive, with extracapsular extension  (1) neoadjuvant  chemotherapy started 08/25/2015 consisting of doxorubicin and cyclophosphamide in dose dense fashion 4, completed 10/06/2015,  followed by weekly paclitaxel 10 completed 12/23/2015  (a) final 2 planned cycles of weekly paclitaxel omitted because of neuropathy concerns  (2) status post left mastectomy with targeted axillary dissection 02/02/2016 for a ypT2 ypN2a, stage IIIa invasive ductal carcinoma, grade 2, with negative margins, repeat prognostic panel showing estrogen receptor positive, but progesterone receptor and HER-2 negative  (a) completion axillary lymph node dissection 02/14/2016 showed an additional 2 out of 8 lymph nodes to be involved (final count 6 out of 10 lymph nodes positive  (b) expander placement at the time of left mastectomy  (3) adjuvant capecitabine starting concurrently with radiation--discontinued 05/21/2016  (4) adjuvant  radiation started 04/02/2016, completed 05/21/2016  (5) anastrozole started October 2017  (a) bone density August 2016 showed osteopenia  (b) repeat bone density 09/07/2016 shows a T score of -1.7.  (c) the patient is status post total abdominal hysterectomy with bilateral salpingo-oophorectomy.  (6) postoperative pain?: Starting OxyContin 20 mg twice a day with oxycodone as needed for breakthrough pain 03/02/2016  (a) failed transition to tramadol, gabapentin, naproxen   (b) started methadone 5 mg TID as of 12/20/2016-- not tolerated  (c) robaxin added June 2018 to NSAIDS and tylenol  (7) Research:  (a) B-WEL study ([facility] [id number])--enrolled in Arm 1 (received education)  (b) PALLAS AFT-05 study: randomized to treatment, started palbociclib 07/26/2016   (i) dose reduced to 100 mg, then to 75 mg because of cytopenias   (ii) went off study study May 2018 because of persistent cytopenias  (c) taxane study 12/20/2016  (d) aspirin study  METASTATIC DISEASE 02/15/2017: CT scan of the lumbar spine and 02/27/2017 CT of the thoracic and cervical  spine shows multiple bone lesions but no evidence of cord compromise; bone lesions are confirmed on PET scan 02/22/2017 which however shows no evidence of visceral disease (8) the plan at this point is to continue anastrozole, add fulvestrant, denosumab/Xgeva, and abemaciclib  Other issues: (a) poorly controlled pain: Currently on OxyContin 10 mg twice daily plus breakthrough pain meds  (b) left upper lobe changes noted on PET scan 02/22/2017 felt to be secondary to radiation    PLAN: I spent approximately one hour today with Alison [name] and her husband reviewing her situation. We had already discussed the fact that stage IV breast cancer is not curable but we reviewed that today. They understand the goal is control and in that regard we have started her on Faslodex, abelaciclic, and denosumab/Xgeva. She has some discomfort from the delivery of the fulvestrant, but the fatigue and confusion she is experiencing is really due to her narcotics.  Today we dropped the narcotic dose from 40 mg of OxyContin 3 times a day to 2 times a day. I have asked her to take 1 Aleve with 1 Tylenol up to 4 times a day as needed for breakthrough pain. If that is insufficient then she will use one oxycodone, maximum every 8 hours.  I'm hoping with these changes she will become more functional. She understands but we are measuring is not her pain, which is not measurable, but her ability to function normally. If she does not function normally on her current pain meds, but instead does worsen before, then these are the wrong metastases and we need to start over.  She will not  qualify for the abelaciclib reimbursement and may need to change her insurance. She spent time today with our oral chemotherapy pharmacy specialist discussing that.  After reviewing the systemic treatment for her cancer, I suggested that local therapy with radiation to the areas most likely to be counseling her the most pain may also allow [place] to drop the  narcotic dose. I am placing a referral to Dr. Isidore Moos without in mind.  Arrielle's creatinine is up, doubtless because of the recent high doses of contrast for her CTs. I have asked her to drink 2 quarts per day and I am rechecking her labs early next week just to make sure things went back to normal.  Otherwise she will return to see me 03/19/2017 with her first Xgeva dose and her next fulvestrant dose. They know to call for any other issues that may develop before the next visit.   :Chauncey Cruel, MD   02/28/2017 6:38 PM

## 2017-02-28 ENCOUNTER — Other Ambulatory Visit (HOSPITAL_BASED_OUTPATIENT_CLINIC_OR_DEPARTMENT_OTHER): Payer: 59

## 2017-02-28 ENCOUNTER — Ambulatory Visit (HOSPITAL_BASED_OUTPATIENT_CLINIC_OR_DEPARTMENT_OTHER): Payer: 59 | Admitting: Oncology

## 2017-02-28 ENCOUNTER — Encounter: Payer: Self-pay | Admitting: Radiation Oncology

## 2017-02-28 VITALS — BP 88/53 | HR 99 | Temp 97.5°F | Resp 20 | Ht 62.0 in | Wt 152.8 lb

## 2017-02-28 DIAGNOSIS — Z79811 Long term (current) use of aromatase inhibitors: Secondary | ICD-10-CM | POA: Diagnosis not present

## 2017-02-28 DIAGNOSIS — C779 Secondary and unspecified malignant neoplasm of lymph node, unspecified: Principal | ICD-10-CM

## 2017-02-28 DIAGNOSIS — C50912 Malignant neoplasm of unspecified site of left female breast: Secondary | ICD-10-CM

## 2017-02-28 DIAGNOSIS — C50412 Malignant neoplasm of upper-outer quadrant of left female breast: Secondary | ICD-10-CM

## 2017-02-28 DIAGNOSIS — Z170 Estrogen receptor positive status [ER+]: Secondary | ICD-10-CM | POA: Diagnosis not present

## 2017-02-28 DIAGNOSIS — C7951 Secondary malignant neoplasm of bone: Secondary | ICD-10-CM

## 2017-02-28 DIAGNOSIS — R52 Pain, unspecified: Secondary | ICD-10-CM

## 2017-02-28 DIAGNOSIS — G893 Neoplasm related pain (acute) (chronic): Secondary | ICD-10-CM | POA: Diagnosis not present

## 2017-02-28 LAB — CBC WITH DIFFERENTIAL/PLATELET
BASO%: 0.5 % (ref 0.0–2.0)
BASOS ABS: 0 10*3/uL (ref 0.0–0.1)
EOS ABS: 0.1 10*3/uL (ref 0.0–0.5)
EOS%: 1.8 % (ref 0.0–7.0)
HCT: 31.2 % — ABNORMAL LOW (ref 34.8–46.6)
HEMOGLOBIN: 10.1 g/dL — AB (ref 11.6–15.9)
LYMPH%: 9.7 % — AB (ref 14.0–49.7)
MCH: 32.8 pg (ref 25.1–34.0)
MCHC: 32.4 g/dL (ref 31.5–36.0)
MCV: 101.3 fL — ABNORMAL HIGH (ref 79.5–101.0)
MONO#: 0.3 10*3/uL (ref 0.1–0.9)
MONO%: 5.6 % (ref 0.0–14.0)
NEUT%: 82.4 % — ABNORMAL HIGH (ref 38.4–76.8)
NEUTROS ABS: 4.7 10*3/uL (ref 1.5–6.5)
PLATELETS: 279 10*3/uL (ref 145–400)
RBC: 3.08 10*6/uL — ABNORMAL LOW (ref 3.70–5.45)
RDW: 13.4 % (ref 11.2–14.5)
WBC: 5.7 10*3/uL (ref 3.9–10.3)
lymph#: 0.6 10*3/uL — ABNORMAL LOW (ref 0.9–3.3)

## 2017-02-28 LAB — COMPREHENSIVE METABOLIC PANEL
ALBUMIN: 3.7 g/dL (ref 3.5–5.0)
ALK PHOS: 85 U/L (ref 40–150)
ALT: 39 U/L (ref 0–55)
ANION GAP: 11 meq/L (ref 3–11)
AST: 38 U/L — ABNORMAL HIGH (ref 5–34)
BILIRUBIN TOTAL: 0.52 mg/dL (ref 0.20–1.20)
BUN: 36.2 mg/dL — ABNORMAL HIGH (ref 7.0–26.0)
CO2: 26 mEq/L (ref 22–29)
Calcium: 10.4 mg/dL (ref 8.4–10.4)
Chloride: 99 mEq/L (ref 98–109)
Creatinine: 4.4 mg/dL (ref 0.6–1.1)
EGFR: 11 mL/min/{1.73_m2} — AB (ref >90)
Glucose: 119 mg/dl (ref 70–140)
Potassium: 5.6 mEq/L — ABNORMAL HIGH (ref 3.5–5.1)
Sodium: 135 mEq/L — ABNORMAL LOW (ref 136–145)
TOTAL PROTEIN: 7.3 g/dL (ref 6.4–8.3)

## 2017-02-28 MED ORDER — OXYCODONE HCL 15 MG PO TABS: 15.0000 mg | ORAL_TABLET | Freq: Three times a day (TID) | ORAL | 0 refills | Status: DC | PRN

## 2017-02-28 NOTE — Telephone Encounter (Signed)
Oral Chemotherapy Pharmacist Encounter  Received notification from [facility] that patient's application for manufacturer assistance has been denied as their family is over income limit for program. The office will attempt appeal of denial by writing a letter of hardship and providing documentation that the patient is responsible for the full price of the medication ($12,257.47 per bottle; $147,000 for 1 year's worth). Patient and husband updated in exam room. Patient's husband will look into acquiring additional prescription insurance coverage in case we are not able to have the manufacturer denial overturned. We will update patient and husband when appeal has been sent in to [facility].  This encounter will continue to be updated until final determination.  Oral Oncology Clinic will continue to follow.  Johny Drilling, PharmD, BCPS, BCOP 02/28/2017  4:26 PM Oral Oncology Clinic [phone]

## 2017-03-01 ENCOUNTER — Ambulatory Visit: Payer: 59 | Admitting: Oncology

## 2017-03-01 ENCOUNTER — Telehealth: Payer: Self-pay | Admitting: Oncology

## 2017-03-01 ENCOUNTER — Other Ambulatory Visit: Payer: 59

## 2017-03-01 LAB — CANCER ANTIGEN 27.29: CA 27.29: 23.2 U/mL (ref 0.0–38.6)

## 2017-03-01 NOTE — Telephone Encounter (Signed)
lvm to inform pt of lab only appt 7/10 at 1430 per sch msg

## 2017-03-05 ENCOUNTER — Other Ambulatory Visit: Payer: 59

## 2017-03-05 NOTE — Telephone Encounter (Signed)
Oral Oncology Patient Advocate Encounter  Received an update that the patient and her husband have been working with their insurance company to obtain better coverage for her [employer].  The insurance plan's coverage for Alison [name] has changed.  The plan is now returning a copayment of $2451.49 monthly.  (previously it was paying zero and causing the patient to have to pay $12,257.47 monthly)  I have spoken with Alison [name] and informed him of the new copayment.  He is aware that the copay card that is in place will cover the next 2 months in copay expense.  After that period of time, the $2451.49 monthly cost will be their responsibility.  Also, I explained that I do not know if that copay amount will change from month to month.   Since the copay for the Verzenio has been substantially reduced, we are not going to proceed any further with the appeal to [employer] for manufacturer assistance as we agree those efforts would be futile.   Alison [name] voiced understanding and agrees with the plan.   Alison [name] Patient [place] 03/05/2017 1:43 PM

## 2017-03-06 ENCOUNTER — Telehealth: Payer: Self-pay | Admitting: Oncology

## 2017-03-06 ENCOUNTER — Telehealth: Payer: Self-pay

## 2017-03-06 NOTE — Telephone Encounter (Signed)
Spoke with pt by phone regarding cancelled lab appt for 03/05/17.  Pt states she is out of town until 03/15/17 and that Dr [name] is aware.  Pt currently scheduled to see Dr [name] and have labs drawn on 03/19/17.  Due to abnormal Crt  level this RN will have pt scheduled for labs on 03/15/17.  Pt agrees to this.  Pt reminded to maintain increased fluid intake d/t recent contrast from scans and to call this office with any symptoms of illness while she is out of town for further instruction.  Pt verbalizes understanding of instructions.

## 2017-03-06 NOTE — Telephone Encounter (Signed)
LVM TO INFORM PT OF 7/20 LAB APPT PER [facility]

## 2017-03-08 ENCOUNTER — Other Ambulatory Visit: Payer: Self-pay | Admitting: *Deleted

## 2017-03-12 NOTE — Progress Notes (Signed)
Histology and Location of Primary Cancer:  Stage III  invasive ductal carcinoma (ER positive, PR negative, HER2 negative) of the left breast diagnosed by biopsy 08/04/15  Sites of Visceral and Bony Metastatic Disease:  02/22/17 PET [place]  Widespread bony metastatic disease is identified affecting the proximal femurs, the right scapula, multiple ribs, the base of skull on the left, the cervical/thoracic/lumbar spine, the iliac bones, the remainder of the pelvic bones, and at least the proximal right femur. A focus over the left upper arm is incompletely evaluated but appears to be in the soft tissues, nonspecific.  IMPRESSION: 1. Widespread bony metastatic disease as above. 2. Linear opacity in the left upper lobe extending from the suprahilar region to the pleura with low level uptake is thought to be postradiation change and/or atelectasis. 3. No other FDG avid abnormalities identified  Location(s) of Symptomatic Metastases:   Past/Anticipated chemotherapy by medical oncology, if any:  Dr. [name] last saw 02/28/17 After reviewing the systemic treatment for her cancer, I suggested that local therapy with radiation to the areas most likely to be counseling her the most pain may also allow [place] to drop the narcotic dose. I am placing a referral to Dr. Isidore Moos without in mind.  Otherwise she will return to see me 03/19/2017 with her first Xgeva dose and her next fulvestrant dose. They know to call for any other issues that may develop before the next visit.  03/19/17 Dr. [name] As far as her treatment is concerned, she continues on anastrozole and has been on abemaciclib since June 29. She has not had any side effects from it that she is aware of.  She was to have started her Delton See treatment today, but she would like to receive it together with fulvestrant to consolidate therapy and she would like to move her fulvestrant to Tuesdays. Accordingly her next treatment of fulvestrant and  her first dose of denosumab/Xgeva will be 03/26/2017.  They have contracted with [facility] for chart review and long-distance second opinion. We will be glad to facilitate that.  She is going to have her expander removed and an implant placed 04/04/2017.   Pain on a scale of 0-10 is:  She rates her pain a 4/10 to her lower back to bilateral areas.  She reports heat will help the pain to this area.    If Spine Met(s), symptoms, if any, include:  Bowel/Bladder retention or incontinence (please describe): No  Numbness or weakness in extremities (please describe): No  Current Decadron regimen, if applicable: N/A  Ambulatory status? Walker? Wheelchair?: Ambulatory  SAFETY ISSUES: Prior radiation? Yes, Radiation treatment dates:   04/02/2016-05/21/2016 --Site/dose:   1) Left Chest Wall, SCV, Axilla, IM nodes / 50.4Gy in 28 fractions (IM nodes receiving 45Gy+) ; 2) Left Chest wall scar boost / 10 Gy in 5 fractions  Pacemaker/ICD? No  Possible current pregnancy? No  Is the patient on methotrexate? No  Current Complaints / other details:    BP (!) 128/57   Pulse (!) 108   Temp 98.2 F (36.8 C)   Wt 156 lb 6.4 oz (70.9 kg)   SpO2 96% Comment: room air  BMI 28.61 kg/m    Wt Readings from Last 3 Encounters:  03/20/17 156 lb 6.4 oz (70.9 kg)  03/19/17 156 lb 12.8 oz (71.1 kg)  02/28/17 152 lb 12.8 oz (69.3 kg)

## 2017-03-13 ENCOUNTER — Telehealth: Payer: Self-pay | Admitting: Oncology

## 2017-03-13 NOTE — Telephone Encounter (Signed)
Faxed records to md [place]

## 2017-03-14 ENCOUNTER — Other Ambulatory Visit: Payer: Self-pay

## 2017-03-14 MED ORDER — OXYCODONE HCL ER 40 MG PO T12A: 40.0000 mg | EXTENDED_RELEASE_TABLET | Freq: Two times a day (BID) | ORAL | 0 refills | Status: DC

## 2017-03-14 MED ORDER — OXYCODONE HCL 15 MG PO TABS: 15.0000 mg | ORAL_TABLET | Freq: Three times a day (TID) | ORAL | 0 refills | Status: DC | PRN

## 2017-03-15 ENCOUNTER — Other Ambulatory Visit: Payer: 59

## 2017-03-15 MED FILL — VERZENIO 100 MG TAB: 100 | 28 days supply | Qty: 56 | Fill #1

## 2017-03-18 ENCOUNTER — Ambulatory Visit (HOSPITAL_BASED_OUTPATIENT_CLINIC_OR_DEPARTMENT_OTHER): Payer: 59

## 2017-03-18 ENCOUNTER — Ambulatory Visit: Payer: 59 | Admitting: Oncology

## 2017-03-18 ENCOUNTER — Telehealth: Payer: Self-pay | Admitting: *Deleted

## 2017-03-18 ENCOUNTER — Other Ambulatory Visit: Payer: 59

## 2017-03-18 DIAGNOSIS — C7951 Secondary malignant neoplasm of bone: Secondary | ICD-10-CM

## 2017-03-18 DIAGNOSIS — C50412 Malignant neoplasm of upper-outer quadrant of left female breast: Secondary | ICD-10-CM

## 2017-03-18 DIAGNOSIS — Z170 Estrogen receptor positive status [ER+]: Principal | ICD-10-CM

## 2017-03-18 DIAGNOSIS — R52 Pain, unspecified: Secondary | ICD-10-CM

## 2017-03-18 LAB — URINALYSIS, MICROSCOPIC - CHCC
Bilirubin (Urine): NEGATIVE
GLUCOSE UR CHCC: NEGATIVE mg/dL
KETONES: NEGATIVE mg/dL
Nitrite: NEGATIVE
PROTEIN: NEGATIVE mg/dL
Specific Gravity, Urine: 1.005 (ref 1.003–1.035)
UROBILINOGEN UR: 0.2 mg/dL (ref 0.2–1)
pH: 6 (ref 4.6–8.0)

## 2017-03-18 NOTE — Telephone Encounter (Addendum)
"  I woke up this morning with a bladder infection.  Feel I have to go all the time.  Burns a few seconds when I start, gets better, burns again.  Tried AZO.  Call something in to [facility]?  [phone]."  With return call describes as "Feeling Cystitis.  No recent intercourse.  No fever.  No change in color of urine or blood in urine.  Every time I've gone this morning I feel burning sensation.  I am eating and drinking well."  Will add UA/CS for F/U tomorrow am scheduled lab/MD F/U and Faslodex injection.  "You can't just call something in.  I don't remember the last time this was done but they've called something in before."    05-15-2016 UA/CS results positive for moderate Bacteria.  Cipro 500 mg, take 1 twice daily in medicine history.

## 2017-03-18 NOTE — Telephone Encounter (Signed)
Urinalysis ordered for today.  Called patient who asked to come in at 12:00 pm.  Scheduling message sent.  Asked scheduler for 12:15.

## 2017-03-18 NOTE — Telephone Encounter (Signed)
Results provided to on-call provider, no new orders at this time.  Instructed to continue AZO, drink water provide update with F/U tomorrow.

## 2017-03-18 NOTE — Progress Notes (Signed)
[facility]  [phone] [phone]   ID: Alison [name] DOB: 05-07-1961  MR#: [id number]  CXK#:[id number]  Patient Care Team: Alison Pao, MD as PCP - General (Internal Medicine) Alison Luna, MD as Consulting Physician (General Surgery) Alison [name], Alison Dad, MD as Consulting Physician (Oncology) Alison Gell, MD as Consulting Physician (Obstetrics and Gynecology) Alison Sine, MD as Consulting Physician (Dermatology) Alison Norway, RN as Registered Nurse (Oncology) Alison Gibson, MD as Attending Physician (Radiation Oncology) PCP: Alison Pao, MD OTHER MD:  CHIEF COMPLAINT: Estrogen receptor positive breast cancer  CURRENT TREATMENT: Anastrozole, fulvestrant, abemaciclib, denosumab/Xgeva  INTERVAL HISTORY: Alison [name] returns today for follow-up of treatment of her estrogen receptor positive breast cancer accompanied by her husband. She continues on anastrozole, generally with good tolerance. She is now about 2 weeks into abemaciclib. She is tolerating this with no side effects that she is aware of.  She does feel fatigued after receiving the fulvestrant. Aside from that and the discomfort of the injection itself, she tolerates it well. Our graft she is due to start denosumab today.  REVIEW OF SYSTEMS:  Alison [name] was very confused at our last visit. She is not "much better". She thinks she map taken an extra pain medicine or perhaps an extra Ativan that. She says she was also dehydrated. She is now taking OxyContin 40 mg every 12 hours. She still requires 30 mg of oxycodone 4 times a day to control the pain. She does tell me that when she takes a pain medicine she is able to do more. She is constipated and does not like the MiraLAX. She would like to try stool softeners alone. She tells me she has been doing some crying and is feeling a little bit more depressed. Otherwise a detailed review of systems today was stable  BREAST CANCER  HISTORY: From the original intake note:  Alison [name] had routine screening mammography with tomography [facility] February 20 11/14/2014 showing a possible mass in the right breast. Diagnostic right mammography with right breast ultrasonography 11/01/2014 found the breast density to be category C. In the upper outer quadrant of the right breast there was a partially obscured mass which on physical exam was palpable as an area of thickening at the 10:00 position 8 cm from the nipple. Ultrasound showed multiple cysts in the upper outer right breast corresponding to the mass in question.  In November 2016 the patient felt a change in the upper left breast and brought it to her primary care physician's attention. Diagnostic left mammography with tomosynthesis and left breast ultrasonography at the [facility] December 01/14/2015 found trabecular thickening throughout the left breast with skin thickening, but no circumscribed mass or suspicious calcifications. A rounded mass in the left axilla measured 6 mm. There was an additional mildly prominent lymph node in the left axilla which was what the patient had been palpating. Ultrasonography showed ill-defined swelling throughout the inner left breast with overlying skin thickening.. At the 2:00 position 4 cm from the nipple there was an irregular hypoechoic mass measuring 2.3 cm. A second mass at the 1:00 area 3 cm from the nipple measured 1 cm. The distance between these 2 masses was 1.8 cm. There was also a round hypoechoic left axillary mass measuring 0.6 cm. Overall the was an area of 3.7 cm of mixed echogenicity corresponding to the area of palpable soft tissue swelling.   On 08/04/2015 the patient underwent biopsy of the mass at the 2:00 axis 4  cm from the nipple and a second biopsy was performed of the hypoechoic mass at the 1:00 position 3 cm from the nipple. Biopsy of the suspicious nodule in the lower left axilla was attempted but could not be  completed as the mass became obscured after administration of lidocaine.  The pathology from both left breast biopsies 08/04/2015 ([id number]) showed invasive ductal carcinoma, grade 3. The prognostic profile obtained from the larger tumor showed estrogen receptor to be strongly positive at 95%, progesterone receptor positive at 30%, with an MIB-1 of 12%, and no HER-2 amplification, the signals ratio being 1.22 and the number per cell 2.20.  On 08/09/2015 the patient underwent biopsy of this suspicious left axillary lymph node noted above, and this showed ([id number]) metastatic carcinoma with extracapsular extension.  The patient's subsequent history is as detailed below    PAST MEDICAL HISTORY: Past Medical History:  Diagnosis Date  . Asthma     seasonal asthma  . Breast cancer ([place])   . Complication of anesthesia 2007   aspiration pneumonia post hysterectomy.  Blood pressure would drop low- but no problems 01/2016- surgery  . History of blood transfusion   . History of mitral valve prolapse 1988   with palpitations  . History of radiation therapy 04/02/16- 05/21/16   Left Chest Wall, SCV, Axilla, IM nodes  . Hypertension   . Neuropathy ([place])    with chemo  . Osteopenia determined by x-ray   . Pneumonia    1980's and 1990's  . Raynaud's disease   . 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 Skin cancer 03/12/2014   basal- back    PAST SURGICAL HISTORY: Past Surgical History:  Procedure Laterality Date  . ABDOMINAL HYSTERECTOMY  2007   with BSO  . APPENDECTOMY  2007  . AXILLARY LYMPH NODE DISSECTION Left 02/14/2016   Procedure: LEFT AXILLARY LYMPH NODE DISSECTION;  Surgeon: [name], MD;  Location: [facility];  Service: General;  Laterality: Left;  . BREAST RECONSTRUCTION WITH PLACEMENT OF TISSUE EXPANDER AND FLEX HD (ACELLULAR HYDRATED DERMIS) Left 02/02/2016   Procedure: IMMEDIATE LEFT BREAST RECONSTRUCTION WITH PLACEMENT OF TISSUE EXPANDER  AND FLEX HD (ACELLULAR HYDRATED DERMIS);  Surgeon: [name], DO;  Location: [facility];  Service: Plastics;  Laterality: Left;  . BREAST SURGERY Left 02/02/16   mastectomy with reconstruction  . c-section  1991  . COLON RESECTION SIGMOID  12/02/2012   [facility]   . HERNIA REPAIR  2007  . LAPAROSCOPIC ENDOMETRIOSIS FULGURATION  1984, 1989  . Port- a- cath placement  07/2015  . PORT-A-CATH REMOVAL Right 02/02/2016   Procedure: REMOVAL PORT-A-CATH;  Surgeon: [name], MD;  Location: [facility];  Service: General;  Laterality: Right;  . RADIOACTIVE SEED GUIDED AXILLARY SENTINEL LYMPH NODE Left 02/02/2016   Procedure: LEFT BREAST MASTECTOMY AND RADIOACTIVE SEED GUIDED LEFT SENTINEL LYMPH NODE BIOPSY ;  Surgeon: [name], MD;  Location: [facility];  Service: General;  Laterality: Left;    FAMILY HISTORY Family History  Problem Relation Age of Onset  . Lung cancer Father    the patient's father died from lung cancer at the age of 56 in the setting of tobacco abuse. The patient's mother died at the age of 58 with pancreatic cancer. The patient had no siblings. There is no history of breast or ovarian cancer in the family.  GYNECOLOGIC HISTORY:  No LMP recorded. Patient has had a hysterectomy. Menarche  age 72, first live birth age 29. The patient is GX P1. She is status post total hysterectomy with bilateral salpingo-oophorectomy. She has been on hormone replacement since that time and is tapering to off at the time of this dictation.   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 She used to work at [employer]. Her husband [name] works for [employer] division at a [employer] their son [name] is currently at home.    ADVANCED DIRECTIVES: Not in place   HEALTH MAINTENANCE: Social History  Substance Use Topics  . Smoking status: Former Smoker    Packs/day: 1.00    Years: 10.00    Types: Cigarettes    Quit date:  11/25/2008  . Smokeless tobacco: Never Used     Comment: on and off smoker never consistent  . Alcohol use 2.4 oz/week    4 Glasses of wine per week     Colonoscopy: April 2014/ Raynaldo Opitz [facility]  PAP: s/p hysterectomy  Bone density: August 2016/ osteopenia  Lipid panel:  Allergies  Allergen Reactions  . Sulfa Antibiotics Other (See Comments)    Blisters in mouth  . Zofran [Ondansetron Hcl] Other (See Comments)    Severe Headaches     Current Outpatient Prescriptions  Medication Sig Dispense Refill  . Abemaciclib 100 MG TABS Take 100 mg by mouth 2 (two) times daily. 60 tablet 3  . anastrozole (ARIMIDEX) 1 MG tablet Take 1 mg by mouth daily.    . cetirizine (ZYRTEC) 10 MG tablet Take 10 mg by mouth daily.    . Cholecalciferol (VITAMIN D3) 2000 UNITS capsule Take 2,000 Units by mouth daily.     Marland Kitchen escitalopram (LEXAPRO) 10 MG tablet Take 1 tablet (10 mg total) by mouth daily. 30 tablet 12  . fluticasone (FLONASE) 50 MCG/ACT nasal spray Place 2 sprays into both nostrils daily. Reported on 11/18/2015    . gabapentin (NEURONTIN) 300 MG capsule Take 1 capsule (300 mg total) by mouth at bedtime as needed. Take 2 tablets in AM, 2 tablets mid-day, and 4 tablets at bedtime 90 capsule 4  . lidocaine (LIDODERM) 5 % APPLY ONE PATCH DAILY -REMOVE AND DISCARD WITHIN 12 HOURS OR AS DIRECTED BY MD 30 patch 0  . LORazepam (ATIVAN) 0.5 MG tablet Take 1 tablet (0.5 mg total) by mouth at bedtime. 30 tablet 3  . Multiple Vitamin (MULTIVITAMIN) tablet Take 1 tablet by mouth daily.    Marland Kitchen omega-3 acid ethyl esters (LOVAZA) 1 g capsule Take 1 g by mouth 2 (two) times daily.    Marland Kitchen oxyCODONE (OXYCONTIN) 40 mg 12 hr tablet Take 1 tablet (40 mg total) by mouth 3 (three) times daily. 90 tablet 0  . oxyCODONE (ROXICODONE) 15 MG immediate release tablet Take 1 tablet (15 mg total) by mouth every 8 (eight) hours as needed for pain. 120 tablet 0  . Probiotic Product (PROBIOTIC DAILY PO) Take 1 capsule by mouth daily.  [place] prochlorperazine (COMPAZINE) 10 MG tablet Take 1 tablet (10 mg total) by mouth every 6 (six) hours as needed (Nausea or vomiting). 30 tablet 2  . traZODone (DESYREL) 50 MG tablet Take 3 tablets (150 mg total) by mouth at bedtime. 90 tablet 0   No current facility-administered medications for this visit.     OBJECTIVE: Middle-aged white woman In no acute distress  Vitals:   03/19/17 1538  BP: (!) 140/59  Pulse: (!) 101  Resp: 18  Temp: 98.3 F (36.8 C)     Body mass index is 28.68 kg/m.    ECOG FS:1 - Symptomatic but completely ambulatory  Sclerae unicteric, EOMs intact Oropharynx clear and moist No cervical or supraclavicular adenopathy Lungs no rales or rhonchi Heart regular rate and rhythm Abd soft, nontender, positive bowel sounds MSK no focal spinal tenderness, no upper extremity lymphedema Neuro: nonfocal, well oriented, appropriate affect Breasts: Deferred   LAB RESULTS:  CMP     Component Value Date/Time   NA 138 03/19/2017 1420   K 4.4 03/19/2017 1420   CL 110 02/15/2016 0330   CO2 23 03/19/2017 1420   GLUCOSE 111 03/19/2017 1420   BUN 8.6 03/19/2017 1420   CREATININE 0.9 03/19/2017 1420   CALCIUM 9.3 03/19/2017 1420   PROT 6.9 03/19/2017 1420   ALBUMIN 3.5 03/19/2017 1420   AST 20 03/19/2017 1420   ALT 18 03/19/2017 1420   ALKPHOS 109 03/19/2017 1420   BILITOT 0.47 03/19/2017 1420   GFRNONAA >60 02/15/2016 0330   GFRAA >60 02/15/2016 0330    INo results found for: SPEP, UPEP  Lab Results  Component Value Date   WBC 2.6 (L) 03/19/2017   NEUTROABS 1.6 03/19/2017   HGB 10.3 (L) 03/19/2017   HCT 30.5 (L) 03/19/2017   MCV 98.2 03/19/2017   PLT 240 03/19/2017      Chemistry      Component Value Date/Time   NA 138 03/19/2017 1420   K 4.4 03/19/2017 1420   CL 110 02/15/2016 0330   CO2 23 03/19/2017 1420   BUN 8.6 03/19/2017 1420   CREATININE 0.9 03/19/2017 1420      Component Value  Date/Time   CALCIUM 9.3 03/19/2017 1420   ALKPHOS 109 03/19/2017 1420   AST 20 03/19/2017 1420   ALT 18 03/19/2017 1420   BILITOT 0.47 03/19/2017 1420       No results found for: LABCA2  No components found for: [id number]  No results for input(s): INR in the last 168 hours.  Urinalysis    Component Value Date/Time   COLORURINE YELLOW 11/12/2015 [place] 11/12/2015 0837   LABSPEC 1.005 03/18/2017 1223   PHURINE 6.0 03/18/2017 1223   PHURINE 6.5 11/12/2015 0837   GLUCOSEU Negative 03/18/2017 1223   HGBUR Large 03/18/2017 1223   HGBUR MODERATE (A) 11/12/2015 0837   BILIRUBINUR Negative 03/18/2017 1223   KETONESUR Negative 03/18/2017 [place] 11/12/2015 0837   PROTEINUR Negative 03/18/2017 1223   PROTEINUR NEGATIVE 11/12/2015 0837   UROBILINOGEN 0.2 03/18/2017 1223   NITRITE Negative 03/18/2017 1223   NITRITE NEGATIVE 11/12/2015 0837   LEUKOCYTESUR Moderate 03/18/2017 1223    STUDIES: Bone density January 2018 shows T score of -1.7  PROTOCOLS: BWEL, ABC and PALLAS, [place] study  ASSESSMENT: 56 y.o. [place] woman status post biopsy of 2 separate masses in the left breast upper outer quadrant 08/04/2015, clinically mT2 N1, stage IIb/IIIa invasive ductal carcinoma, grade 3, the larger mass being estrogen and progesterone receptor positive, HER-2 negative, with an MIB-1 of 12%  (a) biopsy of a left axillary lymph node 08/09/2015 was positive, with extracapsular extension  (1) neoadjuvant chemotherapy started 08/25/2015 consisting of doxorubicin and cyclophosphamide in dose dense fashion 4, completed 10/06/2015,  followed by weekly paclitaxel 10 completed 12/23/2015  (a) final 2 planned cycles of weekly paclitaxel omitted because of neuropathy concerns  (2) status post left mastectomy with targeted axillary dissection 02/02/2016 for a ypT2 ypN2a, stage  IIIa invasive ductal carcinoma, grade 2, with negative margins, repeat prognostic panel  showing estrogen receptor positive, but progesterone receptor and HER-2 negative  (a) completion axillary lymph node dissection 02/14/2016 showed an additional 2 out of 8 lymph nodes to be involved (final count 6 out of 10 lymph nodes positive  (b) expander placement at the time of left mastectomy  (3) adjuvant capecitabine starting concurrently with radiation--discontinued 05/21/2016  (4) adjuvant  radiation started 04/02/2016, completed 05/21/2016  (5) anastrozole started October 2017  (a) bone density August 2016 showed osteopenia  (b) repeat bone density 09/07/2016 shows a T score of -1.7.  (c) the patient is status post total abdominal hysterectomy with bilateral salpingo-oophorectomy.  (6) postoperative pain?: Starting OxyContin 20 mg twice a day with oxycodone as needed for breakthrough pain 03/02/2016  (a) failed transition to tramadol, gabapentin, naproxen   (b) started methadone 5 mg TID as of 12/20/2016-- not tolerated  (c) robaxin added June 2018 to NSAIDS and tylenol  (7) Research:  (a) B-WEL study ([facility] [id number])--enrolled in Arm 1 (received education)  (b) PALLAS AFT-05 study: randomized to treatment, started palbociclib 07/26/2016   (i) dose reduced to 100 mg, then to 75 mg because of cytopenias   (ii) went off study study May 2018 because of persistent cytopenias  (c) taxane study 12/20/2016  (d) aspirin study  METASTATIC DISEASE 02/15/2017: CT scan of the lumbar spine and 02/27/2017 CT of the thoracic and cervical spine shows multiple bone lesions but no evidence of cord compromise; bone lesions are confirmed on PET scan 02/22/2017 which however shows no evidence of visceral disease  (a) CA-27-29 is noninformative  (8) continuing anastrozole, abemaciclib added 02/22/2017, fulvestrant started 02/22/2017 ,   (9) yearly zolendronate given 09/26/2016, changed to monthly denosumab/Xgeva starting 03/19/2017  Other issues: (a) poorly controlled pain: Currently on  OxyContin 10 mg twice daily plus breakthrough pain meds  (b) left upper lobe changes noted on PET scan 02/22/2017 felt to be secondary to radiation    PLAN: Alison situation is complex and we spent well over 40 minutes today trying to straighten out the various strands of treatment and evaluation.  As far as the studies that she is participating in is concerned, she has one last visit to the PALLAS trial and that has been scheduled for August 7. She is continuing to receive instructions through the [facility] protocol. The ABC study finally stopped 02/20/2017.  As far as her treatment is concerned, she continues on anastrozole and has been on abemaciclib since June 29. She has not had any side effects from it that she is aware of.  She was to have started her Delton See treatment today, but she would like to receive it together with fulvestrant to consolidate therapy and she would like to move her fulvestrant to Tuesdays. Accordingly her next treatment of fulvestrant and her first dose of denosumab/Xgeva will be 03/26/2017.  They have contracted with [facility] for chart review and long-distance second opinion. We will be glad to facilitate that.  She is going to have her expander removed and an implant placed 04/04/2017.  She is showing signs of depression and relying too much on benzodiazepines. I suggested we start an antidepressant. She tells me she has had letrozole in the past and we started that today. She will let me know if she has any side effects from it.  Otherwise she will return to see me on August 28, with a second dose of denosumab. She knows to  call for any problems that may develop before that visit.    :Chauncey Cruel, MD   03/19/2017 8:23 PM

## 2017-03-19 ENCOUNTER — Other Ambulatory Visit (HOSPITAL_BASED_OUTPATIENT_CLINIC_OR_DEPARTMENT_OTHER): Payer: 59

## 2017-03-19 ENCOUNTER — Encounter: Payer: 59 | Admitting: *Deleted

## 2017-03-19 ENCOUNTER — Other Ambulatory Visit: Payer: Self-pay | Admitting: *Deleted

## 2017-03-19 ENCOUNTER — Other Ambulatory Visit: Payer: 59

## 2017-03-19 ENCOUNTER — Ambulatory Visit (HOSPITAL_BASED_OUTPATIENT_CLINIC_OR_DEPARTMENT_OTHER): Payer: 59 | Admitting: Oncology

## 2017-03-19 ENCOUNTER — Ambulatory Visit: Payer: 59

## 2017-03-19 VITALS — BP 140/59 | HR 101 | Temp 98.3°F | Resp 18 | Ht 62.0 in | Wt 156.8 lb

## 2017-03-19 DIAGNOSIS — Z006 Encounter for examination for normal comparison and control in clinical research program: Secondary | ICD-10-CM | POA: Diagnosis not present

## 2017-03-19 DIAGNOSIS — C779 Secondary and unspecified malignant neoplasm of lymph node, unspecified: Principal | ICD-10-CM

## 2017-03-19 DIAGNOSIS — C50912 Malignant neoplasm of unspecified site of left female breast: Secondary | ICD-10-CM

## 2017-03-19 DIAGNOSIS — C7951 Secondary malignant neoplasm of bone: Secondary | ICD-10-CM | POA: Diagnosis not present

## 2017-03-19 DIAGNOSIS — C50412 Malignant neoplasm of upper-outer quadrant of left female breast: Secondary | ICD-10-CM | POA: Diagnosis not present

## 2017-03-19 DIAGNOSIS — F329 Major depressive disorder, single episode, unspecified: Secondary | ICD-10-CM

## 2017-03-19 DIAGNOSIS — Z79811 Long term (current) use of aromatase inhibitors: Secondary | ICD-10-CM | POA: Diagnosis not present

## 2017-03-19 DIAGNOSIS — Z170 Estrogen receptor positive status [ER+]: Secondary | ICD-10-CM

## 2017-03-19 LAB — CBC WITH DIFFERENTIAL/PLATELET
BASO%: 2.7 % — ABNORMAL HIGH (ref 0.0–2.0)
BASOS ABS: 0.1 10*3/uL (ref 0.0–0.1)
EOS%: 4.2 % (ref 0.0–7.0)
Eosinophils Absolute: 0.1 10*3/uL (ref 0.0–0.5)
HEMATOCRIT: 30.5 % — AB (ref 34.8–46.6)
HGB: 10.3 g/dL — ABNORMAL LOW (ref 11.6–15.9)
LYMPH#: 0.6 10*3/uL — AB (ref 0.9–3.3)
LYMPH%: 21.5 % (ref 14.0–49.7)
MCH: 33.2 pg (ref 25.1–34.0)
MCHC: 33.8 g/dL (ref 31.5–36.0)
MCV: 98.2 fL (ref 79.5–101.0)
MONO#: 0.3 10*3/uL (ref 0.1–0.9)
MONO%: 9.9 % (ref 0.0–14.0)
NEUT#: 1.6 10*3/uL (ref 1.5–6.5)
NEUT%: 61.7 % (ref 38.4–76.8)
PLATELETS: 240 10*3/uL (ref 145–400)
RBC: 3.11 10*6/uL — ABNORMAL LOW (ref 3.70–5.45)
RDW: 14.7 % — ABNORMAL HIGH (ref 11.2–14.5)
WBC: 2.6 10*3/uL — ABNORMAL LOW (ref 3.9–10.3)

## 2017-03-19 LAB — URINE CULTURE

## 2017-03-19 LAB — COMPREHENSIVE METABOLIC PANEL
ALT: 18 U/L (ref 0–55)
AST: 20 U/L (ref 5–34)
Albumin: 3.5 g/dL (ref 3.5–5.0)
Alkaline Phosphatase: 109 U/L (ref 40–150)
Anion Gap: 10 mEq/L (ref 3–11)
BUN: 8.6 mg/dL (ref 7.0–26.0)
CALCIUM: 9.3 mg/dL (ref 8.4–10.4)
CHLORIDE: 106 meq/L (ref 98–109)
CO2: 23 mEq/L (ref 22–29)
Creatinine: 0.9 mg/dL (ref 0.6–1.1)
EGFR: 69 mL/min/{1.73_m2} — AB (ref >90)
Glucose: 111 mg/dl (ref 70–140)
POTASSIUM: 4.4 meq/L (ref 3.5–5.1)
Sodium: 138 mEq/L (ref 136–145)
Total Bilirubin: 0.47 mg/dL (ref 0.20–1.20)
Total Protein: 6.9 g/dL (ref 6.4–8.3)

## 2017-03-19 MED ORDER — OXYCODONE HCL ER 40 MG PO T12A: 40.0000 mg | EXTENDED_RELEASE_TABLET | Freq: Three times a day (TID) | ORAL | 0 refills | Status: DC

## 2017-03-19 MED ORDER — LORAZEPAM 0.5 MG PO TABS: 0.5000 mg | ORAL_TABLET | Freq: Every day | ORAL | 3 refills | Status: DC

## 2017-03-19 MED ORDER — TRAZODONE HCL 50 MG PO TABS: 150.0000 mg | ORAL_TABLET | Freq: Every day | ORAL | 0 refills | Status: DC

## 2017-03-19 MED ORDER — OXYCODONE HCL 15 MG PO TABS: 15.0000 mg | ORAL_TABLET | Freq: Three times a day (TID) | ORAL | 0 refills | Status: DC | PRN

## 2017-03-19 MED ORDER — ESCITALOPRAM OXALATE 10 MG PO TABS: 10.0000 mg | ORAL_TABLET | Freq: Every day | ORAL | 12 refills | Status: DC

## 2017-03-19 NOTE — Progress Notes (Signed)
03/19/2017 AFT-05 PALLAS End of Treatment Phase Patient in to clinic today for follow-up visit with Dr. [name]. Discussed AFT-05 PALLAS study end of treatment phase with patient, specifically regarding collection of blood samples for research purposes only (research samples and HgbA1c testing). Patient is in agreement to have these collected tomorrow morning following her Radiation Oncology visit with Dr. Isidore Moos. Patient is aware that she will continue in the Follow-up Phase II for the AFT-05 PALLAS study with minimal reporting requirements. Thanked patient for her participation in the study.  [facility] [id number] ABC and [phone] BWEL studies - Follow-up Patient is aware that site is working with study staff to clarify necessary assessments in the follow-up phase, including need for any additional questionnaires or blood samples at future dates. Patient is in agreement with continued follow-up. In addition, patient reiterated her desire to remain in the Health Education group and to continue receiving materials, as allowed per the protocol.  Cindy S. Brigitte Pulse BSN, RN, [facility] 03/19/2017 4:43 PM

## 2017-03-20 ENCOUNTER — Encounter: Payer: Self-pay | Admitting: Radiation Oncology

## 2017-03-20 ENCOUNTER — Ambulatory Visit
Admission: RE | Admit: 2017-03-20 | Discharge: 2017-03-20 | Disposition: A | Discharge status: Home / Self Care | Payer: 59 | Source: Ambulatory Visit | Attending: Radiation Oncology | Admitting: Radiation Oncology

## 2017-03-20 ENCOUNTER — Other Ambulatory Visit: Payer: Self-pay

## 2017-03-20 ENCOUNTER — Other Ambulatory Visit: Payer: 59

## 2017-03-20 DIAGNOSIS — Z170 Estrogen receptor positive status [ER+]: Principal | ICD-10-CM

## 2017-03-20 DIAGNOSIS — Z882 Allergy status to sulfonamides status: Secondary | ICD-10-CM | POA: Diagnosis not present

## 2017-03-20 DIAGNOSIS — Z510 Encounter for antineoplastic radiation therapy: Secondary | ICD-10-CM | POA: Diagnosis present

## 2017-03-20 DIAGNOSIS — C7951 Secondary malignant neoplasm of bone: Secondary | ICD-10-CM | POA: Diagnosis not present

## 2017-03-20 DIAGNOSIS — Z888 Allergy status to other drugs, medicaments and biological substances status: Secondary | ICD-10-CM | POA: Insufficient documentation

## 2017-03-20 DIAGNOSIS — Z79891 Long term (current) use of opiate analgesic: Secondary | ICD-10-CM | POA: Diagnosis not present

## 2017-03-20 DIAGNOSIS — Z9012 Acquired absence of left breast and nipple: Secondary | ICD-10-CM | POA: Insufficient documentation

## 2017-03-20 DIAGNOSIS — Z79899 Other long term (current) drug therapy: Secondary | ICD-10-CM | POA: Insufficient documentation

## 2017-03-20 DIAGNOSIS — Z9889 Other specified postprocedural states: Secondary | ICD-10-CM | POA: Diagnosis not present

## 2017-03-20 DIAGNOSIS — Z801 Family history of malignant neoplasm of trachea, bronchus and lung: Secondary | ICD-10-CM | POA: Diagnosis not present

## 2017-03-20 DIAGNOSIS — Z90710 Acquired absence of both cervix and uterus: Secondary | ICD-10-CM | POA: Insufficient documentation

## 2017-03-20 DIAGNOSIS — Z853 Personal history of malignant neoplasm of breast: Secondary | ICD-10-CM | POA: Insufficient documentation

## 2017-03-20 DIAGNOSIS — C50412 Malignant neoplasm of upper-outer quadrant of left female breast: Secondary | ICD-10-CM

## 2017-03-20 LAB — RESEARCH LABS

## 2017-03-20 MED ORDER — ZOLPIDEM TARTRATE 5 MG PO TABS: 5.0000 mg | ORAL_TABLET | Freq: Every evening | ORAL | 0 refills | Status: DC | PRN

## 2017-03-20 MED FILL — ZOLPIDEM TARTRATE 5 MG TAB: 5 | 30 days supply | Qty: 30 | Fill #0

## 2017-03-20 NOTE — Progress Notes (Signed)
Radiation Oncology         [phone] ________________________________   Outpatient Re-Consultation  Name: Alison [name] MRN: [id number]  Date: 03/20/2017  DOB: 1961/06/05  YD:XAJOINO, Fransico Him, MD  Magrinat, Virgie Dad, MD   REFERRING PHYSICIAN: Magrinat, Virgie Dad, MD  DIAGNOSIS:    ICD-10-CM   1. 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  HISTORY OF PRESENT ILLNESS::Alison [name] is a 56 y.o. 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 Patient had a PET on 02/22/2017 that revealed a widespread bony metastatic disease hat is identified to be affecting the proximal femurs, the right scapula, multiple ribs, the base of skull on the left, the cervical/thoracic/lumbar spine, the iliac bones,the remainder of the pelvic bones, and at least the proximal right femur. I've reviewed her images.  A focus over the left upper arm is incompletely evaluated but appears to be in the soft tissues, nonspecific.  Patient saw Dr. [name] on 02/28/17, who after reviewing the systemic treatment for her cancer, suggested local therapy with radiation to the areas most likely to be causing her the most pain may also allow him drop the narcotic dose. Patient last saw Dr. [name] on 03/19/2017 with her first Xgeva dose and her next fulvestrant dose. Patient  has contracted with [facility] [place] for chart review and long-distance second opinion.  She is going to have her expander removed and an implant placed 04/04/2017. She rates her pain a 4/10 to her lower back to bilateral areas and reports heat will help the pain to this area. Based on the patients description, pain is in the lumbar region bilaterally and can extant into upper saccum.  No other significant sites of pain.  PREVIOUS RADIATION THERAPY: yes to primary sites  PAST MEDICAL HISTORY:   has a past medical history of Asthma; Breast cancer ([place]); Complication of anesthesia (2007); History of blood transfusion; History of mitral valve prolapse (1988); History of radiation therapy (04/02/16- 05/21/16); Hypertension; Neuropathy; Osteopenia determined by x-ray; Pneumonia; Raynaud's disease; Sigmoidovaginal fistula; and Skin cancer (03/12/2014).    PAST SURGICAL HISTORY: Past Surgical History:  Procedure Laterality Date  . ABDOMINAL HYSTERECTOMY  2007   with BSO  . APPENDECTOMY  2007  . AXILLARY LYMPH NODE DISSECTION Left 02/14/2016   Procedure: LEFT AXILLARY LYMPH NODE DISSECTION;  Surgeon: [name], MD;  Location: [facility];  Service: General;  Laterality: Left;  . BREAST RECONSTRUCTION WITH PLACEMENT OF TISSUE EXPANDER AND FLEX HD (ACELLULAR HYDRATED DERMIS) Left 02/02/2016   Procedure: IMMEDIATE LEFT BREAST RECONSTRUCTION WITH PLACEMENT OF TISSUE EXPANDER AND FLEX HD (ACELLULAR HYDRATED DERMIS);  Surgeon: [name], DO;  Location: [facility];  Service: Plastics;  Laterality: Left;  . BREAST SURGERY Left 02/02/16   mastectomy with reconstruction  . c-section  1991  . COLON RESECTION SIGMOID  12/02/2012   [facility]   . HERNIA REPAIR  2007  . LAPAROSCOPIC ENDOMETRIOSIS FULGURATION  1984, 1989  . Port- a- cath placement  07/2015  . PORT-A-CATH REMOVAL Right 02/02/2016   Procedure: REMOVAL PORT-A-CATH;  Surgeon: [name], MD;  Location: [facility];  Service: General;  Laterality: Right;  . RADIOACTIVE SEED GUIDED AXILLARY SENTINEL LYMPH NODE Left 02/02/2016   Procedure: LEFT BREAST MASTECTOMY AND RADIOACTIVE SEED GUIDED LEFT SENTINEL LYMPH NODE BIOPSY ;  Surgeon: [name],  MD;  Location: [facility];  Service: General;  Laterality: Left;    FAMILY HISTORY: family history includes Lung cancer in her father.  SOCIAL HISTORY:  reports that she quit smoking about 8 years ago. Her smoking use included Cigarettes. She has a 10.00 pack-year  smoking history. She has never used smokeless tobacco. She reports that she does not drink alcohol or use drugs.  ALLERGIES: Sulfa antibiotics and Zofran [ondansetron hcl]  MEDICATIONS:  Current Outpatient Prescriptions  Medication Sig Dispense Refill  . Abemaciclib 100 MG TABS Take 100 mg by mouth 2 (two) times daily. 60 tablet 3  . anastrozole (ARIMIDEX) 1 MG tablet Take 1 mg by mouth daily.    . cetirizine (ZYRTEC) 10 MG tablet Take 10 mg by mouth daily.    . Cholecalciferol (VITAMIN D3) 2000 UNITS capsule Take 2,000 Units by mouth daily.     Marland Kitchen escitalopram (LEXAPRO) 10 MG tablet Take 1 tablet (10 mg total) by mouth daily. 30 tablet 12  . fluticasone (FLONASE) 50 MCG/ACT nasal spray Place 2 sprays into both nostrils daily. Reported on 11/18/2015    . gabapentin (NEURONTIN) 300 MG capsule Take 1 capsule (300 mg total) by mouth at bedtime as needed. Take 2 tablets in AM, 2 tablets mid-day, and 4 tablets at bedtime 90 capsule 4  . lidocaine (LIDODERM) 5 % APPLY ONE PATCH DAILY -REMOVE AND DISCARD WITHIN 12 HOURS OR AS DIRECTED BY MD 30 patch 0  . LORazepam (ATIVAN) 0.5 MG tablet Take 1 tablet (0.5 mg total) by mouth at bedtime. 30 tablet 3  . methocarbamol (ROBAXIN) 500 MG tablet Take by mouth.    . Multiple Vitamin (MULTIVITAMIN) tablet Take 1 tablet by mouth daily.    Marland Kitchen oxyCODONE (OXYCONTIN) 40 mg 12 hr tablet Take 1 tablet (40 mg total) by mouth 3 (three) times daily. 90 tablet 0  . oxyCODONE (ROXICODONE) 15 MG immediate release tablet Take 1 tablet (15 mg total) by mouth every 8 (eight) hours as needed for pain. 120 tablet 0  . Probiotic Product (PROBIOTIC DAILY PO) Take 1 capsule by mouth daily. [place] prochlorperazine (COMPAZINE) 10 MG tablet Take 1 tablet (10 mg total) by mouth every 6 (six) hours as needed (Nausea or vomiting). 30 tablet 2  . traZODone (DESYREL) 50 MG tablet Take 3 tablets (150 mg total) by mouth at bedtime. 90 tablet 0  .  zolpidem (AMBIEN) 5 MG tablet Take 1 tablet (5 mg total) by mouth at bedtime as needed for sleep. 30 tablet 0   No current facility-administered medications for this encounter.     REVIEW OF SYSTEMS:  As above  PHYSICAL EXAM:  weight is 156 lb 6.4 oz (70.9 kg). Her temperature is 98.2 F (36.8 C). Her blood pressure is 128/57 (abnormal) and her pulse is 108 (abnormal). Her oxygen saturation is 96%.    General: Alert and oriented, in no acute distress. No reducable pain palpation HEENT: Head is normocephalic. Extraocular movements are intact. Oropharynx is clear. Neck: Neck is supple, no palpable cervical masses. Patient has lymphedema in this area. Heart: Regular in rate and rhythm with no murmurs, rubs, or gallops. Chest: Clear to auscultation bilaterally, with no rhonchi, wheezes, or rales. Left chest expander, no palpable mass in the left axilla and chest. No concerning changes. Abdomen: Soft, nontender, nondistended, with no rigidity or guarding. Extremities: No cyanosis or edema. Faint swelling in the left arm  Lymphatics: see  Neck Exam Skin: No concerning lesions. Musculoskeletal: no TTP in spine Neurologic: Cranial nerves II through XII are grossly intact. No obvious focalities. Speech is fluent. Coordination is intact. Psychiatric: Judgment and insight are intact. Affect is appropriate.  ECOG = 1  0 - Asymptomatic (Fully active, able to carry on all predisease activities without restriction)  1 - Symptomatic but completely ambulatory (Restricted in physically strenuous activity but ambulatory and able to carry out work of a light or sedentary nature. For example, light housework, office work)  2 - Symptomatic, <50% in bed during the day (Ambulatory and capable of all self care but unable to carry out any work activities. Up and about more than 50% of waking hours)  3 - Symptomatic, >50% in bed, but not bedbound (Capable of only limited self-care, confined to bed or chair 50% or  more of waking hours)  4 - Bedbound (Completely disabled. Cannot carry on any self-care. Totally confined to bed or chair)  5 - Death   Eustace Pen MM, Creech RH, Tormey DC, et al. [phone]). "Toxicity and response criteria of the [facility] Group". [place] Oncol. 5 (6): 649-55   LABORATORY DATA:  Lab Results  Component Value Date   WBC 2.6 (L) 03/19/2017   HGB 10.3 (L) 03/19/2017   HCT 30.5 (L) 03/19/2017   MCV 98.2 03/19/2017   PLT 240 03/19/2017   CMP     Component Value Date/Time   NA 138 03/19/2017 1420   K 4.4 03/19/2017 1420   CL 110 02/15/2016 0330   CO2 23 03/19/2017 1420   GLUCOSE 111 03/19/2017 1420   BUN 8.6 03/19/2017 1420   CREATININE 0.9 03/19/2017 1420   CALCIUM 9.3 03/19/2017 1420   PROT 6.9 03/19/2017 1420   ALBUMIN 3.5 03/19/2017 1420   AST 20 03/19/2017 1420   ALT 18 03/19/2017 1420   ALKPHOS 109 03/19/2017 1420   BILITOT 0.47 03/19/2017 1420   GFRNONAA >60 02/15/2016 0330   GFRAA >60 02/15/2016 0330      RADIOGRAPHY: Ct Cervical Spine W Contrast  Result Date: 02/27/2017 CLINICAL DATA:  Metastatic breast cancer. Widespread bony metastatic disease demonstrated by PET scan. EXAM: CT CERVICAL SPINE WITH CONTRAST CT THORACIC SPINE WITH CONTRAST TECHNIQUE: Multidetector CT imaging of the cervical and thoracic spine was performed with intravenous contrast. Multiplanar CT image reconstructions were also generated. CONTRAST:  140mL ISOVUE-300 IOPAMIDOL (ISOVUE-300) INJECTION 61% COMPARISON:  PET scan 02/22/2017 FINDINGS: CT CERVICAL SPINE FINDINGS Alignment: Straightening and kyphotic curvature in the cervical region. 2 mm anterolisthesis C5-6. Skull base and vertebrae: Few small sclerotic foci, most notable centrally at the C2 level. No lytic C2 lesion. C3:  Lytic lesion at the left vertebral body pedicle junction. C4:  No metastasis identified. C5: Extensive primarily lytic tumor within the vertebral body. No extraosseous tumor identified. C6:   Negative C7:  Negative Soft tissues and spinal canal: No soft tissue neck lesion seen. Disc levels: Degenerative facet arthropathy on the right at C2-3, C3-4 and C4-5. Degenerative facet arthropathy on the left at C4-5 and C5-6. Spondylosis with small uncovertebral osteophytes at C5-6 and C6-7. No significant stenosis. CT THORACIC SPINE FINDINGS Alignment: Minimal spinal curvature. Vertebrae: T1: Negative. T2: Extensive lytic disease on the left. Question small amount of ventral epidural tumor. T3:  No lesions seen. T4: Some sclerotic change on the right. No lytic change or extraosseous tumor. T5: Sclerotic focus in the left posterior inferior corner. No extraosseous tumor. T6: Lytic lesion in the anterior vertebral  body. No extraosseous tumor. T7: Sclerotic focus on the right side of the vertebral body. No extraosseous tumor. T8: Sclerotic focus inferior vertebral body. No extraosseous tumor. T9: Few small sclerotic foci.  No extraosseous tumor. T10: Probable metastatic disease inferior vertebral body with insufficiency fracture of the inferior endplate. No extraosseous tumor. T11: Extensive lytic tumor on the left with insufficiency fracture of the superior endplate. No canal compromise identified. Cannot rule laterally epidural tumor. T12: Extensive lytic and sclerotic metastatic disease within the vertebral body. Cannot rule laterally epidural tumor. Lytic sclerotic metastatic disease within L1 and L2 as described on the previous lumbar study. Paraspinal and other soft tissues: No paraspinal mass measurable. Disc levels: No significant degenerative disease. IMPRESSION: Cervical spine: Lytic disease at C3 and C5 but without fracture or extraosseous tumor at this time. Thoracic spine: Extensive metastatic disease with most prominent lytic findings at T2, T6, T10, T11 and T12. Inferior endplate fracture at X54 and superior endplate fracture at M08 probably due to underlying metastatic disease. No convincing canal  compromise by tumor on this CT study. Early ventral epidural tumor not excluded at T11 and T12 and also at T2. Electronically Signed   By: Nelson Chimes M.D.   On: 02/27/2017 07:34   Ct Thoracic Spine W Contrast  Result Date: 02/27/2017 CLINICAL DATA:  Metastatic breast cancer. Widespread bony metastatic disease demonstrated by PET scan. EXAM: CT CERVICAL SPINE WITH CONTRAST CT THORACIC SPINE WITH CONTRAST TECHNIQUE: Multidetector CT imaging of the cervical and thoracic spine was performed with intravenous contrast. Multiplanar CT image reconstructions were also generated. CONTRAST:  129mL ISOVUE-300 IOPAMIDOL (ISOVUE-300) INJECTION 61% COMPARISON:  PET scan 02/22/2017 FINDINGS: CT CERVICAL SPINE FINDINGS Alignment: Straightening and kyphotic curvature in the cervical region. 2 mm anterolisthesis C5-6. Skull base and vertebrae: Few small sclerotic foci, most notable centrally at the C2 level. No lytic C2 lesion. C3:  Lytic lesion at the left vertebral body pedicle junction. C4:  No metastasis identified. C5: Extensive primarily lytic tumor within the vertebral body. No extraosseous tumor identified. C6:  Negative C7:  Negative Soft tissues and spinal canal: No soft tissue neck lesion seen. Disc levels: Degenerative facet arthropathy on the right at C2-3, C3-4 and C4-5. Degenerative facet arthropathy on the left at C4-5 and C5-6. Spondylosis with small uncovertebral osteophytes at C5-6 and C6-7. No significant stenosis. CT THORACIC SPINE FINDINGS Alignment: Minimal spinal curvature. Vertebrae: T1: Negative. T2: Extensive lytic disease on the left. Question small amount of ventral epidural tumor. T3:  No lesions seen. T4: Some sclerotic change on the right. No lytic change or extraosseous tumor. T5: Sclerotic focus in the left posterior inferior corner. No extraosseous tumor. T6: Lytic lesion in the anterior vertebral body. No extraosseous tumor. T7: Sclerotic focus on the right side of the vertebral body. No  extraosseous tumor. T8: Sclerotic focus inferior vertebral body. No extraosseous tumor. T9: Few small sclerotic foci.  No extraosseous tumor. T10: Probable metastatic disease inferior vertebral body with insufficiency fracture of the inferior endplate. No extraosseous tumor. T11: Extensive lytic tumor on the left with insufficiency fracture of the superior endplate. No canal compromise identified. Cannot rule laterally epidural tumor. T12: Extensive lytic and sclerotic metastatic disease within the vertebral body. Cannot rule laterally epidural tumor. Lytic sclerotic metastatic disease within L1 and L2 as described on the previous lumbar study. Paraspinal and other soft tissues: No paraspinal mass measurable. Disc levels: No significant degenerative disease. IMPRESSION: Cervical spine: Lytic disease at C3 and C5 but without fracture or  extraosseous tumor at this time. Thoracic spine: Extensive metastatic disease with most prominent lytic findings at T2, T6, T10, T11 and T12. Inferior endplate fracture at G95 and superior endplate fracture at A21 probably due to underlying metastatic disease. No convincing canal compromise by tumor on this CT study. Early ventral epidural tumor not excluded at T11 and T12 and also at T2. Electronically Signed   By: Nelson Chimes M.D.   On: 02/27/2017 07:34   Nm Pet Image Restag (ps) Skull Base To Thigh  Result Date: 02/22/2017 CLINICAL DATA:  History of breast cancer. Apparent metastatic disease on a recent lumbar spine CT. EXAM: NUCLEAR MEDICINE PET SKULL BASE TO THIGH TECHNIQUE: 7.62 mCi F-18 FDG was injected intravenously. Full-ring PET imaging was performed from the skull base to thigh after the radiotracer. CT data was obtained and used for attenuation correction and anatomic localization. FASTING BLOOD GLUCOSE:  Value: 102 mg/dl COMPARISON:  CT of the chest April 02, 2016. Bone scans April 02, 2016 and February 02, 2016. FINDINGS: NECK No hypermetabolic lymph nodes in the neck.  CHEST There is opacity in the left upper lobe extending from the suprahilar region to the pleural surface. This has the appearance of radiation change and atelectasis rather than malignancy. There is associated volume loss. This opacity does demonstrate mild low level FDG uptake with a maximum SUV on image 65 of 2.1. No other evidence of soft tissue metastases in the chest. Coronary artery calcifications are again identified. The patient is status post left mastectomy with reconstruction. ABDOMEN/PELVIS No FDG avid disease in the abdomen or pelvis. Mild atherosclerotic change in the non aneurysmal aorta. Fecal loading in the cecum, ascending colon, transverse colon, and part of the descending colon. SKELETON Widespread bony metastatic disease is identified affecting the proximal femurs, the right scapula, multiple ribs, the base of skull on the left, the cervical/thoracic/lumbar spine, the iliac bones, the remainder of the pelvic bones, and at least the proximal right femur. A focus over the left upper arm is incompletely evaluated but appears to be in the soft tissues, nonspecific. IMPRESSION: 1. Widespread bony metastatic disease as above. 2. Linear opacity in the left upper lobe extending from the suprahilar region to the pleura with low level uptake is thought to be postradiation change and/or atelectasis. 3. No other FDG avid abnormalities identified. Electronically Signed   By: Dorise Bullion III M.D   On: 02/22/2017 14:47      IMPRESSION/PLAN: Today, I talked to the patient about the findings and work-up thus far. Unfortunately she has widespread bony metastases.  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 Side effects may include but not necessarily be limited to: fatigue, GI upset, and rare internal organ injuries. No guarantees of treatment were given. A consent form was signed and placed in the patient's medical record.  The patient was encouraged to ask questions that I answered to the best of my ability.  I plan to treat the lumbo-sacral sites that are causing her pain to 35Gy in 14 fractions. Simulation to occur soon. She is enthusiastic with this plan.  I spent 25 minutes  face to face with the patient and more than 50% of that time was spent in counseling and/or coordination of  care.     __________________________________________   Eppie Gibson, MD  This document serves as a record of services personally performed by Eppie Gibson, MD. It was created on her behalf by Valeta Harms, a trained medical scribe. The creation of this record is based on the scribe's personal observations and the provider's statements to them. This document has been checked and approved by the attending provider.

## 2017-03-21 ENCOUNTER — Ambulatory Visit: Payer: 59 | Attending: General Surgery | Admitting: Physical Therapy

## 2017-03-21 DIAGNOSIS — M5440 Lumbago with sciatica, unspecified side: Secondary | ICD-10-CM | POA: Diagnosis present

## 2017-03-21 DIAGNOSIS — G8929 Other chronic pain: Secondary | ICD-10-CM | POA: Diagnosis present

## 2017-03-21 DIAGNOSIS — R293 Abnormal posture: Secondary | ICD-10-CM | POA: Diagnosis not present

## 2017-03-21 DIAGNOSIS — M25612 Stiffness of left shoulder, not elsewhere classified: Secondary | ICD-10-CM

## 2017-03-21 DIAGNOSIS — I890 Lymphedema, not elsewhere classified: Secondary | ICD-10-CM | POA: Diagnosis present

## 2017-03-21 DIAGNOSIS — M25512 Pain in left shoulder: Secondary | ICD-10-CM | POA: Diagnosis present

## 2017-03-21 LAB — HEMOGLOBIN A1C
ESTIMATED AVERAGE GLUCOSE: 105 mg/dL
HEMOGLOBIN A1C: 5.3 % (ref 4.8–5.6)

## 2017-03-21 NOTE — Therapy (Signed)
[place], [place], [address] Phone: [phone]   Fax:  [phone]  Physical Therapy Treatment  Patient Details  Name: Alison [name] MRN: [id number] Date of Birth: 15-Aug-1961 Referring Provider: Dr. Barry Dienes   Encounter Date: 03/21/2017      PT End of Session - 03/21/17 1735    Visit Number 28   Number of Visits 39   Date for PT Re-Evaluation 04/26/17   PT Start Time 1300   PT Stop Time 1345   PT Time Calculation (min) 45 min   Activity Tolerance Patient tolerated treatment well   Behavior During Therapy [facility] for tasks assessed/performed      Past Medical History:  Diagnosis Date  . Asthma     seasonal asthma  . Breast cancer ([facility])   . Complication of anesthesia 2007   aspiration pneumonia post hysterectomy.  Blood pressure would drop low- but no problems 01/2016- surgery  . History of blood transfusion   . History of mitral valve prolapse 1988   with palpitations  . History of radiation therapy 04/02/16- 05/21/16   Left Chest Wall, SCV, Axilla, IM nodes  . Hypertension   . Neuropathy    with chemo  . Osteopenia determined by x-ray   . Pneumonia    1980's and 1990's  . Raynaud's disease   . 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 Skin cancer 03/12/2014   basal- back    Past Surgical History:  Procedure Laterality Date  . ABDOMINAL HYSTERECTOMY  2007   with BSO  . APPENDECTOMY  2007  . AXILLARY LYMPH NODE DISSECTION Left 02/14/2016   Procedure: LEFT AXILLARY LYMPH NODE DISSECTION;  Surgeon: [name], MD;  Location: [place];  Service: General;  Laterality: Left;  . BREAST RECONSTRUCTION WITH PLACEMENT OF TISSUE EXPANDER AND FLEX HD (ACELLULAR HYDRATED DERMIS) Left 02/02/2016   Procedure: IMMEDIATE LEFT BREAST RECONSTRUCTION WITH PLACEMENT OF TISSUE EXPANDER AND FLEX HD (ACELLULAR HYDRATED DERMIS);  Surgeon: [name], DO;  Location: [place];  Service: Plastics;  Laterality: Left;  . BREAST SURGERY Left 02/02/16   mastectomy with reconstruction  . c-section  1991  . COLON RESECTION SIGMOID  12/02/2012   [facility]   . HERNIA REPAIR  2007  . LAPAROSCOPIC ENDOMETRIOSIS FULGURATION  1984, 1989  . Port- a- cath placement  07/2015  . PORT-A-CATH REMOVAL Right 02/02/2016   Procedure: REMOVAL PORT-A-CATH;  Surgeon: [name], MD;  Location: [place];  Service: General;  Laterality: Right;  . RADIOACTIVE SEED GUIDED AXILLARY SENTINEL LYMPH NODE Left 02/02/2016   Procedure: LEFT BREAST MASTECTOMY AND RADIOACTIVE SEED GUIDED LEFT SENTINEL LYMPH NODE BIOPSY ;  Surgeon: [name], MD;  Location: [place];  Service: General;  Laterality: Left;    There were no vitals filed for this visit.      Subjective Assessment - 03/21/17 1339    Subjective Pt reports she has cancer all up and down her spine.  She is preparing for palliative radiation treatment.  She hopes to get her expander out soon  so that she can have a MRI for more testing.    Pertinent History Breast cancer diagnosed November2016 She had chemotherapy. but did have some neuropathy so it had to be stopped. On June 8, she had a mastectomy with immedicate expander placement and with first set of lymphnodes removed, On June 20 she went for complete axillary lymph node  dissection. She says she has had always been "full necked" but has neck and upper body fullness since the cancer was diagnosed. She reports she has osteopenia in low back and has had a fracture that was discovered in 2014 when she had a Dexa scan.   Patient Stated Goals to decrease the pain    Currently in Pain? Yes   Pain Score 7    Pain Location Back   Pain Orientation Posterior   Pain Descriptors / Indicators Sharp   Pain Type Acute pain   Pain Radiating Towards with movement    Pain Onset More than a month ago   Pain Frequency Constant   Pain Location --                          [facility] Adult PT Treatment/Exercise - 03/21/17 0001      Lumbar Exercises: Supine   Other Supine Lumbar Exercises Meeks decompression      Manual Therapy   Manual Lymphatic Drainage (MLD) in supine short neck, superficial and deep abdominals, right axillary nodes, anterior interaxillary anastamosis, anterior and posterior neck and Lt inguinal nodes and Lt axillo-inguinal anastomosis briefly, then to right sidelying for posterior interaxillary anastamosis.    Kinesiotex Edema     Kinesiotix   Edema skinkote and kinsesiotap in fan shape along left lateral trunk and axilla  and another piece from midback and neck toward lower back                         Long Term Clinic Goals - 02/21/17 1658      CC Long Term Goal  #1   Title Patient will report a decrease in left shoulder pain by 75% so she can perform daily activities with greater ease   Baseline Pain had gotten better, but has increased pain and swelling    Time 8   Status On-going     CC Long Term Goal  #2   Title Pt will report she knows strategies for good body mechanics to decrease low back pain    Time 8   Period Weeks   Status New     CC Long Term Goal  #3   Title Pt will be independent in core and LE strengthening exercises    Time 8   Period Weeks   Status New     CC Long Term Goal  #4   Title Pt will report low back pain is decreased to a 4/10 so that she can perform activities of daily living easier    Time 8   Period Weeks   Status New            Plan - 03/21/17 1735    Clinical Impression Statement Pt comes in with visible fullness and pain with mobility. She is sending her records to [facility] for another opinion. She wants to continue treatment as she received some relief from pain but is no longer seeing Dr. Barry Dienes,  A note was sent to Dr [name] to see if he will order PT to resume care    Rehab Potential Fair   Clinical Impairments Affecting  Rehab Potential chemotherapy with neuropathy    PT Next Visit Plan check for order from Dr. [name],  If received, instruct in body mechanics and beginning core activation exercises within pain limits.  Use TENS as adjunct for pain management and if it is helpful, arrange for  pt to have one at home manual lymph drainage and manual techniques to manage pain update HEP  with progression as able     Consulted and Agree with Plan of Care Patient      Patient will benefit from skilled therapeutic intervention in order to improve the following deficits and impairments:  Decreased range of motion, Impaired UE functional use, Decreased activity tolerance, Decreased knowledge of precautions, Decreased skin integrity, Impaired perceived functional ability, Pain, Decreased knowledge of use of DME, Increased edema, Postural dysfunction, Decreased strength, Impaired sensation, Decreased scar mobility, Decreased mobility, Increased muscle spasms  Visit Diagnosis: Abnormal posture  Lymphedema, not elsewhere classified  Stiffness of left shoulder joint  Chronic left shoulder pain  Low back pain with sciatica, sciatica laterality unspecified, unspecified back pain laterality, unspecified chronicity     Problem List Patient Active Problem List   Diagnosis Date Noted  . Bone metastases ([place]) 02/28/2017  . Uncontrolled pain 02/28/2017  . Osteopenia determined by x-ray 09/20/2016  . Primary malignant neoplasm of left breast with stage 2 nodal metastasis per American Joint Committee on Cancer 7th edition (N2) ([place]) 02/14/2016  . Chemotherapy-induced neuropathy ([place]) 12/30/2015  . Insomnia 09/01/2015  . Malignant neoplasm of upper-outer quadrant of left breast in female, estrogen receptor positive ([place]) 08/08/2015   Donato Heinz. Owens Shark PT  Norwood Levo 03/21/2017, 5:38 PM  [place], [place], [address] Phone:  [phone]   Fax:  [phone]  Name: Alison [name] MRN: [id number] Date of Birth: [birth year]

## 2017-03-21 NOTE — Patient Instructions (Signed)

## 2017-03-22 ENCOUNTER — Telehealth: Payer: Self-pay | Admitting: Oncology

## 2017-03-22 NOTE — Telephone Encounter (Signed)
Faxed records to Dr Mikey College [phone]

## 2017-03-25 ENCOUNTER — Encounter (HOSPITAL_BASED_OUTPATIENT_CLINIC_OR_DEPARTMENT_OTHER): Payer: Self-pay | Admitting: *Deleted

## 2017-03-26 ENCOUNTER — Ambulatory Visit
Admission: RE | Admit: 2017-03-26 | Discharge: 2017-03-26 | Disposition: A | Discharge status: Home / Self Care | Payer: 59 | Source: Ambulatory Visit | Attending: Radiation Oncology | Admitting: Radiation Oncology

## 2017-03-26 ENCOUNTER — Ambulatory Visit (HOSPITAL_BASED_OUTPATIENT_CLINIC_OR_DEPARTMENT_OTHER): Payer: 59

## 2017-03-26 ENCOUNTER — Ambulatory Visit: Payer: 59 | Admitting: Physical Therapy

## 2017-03-26 VITALS — BP 157/72 | HR 72 | Temp 98.5°F | Resp 18

## 2017-03-26 DIAGNOSIS — M25512 Pain in left shoulder: Secondary | ICD-10-CM

## 2017-03-26 DIAGNOSIS — R293 Abnormal posture: Secondary | ICD-10-CM

## 2017-03-26 DIAGNOSIS — Z5111 Encounter for antineoplastic chemotherapy: Secondary | ICD-10-CM

## 2017-03-26 DIAGNOSIS — M5440 Lumbago with sciatica, unspecified side: Secondary | ICD-10-CM

## 2017-03-26 DIAGNOSIS — C50412 Malignant neoplasm of upper-outer quadrant of left female breast: Secondary | ICD-10-CM

## 2017-03-26 DIAGNOSIS — C779 Secondary and unspecified malignant neoplasm of lymph node, unspecified: Secondary | ICD-10-CM

## 2017-03-26 DIAGNOSIS — C7951 Secondary malignant neoplasm of bone: Secondary | ICD-10-CM | POA: Diagnosis not present

## 2017-03-26 DIAGNOSIS — G8929 Other chronic pain: Secondary | ICD-10-CM

## 2017-03-26 DIAGNOSIS — M25612 Stiffness of left shoulder, not elsewhere classified: Secondary | ICD-10-CM

## 2017-03-26 DIAGNOSIS — C50912 Malignant neoplasm of unspecified site of left female breast: Secondary | ICD-10-CM

## 2017-03-26 DIAGNOSIS — I890 Lymphedema, not elsewhere classified: Secondary | ICD-10-CM

## 2017-03-26 DIAGNOSIS — Z510 Encounter for antineoplastic radiation therapy: Secondary | ICD-10-CM | POA: Diagnosis not present

## 2017-03-26 DIAGNOSIS — M8580 Other specified disorders of bone density and structure, unspecified site: Secondary | ICD-10-CM

## 2017-03-26 MED ORDER — DENOSUMAB 120 MG/1.7ML ~~LOC~~ SOLN
120.0000 mg | Freq: Once | SUBCUTANEOUS | Status: AC
  Administered 2017-03-26: 120 mg via SUBCUTANEOUS
  Filled 2017-03-26: qty 1.7

## 2017-03-26 MED ORDER — FULVESTRANT 250 MG/5ML IM SOLN
500.0000 mg | Freq: Once | INTRAMUSCULAR | Status: AC
  Administered 2017-03-26: 500 mg via INTRAMUSCULAR
  Filled 2017-03-26: qty 10

## 2017-03-26 NOTE — Therapy (Signed)
[place], [place], [address] Phone: [phone]   Fax:  [phone]  Physical Therapy Treatment  Patient Details  Name: Alison [name] MRN: [id number] Date of Birth: September 11, 1960 Referring Provider: Dr. Barry Dienes   Encounter Date: 03/26/2017      PT End of Session - 03/26/17 1533    Visit Number 29   Number of Visits 39   Date for PT Re-Evaluation 04/26/17   PT Start Time 1300   PT Stop Time 1343   PT Time Calculation (min) 43 min   Activity Tolerance Patient tolerated treatment well   Behavior During Therapy [facility] for tasks assessed/performed      Past Medical History:  Diagnosis Date  . Anxiety   . Asthma     seasonal asthma  . Breast cancer ([place])    metastatic breast cancer  . Complication of anesthesia 2007   aspiration pneumonia post hysterectomy.  Blood pressure would drop low- but no problems 01/2016- surgery  . History of blood transfusion   . History of mitral valve prolapse 1988   with palpitations  . History of radiation therapy 04/02/16- 05/21/16   Left Chest Wall, SCV, Axilla, IM nodes  . Hypertension    no meds  . Neuropathy    with chemo  . Osteopenia determined by x-ray   . Pneumonia    1980's and 1990's  . Raynaud's disease   . 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 Skin cancer 03/12/2014   basal- back    Past Surgical History:  Procedure Laterality Date  . ABDOMINAL HYSTERECTOMY  2007   with BSO  . APPENDECTOMY  2007  . AXILLARY LYMPH NODE DISSECTION Left 02/14/2016   Procedure: LEFT AXILLARY LYMPH NODE DISSECTION;  Surgeon: [name], MD;  Location: [place];  Service: General;  Laterality: Left;  . BREAST RECONSTRUCTION WITH PLACEMENT OF TISSUE EXPANDER AND FLEX HD (ACELLULAR HYDRATED DERMIS) Left 02/02/2016   Procedure: IMMEDIATE LEFT BREAST RECONSTRUCTION WITH PLACEMENT OF TISSUE EXPANDER AND FLEX HD (ACELLULAR HYDRATED DERMIS);   Surgeon: [name], DO;  Location: [place];  Service: Plastics;  Laterality: Left;  . BREAST SURGERY Left 02/02/16   mastectomy with reconstruction  . c-section  1991  . COLON RESECTION SIGMOID  12/02/2012   [facility]   . HERNIA REPAIR  2007  . LAPAROSCOPIC ENDOMETRIOSIS FULGURATION  1984, 1989  . Port- a- cath placement  07/2015  . PORT-A-CATH REMOVAL Right 02/02/2016   Procedure: REMOVAL PORT-A-CATH;  Surgeon: [name], MD;  Location: [place];  Service: General;  Laterality: Right;  . RADIOACTIVE SEED GUIDED AXILLARY SENTINEL LYMPH NODE Left 02/02/2016   Procedure: LEFT BREAST MASTECTOMY AND RADIOACTIVE SEED GUIDED LEFT SENTINEL LYMPH NODE BIOPSY ;  Surgeon: [name], MD;  Location: [place];  Service: General;  Laterality: Left;    There were no vitals filed for this visit.      Subjective Assessment - 03/26/17 1443    Subjective Eloyce reports she will have surgery on Thursday for removal of expander on left chest with placement of small implant and reduction of right breast.  She is going to injections today and will have a simulation for radiation which she hopes will begin next week                          [facility] Adult PT Treatment/Exercise - 03/26/17  0001      Lumbar Exercises: Standing   Heel Raises 5 reps   Functional Squats 5 reps   Functional Squats Limitations very small range "wall tap" keeping knee behind toes    Forward Lunge 5 reps   Forward Lunge Limitations very small range keeping knee behind toes    Other Standing Lumbar Exercises bilateral standing low rows with red theraband only to hip and only within pain free range.    Other Standing Lumbar Exercises standing with abds engaged, bilateral arm raise in mid range.      Shoulder Exercises: Standing   Other Standing Exercises b     Manual Therapy   Manual Lymphatic Drainage (MLD) in supine short neck, superficial and deep  abdominals, right axillary nodes, anterior interaxillary anastamosis, anterior and posterior neck and Lt inguinal nodes and Lt axillo-inguinal anastomosis briefly, then to right sidelying for posterior interaxillary anastamosis.                         [facility] Goals - 02/21/17 1658      CC Long Term Goal  #1   Title Patient will report a decrease in left shoulder pain by 75% so she can perform daily activities with greater ease   Baseline Pain had gotten better, but has increased pain and swelling    Time 8   Status On-going     CC Long Term Goal  #2   Title Pt will report she knows strategies for good body mechanics to decrease low back pain    Time 8   Period Weeks   Status New     CC Long Term Goal  #3   Title Pt will be independent in core and LE strengthening exercises    Time 8   Period Weeks   Status New     CC Long Term Goal  #4   Title Pt will report low back pain is decreased to a 4/10 so that she can perform activities of daily living easier    Time Grat[place] - 03/26/17 1533    Clinical Impression Statement Ettie is happy to be moving forward with removal of expander so that she can get radiation to help with back pain.  Began core stabalization exercise today with continued manual lymph draiange for pain control.  Magda Paganini asked me to contact Fransisco Beau, RN at [facility] at [phone] or [phone] ext [phone] to inform them about supraclavicular, neck , upper back and trunk lymphedema that has been resistent to treatment.  Will call today or tomorrow    Rehab Potential Fair   Clinical Impairments Affecting Rehab Potential chemotherapy with neuropathy    PT Frequency 2x / week   PT Duration 4 weeks   PT Treatment/Interventions ADLs/Self Care Home Management;Manual lymph drainage;Compression bandaging;Scar mobilization;Passive range of motion;Patient/family education;Taping;Manual  techniques;Therapeutic exercise;Therapeutic activities;Orthotic Fit/Training;DME Instruction;Electrical Stimulation;Moist Heat   PT Next Visit Plan check for order from Dr. [name],  If received, instruct in body mechanics and beginning core activation exercises within pain limits.  Use TENS as adjunct for pain management and if it is helpful, arrange for pt to have one at home manual lymph drainage and manual techniques to manage pain update HEP  with progression as able     Consulted and Agree with Plan of Care Patient  Patient will benefit from skilled therapeutic intervention in order to improve the following deficits and impairments:  Decreased range of motion, Impaired UE functional use, Decreased activity tolerance, Decreased knowledge of precautions, Decreased skin integrity, Impaired perceived functional ability, Pain, Decreased knowledge of use of DME, Increased edema, Postural dysfunction, Decreased strength, Impaired sensation, Decreased scar mobility, Decreased mobility, Increased muscle spasms  Visit Diagnosis: Abnormal posture  Lymphedema, not elsewhere classified  Stiffness of left shoulder joint  Chronic left shoulder pain  Low back pain with sciatica, sciatica laterality unspecified, unspecified back pain laterality, unspecified chronicity     Problem List Patient Active Problem List   Diagnosis Date Noted  . Bone metastases ([place]) 02/28/2017  . Uncontrolled pain 02/28/2017  . Osteopenia determined by x-ray 09/20/2016  . Primary malignant neoplasm of left breast with stage 2 nodal metastasis per American Joint Committee on Cancer 7th edition (N2) ([place]) 02/14/2016  . Chemotherapy-induced neuropathy ([place]) 12/30/2015  . Insomnia 09/01/2015  . Malignant neoplasm of upper-outer quadrant of left breast in female, estrogen receptor positive ([place]) 08/08/2015   Donato Heinz. Owens Shark PT  Norwood Levo 03/26/2017, 3:38 PM  [place], [place], [address] Phone: [phone]   Fax:  [phone]  Name: AVENELL SELLERS MRN: [id number] Date of Birth: 07-30-1961

## 2017-03-26 NOTE — Progress Notes (Signed)
   Radiation Oncology         [phone] ________________________________  Name: [name] MRN: [id number]  Date: 03/26/2017  DOB: [birth year]  SIMULATION AND TREATMENT PLANNING NOTE  Outpatient  DIAGNOSIS:     ICD-10-CM   1. Bone metastases ([place]) C79.51     NARRATIVE:  The patient was brought to the [facility].  Identity was confirmed.  All relevant records and images related to the planned course of therapy were reviewed.  The patient freely provided informed written consent to proceed with treatment after reviewing the details related to the planned course of therapy. The consent form was witnessed and verified by the simulation staff.    Then, the patient was set-up in a stable reproducible  supine position for radiation therapy.  CT images were obtained.  Surface markings were placed.  The CT images were loaded into the planning software.    TREATMENT PLANNING NOTE: Treatment planning then occurred.  The radiation prescription was entered and confirmed.    A total of 6 medically necessary complex treatment devices were fabricated and supervised by me, in the form of 5 fields with MLCs to block bowel and kidneys and a vaclok. MORE FIELDS WITH MLCs MAY BE ADDED IN DOSIMETRY for dose homogeneity.  I have requested : 3D Simulation  I have requested a DVH of the following structures: cord, bowel, kidneys, CTV.   The patient will receive 35 Gy in 14 fractions to the lumbosacral spine.   -----------------------------------  Eppie Gibson, MD

## 2017-03-27 NOTE — Anesthesia Preprocedure Evaluation (Addendum)
Anesthesia Evaluation  Patient identified by MRN, date of birth, ID band Patient awake    Reviewed: Allergy & Precautions, NPO status , Patient's Chart, lab work & pertinent test results  History of Anesthesia Complications Negative for: history of anesthetic complications  Airway Mallampati: III  TM Distance: <3 FB Neck ROM: Full  Mouth opening: Limited Mouth Opening  Dental no notable dental hx. (+) Teeth Intact, Dental Advisory Given, Implants,    Pulmonary asthma , former smoker,    Pulmonary exam normal breath sounds clear to auscultation       Cardiovascular hypertension, Pt. on medications + Peripheral Vascular Disease  Normal cardiovascular exam  LV EF: 55% -   60%   Neuro/Psych PSYCHIATRIC DISORDERS Anxiety  Neuromuscular disease    GI/Hepatic Neg liver ROS, GERD  Medicated,  Endo/Other  negative endocrine ROS  Renal/GU negative Renal ROS  negative genitourinary   Musculoskeletal negative musculoskeletal ROS (+)   Abdominal   Peds negative pediatric ROS (+)  Hematology negative hematology ROS (+)   Anesthesia Other Findings   Reproductive/Obstetrics negative OB ROS                          Lab Results  Component Value Date   WBC 2.6 (L) 03/19/2017   HGB 10.3 (L) 03/19/2017   HCT 30.5 (L) 03/19/2017   MCV 98.2 03/19/2017   PLT 240 03/19/2017   Lab Results  Component Value Date   CREATININE 0.9 03/19/2017   BUN 8.6 03/19/2017   NA 138 03/19/2017   K 4.4 03/19/2017   CL 110 02/15/2016   CO2 23 03/19/2017   Lab Results  Component Value Date   INR 1.03 08/15/2015   07/2015 - Left ventricle: The cavity size was normal. Wall thickness was increased in a pattern of mild LVH. Systolic function was normal. The estimated ejection fraction was in the range of 55% to 60%. Wall motion was normal; there were no regional wall motion abnormalities. Doppler parameters are  consistent with abnormal left ventricular relaxation (grade 1 diastolic dysfunction). - Aortic valve: There was trivial regurgitation. - Left atrium: The atrium was mildly dilated. - Atrial septum: There was increased thickness of the septum, consistent with lipomatous hypertrophy. - Pericardium, extracardiac: A trivial pericardial effusion was identified.     Anesthesia Physical  Anesthesia Plan  ASA: II  Anesthesia Plan: General   Post-op Pain Management:    Induction: Intravenous  PONV Risk Score and Plan: 3 and Ondansetron, Dexamethasone and Scopolamine patch - Pre-op  Airway Management Planned: LMA and Oral ETT  Additional Equipment:   Intra-op Plan:   Post-operative Plan: Extubation in OR  Informed Consent: I have reviewed the patients History and Physical, chart, labs and discussed the procedure including the risks, benefits and alternatives for the proposed anesthesia with the patient or authorized representative who has indicated his/her understanding and acceptance.   Dental advisory given  Plan Discussed with: CRNA, Anesthesiologist and Surgeon  Anesthesia Plan Comments:        Anesthesia Quick Evaluation

## 2017-03-28 ENCOUNTER — Encounter: Payer: 59 | Admitting: Physical Therapy

## 2017-03-28 ENCOUNTER — Ambulatory Visit (HOSPITAL_BASED_OUTPATIENT_CLINIC_OR_DEPARTMENT_OTHER): Payer: 59 | Admitting: Anesthesiology

## 2017-03-28 ENCOUNTER — Encounter (HOSPITAL_BASED_OUTPATIENT_CLINIC_OR_DEPARTMENT_OTHER): Payer: Self-pay | Admitting: *Deleted

## 2017-03-28 ENCOUNTER — Ambulatory Visit (HOSPITAL_BASED_OUTPATIENT_CLINIC_OR_DEPARTMENT_OTHER)
Admission: RE | Admit: 2017-03-28 | Discharge: 2017-03-28 | Disposition: A | Discharge status: Home / Self Care | Payer: 59 | Source: Ambulatory Visit | Attending: Plastic Surgery | Admitting: Plastic Surgery

## 2017-03-28 ENCOUNTER — Other Ambulatory Visit (HOSPITAL_COMMUNITY): Payer: 59

## 2017-03-28 ENCOUNTER — Encounter (HOSPITAL_BASED_OUTPATIENT_CLINIC_OR_DEPARTMENT_OTHER)
Admission: RE | Disposition: A | Discharge status: Home / Self Care | Payer: Self-pay | Source: Ambulatory Visit | Attending: Plastic Surgery

## 2017-03-28 ENCOUNTER — Ambulatory Visit: Payer: Self-pay | Admitting: Plastic Surgery

## 2017-03-28 DIAGNOSIS — Z881 Allergy status to other antibiotic agents status: Secondary | ICD-10-CM | POA: Insufficient documentation

## 2017-03-28 DIAGNOSIS — F419 Anxiety disorder, unspecified: Secondary | ICD-10-CM | POA: Diagnosis not present

## 2017-03-28 DIAGNOSIS — C50412 Malignant neoplasm of upper-outer quadrant of left female breast: Secondary | ICD-10-CM | POA: Diagnosis not present

## 2017-03-28 DIAGNOSIS — M8580 Other specified disorders of bone density and structure, unspecified site: Secondary | ICD-10-CM | POA: Diagnosis not present

## 2017-03-28 DIAGNOSIS — I7300 Raynaud's syndrome without gangrene: Secondary | ICD-10-CM | POA: Insufficient documentation

## 2017-03-28 DIAGNOSIS — Z882 Allergy status to sulfonamides status: Secondary | ICD-10-CM | POA: Diagnosis not present

## 2017-03-28 DIAGNOSIS — Z87891 Personal history of nicotine dependence: Secondary | ICD-10-CM | POA: Insufficient documentation

## 2017-03-28 DIAGNOSIS — Z853 Personal history of malignant neoplasm of breast: Secondary | ICD-10-CM | POA: Diagnosis not present

## 2017-03-28 DIAGNOSIS — Z9012 Acquired absence of left breast and nipple: Secondary | ICD-10-CM

## 2017-03-28 DIAGNOSIS — Z79899 Other long term (current) drug therapy: Secondary | ICD-10-CM | POA: Insufficient documentation

## 2017-03-28 DIAGNOSIS — Z421 Encounter for breast reconstruction following mastectomy: Secondary | ICD-10-CM | POA: Insufficient documentation

## 2017-03-28 DIAGNOSIS — Z85828 Personal history of other malignant neoplasm of skin: Secondary | ICD-10-CM | POA: Insufficient documentation

## 2017-03-28 DIAGNOSIS — I10 Essential (primary) hypertension: Secondary | ICD-10-CM | POA: Diagnosis not present

## 2017-03-28 DIAGNOSIS — K219 Gastro-esophageal reflux disease without esophagitis: Secondary | ICD-10-CM | POA: Insufficient documentation

## 2017-03-28 DIAGNOSIS — I739 Peripheral vascular disease, unspecified: Secondary | ICD-10-CM | POA: Diagnosis not present

## 2017-03-28 DIAGNOSIS — I341 Nonrheumatic mitral (valve) prolapse: Secondary | ICD-10-CM | POA: Diagnosis not present

## 2017-03-28 DIAGNOSIS — Z923 Personal history of irradiation: Secondary | ICD-10-CM | POA: Diagnosis not present

## 2017-03-28 DIAGNOSIS — Z170 Estrogen receptor positive status [ER+]: Secondary | ICD-10-CM | POA: Diagnosis not present

## 2017-03-28 DIAGNOSIS — C50919 Malignant neoplasm of unspecified site of unspecified female breast: Secondary | ICD-10-CM | POA: Diagnosis present

## 2017-03-28 HISTORY — PX: REMOVAL OF TISSUE EXPANDER AND PLACEMENT OF IMPLANT: SHX6457

## 2017-03-28 HISTORY — PX: BREAST REDUCTION SURGERY: SHX8

## 2017-03-28 HISTORY — PX: MASTOPEXY: SHX5358

## 2017-03-28 HISTORY — DX: Anxiety disorder, unspecified: F41.9

## 2017-03-28 SURGERY — REMOVAL, TISSUE EXPANDER, BREAST, WITH IMPLANT INSERTION
Anesthesia: General | Site: Breast | Laterality: Right

## 2017-03-28 MED ORDER — SUCCINYLCHOLINE CHLORIDE 200 MG/10ML IV SOSY
PREFILLED_SYRINGE | INTRAVENOUS | Status: AC
  Filled 2017-03-28: qty 10

## 2017-03-28 MED ORDER — FENTANYL CITRATE (PF) 100 MCG/2ML IJ SOLN
INTRAMUSCULAR | Status: AC
Start: 2017-03-28 — End: 2017-03-28
  Filled 2017-03-28: qty 2

## 2017-03-28 MED ORDER — LIDOCAINE HCL (PF) 1 % IJ SOLN
INTRAMUSCULAR | Status: AC
  Filled 2017-03-28: qty 60

## 2017-03-28 MED ORDER — GABAPENTIN 300 MG PO CAPS
ORAL_CAPSULE | ORAL | Status: AC
  Filled 2017-03-28: qty 1

## 2017-03-28 MED ORDER — SCOPOLAMINE 1 MG/3DAYS TD PT72: 1.0000 | MEDICATED_PATCH | Freq: Once | TRANSDERMAL | Status: DC | PRN

## 2017-03-28 MED ORDER — EPINEPHRINE 30 MG/30ML IJ SOLN
INTRAMUSCULAR | Status: AC
  Filled 2017-03-28: qty 1

## 2017-03-28 MED ORDER — CEFAZOLIN SODIUM-DEXTROSE 2-4 GM/100ML-% IV SOLN
INTRAVENOUS | Status: AC
  Filled 2017-03-28: qty 100

## 2017-03-28 MED ORDER — SODIUM CHLORIDE 0.9% FLUSH: 3.0000 mL | Freq: Two times a day (BID) | INTRAVENOUS | Status: DC

## 2017-03-28 MED ORDER — ACETAMINOPHEN 500 MG PO TABS: 1000.0000 mg | ORAL_TABLET | ORAL | Status: DC

## 2017-03-28 MED ORDER — CHLORHEXIDINE GLUCONATE CLOTH 2 % EX PADS: 6.0000 | MEDICATED_PAD | Freq: Once | CUTANEOUS | Status: DC

## 2017-03-28 MED ORDER — SODIUM CHLORIDE 0.9% FLUSH: 3.0000 mL | INTRAVENOUS | Status: DC | PRN

## 2017-03-28 MED ORDER — MIDAZOLAM HCL 2 MG/2ML IJ SOLN
1.0000 mg | INTRAMUSCULAR | Status: DC | PRN
  Administered 2017-03-28: 2 mg via INTRAVENOUS

## 2017-03-28 MED ORDER — BUPIVACAINE-EPINEPHRINE 0.25% -1:200000 IJ SOLN
INTRAMUSCULAR | Status: AC
  Filled 2017-03-28: qty 2

## 2017-03-28 MED ORDER — OXYCODONE HCL 5 MG PO TABS
ORAL_TABLET | ORAL | Status: AC
  Filled 2017-03-28: qty 2

## 2017-03-28 MED ORDER — DEXAMETHASONE SODIUM PHOSPHATE 4 MG/ML IJ SOLN
INTRAMUSCULAR | Status: DC | PRN
  Administered 2017-03-28: 10 mg via INTRAVENOUS

## 2017-03-28 MED ORDER — ONDANSETRON HCL 4 MG/2ML IJ SOLN
INTRAMUSCULAR | Status: AC
  Filled 2017-03-28: qty 2

## 2017-03-28 MED ORDER — LIDOCAINE 2% (20 MG/ML) 5 ML SYRINGE
INTRAMUSCULAR | Status: DC | PRN
  Administered 2017-03-28: 60 mg via INTRAVENOUS

## 2017-03-28 MED ORDER — ONDANSETRON HCL 4 MG/2ML IJ SOLN
INTRAMUSCULAR | Status: DC | PRN
  Administered 2017-03-28: 4 mg via INTRAVENOUS

## 2017-03-28 MED ORDER — DEXAMETHASONE SODIUM PHOSPHATE 10 MG/ML IJ SOLN
INTRAMUSCULAR | Status: AC
  Filled 2017-03-28: qty 1

## 2017-03-28 MED ORDER — HYDROMORPHONE HCL 1 MG/ML IJ SOLN
INTRAMUSCULAR | Status: AC
  Filled 2017-03-28: qty 0.5

## 2017-03-28 MED ORDER — LACTATED RINGERS IV SOLN
INTRAVENOUS | Status: DC
  Administered 2017-03-28: 07:00:00 via INTRAVENOUS

## 2017-03-28 MED ORDER — SODIUM CHLORIDE 0.9 % IV SOLN
INTRAVENOUS | Status: DC | PRN
  Administered 2017-03-28: 500 mL

## 2017-03-28 MED ORDER — BUPIVACAINE-EPINEPHRINE (PF) 0.25% -1:200000 IJ SOLN
INTRAMUSCULAR | Status: AC
  Filled 2017-03-28: qty 60

## 2017-03-28 MED ORDER — LIDOCAINE 2% (20 MG/ML) 5 ML SYRINGE
INTRAMUSCULAR | Status: AC
  Filled 2017-03-28: qty 5

## 2017-03-28 MED ORDER — PROPOFOL 10 MG/ML IV BOLUS
INTRAVENOUS | Status: AC
  Filled 2017-03-28: qty 40

## 2017-03-28 MED ORDER — HYDROMORPHONE HCL 1 MG/ML IJ SOLN
0.2500 mg | INTRAMUSCULAR | Status: DC | PRN
  Administered 2017-03-28: 0.5 mg via INTRAVENOUS
  Administered 2017-03-28: 0.25 mg via INTRAVENOUS
  Administered 2017-03-28 (×2): 0.5 mg via INTRAVENOUS

## 2017-03-28 MED ORDER — LACTATED RINGERS IV SOLN
INTRAVENOUS | Status: AC | PRN
  Administered 2017-03-28: 250 mL via INTRAVENOUS

## 2017-03-28 MED ORDER — PROPOFOL 10 MG/ML IV BOLUS
INTRAVENOUS | Status: DC | PRN
  Administered 2017-03-28: 200 mg via INTRAVENOUS

## 2017-03-28 MED ORDER — EPHEDRINE SULFATE-NACL 50-0.9 MG/10ML-% IV SOSY
PREFILLED_SYRINGE | INTRAVENOUS | Status: DC | PRN
  Administered 2017-03-28: 10 mg via INTRAVENOUS

## 2017-03-28 MED ORDER — BUPIVACAINE-EPINEPHRINE 0.25% -1:200000 IJ SOLN
INTRAMUSCULAR | Status: DC | PRN
  Administered 2017-03-28: 10 mL

## 2017-03-28 MED ORDER — ACETAMINOPHEN 500 MG PO TABS
ORAL_TABLET | ORAL | Status: AC
  Filled 2017-03-28: qty 2

## 2017-03-28 MED ORDER — PROMETHAZINE HCL 25 MG/ML IJ SOLN: 6.2500 mg | INTRAMUSCULAR | Status: DC | PRN

## 2017-03-28 MED ORDER — FENTANYL CITRATE (PF) 100 MCG/2ML IJ SOLN
50.0000 ug | INTRAMUSCULAR | Status: DC | PRN
  Administered 2017-03-28: 50 ug via INTRAVENOUS
  Administered 2017-03-28: 100 ug via INTRAVENOUS

## 2017-03-28 MED ORDER — LIDOCAINE HCL 1 % IJ SOLN
INTRAMUSCULAR | Status: DC | PRN
  Administered 2017-03-28: 50 mL

## 2017-03-28 MED ORDER — ACETAMINOPHEN 325 MG PO TABS: 650.0000 mg | ORAL_TABLET | ORAL | Status: DC | PRN

## 2017-03-28 MED ORDER — EPINEPHRINE PF 1 MG/ML IJ SOLN
INTRAMUSCULAR | Status: DC | PRN
  Administered 2017-03-28: 1 mg

## 2017-03-28 MED ORDER — OXYCODONE HCL 5 MG PO TABS
5.0000 mg | ORAL_TABLET | ORAL | Status: DC | PRN
  Administered 2017-03-28: 10 mg via ORAL

## 2017-03-28 MED ORDER — ARTIFICIAL TEARS OPHTHALMIC OINT
TOPICAL_OINTMENT | OPHTHALMIC | Status: AC
  Filled 2017-03-28: qty 3.5

## 2017-03-28 MED ORDER — GABAPENTIN 300 MG PO CAPS: 300.0000 mg | ORAL_CAPSULE | ORAL | Status: DC

## 2017-03-28 MED ORDER — SCOPOLAMINE 1 MG/3DAYS TD PT72
1.0000 | MEDICATED_PATCH | TRANSDERMAL | Status: DC
  Administered 2017-03-28: 1.5 mg via TRANSDERMAL

## 2017-03-28 MED ORDER — SODIUM CHLORIDE 0.9 % IV SOLN: 250.0000 mL | INTRAVENOUS | Status: DC | PRN

## 2017-03-28 MED ORDER — CEFAZOLIN SODIUM-DEXTROSE 2-4 GM/100ML-% IV SOLN
2.0000 g | INTRAVENOUS | Status: DC
  Administered 2017-03-28: 2 g via INTRAVENOUS

## 2017-03-28 MED ORDER — MIDAZOLAM HCL 2 MG/2ML IJ SOLN
INTRAMUSCULAR | Status: AC
  Filled 2017-03-28: qty 2

## 2017-03-28 MED ORDER — EPHEDRINE 5 MG/ML INJ
INTRAVENOUS | Status: AC
  Filled 2017-03-28: qty 10

## 2017-03-28 MED ORDER — SCOPOLAMINE 1 MG/3DAYS TD PT72
MEDICATED_PATCH | TRANSDERMAL | Status: AC
  Filled 2017-03-28: qty 1

## 2017-03-28 MED ORDER — ACETAMINOPHEN 650 MG RE SUPP: 650.0000 mg | RECTAL | Status: DC | PRN

## 2017-03-28 MED ORDER — CEFAZOLIN SODIUM-DEXTROSE 2-4 GM/100ML-% IV SOLN: 2.0000 g | INTRAVENOUS | Status: DC

## 2017-03-28 MED ORDER — FENTANYL CITRATE (PF) 100 MCG/2ML IJ SOLN
INTRAMUSCULAR | Status: AC
  Filled 2017-03-28: qty 2

## 2017-03-28 SURGICAL SUPPLY — 84 items
ADH SKN CLS APL DERMABOND .7 (GAUZE/BANDAGES/DRESSINGS) ×4
BAG DECANTER FOR FLEXI CONT (MISCELLANEOUS) ×4 IMPLANT
BINDER BREAST LRG (GAUZE/BANDAGES/DRESSINGS) ×3 IMPLANT
BINDER BREAST MEDIUM (GAUZE/BANDAGES/DRESSINGS) IMPLANT
BINDER BREAST XLRG (GAUZE/BANDAGES/DRESSINGS) IMPLANT
BINDER BREAST XXLRG (GAUZE/BANDAGES/DRESSINGS) IMPLANT
BIOPATCH RED 1 DISK 7.0 (GAUZE/BANDAGES/DRESSINGS) IMPLANT
BIOPATCH RED 1IN DISK 7.0MM (GAUZE/BANDAGES/DRESSINGS)
BLADE HEX COATED 2.75 (ELECTRODE) ×4 IMPLANT
BLADE KNIFE PERSONA 10 (BLADE) ×8 IMPLANT
BLADE SURG 10 STRL SS (BLADE) ×4 IMPLANT
BLADE SURG 15 STRL LF DISP TIS (BLADE) ×2 IMPLANT
BLADE SURG 15 STRL SS (BLADE) ×8
BNDG GAUZE ELAST 4 BULKY (GAUZE/BANDAGES/DRESSINGS) ×8 IMPLANT
CANISTER SUCT 1200ML W/VALVE (MISCELLANEOUS) ×4 IMPLANT
CHLORAPREP W/TINT 26ML (MISCELLANEOUS) ×4 IMPLANT
CLOSURE STERI-STRIP 1/2X4 (GAUZE/BANDAGES/DRESSINGS) ×2
CLOSURE WOUND 1/2 X4 (GAUZE/BANDAGES/DRESSINGS) ×1
CLSR STERI-STRIP ANTIMIC 1/2X4 (GAUZE/BANDAGES/DRESSINGS) ×2 IMPLANT
COVER BACK TABLE 60X90IN (DRAPES) ×4 IMPLANT
COVER MAYO STAND STRL (DRAPES) ×4 IMPLANT
DECANTER SPIKE VIAL GLASS SM (MISCELLANEOUS) IMPLANT
DERMABOND ADVANCED (GAUZE/BANDAGES/DRESSINGS) ×4
DERMABOND ADVANCED .7 DNX12 (GAUZE/BANDAGES/DRESSINGS) IMPLANT
DRAIN CHANNEL 19F RND (DRAIN) IMPLANT
DRAPE LAPAROSCOPIC ABDOMINAL (DRAPES) ×4 IMPLANT
DRSG PAD ABDOMINAL 8X10 ST (GAUZE/BANDAGES/DRESSINGS) ×8 IMPLANT
DRSG TEGADERM 2-3/8X2-3/4 SM (GAUZE/BANDAGES/DRESSINGS) IMPLANT
ELECT BLADE 4.0 EZ CLEAN MEGAD (MISCELLANEOUS) ×4
ELECT COATED BLADE 2.86 ST (ELECTRODE) ×4 IMPLANT
ELECT REM PT RETURN 9FT ADLT (ELECTROSURGICAL) ×4
ELECTRODE BLDE 4.0 EZ CLN MEGD (MISCELLANEOUS) ×2 IMPLANT
ELECTRODE REM PT RTRN 9FT ADLT (ELECTROSURGICAL) ×2 IMPLANT
EVACUATOR SILICONE 100CC (DRAIN) IMPLANT
GAUZE SPONGE 4X4 12PLY STRL (GAUZE/BANDAGES/DRESSINGS) IMPLANT
GAUZE SPONGE 4X4 12PLY STRL LF (GAUZE/BANDAGES/DRESSINGS) IMPLANT
GLOVE BIO SURGEON STRL SZ 6.5 (GLOVE) ×9 IMPLANT
GLOVE BIO SURGEONS STRL SZ 6.5 (GLOVE) ×3
GLOVE SURG SS PI 7.0 STRL IVOR (GLOVE) ×2 IMPLANT
GOWN STRL REUS W/ TWL LRG LVL3 (GOWN DISPOSABLE) ×6 IMPLANT
GOWN STRL REUS W/TWL LRG LVL3 (GOWN DISPOSABLE) ×16
IMPL GEL 275CC SMOOTH HIGH (Breast) IMPLANT
IMPLANT GEL 275CC SMOOTH HIGH (Breast) ×4 IMPLANT
IV NS 1000ML (IV SOLUTION)
IV NS 1000ML BAXH (IV SOLUTION) IMPLANT
NDL HYPO 25X1 1.5 SAFETY (NEEDLE) ×2 IMPLANT
NDL SAFETY ECLIPSE 18X1.5 (NEEDLE) ×2 IMPLANT
NEEDLE HYPO 18GX1.5 SHARP (NEEDLE) ×4
NEEDLE HYPO 25X1 1.5 SAFETY (NEEDLE) ×4 IMPLANT
NS IRRIG 1000ML POUR BTL (IV SOLUTION) ×4 IMPLANT
PACK BASIN DAY SURGERY FS (CUSTOM PROCEDURE TRAY) ×4 IMPLANT
PAD ALCOHOL SWAB (MISCELLANEOUS) IMPLANT
PENCIL BUTTON HOLSTER BLD 10FT (ELECTRODE) ×4 IMPLANT
PIN SAFETY STERILE (MISCELLANEOUS) IMPLANT
SIZER BREAST REUSE 275CC (SIZER) ×4
SIZER BRST REUSE 275CC (SIZER) IMPLANT
SLEEVE SCD COMPRESS KNEE MED (MISCELLANEOUS) ×4 IMPLANT
SPONGE LAP 18X18 X RAY DECT (DISPOSABLE) ×8 IMPLANT
STRIP CLOSURE SKIN 1/2X4 (GAUZE/BANDAGES/DRESSINGS) ×3 IMPLANT
STRIP SUTURE WOUND CLOSURE 1/2 (SUTURE) ×8 IMPLANT
SUT MNCRL AB 3-0 PS2 18 (SUTURE) ×2 IMPLANT
SUT MNCRL AB 4-0 PS2 18 (SUTURE) ×10 IMPLANT
SUT MON AB 3-0 SH 27 (SUTURE) ×4
SUT MON AB 3-0 SH27 (SUTURE) ×2 IMPLANT
SUT MON AB 5-0 PS2 18 (SUTURE) ×6 IMPLANT
SUT PDS 3-0 CT2 (SUTURE)
SUT PDS AB 2-0 CT2 27 (SUTURE) ×2 IMPLANT
SUT PDS II 3-0 CT2 27 ABS (SUTURE) IMPLANT
SUT SILK 3 0 PS 1 (SUTURE) IMPLANT
SUT VIC AB 3-0 SH 27 (SUTURE) ×4
SUT VIC AB 3-0 SH 27X BRD (SUTURE) ×2 IMPLANT
SUT VICRYL 4-0 PS2 18IN ABS (SUTURE) ×8 IMPLANT
SYR 3ML 23GX1 SAFETY (SYRINGE) ×4 IMPLANT
SYR 50ML LL SCALE MARK (SYRINGE) IMPLANT
SYR BULB IRRIGATION 50ML (SYRINGE) ×4 IMPLANT
SYR CONTROL 10ML LL (SYRINGE) ×4 IMPLANT
TAPE MEASURE VINYL STERILE (MISCELLANEOUS) ×4 IMPLANT
TOWEL OR 17X24 6PK STRL BLUE (TOWEL DISPOSABLE) ×8 IMPLANT
TUBE CONNECTING 20'X1/4 (TUBING) ×1
TUBE CONNECTING 20X1/4 (TUBING) ×3 IMPLANT
TUBING INFILTRATION IT-10001 (TUBING) IMPLANT
TUBING SET GRADUATE ASPIR 12FT (MISCELLANEOUS) IMPLANT
UNDERPAD 30X30 (UNDERPADS AND DIAPERS) ×8 IMPLANT
YANKAUER SUCT BULB TIP NO VENT (SUCTIONS) ×4 IMPLANT

## 2017-03-28 NOTE — Anesthesia Procedure Notes (Addendum)
Procedure Name: Intubation Date/Time: 03/28/2017 7:38 AM Performed by: Wanita Chamberlain Pre-anesthesia Checklist: Patient identified, Emergency Drugs available, Suction available, Patient being monitored and Timeout performed Patient Re-evaluated:Patient Re-evaluated prior to induction Oxygen Delivery Method: Circle system utilized Preoxygenation: Pre-oxygenation with 100% oxygen Induction Type: IV induction Ventilation: Mask ventilation without difficulty and Oral airway inserted - appropriate to patient size Laryngoscope Size: Mac and 3 Grade View: Grade II Tube type: Oral Tube size: 7.0 mm Number of attempts: 1 Airway Equipment and Method: Stylet Placement Confirmation: breath sounds checked- equal and bilateral,  positive ETCO2 and ETT inserted through vocal cords under direct vision Secured at: 21 cm Tube secured with: Tape Dental Injury: Teeth and Oropharynx as per pre-operative assessment  Difficulty Due To: Difficulty was anticipated and Difficult Airway- due to anterior larynx Comments: Cricoid pressure brought v. cords into view

## 2017-03-28 NOTE — Transfer of Care (Signed)
Immediate Anesthesia Transfer of Care Note  Patient: Alison [name]  Procedure(s) Performed: Procedure(s): REMOVAL OF TISSUE EXPANDER AND PLACEMENT OF IMPLANT (Left) RIGHT MAMMARY REDUCTION  (BREAST) (Right) MASTOPEXY (Right)  Patient Location: PACU  Anesthesia Type:General  Level of Consciousness: awake, alert , oriented and patient cooperative  Airway & Oxygen Therapy: Patient Spontanous Breathing and Patient connected to nasal cannula oxygen  Post-op Assessment: Report given to RN and Post -op Vital signs reviewed and stable  Post vital signs: Reviewed and stable  Last Vitals:  Vitals:   03/28/17 0634  BP: (!) 127/57  Pulse: 97  Resp: 18  Temp: 37.1 C    Last Pain:  Vitals:   03/28/17 0634  TempSrc: Oral  PainSc: 3       Patients Stated Pain Goal: 3 (67/34/19 3790)  Complications: No apparent anesthesia complications

## 2017-03-28 NOTE — H&P (Signed)
Alison [name] is an 56 y.o. female.   Chief Complaint: acquired absence of left breast HPI: The patient is a 56 y.o. yrs old wf here for further pre operative history and physical prior to exchange surgery with planned removal of left breast tissue expander and placement of left breast silicone implant and right breast reduction/mastopexy to improve her symmetry.   She recently learned that she has mets to bone / spine. She will need to start radiation treatments to her lower spine in a couple of weeks.  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 There were two lesions in the upper outer quadrant. She had chemo with favorable response but had some trouble with neuropathy. She felt a mass in the left breast and a lymph node on the left as the start of her diagnosis. She is 5 feet 3 inches tall, weighs 150 pounds, Preop bra= 38 B.  She has a vertical abdominal incision from her previous surgeries. She is not a smoker now.  MR showing negative nodes and response to the chemo.  Past Medical History:  Diagnosis Date  . Anxiety   . Asthma     seasonal asthma  . Breast cancer ([place])    metastatic breast cancer  . Complication of anesthesia 2007   aspiration pneumonia post hysterectomy.  Blood pressure would drop low- but no problems 01/2016- surgery  . History of blood transfusion   . History of mitral valve prolapse 1988   with palpitations  . History of radiation therapy 04/02/16- 05/21/16   Left Chest Wall, SCV, Axilla, IM nodes  . Hypertension    no meds  . Neuropathy    with chemo  . Osteopenia determined by x-ray   . Pneumonia    1980's and 1990's  . Raynaud's disease   . 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 Skin cancer 03/12/2014   basal- back    Past Surgical History:  Procedure Laterality Date  . ABDOMINAL HYSTERECTOMY  2007    with BSO  . APPENDECTOMY  2007  . AXILLARY LYMPH NODE DISSECTION Left 02/14/2016   Procedure: LEFT AXILLARY LYMPH NODE DISSECTION;  Surgeon: [name], MD;  Location: [facility];  Service: General;  Laterality: Left;  . BREAST RECONSTRUCTION WITH PLACEMENT OF TISSUE EXPANDER AND FLEX HD (ACELLULAR HYDRATED DERMIS) Left 02/02/2016   Procedure: IMMEDIATE LEFT BREAST RECONSTRUCTION WITH PLACEMENT OF TISSUE EXPANDER AND FLEX HD (ACELLULAR HYDRATED DERMIS);  Surgeon: [name], DO;  Location: [facility];  Service: Plastics;  Laterality: Left;  . BREAST SURGERY Left 02/02/16   mastectomy with reconstruction  . c-section  1991  . COLON RESECTION SIGMOID  12/02/2012   [facility]   . HERNIA REPAIR  2007  . LAPAROSCOPIC ENDOMETRIOSIS FULGURATION  1984, 1989  . Port- a- cath placement  07/2015  . PORT-A-CATH REMOVAL Right 02/02/2016   Procedure: REMOVAL PORT-A-CATH;  Surgeon: [name], MD;  Location: [facility];  Service: General;  Laterality: Right;  . RADIOACTIVE SEED GUIDED AXILLARY SENTINEL LYMPH NODE Left 02/02/2016   Procedure: LEFT BREAST MASTECTOMY AND RADIOACTIVE SEED GUIDED LEFT SENTINEL LYMPH NODE BIOPSY ;  Surgeon: [name], MD;  Location: [place] [place];  Service: General;  Laterality: Left;    Family History  Problem Relation Age of Onset  . Lung cancer Father    Social  History:  reports that she quit smoking about 8 years ago. Her smoking use included Cigarettes. She has a 10.00 pack-year smoking history. She has never used smokeless tobacco. She reports that she drinks alcohol. She reports that she does not use drugs.  Allergies:  Allergies  Allergen Reactions  . Sulfa Antibiotics Other (See Comments)    Blisters in mouth  . Zofran [Ondansetron Hcl] Other (See Comments)    Severe Headaches     Medications Prior to Admission  Medication Sig Dispense Refill  . Abemaciclib 100 MG TABS Take 100 mg by mouth 2 (two) times daily. 60  tablet 3  . anastrozole (ARIMIDEX) 1 MG tablet Take 1 mg by mouth daily.    . Ascorbic Acid (VITAMIN C) 1000 MG tablet Take 1,000 mg by mouth 2 (two) times daily.    . cetirizine (ZYRTEC) 10 MG tablet Take 10 mg by mouth daily.    . Cholecalciferol (VITAMIN D3) 2000 UNITS capsule Take 2,000 Units by mouth daily.     Marland Kitchen escitalopram (LEXAPRO) 10 MG tablet Take 1 tablet (10 mg total) by mouth daily. 30 tablet 12  . fluticasone (FLONASE) 50 MCG/ACT nasal spray Place 2 sprays into both nostrils daily. Reported on 11/18/2015    . gabapentin (NEURONTIN) 300 MG capsule Take 1 capsule (300 mg total) by mouth at bedtime as needed. Take 2 tablets in AM, 2 tablets mid-day, and 4 tablets at bedtime 90 capsule 4  . LORazepam (ATIVAN) 0.5 MG tablet Take 1 tablet (0.5 mg total) by mouth at bedtime. 30 tablet 3  . Multiple Vitamin (MULTIVITAMIN) tablet Take 1 tablet by mouth daily.    Marland Kitchen oxyCODONE (OXYCONTIN) 40 mg 12 hr tablet Take 1 tablet (40 mg total) by mouth 3 (three) times daily. 90 tablet 0  . oxyCODONE (ROXICODONE) 15 MG immediate release tablet Take 1 tablet (15 mg total) by mouth every 8 (eight) hours as needed for pain. 120 tablet 0  . Probiotic Product (PROBIOTIC DAILY PO) Take 1 capsule by mouth daily. Emanuel prochlorperazine (COMPAZINE) 10 MG tablet Take 1 tablet (10 mg total) by mouth every 6 (six) hours as needed (Nausea or vomiting). 30 tablet 2  . traZODone (DESYREL) 50 MG tablet Take 3 tablets (150 mg total) by mouth at bedtime. 90 tablet 0  . zolpidem (AMBIEN) 5 MG tablet Take 1 tablet (5 mg total) by mouth at bedtime as needed for sleep. 30 tablet 0    No results found for this or any previous visit (from the past 48 hour(s)). No results found.  Review of Systems  Constitutional: Negative.   HENT: Negative.   Eyes: Negative.   Respiratory: Negative.   Cardiovascular: Negative.   Gastrointestinal: Negative.   Genitourinary: Negative.    Musculoskeletal: Negative.   Skin: Negative.   Neurological: Negative.   Psychiatric/Behavioral: Negative.     Blood pressure (!) 127/57, pulse 97, temperature 98.8 F (37.1 C), temperature source Oral, resp. rate 18, height _0  (1.575 m), weight 69 kg (152 lb 3.2 oz), SpO2 100 %. Physical Exam  Constitutional: She is oriented to person, place, and time. She appears well-developed.  HENT:  Head: Normocephalic and atraumatic.  Eyes: Pupils are equal, round, and reactive to light. EOM are normal.  Cardiovascular: Normal rate.   Respiratory: Effort normal. No respiratory distress.  GI: Soft. She exhibits no distension.  Neurological: She is alert and oriented to person, place, and time.  Skin: Skin is warm. No rash noted. No erythema.  Psychiatric: She has a normal mood and affect. Her behavior is normal. Judgment and thought content normal.     Assessment/Plan Plan for removal of left breast expander and placement of implant with right mastopexy for symmetry.  [name], DO 03/28/2017, 7:08 AM

## 2017-03-28 NOTE — Anesthesia Postprocedure Evaluation (Signed)
Anesthesia Post Note  Patient: Alison [name]  Procedure(s) Performed: Procedure(s) (LRB): REMOVAL OF TISSUE EXPANDER AND PLACEMENT OF IMPLANT (Left) RIGHT MAMMARY REDUCTION  (BREAST) (Right) MASTOPEXY (Right)     Patient location during evaluation: PACU Anesthesia Type: General Level of consciousness: sedated Pain management: pain level controlled Vital Signs Assessment: post-procedure vital signs reviewed and stable Respiratory status: spontaneous breathing and respiratory function stable Cardiovascular status: stable Anesthetic complications: no    Last Vitals:  Vitals:   03/28/17 1045 03/28/17 1114  BP: 128/69 (!) 145/69  Pulse: (!) 109 (!) 112  Resp: 14   Temp:  36.9 C    Last Pain:  Vitals:   03/28/17 1114  TempSrc:   PainSc: 6                  Jhoselyn Ruffini DANIEL

## 2017-03-28 NOTE — Anesthesia Preprocedure Evaluation (Deleted)
Anesthesia Evaluation    Airway        Dental   Pulmonary former smoker,           Cardiovascular hypertension,      Neuro/Psych    GI/Hepatic   Endo/Other    Renal/GU      Musculoskeletal   Abdominal   Peds  Hematology   Anesthesia Other Findings S/P breast CA.  Now + mets  Reproductive/Obstetrics                           Anesthesia Physical Anesthesia Plan Anesthesia Quick Evaluation

## 2017-03-28 NOTE — Op Note (Signed)
Op report Unilateral Breast Exchange   DATE OF OPERATION:  03/28/2017  LOCATION: [facility]  SURGICAL DIVISION: Plastic Surgery  PREOPERATIVE DIAGNOSES:  1. History of left breast cancer.  2. Acquired absence of left breast.  3. Breast asymmetry after surgery.  POSTOPERATIVE DIAGNOSES:  1. History of left breast cancer.  2. Acquired absence of left breast.  3. Breast asymmetry after surgery.  PROCEDURE:  1. Exchange of left tissue expander for implant. 2. Capsulotomies for implant respositioning. 3. Right breast mastopexy / reduction 4. Liposuction of the right breast 302 gm  SURGEON: Rosanna Bickle Sanger Denorris Reust, DO  ASSISTANT: Shawn Rayburn, PA  ANESTHESIA:  General.   COMPLICATIONS: None.   IMPLANTS: Mentor Smooth Round High Profile Gel 275cc. Ref #[id number].  Serial Number [id number]  INDICATIONS FOR PROCEDURE:  The patient, Alison [name], is a 56 y.o. female born on [birth year], is here for treatment for further treatment after a mastectomy and placement of a tissue expander. She now presents for exchange of her expander for an implant.  She requires capsulotomies to better position the implant. She also desires a right mastopexy reduction for symmetry. MRN: [id number]  CONSENT:  Informed consent was obtained directly from the patient. Risks, benefits and alternatives were fully discussed. Specific risks including but not limited to bleeding, infection, hematoma, seroma, scarring, pain, implant infection, implant extrusion, capsular contracture, asymmetry, wound healing problems, and need for further surgery were all discussed. The patient did have an ample opportunity to have her questions answered to her satisfaction.   DESCRIPTION OF PROCEDURE:  The patient was taken to the operating room. SCDs were placed and IV antibiotics were given. The patient's chest was prepped and draped in a sterile fashion. A time out was performed and the implants to be  used were identified.  One percent Xylocaine with epinephrine was used to infiltrate the area.   Left: The old mastectomy scar was opened and superior mastectomy and inferior mastectomy flaps were re-raised over the pectoralis major muscle. The pectoralis was split to expose the tissue expander which was removed. Inspection of the pocket showed a normal healthy capsule and good integration of the biologic matrix.   Circumferential capsulotomies were performed to allow for breast pocket expansion.  The capsules were very thick and firm due to the radiation. Measurements were made to confirm adequate pocket size for the implant dimensions.  Hemostasis was ensured with electrocautery.  The pocket was irrigated with antibiotic solution.  New gloves were placed.  The implant was placed in the pocket and oriented appropriately. The pectoralis major muscle and capsule on the anterior surface were re-closed with a 3-0 running Monocryl suture. The remaining skin was closed with 4-0 Monocryl deep dermal and 5-0 Monocryl subcuticular stitches.  Dermabond was applied.  A breast binder and ABD was applied.  The patient was allowed to wake from anesthesia and taken to the recovery room in satisfactory condition.   Right side: Preoperative markings were confirmed.  Incision lines were injected with 1% Xylocaine with epinephrine.  After waiting for vasoconstriction, the marked lines were incised.  A Wise-pattern superomedial breast reduction was performed by de-epithelializing the pedicle, using bovie to create the superomedial pedicle, and removing breast tissue from the superior, lateral, and inferior portions of the breast.  Tumescent was placed in the lateral and lower breast area.  Liposuction was done with removal of 302 grm. Care was taken to not undermine the breast pedicle. Hemostasis was achieved.  The nipple was gently  rotated into position and the soft tissue closed with 4-0 Monocryl.   The pocket was irrigated  and hemostasis confirmed.  The deep tissues were approximated with 3-0 monocryl sutures and the skin was closed with deep dermal and subcuticular 4-0 Monocryl sutures.  The nipple and skin flaps had good capillary refill at the end of the procedure.   Dermabond was applied.  A breast binder and ABDs were placed.  The nipple and skin flaps had good capillary refill at the end of the procedure.  The patient tolerated the procedure well. The patient was allowed to wake from anesthesia and taken to the recovery room in satisfactory condition

## 2017-03-28 NOTE — Discharge Instructions (Signed)
°  Post Anesthesia Home Care Instructions  Activity: Get plenty of rest for the remainder of the day. A responsible individual must stay with you for 24 hours following the procedure.  For the next 24 hours, DO NOT: -Drive a car -Paediatric nurse -Drink alcoholic beverages -Take any medication unless instructed by your physician -Make any legal decisions or sign important papers.  Meals: Start with liquid foods such as gelatin or soup. Progress to regular foods as tolerated. Avoid greasy, spicy, heavy foods. If nausea and/or vomiting occur, drink only clear liquids until the nausea and/or vomiting subsides. Call your physician if vomiting continues.  Special Instructions/Symptoms: Your throat may feel dry or sore from the anesthesia or the breathing tube placed in your throat during surgery. If this causes discomfort, gargle with warm salt water. The discomfort should disappear within 24 hours.  If you had a scopolamine patch placed behind your ear for the management of post- operative nausea and/or vomiting:  1. The medication in the patch is effective for 72 hours, after which it should be removed.  Wrap patch in a tissue and discard in the trash. Wash hands thoroughly with soap and water. 2. You may remove the patch earlier than 72 hours if you experience unpleasant side effects which may include dry mouth, dizziness or visual disturbances. 3. Avoid touching the patch. Wash your hands with soap and water after contact with the patch.    May shower tomorrow. No heavy lifting

## 2017-03-29 ENCOUNTER — Encounter (HOSPITAL_BASED_OUTPATIENT_CLINIC_OR_DEPARTMENT_OTHER): Payer: Self-pay | Admitting: Plastic Surgery

## 2017-03-29 DIAGNOSIS — Z510 Encounter for antineoplastic radiation therapy: Secondary | ICD-10-CM | POA: Diagnosis not present

## 2017-03-31 ENCOUNTER — Other Ambulatory Visit: Payer: Self-pay | Admitting: Oncology

## 2017-03-31 DIAGNOSIS — Z170 Estrogen receptor positive status [ER+]: Principal | ICD-10-CM

## 2017-03-31 DIAGNOSIS — C779 Secondary and unspecified malignant neoplasm of lymph node, unspecified: Secondary | ICD-10-CM

## 2017-03-31 DIAGNOSIS — C50412 Malignant neoplasm of upper-outer quadrant of left female breast: Secondary | ICD-10-CM

## 2017-03-31 DIAGNOSIS — C50912 Malignant neoplasm of unspecified site of left female breast: Secondary | ICD-10-CM

## 2017-04-01 ENCOUNTER — Other Ambulatory Visit (HOSPITAL_BASED_OUTPATIENT_CLINIC_OR_DEPARTMENT_OTHER): Payer: 59

## 2017-04-01 ENCOUNTER — Ambulatory Visit (HOSPITAL_BASED_OUTPATIENT_CLINIC_OR_DEPARTMENT_OTHER): Payer: 59 | Admitting: Adult Health

## 2017-04-01 ENCOUNTER — Ambulatory Visit
Admission: RE | Admit: 2017-04-01 | Discharge: 2017-04-01 | Disposition: A | Discharge status: Home / Self Care | Payer: 59 | Source: Ambulatory Visit | Attending: Radiation Oncology | Admitting: Radiation Oncology

## 2017-04-01 ENCOUNTER — Encounter: Payer: 59 | Admitting: *Deleted

## 2017-04-01 ENCOUNTER — Encounter: Payer: Self-pay | Admitting: Adult Health

## 2017-04-01 VITALS — BP 162/74 | HR 115 | Temp 98.8°F | Resp 18 | Ht 62.0 in | Wt 152.1 lb

## 2017-04-01 DIAGNOSIS — C50412 Malignant neoplasm of upper-outer quadrant of left female breast: Secondary | ICD-10-CM | POA: Diagnosis not present

## 2017-04-01 DIAGNOSIS — Z510 Encounter for antineoplastic radiation therapy: Secondary | ICD-10-CM | POA: Diagnosis not present

## 2017-04-01 DIAGNOSIS — Z006 Encounter for examination for normal comparison and control in clinical research program: Secondary | ICD-10-CM

## 2017-04-01 DIAGNOSIS — Z170 Estrogen receptor positive status [ER+]: Secondary | ICD-10-CM

## 2017-04-01 DIAGNOSIS — C7951 Secondary malignant neoplasm of bone: Secondary | ICD-10-CM | POA: Diagnosis not present

## 2017-04-01 DIAGNOSIS — C779 Secondary and unspecified malignant neoplasm of lymph node, unspecified: Principal | ICD-10-CM

## 2017-04-01 DIAGNOSIS — C50912 Malignant neoplasm of unspecified site of left female breast: Secondary | ICD-10-CM

## 2017-04-01 DIAGNOSIS — Z79811 Long term (current) use of aromatase inhibitors: Secondary | ICD-10-CM

## 2017-04-01 LAB — CBC WITH DIFFERENTIAL/PLATELET
BASO%: 0.3 % (ref 0.0–2.0)
BASOS ABS: 0 10*3/uL (ref 0.0–0.1)
EOS%: 2.9 % (ref 0.0–7.0)
Eosinophils Absolute: 0.1 10*3/uL (ref 0.0–0.5)
HCT: 34.4 % — ABNORMAL LOW (ref 34.8–46.6)
HGB: 11.6 g/dL (ref 11.6–15.9)
LYMPH%: 16.9 % (ref 14.0–49.7)
MCH: 33.9 pg (ref 25.1–34.0)
MCHC: 33.9 g/dL (ref 31.5–36.0)
MCV: 100.1 fL (ref 79.5–101.0)
MONO#: 0.2 10*3/uL (ref 0.1–0.9)
MONO%: 6 % (ref 0.0–14.0)
NEUT#: 2 10*3/uL (ref 1.5–6.5)
NEUT%: 73.9 % (ref 38.4–76.8)
Platelets: 314 10*3/uL (ref 145–400)
RBC: 3.44 10*6/uL — ABNORMAL LOW (ref 3.70–5.45)
RDW: 18.1 % — AB (ref 11.2–14.5)
WBC: 2.7 10*3/uL — ABNORMAL LOW (ref 3.9–10.3)
lymph#: 0.5 10*3/uL — ABNORMAL LOW (ref 0.9–3.3)

## 2017-04-01 LAB — COMPREHENSIVE METABOLIC PANEL
ALBUMIN: 3.6 g/dL (ref 3.5–5.0)
ALT: 14 U/L (ref 0–55)
AST: 22 U/L (ref 5–34)
Alkaline Phosphatase: 111 U/L (ref 40–150)
Anion Gap: 9 mEq/L (ref 3–11)
BUN: 5.8 mg/dL — AB (ref 7.0–26.0)
CHLORIDE: 111 meq/L — AB (ref 98–109)
CO2: 22 mEq/L (ref 22–29)
Calcium: 8.4 mg/dL (ref 8.4–10.4)
Creatinine: 0.8 mg/dL (ref 0.6–1.1)
EGFR: 86 mL/min/{1.73_m2} — ABNORMAL LOW (ref >90)
GLUCOSE: 135 mg/dL (ref 70–140)
POTASSIUM: 4 meq/L (ref 3.5–5.1)
SODIUM: 142 meq/L (ref 136–145)
Total Bilirubin: 0.52 mg/dL (ref 0.20–1.20)
Total Protein: 7 g/dL (ref 6.4–8.3)

## 2017-04-01 NOTE — Progress Notes (Signed)
04/01/2017 AFT-05 PALLASstudy  Patient in to clinic today for follow-up visit and completion of AFT-05 PALLAS study End of Treatment Phase assessments. Patient reports surgical site pain due to breast surgery last week. Otherwise, she has no new additional complaints from previous. Patient is aware that she is entering the study Follow-up Phase and that she will continue to be monitored for disease status and new anti-cancer therapies, as well as any serious adverse events related to palbociclib. Thanked patient for her study participation, and noted that I would be in touch with her if there is any additional information needed, other than that to be collected through chart review. Patient is in agreement with the plan and appreciates the opportunity to participate. Cindy S. Brigitte Pulse BSN, RN, [place] 04/01/2017 3:03 PM  Adverse Event Log Study/Protocol: AFT-05 PALLAS Cycle 6 to EOTP: 12/20/2016 - 04/01/2017 Event Grade Onset Date Resolved Date Attribution to palbociclib Attribution to anastrozole Comments  Anemia, int. 1 08/21/2016 ongoing Yes No   Leukopenia 2 12/20/2016 01/29/2017 Yes No   Leukopenia 2 03/19/2017 ongoing Yes No   Neutropenia 2 12/20/2016 01/16/2017 Yes No   Neutropenia 3 01/16/2017 01/29/2017 Yes No   Fatigue 1 10/24/2016 02/28/2017 Yes Yes   Fatigue 2 02/28/2017 ongoing Yes Yes   AST increased 1 12/20/2016 01/16/2017 Yes No   Lymphopenia 1 01/16/2017 01/29/2017 Yes No   Lymphopenia 2 02/28/2017 ongoing Yes No   Weight loss 1 01/29/2017 02/18/2017 No No   Heartburn, int. 1 01/29/2017 ongoing No No   Low back pain 2 02/01/2017 ongoing No No Secondary to bone mets  confusion 2 02/28/2017 ongoing No No R/T narcotics  Hyponatremia 1 02/28/2017 03/11/2017 No No   Hyperkalemia 2 02/28/2017 03/11/2017 No No   Elevated creatinine 3 02/28/2017 03/11/2017 No No   AST increased 1 02/28/2017 03/11/2017 No No   Hypotension 1 02/28/2017 03/19/2017 No No   Constipation 2 03/19/2017 ongoing No No   Hypertension 3 04/01/2017 ongoing No No    Chemistry abnormalities on 02/28/2017 felt to be related to imaging contrast. Additional out of range lab abnormalities (hematology and chemistry) during the reporting period were not felt to be clinically significant. Cindy S. Brigitte Pulse BSN, RN, [place] 05/02/2017 1:27 PM

## 2017-04-01 NOTE — Progress Notes (Signed)
Pt here for patient teaching.  Pt given Radiation and You booklet, skin care instructions, Alra deodorant, Radiaplex gel and Sonafine.  Reviewed areas of pertinence such as fatigue and skin changes . Pt able to give teach back of to pat skin, use unscented/gentle soap and drink plenty of water, Pt verbalizes understanding of information given and will contact nursing with any questions or concerns.     [email or web address]

## 2017-04-01 NOTE — Progress Notes (Addendum)
[place]  Telephone:(336) [phone] Fax:(336) [phone]   ID: Kearney Hard DOB: 04/29/1961  MR#: [id number]  HQI#:[id number]  Patient Care Team: Alison Pao, MD as PCP - General (Internal Medicine) Alison Luna, MD as Consulting Physician (General Surgery) [name], Alison Dad, MD as Consulting Physician (Oncology) Alison Gell, MD as Consulting Physician (Obstetrics and Gynecology) Alison Sine, MD as Consulting Physician (Dermatology) Alison Norway, RN as Registered Nurse (Oncology) Alison Gibson, MD as Attending Physician (Radiation Oncology) PCP: Alison Pao, MD OTHER MD:  CHIEF COMPLAINT: Estrogen receptor positive breast cancer  CURRENT TREATMENT: Anastrozole, fulvestrant, abemaciclib, denosumab/Xgeva  INTERVAL HISTORY: Alison [name] here today for evalaution.  She [name] taking Anastrozole and Abemaciclib daily.  She [name] tolerating these well.  She receives Fulvestrant and Xgeva every 4 weeks and [name] not having any issues with these.  Her pain [name] controlled with oxycontin and oxycodone.  She continues to recover from her implant placement last week.  She [name] beginning radiation on her spine lesions today.  She denies any diarrhea.    REVIEW OF SYSTEMS: A detailed ROS was conducted and [name] otherwise non contributory except what [name] noted above.    BREAST CANCER HISTORY: From the original intake note:  Alison [name] had routine screening mammography with tomography [facility] February 20 11/14/2014 showing a possible mass in the right breast. Diagnostic right mammography with right breast ultrasonography 11/01/2014 found the breast density to be category C. In the upper outer quadrant of the right breast there was a partially obscured mass which on physical exam was palpable as an area of thickening at the 10:00 position 8 cm from the nipple. Ultrasound showed multiple cysts in the upper outer right breast corresponding to the mass in question.  In  November 2016 the patient felt a change in the upper left breast and brought it to her primary care physician's attention. Diagnostic left mammography with tomosynthesis and left breast ultrasonography at the [facility] December 01/14/2015 found trabecular thickening throughout the left breast with skin thickening, but no circumscribed mass or suspicious calcifications. A rounded mass in the left axilla measured 6 mm. There was an additional mildly prominent lymph node in the left axilla which was what the patient had been palpating. Ultrasonography showed ill-defined swelling throughout the inner left breast with overlying skin thickening.. At the 2:00 position 4 cm from the nipple there was an irregular hypoechoic mass measuring 2.3 cm. A second mass at the 1:00 area 3 cm from the nipple measured 1 cm. The distance between these 2 masses was 1.8 cm. There was also a round hypoechoic left axillary mass measuring 0.6 cm. Overall the was an area of 3.7 cm of mixed echogenicity corresponding to the area of palpable soft tissue swelling.   On 08/04/2015 the patient underwent biopsy of the mass at the 2:00 axis 4 cm from the nipple and a second biopsy was performed of the hypoechoic mass at the 1:00 position 3 cm from the nipple. Biopsy of the suspicious nodule in the lower left axilla was attempted but could not be completed as the mass became obscured after administration of lidocaine.  The pathology from both left breast biopsies 08/04/2015 ([id number]) showed invasive ductal carcinoma, grade 3. The prognostic profile obtained from the larger tumor showed estrogen receptor to be strongly positive at 95%, progesterone receptor positive at 30%, with an MIB-1 of 12%, and no HER-2 amplification, the signals ratio being 1.22 and the number per cell 2.20.  On 08/09/2015 the patient underwent biopsy of this suspicious left axillary lymph node noted above, and this showed ([id number]) metastatic carcinoma with  extracapsular extension.  The patient's subsequent history [name] as detailed below    PAST MEDICAL HISTORY: Past Medical History:  Diagnosis Date  . Anxiety   . Asthma     seasonal asthma  . Breast cancer ([place])    metastatic breast cancer  . Complication of anesthesia 2007   aspiration pneumonia post hysterectomy.  Blood pressure would drop low- but no problems 01/2016- surgery  . History of blood transfusion   . History of mitral valve prolapse 1988   with palpitations  . History of radiation therapy 04/02/16- 05/21/16   Left Chest Wall, SCV, Axilla, IM nodes  . Hypertension    no meds  . Neuropathy    with chemo  . Osteopenia determined by x-ray   . Pneumonia    1980's and 1990's  . Raynaud's disease   . 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 Skin cancer 03/12/2014   basal- back    PAST SURGICAL HISTORY: Past Surgical History:  Procedure Laterality Date  . ABDOMINAL HYSTERECTOMY  2007   with BSO  . APPENDECTOMY  2007  . AXILLARY LYMPH NODE DISSECTION Left 02/14/2016   Procedure: LEFT AXILLARY LYMPH NODE DISSECTION;  Surgeon: Alison Klein, MD;  Location: [facility];  Service: General;  Laterality: Left;  . BREAST RECONSTRUCTION WITH PLACEMENT OF TISSUE EXPANDER AND FLEX HD (ACELLULAR HYDRATED DERMIS) Left 02/02/2016   Procedure: IMMEDIATE LEFT BREAST RECONSTRUCTION WITH PLACEMENT OF TISSUE EXPANDER AND FLEX HD (ACELLULAR HYDRATED DERMIS);  Surgeon: Alison Going, DO;  Location: [facility];  Service: Plastics;  Laterality: Left;  . BREAST REDUCTION SURGERY Right 03/28/2017   Procedure: RIGHT MAMMARY REDUCTION  (BREAST);  Surgeon: Alison Going, DO;  Location: [facility];  Service: Plastics;  Laterality: Right;  . BREAST SURGERY Left 02/02/16   mastectomy with reconstruction  . c-section  1991  . COLON RESECTION SIGMOID  12/02/2012   [facility]   . HERNIA REPAIR  2007  . LAPAROSCOPIC ENDOMETRIOSIS FULGURATION   1984, 1989  . MASTOPEXY Right 03/28/2017   Procedure: MASTOPEXY;  Surgeon: Alison Going, DO;  Location: [facility];  Service: Plastics;  Laterality: Right;  . Port- a- cath placement  07/2015  . PORT-A-CATH REMOVAL Right 02/02/2016   Procedure: REMOVAL PORT-A-CATH;  Surgeon: Alison Klein, MD;  Location: [facility];  Service: General;  Laterality: Right;  . RADIOACTIVE SEED GUIDED AXILLARY SENTINEL LYMPH NODE Left 02/02/2016   Procedure: LEFT BREAST MASTECTOMY AND RADIOACTIVE SEED GUIDED LEFT SENTINEL LYMPH NODE BIOPSY ;  Surgeon: Alison Klein, MD;  Location: [facility];  Service: General;  Laterality: Left;  . REMOVAL OF TISSUE EXPANDER AND PLACEMENT OF IMPLANT Left 03/28/2017   Procedure: REMOVAL OF TISSUE EXPANDER AND PLACEMENT OF IMPLANT;  Surgeon: Alison Going, DO;  Location: [facility];  Service: Plastics;  Laterality: Left;    FAMILY HISTORY Family History  Problem Relation Age of Onset  . Lung cancer Father    the patient's father died from lung cancer at the age of 69 in the setting of tobacco abuse. The patient's mother died at the age of 79 with pancreatic cancer. The patient had no siblings. There [name] no history of breast or ovarian cancer in the family.  GYNECOLOGIC HISTORY:  No LMP recorded. Patient has  had a hysterectomy. Menarche age 32, first live birth age 15. The patient [name] GX P1. She [name] status post total hysterectomy with bilateral salpingo-oophorectomy. She has been on hormone replacement since that time and [name] tapering to off at the time of this dictation.   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 She used to work at [employer]. Her husband Alison [name] works for [employer] division at a [employer] their son Alison [name] [name] currently at home.    ADVANCED DIRECTIVES: Not in place   HEALTH MAINTENANCE: Social History  Substance Use Topics  . Smoking status: Former Smoker     Packs/day: 1.00    Years: 10.00    Types: Cigarettes    Quit date: 11/25/2008  . Smokeless tobacco: Never Used     Comment: on and off smoker never consistent  . Alcohol use Yes     Comment: social     Colonoscopy: April 2014/ [facility]naldo Op[facility]  PAP: s/p hysterectomy  Bone density: August 2016/ osteopenia  Lipid panel:  Allergies  Allergen Reactions  . Sulfa Antibiotics Other (See Comments)    Blisters in mouth  . Zofran [Ondansetron Hcl] Other (See Comments)    Severe Headaches     Current Outpatient Prescriptions  Medication Sig Dispense Refill  . Abemaciclib 100 MG TABS Take 100 mg by mouth 2 (two) times daily. 60 tablet 3  . anastrozole (ARIMIDEX) 1 MG tablet Take 1 mg by mouth daily.    . Ascorbic Acid (VITAMIN C) 1000 MG tablet Take 1,000 mg by mouth 2 (two) times daily.    . cetirizine (ZYRTEC) 10 MG tablet Take 10 mg by mouth daily.    . Cholecalciferol (VITAMIN D3) 2000 UNITS capsule Take 2,000 Units by mouth daily.     Marland Kitchen escitalopram (LEXAPRO) 10 MG tablet Take 1 tablet (10 mg total) by mouth daily. 30 tablet 12  . fluticasone (FLONASE) 50 MCG/ACT nasal spray Place 2 sprays into both nostrils daily. Reported on 11/18/2015    . gabapentin (NEURONTIN) 300 MG capsule Take 1 capsule (300 mg total) by mouth at bedtime as needed. Take 2 tablets in AM, 2 tablets mid-day, and 4 tablets at bedtime 90 capsule 4  . LORazepam (ATIVAN) 0.5 MG tablet Take 1 tablet (0.5 mg total) by mouth at bedtime. 30 tablet 3  . Multiple Vitamin (MULTIVITAMIN) tablet Take 1 tablet by mouth daily.    Marland Kitchen oxyCODONE (OXYCONTIN) 40 mg 12 hr tablet Take 1 tablet (40 mg total) by mouth 3 (three) times daily. 90 tablet 0  . oxyCODONE (ROXICODONE) 15 MG immediate release tablet Take 1 tablet (15 mg total) by mouth every 8 (eight) hours as needed for pain. 120 tablet 0  . Probiotic Product (PROBIOTIC DAILY PO) Take 1 capsule by mouth daily. [place]  prochlorperazine (COMPAZINE) 10 MG tablet Take 1 tablet (10 mg total) by mouth every 6 (six) hours as needed (Nausea or vomiting). 30 tablet 2  . traZODone (DESYREL) 50 MG tablet Take 3 tablets (150 mg total) by mouth at bedtime. 90 tablet 0  . zolpidem (AMBIEN) 5 MG tablet Take 1 tablet (5 mg total) by mouth at bedtime as needed for sleep. 30 tablet 0   No current facility-administered medications for this visit.     OBJECTIVE:   Vitals:   04/01/17 1419  BP: (!) 162/74  Pulse: (!) 115  Resp: 18  Temp: 98.8 F (37.1  C)     Body mass index [name] 27.82 kg/m.    ECOG FS:1 - Symptomatic but completely ambulatory  GENERAL: Patient [name] a well appearing female in no acute distress HEENT:  Sclerae anicteric.  Oropharynx clear and moist. No ulcerations or evidence of oropharyngeal candidiasis. Neck [name] supple.  NODES:  No cervical, supraclavicular, or axillary lymphadenopathy palpated.  BREAST EXAM:  Deferred. LUNGS:  Clear to auscultation bilaterally.  No wheezes or rhonchi. HEART:  Regular rate and rhythm. No murmur appreciated. ABDOMEN:  Soft, nontender.  Positive, normoactive bowel sounds. No organomegaly palpated. MSK:  No focal spinal tenderness to palpation. Full range of motion bilaterally in the upper extremities. EXTREMITIES:  No peripheral edema.   SKIN:  Clear with no obvious rashes or skin changes. No nail dyscrasia. NEURO:  Nonfocal. Well oriented.  Appropriate affect.    LAB RESULTS:  CMP     Component Value Date/Time   NA 142 04/01/2017 1359   K 4.0 04/01/2017 1359   CL 110 02/15/2016 0330   CO2 22 04/01/2017 1359   GLUCOSE 135 04/01/2017 1359   BUN 5.8 (L) 04/01/2017 1359   CREATININE 0.8 04/01/2017 1359   CALCIUM 8.4 04/01/2017 1359   PROT 7.0 04/01/2017 1359   ALBUMIN 3.6 04/01/2017 1359   AST 22 04/01/2017 1359   ALT 14 04/01/2017 1359   ALKPHOS 111 04/01/2017 1359   BILITOT 0.52 04/01/2017 1359   GFRNONAA >60 02/15/2016 0330   GFRAA >60 02/15/2016 0330     INo results found for: SPEP, UPEP  Lab Results  Component Value Date   WBC 2.7 (L) 04/01/2017   NEUTROABS 2.0 04/01/2017   HGB 11.6 04/01/2017   HCT 34.4 (L) 04/01/2017   MCV 100.1 04/01/2017   PLT 314 04/01/2017      Chemistry      Component Value Date/Time   NA 142 04/01/2017 1359   K 4.0 04/01/2017 1359   CL 110 02/15/2016 0330   CO2 22 04/01/2017 1359   BUN 5.8 (L) 04/01/2017 1359   CREATININE 0.8 04/01/2017 1359      Component Value Date/Time   CALCIUM 8.4 04/01/2017 1359   ALKPHOS 111 04/01/2017 1359   AST 22 04/01/2017 1359   ALT 14 04/01/2017 1359   BILITOT 0.52 04/01/2017 1359       No results found for: LABCA2  No components found for: LABCA125  No results for input(s): INR in the last 168 hours.  Urinalysis    Component Value Date/Time   COLORURINE YELLOW 11/12/2015 [place] 11/12/2015 0837   LABSPEC 1.005 03/18/2017 1223   PHURINE 6.0 03/18/2017 1223   PHURINE 6.5 11/12/2015 0837   GLUCOSEU Negative 03/18/2017 1223   HGBUR Large 03/18/2017 1223   HGBUR MODERATE (A) 11/12/2015 0837   BILIRUBINUR Negative 03/18/2017 1223   KETONESUR Negative 03/18/2017 [place] 11/12/2015 0837   PROTEINUR Negative 03/18/2017 1223   PROTEINUR NEGATIVE 11/12/2015 0837   UROBILINOGEN 0.2 03/18/2017 1223   NITRITE Negative 03/18/2017 1223   NITRITE NEGATIVE 11/12/2015 0837   LEUKOCYTESUR Moderate 03/18/2017 1223    STUDIES: Bone density January 2018 shows T score of -1.7  PROTOCOLS: BWEL, ABC and PALLAS, [place] study  ASSESSMENT: 56 y.o. [place] woman status post biopsy of 2 separate masses in the left breast upper outer quadrant 08/04/2015, clinically mT2 N1, stage IIb/IIIa invasive ductal carcinoma, grade 3, the larger mass being estrogen and progesterone receptor positive, HER-2 negative, with an MIB-1 of 12%  (  a) biopsy of a left axillary lymph node 08/09/2015 was positive, with extracapsular extension  (1)  neoadjuvant chemotherapy started 08/25/2015 consisting of doxorubicin and cyclophosphamide in dose dense fashion 4, completed 10/06/2015,  followed by weekly paclitaxel 10 completed 12/23/2015  (a) final 2 planned cycles of weekly paclitaxel omitted because of neuropathy concerns  (2) status post left mastectomy with targeted axillary dissection 02/02/2016 for a ypT2 ypN2a, stage IIIa invasive ductal carcinoma, grade 2, with negative margins, repeat prognostic panel showing estrogen receptor positive, but progesterone receptor and HER-2 negative  (a) completion axillary lymph node dissection 02/14/2016 showed an additional 2 out of 8 lymph nodes to be involved (final count 6 out of 10 lymph nodes positive  (b) expander placement at the time of left mastectomy  (3) adjuvant capecitabine starting concurrently with radiation--discontinued 05/21/2016  (4) adjuvant  radiation started 04/02/2016, completed 05/21/2016  (5) anastrozole started October 2017  (a) bone density August 2016 showed osteopenia  (b) repeat bone density 09/07/2016 shows a T score of -1.7.  (c) the patient [name] status post total abdominal hysterectomy with bilateral salpingo-oophorectomy.  (6) postoperative pain?: Starting OxyContin 20 mg twice a day with oxycodone as needed for breakthrough pain 03/02/2016  (a) failed transition to tramadol, gabapentin, naproxen   (b) started methadone 5 mg TID as of 12/20/2016-- not tolerated  (c) robaxin added June 2018 to NSAIDS and tylenol  (7) Research:  (a) B-WEL study ([facility] [id number])--enrolled in Arm 1 (received education)  (b) PALLAS AFT-05 study: randomized to treatment, started palbociclib 07/26/2016   (i) dose reduced to 100 mg, then to 75 mg because of cytopenias   (ii) went off study study May 2018 because of persistent cytopenias  (c) taxane study 12/20/2016  (d) aspirin study  METASTATIC DISEASE 02/15/2017: CT scan of the lumbar spine and 02/27/2017 CT of the thoracic  and cervical spine shows multiple bone lesions but no evidence of cord compromise; bone lesions are confirmed on PET scan 02/22/2017 which however shows no evidence of visceral disease  (a) CA-27-29 [name] noninformative  (8) continuing anastrozole, abemaciclib added 02/22/2017, fulvestrant started 02/22/2017 ,   (9) yearly zolendronate given 09/26/2016, changed to monthly denosumab/Xgeva starting 03/19/2017  Other issues: (a) poorly controlled pain: Currently on OxyContin 20m po TID, Oxycodone 129mPO every 6 hours PRN  (b) left upper lobe changes noted on PET scan 02/22/2017 felt to be secondary to radiation    PLAN:  LeVaedas doing well today.  She will continue the abemaciclib and anastrozole.  I have set her up for her next Fulvestrant and Xgeva injection appointment.  She will proceed with radiation today.  Her pain [name] controlled with oxycontin and oxycodone.    LeIanthaill return on 8/21 for labs and follow up with Dr. MaJana Hakim She knows to call for any reason prior to her next appointment.    A total of (30) minutes of face-to-face time was spent with this patient with greater than 50% of that time in counseling and care-coordination.   :LScot DockNP   04/01/2017 10:18 PM

## 2017-04-02 ENCOUNTER — Ambulatory Visit: Payer: 59

## 2017-04-02 ENCOUNTER — Ambulatory Visit: Payer: 59 | Admitting: Physical Therapy

## 2017-04-03 ENCOUNTER — Other Ambulatory Visit: Payer: Self-pay | Admitting: Oncology

## 2017-04-03 ENCOUNTER — Ambulatory Visit: Payer: 59

## 2017-04-03 DIAGNOSIS — C50412 Malignant neoplasm of upper-outer quadrant of left female breast: Secondary | ICD-10-CM

## 2017-04-03 DIAGNOSIS — Z170 Estrogen receptor positive status [ER+]: Principal | ICD-10-CM

## 2017-04-03 DIAGNOSIS — C7951 Secondary malignant neoplasm of bone: Secondary | ICD-10-CM

## 2017-04-03 DIAGNOSIS — C50912 Malignant neoplasm of unspecified site of left female breast: Secondary | ICD-10-CM

## 2017-04-03 DIAGNOSIS — C779 Secondary and unspecified malignant neoplasm of lymph node, unspecified: Secondary | ICD-10-CM

## 2017-04-04 ENCOUNTER — Encounter: Payer: 59 | Admitting: Physical Therapy

## 2017-04-04 ENCOUNTER — Telehealth: Payer: Self-pay | Admitting: *Deleted

## 2017-04-04 ENCOUNTER — Other Ambulatory Visit: Payer: Self-pay

## 2017-04-04 ENCOUNTER — Telehealth: Payer: Self-pay

## 2017-04-04 ENCOUNTER — Ambulatory Visit: Admit: 2017-04-04 | Payer: 59 | Admitting: General Surgery

## 2017-04-04 ENCOUNTER — Ambulatory Visit: Payer: 59

## 2017-04-04 SURGERY — MASTECTOMY, NIPPLE SPARING
Anesthesia: General | Site: Breast | Laterality: Right

## 2017-04-04 MED ORDER — ONDANSETRON HCL 8 MG PO TABS: 8.0000 mg | ORAL_TABLET | Freq: Three times a day (TID) | ORAL | 0 refills | Status: DC | PRN

## 2017-04-04 NOTE — Telephone Encounter (Signed)
I called and spoke to Ms. Jalloh today to check on her after she had cancelled radiation several days in a row. She reports to me that she has continued nausea with some vomiting over the past two days. She has been taking compazine without relief. She has been able to drink water and eat saltine crackers only over this time period. I offered to have a physician see her today in our office, but she preferred to call Dr. Virgie Dad office today. She will call Radiation Oncology today and inform us if she will be able to come in for her radiation appointment today.

## 2017-04-04 NOTE — Telephone Encounter (Signed)
Received vm from patient stating that she is having nausea and some vomiting for the last couple of days, causing her to miss some of her radiation treatments.  Pt stated in VM that compazine was not helping. Attempted call backs on her home phone as well as her cell phone. No answer on either. Messages left for pt to call back as soon as she could. Spoke with Thedore Mins, NP who saw her on Monday.  She recommended for her to take the lorazepam she has at home for nausea every 6 hours as needed.  Received call back from pt.  She states she had been talking to another nurse when I called.  Advised pt of trying the lorazepam and she voiced understanding.  Encouraged her to contiune fluids as tolerated and the saltines. Pt states she has been able to keep water down a little bit better this afternoon. Advised pt to call back in the morning if she was no better so that she could be seen by Ria Comment in the morning if needed. Pt verbalized understanding.

## 2017-04-04 NOTE — Progress Notes (Signed)
Called pt and returned vm regarding pt nausea medication. Pt states that she had been feeling nauseated for a few days now and is unable to really eat or drink. Pt states that she was prescribed compazine, but has no relief from the nausea. Denies any vomiting, fevers, chills, diarrhea or fatigue. Pt would like to request for a new nausea medication. Asked if pt would like to try zofran. Pt states that she had tried it before and had experienced a headache but would like to give it a try again. Told pt to monitor symptoms closely. Will send for zofran at [facility] today. Asked if pt would like to come in for IVF's, pt states that she would like to try out zofran and will encourage herself to eat/drink. Pt instructed to go to the emergency room if she is unable to eat/drink anything, decreased urine output, vomiting, and elevated temperature or diarrhea. Pt is at risk for dehydration and will need to follow up with her symptoms tomorrow. Pt verbalized understanding and will try zofran for now. Pt appreciative of call and has no further concerns at this time.

## 2017-04-05 ENCOUNTER — Ambulatory Visit
Admission: RE | Admit: 2017-04-05 | Discharge: 2017-04-05 | Disposition: A | Discharge status: Home / Self Care | Payer: 59 | Source: Ambulatory Visit | Attending: Radiation Oncology | Admitting: Radiation Oncology

## 2017-04-05 DIAGNOSIS — Z510 Encounter for antineoplastic radiation therapy: Secondary | ICD-10-CM | POA: Diagnosis not present

## 2017-04-08 ENCOUNTER — Ambulatory Visit
Admission: RE | Admit: 2017-04-08 | Discharge: 2017-04-08 | Disposition: A | Discharge status: Home / Self Care | Payer: 59 | Source: Ambulatory Visit | Attending: Radiation Oncology | Admitting: Radiation Oncology

## 2017-04-08 DIAGNOSIS — Z510 Encounter for antineoplastic radiation therapy: Secondary | ICD-10-CM | POA: Diagnosis not present

## 2017-04-09 ENCOUNTER — Ambulatory Visit
Admission: RE | Admit: 2017-04-09 | Discharge: 2017-04-09 | Disposition: A | Discharge status: Home / Self Care | Payer: 59 | Source: Ambulatory Visit | Attending: Radiation Oncology | Admitting: Radiation Oncology

## 2017-04-09 DIAGNOSIS — Z510 Encounter for antineoplastic radiation therapy: Secondary | ICD-10-CM | POA: Diagnosis not present

## 2017-04-10 ENCOUNTER — Other Ambulatory Visit: Payer: Self-pay

## 2017-04-10 ENCOUNTER — Ambulatory Visit
Admission: RE | Admit: 2017-04-10 | Discharge: 2017-04-10 | Disposition: A | Discharge status: Home / Self Care | Payer: 59 | Source: Ambulatory Visit | Attending: Radiation Oncology | Admitting: Radiation Oncology

## 2017-04-10 ENCOUNTER — Telehealth: Payer: Self-pay | Admitting: Oncology

## 2017-04-10 DIAGNOSIS — Z510 Encounter for antineoplastic radiation therapy: Secondary | ICD-10-CM | POA: Diagnosis not present

## 2017-04-10 MED ORDER — OXYCODONE HCL 15 MG PO TABS: 15.0000 mg | ORAL_TABLET | Freq: Three times a day (TID) | ORAL | 0 refills | Status: DC | PRN

## 2017-04-10 MED ORDER — OXYCODONE HCL ER 40 MG PO T12A: 80.0000 mg | EXTENDED_RELEASE_TABLET | Freq: Two times a day (BID) | ORAL | 0 refills | Status: DC

## 2017-04-10 NOTE — Telephone Encounter (Signed)
Faxed records to Dr. Rich Fuchs [phone]

## 2017-04-11 ENCOUNTER — Ambulatory Visit
Admission: RE | Admit: 2017-04-11 | Discharge: 2017-04-11 | Disposition: A | Discharge status: Home / Self Care | Payer: 59 | Source: Ambulatory Visit | Attending: Radiation Oncology | Admitting: Radiation Oncology

## 2017-04-11 DIAGNOSIS — Z510 Encounter for antineoplastic radiation therapy: Secondary | ICD-10-CM | POA: Diagnosis not present

## 2017-04-12 ENCOUNTER — Ambulatory Visit
Admission: RE | Admit: 2017-04-12 | Discharge: 2017-04-12 | Disposition: A | Discharge status: Home / Self Care | Payer: 59 | Source: Ambulatory Visit | Attending: Radiation Oncology | Admitting: Radiation Oncology

## 2017-04-12 DIAGNOSIS — Z510 Encounter for antineoplastic radiation therapy: Secondary | ICD-10-CM | POA: Diagnosis not present

## 2017-04-15 ENCOUNTER — Ambulatory Visit
Admission: RE | Admit: 2017-04-15 | Discharge: 2017-04-15 | Disposition: A | Discharge status: Home / Self Care | Payer: 59 | Source: Ambulatory Visit | Attending: Radiation Oncology | Admitting: Radiation Oncology

## 2017-04-15 DIAGNOSIS — Z510 Encounter for antineoplastic radiation therapy: Secondary | ICD-10-CM | POA: Diagnosis not present

## 2017-04-15 DIAGNOSIS — C7951 Secondary malignant neoplasm of bone: Secondary | ICD-10-CM

## 2017-04-15 MED ORDER — RADIAPLEXRX EX GEL
Freq: Once | CUTANEOUS | Status: AC
  Administered 2017-04-15: 16:00:00 via TOPICAL

## 2017-04-15 MED FILL — VERZENIO 100 MG TAB: 100 | 28 days supply | Qty: 56 | Fill #2

## 2017-04-16 ENCOUNTER — Ambulatory Visit
Admission: RE | Admit: 2017-04-16 | Discharge: 2017-04-16 | Disposition: A | Discharge status: Home / Self Care | Payer: 59 | Source: Ambulatory Visit | Attending: Radiation Oncology | Admitting: Radiation Oncology

## 2017-04-16 ENCOUNTER — Ambulatory Visit (HOSPITAL_BASED_OUTPATIENT_CLINIC_OR_DEPARTMENT_OTHER): Payer: 59 | Admitting: Oncology

## 2017-04-16 ENCOUNTER — Other Ambulatory Visit (HOSPITAL_BASED_OUTPATIENT_CLINIC_OR_DEPARTMENT_OTHER): Payer: 59

## 2017-04-16 VITALS — BP 129/69 | HR 85 | Temp 98.8°F | Ht 62.0 in | Wt 152.6 lb

## 2017-04-16 DIAGNOSIS — C50912 Malignant neoplasm of unspecified site of left female breast: Secondary | ICD-10-CM

## 2017-04-16 DIAGNOSIS — C773 Secondary and unspecified malignant neoplasm of axilla and upper limb lymph nodes: Secondary | ICD-10-CM

## 2017-04-16 DIAGNOSIS — Z510 Encounter for antineoplastic radiation therapy: Secondary | ICD-10-CM | POA: Diagnosis not present

## 2017-04-16 DIAGNOSIS — Z170 Estrogen receptor positive status [ER+]: Secondary | ICD-10-CM | POA: Diagnosis not present

## 2017-04-16 DIAGNOSIS — C7951 Secondary malignant neoplasm of bone: Secondary | ICD-10-CM

## 2017-04-16 DIAGNOSIS — C50412 Malignant neoplasm of upper-outer quadrant of left female breast: Secondary | ICD-10-CM

## 2017-04-16 DIAGNOSIS — Z79811 Long term (current) use of aromatase inhibitors: Secondary | ICD-10-CM | POA: Diagnosis not present

## 2017-04-16 DIAGNOSIS — C779 Secondary and unspecified malignant neoplasm of lymph node, unspecified: Principal | ICD-10-CM

## 2017-04-16 DIAGNOSIS — Z7189 Other specified counseling: Secondary | ICD-10-CM

## 2017-04-16 LAB — CBC WITH DIFFERENTIAL/PLATELET
BASO%: 1.5 % (ref 0.0–2.0)
BASOS ABS: 0 10*3/uL (ref 0.0–0.1)
EOS ABS: 0.1 10*3/uL (ref 0.0–0.5)
EOS%: 3.2 % (ref 0.0–7.0)
HCT: 31.3 % — ABNORMAL LOW (ref 34.8–46.6)
HEMOGLOBIN: 10.5 g/dL — AB (ref 11.6–15.9)
LYMPH%: 15.1 % (ref 14.0–49.7)
MCH: 34 pg (ref 25.1–34.0)
MCHC: 33.4 g/dL (ref 31.5–36.0)
MCV: 101.7 fL — AB (ref 79.5–101.0)
MONO#: 0.2 10*3/uL (ref 0.1–0.9)
MONO%: 8.9 % (ref 0.0–14.0)
NEUT%: 71.3 % (ref 38.4–76.8)
NEUTROS ABS: 1.5 10*3/uL (ref 1.5–6.5)
PLATELETS: 196 10*3/uL (ref 145–400)
RBC: 3.08 10*6/uL — ABNORMAL LOW (ref 3.70–5.45)
RDW: 18.2 % — AB (ref 11.2–14.5)
WBC: 2.1 10*3/uL — AB (ref 3.9–10.3)
lymph#: 0.3 10*3/uL — ABNORMAL LOW (ref 0.9–3.3)

## 2017-04-16 MED ORDER — ONDANSETRON HCL 8 MG PO TABS: 8.0000 mg | ORAL_TABLET | Freq: Three times a day (TID) | ORAL | 0 refills | Status: DC | PRN

## 2017-04-16 MED ORDER — OXYCODONE HCL ER 80 MG PO T12A: 80.0000 mg | EXTENDED_RELEASE_TABLET | Freq: Three times a day (TID) | ORAL | 0 refills | Status: DC

## 2017-04-16 MED ORDER — OXYCODONE HCL 15 MG PO TABS: 15.0000 mg | ORAL_TABLET | ORAL | 0 refills | Status: DC | PRN

## 2017-04-16 MED ORDER — OXYCODONE HCL 15 MG PO TABS: 15.0000 mg | ORAL_TABLET | Freq: Three times a day (TID) | ORAL | 0 refills | Status: DC | PRN

## 2017-04-16 NOTE — Progress Notes (Signed)
[place]  [phone] [phone]) [phone]   ID: Alison Alison [name] DOB: 1960/12/04  MR#: [id number]  KVQ#:[id number]  Patient Care Team: [name], MD as PCP - General (Internal Medicine) Erroll Luna, MD as Consulting Physician (General Surgery) Shontell Prosser, Virgie Dad, MD as Consulting Physician (Oncology) Aloha Gell, MD as Consulting Physician (Obstetrics and Gynecology) Harriett Sine, MD as Consulting Physician (Dermatology) Benson Norway, RN as Registered Nurse (Oncology) Eppie Gibson, MD as Attending Physician (Radiation Oncology) PCP: [name], MD OTHER MD:  CHIEF COMPLAINT: Estrogen receptor positive breast cancer  CURRENT TREATMENT: Anastrozole, fulvestrant, abemaciclib, denosumab/Xgeva  INTERVAL HISTORY: Alison Alison [name] returns today for follow-up and treatment of her estrogen receptor positive metastatic breast cancer. She is currently receiving palliative radiation to the spine. So far she has not noted any significant change in back pain and continues to use significant amounts of narcotics. Specifically she has been on 80 mg of OxyContin twice daily and is taking 30 mg of oxycodone 5 or 6 times a day. Despite this she is having regular bowel movements which are not Alison [name].  She is tolerating the Lexapro with no side effects that she is aware of. She is also on abemaciclib twice daily. She is having a little bit of bloating which may be related to that medication. She also has nausea, but she relates that to having to come here for the radiation treatments. She takes Zofran and Compazine and lorazepam before each radiation dose and that seems to be taking care of that problem.  She tolerates the denosumab/Xgeva and fulvestrant without any complications.  REVIEW OF SYSTEMS:  Alison [name]'s mood is much improved since she started Lexapro. She feels more competent, is doing housework, is not crying all the time, and has a more positive  attitude. There have been no unusual headaches, visual changes, nausea, vomiting, dizziness, or gait imbalance. There has been no cough phlegm production or pleurisy. A detailed review of systems today was otherwise stable  BREAST CANCER HISTORY: From the original intake note:  Alison Alison [name] had routine screening mammography with tomography at [facility] February 20 11/14/2014 showing a possible mass in the right breast. Diagnostic right mammography with right breast ultrasonography 11/01/2014 found the breast density to be category C. In the upper outer quadrant of the right breast there was a partially obscured mass which on physical exam was palpable as an area of thickening at the 10:00 position 8 cm from the nipple. Ultrasound showed multiple cysts in the upper outer right breast corresponding to the mass in question.  In November 2016 the patient felt a change in the upper left breast and brought it to her primary care physician's attention. Diagnostic left mammography with tomosynthesis and left breast ultrasonography at the [facility] December 01/14/2015 found trabecular thickening throughout the left breast with skin thickening, but no circumscribed mass or suspicious calcifications. A rounded mass in the left axilla measured 6 mm. There was an additional mildly prominent lymph node in the left axilla which was what the patient had been palpating. Ultrasonography showed ill-defined swelling throughout the inner left breast with overlying skin thickening.. At the 2:00 position 4 cm from the nipple there was an irregular hypoechoic mass measuring 2.3 cm. A second mass at the 1:00 area 3 cm from the nipple measured 1 cm. The distance between these 2 masses was 1.8 cm. There was also a round hypoechoic left axillary mass measuring 0.6 cm. Overall the was an area of 3.7 cm of  mixed echogenicity corresponding to the area of palpable soft tissue swelling.   On 08/04/2015 the patient underwent biopsy of  the mass at the 2:00 axis 4 cm from the nipple and a second biopsy was performed of the hypoechoic mass at the 1:00 position 3 cm from the nipple. Biopsy of the suspicious nodule in the lower left axilla was attempted but could not be completed as the mass became obscured after administration of lidocaine.  The pathology from both left breast biopsies 08/04/2015 ([id number]) showed invasive ductal carcinoma, grade 3. The prognostic profile obtained from the larger tumor showed estrogen receptor to be strongly positive at 95%, progesterone receptor positive at 30%, with an MIB-1 of 12%, and no HER-2 amplification, the signals ratio being 1.22 and the number per cell 2.20.  On 08/09/2015 the patient underwent biopsy of this suspicious left axillary lymph node noted above, and this showed ([id number]) metastatic carcinoma with extracapsular extension.  The patient's subsequent history is as detailed below    PAST MEDICAL HISTORY: Past Medical History:  Diagnosis Date  . Anxiety   . Asthma     seasonal asthma  . Breast cancer ([place])    metastatic breast cancer  . Complication of anesthesia 2007   aspiration pneumonia post hysterectomy.  Blood pressure would drop low- but no problems 01/2016- surgery  . History of blood transfusion   . History of mitral valve prolapse 1988   with palpitations  . History of radiation therapy 04/02/16- 05/21/16   Left Chest Wall, SCV, Axilla, IM nodes  . Hypertension    no meds  . Neuropathy    with chemo  . Osteopenia determined by x-ray   . Pneumonia    1980's and 1990's  . Raynaud's disease   . 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 Skin cancer 03/12/2014   basal- back    PAST SURGICAL HISTORY: Past Surgical History:  Procedure Laterality Date  . ABDOMINAL HYSTERECTOMY  2007   with BSO  . APPENDECTOMY  2007  . AXILLARY LYMPH NODE DISSECTION Left 02/14/2016   Procedure: LEFT AXILLARY LYMPH NODE DISSECTION;   Surgeon: [name], MD;  Location: [facility];  Service: General;  Laterality: Left;  . BREAST RECONSTRUCTION WITH PLACEMENT OF TISSUE EXPANDER AND FLEX HD (ACELLULAR HYDRATED DERMIS) Left 02/02/2016   Procedure: IMMEDIATE LEFT BREAST RECONSTRUCTION WITH PLACEMENT OF TISSUE EXPANDER AND FLEX HD (ACELLULAR HYDRATED DERMIS);  Surgeon: [name], DO;  Location: [facility];  Service: Plastics;  Laterality: Left;  . BREAST REDUCTION SURGERY Right 03/28/2017   Procedure: RIGHT MAMMARY REDUCTION  (BREAST);  Surgeon: [name], DO;  Location: [facility];  Service: Plastics;  Laterality: Right;  . BREAST SURGERY Left 02/02/16   mastectomy with reconstruction  . c-section  1991  . COLON RESECTION SIGMOID  12/02/2012   [facility]   . HERNIA REPAIR  2007  . LAPAROSCOPIC ENDOMETRIOSIS FULGURATION  1984, 1989  . MASTOPEXY Right 03/28/2017   Procedure: MASTOPEXY;  Surgeon: [name], DO;  Location: [facility];  Service: Plastics;  Laterality: Right;  . Port- a- cath placement  07/2015  . PORT-A-CATH REMOVAL Right 02/02/2016   Procedure: REMOVAL PORT-A-CATH;  Surgeon: [name], MD;  Location: [facility];  Service: General;  Laterality: Right;  . RADIOACTIVE SEED GUIDED AXILLARY SENTINEL LYMPH NODE Left 02/02/2016   Procedure: LEFT BREAST MASTECTOMY AND RADIOACTIVE SEED GUIDED LEFT SENTINEL LYMPH NODE BIOPSY ;  Surgeon: [name], MD;  Location: [facility];  Service: General;  Laterality: Left;  . REMOVAL OF TISSUE EXPANDER AND PLACEMENT OF IMPLANT Left 03/28/2017   Procedure: REMOVAL OF TISSUE EXPANDER AND PLACEMENT OF IMPLANT;  Surgeon: [name], DO;  Location: [facility];  Service: Plastics;  Laterality: Left;    FAMILY HISTORY Family History  Problem Relation Age of Onset  . Lung cancer Father    the patient's father died from lung cancer at the age of 73 in the setting of tobacco  abuse. The patient's mother died at the age of 42 with pancreatic cancer. The patient had no siblings. There is no history of breast or ovarian cancer in the family.  GYNECOLOGIC HISTORY:  No LMP recorded. Patient has had a hysterectomy. Menarche age 39, first live birth age 28. The patient is GX P1. She is status post total hysterectomy with bilateral salpingo-oophorectomy. She has been on hormone replacement since that time and is tapering to off at the time of this dictation.   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 She used to work at [employer]. Her husband [name] works for [employer] division at a [employer] their son Alison Alison [name] is currently at home.    ADVANCED DIRECTIVES: Not in place   HEALTH MAINTENANCE: Social History  Substance Use Topics  . Smoking status: Former Smoker    Packs/day: 1.00    Years: 10.00    Types: Cigarettes    Quit date: 11/25/2008  . Smokeless tobacco: Never Used     Comment: on and off smoker never consistent  . Alcohol use Yes     Comment: social     Colonoscopy: April 2014/ Raynaldo Opitz at [facility]  PAP: s/p hysterectomy  Bone density: August 2016/ osteopenia  Lipid panel:  Allergies  Allergen Reactions  . Sulfa Antibiotics Other (See Comments)    Blisters in mouth  . Zofran [Ondansetron Hcl] Other (See Comments)    Severe Headaches     Current Outpatient Prescriptions  Medication Sig Dispense Refill  . Abemaciclib 100 MG TABS Take 100 mg by mouth 2 (two) times daily. 60 tablet 3  . anastrozole (ARIMIDEX) 1 MG tablet Take 1 mg by mouth daily.    . Ascorbic Acid (VITAMIN C) 1000 MG tablet Take 1,000 mg by mouth 2 (two) times daily.    . cetirizine (ZYRTEC) 10 MG tablet Take 10 mg by mouth daily.    . Cholecalciferol (VITAMIN D3) 2000 UNITS capsule Take 2,000 Units by mouth daily.     Marland Kitchen escitalopram (LEXAPRO) 10 MG tablet Take 1 tablet (10 mg total) by mouth daily. 30 tablet 12  . fluticasone (FLONASE) 50 MCG/ACT  nasal spray Place 2 sprays into both nostrils daily. Reported on 11/18/2015    . gabapentin (NEURONTIN) 300 MG capsule Take 1 capsule (300 mg total) by mouth at bedtime as needed. Take 2 tablets in AM, 2 tablets mid-day, and 4 tablets at bedtime 90 capsule 4  . LORazepam (ATIVAN) 0.5 MG tablet Take 1 tablet (0.5 mg total) by mouth at bedtime. 30 tablet 3  . Multiple Vitamin (MULTIVITAMIN) tablet Take 1 tablet by mouth daily.    . ondansetron (ZOFRAN) 8 MG tablet Take 1 tablet (8 mg total) by mouth every 8 (eight) hours as needed for nausea or vomiting. 20 tablet 0  . oxyCODONE (OXYCONTIN) 40 mg 12 hr tablet Take 2 tablets (80 mg total) by mouth 2 (two) times daily.  60 tablet 0  . oxyCODONE (ROXICODONE) 15 MG immediate release tablet Take 1 tablet (15 mg total) by mouth every 8 (eight) hours as needed for pain. 90 tablet 0  . Probiotic Product (PROBIOTIC DAILY PO) Take 1 capsule by mouth daily. [place] prochlorperazine (COMPAZINE) 10 MG tablet Take 1 tablet (10 mg total) by mouth every 6 (six) hours as needed (Nausea or vomiting). 30 tablet 2  . traZODone (DESYREL) 50 MG tablet Take 3 tablets (150 mg total) by mouth at bedtime. 90 tablet 0  . zolpidem (AMBIEN) 5 MG tablet Take 1 tablet (5 mg total) by mouth at bedtime as needed for sleep. 30 tablet 0   No current facility-administered medications for this visit.     OBJECTIVE: Middle-aged white womanWho appears stated age  13:   04/16/17 1634  BP: 129/69  Pulse: 85  Temp: 98.8 F (37.1 C)  SpO2: 94%     Body mass index is 27.91 kg/m.    ECOG FS:2 - Symptomatic, <50% confined to bed  Sclerae unicteric, pupils round and equal Oropharynx clear and moist No cervical or supraclavicular adenopathy Lungs no rales or rhonchi Heart regular rate and rhythm Abd soft, nontender, positive bowel sounds MSK no focal spinal tenderness, no upper extremity lymphedema Neuro: nonfocal, well oriented,  appropriate affect Breasts: The right breast is status post reduction mammoplasty. The left breast is status post mastectomy and the final implant is now in place. There is no evidence of local recurrence.    LAB RESULTS:  CMP     Component Value Date/Time   NA 142 04/01/2017 1359   K 4.0 04/01/2017 1359   CL 110 02/15/2016 0330   CO2 22 04/01/2017 1359   GLUCOSE 135 04/01/2017 1359   BUN 5.8 (L) 04/01/2017 1359   CREATININE 0.8 04/01/2017 1359   CALCIUM 8.4 04/01/2017 1359   PROT 7.0 04/01/2017 1359   ALBUMIN 3.6 04/01/2017 1359   AST 22 04/01/2017 1359   ALT 14 04/01/2017 1359   ALKPHOS 111 04/01/2017 1359   BILITOT 0.52 04/01/2017 1359   GFRNONAA >60 02/15/2016 0330   GFRAA >60 02/15/2016 0330    INo results found for: SPEP, UPEP  Lab Results  Component Value Date   WBC 2.1 (L) 04/16/2017   NEUTROABS 1.5 04/16/2017   HGB 10.5 (L) 04/16/2017   HCT 31.3 (L) 04/16/2017   MCV 101.7 (H) 04/16/2017   PLT 196 04/16/2017      Chemistry      Component Value Date/Time   NA 142 04/01/2017 1359   K 4.0 04/01/2017 1359   CL 110 02/15/2016 0330   CO2 22 04/01/2017 1359   BUN 5.8 (L) 04/01/2017 1359   CREATININE 0.8 04/01/2017 1359      Component Value Date/Time   CALCIUM 8.4 04/01/2017 1359   ALKPHOS 111 04/01/2017 1359   AST 22 04/01/2017 1359   ALT 14 04/01/2017 1359   BILITOT 0.52 04/01/2017 1359       No results found for: LABCA2  No components found for: LABCA125  No results for input(s): INR in the last 168 hours.  Urinalysis    Component Value Date/Time   COLORURINE YELLOW 11/12/2015 [place] 11/12/2015 0837   LABSPEC 1.005 03/18/2017 1223   PHURINE 6.0 03/18/2017 1223   PHURINE 6.5 11/12/2015 0837   GLUCOSEU Negative 03/18/2017 1223   HGBUR Large 03/18/2017 1223   HGBUR MODERATE (A) 11/12/2015  [place] Negative 03/18/2017 1223   KETONESUR Negative 03/18/2017 1223   [place] 11/12/2015 0837   PROTEINUR  Negative 03/18/2017 1223   PROTEINUR NEGATIVE 11/12/2015 0837   UROBILINOGEN 0.2 03/18/2017 1223   NITRITE Negative 03/18/2017 1223   NITRITE NEGATIVE 11/12/2015 0837   LEUKOCYTESUR Moderate 03/18/2017 1223    STUDIES: No results found.   PROTOCOLS: BWEL, ABC and PALLAS, [place] study  ASSESSMENT: 56 y.o. [place] woman status post biopsy of 2 separate masses in the left breast upper outer quadrant 08/04/2015, clinically mT2 N1, stage IIb/IIIa invasive ductal carcinoma, grade 3, the larger mass being estrogen and progesterone receptor positive, HER-2 negative, with an MIB-1 of 12%  (a) biopsy of a left axillary lymph node 08/09/2015 was positive, with extracapsular extension  (1) neoadjuvant chemotherapy started 08/25/2015 consisting of doxorubicin and cyclophosphamide in dose dense fashion 4, completed 10/06/2015,  followed by weekly paclitaxel 10 completed 12/23/2015  (a) final 2 planned cycles of weekly paclitaxel omitted because of neuropathy concerns  (2) status post left mastectomy with targeted axillary dissection 02/02/2016 for a ypT2 ypN2a, stage IIIa invasive ductal carcinoma, grade 2, with negative margins, repeat prognostic panel showing estrogen receptor positive, but progesterone receptor and HER-2 negative  (a) completion axillary lymph node dissection 02/14/2016 showed an additional 2 out of 8 lymph nodes to be involved (final count 6 out of 10 lymph nodes positive  (b) expander placement at the time of left mastectomy  (3) adjuvant capecitabine starting concurrently with radiation--discontinued 05/21/2016  (4) adjuvant  radiation started 04/02/2016, completed 05/21/2016  (5) anastrozole started October 2017  (a) bone density August 2016 showed osteopenia  (b) repeat bone density 09/07/2016 shows a T score of -1.7.  (c) the patient is status post total abdominal hysterectomy with bilateral salpingo-oophorectomy.  (6) postoperative pain?: Starting OxyContin 20 mg  twice a day with oxycodone as needed for breakthrough pain 03/02/2016  (a) failed transition to tramadol, gabapentin, naproxen   (b) started methadone 5 mg TID as of 12/20/2016-- not tolerated  (c) robaxin added June 2018 to NSAIDS and tylenol  (7) Research:  (a) B-WEL study ([facility] [id number])--enrolled in Arm 1 (received education)  (b) PALLAS AFT-05 study: randomized to treatment, started palbociclib 07/26/2016   (i) dose reduced to 100 mg, then to 75 mg because of cytopenias   (ii) went off study study May 2018 because of persistent cytopenias  (c) taxane study 12/20/2016  (d) aspirin study  METASTATIC DISEASE 02/15/2017: CT scan of the lumbar spine and 02/27/2017 CT of the thoracic and cervical spine shows multiple bone lesions but no evidence of cord compromise; bone lesions are confirmed on PET scan 02/22/2017 which however shows no evidence of visceral disease  (a) CA-27-29 is noninformative  (8) continuing anastrozole, abemaciclib added 02/22/2017, fulvestrant started 02/22/2017 ,   (9) yearly zolendronate given 09/26/2016, changed to monthly denosumab/Xgeva starting 03/19/2017  (10) radiation to be completed 04/23/2017  Other issues: (a) poorly controlled pain: Currently on OxyContin 80 mg po TID/ oxycodone 15-30 mg Q 4h PRN  (i) bowel prophylaxis in place  (b) left upper lobe changes noted on PET scan 02/22/2017 felt to be secondary to radiation  (c) MDAnderson review results pending    PLAN: Suman is doing a lot better on Lexapro. Her mood is noticeably improved and her pain control is improved as well.  She has been taking approximately 152 180 mg of short acting oxycodone daily in addition to 120 mg of OxyContin. She is able  to function fairly well with that. Accordingly I increase the OxyContin to 80 mg 3 times a day and let her continue to use the oxycodone on an as-needed basis and she will keep tabs and we will keep moving the short acting milligrams to the  long-acting side until she is on a stable dose of with minimal breakthrough pain  She has a very successful bowel prophylaxis program in place and has not had any "Alison [name]" bowel movements.  She is managing the nausea that she experiences with radiation, which is think is partly psychogenic, with lorazepam as well as anti-emetics. She should do well once she completes her radiation treatments 04/23/2017  She is having a little bit of bloating which I think is going to be related to the abemaciclib. Hopefully the nausea will not be related to that. In any case her counts are holding up well. The plan is to continue that together with the anti-estrogens and denosumab Andria Frames she tolerates them well and there is no evidence of disease progression  She will see [place] again in September 25 chiefly to titrate pain medicine and to make sure she is recovering from radiation. She will then see me again with the October denosumab dose. She will have a chest x-ray and bone scan shortly before that visit. That will serve as our new baseline.  She knows to call for any other issues that may develop before the next visit here.   :Chauncey Cruel, MD   04/16/2017 4:56 PM

## 2017-04-17 ENCOUNTER — Telehealth: Payer: Self-pay

## 2017-04-17 ENCOUNTER — Ambulatory Visit: Payer: 59 | Attending: General Surgery | Admitting: Physical Therapy

## 2017-04-17 ENCOUNTER — Ambulatory Visit
Admission: RE | Admit: 2017-04-17 | Discharge: 2017-04-17 | Disposition: A | Discharge status: Home / Self Care | Payer: 59 | Source: Ambulatory Visit | Attending: Radiation Oncology | Admitting: Radiation Oncology

## 2017-04-17 DIAGNOSIS — M25612 Stiffness of left shoulder, not elsewhere classified: Secondary | ICD-10-CM

## 2017-04-17 DIAGNOSIS — G8929 Other chronic pain: Secondary | ICD-10-CM | POA: Diagnosis present

## 2017-04-17 DIAGNOSIS — R293 Abnormal posture: Secondary | ICD-10-CM | POA: Diagnosis not present

## 2017-04-17 DIAGNOSIS — M5440 Lumbago with sciatica, unspecified side: Secondary | ICD-10-CM | POA: Diagnosis present

## 2017-04-17 DIAGNOSIS — Z510 Encounter for antineoplastic radiation therapy: Secondary | ICD-10-CM | POA: Diagnosis not present

## 2017-04-17 DIAGNOSIS — M25512 Pain in left shoulder: Secondary | ICD-10-CM | POA: Diagnosis present

## 2017-04-17 DIAGNOSIS — I890 Lymphedema, not elsewhere classified: Secondary | ICD-10-CM | POA: Insufficient documentation

## 2017-04-17 NOTE — Telephone Encounter (Signed)
Follow up appts made per los...research patient.  I placed a note for her to get a new schedule today at her rad onc appt   Alison [name]

## 2017-04-18 ENCOUNTER — Other Ambulatory Visit: Payer: Self-pay | Admitting: Oncology

## 2017-04-18 ENCOUNTER — Ambulatory Visit
Admission: RE | Admit: 2017-04-18 | Discharge: 2017-04-18 | Disposition: A | Discharge status: Home / Self Care | Payer: 59 | Source: Ambulatory Visit | Attending: Radiation Oncology | Admitting: Radiation Oncology

## 2017-04-18 ENCOUNTER — Ambulatory Visit: Payer: 59 | Admitting: Physical Therapy

## 2017-04-18 ENCOUNTER — Ambulatory Visit: Payer: 59

## 2017-04-18 DIAGNOSIS — M5440 Lumbago with sciatica, unspecified side: Secondary | ICD-10-CM

## 2017-04-18 DIAGNOSIS — R293 Abnormal posture: Secondary | ICD-10-CM | POA: Diagnosis not present

## 2017-04-18 DIAGNOSIS — M25612 Stiffness of left shoulder, not elsewhere classified: Secondary | ICD-10-CM

## 2017-04-18 DIAGNOSIS — G8929 Other chronic pain: Secondary | ICD-10-CM

## 2017-04-18 DIAGNOSIS — I890 Lymphedema, not elsewhere classified: Secondary | ICD-10-CM

## 2017-04-18 DIAGNOSIS — Z510 Encounter for antineoplastic radiation therapy: Secondary | ICD-10-CM | POA: Diagnosis not present

## 2017-04-18 DIAGNOSIS — M25512 Pain in left shoulder: Secondary | ICD-10-CM

## 2017-04-18 NOTE — Therapy (Signed)
[place], [place], [address] Phone: [phone]   Fax:  [phone]  Physical Therapy Treatment  Patient Details  Name: Alison [name] MRN: [id number] Date of Birth: 06-Feb-1961 Referring Provider: Dr. [name]  Encounter Date: 04/18/2017      PT End of Session - 04/18/17 1646    Visit Number 31   Number of Visits 55   Date for PT Re-Evaluation 06/26/17   PT Start Time 1600   PT Stop Time 6962   PT Time Calculation (min) 43 min   Activity Tolerance Patient tolerated treatment well      Past Medical History:  Diagnosis Date  . Anxiety   . Asthma     seasonal asthma  . Breast cancer ([place])    metastatic breast cancer  . Complication of anesthesia 2007   aspiration pneumonia post hysterectomy.  Blood pressure would drop low- but no problems 01/2016- surgery  . History of blood transfusion   . History of mitral valve prolapse 1988   with palpitations  . History of radiation therapy 04/02/16- 05/21/16   Left Chest Wall, SCV, Axilla, IM nodes  . Hypertension    no meds  . Neuropathy    with chemo  . Osteopenia determined by x-ray   . Pneumonia    1980's and 1990's  . Raynaud's disease   . 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 Skin cancer 03/12/2014   basal- back    Past Surgical History:  Procedure Laterality Date  . ABDOMINAL HYSTERECTOMY  2007   with BSO  . APPENDECTOMY  2007  . AXILLARY LYMPH NODE DISSECTION Left 02/14/2016   Procedure: LEFT AXILLARY LYMPH NODE DISSECTION;  Surgeon: [name], MD;  Location: [facility];  Service: General;  Laterality: Left;  . BREAST RECONSTRUCTION WITH PLACEMENT OF TISSUE EXPANDER AND FLEX HD (ACELLULAR HYDRATED DERMIS) Left 02/02/2016   Procedure: IMMEDIATE LEFT BREAST RECONSTRUCTION WITH PLACEMENT OF TISSUE EXPANDER AND FLEX HD (ACELLULAR HYDRATED DERMIS);  Surgeon: [name], DO;  Location: [facility];  Service: Plastics;  Laterality: Left;  . BREAST REDUCTION SURGERY Right 03/28/2017   Procedure: RIGHT MAMMARY REDUCTION  (BREAST);  Surgeon: [name], DO;  Location: [facility];  Service: Plastics;  Laterality: Right;  . BREAST SURGERY Left 02/02/16   mastectomy with reconstruction  . c-section  1991  . COLON RESECTION SIGMOID  12/02/2012   [facility]   . HERNIA REPAIR  2007  . LAPAROSCOPIC ENDOMETRIOSIS FULGURATION  1984, 1989  . MASTOPEXY Right 03/28/2017   Procedure: MASTOPEXY;  Surgeon: [name], DO;  Location: [facility];  Service: Plastics;  Laterality: Right;  . Port- a- cath placement  07/2015  . PORT-A-CATH REMOVAL Right 02/02/2016   Procedure: REMOVAL PORT-A-CATH;  Surgeon: [name], MD;  Location: [facility];  Service: General;  Laterality: Right;  . RADIOACTIVE SEED GUIDED AXILLARY SENTINEL LYMPH NODE Left 02/02/2016   Procedure: LEFT BREAST MASTECTOMY AND RADIOACTIVE SEED GUIDED LEFT SENTINEL LYMPH NODE BIOPSY ;  Surgeon: [name], MD;  Location: [facility];  Service: General;  Laterality: Left;  . REMOVAL OF TISSUE EXPANDER AND PLACEMENT OF IMPLANT Left 03/28/2017   Procedure: REMOVAL OF TISSUE EXPANDER AND PLACEMENT OF IMPLANT;  Surgeon: [name], DO;  Location: [facility];  Service: Plastics;  Laterality: Left;    There were no vitals filed for this visit.  Subjective Assessment - 04/18/17 1605    Subjective Pt states she is tired today  She had radiation and that went okay.  She is taking her medicine as recommended and that seems to be helping.  she has not found her sleeve yet, but will look later.    Pertinent History Breast cancer diagnosed November2016 She had chemotherapy. but did have some neuropathy so it had to be stopped. On June 8, she had a mastectomy with immedicate expander placement and with first set of lymphnodes removed, On June 20 she went  for complete axillary lymph node dissection. She says she has had always been "full necked" but has neck and upper body fullness since the cancer was diagnosed. She reports she has osteopenia in low back and has had a fracture that was discovered in 2014 when she had a Dexa scan.   Patient Stated Goals to decrease the pain    Currently in Pain? Yes   Pain Score 4    Pain Location Back               LYMPHEDEMA/ONCOLOGY QUESTIONNAIRE - 04/17/17 1130      Type   Cancer Type left breast      What other symptoms do you have   Are you Having Heaviness or Tightness Yes   Are you having Pain Yes   Are you having pitting edema Yes   Body Site left forearm   Is it Hard or Difficult finding clothes that fit Yes   Do you have infections No   Stemmer Sign No     Right Upper Extremity Lymphedema   15 cm Proximal to Olecranon Process 29.5 cm   10 cm Proximal to Olecranon Process 25.6 cm   Olecranon Process 23 cm   15 cm Proximal to Ulnar Styloid Process 22.5 cm   10 cm Proximal to Ulnar Styloid Process 20.5 cm   Just Proximal to Ulnar Styloid Process 15 cm   Across Hand at [employer] 19 cm   At [place] of 2nd Digit 5.5 cm     Left Upper Extremity Lymphedema   15 cm Proximal to Olecranon Process 32 cm   10 cm Proximal to Olecranon Process 28 cm   Olecranon Process 25 cm   15 cm Proximal to Ulnar Styloid Process 25 cm   10 cm Proximal to Ulnar Styloid Process 22.3 cm   Just Proximal to Ulnar Styloid Process 15.5 cm   Across Hand at [employer] 18.4 cm   At [place] of 2nd Digit 5.7 cm                  [facility] Adult PT Treatment/Exercise - 04/18/17 0001      Manual Therapy   Edema Management provided small tg soft from wirst to deep fold above elbow    Manual Lymphatic Drainage (MLD) in supine short neck, superficial and deep abdominals, right axillary nodes, anterior interaxillary anastamosis, left arm to hand and return with extra time spent on forearm then to right  sidelying for posterior interaxillary anastamosis. and additional work, on entire left arm                    Short Term Clinic Goals - 04/18/17 1005      CC Short Term Goal  #1   Title Pt will report she knows strategies for good body mechanics to decrease low back pain    Time 4   Period Weeks  CC Short Term Goal  #2   Title Pt will have a deacrease in circumference of left forearm by 1 cm    Baseline 25 cm    Time 4   Period Weeks   Status New     CC Short Term Goal  #3   Title Pt will report she knows how to manage left UE lymphedema with use of compression, elevation and exercise, self manual lymph drainage and possibly Flexitouch    Baseline 4   Period Weeks   Status New             Long Term Clinic Goals - 04/18/17 1004      CC Long Term Goal  #1   Title Patient will report  that she can manage back pain  so she can perform daily activities with greater ease   Time 8   Period Weeks   Status New     CC Long Term Goal  #2   Title Pt will  have given the TENS unit a trial to see if it will help with back pain    Time 8   Period Weeks   Status New     CC Long Term Goal  #3   Title Pt will be independent in core and  strengthening exercises    Time 8   Period Weeks   Status New            Plan - 04/18/17 1647    Clinical Impression Statement Pitting edema present in left upper arm and forearm that had some, but minimal improvement with MLD, Added tg soft for mild compression. Pt reported symptomatic relief with treatment    Clinical Impairments Affecting Rehab Potential chemotherapy with neuropathy , radiation to back    PT Next Visit Plan manual lymph draiange to left arm , check compression sleeve, use TENS for pain managment, add condiitioning exercise contact Flexiotouch to see if Jacynda can get arm sleeve to use with her head and neck unit.    Consulted and Agree with Plan of Care Patient      Patient will benefit from skilled  therapeutic intervention in order to improve the following deficits and impairments:  Decreased range of motion, Impaired UE functional use, Decreased activity tolerance, Decreased knowledge of precautions, Decreased skin integrity, Impaired perceived functional ability, Pain, Decreased knowledge of use of DME, Increased edema, Postural dysfunction, Decreased strength, Impaired sensation, Decreased scar mobility, Decreased mobility, Increased muscle spasms  Visit Diagnosis: Abnormal posture  Lymphedema, not elsewhere classified  Stiffness of left shoulder joint  Low back pain with sciatica, sciatica laterality unspecified, unspecified back pain laterality, unspecified chronicity  Chronic left shoulder pain     Problem List Patient Active Problem List   Diagnosis Date Noted  . Counseling regarding goals of care 04/16/2017  . Bone metastases ([place]) 02/28/2017  . Uncontrolled pain 02/28/2017  . Osteopenia determined by x-ray 09/20/2016  . Primary malignant neoplasm of left breast with stage 2 nodal metastasis per American Joint Committee on Cancer 7th edition (N2) ([place]) 02/14/2016  . 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 Insomnia 09/01/2015  . Malignant neoplasm of upper-outer quadrant of left breast in female, estrogen receptor positive ([place]) 08/08/2015   Donato Heinz. Owens Shark PT   Norwood Levo 04/18/2017, 4:50 PM  [place], [place], [address] Phone: [phone]   Fax:  [phone]  Name: Alison [name] MRN: [id number] Date of Birth: 07-26-1961

## 2017-04-18 NOTE — Therapy (Signed)
[place], [place], [address] Phone: [phone]   Fax:  [phone]  Physical Therapy Treatment  Patient Details  Name: Alison [name] MRN: [id number] Date of Birth: 15-Mar-1961 Referring Provider: Dr. [name]  Encounter Date: 04/17/2017      PT End of Session - 04/18/17 0955    Visit Number 30   PT Start Time 2595   PT Stop Time 1150   PT Time Calculation (min) 45 min   Activity Tolerance Patient tolerated treatment well   Behavior During Therapy [facility] for tasks assessed/performed      Past Medical History:  Diagnosis Date  . Anxiety   . Asthma     seasonal asthma  . Breast cancer ([place])    metastatic breast cancer  . Complication of anesthesia 2007   aspiration pneumonia post hysterectomy.  Blood pressure would drop low- but no problems 01/2016- surgery  . History of blood transfusion   . History of mitral valve prolapse 1988   with palpitations  . History of radiation therapy 04/02/16- 05/21/16   Left Chest Wall, SCV, Axilla, IM nodes  . Hypertension    no meds  . Neuropathy    with chemo  . Osteopenia determined by x-ray   . Pneumonia    1980's and 1990's  . Raynaud's disease   . 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 Skin cancer 03/12/2014   basal- back    Past Surgical History:  Procedure Laterality Date  . ABDOMINAL HYSTERECTOMY  2007   with BSO  . APPENDECTOMY  2007  . AXILLARY LYMPH NODE DISSECTION Left 02/14/2016   Procedure: LEFT AXILLARY LYMPH NODE DISSECTION;  Surgeon: [name], MD;  Location: [facility];  Service: General;  Laterality: Left;  . BREAST RECONSTRUCTION WITH PLACEMENT OF TISSUE EXPANDER AND FLEX HD (ACELLULAR HYDRATED DERMIS) Left 02/02/2016   Procedure: IMMEDIATE LEFT BREAST RECONSTRUCTION WITH PLACEMENT OF TISSUE EXPANDER AND FLEX HD (ACELLULAR HYDRATED DERMIS);  Surgeon: [name], DO;  Location: [facility];  Service: Plastics;  Laterality: Left;  . BREAST REDUCTION SURGERY Right 03/28/2017   Procedure: RIGHT MAMMARY REDUCTION  (BREAST);  Surgeon: [name], DO;  Location: [facility];  Service: Plastics;  Laterality: Right;  . BREAST SURGERY Left 02/02/16   mastectomy with reconstruction  . c-section  1991  . COLON RESECTION SIGMOID  12/02/2012   [facility]   . HERNIA REPAIR  2007  . LAPAROSCOPIC ENDOMETRIOSIS FULGURATION  1984, 1989  . MASTOPEXY Right 03/28/2017   Procedure: MASTOPEXY;  Surgeon: [name], DO;  Location: [facility];  Service: Plastics;  Laterality: Right;  . Port- a- cath placement  07/2015  . PORT-A-CATH REMOVAL Right 02/02/2016   Procedure: REMOVAL PORT-A-CATH;  Surgeon: [name], MD;  Location: [facility];  Service: General;  Laterality: Right;  . RADIOACTIVE SEED GUIDED AXILLARY SENTINEL LYMPH NODE Left 02/02/2016   Procedure: LEFT BREAST MASTECTOMY AND RADIOACTIVE SEED GUIDED LEFT SENTINEL LYMPH NODE BIOPSY ;  Surgeon: [name], MD;  Location: [facility];  Service: General;  Laterality: Left;  . REMOVAL OF TISSUE EXPANDER AND PLACEMENT OF IMPLANT Left 03/28/2017   Procedure: REMOVAL OF TISSUE EXPANDER AND PLACEMENT OF IMPLANT;  Surgeon: [name], DO;  Location: [facility];  Service: Plastics;  Laterality: Left;    There were no vitals filed for this visit.  Subjective Assessment - 04/17/17 1115    Subjective Last radiation with be august 29 She is still having pain. She has had trouble with vomiting with radiation, but has figured out a way to help that. She is still waiting on consults form MD Ouida Sills. She had surgery to remove the expander and added an implant  on the right and has breast reduction on the left.  She has just noticed that she has swelling in her left upper and lower arm that developed since surgery.    Pertinent History Breast cancer diagnosed  November2016 She had chemotherapy. but did have some neuropathy so it had to be stopped. On June 8, she had a mastectomy with immedicate expander placement and with first set of lymphnodes removed, On June 20 she went for complete axillary lymph node dissection. She says she has had always been "full necked" but has neck and upper body fullness since the cancer was diagnosed. She reports she has osteopenia in low back and has had a fracture that was discovered in 2014 when she had a Dexa scan.   Patient Stated Goals to decrease the pain    Currently in Pain? Yes   Pain Score 4    Pain Location Back  tightness in her left axilla    Pain Orientation Mid;Lower   Pain Type Chronic pain   Pain Radiating Towards sometimes to the sides    Pain Onset More than a month ago   Pain Frequency Constant   Aggravating Factors  getting up and moving    Pain Relieving Factors sitting with lumbar pillow and heating pad                LYMPHEDEMA/ONCOLOGY QUESTIONNAIRE - 04/17/17 1130      Type   Cancer Type left breast      What other symptoms do you have   Are you Having Heaviness or Tightness Yes   Are you having Pain Yes   Are you having pitting edema Yes   Body Site left forearm   Is it Hard or Difficult finding clothes that fit Yes   Do you have infections No   Stemmer Sign No     Right Upper Extremity Lymphedema   15 cm Proximal to Olecranon Process 29.5 cm   10 cm Proximal to Olecranon Process 25.6 cm   Olecranon Process 23 cm   15 cm Proximal to Ulnar Styloid Process 22.5 cm   10 cm Proximal to Ulnar Styloid Process 20.5 cm   Just Proximal to Ulnar Styloid Process 15 cm   Across Hand at [employer] 19 cm   At [place] of 2nd Digit 5.5 cm     Left Upper Extremity Lymphedema   15 cm Proximal to Olecranon Process 32 cm   10 cm Proximal to Olecranon Process 28 cm   Olecranon Process 25 cm   15 cm Proximal to Ulnar Styloid Process 25 cm   10 cm Proximal to Ulnar Styloid Process  22.3 cm   Just Proximal to Ulnar Styloid Process 15.5 cm   Across Hand at [employer] 18.4 cm   At [place] of 2nd Digit 5.7 cm                             Short Term Clinic Goals - 04/18/17 1005      CC Short Term Goal  #1   Title Pt will report she knows strategies  for good body mechanics to decrease low back pain    Time 4   Period Weeks     CC Short Term Goal  #2   Title Pt will have a deacrease in circumference of left forearm by 1 cm    Baseline 25 cm    Time 4   Period Weeks   Status New     CC Short Term Goal  #3   Title Pt will report she knows how to manage left UE lymphedema with use of compression, elevation and exercise, self manual lymph drainage and possibly Flexitouch    Baseline 4   Period Weeks   Status New             Long Term Clinic Goals - 04/18/17 1004      CC Long Term Goal  #1   Title Patient will report  that she can manage back pain  so she can perform daily activities with greater ease   Time 8   Period Weeks   Status New     CC Long Term Goal  #2   Title Pt will  have given the TENS unit a trial to see if it will help with back pain    Time 8   Period Weeks   Status New     CC Long Term Goal  #3   Title Pt will be independent in core and  strengthening exercises    Time 8   Period Weeks   Status New            Plan - 04/18/17 0956    Clinical Impression Statement Alison [name] returns to PT after having bilataral breast surgey . She is currently undergoing radiation treatment to low back to help with pain from bone metastasis. She has developed pitting lymphedema in her left arm, mostly in forearm, since surgery and continues with swelling in left anterior axilla, neck and back.Alison [name] has a compression sleeve at home that she has not worm  She had been previously under certification of Dr. Barry Dienes, but since Alison [name] is not seeing her any longer, a new referral has been received by Dr. [name].  We also have a  prescription for a TENS unit.  Renewal was done today with discharge sent to Dr. Barry Dienes and recertification sent to Dr. [name]    Rehab Potential Fair   Clinical Impairments Affecting Rehab Potential chemotherapy with neuropathy , radiation to back    PT Frequency 2x / week   PT Duration 8 weeks   PT Next Visit Plan manual lymph draiange to left arm , check compression sleeve, use TENS for pain managment, add condiitioning exercise contact Flexiotouch to see if Zia can get arm sleeve to use with her head and neck unit.    Consulted and Agree with Plan of Care Patient      Patient will benefit from skilled therapeutic intervention in order to improve the following deficits and impairments:  Decreased range of motion, Impaired UE functional use, Decreased activity tolerance, Decreased knowledge of precautions, Decreased skin integrity, Impaired perceived functional ability, Pain, Decreased knowledge of use of DME, Increased edema, Postural dysfunction, Decreased strength, Impaired sensation, Decreased scar mobility, Decreased mobility, Increased muscle spasms  Visit Diagnosis: Abnormal posture  Lymphedema, not elsewhere classified  Stiffness of left shoulder joint  Low back pain with sciatica, sciatica laterality unspecified, unspecified back pain laterality, unspecified chronicity     Problem List Patient Active Problem List   Diagnosis Date Noted  .  Counseling regarding goals of care 04/16/2017  . Bone metastases ([place]) 02/28/2017  . Uncontrolled pain 02/28/2017  . Osteopenia determined by x-ray 09/20/2016  . Primary malignant neoplasm of left breast with stage 2 nodal metastasis per American Joint Committee on Cancer 7th edition (N2) ([place]) 02/14/2016  . 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 Insomnia 09/01/2015  . Malignant neoplasm of upper-outer quadrant of left breast in female, estrogen receptor positive ([place]) 08/08/2015  Donato Heinz. Owens Shark PT   Norwood Levo 04/18/2017, 10:10 AM  [place], [place], [address] Phone: [phone]   Fax:  [phone]  Name: Alison [name] MRN: [id number] Date of Birth: [birth year]

## 2017-04-19 ENCOUNTER — Telehealth: Payer: Self-pay

## 2017-04-19 ENCOUNTER — Ambulatory Visit
Admission: RE | Admit: 2017-04-19 | Discharge: 2017-04-19 | Disposition: A | Discharge status: Home / Self Care | Payer: 59 | Source: Ambulatory Visit | Attending: Radiation Oncology | Admitting: Radiation Oncology

## 2017-04-19 ENCOUNTER — Ambulatory Visit: Payer: 59

## 2017-04-19 DIAGNOSIS — Z510 Encounter for antineoplastic radiation therapy: Secondary | ICD-10-CM | POA: Diagnosis not present

## 2017-04-19 MED ORDER — ZOLPIDEM TARTRATE 5 MG PO TABS: 5.0000 mg | ORAL_TABLET | Freq: Every evening | ORAL | 2 refills | Status: DC | PRN

## 2017-04-19 NOTE — Telephone Encounter (Signed)
Pt called that pharmacy received trazodone refill but not ambien. Noted ambien rx was printed. This RN called in rx.

## 2017-04-22 ENCOUNTER — Ambulatory Visit: Payer: 59

## 2017-04-22 ENCOUNTER — Ambulatory Visit
Admission: RE | Admit: 2017-04-22 | Discharge: 2017-04-22 | Disposition: A | Discharge status: Home / Self Care | Payer: 59 | Source: Ambulatory Visit | Attending: Radiation Oncology | Admitting: Radiation Oncology

## 2017-04-22 DIAGNOSIS — Z510 Encounter for antineoplastic radiation therapy: Secondary | ICD-10-CM | POA: Diagnosis not present

## 2017-04-23 ENCOUNTER — Other Ambulatory Visit (HOSPITAL_BASED_OUTPATIENT_CLINIC_OR_DEPARTMENT_OTHER): Payer: 59

## 2017-04-23 ENCOUNTER — Ambulatory Visit: Payer: 59 | Admitting: Physical Therapy

## 2017-04-23 ENCOUNTER — Ambulatory Visit
Admission: RE | Admit: 2017-04-23 | Discharge: 2017-04-23 | Disposition: A | Discharge status: Home / Self Care | Payer: 59 | Source: Ambulatory Visit | Attending: Radiation Oncology | Admitting: Radiation Oncology

## 2017-04-23 ENCOUNTER — Telehealth: Payer: Self-pay

## 2017-04-23 ENCOUNTER — Ambulatory Visit (HOSPITAL_BASED_OUTPATIENT_CLINIC_OR_DEPARTMENT_OTHER): Payer: 59

## 2017-04-23 ENCOUNTER — Encounter: Payer: Self-pay | Admitting: Radiation Oncology

## 2017-04-23 VITALS — BP 158/81 | HR 104 | Temp 99.1°F

## 2017-04-23 DIAGNOSIS — C779 Secondary and unspecified malignant neoplasm of lymph node, unspecified: Secondary | ICD-10-CM

## 2017-04-23 DIAGNOSIS — M5440 Lumbago with sciatica, unspecified side: Secondary | ICD-10-CM

## 2017-04-23 DIAGNOSIS — C50912 Malignant neoplasm of unspecified site of left female breast: Secondary | ICD-10-CM

## 2017-04-23 DIAGNOSIS — M8580 Other specified disorders of bone density and structure, unspecified site: Secondary | ICD-10-CM

## 2017-04-23 DIAGNOSIS — Z170 Estrogen receptor positive status [ER+]: Secondary | ICD-10-CM

## 2017-04-23 DIAGNOSIS — Z5111 Encounter for antineoplastic chemotherapy: Secondary | ICD-10-CM | POA: Diagnosis not present

## 2017-04-23 DIAGNOSIS — R293 Abnormal posture: Secondary | ICD-10-CM | POA: Diagnosis not present

## 2017-04-23 DIAGNOSIS — M25612 Stiffness of left shoulder, not elsewhere classified: Secondary | ICD-10-CM

## 2017-04-23 DIAGNOSIS — Z510 Encounter for antineoplastic radiation therapy: Secondary | ICD-10-CM | POA: Diagnosis not present

## 2017-04-23 DIAGNOSIS — C50412 Malignant neoplasm of upper-outer quadrant of left female breast: Secondary | ICD-10-CM

## 2017-04-23 DIAGNOSIS — C7951 Secondary malignant neoplasm of bone: Secondary | ICD-10-CM | POA: Diagnosis not present

## 2017-04-23 DIAGNOSIS — I890 Lymphedema, not elsewhere classified: Secondary | ICD-10-CM

## 2017-04-23 LAB — CBC WITH DIFFERENTIAL/PLATELET
BASO%: 1.7 % (ref 0.0–2.0)
BASOS ABS: 0 10*3/uL (ref 0.0–0.1)
EOS ABS: 0.1 10*3/uL (ref 0.0–0.5)
EOS%: 4 % (ref 0.0–7.0)
HCT: 31.5 % — ABNORMAL LOW (ref 34.8–46.6)
HGB: 10.7 g/dL — ABNORMAL LOW (ref 11.6–15.9)
LYMPH%: 15.6 % (ref 14.0–49.7)
MCH: 34.4 pg — AB (ref 25.1–34.0)
MCHC: 33.9 g/dL (ref 31.5–36.0)
MCV: 101.5 fL — ABNORMAL HIGH (ref 79.5–101.0)
MONO#: 0.3 10*3/uL (ref 0.1–0.9)
MONO%: 15 % — AB (ref 0.0–14.0)
NEUT#: 1.2 10*3/uL — ABNORMAL LOW (ref 1.5–6.5)
NEUT%: 63.7 % (ref 38.4–76.8)
Platelets: 193 10*3/uL (ref 145–400)
RBC: 3.1 10*6/uL — AB (ref 3.70–5.45)
RDW: 18.8 % — AB (ref 11.2–14.5)
WBC: 1.8 10*3/uL — AB (ref 3.9–10.3)
lymph#: 0.3 10*3/uL — ABNORMAL LOW (ref 0.9–3.3)

## 2017-04-23 LAB — COMPREHENSIVE METABOLIC PANEL
ALT: 17 U/L (ref 0–55)
AST: 29 U/L (ref 5–34)
Albumin: 3.4 g/dL — ABNORMAL LOW (ref 3.5–5.0)
Alkaline Phosphatase: 72 U/L (ref 40–150)
Anion Gap: 5 mEq/L (ref 3–11)
BUN: 5.6 mg/dL — AB (ref 7.0–26.0)
CO2: 25 meq/L (ref 22–29)
Calcium: 8.6 mg/dL (ref 8.4–10.4)
Chloride: 107 mEq/L (ref 98–109)
Creatinine: 0.7 mg/dL (ref 0.6–1.1)
GLUCOSE: 109 mg/dL (ref 70–140)
POTASSIUM: 4.5 meq/L (ref 3.5–5.1)
SODIUM: 137 meq/L (ref 136–145)
Total Bilirubin: 0.36 mg/dL (ref 0.20–1.20)
Total Protein: 6.4 g/dL (ref 6.4–8.3)

## 2017-04-23 MED ORDER — FULVESTRANT 250 MG/5ML IM SOLN
500.0000 mg | Freq: Once | INTRAMUSCULAR | Status: AC
  Administered 2017-04-23: 500 mg via INTRAMUSCULAR
  Filled 2017-04-23: qty 10

## 2017-04-23 MED ORDER — DENOSUMAB 120 MG/1.7ML ~~LOC~~ SOLN
120.0000 mg | Freq: Once | SUBCUTANEOUS | Status: AC
  Administered 2017-04-23: 120 mg via SUBCUTANEOUS
  Filled 2017-04-23: qty 1.7

## 2017-04-23 NOTE — Patient Instructions (Signed)
Denosumab injection What is this medicine? DENOSUMAB (den oh sue mab) slows bone breakdown. Prolia is used to treat osteoporosis in women after menopause and in men. Delton See is used to treat a high calcium level due to cancer and to prevent bone fractures and other bone problems caused by multiple myeloma or cancer bone metastases. Delton See is also used to treat giant cell tumor of the bone. This medicine may be used for other purposes; ask your health care provider or pharmacist if you have questions. COMMON BRAND NAME(S): Prolia, XGEVA What should I tell my health care provider before I take this medicine? They need to know if you have any of these conditions: -dental disease -having surgery or tooth extraction -infection -kidney disease -low levels of calcium or Vitamin D in the blood -malnutrition -on hemodialysis -skin conditions or sensitivity -thyroid or parathyroid disease -an unusual reaction to denosumab, other medicines, foods, dyes, or preservatives -pregnant or trying to get pregnant -breast-feeding How should I use this medicine? This medicine is for injection under the skin. It is given by a health care professional in a hospital or clinic setting. If you are getting Prolia, a special MedGuide will be given to you by the pharmacist with each prescription and refill. Be sure to read this information carefully each time. For Prolia, talk to your pediatrician regarding the use of this medicine in children. Special care may be needed. For Delton See, talk to your pediatrician regarding the use of this medicine in children. While this drug may be prescribed for children as young as 13 years for selected conditions, precautions do apply. Overdosage: If you think you have taken too much of this medicine contact a poison control center or emergency room at once. NOTE: This medicine is only for you. Do not share this medicine with others. What if I miss a dose? It is important not to miss your  dose. Call your doctor or health care professional if you are unable to keep an appointment. What may interact with this medicine? Do not take this medicine with any of the following medications: -other medicines containing denosumab This medicine may also interact with the following medications: -medicines that lower your chance of fighting infection -steroid medicines like prednisone or cortisone This list may not describe all possible interactions. Give your health care provider a list of all the medicines, herbs, non-prescription drugs, or dietary supplements you use. Also tell them if you smoke, drink alcohol, or use illegal drugs. Some items may interact with your medicine. What should I watch for while using this medicine? Visit your doctor or health care professional for regular checks on your progress. Your doctor or health care professional may order blood tests and other tests to see how you are doing. Call your doctor or health care professional for advice if you get a fever, chills or sore throat, or other symptoms of a cold or flu. Do not treat yourself. This drug may decrease your body's ability to fight infection. Try to avoid being around people who are sick. You should make sure you get enough calcium and vitamin D while you are taking this medicine, unless your doctor tells you not to. Discuss the foods you eat and the vitamins you take with your health care professional. See your dentist regularly. Brush and floss your teeth as directed. Before you have any dental work done, tell your dentist you are receiving this medicine. Do not become pregnant while taking this medicine or for 5 months after stopping  it. Talk with your doctor or health care professional about your birth control options while taking this medicine. Women should inform their doctor if they wish to become pregnant or think they might be pregnant. There is a potential for serious side effects to an unborn child. Talk  to your health care professional or pharmacist for more information. What side effects may I notice from receiving this medicine? Side effects that you should report to your doctor or health care professional as soon as possible: -allergic reactions like skin rash, itching or hives, swelling of the face, lips, or tongue -bone pain -breathing problems -dizziness -jaw pain, especially after dental work -redness, blistering, peeling of the skin -signs and symptoms of infection like fever or chills; cough; sore throat; pain or trouble passing urine -signs of low calcium like fast heartbeat, muscle cramps or muscle pain; pain, tingling, numbness in the hands or feet; seizures -unusual bleeding or bruising -unusually weak or tired Side effects that usually do not require medical attention (report to your doctor or health care professional if they continue or are bothersome): -constipation -diarrhea -headache -joint pain -loss of appetite -muscle pain -runny nose -tiredness -upset stomach This list may not describe all possible side effects. Call your doctor for medical advice about side effects. You may report side effects to FDA at 1-800-FDA-1088. Where should I keep my medicine? This medicine is only given in a clinic, doctor's office, or other health care setting and will not be stored at home. NOTE: This sheet is a summary. It may not cover all possible information. If you have questions about this medicine, talk to your doctor, pharmacist, or health care provider.  2018 Elsevier/Gold Standard (2016-09-04 19:17:21)   Fulvestrant injection What is this medicine? FULVESTRANT (ful VES trant) blocks the effects of estrogen. It is used to treat breast cancer. This medicine may be used for other purposes; ask your health care provider or pharmacist if you have questions. COMMON BRAND NAME(S): FASLODEX What should I tell my health care provider before I take this medicine? They need to  know if you have any of these conditions: -bleeding problems -liver disease -low levels of platelets in the blood -an unusual or allergic reaction to fulvestrant, other medicines, foods, dyes, or preservatives -pregnant or trying to get pregnant -breast-feeding How should I use this medicine? This medicine is for injection into a muscle. It is usually given by a health care professional in a hospital or clinic setting. Talk to your pediatrician regarding the use of this medicine in children. Special care may be needed. Overdosage: If you think you have taken too much of this medicine contact a poison control center or emergency room at once. NOTE: This medicine is only for you. Do not share this medicine with others. What if I miss a dose? It is important not to miss your dose. Call your doctor or health care professional if you are unable to keep an appointment. What may interact with this medicine? -medicines that treat or prevent blood clots like warfarin, enoxaparin, and dalteparin This list may not describe all possible interactions. Give your health care provider a list of all the medicines, herbs, non-prescription drugs, or dietary supplements you use. Also tell them if you smoke, drink alcohol, or use illegal drugs. Some items may interact with your medicine. What should I watch for while using this medicine? Your condition will be monitored carefully while you are receiving this medicine. You will need important blood work done while you   this medicine. Do not become pregnant while taking this medicine or for at least 1 year after stopping it. Women of child-bearing potential will need to have a negative pregnancy test before starting this medicine. Women should inform their doctor if they wish to become pregnant or think they might be pregnant. There is a potential for serious side effects to an unborn child. Men should inform their doctors if they wish to father a child. This  medicine may lower sperm counts. Talk to your health care professional or pharmacist for more information. Do not breast-feed an infant while taking this medicine or for 1 year after the last dose. What side effects may I notice from receiving this medicine? Side effects that you should report to your doctor or health care professional as soon as possible: -allergic reactions like skin rash, itching or hives, swelling of the face, lips, or tongue -feeling faint or lightheaded, falls -pain, tingling, numbness, or weakness in the legs -signs and symptoms of infection like fever or chills; cough; flu-like symptoms; sore throat -vaginal bleeding Side effects that usually do not require medical attention (report to your doctor or health care professional if they continue or are bothersome): -aches, pains -constipation -diarrhea -headache -hot flashes -nausea, vomiting -pain at site where injected -stomach pain This list may not describe all possible side effects. Call your doctor for medical advice about side effects. You may report side effects to FDA at 1-800-FDA-1088. Where should I keep my medicine? This drug is given in a hospital or clinic and will not be stored at home. NOTE: This sheet is a summary. It may not cover all possible information. If you have questions about this medicine, talk to your doctor, pharmacist, or health care provider.  2018 Elsevier/Gold Standard (2015-03-11 11:03:55)

## 2017-04-23 NOTE — Progress Notes (Signed)
Pt in for Xgeva and Faslodex injections. WBC noted to be low. Collaborative RN notified, will discuss with MD. Pt understands to expect a call re: labs and Abemaciclib dose. Pt requests visit with Dr. [name] to review MD Ouida Sills recommendations. Informed her collab nurse will discuss with MD and call her.

## 2017-04-23 NOTE — Telephone Encounter (Signed)
ANC 1.2.  Per Dr [name] hold Abemaciclib for 1 week and recheck labs.  Lab appt has been made for 9/4 at 11:45.  Pt is aware and verbalizes understanding to hold medication until we see her lab results next week.

## 2017-04-23 NOTE — Therapy (Signed)
[place], [place], [address] Phone: [phone]   Fax:  [phone]  Physical Therapy Treatment  Patient Details  Name: Alison [name] MRN: [id number] Date of Birth: [birth year] Referring Provider: Dr. [name]  Encounter Date: 04/23/2017      PT End of Session - 04/23/17 1258    Visit Number 32   Number of Visits 55   Date for PT Re-Evaluation 06/26/17   PT Start Time 1108   PT Stop Time 1150   PT Time Calculation (min) 42 min   Activity Tolerance Patient tolerated treatment well   Behavior During Therapy [facility] for tasks assessed/performed      Past Medical History:  Diagnosis Date  . Anxiety   . Asthma     seasonal asthma  . Breast cancer ([place])    metastatic breast cancer  . Complication of anesthesia 2007   aspiration pneumonia post hysterectomy.  Blood pressure would drop low- but no problems 01/2016- surgery  . History of blood transfusion   . History of mitral valve prolapse 1988   with palpitations  . History of radiation therapy 04/02/16- 05/21/16   Left Chest Wall, SCV, Axilla, IM nodes  . Hypertension    no meds  . Neuropathy    with chemo  . Osteopenia determined by x-ray   . Pneumonia    1980's and 1990's  . Raynaud's disease   . 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 Skin cancer 03/12/2014   basal- back    Past Surgical History:  Procedure Laterality Date  . ABDOMINAL HYSTERECTOMY  2007   with BSO  . APPENDECTOMY  2007  . AXILLARY LYMPH NODE DISSECTION Left 02/14/2016   Procedure: LEFT AXILLARY LYMPH NODE DISSECTION;  Surgeon: [name], MD;  Location: [facility];  Service: General;  Laterality: Left;  . BREAST RECONSTRUCTION WITH PLACEMENT OF TISSUE EXPANDER AND FLEX HD (ACELLULAR HYDRATED DERMIS) Left 02/02/2016   Procedure: IMMEDIATE LEFT BREAST RECONSTRUCTION WITH PLACEMENT OF TISSUE EXPANDER AND FLEX HD (ACELLULAR HYDRATED DERMIS);   Surgeon: [name], DO;  Location: [facility];  Service: Plastics;  Laterality: Left;  . BREAST REDUCTION SURGERY Right 03/28/2017   Procedure: RIGHT MAMMARY REDUCTION  (BREAST);  Surgeon: [name], DO;  Location: [facility];  Service: Plastics;  Laterality: Right;  . BREAST SURGERY Left 02/02/16   mastectomy with reconstruction  . c-section  1991  . COLON RESECTION SIGMOID  12/02/2012   [facility]   . HERNIA REPAIR  2007  . LAPAROSCOPIC ENDOMETRIOSIS FULGURATION  1984, 1989  . MASTOPEXY Right 03/28/2017   Procedure: MASTOPEXY;  Surgeon: [name], DO;  Location: [facility];  Service: Plastics;  Laterality: Right;  . Port- a- cath placement  07/2015  . PORT-A-CATH REMOVAL Right 02/02/2016   Procedure: REMOVAL PORT-A-CATH;  Surgeon: [name], MD;  Location: [facility] [place];  Service: General;  Laterality: Right;  . RADIOACTIVE SEED GUIDED AXILLARY SENTINEL LYMPH NODE Left 02/02/2016   Procedure: LEFT BREAST MASTECTOMY AND RADIOACTIVE SEED GUIDED LEFT SENTINEL LYMPH NODE BIOPSY ;  Surgeon: [name], MD;  Location: [facility];  Service: General;  Laterality: Left;  . REMOVAL OF TISSUE EXPANDER AND PLACEMENT OF IMPLANT Left 03/28/2017   Procedure: REMOVAL OF TISSUE EXPANDER AND PLACEMENT OF IMPLANT;  Surgeon: [name], DO;  Location: [facility];  Service: Plastics;  Laterality: Left;  There were no vitals filed for this visit.      Subjective Assessment - 04/23/17 1254    Subjective Pt had her last radiation treatment today.  She says she still has pain, but her pain meds are better able to control it. She says that she is, and appears to be, very tired. She wore the tg soft last week, but took it off on Saturday when she noticed increased swelling in her arm.  She says it is better now.  She also noticed pitting edema in her leg over the weekend that has resolved  She  brings in the reports of the second opinion from MD [facility].  She plans to meet with Dr. [name] to decide what she will do.    Pertinent History Breast cancer diagnosed November2016 She had chemotherapy. but did have some neuropathy so it had to be stopped. On June 8, she had a mastectomy with immedicate expander placement and with first set of lymphnodes removed, On June 20 she went for complete axillary lymph node dissection. She says she has had always been "full necked" but has neck and upper body fullness since the cancer was diagnosed. She reports she has osteopenia in low back and has had a fracture that was discovered in 2014 when she had a Dexa scan.   Patient Stated Goals to decrease the pain    Currently in Pain? Yes   Pain Score --  did not rate,                          [facility] Adult PT Treatment/Exercise - 04/23/17 0001      Manual Therapy   Manual Lymphatic Drainage (MLD) in supine short neck, superficial and deep abdominals, right axillary nodes, anterior interaxillary anastamosis, left arm to hand and return with extra time spent on forearm then to right sidelying for posterior interaxillary anastamosis. and additional work, on entire left arm                    Short Term Clinic Goals - 04/18/17 1005      CC Short Term Goal  #1   Title Pt will report she knows strategies for good body mechanics to decrease low back pain    Time 4   Period Weeks     CC Short Term Goal  #2   Title Pt will have a deacrease in circumference of left forearm by 1 cm    Baseline 25 cm    Time 4   Period Weeks   Status New     CC Short Term Goal  #3   Title Pt will report she knows how to manage left UE lymphedema with use of compression, elevation and exercise, self manual lymph drainage and possibly Flexitouch    Baseline 4   Period Weeks   Status New             Long Term Clinic Goals - 04/18/17 1004      CC Long Term Goal  #1   Title Patient will  report  that she can manage back pain  so she can perform daily activities with greater ease   Time 8   Period Weeks   Status New     CC Long Term Goal  #2   Title Pt will  have given the TENS unit a trial to see if it will help with back pain    Time 8  Period Weeks   Status New     CC Long Term Goal  #3   Title Pt will be independent in core and  strengthening exercises    Time 8   Period Weeks   Status New            Plan - 04/23/17 1259    Clinical Impression Statement Pt continues with pitting edema in her left arm, mostly in forearm,  Discussed treatment options with patient who would prefer not to do bandaging, Will send for prescription for circaid reduction kit as bandaing alternate, flat knit daytime sleeve and gauntlet and nighttime garment ( likely a Reid sleeve Comfort) to help patinet manage her symtoms moving forward    PT Next Visit Plan manual lymph draiange to left arm , help with getting garments, then  use TENS for pain managment, add condiitioning exercise contact Flexiotouch to follow up on them getting am arm sleeve and reprogram her unit    Consulted and Agree with Plan of Care Patient      Patient will benefit from skilled therapeutic intervention in order to improve the following deficits and impairments:  Decreased range of motion, Impaired UE functional use, Decreased activity tolerance, Decreased knowledge of precautions, Decreased skin integrity, Impaired perceived functional ability, Pain, Decreased knowledge of use of DME, Increased edema, Postural dysfunction, Decreased strength, Impaired sensation, Decreased scar mobility, Decreased mobility, Increased muscle spasms  Visit Diagnosis: Abnormal posture  Lymphedema, not elsewhere classified  Stiffness of left shoulder joint  Low back pain with sciatica, sciatica laterality unspecified, unspecified back pain laterality, unspecified chronicity     Problem List Patient Active Problem List    Diagnosis Date Noted  . Counseling regarding goals of care 04/16/2017  . Bone metastases ([place]) 02/28/2017  . Uncontrolled pain 02/28/2017  . Osteopenia determined by x-ray 09/20/2016  . Primary malignant neoplasm of left breast with stage 2 nodal metastasis per American Joint Committee on Cancer 7th edition (N2) ([place]) 02/14/2016  . Chemotherapy-induced neuropathy ([place]) 12/30/2015  . Insomnia 09/01/2015  . Malignant neoplasm of upper-outer quadrant of left breast in female, estrogen receptor positive ([place]) 08/08/2015   Donato Heinz. Owens Shark PT  Norwood Levo 04/23/2017, 1:03 PM  [place], [place], [address] Phone: [phone]   Fax:  [phone]  Name: Alison [name] MRN: [id number] Date of Birth: 07-21-1961

## 2017-04-24 NOTE — Progress Notes (Signed)
  Radiation Oncology         [phone]) [phone] ________________________________  Name: Alison [name] MRN: [id number]  Date: 04/23/2017  DOB: 09-Jun-1961  End of Treatment Note  Diagnosis:   56 y.o. 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 -  Beams/energy:   3D // 10X, 15X Photon  Narrative: The patient tolerated radiation treatment relatively well.  She experienced mild fatigue. Throughout her treatment she reported continued pain to her back that was relieved with medication. She also reported continued nausea before and after her radiation treatments, improved by pre-RT antiemetics. She denied vomiting. She had mild itching over back. On physical exam her skin appeared normal. I advised her to apply radiaplex and hydrocortisone 1% cream to her treatment area prn itching.  Plan: The patient has completed radiation treatment. The patient will be called to return to radiation oncology clinic for routine followup in one month. I advised them to call or return sooner if they have any questions or concerns related to their recovery or treatment.  -----------------------------------  Eppie Gibson, MD  This document serves as a record of services personally performed by Eppie Gibson, MD. It was created on her behalf by Rae Lips, a trained medical scribe. The creation of this record is based on the scribe's personal observations and the provider's statements to them. This document has been checked and approved by the attending provider.

## 2017-04-30 ENCOUNTER — Other Ambulatory Visit (HOSPITAL_BASED_OUTPATIENT_CLINIC_OR_DEPARTMENT_OTHER): Payer: 59

## 2017-04-30 ENCOUNTER — Other Ambulatory Visit: Payer: Self-pay

## 2017-04-30 DIAGNOSIS — C779 Secondary and unspecified malignant neoplasm of lymph node, unspecified: Principal | ICD-10-CM

## 2017-04-30 DIAGNOSIS — C50412 Malignant neoplasm of upper-outer quadrant of left female breast: Secondary | ICD-10-CM

## 2017-04-30 DIAGNOSIS — Z170 Estrogen receptor positive status [ER+]: Secondary | ICD-10-CM

## 2017-04-30 DIAGNOSIS — C7951 Secondary malignant neoplasm of bone: Secondary | ICD-10-CM

## 2017-04-30 DIAGNOSIS — C50912 Malignant neoplasm of unspecified site of left female breast: Secondary | ICD-10-CM

## 2017-04-30 LAB — CBC WITH DIFFERENTIAL/PLATELET
BASO%: 2.3 % — AB (ref 0.0–2.0)
Basophils Absolute: 0 10*3/uL (ref 0.0–0.1)
EOS%: 2.9 % (ref 0.0–7.0)
Eosinophils Absolute: 0.1 10*3/uL (ref 0.0–0.5)
HEMATOCRIT: 54.2 % — AB (ref 34.8–46.6)
HEMOGLOBIN: 18.4 g/dL — AB (ref 11.6–15.9)
LYMPH#: 0.3 10*3/uL — AB (ref 0.9–3.3)
LYMPH%: 14.6 % (ref 14.0–49.7)
MCH: 34.1 pg — ABNORMAL HIGH (ref 25.1–34.0)
MCHC: 33.9 g/dL (ref 31.5–36.0)
MCV: 100.6 fL (ref 79.5–101.0)
MONO#: 0.2 10*3/uL (ref 0.1–0.9)
MONO%: 12.9 % (ref 0.0–14.0)
NEUT%: 67.3 % (ref 38.4–76.8)
NEUTROS ABS: 1.2 10*3/uL — AB (ref 1.5–6.5)
Platelets: 93 10*3/uL — ABNORMAL LOW (ref 145–400)
RBC: 5.39 10*6/uL (ref 3.70–5.45)
RDW: 17 % — ABNORMAL HIGH (ref 11.2–14.5)
WBC: 1.7 10*3/uL — AB (ref 3.9–10.3)
nRBC: 0 % (ref 0–0)

## 2017-04-30 MED ORDER — OXYCODONE HCL 15 MG PO TABS: 15.0000 mg | ORAL_TABLET | ORAL | 0 refills | Status: DC | PRN

## 2017-04-30 NOTE — Telephone Encounter (Signed)
ANC 1.2.  Pt to continue to hold Abemaciclib for 1 week and recheck CBC.  Pt made aware to continue to hold medication and appt date/time for lab check

## 2017-05-02 ENCOUNTER — Ambulatory Visit: Payer: 59 | Attending: General Surgery | Admitting: Physical Therapy

## 2017-05-02 DIAGNOSIS — G8929 Other chronic pain: Secondary | ICD-10-CM | POA: Insufficient documentation

## 2017-05-02 DIAGNOSIS — M5440 Lumbago with sciatica, unspecified side: Secondary | ICD-10-CM

## 2017-05-02 DIAGNOSIS — M25612 Stiffness of left shoulder, not elsewhere classified: Secondary | ICD-10-CM | POA: Diagnosis present

## 2017-05-02 DIAGNOSIS — R293 Abnormal posture: Secondary | ICD-10-CM

## 2017-05-02 DIAGNOSIS — I890 Lymphedema, not elsewhere classified: Secondary | ICD-10-CM

## 2017-05-02 DIAGNOSIS — M25512 Pain in left shoulder: Secondary | ICD-10-CM | POA: Insufficient documentation

## 2017-05-02 NOTE — Therapy (Signed)
[place], [place], [address] Phone: [phone]   Fax:  [phone]  Physical Therapy Treatment  Patient Details  Name: Alison [name] MRN: [id number] Date of Birth: 1961-03-21 Referring Provider: Dr. [name]  Encounter Date: 05/02/2017      PT End of Session - 05/02/17 1759    Visit Number 33   Number of Visits 55   Date for PT Re-Evaluation 06/26/17   PT Start Time 0626   PT Stop Time 1600   PT Time Calculation (min) 45 min   Activity Tolerance Patient tolerated treatment well   Behavior During Therapy [facility] for tasks assessed/performed      Past Medical History:  Diagnosis Date  . Anxiety   . Asthma     seasonal asthma  . Breast cancer ([place])    metastatic breast cancer  . Complication of anesthesia 2007   aspiration pneumonia post hysterectomy.  Blood pressure would drop low- but no problems 01/2016- surgery  . History of blood transfusion   . History of mitral valve prolapse 1988   with palpitations  . History of radiation therapy 04/02/16- 05/21/16   Left Chest Wall, SCV, Axilla, IM nodes  . Hypertension    no meds  . Neuropathy    with chemo  . Osteopenia determined by x-ray   . Pneumonia    1980's and 1990's  . Raynaud's disease   . 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 Skin cancer 03/12/2014   basal- back    Past Surgical History:  Procedure Laterality Date  . ABDOMINAL HYSTERECTOMY  2007   with BSO  . APPENDECTOMY  2007  . AXILLARY LYMPH NODE DISSECTION Left 02/14/2016   Procedure: LEFT AXILLARY LYMPH NODE DISSECTION;  Surgeon: [name], MD;  Location: [place];  Service: General;  Laterality: Left;  . BREAST RECONSTRUCTION WITH PLACEMENT OF TISSUE EXPANDER AND FLEX HD (ACELLULAR HYDRATED DERMIS) Left 02/02/2016   Procedure: IMMEDIATE LEFT BREAST RECONSTRUCTION WITH PLACEMENT OF TISSUE EXPANDER AND FLEX HD (ACELLULAR HYDRATED DERMIS);   Surgeon: [name], DO;  Location: [place];  Service: Plastics;  Laterality: Left;  . BREAST REDUCTION SURGERY Right 03/28/2017   Procedure: RIGHT MAMMARY REDUCTION  (BREAST);  Surgeon: [name], DO;  Location: [place];  Service: Plastics;  Laterality: Right;  . BREAST SURGERY Left 02/02/16   mastectomy with reconstruction  . c-section  1991  . COLON RESECTION SIGMOID  12/02/2012   [facility]   . HERNIA REPAIR  2007  . LAPAROSCOPIC ENDOMETRIOSIS FULGURATION  1984, 1989  . MASTOPEXY Right 03/28/2017   Procedure: MASTOPEXY;  Surgeon: [name], DO;  Location: [place];  Service: Plastics;  Laterality: Right;  . Port- a- cath placement  07/2015  . PORT-A-CATH REMOVAL Right 02/02/2016   Procedure: REMOVAL PORT-A-CATH;  Surgeon: [name], MD;  Location: [place];  Service: General;  Laterality: Right;  . RADIOACTIVE SEED GUIDED AXILLARY SENTINEL LYMPH NODE Left 02/02/2016   Procedure: LEFT BREAST MASTECTOMY AND RADIOACTIVE SEED GUIDED LEFT SENTINEL LYMPH NODE BIOPSY ;  Surgeon: [name], MD;  Location: [place];  Service: General;  Laterality: Left;  . REMOVAL OF TISSUE EXPANDER AND PLACEMENT OF IMPLANT Left 03/28/2017   Procedure: REMOVAL OF TISSUE EXPANDER AND PLACEMENT OF IMPLANT;  Surgeon: [name], DO;  Location[place];  Service: Plastics;  Laterality: Left;  There were no vitals filed for this visit.      Subjective Assessment - 05/02/17 1733    Subjective Merla reports that she had her left breast implant incision split open last week and she had it removed in the doctor's office.  She is on second does of cipro for that.  She has not been able to receive her chemo drug the second week in a row due to decreased neutrophils.  She has had problems in increased swelling in her left arm and even into her abdomen and leg , but feels better today.  She  has not gone to [facility] for compression garments yet.    Pertinent History Breast cancer diagnosed November2016 She had chemotherapy. but did have some neuropathy so it had to be stopped. On June 8, she had a mastectomy with immedicate expander placement and with first set of lymphnodes removed, On June 20 she went for complete axillary lymph node dissection. She says she has had always been "full necked" but has neck and upper body fullness since the cancer was diagnosed. She reports she has osteopenia in low back and has had a fracture that was discovered in 2014 when she had a Dexa scan. Pt has had metastasis to the bones of the spine and recent  bilateral breast surgery for a reduction on the right and expander removal on the left with implant placement and removal    Patient Stated Goals to decrease the pain    Currently in Pain? Yes   Pain Score 4                LYMPHEDEMA/ONCOLOGY QUESTIONNAIRE - 05/02/17 1545      Right Upper Extremity Lymphedema   15 cm Proximal to Olecranon Process 28.5 cm   10 cm Proximal to Olecranon Process 25 cm   Olecranon Process 23 cm   15 cm Proximal to Ulnar Styloid Process 22.5 cm   10 cm Proximal to Ulnar Styloid Process 20 cm   Just Proximal to Ulnar Styloid Process 15 cm   Across Hand at [employer] 17.8 cm   At [place] of 2nd Digit 5.5 cm     Left Upper Extremity Lymphedema   15 cm Proximal to Olecranon Process 30 cm   10 cm Proximal to Olecranon Process 27 cm   Olecranon Process 25 cm   15 cm Proximal to Ulnar Styloid Process 25 cm   10 cm Proximal to Ulnar Styloid Process 22.3 cm   Just Proximal to Ulnar Styloid Process 15 cm   Across Hand at [employer] 17.8 cm   At [place] of 2nd Digit 5.8 cm                       [facility] Adult PT Treatment/Exercise - 05/02/17 0001      Manual Therapy   Manual therapy comments outlined red area on chest. quesiton if it is contributed to by tightness of bra band  Pt will get a  camisole at [facility].    Edema Management remeasured both arms.  discussed compression garments and contacted Jemela at [employer] to discuss a juxta fit ready to wear armsleeve and hand piece and an off the shelf flat knit compression sleeve and gauntlet.                 PT Education - 05/02/17 1758    Education provided Yes   Education Details go to  [facility] for day and nighttime compression garment    Person(s) Educated Patient   Methods Explanation;Handout   Comprehension Verbalized understanding           Short Term Clinic Goals - 04/18/17 1005      CC Short Term Goal  #1   Title Pt will report she knows strategies for good body mechanics to decrease low back pain    Time 4   Period Weeks     CC Short Term Goal  #2   Title Pt will have a deacrease in circumference of left forearm by 1 cm    Baseline 25 cm    Time 4   Period Weeks   Status New     CC Short Term Goal  #3   Title Pt will report she knows how to manage left UE lymphedema with use of compression, elevation and exercise, self manual lymph drainage and possibly Flexitouch    Baseline 4   Period Weeks   Status New             Long Term Clinic Goals - 04/18/17 1004      CC Long Term Goal  #1   Title Patient will report  that she can manage back pain  so she can perform daily activities with greater ease   Time 8   Period Weeks   Status New     CC Long Term Goal  #2   Title Pt will  have given the TENS unit a trial to see if it will help with back pain    Time 8   Period Weeks   Status New     CC Long Term Goal  #3   Title Pt will be independent in core and  strengthening exercises    Time 8   Period Weeks   Status New            Plan - 05/02/17 1759    Clinical Impression Statement Ashlen has had more difficulties now with removal of implant and possible cellulitis in chest.  She has had intermiitent swelling in her arm and will get compression garments  tomorrow to help with managment.  She is also in process of getting and armsleeve for the Flexitouch controller that she already has    PT Frequency 2x / week   PT Duration 8 weeks   PT Treatment/Interventions ADLs/Self Care Home Management;Manual lymph drainage;Compression bandaging;Scar mobilization;Passive range of motion;Patient/family education;Taping;Manual techniques;Therapeutic exercise;Therapeutic activities;Orthotic Fit/Training;DME Instruction;Electrical Stimulation;Moist Heat   PT Next Visit Plan Once lymphedema is under control progress with general exercise for strengthening and pain managment with TENS as needed.       Patient will benefit from skilled therapeutic intervention in order to improve the following deficits and impairments:     Visit Diagnosis: Abnormal posture  Lymphedema, not elsewhere classified  Low back pain with sciatica, sciatica laterality unspecified, unspecified back pain laterality, unspecified chronicity  Chronic left shoulder pain     Problem List Patient Active Problem List   Diagnosis Date Noted  . Counseling regarding goals of care 04/16/2017  . Bone metastases ([place]) 02/28/2017  . Uncontrolled pain 02/28/2017  . Osteopenia determined by x-ray 09/20/2016  . Primary malignant neoplasm of left breast with stage 2 nodal metastasis per American Joint Committee on Cancer 7th edition (N2) ([place]) 02/14/2016  . Chemotherapy-induced neuropathy ([place]) 12/30/2015  . Insomnia 09/01/2015  . Malignant neoplasm of upper-outer quadrant of left breast in female, estrogen receptor  positive ([place]) 08/08/2015   Donato Heinz. Owens Shark PT  Norwood Levo 05/02/2017, 6:04 PM  [place], [place], [address] Phone: [phone]   Fax:  [phone]  Name: EVERLEY EVORA MRN: [id number] Date of Birth: 1961/03/24

## 2017-05-03 ENCOUNTER — Encounter: Payer: Self-pay | Admitting: Pharmacist

## 2017-05-03 DIAGNOSIS — Z170 Estrogen receptor positive status [ER+]: Principal | ICD-10-CM

## 2017-05-03 DIAGNOSIS — C50412 Malignant neoplasm of upper-outer quadrant of left female breast: Secondary | ICD-10-CM

## 2017-05-03 NOTE — Progress Notes (Signed)
Telephone documentation  Study code: [id number]  Attempted to call patient and review with her the pharmacogenetic information that was obtained for the "Pharmacogenetic analysis of toxicities related to administration of taxanes in breast cancer patients" study. Unable to reach patient. Provided my office number for the patient to call back.  Darl Pikes, PharmD, BCPS Hematology/Oncology Clinical Pharmacist [facility] [phone]

## 2017-05-06 ENCOUNTER — Other Ambulatory Visit: Payer: Self-pay | Admitting: Oncology

## 2017-05-06 DIAGNOSIS — C50412 Malignant neoplasm of upper-outer quadrant of left female breast: Secondary | ICD-10-CM

## 2017-05-06 DIAGNOSIS — Z170 Estrogen receptor positive status [ER+]: Secondary | ICD-10-CM

## 2017-05-06 DIAGNOSIS — C7951 Secondary malignant neoplasm of bone: Secondary | ICD-10-CM

## 2017-05-06 NOTE — Progress Notes (Unsigned)
We received a copy of the consultation from M.D. Anderson on on [place]. Her case was reviewed by Dr. Johny Drilling and he recommended we obtain a bone survey to exclude any critical lesions at high risk for fracture. However there were no changes in the current treatment recommended  In case of progression he suggests capecitabine, eribulin, or Abraxane. If progression is indolent then exemestane and everolimus would be an option. For bony pain he suggests radiation.   To explore the possibility of further studies, a liquid biopsy such as gardening 05/30/1960 may be informative..  Finally he states there is really not much information in terms of CBD oil. He decided would be reasonable for her to try at [facility] it is legal and continues to long as there is benefit and no evidence of contraindications.

## 2017-05-07 ENCOUNTER — Ambulatory Visit: Payer: 59 | Admitting: Physical Therapy

## 2017-05-07 ENCOUNTER — Telehealth: Payer: Self-pay

## 2017-05-07 ENCOUNTER — Other Ambulatory Visit (HOSPITAL_BASED_OUTPATIENT_CLINIC_OR_DEPARTMENT_OTHER): Payer: 59

## 2017-05-07 DIAGNOSIS — R293 Abnormal posture: Secondary | ICD-10-CM

## 2017-05-07 DIAGNOSIS — M5440 Lumbago with sciatica, unspecified side: Secondary | ICD-10-CM

## 2017-05-07 DIAGNOSIS — G8929 Other chronic pain: Secondary | ICD-10-CM

## 2017-05-07 DIAGNOSIS — C50912 Malignant neoplasm of unspecified site of left female breast: Secondary | ICD-10-CM

## 2017-05-07 DIAGNOSIS — C50412 Malignant neoplasm of upper-outer quadrant of left female breast: Secondary | ICD-10-CM

## 2017-05-07 DIAGNOSIS — Z170 Estrogen receptor positive status [ER+]: Secondary | ICD-10-CM

## 2017-05-07 DIAGNOSIS — C779 Secondary and unspecified malignant neoplasm of lymph node, unspecified: Principal | ICD-10-CM

## 2017-05-07 DIAGNOSIS — I890 Lymphedema, not elsewhere classified: Secondary | ICD-10-CM

## 2017-05-07 DIAGNOSIS — M25512 Pain in left shoulder: Secondary | ICD-10-CM

## 2017-05-07 DIAGNOSIS — M25612 Stiffness of left shoulder, not elsewhere classified: Secondary | ICD-10-CM

## 2017-05-07 LAB — CBC WITH DIFFERENTIAL/PLATELET
BASO%: 2.6 % — ABNORMAL HIGH (ref 0.0–2.0)
Basophils Absolute: 0.1 10*3/uL (ref 0.0–0.1)
EOS ABS: 0.1 10*3/uL (ref 0.0–0.5)
EOS%: 3.4 % (ref 0.0–7.0)
HCT: 35 % (ref 34.8–46.6)
HGB: 11.9 g/dL (ref 11.6–15.9)
LYMPH%: 13.6 % — AB (ref 14.0–49.7)
MCH: 34 pg (ref 25.1–34.0)
MCHC: 34 g/dL (ref 31.5–36.0)
MCV: 99.9 fL (ref 79.5–101.0)
MONO#: 0.6 10*3/uL (ref 0.1–0.9)
MONO%: 18.1 % — AB (ref 0.0–14.0)
NEUT#: 2.1 10*3/uL (ref 1.5–6.5)
NEUT%: 62.3 % (ref 38.4–76.8)
PLATELETS: 267 10*3/uL (ref 145–400)
RBC: 3.5 10*6/uL — AB (ref 3.70–5.45)
RDW: 16.9 % — ABNORMAL HIGH (ref 11.2–14.5)
WBC: 3.3 10*3/uL — AB (ref 3.9–10.3)
lymph#: 0.5 10*3/uL — ABNORMAL LOW (ref 0.9–3.3)

## 2017-05-07 NOTE — Therapy (Signed)
[place], [place], [address] Phone: [phone]   Fax:  [phone]  Physical Therapy Treatment  Patient Details  Name: Alison [name] MRN: [id number] Date of Birth: 10/22/1960 Referring Provider: Dr. [name]  Encounter Date: 05/07/2017      PT End of Session - 05/07/17 1659    Visit Number 34   Number of Visits 55   Date for PT Re-Evaluation 06/26/17   PT Start Time 6761   PT Stop Time 1430   PT Time Calculation (min) 45 min   Activity Tolerance Patient tolerated treatment well   Behavior During Therapy [facility] for tasks assessed/performed      Past Medical History:  Diagnosis Date  . Anxiety   . Asthma     seasonal asthma  . Breast cancer ([place])    metastatic breast cancer  . Complication of anesthesia 2007   aspiration pneumonia post hysterectomy.  Blood pressure would drop low- but no problems 01/2016- surgery  . History of blood transfusion   . History of mitral valve prolapse 1988   with palpitations  . History of radiation therapy 04/02/16- 05/21/16   Left Chest Wall, SCV, Axilla, IM nodes  . Hypertension    no meds  . Neuropathy    with chemo  . Osteopenia determined by x-ray   . Pneumonia    1980's and 1990's  . Raynaud's disease   . 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 Skin cancer 03/12/2014   basal- back    Past Surgical History:  Procedure Laterality Date  . ABDOMINAL HYSTERECTOMY  2007   with BSO  . APPENDECTOMY  2007  . AXILLARY LYMPH NODE DISSECTION Left 02/14/2016   Procedure: LEFT AXILLARY LYMPH NODE DISSECTION;  Surgeon: [name], MD;  Location: [place];  Service: General;  Laterality: Left;  . BREAST RECONSTRUCTION WITH PLACEMENT OF TISSUE EXPANDER AND FLEX HD (ACELLULAR HYDRATED DERMIS) Left 02/02/2016   Procedure: IMMEDIATE LEFT BREAST RECONSTRUCTION WITH PLACEMENT OF TISSUE EXPANDER AND FLEX HD (ACELLULAR HYDRATED DERMIS);   Surgeon: [name], DO;  Location: [place];  Service: Plastics;  Laterality: Left;  . BREAST REDUCTION SURGERY Right 03/28/2017   Procedure: RIGHT MAMMARY REDUCTION  (BREAST);  Surgeon: [name], DO;  Location: [place];  Service: Plastics;  Laterality: Right;  . BREAST SURGERY Left 02/02/16   mastectomy with reconstruction  . c-section  1991  . COLON RESECTION SIGMOID  12/02/2012   [facility]   . HERNIA REPAIR  2007  . LAPAROSCOPIC ENDOMETRIOSIS FULGURATION  1984, 1989  . MASTOPEXY Right 03/28/2017   Procedure: MASTOPEXY;  Surgeon: [name], DO;  Location: [place];  Service: Plastics;  Laterality: Right;  . Port- a- cath placement  07/2015  . PORT-A-CATH REMOVAL Right 02/02/2016   Procedure: REMOVAL PORT-A-CATH;  Surgeon: [name], MD;  Location: [place];  Service: General;  Laterality: Right;  . RADIOACTIVE SEED GUIDED AXILLARY SENTINEL LYMPH NODE Left 02/02/2016   Procedure: LEFT BREAST MASTECTOMY AND RADIOACTIVE SEED GUIDED LEFT SENTINEL LYMPH NODE BIOPSY ;  Surgeon: [name], MD;  Location: [place];  Service: General;  Laterality: Left;  . REMOVAL OF TISSUE EXPANDER AND PLACEMENT OF IMPLANT Left 03/28/2017   Procedure: REMOVAL OF TISSUE EXPANDER AND PLACEMENT OF IMPLANT;  Surgeon: [name], DO;  Location: [place];  Service: Plastics;  Laterality: Left;  There were no vitals filed for this visit.      Subjective Assessment - 05/07/17 1656    Subjective Pt reports the redness has decreased from left chest . She goes to see Dr. Marla Roe today.  She c/o pain in left axilla and back and feels that it is due to the weather,  She was measured for her daytime and nighttime garment and should receive them this week    Pertinent History Breast cancer diagnosed November2016 She had chemotherapy. but did have some neuropathy so it had to be stopped. On  June 8, she had a mastectomy with immedicate expander placement and with first set of lymphnodes removed, On June 20 she went for complete axillary lymph node dissection. She says she has had always been "full necked" but has neck and upper body fullness since the cancer was diagnosed. She reports she has osteopenia in low back and has had a fracture that was discovered in 2014 when she had a Dexa scan. Pt has had metastasis to the bones of the spine and recent  bilateral breast surgery for a reduction on the right and expander removal on the left with implant placement and removal    Patient Stated Goals to decrease the pain    Currently in Pain? Yes   Pain Score 4                          [facility] Adult PT Treatment/Exercise - 05/07/17 0001      Manual Therapy   Manual Lymphatic Drainage (MLD) in supine short neck, superficial and deep abdominals, right axillary nodes, anterior interaxillary anastamosis, left arm to hand and return with extra time spent on forearm                    Short Term Clinic Goals - 04/18/17 1005      CC Short Term Goal  #1   Title Pt will report she knows strategies for good body mechanics to decrease low back pain    Time 4   Period Weeks     CC Short Term Goal  #2   Title Pt will have a deacrease in circumference of left forearm by 1 cm    Baseline 25 cm    Time 4   Period Weeks   Status New     CC Short Term Goal  #3   Title Pt will report she knows how to manage left UE lymphedema with use of compression, elevation and exercise, self manual lymph drainage and possibly Flexitouch    Baseline 4   Period Weeks   Status New             Long Term Clinic Goals - 04/18/17 1004      CC Long Term Goal  #1   Title Patient will report  that she can manage back pain  so she can perform daily activities with greater ease   Time 8   Period Weeks   Status New     CC Long Term Goal  #2   Title Pt will  have given the TENS unit  a trial to see if it will help with back pain    Time 8   Period Weeks   Status New     CC Long Term Goal  #3   Title Pt will be independent in core and  strengthening exercises    Time 8   Period Weeks  Status New            Plan - 05/07/17 1659    Clinical Impression Statement Pt contines to have pain in left axilla that she attributes some to the weather. Pitting edema continues in left arm, especailly at forearm   She has been measured for compression garments. Once those are received, will advance exercise phase of treatment    PT Frequency 2x / week   PT Duration 8 weeks   PT Treatment/Interventions ADLs/Self Care Home Management;Manual lymph drainage;Compression bandaging;Scar mobilization;Passive range of motion;Patient/family education;Taping;Manual techniques;Therapeutic exercise;Therapeutic activities;Orthotic Fit/Training;DME Instruction;Electrical Stimulation;Moist Heat   PT Next Visit Plan Once lymphedema is under control progress with general exercise for strengthening and pain managment with TENS as needed.    Consulted and Agree with Plan of Care Patient      Patient will benefit from skilled therapeutic intervention in order to improve the following deficits and impairments:  Decreased range of motion, Impaired UE functional use, Decreased activity tolerance, Decreased knowledge of precautions, Decreased skin integrity, Impaired perceived functional ability, Pain, Decreased knowledge of use of DME, Increased edema, Postural dysfunction, Decreased strength, Impaired sensation, Decreased scar mobility, Decreased mobility, Increased muscle spasms  Visit Diagnosis: Abnormal posture  Lymphedema, not elsewhere classified  Chronic left shoulder pain  Low back pain with sciatica, sciatica laterality unspecified, unspecified back pain laterality, unspecified chronicity  Stiffness of left shoulder joint     Problem List Patient Active Problem List   Diagnosis  Date Noted  . Counseling regarding goals of care 04/16/2017  . Bone metastases ([place]) 02/28/2017  . Uncontrolled pain 02/28/2017  . Osteopenia determined by x-ray 09/20/2016  . Primary malignant neoplasm of left breast with stage 2 nodal metastasis per American Joint Committee on Cancer 7th edition (N2) ([place]) 02/14/2016  . Chemotherapy-induced neuropathy ([place]) 12/30/2015  . Insomnia 09/01/2015  . Malignant neoplasm of upper-outer quadrant of left breast in female, estrogen receptor positive ([place]) 08/08/2015   Donato Heinz. Owens Shark PT  [place] Levo 05/07/2017, 5:01 PM  [place], [place], [address] Phone: [phone]   Fax:  [phone]  Name: NAOMI CASTROGIOVANNI MRN: [id number] Date of Birth: 12/18/1960

## 2017-05-07 NOTE — Telephone Encounter (Signed)
ANC 2.1.  Pt instructed to resume Abemaciclib   Pt verbalizes understanding

## 2017-05-09 ENCOUNTER — Ambulatory Visit: Payer: 59 | Admitting: Physical Therapy

## 2017-05-09 DIAGNOSIS — M25612 Stiffness of left shoulder, not elsewhere classified: Secondary | ICD-10-CM

## 2017-05-09 DIAGNOSIS — G8929 Other chronic pain: Secondary | ICD-10-CM

## 2017-05-09 DIAGNOSIS — R293 Abnormal posture: Secondary | ICD-10-CM

## 2017-05-09 DIAGNOSIS — I890 Lymphedema, not elsewhere classified: Secondary | ICD-10-CM

## 2017-05-09 DIAGNOSIS — M5440 Lumbago with sciatica, unspecified side: Secondary | ICD-10-CM

## 2017-05-09 DIAGNOSIS — M25512 Pain in left shoulder: Secondary | ICD-10-CM

## 2017-05-09 NOTE — Progress Notes (Signed)
Telephone documentation  Study code: [id number]  Spoke with patient over the phone today and reviewed with her the pharmacogenetic information listed below for the four genes of interest of the "Pharmacogenetic analysis of toxicities related to administration of taxanes in breast cancer patients" study. The information below was reviewed with the patient. Offered the patient the option of speaking with a [facility] genetic counselor and she was not interested in making an appointment. Provide my office number to the patient if she had additional questions.  **The buccal swab testing was NOT conducted at a CLIA validated lab. The patient was reminded that the information given was for informational proposes only and should NOT be used to make clinical decisions.   Gene Phenotype  CYP3A4 Normal metabolizer   CYP3A5 Poor metabolizer   SLCO1B1 Normal function  ABCB1 Low function    Darl Pikes, PharmD, BCPS Hematology/Oncology Clinical Pharmacist [facility] [place] [facility] [phone]

## 2017-05-09 NOTE — Therapy (Signed)
[place], [place], [address] Phone: [phone]   Fax:  [phone]  Physical Therapy Treatment  Patient Details  Name: Alison [name] MRN: [id number] Date of Birth: 06-07-1961 Referring Provider: Dr. [name]  Encounter Date: 05/09/2017      PT End of Session - 05/09/17 1552    Visit Number 35   Number of Visits 9   Date for PT Re-Evaluation 06/26/17      Past Medical History:  Diagnosis Date  . Anxiety   . Asthma     seasonal asthma  . Breast cancer ([place])    metastatic breast cancer  . Complication of anesthesia 2007   aspiration pneumonia post hysterectomy.  Blood pressure would drop low- but no problems 01/2016- surgery  . History of blood transfusion   . History of mitral valve prolapse 1988   with palpitations  . History of radiation therapy 04/02/16- 05/21/16   Left Chest Wall, SCV, Axilla, IM nodes  . Hypertension    no meds  . Neuropathy    with chemo  . Osteopenia determined by x-ray   . Pneumonia    1980's and 1990's  . Raynaud's disease   . 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 Skin cancer 03/12/2014   basal- back    Past Surgical History:  Procedure Laterality Date  . ABDOMINAL HYSTERECTOMY  2007   with BSO  . APPENDECTOMY  2007  . AXILLARY LYMPH NODE DISSECTION Left 02/14/2016   Procedure: LEFT AXILLARY LYMPH NODE DISSECTION;  Surgeon: [name], MD;  Location: [facility];  Service: General;  Laterality: Left;  . BREAST RECONSTRUCTION WITH PLACEMENT OF TISSUE EXPANDER AND FLEX HD (ACELLULAR HYDRATED DERMIS) Left 02/02/2016   Procedure: IMMEDIATE LEFT BREAST RECONSTRUCTION WITH PLACEMENT OF TISSUE EXPANDER AND FLEX HD (ACELLULAR HYDRATED DERMIS);  Surgeon: [name], DO;  Location: [facility];  Service: Plastics;  Laterality: Left;  . BREAST REDUCTION SURGERY Right 03/28/2017   Procedure: RIGHT MAMMARY REDUCTION   (BREAST);  Surgeon: [name], DO;  Location: [facility];  Service: Plastics;  Laterality: Right;  . BREAST SURGERY Left 02/02/16   mastectomy with reconstruction  . c-section  1991  . COLON RESECTION SIGMOID  12/02/2012   [facility]   . HERNIA REPAIR  2007  . LAPAROSCOPIC ENDOMETRIOSIS FULGURATION  1984, 1989  . MASTOPEXY Right 03/28/2017   Procedure: MASTOPEXY;  Surgeon: [name], DO;  Location: [facility];  Service: Plastics;  Laterality: Right;  . Port- a- cath placement  07/2015  . PORT-A-CATH REMOVAL Right 02/02/2016   Procedure: REMOVAL PORT-A-CATH;  Surgeon: [name], MD;  Location: [facility];  Service: General;  Laterality: Right;  . RADIOACTIVE SEED GUIDED AXILLARY SENTINEL LYMPH NODE Left 02/02/2016   Procedure: LEFT BREAST MASTECTOMY AND RADIOACTIVE SEED GUIDED LEFT SENTINEL LYMPH NODE BIOPSY ;  Surgeon: [name], MD;  Location: [facility];  Service: General;  Laterality: Left;  . REMOVAL OF TISSUE EXPANDER AND PLACEMENT OF IMPLANT Left 03/28/2017   Procedure: REMOVAL OF TISSUE EXPANDER AND PLACEMENT OF IMPLANT;  Surgeon: [name], DO;  Location: [facility];  Service: Plastics;  Laterality: Left;    There were no vitals filed for this visit.      Subjective Assessment - 05/09/17 1350    Subjective Pt"s counts have improved and she is able to resume her chemo meds.  she is also on another course of cipro.  Velcro garment has arrived.    Pertinent History Breast cancer diagnosed November2016 She had chemotherapy. but did have some neuropathy so it had to be stopped. On June 8, she had a mastectomy with immedicate expander placement and with first set of lymphnodes removed, On June 20 she went for complete axillary lymph node dissection. She says she has had always been "full necked" but has neck and upper body fullness since the cancer was diagnosed. She reports she has osteopenia  in low back and has had a fracture that was discovered in 2014 when she had a Dexa scan. Pt has had metastasis to the bones of the spine and recent  bilateral breast surgery for a reduction on the right and expander removal on the left with implant placement and removal    Patient Stated Goals to decrease the pain    Currently in Pain? Yes   Pain Score 3    Pain Location Axilla   Pain Orientation Left   Pain Descriptors / Indicators Aching   Pain Type Chronic pain                         [facility] Adult PT Treatment/Exercise - 05/09/17 0001      Self-Care   Other Self-Care Comments  pt receved circaid juxtafit essentials and hand piece with over sleeve.  It was fit to her and she was instructed in how to apply it. Pt instructed to wear it tonight to see how it goes.  she will wear it when she is on the plane and for a few minutes after      Lumbar Exercises: Supine   Other Supine Lumbar Exercises prepilates series  10 reps each      Manual Therapy   Manual Lymphatic Drainage (MLD) in right sidelying, manual lymph draiange to anterior and  posterior interaxillary anastamosis , left lateral chest and axilla                 PT Education - 05/09/17 1552    Education provided Yes   Education Details use of circaid essentials arm sleeve and handpiece    Person(s) Educated Patient   Methods Explanation;Demonstration   Comprehension Verbalized understanding;Returned demonstration           Short Term Clinic Goals - 05/09/17 1555      CC Short Term Goal  #1   Title Pt will report she knows strategies for good body mechanics to decrease low back pain    Time 4   Period Weeks   Status On-going     CC Short Term Goal  #2   Title Pt will have a deacrease in circumference of left forearm by 1 cm    Baseline 25 cm    Time 4   Period Weeks   Status On-going     CC Short Term Goal  #3   Title Pt will report she knows how to manage left UE lymphedema with use of  compression, elevation and exercise, self manual lymph drainage and possibly Flexitouch    Baseline 4   Period Weeks   Status On-going             Long Term Clinic Goals - 04/18/17 1004      CC Long Term Goal  #1   Title Patient will report  that she can manage back pain  so she can perform daily activities  with greater ease   Time 8   Period Weeks   Status New     CC Long Term Goal  #2   Title Pt will  have given the TENS unit a trial to see if it will help with back pain    Time 8   Period Weeks   Status New     CC Long Term Goal  #3   Title Pt will be independent in core and  strengthening exercises    Time 8   Period Weeks   Status New            Plan - 05/09/17 1552    Clinical Impression Statement Pt has received velcro nighttime garment for lymphedema management and daytime sleeve is on order.  Pt continues to have pitting edema in left arm and also discomfort in axilla.  She was able to begin core exercise today and perform it well with  no increase in back pain    Clinical Impairments Affecting Rehab Potential chemotherapy with neuropathy , radiation to back    PT Treatment/Interventions ADLs/Self Care Home Management;Manual lymph drainage;Compression bandaging;Scar mobilization;Passive range of motion;Patient/family education;Taping;Manual techniques;Therapeutic exercise;Therapeutic activities;Orthotic Fit/Training;DME Instruction;Electrical Stimulation;Moist Heat   PT Next Visit Plan Remeasure left arm for goal assessment. Monitor lymphedema, progress core  general exercise for strengthening and pain managment with TENS as needed. MLD as needed for pain managment of left axillary discomfort    Consulted and Agree with Plan of Care Patient      Patient will benefit from skilled therapeutic intervention in order to improve the following deficits and impairments:     Visit Diagnosis: Abnormal posture  Lymphedema, not elsewhere classified  Chronic left  shoulder pain  Low back pain with sciatica, sciatica laterality unspecified, unspecified back pain laterality, unspecified chronicity  Stiffness of left shoulder joint     Problem List Patient Active Problem List   Diagnosis Date Noted  . Counseling regarding goals of care 04/16/2017  . Bone metastases ([place]) 02/28/2017  . Uncontrolled pain 02/28/2017  . Osteopenia determined by x-ray 09/20/2016  . Primary malignant neoplasm of left breast with stage 2 nodal metastasis per American Joint Committee on Cancer 7th edition (N2) ([place]) 02/14/2016  . Chemotherapy-induced neuropathy ([place]) 12/30/2015  . Insomnia 09/01/2015  . Malignant neoplasm of upper-outer quadrant of left breast in female, estrogen receptor positive ([place]) 08/08/2015   Donato Heinz. Owens Shark PT  Norwood Levo 05/09/2017, 3:56 PM  [place], [place], [address] Phone: [phone]   Fax:  [phone]  Name: BEVIN DAS MRN: [id number] Date of Birth: 1961/08/12

## 2017-05-09 NOTE — Patient Instructions (Signed)

## 2017-05-14 ENCOUNTER — Encounter: Payer: 59 | Admitting: Physical Therapy

## 2017-05-15 ENCOUNTER — Other Ambulatory Visit: Payer: Self-pay

## 2017-05-15 MED ORDER — OXYCODONE HCL 15 MG PO TABS: 15.0000 mg | ORAL_TABLET | ORAL | 0 refills | Status: DC | PRN

## 2017-05-16 ENCOUNTER — Ambulatory Visit: Payer: 59 | Admitting: Physical Therapy

## 2017-05-16 DIAGNOSIS — M25612 Stiffness of left shoulder, not elsewhere classified: Secondary | ICD-10-CM

## 2017-05-16 DIAGNOSIS — G8929 Other chronic pain: Secondary | ICD-10-CM

## 2017-05-16 DIAGNOSIS — M25512 Pain in left shoulder: Secondary | ICD-10-CM

## 2017-05-16 DIAGNOSIS — R293 Abnormal posture: Secondary | ICD-10-CM | POA: Diagnosis not present

## 2017-05-16 DIAGNOSIS — I890 Lymphedema, not elsewhere classified: Secondary | ICD-10-CM

## 2017-05-16 DIAGNOSIS — M5440 Lumbago with sciatica, unspecified side: Secondary | ICD-10-CM

## 2017-05-16 NOTE — Therapy (Signed)
[place], [place], [address] Phone: [phone]   Fax:  [phone]  Physical Therapy Treatment  Patient Details  Name: Alison [name] MRN: [id number] Date of Birth: 06-19-1961 Referring Provider: Dr. [name]  Encounter Date: 05/16/2017      PT End of Session - 05/16/17 1750    Visit Number 36   Number of Visits 55   Date for PT Re-Evaluation 06/26/17   PT Start Time 3016   PT Stop Time 1430   PT Time Calculation (min) 45 min   Activity Tolerance Patient tolerated treatment well   Behavior During Therapy [facility] for tasks assessed/performed      Past Medical History:  Diagnosis Date  . Anxiety   . Asthma     seasonal asthma  . Breast cancer ([place])    metastatic breast cancer  . Complication of anesthesia 2007   aspiration pneumonia post hysterectomy.  Blood pressure would drop low- but no problems 01/2016- surgery  . History of blood transfusion   . History of mitral valve prolapse 1988   with palpitations  . History of radiation therapy 04/02/16- 05/21/16   Left Chest Wall, SCV, Axilla, IM nodes  . Hypertension    no meds  . Neuropathy    with chemo  . Osteopenia determined by x-ray   . Pneumonia    1980's and 1990's  . Raynaud's disease   . 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 Skin cancer 03/12/2014   basal- back    Past Surgical History:  Procedure Laterality Date  . ABDOMINAL HYSTERECTOMY  2007   with BSO  . APPENDECTOMY  2007  . AXILLARY LYMPH NODE DISSECTION Left 02/14/2016   Procedure: LEFT AXILLARY LYMPH NODE DISSECTION;  Surgeon: [name], MD;  Location: [facility];  Service: General;  Laterality: Left;  . BREAST RECONSTRUCTION WITH PLACEMENT OF TISSUE EXPANDER AND FLEX HD (ACELLULAR HYDRATED DERMIS) Left 02/02/2016   Procedure: IMMEDIATE LEFT BREAST RECONSTRUCTION WITH PLACEMENT OF TISSUE EXPANDER AND FLEX HD (ACELLULAR HYDRATED DERMIS);   Surgeon: [name], DO;  Location: [facility];  Service: Plastics;  Laterality: Left;  . BREAST REDUCTION SURGERY Right 03/28/2017   Procedure: RIGHT MAMMARY REDUCTION  (BREAST);  Surgeon: [name], DO;  Location: [facility];  Service: Plastics;  Laterality: Right;  . BREAST SURGERY Left 02/02/16   mastectomy with reconstruction  . c-section  1991  . COLON RESECTION SIGMOID  12/02/2012   [facility]   . HERNIA REPAIR  2007  . LAPAROSCOPIC ENDOMETRIOSIS FULGURATION  1984, 1989  . MASTOPEXY Right 03/28/2017   Procedure: MASTOPEXY;  Surgeon: [name], DO;  Location: [facility];  Service: Plastics;  Laterality: Right;  . Port- a- cath placement  07/2015  . PORT-A-CATH REMOVAL Right 02/02/2016   Procedure: REMOVAL PORT-A-CATH;  Surgeon: [name], MD;  Location: [facility];  Service: General;  Laterality: Right;  . RADIOACTIVE SEED GUIDED AXILLARY SENTINEL LYMPH NODE Left 02/02/2016   Procedure: LEFT BREAST MASTECTOMY AND RADIOACTIVE SEED GUIDED LEFT SENTINEL LYMPH NODE BIOPSY ;  Surgeon: [name], MD;  Location: [facility];  Service: General;  Laterality: Left;  . REMOVAL OF TISSUE EXPANDER AND PLACEMENT OF IMPLANT Left 03/28/2017   Procedure: REMOVAL OF TISSUE EXPANDER AND PLACEMENT OF IMPLANT;  Surgeon: [name], DO;  Location: [facility];  Service: Plastics;  Laterality: Left;  There were no vitals filed for this visit.      Subjective Assessment - 05/16/17 1355    Subjective Pt states she has been wearing the circaid 2 hours a day . She feels that her arm is better    Pertinent History Breast cancer diagnosed November2016 She had chemotherapy. but did have some neuropathy so it had to be stopped. On June 8, she had a mastectomy with immedicate expander placement and with first set of lymphnodes removed, On June 20 she went for complete axillary lymph node  dissection. She says she has had always been "full necked" but has neck and upper body fullness since the cancer was diagnosed. She reports she has osteopenia in low back and has had a fracture that was discovered in 2014 when she had a Dexa scan. Pt has had metastasis to the bones of the spine and recent  bilateral breast surgery for a reduction on the right and expander removal on the left with implant placement and removal    Patient Stated Goals to decrease the pain    Currently in Pain? No/denies               LYMPHEDEMA/ONCOLOGY QUESTIONNAIRE - 05/16/17 1357      Left Upper Extremity Lymphedema   15 cm Proximal to Olecranon Process 30 cm   10 cm Proximal to Olecranon Process 27 cm   Olecranon Process 23.7 cm   15 cm Proximal to Ulnar Styloid Process 23.5 cm   10 cm Proximal to Ulnar Styloid Process 20.7 cm   Just Proximal to Ulnar Styloid Process 14.7 cm   Across Hand at [employer] 17.5 cm   At [place] of 2nd Digit 5.7 cm                  [facility] Adult PT Treatment/Exercise - 05/16/17 0001      Balance Poses: Yoga   Warrior I 2 reps;Limitations   Warrior I Limitations hands on hips    Tree Pose 2 reps;15 seconds;Limitations   Tree Pose Limitations other foot in "kickstand"      Lumbar Exercises: Standing   Heel Raises 5 reps   Row AROM;Right;Left   Row Limitations no theraband or external resisitance    Other Standing Lumbar Exercises gluteal and quad sets    Other Standing Lumbar Exercises standing with abds engaged, arms at sides      Manual Therapy   Manual Lymphatic Drainage (MLD) in supine short neck, superficial and deep abdominals, right axillary nodes, anterior interaxillary anastamosis, left arm to hand and return with extra time spent on forearm                    Short Term Clinic Goals - 05/16/17 1757      CC Short Term Goal  #1   Title Pt will report she knows strategies for good body mechanics to decrease low back pain    Time  4   Period Weeks   Status On-going     CC Short Term Goal  #2   Title Pt will have a deacrease in circumference 15 cm prox to ulnar styloid of left forearm by 1 cm    Baseline 25 cm , on 05/16/2017 it is 23.5   Status Achieved     CC Short Term Goal  #3   Title Pt will report she knows how to manage left UE lymphedema with use of compression, elevation and exercise, self manual lymph  drainage and possibly Flexitouch    Baseline 05/16/2017, has nighttime garment, daytime is ordered and is waiting on [place] - 05/16/17 1759      CC Long Term Goal  #1   Title Patient will report  that she can manage back pain  so she can perform daily activities with greater ease   Time 8   Period Weeks   Status On-going     CC Long Term Goal  #2   Title Pt will  have given the TENS unit a trial to see if it will help with back pain    Baseline has not needed to use TENS on 05/16/2017   Time 8   Period Weeks   Status On-going     CC Long Term Goal  #3   Title Pt will be independent in core and  strengthening exercises    Time 8   Period Weeks   Status On-going     CC Long Term Goal  #4   Title Pt will report low back pain is decreased to a 4/10 so that she can perform activities of daily living easier    Time 8   Period Weeks   Status On-going            Plan - 05/16/17 1751    Clinical Impression Statement Pt has had good reduction in LUE with use of circaid.  Her arm is much softer and has only minor increase from right arm. Pt is pleased with progress and is ready to progress with exercise.  Worked on standing exercise for stretching and stability today and pt had no increase in back pain    Clinical Impairments Affecting Rehab Potential chemotherapy with neuropathy , radiation to back    PT Treatment/Interventions ADLs/Self Care Home Management;Manual lymph drainage;Compression bandaging;Scar mobilization;Passive range of  motion;Patient/family education;Taping;Manual techniques;Therapeutic exercise;Therapeutic activities;Orthotic Fit/Training;DME Instruction;Electrical Stimulation;Moist Heat   PT Next Visit Plan  Monitor lymphedema, continue standing isometrics and progress core  general exercise for strengthening and pain managment with TENS as needed. MLD as needed for pain managment of left axillary discomfort    Consulted and Agree with Plan of Care Patient      Patient will benefit from skilled therapeutic intervention in order to improve the following deficits and impairments:  Decreased range of motion, Impaired UE functional use, Decreased activity tolerance, Decreased knowledge of precautions, Decreased skin integrity, Impaired perceived functional ability, Pain, Decreased knowledge of use of DME, Increased edema, Postural dysfunction, Decreased strength, Impaired sensation, Decreased scar mobility, Decreased mobility, Increased muscle spasms  Visit Diagnosis: Abnormal posture  Lymphedema, not elsewhere classified  Chronic left shoulder pain  Low back pain with sciatica, sciatica laterality unspecified, unspecified back pain laterality, unspecified chronicity  Stiffness of left shoulder joint     Problem List Patient Active Problem List   Diagnosis Date Noted  . Counseling regarding goals of care 04/16/2017  . Bone metastases ([place]) 02/28/2017  . Uncontrolled pain 02/28/2017  . Osteopenia determined by x-ray 09/20/2016  . Primary malignant neoplasm of left breast with stage 2 nodal metastasis per American Joint Committee on Cancer 7th edition (N2) ([place]) 02/14/2016  . Chemotherapy-induced neuropathy ([place]) 12/30/2015  . Insomnia 09/01/2015  . Malignant neoplasm of upper-outer quadrant of left breast in female, estrogen receptor positive ([place]) 08/08/2015   Donato Heinz. Owens Shark, PT  [place] Levo 05/16/2017, 6:00  PM  [place], [place], [address] Phone: [phone]   Fax:  [phone]  Name: Alison [name] MRN: [id number] Date of Birth: 1960/10/03

## 2017-05-21 ENCOUNTER — Ambulatory Visit (HOSPITAL_BASED_OUTPATIENT_CLINIC_OR_DEPARTMENT_OTHER): Payer: 59 | Admitting: Adult Health

## 2017-05-21 ENCOUNTER — Encounter: Payer: Self-pay | Admitting: Adult Health

## 2017-05-21 ENCOUNTER — Telehealth: Payer: Self-pay

## 2017-05-21 ENCOUNTER — Ambulatory Visit (HOSPITAL_BASED_OUTPATIENT_CLINIC_OR_DEPARTMENT_OTHER): Payer: 59

## 2017-05-21 ENCOUNTER — Other Ambulatory Visit (HOSPITAL_BASED_OUTPATIENT_CLINIC_OR_DEPARTMENT_OTHER): Payer: 59

## 2017-05-21 VITALS — BP 143/76 | HR 73 | Temp 98.4°F | Resp 18 | Ht 62.0 in | Wt 146.0 lb

## 2017-05-21 VITALS — BP 153/76 | HR 67 | Temp 98.4°F | Resp 16

## 2017-05-21 DIAGNOSIS — Z170 Estrogen receptor positive status [ER+]: Secondary | ICD-10-CM | POA: Diagnosis not present

## 2017-05-21 DIAGNOSIS — Z5111 Encounter for antineoplastic chemotherapy: Secondary | ICD-10-CM | POA: Diagnosis not present

## 2017-05-21 DIAGNOSIS — C773 Secondary and unspecified malignant neoplasm of axilla and upper limb lymph nodes: Secondary | ICD-10-CM

## 2017-05-21 DIAGNOSIS — C50412 Malignant neoplasm of upper-outer quadrant of left female breast: Secondary | ICD-10-CM

## 2017-05-21 DIAGNOSIS — M8580 Other specified disorders of bone density and structure, unspecified site: Secondary | ICD-10-CM

## 2017-05-21 DIAGNOSIS — C779 Secondary and unspecified malignant neoplasm of lymph node, unspecified: Principal | ICD-10-CM

## 2017-05-21 DIAGNOSIS — C50912 Malignant neoplasm of unspecified site of left female breast: Secondary | ICD-10-CM

## 2017-05-21 DIAGNOSIS — G893 Neoplasm related pain (acute) (chronic): Secondary | ICD-10-CM | POA: Diagnosis not present

## 2017-05-21 LAB — COMPREHENSIVE METABOLIC PANEL
ALT: 69 U/L — AB (ref 0–55)
AST: 81 U/L — ABNORMAL HIGH (ref 5–34)
Albumin: 3.4 g/dL — ABNORMAL LOW (ref 3.5–5.0)
Alkaline Phosphatase: 69 U/L (ref 40–150)
Anion Gap: 7 mEq/L (ref 3–11)
BILIRUBIN TOTAL: 0.42 mg/dL (ref 0.20–1.20)
BUN: 14.6 mg/dL (ref 7.0–26.0)
CO2: 25 meq/L (ref 22–29)
CREATININE: 0.7 mg/dL (ref 0.6–1.1)
Calcium: 9.6 mg/dL (ref 8.4–10.4)
Chloride: 105 mEq/L (ref 98–109)
EGFR: >90 mL/min/{1.73_m2} (ref >90)
GLUCOSE: 85 mg/dL (ref 70–140)
Potassium: 4.2 mEq/L (ref 3.5–5.1)
SODIUM: 137 meq/L (ref 136–145)
TOTAL PROTEIN: 6.7 g/dL (ref 6.4–8.3)

## 2017-05-21 LAB — CBC WITH DIFFERENTIAL/PLATELET
BASO%: 1.5 % (ref 0.0–2.0)
Basophils Absolute: 0 10*3/uL (ref 0.0–0.1)
EOS%: 6.6 % (ref 0.0–7.0)
Eosinophils Absolute: 0.2 10*3/uL (ref 0.0–0.5)
HCT: 35.6 % (ref 34.8–46.6)
HEMOGLOBIN: 12 g/dL (ref 11.6–15.9)
LYMPH%: 17.9 % (ref 14.0–49.7)
MCH: 34.1 pg — ABNORMAL HIGH (ref 25.1–34.0)
MCHC: 33.7 g/dL (ref 31.5–36.0)
MCV: 101 fL (ref 79.5–101.0)
MONO#: 0.3 10*3/uL (ref 0.1–0.9)
MONO%: 11.4 % (ref 0.0–14.0)
NEUT%: 62.6 % (ref 38.4–76.8)
NEUTROS ABS: 1.6 10*3/uL (ref 1.5–6.5)
PLATELETS: 230 10*3/uL (ref 145–400)
RBC: 3.52 10*6/uL — AB (ref 3.70–5.45)
RDW: 16.1 % — AB (ref 11.2–14.5)
WBC: 2.6 10*3/uL — AB (ref 3.9–10.3)
lymph#: 0.5 10*3/uL — ABNORMAL LOW (ref 0.9–3.3)

## 2017-05-21 MED ORDER — ONDANSETRON HCL 8 MG PO TABS: 8.0000 mg | ORAL_TABLET | Freq: Three times a day (TID) | ORAL | 0 refills | Status: DC | PRN

## 2017-05-21 MED ORDER — OXYCODONE HCL ER 80 MG PO T12A: 80.0000 mg | EXTENDED_RELEASE_TABLET | Freq: Three times a day (TID) | ORAL | 0 refills | Status: DC

## 2017-05-21 MED ORDER — DENOSUMAB 120 MG/1.7ML ~~LOC~~ SOLN
120.0000 mg | Freq: Once | SUBCUTANEOUS | Status: AC
  Administered 2017-05-21: 120 mg via SUBCUTANEOUS
  Filled 2017-05-21: qty 1.7

## 2017-05-21 MED ORDER — FULVESTRANT 250 MG/5ML IM SOLN
500.0000 mg | Freq: Once | INTRAMUSCULAR | Status: AC
  Administered 2017-05-21: 500 mg via INTRAMUSCULAR
  Filled 2017-05-21: qty 10

## 2017-05-21 NOTE — Telephone Encounter (Signed)
Called patient to inform her of skeletal survey appt scheduled for 06/14/17 at 11 am before bone scan. Patient voiced understanding.

## 2017-05-21 NOTE — Progress Notes (Signed)
[name]  [phone]) [phone] [phone]) [phone]   ID: Alison [name] DOB: [birth year]  MR#: [id number]  AVW#:[id number]  Patient Care Team: [name], MD as PCP - General (Internal Medicine) Erroll Luna, MD as Consulting Physician (General Surgery) Magrinat, Virgie Dad, MD as Consulting Physician (Oncology) Aloha Gell, MD as Consulting Physician (Obstetrics and Gynecology) Harriett Sine, MD as Consulting Physician (Dermatology) Benson Norway, RN as Registered Nurse (Oncology) Eppie Gibson, MD as Attending Physician (Radiation Oncology) PCP: [name], MD OTHER MD:  CHIEF COMPLAINT: Estrogen receptor positive breast cancer  CURRENT TREATMENT: Anastrozole, fulvestrant, abemaciclib, denosumab/Xgeva  INTERVAL HISTORY: Alison [name] is here today for follow up of her metastatic breast cancer.  She is doing well today.  She did go down to [facility] and get a second opinion.  She is taking the above medications and tolerating them well for the most part.    She tolerates the denosumab/Xgeva and fulvestrant without any complications.  REVIEW OF SYSTEMS: Alison [name] has noted some mild nausea after taking her medications.  Now at her last appointment, her oxycontin was increased to 74m po TID.  Her pain is more manageable, but she is taking a lot of pills at one time and is subsequently nauseated.  She has changed how many pills she will take at once and that has helped, in addition to take Ondansetron.  She denies any other issues or concerns today and a detailed ROS is non contributory.    BREAST CANCER HISTORY: From the original intake note:  LEmmanuelhad routine screening mammography with [facility] February 20 11/14/2014 showing a possible mass in the right breast. Diagnostic right mammography with right breast ultrasonography 11/01/2014 found the breast density to be category C. In the upper outer quadrant of the right breast  there was a partially obscured mass which on physical exam was palpable as an area of thickening at the 10:00 position 8 cm from the nipple. Ultrasound showed multiple cysts in the upper outer right breast corresponding to the mass in question.  In November 2016 the patient felt a change in the upper left breast and brought it to her primary care physician's attention. Diagnostic left mammography with tomosynthesis and left breast ultrasonography at [facility] December 01/14/2015 found trabecular thickening throughout the left breast with skin thickening, but no circumscribed mass or suspicious calcifications. A rounded mass in the left axilla measured 6 mm. There was an additional mildly prominent lymph node in the left axilla which was what the patient had been palpating. Ultrasonography showed ill-defined swelling throughout the inner left breast with overlying skin thickening.. At the 2:00 position 4 cm from the nipple there was an irregular hypoechoic mass measuring 2.3 cm. A second mass at the 1:00 area 3 cm from the nipple measured 1 cm. The distance between these 2 masses was 1.8 cm. There was also a round hypoechoic left axillary mass measuring 0.6 cm. Overall the was an area of 3.7 cm of mixed echogenicity corresponding to the area of palpable soft tissue swelling.   On 08/04/2015 the patient underwent biopsy of the mass at the 2:00 axis 4 cm from the nipple and a second biopsy was performed of the hypoechoic mass at the 1:00 position 3 cm from the nipple. Biopsy of the suspicious nodule in the lower left axilla was attempted but could not be completed as the mass became obscured after administration of lidocaine.  The pathology from both left breast biopsies  08/04/2015 ([id number]) showed invasive ductal carcinoma, grade 3. The prognostic profile obtained from the larger tumor showed estrogen receptor to be strongly positive at 95%, progesterone receptor positive at 30%, with an MIB-1 of  12%, and no HER-2 amplification, the signals ratio being 1.22 and the number per cell 2.20.  On 08/09/2015 the patient underwent biopsy of this suspicious left axillary lymph node noted above, and this showed ([id number]) metastatic carcinoma with extracapsular extension.  The patient's subsequent history is as detailed below    PAST MEDICAL HISTORY: Past Medical History:  Diagnosis Date  . Anxiety   . Asthma     seasonal asthma  . Breast cancer ([place])    metastatic breast cancer  . Complication of anesthesia 2007   aspiration pneumonia post hysterectomy.  Blood pressure would drop low- but no problems 01/2016- surgery  . History of blood transfusion   . History of mitral valve prolapse 1988   with palpitations  . History of radiation therapy 04/02/16- 05/21/16   Left Chest Wall, SCV, Axilla, IM nodes  . Hypertension    no meds  . Neuropathy    with chemo  . Osteopenia determined by x-ray   . Pneumonia    1980's and 1990's  . Raynaud's disease   . 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 Skin cancer 03/12/2014   basal- back    PAST SURGICAL HISTORY: Past Surgical History:  Procedure Laterality Date  . ABDOMINAL HYSTERECTOMY  2007   with BSO  . APPENDECTOMY  2007  . AXILLARY LYMPH NODE DISSECTION Left 02/14/2016   Procedure: LEFT AXILLARY LYMPH NODE DISSECTION;  Surgeon: [name], MD;  Location: [facility];  Service: General;  Laterality: Left;  . BREAST RECONSTRUCTION WITH PLACEMENT OF TISSUE EXPANDER AND FLEX HD (ACELLULAR HYDRATED DERMIS) Left 02/02/2016   Procedure: IMMEDIATE LEFT BREAST RECONSTRUCTION WITH PLACEMENT OF TISSUE EXPANDER AND FLEX HD (ACELLULAR HYDRATED DERMIS);  Surgeon: [name], DO;  Location: [facility];  Service: Plastics;  Laterality: Left;  . BREAST REDUCTION SURGERY Right 03/28/2017   Procedure: RIGHT MAMMARY REDUCTION  (BREAST);  Surgeon: [name], DO;  Location: [facility];  Service: Plastics;  Laterality: Right;  . BREAST SURGERY Left 02/02/16   mastectomy with reconstruction  . c-section  1991  . COLON RESECTION SIGMOID  12/02/2012   [facility]   . HERNIA REPAIR  2007  . LAPAROSCOPIC ENDOMETRIOSIS FULGURATION  1984, 1989  . MASTOPEXY Right 03/28/2017   Procedure: MASTOPEXY;  Surgeon: [name], DO;  Location: [facility];  Service: Plastics;  Laterality: Right;  . Port- a- cath placement  07/2015  . PORT-A-CATH REMOVAL Right 02/02/2016   Procedure: REMOVAL PORT-A-CATH;  Surgeon: [name], MD;  Location: [facility];  Service: General;  Laterality: Right;  . RADIOACTIVE SEED GUIDED AXILLARY SENTINEL LYMPH NODE Left 02/02/2016   Procedure: LEFT BREAST MASTECTOMY AND RADIOACTIVE SEED GUIDED LEFT SENTINEL LYMPH NODE BIOPSY ;  Surgeon: [name], MD;  Location: [facility];  Service: General;  Laterality: Left;  . REMOVAL OF TISSUE EXPANDER AND PLACEMENT OF IMPLANT Left 03/28/2017   Procedure: REMOVAL OF TISSUE EXPANDER AND PLACEMENT OF IMPLANT;  Surgeon: [name], DO;  Location: [facility];  Service: Plastics;  Laterality: Left;    FAMILY HISTORY Family History  Problem Relation Age of Onset  . Lung cancer Father    the patient's father died  from lung cancer at the age of 42 in the setting of tobacco abuse. The patient's mother died at the age of 77 with pancreatic cancer. The patient had no siblings. There is no history of breast or ovarian cancer in the family.  GYNECOLOGIC HISTORY:  No LMP recorded. Patient has had a hysterectomy. Menarche age 20, first live birth age 85. The patient is GX P1. She is status post total hysterectomy with bilateral salpingo-oophorectomy. She has been on hormone replacement since that time and is tapering to off at the time of this dictation.   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 She used to work at [employer]. Her husband Alison [name]  works for [employer] division at a [employer] their son Alison [name] is currently at home.    ADVANCED DIRECTIVES: Not in place   HEALTH MAINTENANCE: Social History  Substance Use Topics  . Smoking status: Former Smoker    Packs/day: 1.00    Years: 10.00    Types: Cigarettes    Quit date: 11/25/2008  . Smokeless tobacco: Never Used     Comment: on and off smoker never consistent  . Alcohol use Yes     Comment: social     Colonoscopy: April 2014/ Raynaldo Opitz at [facility]  PAP: s/p hysterectomy  Bone density: August 2016/ osteopenia  Lipid panel:  Allergies  Allergen Reactions  . Sulfa Antibiotics Other (See Comments)    Blisters in mouth    Current Outpatient Prescriptions  Medication Sig Dispense Refill  . Abemaciclib 100 MG TABS Take 100 mg by mouth 2 (two) times daily. 60 tablet 3  . anastrozole (ARIMIDEX) 1 MG tablet Take 1 mg by mouth daily.    . Ascorbic Acid (VITAMIN C) 1000 MG tablet Take 1,000 mg by mouth 2 (two) times daily.    . cetirizine (ZYRTEC) 10 MG tablet Take 10 mg by mouth daily.    . Cholecalciferol (VITAMIN D3) 2000 UNITS capsule Take 2,000 Units by mouth daily.     Marland Kitchen escitalopram (LEXAPRO) 10 MG tablet Take 1 tablet (10 mg total) by mouth daily. 30 tablet 12  . fluticasone (FLONASE) 50 MCG/ACT nasal spray Place 2 sprays into both nostrils daily. Reported on 11/18/2015    . gabapentin (NEURONTIN) 300 MG capsule Take 1 capsule (300 mg total) by mouth at bedtime as needed. Take 2 tablets in AM, 2 tablets mid-day, and 4 tablets at bedtime 90 capsule 4  . LORazepam (ATIVAN) 0.5 MG tablet Take 1 tablet (0.5 mg total) by mouth at bedtime. 30 tablet 3  . Multiple Vitamin (MULTIVITAMIN) tablet Take 1 tablet by mouth daily.    . ondansetron (ZOFRAN) 8 MG tablet Take 1 tablet (8 mg total) by mouth every 8 (eight) hours as needed for nausea or vomiting. 20 tablet 0  . oxyCODONE (OXYCONTIN) 80 mg 12 hr tablet Take 1 tablet (80 mg total) by mouth every 8 (eight)  hours. 90 tablet 0  . oxyCODONE (ROXICODONE) 15 MG immediate release tablet Take 1-2 tablets (15-30 mg total) by mouth every 4 (four) hours as needed for pain. 90 tablet 0  . Probiotic Product (PROBIOTIC DAILY PO) Take 1 capsule by mouth daily. [place] prochlorperazine (COMPAZINE) 10 MG tablet Take 1 tablet (10 mg total) by mouth every 6 (six) hours as needed (Nausea or vomiting). 30 tablet 2  . traZODone (DESYREL) 50 MG tablet TAKE 3 TABLETS AT BEDTIME 90 tablet 2  .  zolpidem (AMBIEN) 5 MG tablet Take 1 tablet (5 mg total) by mouth at bedtime as needed for sleep. for sleep 30 tablet 2   No current facility-administered medications for this visit.     OBJECTIVE:   Vitals:   05/21/17 1259  BP: (!) 143/76  Pulse: 73  Resp: 18  Temp: 98.4 F (36.9 C)  SpO2: 100%     Body mass index is 26.7 kg/m.    ECOG FS:2 - Symptomatic, <50% confined to bed GENERAL: Patient is a well appearing female in no acute distress HEENT:  Sclerae anicteric.  Oropharynx clear and moist. No ulcerations or evidence of oropharyngeal candidiasis. Neck is supple.  NODES:  No cervical, supraclavicular, or axillary lymphadenopathy palpated.  BREAST EXAM:  Deferred. LUNGS:  Clear to auscultation bilaterally.  No wheezes or rhonchi. HEART:  Regular rate and rhythm. No murmur appreciated. ABDOMEN:  Soft, nontender.  Positive, normoactive bowel sounds. No organomegaly palpated. MSK:  No focal spinal tenderness to palpation. Full range of motion bilaterally in the upper extremities. EXTREMITIES:  No peripheral edema.   SKIN:  Clear with no obvious rashes or skin changes. No nail dyscrasia. NEURO:  Nonfocal. Well oriented.  Appropriate affect.      LAB RESULTS:  CMP     Component Value Date/Time   NA 137 04/23/2017 1342   K 4.5 04/23/2017 1342   CL 110 02/15/2016 0330   CO2 25 04/23/2017 1342   GLUCOSE 109 04/23/2017 1342   BUN 5.6 (L) 04/23/2017 1342   CREATININE  0.7 04/23/2017 1342   CALCIUM 8.6 04/23/2017 1342   PROT 6.4 04/23/2017 1342   ALBUMIN 3.4 (L) 04/23/2017 1342   AST 29 04/23/2017 1342   ALT 17 04/23/2017 1342   ALKPHOS 72 04/23/2017 1342   BILITOT 0.36 04/23/2017 1342   GFRNONAA >60 02/15/2016 0330   GFRAA >60 02/15/2016 0330    INo results found for: SPEP, UPEP  Lab Results  Component Value Date   WBC 2.6 (L) 05/21/2017   NEUTROABS 1.6 05/21/2017   HGB 12.0 05/21/2017   HCT 35.6 05/21/2017   MCV 101.0 05/21/2017   PLT 230 05/21/2017      Chemistry      Component Value Date/Time   NA 137 04/23/2017 1342   K 4.5 04/23/2017 1342   CL 110 02/15/2016 0330   CO2 25 04/23/2017 1342   BUN 5.6 (L) 04/23/2017 1342   CREATININE 0.7 04/23/2017 1342      Component Value Date/Time   CALCIUM 8.6 04/23/2017 1342   ALKPHOS 72 04/23/2017 1342   AST 29 04/23/2017 1342   ALT 17 04/23/2017 1342   BILITOT 0.36 04/23/2017 1342       No results found for: LABCA2  No components found for: LABCA125  No results for input(s): INR in the last 168 hours.  Urinalysis    Component Value Date/Time   COLORURINE YELLOW 11/12/2015 [place] 11/12/2015 0837   LABSPEC 1.005 03/18/2017 1223   PHURINE 6.0 03/18/2017 1223   PHURINE 6.5 11/12/2015 0837   GLUCOSEU Negative 03/18/2017 1223   HGBUR Large 03/18/2017 1223   HGBUR MODERATE (A) 11/12/2015 0837   BILIRUBINUR Negative 03/18/2017 1223   KETONESUR Negative 03/18/2017 1223   [place] 11/12/2015 0837   PROTEINUR Negative 03/18/2017 1223   PROTEINUR NEGATIVE 11/12/2015 0837   UROBILINOGEN 0.2 03/18/2017 1223   NITRITE Negative 03/18/2017 1223   NITRITE NEGATIVE 11/12/2015 0837   LEUKOCYTESUR Moderate 03/18/2017 1223  STUDIES: No results found.   PROTOCOLS: BWEL, ABC and PALLAS, [place] study  ASSESSMENT: 56 y.o. [place] woman status post biopsy of 2 separate masses in the left breast upper outer quadrant 08/04/2015, clinically mT2 N1, stage  IIb/IIIa invasive ductal carcinoma, grade 3, the larger mass being estrogen and progesterone receptor positive, HER-2 negative, with an MIB-1 of 12%  (a) biopsy of a left axillary lymph node 08/09/2015 was positive, with extracapsular extension  (1) neoadjuvant chemotherapy started 08/25/2015 consisting of doxorubicin and cyclophosphamide in dose dense fashion 4, completed 10/06/2015,  followed by weekly paclitaxel 10 completed 12/23/2015  (a) final 2 planned cycles of weekly paclitaxel omitted because of neuropathy concerns  (2) status post left mastectomy with targeted axillary dissection 02/02/2016 for a ypT2 ypN2a, stage IIIa invasive ductal carcinoma, grade 2, with negative margins, repeat prognostic panel showing estrogen receptor positive, but progesterone receptor and HER-2 negative  (a) completion axillary lymph node dissection 02/14/2016 showed an additional 2 out of 8 lymph nodes to be involved (final count 6 out of 10 lymph nodes positive  (b) expander placement at the time of left mastectomy  (3) adjuvant capecitabine starting concurrently with radiation--discontinued 05/21/2016  (4) adjuvant  radiation started 04/02/2016, completed 05/21/2016  (5) anastrozole started October 2017  (a) bone density August 2016 showed osteopenia  (b) repeat bone density 09/07/2016 shows a T score of -1.7.  (c) the patient is status post total abdominal hysterectomy with bilateral salpingo-oophorectomy.  (6) postoperative pain?: Starting OxyContin 20 mg twice a day with oxycodone as needed for breakthrough pain 03/02/2016  (a) failed transition to tramadol, gabapentin, naproxen   (b) started methadone 5 mg TID as of 12/20/2016-- not tolerated  (c) robaxin added June 2018 to NSAIDS and tylenol  (7) Research:  (a) B-WEL study ([facility] [id number])--enrolled in Arm 1 (received education)  (b) PALLAS AFT-05 study: randomized to treatment, started palbociclib 07/26/2016   (i) dose reduced to 100 mg,  then to 75 mg because of cytopenias   (ii) went off study study May 2018 because of persistent cytopenias  (c) taxane study 12/20/2016  (d) aspirin study  METASTATIC DISEASE 02/15/2017: CT scan of the lumbar spine and 02/27/2017 CT of the thoracic and cervical spine shows multiple bone lesions but no evidence of cord compromise; bone lesions are confirmed on PET scan 02/22/2017 which however shows no evidence of visceral disease  (a) CA-27-29 is noninformative  (8) continuing anastrozole, abemaciclib added 02/22/2017, fulvestrant started 02/22/2017 ,   (9) yearly zolendronate given 09/26/2016, changed to monthly denosumab/Xgeva starting 03/19/2017  (10) radiation to be completed 04/23/2017  Other issues: (a) poorly controlled pain: Currently on OxyContin 80 mg po TID/ oxycodone 15-30 mg Q 4h PRN  (i) bowel prophylaxis in place  (b) left upper lobe changes noted on PET scan 02/22/2017 felt to be secondary to radiation  (c) [facility] review results pending    PLAN: Alison [name] is doing well today.  Her labs are stable today and she will continue with her treatment.  She is tolerating it well.  She will continue using Zofran for nausea and I refilled this for her today.  Her pain is under control with her current regimen and I re-filled her Oxycontin today as well.     She is due for repeat imaging soon.  She has the bone scan scheduled, but I'm not sure about the skeletal survey.  We will call and ask.  I did inform her that she can go and have the chest xray  done on a walk in basis at radiology.  We spent a good deal of time discussing her treatment and treatment plans.    Her next appointment is on 10/24 with Dr. [name].  She will review her imaging results at that time.  I reviewed everything with her in detail and she verbalized understanding of the plan.  She knows to call for any other issues that may develop before the next visit here.  A total of (30) minutes of face-to-face time  was spent with this patient with greater than 50% of that time in counseling and care-coordination.    Scot Dock, NP   05/21/2017 1:06 PM

## 2017-05-21 NOTE — Progress Notes (Signed)
Spoke with Kennith Center PharmD regarding elevated liver enzymes. Advises ok to proceed with Faslodex and Xgeva.

## 2017-05-22 ENCOUNTER — Telehealth: Payer: Self-pay | Admitting: Pharmacist

## 2017-05-22 NOTE — Telephone Encounter (Signed)
Oral Chemotherapy Pharmacist Encounter  Received notification from Wilber Bihari, NP that Alison [name] was interested in exploring "complimentary therapy" that she wanted to discuss with oral chemo pharmacist.   I gave Alison [name] a call today and we spoke about her interest in using essential oils. She stated she found a book on [place] about Essential oils in breast cancer that she ordered and that on a business trip her husband ran into some who had positive things to say about using essential oils in breast cancer patients. She has not started using them yet but she is thinking about it.  We talked about how it will be dependent on what oils she uses whether there will be a concern for it affect her breast cancer. We also discussed how much of the time there is little study information on the use of essential oils.  During this conversion we discussed lavender and tea tree oil. Per [facility] website, Laurina Bustle may have some activity at estrogen receptors and there are case reports of tea tree oil causing breast enhancement. Instructed the patient on how to assess the [facility] [employer] website and told her to give use a call once she knows what exact oils she is interested in trying if she has any questions.  Thank you,  Darl Pikes, PharmD, BCPS Hematology/Oncology Clinical Pharmacist [facility] [phone]  05/22/2017 10:07 AM

## 2017-05-23 MED FILL — VERZENIO 100 MG TAB: 100 | 28 days supply | Qty: 56 | Fill #3

## 2017-05-27 ENCOUNTER — Other Ambulatory Visit: Payer: Self-pay | Admitting: Oncology

## 2017-05-27 ENCOUNTER — Other Ambulatory Visit: Payer: Self-pay | Admitting: *Deleted

## 2017-05-27 DIAGNOSIS — Z1231 Encounter for screening mammogram for malignant neoplasm of breast: Secondary | ICD-10-CM

## 2017-05-27 DIAGNOSIS — Z9012 Acquired absence of left breast and nipple: Secondary | ICD-10-CM

## 2017-05-27 MED ORDER — OXYCODONE HCL 15 MG PO TABS: 15.0000 mg | ORAL_TABLET | ORAL | 0 refills | Status: DC | PRN

## 2017-05-28 ENCOUNTER — Ambulatory Visit: Payer: 59 | Attending: General Surgery | Admitting: Physical Therapy

## 2017-05-28 DIAGNOSIS — M79601 Pain in right arm: Secondary | ICD-10-CM | POA: Insufficient documentation

## 2017-05-28 DIAGNOSIS — I890 Lymphedema, not elsewhere classified: Secondary | ICD-10-CM

## 2017-05-28 DIAGNOSIS — M25512 Pain in left shoulder: Secondary | ICD-10-CM | POA: Diagnosis present

## 2017-05-28 DIAGNOSIS — R293 Abnormal posture: Secondary | ICD-10-CM | POA: Insufficient documentation

## 2017-05-28 DIAGNOSIS — M5440 Lumbago with sciatica, unspecified side: Secondary | ICD-10-CM | POA: Insufficient documentation

## 2017-05-28 DIAGNOSIS — G8929 Other chronic pain: Secondary | ICD-10-CM

## 2017-05-28 DIAGNOSIS — M25612 Stiffness of left shoulder, not elsewhere classified: Secondary | ICD-10-CM | POA: Diagnosis present

## 2017-05-28 NOTE — Therapy (Signed)
[place], [place], [address] Phone: [phone]   Fax:  [phone]  Physical Therapy Treatment  Patient Details  Name: Alison [name] MRN: [id number] Date of Birth: [birth year] Referring Provider: Dr. [name]  Encounter Date: 05/28/2017      PT End of Session - 05/28/17 1624    Visit Number 37   Number of Visits 55   Date for PT Re-Evaluation 06/26/17   PT Start Time 1430   PT Stop Time 1515   PT Time Calculation (min) 45 min   Activity Tolerance Patient tolerated treatment well   Behavior During Therapy [facility] for tasks assessed/performed      Past Medical History:  Diagnosis Date  . Anxiety   . Asthma     seasonal asthma  . Breast cancer ([place])    metastatic breast cancer  . Complication of anesthesia 2007   aspiration pneumonia post hysterectomy.  Blood pressure would drop low- but no problems 01/2016- surgery  . History of blood transfusion   . History of mitral valve prolapse 1988   with palpitations  . History of radiation therapy 04/02/16- 05/21/16   Left Chest Wall, SCV, Axilla, IM nodes  . Hypertension    no meds  . Neuropathy    with chemo  . Osteopenia determined by x-ray   . Pneumonia    1980's and 1990's  . Raynaud's disease   . 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 Skin cancer 03/12/2014   basal- back    Past Surgical History:  Procedure Laterality Date  . ABDOMINAL HYSTERECTOMY  2007   with BSO  . APPENDECTOMY  2007  . AXILLARY LYMPH NODE DISSECTION Left 02/14/2016   Procedure: LEFT AXILLARY LYMPH NODE DISSECTION;  Surgeon: [name], MD;  Location: [place];  Service: General;  Laterality: Left;  . BREAST RECONSTRUCTION WITH PLACEMENT OF TISSUE EXPANDER AND FLEX HD (ACELLULAR HYDRATED DERMIS) Left 02/02/2016   Procedure: IMMEDIATE LEFT BREAST RECONSTRUCTION WITH PLACEMENT OF TISSUE EXPANDER AND FLEX HD (ACELLULAR HYDRATED DERMIS);   Surgeon: [name], DO;  Location: [place];  Service: Plastics;  Laterality: Left;  . BREAST REDUCTION SURGERY Right 03/28/2017   Procedure: RIGHT MAMMARY REDUCTION  (BREAST);  Surgeon: [name], DO;  Location: [place];  Service: Plastics;  Laterality: Right;  . BREAST SURGERY Left 02/02/16   mastectomy with reconstruction  . c-section  1991  . COLON RESECTION SIGMOID  12/02/2012   [facility]   . HERNIA REPAIR  2007  . LAPAROSCOPIC ENDOMETRIOSIS FULGURATION  1984, 1989  . MASTOPEXY Right 03/28/2017   Procedure: MASTOPEXY;  Surgeon: [name], DO;  Location: [place];  Service: Plastics;  Laterality: Right;  . Port- a- cath placement  07/2015  . PORT-A-CATH REMOVAL Right 02/02/2016   Procedure: REMOVAL PORT-A-CATH;  Surgeon: [name], MD;  Location: [place];  Service: General;  Laterality: Right;  . RADIOACTIVE SEED GUIDED AXILLARY SENTINEL LYMPH NODE Left 02/02/2016   Procedure: LEFT BREAST MASTECTOMY AND RADIOACTIVE SEED GUIDED LEFT SENTINEL LYMPH NODE BIOPSY ;  Surgeon: [name], MD;  Location: [place];  Service: General;  Laterality: Left;  . REMOVAL OF TISSUE EXPANDER AND PLACEMENT OF IMPLANT Left 03/28/2017   Procedure: REMOVAL OF TISSUE EXPANDER AND PLACEMENT OF IMPLANT;  Surgeon: [name], DO;  Location: [place];  Service: Plastics;  Laterality: Left;  There were no vitals filed for this visit.      Subjective Assessment - 05/28/17 1434    Subjective Pt states she has had a sore throat for a couple of days.  She will call Dr. [name] if she is not feeling better tomorrow.  She got her bras with prosthesis. and is doing well with that.  She has been doing her standing isometrics and doing well with those.    Pertinent History Breast cancer diagnosed November2016 She had chemotherapy. but did have some neuropathy so it had to be stopped. On  June 8, she had a mastectomy with immedicate expander placement and with first set of lymphnodes removed, On June 20 she went for complete axillary lymph node dissection. She says she has had always been "full necked" but has neck and upper body fullness since the cancer was diagnosed. She reports she has osteopenia in low back and has had a fracture that was discovered in 2014 when she had a Dexa scan. Pt has had metastasis to the bones of the spine and recent  bilateral breast surgery for a reduction on the right and expander removal on the left with implant placement and removal    Patient Stated Goals to decrease the pain    Currently in Pain? Yes   Pain Score 2    Pain Location Axilla   Pain Orientation Left   Pain Descriptors / Indicators Aching                         [facility] Adult PT Treatment/Exercise - 05/28/17 0001      Self-Care   Other Self-Care Comments  provided small piece of white foam for pt to wear under bra on left so it does not irritate skin      Lumbar Exercises: Supine   Bridge 10 reps   Bridge Limitations did not lift hips off bed    Straight Leg Raise 5 reps   Straight Leg Raises Limitations each leg, squeeze, raise, lower, relax    Other Supine Lumbar Exercises prepilates series  10 reps each      Lumbar Exercises: Sidelying   Hip Abduction 5 reps     Manual Therapy   Manual Lymphatic Drainage (MLD) in supine short neck, superficial and deep abdominals, right axillary nodes, anterior interaxillary anastamosis, left arm to hand and return with extra time spent on forearm                 PT Education - 05/28/17 1623    Education provided Yes   Education Details prepilates exercise series    Person(s) Educated Patient   Methods Explanation;Demonstration   Comprehension Verbalized understanding;Returned demonstration           Short Term Clinic Goals - 05/16/17 1757      CC Short Term Goal  #1   Title Pt will report she knows  strategies for good body mechanics to decrease low back pain    Time 4   Period Weeks   Status On-going     CC Short Term Goal  #2   Title Pt will have a deacrease in circumference 15 cm prox to ulnar styloid of left forearm by 1 cm    Baseline 25 cm , on 05/16/2017 it is 23.5   Status Achieved     CC Short Term Goal  #3   Title Pt will report she knows how to manage left UE lymphedema with use  of compression, elevation and exercise, self manual lymph drainage and possibly Flexitouch    Baseline 05/16/2017, has nighttime garment, daytime is ordered and is waiting on [place] - 05/16/17 1759      CC Long Term Goal  #1   Title Patient will report  that she can manage back pain  so she can perform daily activities with greater ease   Time 8   Period Weeks   Status On-going     CC Long Term Goal  #2   Title Pt will  have given the TENS unit a trial to see if it will help with back pain    Baseline has not needed to use TENS on 05/16/2017   Time 8   Period Weeks   Status On-going     CC Long Term Goal  #3   Title Pt will be independent in core and  strengthening exercises    Time 8   Period Weeks   Status On-going     CC Long Term Goal  #4   Title Pt will report low back pain is decreased to a 4/10 so that she can perform activities of daily living easier    Time 8   Period Weeks   Status On-going            Plan - 05/28/17 1624    Clinical Impression Statement Pt appears to be doing well. Her arm has stabalized and she is not having back pain. She is doing standing lumbar isometrics and progressed to supine and sidelying exercise today.  She has received prosthesis and bra and seems to be doing well with that    Clinical Impairments Affecting Rehab Potential chemotherapy with neuropathy , radiation to back    PT Treatment/Interventions ADLs/Self Care Home Management;Manual lymph drainage;Compression bandaging;Scar  mobilization;Passive range of motion;Patient/family education;Taping;Manual techniques;Therapeutic exercise;Therapeutic activities;Orthotic Fit/Training;DME Instruction;Electrical Stimulation;Moist Heat   PT Next Visit Plan  Standing rows with theraband. standing hip lifts.  mini squats with arms overheald, sidestepping with squats. changing speeds while walking. try treadmill. Monitor lymphedema, continue standing isometrics and progress core  general exercise for strengthening and pain managment with TENS as needed. MLD as needed for pain managment of left axillary discomfort       Patient will benefit from skilled therapeutic intervention in order to improve the following deficits and impairments:  Decreased range of motion, Impaired UE functional use, Decreased activity tolerance, Decreased knowledge of precautions, Decreased skin integrity, Impaired perceived functional ability, Pain, Decreased knowledge of use of DME, Increased edema, Postural dysfunction, Decreased strength, Impaired sensation, Decreased scar mobility, Decreased mobility, Increased muscle spasms  Visit Diagnosis: Abnormal posture  Lymphedema, not elsewhere classified  Chronic left shoulder pain  Low back pain with sciatica, sciatica laterality unspecified, unspecified back pain laterality, unspecified chronicity  Stiffness of left shoulder joint     Problem List Patient Active Problem List   Diagnosis Date Noted  . Counseling regarding goals of care 04/16/2017  . Bone metastases ([place]) 02/28/2017  . Uncontrolled pain 02/28/2017  . Osteopenia determined by x-ray 09/20/2016  . Primary malignant neoplasm of left breast with stage 2 nodal metastasis per American Joint Committee on Cancer 7th edition (N2) ([place]) 02/14/2016  . Chemotherapy-induced neuropathy ([place]) 12/30/2015  . Insomnia 09/01/2015  . Malignant neoplasm of upper-outer quadrant of left breast in female, estrogen receptor positive ([place]) 08/08/2015  Donato Heinz. Owens Shark PT  Norwood Levo 05/28/2017, 4:28 PM  [place], [place], [address] Phone: [phone]   Fax:  [phone]  Name: Alison [name] MRN: [id number] Date of Birth: 1961-03-05

## 2017-05-28 NOTE — Patient Instructions (Signed)

## 2017-05-30 ENCOUNTER — Ambulatory Visit: Payer: 59 | Admitting: Physical Therapy

## 2017-05-30 DIAGNOSIS — G8929 Other chronic pain: Secondary | ICD-10-CM

## 2017-05-30 DIAGNOSIS — R293 Abnormal posture: Secondary | ICD-10-CM | POA: Diagnosis not present

## 2017-05-30 DIAGNOSIS — I890 Lymphedema, not elsewhere classified: Secondary | ICD-10-CM

## 2017-05-30 DIAGNOSIS — M25612 Stiffness of left shoulder, not elsewhere classified: Secondary | ICD-10-CM

## 2017-05-30 DIAGNOSIS — M5440 Lumbago with sciatica, unspecified side: Secondary | ICD-10-CM

## 2017-05-30 DIAGNOSIS — M25512 Pain in left shoulder: Secondary | ICD-10-CM

## 2017-05-30 NOTE — Therapy (Signed)
[place], [place], [address] Phone: [phone]   Fax:  [phone]  Physical Therapy Treatment  Patient Details  Name: Alison [name] MRN: [id number] Date of Birth: 11/10/1960 Referring Provider: Dr. [name]  Encounter Date: 05/30/2017      PT End of Session - 05/30/17 1652    Visit Number 38   Date for PT Re-Evaluation 06/26/17   PT Start Time 1515   PT Stop Time 1600   PT Time Calculation (min) 45 min   Activity Tolerance Patient tolerated treatment well   Behavior During Therapy [facility] for tasks assessed/performed      Past Medical History:  Diagnosis Date  . Anxiety   . Asthma     seasonal asthma  . Breast cancer ([place])    metastatic breast cancer  . Complication of anesthesia 2007   aspiration pneumonia post hysterectomy.  Blood pressure would drop low- but no problems 01/2016- surgery  . History of blood transfusion   . History of mitral valve prolapse 1988   with palpitations  . History of radiation therapy 04/02/16- 05/21/16   Left Chest Wall, SCV, Axilla, IM nodes  . Hypertension    no meds  . Neuropathy    with chemo  . Osteopenia determined by x-ray   . Pneumonia    1980's and 1990's  . Raynaud's disease   . 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 Skin cancer 03/12/2014   basal- back    Past Surgical History:  Procedure Laterality Date  . ABDOMINAL HYSTERECTOMY  2007   with BSO  . APPENDECTOMY  2007  . AXILLARY LYMPH NODE DISSECTION Left 02/14/2016   Procedure: LEFT AXILLARY LYMPH NODE DISSECTION;  Surgeon: [name], MD;  Location: [place];  Service: General;  Laterality: Left;  . BREAST RECONSTRUCTION WITH PLACEMENT OF TISSUE EXPANDER AND FLEX HD (ACELLULAR HYDRATED DERMIS) Left 02/02/2016   Procedure: IMMEDIATE LEFT BREAST RECONSTRUCTION WITH PLACEMENT OF TISSUE EXPANDER AND FLEX HD (ACELLULAR HYDRATED DERMIS);  Surgeon: [name], DO;  Location: [place];  Service: Plastics;  Laterality: Left;  . BREAST REDUCTION SURGERY Right 03/28/2017   Procedure: RIGHT MAMMARY REDUCTION  (BREAST);  Surgeon: [name], DO;  Location: [place];  Service: Plastics;  Laterality: Right;  . BREAST SURGERY Left 02/02/16   mastectomy with reconstruction  . c-section  1991  . COLON RESECTION SIGMOID  12/02/2012   [facility]   . HERNIA REPAIR  2007  . LAPAROSCOPIC ENDOMETRIOSIS FULGURATION  1984, 1989  . MASTOPEXY Right 03/28/2017   Procedure: MASTOPEXY;  Surgeon: [name], DO;  Location: [place];  Service: Plastics;  Laterality: Right;  . Port- a- cath placement  07/2015  . PORT-A-CATH REMOVAL Right 02/02/2016   Procedure: REMOVAL PORT-A-CATH;  Surgeon: [name], MD;  Location: [place];  Service: General;  Laterality: Right;  . RADIOACTIVE SEED GUIDED AXILLARY SENTINEL LYMPH NODE Left 02/02/2016   Procedure: LEFT BREAST MASTECTOMY AND RADIOACTIVE SEED GUIDED LEFT SENTINEL LYMPH NODE BIOPSY ;  Surgeon: [name], MD;  Location: [facility];  Service: General;  Laterality: Left;  . REMOVAL OF TISSUE EXPANDER AND PLACEMENT OF IMPLANT Left 03/28/2017   Procedure: REMOVAL OF TISSUE EXPANDER AND PLACEMENT OF IMPLANT;  Surgeon: [name], DO;  Location: [place];  Service: Plastics;  Laterality: Left;    There were no vitals filed  for this visit.      Subjective Assessment - 05/30/17 1521    Subjective pt states she still has a little sore throat but overall feels better. She has not used her Circ aid and notices increase fluid in her arm today. She heard from [facility] and they will send the arm sleeve and a representative with call her. She were able to do the exrcise and is ready to advance them.  No increae in back pain    Pertinent History Breast cancer diagnosed November2016 She had chemotherapy. but did  have some neuropathy so it had to be stopped. On June 8, she had a mastectomy with immedicate expander placement and with first set of lymphnodes removed, On June 20 she went for complete axillary lymph node dissection. She says she has had always been "full necked" but has neck and upper body fullness since the cancer was diagnosed. She reports she has osteopenia in low back and has had a fracture that was discovered in 2014 when she had a Dexa scan. Pt has had metastasis to the bones of the spine and recent  bilateral breast surgery for a reduction on the right and expander removal on the left with implant placement and removal    Patient Stated Goals to decrease the pain    Currently in Pain? No/denies                         [facility] Adult PT Treatment/Exercise - 05/30/17 0001      High Level Balance   High Level Balance Activities Side stepping;Braiding;Backward walking;Direction changes;Head turns;Marching forwards   High Level Balance Comments walking on toes and walking on heels      Self-Care   Other Self-Care Comments  called [facility] to check on compression sleeve that has not arrived yet      Neck Exercises: Seated   Other Seated Exercise neck and upper thoracic range of motion      Knee/Hip Exercises: Standing   Heel Raises Both;5 reps   Hip Flexion Stengthening;Right;Left;5 reps   Hip Abduction Stengthening;Right;Left;5 reps   Hip Extension Stengthening;Left;Both;5 reps   Functional Squat 5 reps   Functional Squat Limitations sit to stand then both arms up      Shoulder Exercises: Standing   Row Strengthening;Right;Left;5 reps   Theraband Level (Shoulder Row) Level 2 (Red)   Other Standing Exercises unilateral triceps extenion with red theraband on doorknob x 5 reps with each arm                 PT Education - 05/30/17 1651    Education provided Yes   Education Details standing hip exercise and gait home program    Person(s) Educated  Patient   Methods Explanation;Demonstration;Handout   Comprehension Returned demonstration;Verbalized understanding           Short Term Clinic Goals - 05/16/17 1757      CC Short Term Goal  #1   Title Pt will report she knows strategies for good body mechanics to decrease low back pain    Time 4   Period Weeks   Status On-going     CC Short Term Goal  #2   Title Pt will have a deacrease in circumference 15 cm prox to ulnar styloid of left forearm by 1 cm    Baseline 25 cm , on 05/16/2017 it is 23.5   Status Achieved     CC Short Term Goal  #  3   Title Pt will report she knows how to manage left UE lymphedema with use of compression, elevation and exercise, self manual lymph drainage and possibly Flexitouch    Baseline 05/16/2017, has nighttime garment, daytime is ordered and is waiting on [place] - 05/16/17 1759      CC Long Term Goal  #1   Title Patient will report  that she can manage back pain  so she can perform daily activities with greater ease   Time 8   Period Weeks   Status On-going     CC Long Term Goal  #2   Title Pt will  have given the TENS unit a trial to see if it will help with back pain    Baseline has not needed to use TENS on 05/16/2017   Time 8   Period Weeks   Status On-going     CC Long Term Goal  #3   Title Pt will be independent in core and  strengthening exercises    Time 8   Period Weeks   Status On-going     CC Long Term Goal  #4   Title Pt will report low back pain is decreased to a 4/10 so that she can perform activities of daily living easier    Time 8   Period Weeks   Status On-going            Plan - 05/30/17 1652    Clinical Impression Statement Upgraded home exercise to standing core and UE ex with theraband and standing hip exercises and dynamic gait and balance.  Alison [name] had the most difficulty with braiding but was able to do all other exercises well for 5  repetitions with some signs of muscle fatigue.  She did not require a rest period. She is demonstrating overall improvment though lymphedema in left arm is still visible and palpable    PT Treatment/Interventions ADLs/Self Care Home Management;Manual lymph drainage;Compression bandaging;Scar mobilization;Passive range of motion;Patient/family education;Taping;Manual techniques;Therapeutic exercise;Therapeutic activities;Orthotic Fit/Training;DME Instruction;Electrical Stimulation;Moist Heat   PT Next Visit Plan  try treadmill. continue with dynamic gait and LE strengthening exercise. add lunges  Monitor lymphedema, continue standing isometrics and progress core  general exercise for strengthening and pain managment with TENS as needed. MLD as needed for pain managment of left axillary discomfort    Consulted and Agree with Plan of Care Patient      Patient will benefit from skilled therapeutic intervention in order to improve the following deficits and impairments:  Decreased range of motion, Impaired UE functional use, Decreased activity tolerance, Decreased knowledge of precautions, Decreased skin integrity, Impaired perceived functional ability, Pain, Decreased knowledge of use of DME, Increased edema, Postural dysfunction, Decreased strength, Impaired sensation, Decreased scar mobility, Decreased mobility, Increased muscle spasms  Visit Diagnosis: Abnormal posture  Chronic left shoulder pain  Lymphedema, not elsewhere classified  Low back pain with sciatica, sciatica laterality unspecified, unspecified back pain laterality, unspecified chronicity  Stiffness of left shoulder joint     Problem List Patient Active Problem List   Diagnosis Date Noted  . Counseling regarding goals of care 04/16/2017  . Bone metastases ([place]) 02/28/2017  . Uncontrolled pain 02/28/2017  . Osteopenia determined by x-ray 09/20/2016  . Primary malignant neoplasm of left breast with stage 2 nodal metastasis per  American Joint Committee on Cancer 7th edition (N2) ([place]) 02/14/2016  .  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 Insomnia 09/01/2015  . Malignant neoplasm of upper-outer quadrant of left breast in female, estrogen receptor positive ([place]) 08/08/2015   Alison [name] PT  Alison [name] 05/30/2017, 4:57 PM  [place], [place], [address] Phone: [phone]   Fax:  [phone]  Name: Alison [name] MRN: [id number] Date of Birth: 02/07/1961

## 2017-06-04 ENCOUNTER — Ambulatory Visit: Payer: 59 | Admitting: Physical Therapy

## 2017-06-04 DIAGNOSIS — M25612 Stiffness of left shoulder, not elsewhere classified: Secondary | ICD-10-CM

## 2017-06-04 DIAGNOSIS — R293 Abnormal posture: Secondary | ICD-10-CM

## 2017-06-04 DIAGNOSIS — M5440 Lumbago with sciatica, unspecified side: Secondary | ICD-10-CM

## 2017-06-04 DIAGNOSIS — G8929 Other chronic pain: Secondary | ICD-10-CM

## 2017-06-04 DIAGNOSIS — I890 Lymphedema, not elsewhere classified: Secondary | ICD-10-CM

## 2017-06-04 DIAGNOSIS — M25512 Pain in left shoulder: Secondary | ICD-10-CM

## 2017-06-04 NOTE — Therapy (Signed)
[place], [place], [address] Phone: [phone]   Fax:  [phone]  Physical Therapy Treatment  Patient Details  Name: Alison [name] MRN: [id number] Date of Birth: October 17, 1960 Referring Provider: Dr. [name]  Encounter Date: 06/04/2017      PT End of Session - 06/04/17 1735    Visit Number 39   Date for PT Re-Evaluation 06/26/17   PT Start Time 1430   PT Stop Time 1515   PT Time Calculation (min) 45 min   Activity Tolerance Patient tolerated treatment well   Behavior During Therapy [facility] for tasks assessed/performed      Past Medical History:  Diagnosis Date  . Anxiety   . Asthma     seasonal asthma  . Breast cancer ([place])    metastatic breast cancer  . Complication of anesthesia 2007   aspiration pneumonia post hysterectomy.  Blood pressure would drop low- but no problems 01/2016- surgery  . History of blood transfusion   . History of mitral valve prolapse 1988   with palpitations  . History of radiation therapy 04/02/16- 05/21/16   Left Chest Wall, SCV, Axilla, IM nodes  . Hypertension    no meds  . Neuropathy    with chemo  . Osteopenia determined by x-ray   . Pneumonia    1980's and 1990's  . Raynaud's disease   . 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 Skin cancer 03/12/2014   basal- back    Past Surgical History:  Procedure Laterality Date  . ABDOMINAL HYSTERECTOMY  2007   with BSO  . APPENDECTOMY  2007  . AXILLARY LYMPH NODE DISSECTION Left 02/14/2016   Procedure: LEFT AXILLARY LYMPH NODE DISSECTION;  Surgeon: [name], MD;  Location: [place];  Service: General;  Laterality: Left;  . BREAST RECONSTRUCTION WITH PLACEMENT OF TISSUE EXPANDER AND FLEX HD (ACELLULAR HYDRATED DERMIS) Left 02/02/2016   Procedure: IMMEDIATE LEFT BREAST RECONSTRUCTION WITH PLACEMENT OF TISSUE EXPANDER AND FLEX HD (ACELLULAR HYDRATED DERMIS);  Surgeon: [name], DO;  Location: [place];  Service: Plastics;  Laterality: Left;  . BREAST REDUCTION SURGERY Right 03/28/2017   Procedure: RIGHT MAMMARY REDUCTION  (BREAST);  Surgeon: [name], DO;  Location: [place];  Service: Plastics;  Laterality: Right;  . BREAST SURGERY Left 02/02/16   mastectomy with reconstruction  . c-section  1991  . COLON RESECTION SIGMOID  12/02/2012   [facility]   . HERNIA REPAIR  2007  . LAPAROSCOPIC ENDOMETRIOSIS FULGURATION  1984, 1989  . MASTOPEXY Right 03/28/2017   Procedure: MASTOPEXY;  Surgeon: [name], DO;  Location: [place];  Service: Plastics;  Laterality: Right;  . Port- a- cath placement  07/2015  . PORT-A-CATH REMOVAL Right 02/02/2016   Procedure: REMOVAL PORT-A-CATH;  Surgeon: [name], MD;  Location: [place];  Service: General;  Laterality: Right;  . RADIOACTIVE SEED GUIDED AXILLARY SENTINEL LYMPH NODE Left 02/02/2016   Procedure: LEFT BREAST MASTECTOMY AND RADIOACTIVE SEED GUIDED LEFT SENTINEL LYMPH NODE BIOPSY ;  Surgeon: [name], MD;  Location: [place];  Service: General;  Laterality: Left;  . REMOVAL OF TISSUE EXPANDER AND PLACEMENT OF IMPLANT Left 03/28/2017   Procedure: REMOVAL OF TISSUE EXPANDER AND PLACEMENT OF IMPLANT;  Surgeon: [name], DO;  Location: [place];  Service: Plastics;  Laterality: Left;    There were no vitals filed  for this visit.      Subjective Assessment - 06/04/17 1729    Subjective Pt is doing well today,  she went to see Dr Marla Roe today . She is still having redness with some drainage from left mastectomy site that Dr. Marla Roe thinks is from radiation skin.  She is ready to progress exercises today    Pertinent History Breast cancer diagnosed November2016 She had chemotherapy. but did have some neuropathy so it had to be stopped. On June 8, she had a mastectomy with immedicate  expander placement and with first set of lymphnodes removed, On June 20 she went for complete axillary lymph node dissection. She says she has had always been "full necked" but has neck and upper body fullness since the cancer was diagnosed. She reports she has osteopenia in low back and has had a fracture that was discovered in 2014 when she had a Dexa scan. Pt has had metastasis to the bones of the spine and recent  bilateral breast surgery for a reduction on the right and expander removal on the left with implant placement and removal    Patient Stated Goals to decrease the pain    Currently in Pain? No/denies                         [facility] Adult PT Treatment/Exercise - 06/04/17 0001      Balance Poses: Yoga   Warrior I 2 reps;Limitations   Warrior I Limitations both hands overhead    Tree Pose 2 reps;15 seconds;Limitations   Tree Pose Limitations other foot in "kickstand"      Self-Care   Other Self-Care Comments  pt states she got her sleeve.      Lumbar Exercises: Aerobic   Tread Mill 0% grad 1.5 mph for 5 minutes      Lumbar Exercises: Standing   Heel Raises 5 reps   Functional Squats 20 reps   Functional Squats Limitations mini knee bends with arms overhead and wall taps    Row AROM;Right;Left   Theraband Level (Row) Level 2 (Red)     Knee/Hip Exercises: Standing   Hip Abduction Stengthening;Right;Left;10 reps   Forward Step Up Right;Left;10 reps   Forward Step Up Limitations holding 2 dowel rods for balance    Step Down Right;Left;10 reps   Rocker Board 1 minute   Rocker Board Limitations on BOSU 30 sec x 2 with dowel rods for balance    SLS 5 reps on each leg                    Short Term Clinic Goals - 05/16/17 1757      CC Short Term Goal  #1   Title Pt will report she knows strategies for good body mechanics to decrease low back pain    Time 4   Period Weeks   Status On-going     CC Short Term Goal  #2   Title Pt will have a  deacrease in circumference 15 cm prox to ulnar styloid of left forearm by 1 cm    Baseline 25 cm , on 05/16/2017 it is 23.5   Status Achieved     CC Short Term Goal  #3   Title Pt will report she knows how to manage left UE lymphedema with use of compression, elevation and exercise, self manual lymph drainage and possibly Flexitouch    Baseline 05/16/2017, has nighttime garment, daytime is ordered and is waiting  on [place] [facility] Goals - 05/16/17 1759      CC Long Term Goal  #1   Title Patient will report  that she can manage back pain  so she can perform daily activities with greater ease   Time 8   Period Weeks   Status On-going     CC Long Term Goal  #2   Title Pt will  have given the TENS unit a trial to see if it will help with back pain    Baseline has not needed to use TENS on 05/16/2017   Time 8   Period Weeks   Status On-going     CC Long Term Goal  #3   Title Pt will be independent in core and  strengthening exercises    Time 8   Period Weeks   Status On-going     CC Long Term Goal  #4   Title Pt will report low back pain is decreased to a 4/10 so that she can perform activities of daily living easier    Time 8   Period Weeks   Status On-going            Plan - 06/04/17 1735    Clinical Impression Statement continued upgrade of exercise program and pt did extremely well with  and welcomed all exercises.  She shows improvment from last session in strength, balance and endurance. Pt reports she is doing home program  at home also.   Rehab Potential Fair   Clinical Impairments Affecting Rehab Potential chemotherapy with neuropathy , radiation to back    PT Frequency 2x / week   PT Duration 8 weeks   PT Next Visit Plan  continue treadmill. continue with dynamic gait and LE strengthening exercise. continue with BOSU or airex. add walking lunges  Monitor lymphedema, continue standing isometrics and progress core  general  exercise for strengthening and pain managment with TENS as needed. MLD as needed for pain managment of left axillary discomfort    Consulted and Agree with Plan of Care Patient      Patient will benefit from skilled therapeutic intervention in order to improve the following deficits and impairments:     Visit Diagnosis: Abnormal posture  Chronic left shoulder pain  Lymphedema, not elsewhere classified  Low back pain with sciatica, sciatica laterality unspecified, unspecified back pain laterality, unspecified chronicity  Stiffness of left shoulder joint     Problem List Patient Active Problem List   Diagnosis Date Noted  . Counseling regarding goals of care 04/16/2017  . Bone metastases ([place]) 02/28/2017  . Uncontrolled pain 02/28/2017  . Osteopenia determined by x-ray 09/20/2016  . Primary malignant neoplasm of left breast with stage 2 nodal metastasis per American Joint Committee on Cancer 7th edition (N2) ([place]) 02/14/2016  . Chemotherapy-induced neuropathy ([place]) 12/30/2015  . Insomnia 09/01/2015  . Malignant neoplasm of upper-outer quadrant of left breast in female, estrogen receptor positive ([place]) 08/08/2015   Donato Heinz. Owens Shark PT  Norwood Levo 06/04/2017, 5:39 PM  [place], [place], [address] Phone: [phone]   Fax:  [phone]  Name: Alison [name] MRN: [id number] Date of Birth: 04-Apr-1961

## 2017-06-06 ENCOUNTER — Ambulatory Visit: Payer: 59 | Admitting: Physical Therapy

## 2017-06-06 DIAGNOSIS — M25612 Stiffness of left shoulder, not elsewhere classified: Secondary | ICD-10-CM

## 2017-06-06 DIAGNOSIS — M5440 Lumbago with sciatica, unspecified side: Secondary | ICD-10-CM

## 2017-06-06 DIAGNOSIS — I890 Lymphedema, not elsewhere classified: Secondary | ICD-10-CM

## 2017-06-06 DIAGNOSIS — R293 Abnormal posture: Secondary | ICD-10-CM | POA: Diagnosis not present

## 2017-06-06 DIAGNOSIS — M25512 Pain in left shoulder: Secondary | ICD-10-CM

## 2017-06-06 DIAGNOSIS — M79601 Pain in right arm: Secondary | ICD-10-CM

## 2017-06-06 DIAGNOSIS — G8929 Other chronic pain: Secondary | ICD-10-CM

## 2017-06-06 NOTE — Therapy (Signed)
[place], [place], [address] Phone: [phone]   Fax:  [phone]  Physical Therapy Treatment  Patient Details  Name: Alison [name] MRN: [id number] Date of Birth: 1960/11/16 Referring Provider: Dr. [name]  Encounter Date: 06/06/2017      PT End of Session - 06/06/17 1623    Visit Number 40   Number of Visits 55   Date for PT Re-Evaluation 06/26/17   PT Start Time 1430   PT Stop Time 1515   PT Time Calculation (min) 45 min   Activity Tolerance Patient tolerated treatment well   Behavior During Therapy [facility] for tasks assessed/performed      Past Medical History:  Diagnosis Date  . Anxiety   . Asthma     seasonal asthma  . Breast cancer ([place])    metastatic breast cancer  . Complication of anesthesia 2007   aspiration pneumonia post hysterectomy.  Blood pressure would drop low- but no problems 01/2016- surgery  . History of blood transfusion   . History of mitral valve prolapse 1988   with palpitations  . History of radiation therapy 04/02/16- 05/21/16   Left Chest Wall, SCV, Axilla, IM nodes  . Hypertension    no meds  . Neuropathy    with chemo  . Osteopenia determined by x-ray   . Pneumonia    1980's and 1990's  . Raynaud's disease   . 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 Skin cancer 03/12/2014   basal- back    Past Surgical History:  Procedure Laterality Date  . ABDOMINAL HYSTERECTOMY  2007   with BSO  . APPENDECTOMY  2007  . AXILLARY LYMPH NODE DISSECTION Left 02/14/2016   Procedure: LEFT AXILLARY LYMPH NODE DISSECTION;  Surgeon: [name], MD;  Location: [place];  Service: General;  Laterality: Left;  . BREAST RECONSTRUCTION WITH PLACEMENT OF TISSUE EXPANDER AND FLEX HD (ACELLULAR HYDRATED DERMIS) Left 02/02/2016   Procedure: IMMEDIATE LEFT BREAST RECONSTRUCTION WITH PLACEMENT OF TISSUE EXPANDER AND FLEX HD (ACELLULAR HYDRATED DERMIS);   Surgeon: [name], DO;  Location: [place];  Service: Plastics;  Laterality: Left;  . BREAST REDUCTION SURGERY Right 03/28/2017   Procedure: RIGHT MAMMARY REDUCTION  (BREAST);  Surgeon: [name], DO;  Location: [place];  Service: Plastics;  Laterality: Right;  . BREAST SURGERY Left 02/02/16   mastectomy with reconstruction  . c-section  1991  . COLON RESECTION SIGMOID  12/02/2012   [facility]   . HERNIA REPAIR  2007  . LAPAROSCOPIC ENDOMETRIOSIS FULGURATION  1984, 1989  . MASTOPEXY Right 03/28/2017   Procedure: MASTOPEXY;  Surgeon: [name], DO;  Location: [place];  Service: Plastics;  Laterality: Right;  . Port- a- cath placement  07/2015  . PORT-A-CATH REMOVAL Right 02/02/2016   Procedure: REMOVAL PORT-A-CATH;  Surgeon: [name], MD;  Location: [place];  Service: General;  Laterality: Right;  . RADIOACTIVE SEED GUIDED AXILLARY SENTINEL LYMPH NODE Left 02/02/2016   Procedure: LEFT BREAST MASTECTOMY AND RADIOACTIVE SEED GUIDED LEFT SENTINEL LYMPH NODE BIOPSY ;  Surgeon: [name], MD;  Location: [place];  Service: General;  Laterality: Left;  . REMOVAL OF TISSUE EXPANDER AND PLACEMENT OF IMPLANT Left 03/28/2017   Procedure: REMOVAL OF TISSUE EXPANDER AND PLACEMENT OF IMPLANT;  Surgeon: [name], DO;  Location: [place];  Service: Plastics;  Laterality: Left;  There were no vitals filed for this visit.      Subjective Assessment - 06/06/17 1437    Subjective Pt had no soreness after last visit. Pt got trained for the Flexitouch today for her arm piece. She said it felt good and she does not have any questions but kmow who to call if she does    Pertinent History Breast cancer diagnosed November2016 She had chemotherapy. but did have some neuropathy so it had to be stopped. On June 8, she had a mastectomy with immedicate expander placement and with  first set of lymphnodes removed, On June 20 she went for complete axillary lymph node dissection. She says she has had always been "full necked" but has neck and upper body fullness since the cancer was diagnosed. She reports she has osteopenia in low back and has had a fracture that was discovered in 2014 when she had a Dexa scan. Pt has had metastasis to the bones of the spine and recent  bilateral breast surgery for a reduction on the right and expander removal on the left with implant placement and removal    Patient Stated Goals to decrease the pain    Currently in Pain? No/denies                         [facility] Adult PT Treatment/Exercise - 06/06/17 0001      High Level Balance   High Level Balance Comments standin on airex for 30 sec x 3 with 2 dowel rods with eyes closed, on BOSU with 2 dowels with eyes open for 30 sec x 2      Neck Exercises: Seated   Other Seated Exercise neck and upper thoracic range of motion      Lumbar Exercises: Aerobic   Tread Mill 0% grad 1.5 mph for 8 minutes      Knee/Hip Exercises: Standing   Forward Step Up Right;Left;10 reps   Forward Step Up Limitations holding 2 dowel rods for balance    Step Down Right;Left;10 reps     Knee/Hip Exercises: Seated   Marching Right;Left;10 reps  sitting on red air circle wiht empahsis on core stability      Shoulder Exercises: Standing   Other Standing Exercises UE ranger, step forward into flexion x 10 reps and also to the side x 10 reps.                    Short Term Clinic Goals - 05/16/17 1757      CC Short Term Goal  #1   Title Pt will report she knows strategies for good body mechanics to decrease low back pain    Time 4   Period Weeks   Status On-going     CC Short Term Goal  #2   Title Pt will have a deacrease in circumference 15 cm prox to ulnar styloid of left forearm by 1 cm    Baseline 25 cm , on 05/16/2017 it is 23.5   Status Achieved     CC Short Term Goal  #3    Title Pt will report she knows how to manage left UE lymphedema with use of compression, elevation and exercise, self manual lymph drainage and possibly Flexitouch    Baseline 05/16/2017, has nighttime garment, daytime is ordered and is waiting on [place] -  05/16/17 1759      CC Long Term Goal  #1   Title Patient will report  that she can manage back pain  so she can perform daily activities with greater ease   Time 8   Period Weeks   Status On-going     CC Long Term Goal  #2   Title Pt will  have given the TENS unit a trial to see if it will help with back pain    Baseline has not needed to use TENS on 05/16/2017   Time 8   Period Weeks   Status On-going     CC Long Term Goal  #3   Title Pt will be independent in core and  strengthening exercises    Time 8   Period Weeks   Status On-going     CC Long Term Goal  #4   Title Pt will report low back pain is decreased to a 4/10 so that she can perform activities of daily living easier    Time 8   Period Weeks   Status On-going            Plan - 06/06/17 1623    Clinical Impression Statement Pt continues to improve in exercise tolerance .  She benefits for work on balance with less stable service    Clinical Impairments Affecting Rehab Potential chemotherapy with neuropathy , radiation to back    PT Next Visit Plan  continue treadmill. continue with dynamic gait and LE strengthening exercise. continue with BOSU or airex. add walking lunges  Monitor lymphedema, continue standing isometrics and progress core  general exercise for strengthening and pain managment with TENS as needed. MLD as needed for pain managment of left axillary discomfort       Patient will benefit from skilled therapeutic intervention in order to improve the following deficits and impairments:  Decreased range of motion, Impaired UE functional use, Decreased activity tolerance, Decreased knowledge of  precautions, Decreased skin integrity, Impaired perceived functional ability, Pain, Decreased knowledge of use of DME, Increased edema, Postural dysfunction, Decreased strength, Impaired sensation, Decreased scar mobility, Decreased mobility, Increased muscle spasms  Visit Diagnosis: Abnormal posture  Chronic left shoulder pain  Lymphedema, not elsewhere classified  Low back pain with sciatica, sciatica laterality unspecified, unspecified back pain laterality, unspecified chronicity  Stiffness of left shoulder joint  Left shoulder pain, unspecified chronicity  Pain in right arm     Problem List Patient Active Problem List   Diagnosis Date Noted  . Counseling regarding goals of care 04/16/2017  . Bone metastases ([place]) 02/28/2017  . Uncontrolled pain 02/28/2017  . Osteopenia determined by x-ray 09/20/2016  . Primary malignant neoplasm of left breast with stage 2 nodal metastasis per American Joint Committee on Cancer 7th edition (N2) ([place]) 02/14/2016  . Chemotherapy-induced neuropathy ([place]) 12/30/2015  . Insomnia 09/01/2015  . Malignant neoplasm of upper-outer quadrant of left breast in female, estrogen receptor positive ([place]) 08/08/2015   Donato Heinz. Owens Shark PT  [place] Levo 06/06/2017, 4:25 PM  [place], [place], [address] Phone: [phone]   Fax:  [phone]  Name: MARGAN ELIAS MRN: [id number] Date of Birth: 04-25-1961

## 2017-06-11 ENCOUNTER — Ambulatory Visit: Payer: 59 | Admitting: Physical Therapy

## 2017-06-11 DIAGNOSIS — M25512 Pain in left shoulder: Secondary | ICD-10-CM

## 2017-06-11 DIAGNOSIS — M5440 Lumbago with sciatica, unspecified side: Secondary | ICD-10-CM

## 2017-06-11 DIAGNOSIS — R293 Abnormal posture: Secondary | ICD-10-CM

## 2017-06-11 DIAGNOSIS — G8929 Other chronic pain: Secondary | ICD-10-CM

## 2017-06-11 DIAGNOSIS — I890 Lymphedema, not elsewhere classified: Secondary | ICD-10-CM

## 2017-06-11 DIAGNOSIS — M25612 Stiffness of left shoulder, not elsewhere classified: Secondary | ICD-10-CM

## 2017-06-11 NOTE — Therapy (Signed)
[place], [place], [address] Phone: [phone]   Fax:  [phone]  Physical Therapy Treatment  Patient Details  Name: Alison [name] MRN: [id number] Date of Birth: 04-09-1961 Referring Provider: Dr. [name]  Encounter Date: 06/11/2017      PT End of Session - 06/11/17 1741    Visit Number 41   Number of Visits 55   Date for PT Re-Evaluation 06/26/17   PT Start Time 1540   PT Stop Time 1430   PT Time Calculation (min) 45 min   Activity Tolerance Patient tolerated treatment well   Behavior During Therapy [facility] for tasks assessed/performed      Past Medical History:  Diagnosis Date  . Anxiety   . Asthma     seasonal asthma  . Breast cancer ([place])    metastatic breast cancer  . Complication of anesthesia 2007   aspiration pneumonia post hysterectomy.  Blood pressure would drop low- but no problems 01/2016- surgery  . History of blood transfusion   . History of mitral valve prolapse 1988   with palpitations  . History of radiation therapy 04/02/16- 05/21/16   Left Chest Wall, SCV, Axilla, IM nodes  . Hypertension    no meds  . Neuropathy    with chemo  . Osteopenia determined by x-ray   . Pneumonia    1980's and 1990's  . Raynaud's disease   . 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 Skin cancer 03/12/2014   basal- back    Past Surgical History:  Procedure Laterality Date  . ABDOMINAL HYSTERECTOMY  2007   with BSO  . APPENDECTOMY  2007  . AXILLARY LYMPH NODE DISSECTION Left 02/14/2016   Procedure: LEFT AXILLARY LYMPH NODE DISSECTION;  Surgeon: [name], MD;  Location: [place];  Service: General;  Laterality: Left;  . BREAST RECONSTRUCTION WITH PLACEMENT OF TISSUE EXPANDER AND FLEX HD (ACELLULAR HYDRATED DERMIS) Left 02/02/2016   Procedure: IMMEDIATE LEFT BREAST RECONSTRUCTION WITH PLACEMENT OF TISSUE EXPANDER AND FLEX HD (ACELLULAR HYDRATED DERMIS);   Surgeon: [name], DO;  Location: [place];  Service: Plastics;  Laterality: Left;  . BREAST REDUCTION SURGERY Right 03/28/2017   Procedure: RIGHT MAMMARY REDUCTION  (BREAST);  Surgeon: [name], DO;  Location: [place];  Service: Plastics;  Laterality: Right;  . BREAST SURGERY Left 02/02/16   mastectomy with reconstruction  . c-section  1991  . COLON RESECTION SIGMOID  12/02/2012   [facility]   . HERNIA REPAIR  2007  . LAPAROSCOPIC ENDOMETRIOSIS FULGURATION  1984, 1989  . MASTOPEXY Right 03/28/2017   Procedure: MASTOPEXY;  Surgeon: [name], DO;  Location: [place];  Service: Plastics;  Laterality: Right;  . Port- a- cath placement  07/2015  . PORT-A-CATH REMOVAL Right 02/02/2016   Procedure: REMOVAL PORT-A-CATH;  Surgeon: [name], MD;  Location: [place];  Service: General;  Laterality: Right;  . RADIOACTIVE SEED GUIDED AXILLARY SENTINEL LYMPH NODE Left 02/02/2016   Procedure: LEFT BREAST MASTECTOMY AND RADIOACTIVE SEED GUIDED LEFT SENTINEL LYMPH NODE BIOPSY ;  Surgeon: [name], MD;  Location: [place];  Service: General;  Laterality: Left;  . REMOVAL OF TISSUE EXPANDER AND PLACEMENT OF IMPLANT Left 03/28/2017   Procedure: REMOVAL OF TISSUE EXPANDER AND PLACEMENT OF IMPLANT;  Surgeon: [name], DO;  Location: [place];  Service: Plastics;  Laterality: Left;  There were no vitals filed for this visit.      Subjective Assessment - 06/11/17 1352    Subjective Pt has been doing well.  A little soreness in her back after doing lots of laundry since the power outage. She feels that she has more energy overall.    Patient Stated Goals to decrease the pain    Currently in Pain? Yes   Pain Score 4    Pain Location Back   Pain Orientation Proximal;Lower;Mid   Pain Descriptors / Indicators Aching   Pain Type Chronic pain                          [facility] Adult PT Treatment/Exercise - 06/11/17 0001      High Level Balance   High Level Balance Comments standin on airex for 30 sec x 3 with 2 dowel rods with eyes closed, on BOSU with 2 dowels with eyes open for 30 sec x 2      Neck Exercises: Seated   Other Seated Exercise neck and upper thoracic range of motion      Lumbar Exercises: Aerobic   Tread Mill 0% grad 1.5 mph for 10 nutes      Lumbar Exercises: Standing   Other Standing Lumbar Exercises walking lunges and monster walks 2x 15 feet each      Knee/Hip Exercises: Standing   Rocker Board Limitations on BOSU 30 sec x 2 with dowel rods for balance      Knee/Hip Exercises: Seated   Marching Right;Left;10 reps  sitting on red air circle wiht empahsis on core stability      Shoulder Exercises: Standing   Row Strengthening;Right;Left;5 reps   Theraband Level (Shoulder Row) Level 2 (Red)   Row Limitations 10 with both arms , 10 with each arm    Other Standing Exercises UE ranger, step forward into flexion x 10 reps and also to the side x 10 reps.    Other Standing Exercises on airex pad, 2# for elbow flexion x 10 reps and small intital range of shoulder flexion and abuction      Shoulder Exercises: Pulleys   Flexion 2 minutes   ABduction 2 minutes     Shoulder Exercises: ROM/Strengthening   Wall Pushups --  5 reps   Other ROM/Strengthening Exercises ball toss to wall for catch and with force    Other ROM/Strengthening Exercises wall stretches and finger ladder                    Short Term Clinic Goals - 05/16/17 1757      CC Short Term Goal  #1   Title Pt will report she knows strategies for good body mechanics to decrease low back pain    Time 4   Period Weeks   Status On-going     CC Short Term Goal  #2   Title Pt will have a deacrease in circumference 15 cm prox to ulnar styloid of left forearm by 1 cm    Baseline 25 cm , on 05/16/2017 it is 23.5   Status  Achieved     CC Short Term Goal  #3   Title Pt will report she knows how to manage left UE lymphedema with use of compression, elevation and exercise, self manual lymph drainage and possibly Flexitouch    Baseline 05/16/2017, has nighttime garment, daytime is ordered and is waiting on Flexi    Status On-going  [facility] Goals - 05/16/17 1759      CC Long Term Goal  #1   Title Patient will report  that she can manage back pain  so she can perform daily activities with greater ease   Time 8   Period Weeks   Status On-going     CC Long Term Goal  #2   Title Pt will  have given the TENS unit a trial to see if it will help with back pain    Baseline has not needed to use TENS on 05/16/2017   Time 8   Period Weeks   Status On-going     CC Long Term Goal  #3   Title Pt will be independent in core and  strengthening exercises    Time 8   Period Weeks   Status On-going     CC Long Term Goal  #4   Title Pt will report low back pain is decreased to a 4/10 so that she can perform activities of daily living easier    Time 8   Period Weeks   Status On-going            Plan - 06/11/17 1741    Clinical Impression Statement Pt continues to imiprove, especially in balance.  She has mild lymphedema in left arm,but it is managable Overall improvming    Clinical Impairments Affecting Rehab Potential chemotherapy with neuropathy , radiation to back    PT Frequency 2x / week   PT Duration 8 weeks   PT Next Visit Plan  continue treadmill. continue with dynamic gait and LE strengthening exercise. continue with BOSU or airex. add walking lunges  Monitor lymphedema, continue standing isometrics and progress core  general exercise for strengthening and pain managment with TENS as needed. MLD as needed for pain managment of left axillary discomfort       Patient will benefit from skilled therapeutic intervention in order to improve the following deficits and impairments:   Decreased range of motion, Impaired UE functional use, Decreased activity tolerance, Decreased knowledge of precautions, Decreased skin integrity, Impaired perceived functional ability, Pain, Decreased knowledge of use of DME, Increased edema, Postural dysfunction, Decreased strength, Impaired sensation, Decreased scar mobility, Decreased mobility, Increased muscle spasms  Visit Diagnosis: Abnormal posture  Chronic left shoulder pain  Lymphedema, not elsewhere classified  Low back pain with sciatica, sciatica laterality unspecified, unspecified back pain laterality, unspecified chronicity  Stiffness of left shoulder joint  Left shoulder pain, unspecified chronicity     Problem List Patient Active Problem List   Diagnosis Date Noted  . Counseling regarding goals of care 04/16/2017  . Bone metastases ([place]) 02/28/2017  . Uncontrolled pain 02/28/2017  . Osteopenia determined by x-ray 09/20/2016  . Primary malignant neoplasm of left breast with stage 2 nodal metastasis per American Joint Committee on Cancer 7th edition (N2) ([place]) 02/14/2016  . Chemotherapy-induced neuropathy ([place]) 12/30/2015  . Insomnia 09/01/2015  . Malignant neoplasm of upper-outer quadrant of left breast in female, estrogen receptor positive ([place]) 08/08/2015   Donato Heinz. Owens Shark PT  Norwood Levo 06/11/2017, 5:45 PM  [place], [place], [address] Phone: [phone]   Fax:  [phone]  Name: Alison [name] MRN: [id number] Date of Birth: 1961-08-25

## 2017-06-12 ENCOUNTER — Other Ambulatory Visit: Payer: Self-pay

## 2017-06-12 MED ORDER — OXYCODONE HCL 15 MG PO TABS: 15.0000 mg | ORAL_TABLET | ORAL | 0 refills | Status: DC | PRN

## 2017-06-13 ENCOUNTER — Encounter: Payer: 59 | Admitting: Physical Therapy

## 2017-06-14 ENCOUNTER — Ambulatory Visit (HOSPITAL_COMMUNITY)
Admission: RE | Admit: 2017-06-14 | Discharge: 2017-06-14 | Disposition: A | Discharge status: Home / Self Care | Payer: 59 | Source: Ambulatory Visit | Attending: Oncology | Admitting: Oncology

## 2017-06-14 ENCOUNTER — Encounter (HOSPITAL_COMMUNITY)
Admission: RE | Admit: 2017-06-14 | Discharge: 2017-06-14 | Disposition: A | Discharge status: Home / Self Care | Payer: 59 | Source: Ambulatory Visit | Attending: Oncology | Admitting: Oncology

## 2017-06-14 DIAGNOSIS — C50412 Malignant neoplasm of upper-outer quadrant of left female breast: Secondary | ICD-10-CM | POA: Insufficient documentation

## 2017-06-14 DIAGNOSIS — Z9012 Acquired absence of left breast and nipple: Secondary | ICD-10-CM | POA: Diagnosis not present

## 2017-06-14 DIAGNOSIS — M8580 Other specified disorders of bone density and structure, unspecified site: Secondary | ICD-10-CM | POA: Diagnosis not present

## 2017-06-14 DIAGNOSIS — Z170 Estrogen receptor positive status [ER+]: Secondary | ICD-10-CM | POA: Insufficient documentation

## 2017-06-14 DIAGNOSIS — I7789 Other specified disorders of arteries and arterioles: Secondary | ICD-10-CM | POA: Insufficient documentation

## 2017-06-14 DIAGNOSIS — M4854XA Collapsed vertebra, not elsewhere classified, thoracic region, initial encounter for fracture: Secondary | ICD-10-CM | POA: Insufficient documentation

## 2017-06-14 DIAGNOSIS — C779 Secondary and unspecified malignant neoplasm of lymph node, unspecified: Secondary | ICD-10-CM | POA: Insufficient documentation

## 2017-06-14 DIAGNOSIS — J90 Pleural effusion, not elsewhere classified: Secondary | ICD-10-CM | POA: Insufficient documentation

## 2017-06-14 DIAGNOSIS — I739 Peripheral vascular disease, unspecified: Secondary | ICD-10-CM | POA: Diagnosis not present

## 2017-06-14 DIAGNOSIS — Z7189 Other specified counseling: Secondary | ICD-10-CM

## 2017-06-14 DIAGNOSIS — M4856XA Collapsed vertebra, not elsewhere classified, lumbar region, initial encounter for fracture: Secondary | ICD-10-CM | POA: Insufficient documentation

## 2017-06-14 DIAGNOSIS — C7951 Secondary malignant neoplasm of bone: Secondary | ICD-10-CM | POA: Insufficient documentation

## 2017-06-14 DIAGNOSIS — C50912 Malignant neoplasm of unspecified site of left female breast: Secondary | ICD-10-CM

## 2017-06-14 MED ORDER — TECHNETIUM TC 99M MEDRONATE IV KIT
20.4000 | PACK | Freq: Once | INTRAVENOUS | Status: AC | PRN
  Administered 2017-06-14: 20.4 via INTRAVENOUS

## 2017-06-14 NOTE — Telephone Encounter (Signed)
No entry 

## 2017-06-17 NOTE — Progress Notes (Signed)
[place]  [phone] [phone]   ID: PEYTAN ANDRINGA DOB: [birth year]  MR#: [id number]  JKK#:[id number]  Patient Care Team: [name], MD as PCP - General (Internal Medicine) Erroll Luna, MD as Consulting Physician (General Surgery) , Virgie Dad, MD as Consulting Physician (Oncology) Aloha Gell, MD as Consulting Physician (Obstetrics and Gynecology) Harriett Sine, MD as Consulting Physician (Dermatology) Benson Norway, RN as Registered Nurse (Oncology) Eppie Gibson, MD as Attending Physician (Radiation Oncology) PCP: [name], MD OTHER MD:  CHIEF COMPLAINT: Estrogen receptor positive breast cancer  CURRENT TREATMENT: Anastrozole, fulvestrant, abemaciclib, denosumab/Xgeva  INTERVAL HISTORY: Meerab returns today for follow-up and treatment of her 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 She continues on anastrozole, with mild side effects of BUE and BLE arthralgias, myalgias, and bone pain. Her BUE and BLE pain is alleviated with walking along with Rx Oxycodone and Oxycontin. She takes 1 Senakot 3 times a week to prevent constipation. She has 3 bowel movements a week that are not hard. She denies nausea with anastrozole.    She also receives fulvestrant monthly, with a dose due today. She has soreness localized to the injection sites after administration.  She also receives denosumab/Xgeva monthly, scheduled together with the fulvestrant. She is not having pain following administration of denosumab.  Her M TOR inhibitor is abelaciclib--we went to this particular agent because it was relatively bone marrow sparing although more likely to cause diarrhea. She takes this medication BID and has 2 weeks left due to prior labs with decreased WBC count and being advised to d/c use for 2 weeks to stabilize WBC count. She denies having diarrhea. She has nausea with this medication that she treats with compazine and  zofran.    Pt reports that she is now off of all studies at this time.   Since the pt last visit to the office, she has had a bone survey met completed on 06/14/2017 with results showing again lytic and sclerotic lesions noted throughout ribs, spine, and pelvis. She also had a whole body bone scan completed on 06/14/2017 with results of: findings consistent with widespread skeletal metastatic disease.  She is also scheduled for unilateral right screening mammography 06/27/2017.   REVIEW OF SYSTEMS: Marthe reports that she is exercising more through balance and weight lifting exercises. She reports BUE and BLE arthalgias with anastrozole. She has nausea with abelaciclib that she treats with antiemetics. She denies unusual headaches, visual changes, nausea, vomiting, or dizziness. There has been no unusual cough, phlegm production, or pleurisy. This been no change in bowel or bladder habits. She denies unexplained fatigue or unexplained weight loss, bleeding, rash, or fever. A detailed review of systems was otherwise stable.   BREAST CANCER HISTORY: From the original intake note:  Yuktha had routine screening mammography with tomography [facility] February 20 11/14/2014 showing a possible mass in the right breast. Diagnostic right mammography with right breast ultrasonography 11/01/2014 found the breast density to be category C. In the upper outer quadrant of the right breast there was a partially obscured mass which on physical exam was palpable as an area of thickening at the 10:00 position 8 cm from the nipple. Ultrasound showed multiple cysts in the upper outer right breast corresponding to the mass in question.  In November 2016 the patient felt a change in the upper left breast and brought it to her primary care physician's attention. Diagnostic left mammography with tomosynthesis and left breast ultrasonography  at [facility] December 01/14/2015 found trabecular thickening  throughout the left breast with skin thickening, but no circumscribed mass or suspicious calcifications. A rounded mass in the left axilla measured 6 mm. There was an additional mildly prominent lymph node in the left axilla which was what the patient had been palpating. Ultrasonography showed ill-defined swelling throughout the inner left breast with overlying skin thickening.. At the 2:00 position 4 cm from the nipple there was an irregular hypoechoic mass measuring 2.3 cm. A second mass at the 1:00 area 3 cm from the nipple measured 1 cm. The distance between these 2 masses was 1.8 cm. There was also a round hypoechoic left axillary mass measuring 0.6 cm. Overall the was an area of 3.7 cm of mixed echogenicity corresponding to the area of palpable soft tissue swelling.   On 08/04/2015 the patient underwent biopsy of the mass at the 2:00 axis 4 cm from the nipple and a second biopsy was performed of the hypoechoic mass at the 1:00 position 3 cm from the nipple. Biopsy of the suspicious nodule in the lower left axilla was attempted but could not be completed as the mass became obscured after administration of lidocaine.  The pathology from both left breast biopsies 08/04/2015 ([id number]) showed invasive ductal carcinoma, grade 3. The prognostic profile obtained from the larger tumor showed estrogen receptor to be strongly positive at 95%, progesterone receptor positive at 30%, with an MIB-1 of 12%, and no HER-2 amplification, the signals ratio being 1.22 and the number per cell 2.20.  On 08/09/2015 the patient underwent biopsy of this suspicious left axillary lymph node noted above, and this showed ([id number]) metastatic carcinoma with extracapsular extension.  The patient's subsequent history is as detailed below    PAST MEDICAL HISTORY: Past Medical History:  Diagnosis Date  . Anxiety   . Asthma     seasonal asthma  . Breast cancer ([place])    metastatic breast cancer  . Complication of  anesthesia 2007   aspiration pneumonia post hysterectomy.  Blood pressure would drop low- but no problems 01/2016- surgery  . History of blood transfusion   . History of mitral valve prolapse 1988   with palpitations  . History of radiation therapy 04/02/16- 05/21/16   Left Chest Wall, SCV, Axilla, IM nodes  . Hypertension    no meds  . Neuropathy    with chemo  . Osteopenia determined by x-ray   . Pneumonia    1980's and 1990's  . Raynaud's disease   . 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 Skin cancer 03/12/2014   basal- back    PAST SURGICAL HISTORY: Past Surgical History:  Procedure Laterality Date  . ABDOMINAL HYSTERECTOMY  2007   with BSO  . APPENDECTOMY  2007  . AXILLARY LYMPH NODE DISSECTION Left 02/14/2016   Procedure: LEFT AXILLARY LYMPH NODE DISSECTION;  Surgeon: [name], MD;  Location: [facility];  Service: General;  Laterality: Left;  . BREAST RECONSTRUCTION WITH PLACEMENT OF TISSUE EXPANDER AND FLEX HD (ACELLULAR HYDRATED DERMIS) Left 02/02/2016   Procedure: IMMEDIATE LEFT BREAST RECONSTRUCTION WITH PLACEMENT OF TISSUE EXPANDER AND FLEX HD (ACELLULAR HYDRATED DERMIS);  Surgeon: [name], DO;  Location: [facility];  Service: Plastics;  Laterality: Left;  . BREAST REDUCTION SURGERY Right 03/28/2017   Procedure: RIGHT MAMMARY REDUCTION  (BREAST);  Surgeon: [name], DO;  Location: [facility];  Service: Plastics;  Laterality: Right;  .  BREAST SURGERY Left 02/02/16   mastectomy with reconstruction  . c-section  1991  . COLON RESECTION SIGMOID  12/02/2012   [facility]   . HERNIA REPAIR  2007  . LAPAROSCOPIC ENDOMETRIOSIS FULGURATION  1984, 1989  . MASTOPEXY Right 03/28/2017   Procedure: MASTOPEXY;  Surgeon: [name], DO;  Location: [facility];  Service: Plastics;  Laterality: Right;  . Port- a- cath placement  07/2015  . PORT-A-CATH REMOVAL Right 02/02/2016   Procedure:  REMOVAL PORT-A-CATH;  Surgeon: [name], MD;  Location: [facility];  Service: General;  Laterality: Right;  . RADIOACTIVE SEED GUIDED AXILLARY SENTINEL LYMPH NODE Left 02/02/2016   Procedure: LEFT BREAST MASTECTOMY AND RADIOACTIVE SEED GUIDED LEFT SENTINEL LYMPH NODE BIOPSY ;  Surgeon: [name], MD;  Location: [facility];  Service: General;  Laterality: Left;  . REMOVAL OF TISSUE EXPANDER AND PLACEMENT OF IMPLANT Left 03/28/2017   Procedure: REMOVAL OF TISSUE EXPANDER AND PLACEMENT OF IMPLANT;  Surgeon: [name], DO;  Location: [facility];  Service: Plastics;  Laterality: Left;    FAMILY HISTORY Family History  Problem Relation Age of Onset  . Lung cancer Father    the patient's father died from lung cancer at the age of 34 in the setting of tobacco abuse. The patient's mother died at the age of 4 with pancreatic cancer. The patient had no siblings. There is no history of breast or ovarian cancer in the family.  GYNECOLOGIC HISTORY:  No LMP recorded. Patient has had a hysterectomy. Menarche age 5, first live birth age 43. The patient is GX P1. She is status post total hysterectomy with bilateral salpingo-oophorectomy. She has been on hormone replacement since that time and is tapering to off at the time of this dictation.   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 She used to work at [employer]. Her husband [name] works for [employer] division at a [employer] their son [name] is currently at home.    ADVANCED DIRECTIVES: Not in place   HEALTH MAINTENANCE: Social History  Substance Use Topics  . Smoking status: Former Smoker    Packs/day: 1.00    Years: 10.00    Types: Cigarettes    Quit date: 11/25/2008  . Smokeless tobacco: Never Used     Comment: on and off smoker never consistent  . Alcohol use Yes     Comment: social     Colonoscopy: April 2014/ Raynaldo Opitz at [facility]  PAP: s/p  hysterectomy  Bone density: August 2016/ osteopenia  Lipid panel:  Allergies  Allergen Reactions  . Sulfa Antibiotics Other (See Comments)    Blisters in mouth    Current Outpatient Prescriptions  Medication Sig Dispense Refill  . Abemaciclib 100 MG TABS Take 100 mg by mouth 2 (two) times daily. 60 tablet 3  . anastrozole (ARIMIDEX) 1 MG tablet Take 1 mg by mouth daily.    . Ascorbic Acid (VITAMIN C) 1000 MG tablet Take 1,000 mg by mouth 2 (two) times daily.    . cetirizine (ZYRTEC) 10 MG tablet Take 10 mg by mouth daily.    . Cholecalciferol (VITAMIN D3) 2000 UNITS capsule Take 2,000 Units by mouth daily.     Marland Kitchen escitalopram (LEXAPRO) 10 MG tablet Take 1 tablet (10 mg total) by mouth daily. 30 tablet 12  . fluticasone (FLONASE) 50 MCG/ACT nasal spray Place 2 sprays into both nostrils daily. Reported on 11/18/2015    .  gabapentin (NEURONTIN) 300 MG capsule Take 1 capsule (300 mg total) by mouth at bedtime as needed. Take 2 tablets in AM, 2 tablets mid-day, and 4 tablets at bedtime 90 capsule 4  . LORazepam (ATIVAN) 0.5 MG tablet Take 1 tablet (0.5 mg total) by mouth at bedtime. 30 tablet 3  . Multiple Vitamin (MULTIVITAMIN) tablet Take 1 tablet by mouth daily.    . ondansetron (ZOFRAN) 8 MG tablet Take 1 tablet (8 mg total) by mouth every 8 (eight) hours as needed for nausea or vomiting. 20 tablet 0  . oxyCODONE (OXYCONTIN) 80 mg 12 hr tablet Take 1 tablet (80 mg total) by mouth every 8 (eight) hours. 90 tablet 0  . oxyCODONE (ROXICODONE) 15 MG immediate release tablet Take 1-2 tablets (15-30 mg total) by mouth every 4 (four) hours as needed for pain. 90 tablet 0  . Probiotic Product (PROBIOTIC DAILY PO) Take 1 capsule by mouth daily. [place] prochlorperazine (COMPAZINE) 10 MG tablet Take 1 tablet (10 mg total) by mouth every 6 (six) hours as needed (Nausea or vomiting). 30 tablet 2  . traZODone (DESYREL) 50 MG tablet TAKE 3 TABLETS AT BEDTIME 90  tablet 2  . zolpidem (AMBIEN) 5 MG tablet Take 1 tablet (5 mg total) by mouth at bedtime as needed for sleep. for sleep 30 tablet 2   No current facility-administered medications for this visit.     OBJECTIVE: Middle-aged white woman who appears stated age  56:   06/19/17 1100  BP: 136/70  Pulse: 96  Resp: 18  Temp: 98.5 F (36.9 C)  SpO2: 96%     Body mass index is 27.23 kg/m.    ECOG FS:2 - Symptomatic, <50% confined to bed  Sclerae unicteric, EOMs intact Oropharynx clear and moist No cervical or supraclavicular adenopathy Lungs no rales or rhonchi Heart regular rate and rhythm Abd soft, obese, nontender, positive bowel sounds MSK no focal spinal tenderness to moderate palpation, no upper extremity lymphedema Neuro: nonfocal, well oriented, anxious affect Breasts: The right breast is status post reduction mammoplasty. I do not palpate any suspicious findings. The left breast is status post mastectomy and radiation. There are some telangiectasias as a result of the radiation. There is no evidence of chest wall recurrence. Both axillae are benign.  LAB RESULTS:  CMP     Component Value Date/Time   NA 137 05/21/2017 1412   K 4.2 05/21/2017 1412   CL 110 02/15/2016 0330   CO2 25 05/21/2017 1412   GLUCOSE 85 05/21/2017 1412   BUN 14.6 05/21/2017 1412   CREATININE 0.7 05/21/2017 1412   CALCIUM 9.6 05/21/2017 1412   PROT 6.7 05/21/2017 1412   ALBUMIN 3.4 (L) 05/21/2017 1412   AST 81 (H) 05/21/2017 1412   ALT 69 (H) 05/21/2017 1412   ALKPHOS 69 05/21/2017 1412   BILITOT 0.42 05/21/2017 1412   GFRNONAA >60 02/15/2016 0330   GFRAA >60 02/15/2016 0330    INo results found for: SPEP, UPEP  Lab Results  Component Value Date   WBC 1.5 (L) 06/19/2017   NEUTROABS 0.8 (L) 06/19/2017   HGB 11.5 (L) 06/19/2017   HCT 34.3 (L) 06/19/2017   MCV 100.9 06/19/2017   PLT 181 06/19/2017      Chemistry      Component Value Date/Time   NA 137 05/21/2017 1412   K 4.2  05/21/2017 1412   CL 110 02/15/2016 0330   CO2 25 05/21/2017  1412   BUN 14.6 05/21/2017 1412   CREATININE 0.7 05/21/2017 1412      Component Value Date/Time   CALCIUM 9.6 05/21/2017 1412   ALKPHOS 69 05/21/2017 1412   AST 81 (H) 05/21/2017 1412   ALT 69 (H) 05/21/2017 1412   BILITOT 0.42 05/21/2017 1412       No results found for: LABCA2  No components found for: LABCA125  No results for input(s): INR in the last 168 hours.  Urinalysis    Component Value Date/Time   COLORURINE YELLOW 11/12/2015 [place] 11/12/2015 0837   LABSPEC 1.005 03/18/2017 1223   PHURINE 6.0 03/18/2017 1223   PHURINE 6.5 11/12/2015 0837   GLUCOSEU Negative 03/18/2017 1223   HGBUR Large 03/18/2017 1223   HGBUR MODERATE (A) 11/12/2015 0837   BILIRUBINUR Negative 03/18/2017 1223   KETONESUR Negative 03/18/2017 1223   KETONESUR NEGATIVE 11/12/2015 0837   PROTEINUR Negative 03/18/2017 1223   PROTEINUR NEGATIVE 11/12/2015 0837   UROBILINOGEN 0.2 03/18/2017 1223   NITRITE Negative 03/18/2017 1223   NITRITE NEGATIVE 11/12/2015 0837   LEUKOCYTESUR Moderate 03/18/2017 1223    STUDIES: Nm Bone Scan Whole Body  Result Date: 06/14/2017 CLINICAL DATA:  History of breast cancer, back pain EXAM: NUCLEAR MEDICINE WHOLE BODY BONE SCAN TECHNIQUE: Whole body anterior and posterior images were obtained approximately 3 hours after intravenous injection of radiopharmaceutical. RADIOPHARMACEUTICALS:  20.4 mCi Technetium-12mMDP IV COMPARISON:  Skeletal survey 06/14/2017, PET-CT 02/22/2017, lumbar spine CT 02/15/2017, CT 02/26/2017, bone scan 04/02/2016, 08/12/2015 FINDINGS: Physiologic activity in the kidneys and bladder. Numerous foci of activity involving the calvarium, sternum, multiple right greater than left ribs, iliac bones, and sacrum. Diffuse increased activity in the cervical, thoracic and lumbar spine. More intense focal activity at the lower thoracic spine, felt to correspond to probable  pathologic fractures previously demonstrated at T10 and T11. Faint foci of activity in the mid left femur. Foci of activity in the right scapula. Prominent left central skullbase activity. IMPRESSION: Findings consistent with widespread skeletal metastatic disease as described above, corresponding to the findings on most recent PET-CT. Electronically Signed   By: KDonavan FoilM.D.   On: 06/14/2017 14:46   Dg Bone Survey Met  Result Date: 06/14/2017 CLINICAL DATA:  Breast cancer with known metastatic disease. Back pain. EXAM: METASTATIC BONE SURVEY COMPARISON:  PET-CT 02/22/2017. Chest CT 08/12/1999 16. CT abdomen 12/24/2014 FINDINGS: Prior left mastectomy. Carotid vascular calcification. Aortic atherosclerotic vascular calcification. Mild left upper lobe and right lower lobe infiltrates. Small left pleural effusion . Diffuse degenerative change noted throughout spine. Diffuse osteopenia. Sclerotic and lytic lesions noted throughout the ribs, spine, pelvis consistent with known metastatic disease. Spine is numbered with the lowest ribbed vertebral body as T12. Mild L1 compression fracture noted. Mild L1 compression fracture noted, this appears stable. Reference made to recent PET-CT for discussion of other bony lesions present and best demonstrated by PET-CT. IMPRESSION: 1.  Carotid and peripheral vascular disease. 2. Mild left upper lobe and right lower lobe infiltrates cannot be excluded. Small left pleural effusion. 3. Multiple lytic and sclerotic lesions again noted throughout the ribs, spine, and pelvis. Mild T11 and T12 compression fractures appear to be present. These appear new. Old stable L1 compression fracture noted. Reference is made to prior recent PET-CT report for discussion of other bony findings present and best demonstrated by PET-CT. Electronically Signed   By: TMarcello Moores Register   On: 06/14/2017 11:09     PROTOCOLS:  participated in [facility] and PALLAS studies, currently off study    ASSESSMENT: 56 y.o. [place] woman status post biopsy of 2 separate masses in the left breast upper outer quadrant 08/04/2015, clinically mT2 N1, stage IIb/IIIa invasive ductal carcinoma, grade 3, the larger mass being estrogen and progesterone receptor positive, HER-2 negative, with an MIB-1 of 12%  (a) biopsy of a left axillary lymph node 08/09/2015 was positive, with extracapsular extension  (1) neoadjuvant chemotherapy started 08/25/2015 consisting of doxorubicin and cyclophosphamide in dose dense fashion 4, completed 10/06/2015,  followed by weekly paclitaxel 10 completed 12/23/2015  (a) final 2 planned cycles of weekly paclitaxel omitted because of neuropathy concerns  (2) status post left mastectomy with targeted axillary dissection 02/02/2016 for a ypT2 ypN2a, stage IIIa invasive ductal carcinoma, grade 2, with negative margins, repeat prognostic panel showing estrogen receptor positive, but progesterone receptor and HER-2 negative  (a) completion axillary lymph node dissection 02/14/2016 showed an additional 2 out of 8 lymph nodes to be involved (final count 6 out of 10 lymph nodes positive  (b) expander placement at the time of left mastectomy  (3) adjuvant capecitabine starting concurrently with radiation--discontinued 05/21/2016  (4) adjuvant  radiation started 04/02/2016, completed 05/21/2016  (5) anastrozole started October 2017  (a) bone density August 2016 showed osteopenia  (b) repeat bone density 09/07/2016 shows a T score of -1.7.  (c) the patient is status post total abdominal hysterectomy with bilateral salpingo-oophorectomy.  (6) postoperative pain?: Starting OxyContin 20 mg twice a day with oxycodone as needed for breakthrough pain 03/02/2016  (a) failed transition to tramadol, gabapentin, naproxen   (b) started methadone 5 mg TID as of 12/20/2016-- not tolerated  (c) robaxin added June 2018 to NSAIDS and tylenol  (7) Research:  (a) B-WEL study ([facility]  [id number])--enrolled in Arm 1 (received education)  (b) PALLAS AFT-05 study: randomized to treatment, started palbociclib 07/26/2016   (i) dose reduced to 100 mg, then to 75 mg because of cytopenias   (ii) went off study study May 2018 because of persistent cytopenias  (c) taxane study 12/20/2016  (d) aspirin study  (e) went off studies as metastatic disease developed in June 2018  METASTATIC DISEASE 02/15/2017: CT scan of the lumbar spine and 02/27/2017 CT of the thoracic and cervical spine shows multiple bone lesions but no evidence of cord compromise; bone lesions are confirmed on PET scan 02/22/2017 which however shows no evidence of visceral disease  (a) CA-27-29 is noninformative  (8) continuing anastrozole, abemaciclib added 02/22/2017, fulvestrant started 02/22/2017 ,   (9) yearly zolendronate given 09/26/2016, changed to monthly denosumab/Xgeva starting 03/19/2017  (10) radiation 04/01/2017 - 04/23/2017 Site/dose:   The lumbar spine was treated to 35 Gy in 14 fractions of 2.5 Gy.   Other issues: (a) poorly controlled pain: Currently on OxyContin 80 mg po TID/ oxycodone 15-30 mg Q 4h PRN  (i) bowel prophylaxis in place  (b) left upper lobe changes noted on PET scan 02/22/2017 felt to be secondary to radiation  (c) [facility] review suggested discontinuing aromatase inhibitor (she does uncomfortable with that suggestion), and obtaining a bone survey, which was not     PLAN:  I spent approximately 45 minutes today with Magda Paganini and her husband going over her complex situation. She understands that patients with bone only disease may find it difficult to know if treatment is being effective. If the lytic lesion becomes sclerotic, that may be a form of healing, but it will locally can new lesion on the bone scan. If a  bone lesion is becoming more sclerotic it may be because of positive inflammation or healing or it may be because of progression.   Accordingly I am reading her current  bone scan and bone survey as the true new baseline. It does not help of course that she has so many lesions over her skeleton. I did show her the images today in detail  What I think is significant is that clinically she is better. Her pain is better controlled, she is much more active than before, and perhaps this is the single best measurement we can do at this point.  It will be helpful to repeat a PET scan and I have set that up for just before Christmas. We will be able to compare that of course with the one from June, but the real reason to obtain it is to make sure she has not developed visceral disease.  I will probably repeat a bone scan and bone survey in February.  She is not tolerating the abelaciclib beyond 2 weeks because of counts. We are going to do abelaciclib the first through 15th days of each month and she will take the last 2 weeks of each month off to give her bone marrow a bit of rest.  I again suggested that it is fine with me to get more than 1 second opinion and if she wished to be evaluated for example at [facility] in addition to the distant reviews from [facility], I would be glad to help her set that up  Otherwise we are continuing treatment as before. She will see our physicians assistant within November denosumab/fulvestrant treatments and she will see me again in January after her December PET scan  She knows to call for any other issues that may develop before the next visit here.  Felice Deem, Virgie Dad, MD  06/19/17 11:32 AM Medical Oncology and Hematology [facility] [address] [place], [place] [address] Tel. [phone]    Fax. [phone]  This document serves as a record of services personally performed by Lurline Del, MD. It was created on her behalf by Steva Colder, a trained medical scribe. The creation of this record is based on the scribe's personal observations and the provider's statements to them. This document has been checked  and approved by the attending provider.

## 2017-06-18 ENCOUNTER — Other Ambulatory Visit: Payer: Self-pay | Admitting: Oncology

## 2017-06-18 ENCOUNTER — Ambulatory Visit: Payer: 59 | Admitting: Physical Therapy

## 2017-06-18 DIAGNOSIS — G8929 Other chronic pain: Secondary | ICD-10-CM

## 2017-06-18 DIAGNOSIS — R293 Abnormal posture: Secondary | ICD-10-CM | POA: Diagnosis not present

## 2017-06-18 DIAGNOSIS — M25612 Stiffness of left shoulder, not elsewhere classified: Secondary | ICD-10-CM

## 2017-06-18 DIAGNOSIS — I890 Lymphedema, not elsewhere classified: Secondary | ICD-10-CM

## 2017-06-18 DIAGNOSIS — M5440 Lumbago with sciatica, unspecified side: Secondary | ICD-10-CM

## 2017-06-18 DIAGNOSIS — M25512 Pain in left shoulder: Secondary | ICD-10-CM

## 2017-06-18 NOTE — Therapy (Signed)
[place], [place], [address] Phone: [phone]   Fax:  [phone]  Physical Therapy Treatment  Patient Details  Name: Alison [name] MRN: [id number] Date of Birth: Mar 15, 1961 Referring Provider: Dr. [name]  Encounter Date: 06/18/2017      PT End of Session - 06/18/17 1714    Visit Number 42   Number of Visits 55   Date for PT Re-Evaluation 06/26/17   PT Start Time 2119   PT Stop Time 1430   PT Time Calculation (min) 45 min   Activity Tolerance Patient tolerated treatment well   Behavior During Therapy [facility] for tasks assessed/performed      Past Medical History:  Diagnosis Date  . Anxiety   . Asthma     seasonal asthma  . Breast cancer ([place])    metastatic breast cancer  . Complication of anesthesia 2007   aspiration pneumonia post hysterectomy.  Blood pressure would drop low- but no problems 01/2016- surgery  . History of blood transfusion   . History of mitral valve prolapse 1988   with palpitations  . History of radiation therapy 04/02/16- 05/21/16   Left Chest Wall, SCV, Axilla, IM nodes  . Hypertension    no meds  . Neuropathy    with chemo  . Osteopenia determined by x-ray   . Pneumonia    1980's and 1990's  . Raynaud's disease   . 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 Skin cancer 03/12/2014   basal- back    Past Surgical History:  Procedure Laterality Date  . ABDOMINAL HYSTERECTOMY  2007   with BSO  . APPENDECTOMY  2007  . AXILLARY LYMPH NODE DISSECTION Left 02/14/2016   Procedure: LEFT AXILLARY LYMPH NODE DISSECTION;  Surgeon: [name], MD;  Location: [place];  Service: General;  Laterality: Left;  . BREAST RECONSTRUCTION WITH PLACEMENT OF TISSUE EXPANDER AND FLEX HD (ACELLULAR HYDRATED DERMIS) Left 02/02/2016   Procedure: IMMEDIATE LEFT BREAST RECONSTRUCTION WITH PLACEMENT OF TISSUE EXPANDER AND FLEX HD (ACELLULAR HYDRATED DERMIS);   Surgeon: [name], DO;  Location: [place];  Service: Plastics;  Laterality: Left;  . BREAST REDUCTION SURGERY Right 03/28/2017   Procedure: RIGHT MAMMARY REDUCTION  (BREAST);  Surgeon: [name], DO;  Location: [place];  Service: Plastics;  Laterality: Right;  . BREAST SURGERY Left 02/02/16   mastectomy with reconstruction  . c-section  1991  . COLON RESECTION SIGMOID  12/02/2012   [facility]   . HERNIA REPAIR  2007  . LAPAROSCOPIC ENDOMETRIOSIS FULGURATION  1984, 1989  . MASTOPEXY Right 03/28/2017   Procedure: MASTOPEXY;  Surgeon: [name], DO;  Location: [place];  Service: Plastics;  Laterality: Right;  . Port- a- cath placement  07/2015  . PORT-A-CATH REMOVAL Right 02/02/2016   Procedure: REMOVAL PORT-A-CATH;  Surgeon: [name], MD;  Location: [facility];  Service: General;  Laterality: Right;  . RADIOACTIVE SEED GUIDED AXILLARY SENTINEL LYMPH NODE Left 02/02/2016   Procedure: LEFT BREAST MASTECTOMY AND RADIOACTIVE SEED GUIDED LEFT SENTINEL LYMPH NODE BIOPSY ;  Surgeon: [name], MD;  Location: [place];  Service: General;  Laterality: Left;  . REMOVAL OF TISSUE EXPANDER AND PLACEMENT OF IMPLANT Left 03/28/2017   Procedure: REMOVAL OF TISSUE EXPANDER AND PLACEMENT OF IMPLANT;  Surgeon: [name], DO;  Location: [place];  Service: Plastics;  Laterality: Left;  There were no vitals filed for this visit.      Subjective Assessment - 06/18/17 1356    Subjective Pt states she is a little sore today.  She has been exercising but not as much as we usually do.  Pt got her sleeve and feels that it fits well.  She finds out tomorrow if her treatment is working    Pertinent History Breast cancer diagnosed November2016 She had chemotherapy. but did have some neuropathy so it had to be stopped. On June 8, she had a mastectomy with immedicate expander placement  and with first set of lymphnodes removed, On June 20 she went for complete axillary lymph node dissection. She says she has had always been "full necked" but has neck and upper body fullness since the cancer was diagnosed. She reports she has osteopenia in low back and has had a fracture that was discovered in 2014 when she had a Dexa scan. Pt has had metastasis to the bones of the spine and recent  bilateral breast surgery for a reduction on the right and expander removal on the left with implant placement and removal    Patient Stated Goals to decrease the pain    Currently in Pain? Yes   Pain Score 5    Pain Location Back   Pain Orientation Mid   Pain Descriptors / Indicators Aching   Pain Type Chronic pain                         [facility] Adult PT Treatment/Exercise - 06/18/17 0001      Balance Poses: Yoga   Warrior I 2 reps;Limitations   Warrior I Limitations both hands overhead      Lumbar Exercises: Aerobic   Tread Mill 0% grad 1.5 mph for 10 nutes   last minute was at 1.7      Lumbar Exercises: Standing   Wall Slides 5 reps;5 seconds   Other Standing Lumbar Exercises marches and sidestepping x 40 feet    Other Standing Lumbar Exercises walking on toes and on heels x 40 feet      Knee/Hip Exercises: Standing   Hip Abduction Stengthening;Right;Left;10 reps   Forward Step Up Right;Left;10 reps  6 inch step    Forward Step Up Limitations holding 2 dowel rods for balance    Step Down Right;Left;10 reps   Other Standing Knee Exercises walking 75 feet carrying 4# weight close to body      Shoulder Exercises: Standing   Other Standing Exercises on airex pad, 3# for elbow flexion x 10 reps of bicep curls x 2 sets      Shoulder Exercises: ROM/Strengthening   Wall Wash --  wall slide 2 reps with each arm in pain limit                   Short Term Clinic Goals - 05/16/17 1757      CC Short Term Goal  #1   Title Pt will report she knows strategies for  good body mechanics to decrease low back pain    Time 4   Period Weeks   Status On-going     CC Short Term Goal  #2   Title Pt will have a deacrease in circumference 15 cm prox to ulnar styloid of left forearm by 1 cm    Baseline 25 cm , on 05/16/2017 it is 23.5   Status Achieved     CC Short Term Goal  #  3   Title Pt will report she knows how to manage left UE lymphedema with use of compression, elevation and exercise, self manual lymph drainage and possibly Flexitouch    Baseline 05/16/2017, has nighttime garment, daytime is ordered and is waiting on [place] - 05/16/17 1759      CC Long Term Goal  #1   Title Patient will report  that she can manage back pain  so she can perform daily activities with greater ease   Time 8   Period Weeks   Status On-going     CC Long Term Goal  #2   Title Pt will  have given the TENS unit a trial to see if it will help with back pain    Baseline has not needed to use TENS on 05/16/2017   Time 8   Period Weeks   Status On-going     CC Long Term Goal  #3   Title Pt will be independent in core and  strengthening exercises    Time 8   Period Weeks   Status On-going     CC Long Term Goal  #4   Title Pt will report low back pain is decreased to a 4/10 so that she can perform activities of daily living easier    Time 8   Period Weeks   Status On-going            Plan - 06/18/17 1714    Clinical Impression Statement Pt has a little bit of back pain today, but she is motivated to keep exercising. she notices improvement in balance    Clinical Impairments Affecting Rehab Potential chemotherapy with neuropathy , radiation to back    PT Next Visit Plan  increase to 1/7 mph, 8 inch step. continue treadmill. continue with dynamic gait and LE strengthening exercise. continue with BOSU or airex. add walking lunges  Monitor lymphedema, continue standing isometrics and progress core  general  exercise for strengthening and pain managment with TENS as needed. MLD as needed for pain managment of left axillary discomfort    Consulted and Agree with Plan of Care Patient      Patient will benefit from skilled therapeutic intervention in order to improve the following deficits and impairments:  Decreased range of motion, Impaired UE functional use, Decreased activity tolerance, Decreased knowledge of precautions, Decreased skin integrity, Impaired perceived functional ability, Pain, Decreased knowledge of use of DME, Increased edema, Postural dysfunction, Decreased strength, Impaired sensation, Decreased scar mobility, Decreased mobility, Increased muscle spasms  Visit Diagnosis: Abnormal posture  Chronic left shoulder pain  Lymphedema, not elsewhere classified  Low back pain with sciatica, sciatica laterality unspecified, unspecified back pain laterality, unspecified chronicity  Stiffness of left shoulder joint     Problem List Patient Active Problem List   Diagnosis Date Noted  . Counseling regarding goals of care 04/16/2017  . Bone metastases ([place]) 02/28/2017  . Uncontrolled pain 02/28/2017  . Osteopenia determined by x-ray 09/20/2016  . Primary malignant neoplasm of left breast with stage 2 nodal metastasis per American Joint Committee on Cancer 7th edition (N2) ([place]) 02/14/2016  . Chemotherapy-induced neuropathy ([place]) 12/30/2015  . Insomnia 09/01/2015  . Malignant neoplasm of upper-outer quadrant of left breast in female, estrogen receptor positive ([place]) 08/08/2015   Donato Heinz. Owens Shark, PT  Norwood Levo 06/18/2017, 5:16 PM  [facility]  [place], [place], [address] Phone: [phone]   Fax:  [phone]  Name: Alison [name] MRN: [id number] Date of Birth: 09-10-1960

## 2017-06-19 ENCOUNTER — Telehealth: Payer: Self-pay | Admitting: Oncology

## 2017-06-19 ENCOUNTER — Ambulatory Visit (HOSPITAL_BASED_OUTPATIENT_CLINIC_OR_DEPARTMENT_OTHER): Payer: 59 | Admitting: Oncology

## 2017-06-19 ENCOUNTER — Ambulatory Visit (HOSPITAL_BASED_OUTPATIENT_CLINIC_OR_DEPARTMENT_OTHER): Payer: 59

## 2017-06-19 ENCOUNTER — Other Ambulatory Visit (HOSPITAL_BASED_OUTPATIENT_CLINIC_OR_DEPARTMENT_OTHER): Payer: 59

## 2017-06-19 VITALS — BP 136/70 | HR 96 | Temp 98.5°F | Resp 18 | Ht 62.0 in | Wt 148.9 lb

## 2017-06-19 DIAGNOSIS — Z170 Estrogen receptor positive status [ER+]: Secondary | ICD-10-CM

## 2017-06-19 DIAGNOSIS — M8580 Other specified disorders of bone density and structure, unspecified site: Secondary | ICD-10-CM

## 2017-06-19 DIAGNOSIS — Z5111 Encounter for antineoplastic chemotherapy: Secondary | ICD-10-CM

## 2017-06-19 DIAGNOSIS — C50412 Malignant neoplasm of upper-outer quadrant of left female breast: Secondary | ICD-10-CM

## 2017-06-19 DIAGNOSIS — C50912 Malignant neoplasm of unspecified site of left female breast: Secondary | ICD-10-CM | POA: Diagnosis not present

## 2017-06-19 DIAGNOSIS — C7951 Secondary malignant neoplasm of bone: Secondary | ICD-10-CM | POA: Diagnosis not present

## 2017-06-19 DIAGNOSIS — C779 Secondary and unspecified malignant neoplasm of lymph node, unspecified: Secondary | ICD-10-CM

## 2017-06-19 DIAGNOSIS — C773 Secondary and unspecified malignant neoplasm of axilla and upper limb lymph nodes: Secondary | ICD-10-CM | POA: Diagnosis not present

## 2017-06-19 LAB — CBC WITH DIFFERENTIAL/PLATELET
BASO%: 0.4 % (ref 0.0–2.0)
BASOS ABS: 0 10*3/uL (ref 0.0–0.1)
EOS ABS: 0.1 10*3/uL (ref 0.0–0.5)
EOS%: 4.4 % (ref 0.0–7.0)
HEMATOCRIT: 34.3 % — AB (ref 34.8–46.6)
HEMOGLOBIN: 11.5 g/dL — AB (ref 11.6–15.9)
LYMPH#: 0.4 10*3/uL — AB (ref 0.9–3.3)
LYMPH%: 27.8 % (ref 14.0–49.7)
MCH: 34 pg (ref 25.1–34.0)
MCHC: 33.7 g/dL (ref 31.5–36.0)
MCV: 100.9 fL (ref 79.5–101.0)
MONO#: 0.2 10*3/uL (ref 0.1–0.9)
MONO%: 12.9 % (ref 0.0–14.0)
NEUT%: 54.5 % (ref 38.4–76.8)
NEUTROS ABS: 0.8 10*3/uL — AB (ref 1.5–6.5)
Platelets: 181 10*3/uL (ref 145–400)
RBC: 3.39 10*6/uL — ABNORMAL LOW (ref 3.70–5.45)
RDW: 14.8 % — AB (ref 11.2–14.5)
WBC: 1.5 10*3/uL — ABNORMAL LOW (ref 3.9–10.3)

## 2017-06-19 LAB — COMPREHENSIVE METABOLIC PANEL
ALBUMIN: 3.5 g/dL (ref 3.5–5.0)
ALK PHOS: 62 U/L (ref 40–150)
ALT: 88 U/L — ABNORMAL HIGH (ref 0–55)
AST: 116 U/L — AB (ref 5–34)
Anion Gap: 10 mEq/L (ref 3–11)
BILIRUBIN TOTAL: 0.32 mg/dL (ref 0.20–1.20)
BUN: 6.6 mg/dL — AB (ref 7.0–26.0)
CALCIUM: 8.7 mg/dL (ref 8.4–10.4)
CO2: 25 mEq/L (ref 22–29)
CREATININE: 0.7 mg/dL (ref 0.6–1.1)
Chloride: 106 mEq/L (ref 98–109)
EGFR: >60 mL/min/{1.73_m2} (ref >60)
Glucose: 88 mg/dl (ref 70–140)
POTASSIUM: 4.2 meq/L (ref 3.5–5.1)
Sodium: 140 mEq/L (ref 136–145)
TOTAL PROTEIN: 6.7 g/dL (ref 6.4–8.3)

## 2017-06-19 MED ORDER — ONDANSETRON HCL 8 MG PO TABS: 8.0000 mg | ORAL_TABLET | Freq: Three times a day (TID) | ORAL | 0 refills | Status: DC | PRN

## 2017-06-19 MED ORDER — DENOSUMAB 120 MG/1.7ML ~~LOC~~ SOLN
120.0000 mg | Freq: Once | SUBCUTANEOUS | Status: AC
  Administered 2017-06-19: 120 mg via SUBCUTANEOUS
  Filled 2017-06-19: qty 1.7

## 2017-06-19 MED ORDER — OXYCODONE HCL ER 80 MG PO T12A: 80.0000 mg | EXTENDED_RELEASE_TABLET | Freq: Three times a day (TID) | ORAL | 0 refills | Status: DC

## 2017-06-19 MED ORDER — PROCHLORPERAZINE MALEATE 10 MG PO TABS: 10.0000 mg | ORAL_TABLET | Freq: Four times a day (QID) | ORAL | 2 refills | Status: DC | PRN

## 2017-06-19 MED ORDER — FULVESTRANT 250 MG/5ML IM SOLN
500.0000 mg | Freq: Once | INTRAMUSCULAR | Status: AC
  Administered 2017-06-19: 500 mg via INTRAMUSCULAR
  Filled 2017-06-19: qty 10

## 2017-06-19 MED ORDER — OXYCODONE HCL 15 MG PO TABS: 15.0000 mg | ORAL_TABLET | ORAL | 0 refills | Status: DC | PRN

## 2017-06-19 NOTE — Telephone Encounter (Signed)
Gave patient avs and calendar with appts per 10/24 los.  °

## 2017-06-20 ENCOUNTER — Encounter: Payer: 59 | Admitting: Physical Therapy

## 2017-06-25 ENCOUNTER — Ambulatory Visit: Payer: 59 | Admitting: Physical Therapy

## 2017-06-25 DIAGNOSIS — R293 Abnormal posture: Secondary | ICD-10-CM | POA: Diagnosis not present

## 2017-06-25 DIAGNOSIS — M5440 Lumbago with sciatica, unspecified side: Secondary | ICD-10-CM

## 2017-06-25 DIAGNOSIS — I890 Lymphedema, not elsewhere classified: Secondary | ICD-10-CM

## 2017-06-25 NOTE — Therapy (Signed)
[place], [place], [address] Phone: [phone]   Fax:  [phone]  Physical Therapy Treatment  Patient Details  Name: Alison [name] MRN: [id number] Date of Birth: 25-Sep-1960 Referring Provider: Dr. [name]  Encounter Date: 06/25/2017      PT End of Session - 06/25/17 1744    Visit Number 46  28 this year   Number of Visits 55   Date for PT Re-Evaluation 08/26/17   PT Start Time 8527   PT Stop Time 1430   PT Time Calculation (min) 45 min   Activity Tolerance Patient tolerated treatment well   Behavior During Therapy [facility] for tasks assessed/performed      Past Medical History:  Diagnosis Date  . Anxiety   . Asthma     seasonal asthma  . Breast cancer ([place])    metastatic breast cancer  . Complication of anesthesia 2007   aspiration pneumonia post hysterectomy.  Blood pressure would drop low- but no problems 01/2016- surgery  . History of blood transfusion   . History of mitral valve prolapse 1988   with palpitations  . History of radiation therapy 04/02/16- 05/21/16   Left Chest Wall, SCV, Axilla, IM nodes  . Hypertension    no meds  . Neuropathy    with chemo  . Osteopenia determined by x-ray   . Pneumonia    1980's and 1990's  . Raynaud's disease   . 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 Skin cancer 03/12/2014   basal- back    Past Surgical History:  Procedure Laterality Date  . ABDOMINAL HYSTERECTOMY  2007   with BSO  . APPENDECTOMY  2007  . AXILLARY LYMPH NODE DISSECTION Left 02/14/2016   Procedure: LEFT AXILLARY LYMPH NODE DISSECTION;  Surgeon: [name], MD;  Location: [place];  Service: General;  Laterality: Left;  . BREAST RECONSTRUCTION WITH PLACEMENT OF TISSUE EXPANDER AND FLEX HD (ACELLULAR HYDRATED DERMIS) Left 02/02/2016   Procedure: IMMEDIATE LEFT BREAST RECONSTRUCTION WITH PLACEMENT OF TISSUE EXPANDER AND FLEX HD (ACELLULAR  HYDRATED DERMIS);  Surgeon: [name], DO;  Location: [place];  Service: Plastics;  Laterality: Left;  . BREAST REDUCTION SURGERY Right 03/28/2017   Procedure: RIGHT MAMMARY REDUCTION  (BREAST);  Surgeon: [name], DO;  Location: [place];  Service: Plastics;  Laterality: Right;  . BREAST SURGERY Left 02/02/16   mastectomy with reconstruction  . c-section  1991  . COLON RESECTION SIGMOID  12/02/2012   [facility]   . HERNIA REPAIR  2007  . LAPAROSCOPIC ENDOMETRIOSIS FULGURATION  1984, 1989  . MASTOPEXY Right 03/28/2017   Procedure: MASTOPEXY;  Surgeon: [name], DO;  Location: [place];  Service: Plastics;  Laterality: Right;  . Port- a- cath placement  07/2015  . PORT-A-CATH REMOVAL Right 02/02/2016   Procedure: REMOVAL PORT-A-CATH;  Surgeon: [name], MD;  Location: [facility];  Service: General;  Laterality: Right;  . RADIOACTIVE SEED GUIDED AXILLARY SENTINEL LYMPH NODE Left 02/02/2016   Procedure: LEFT BREAST MASTECTOMY AND RADIOACTIVE SEED GUIDED LEFT SENTINEL LYMPH NODE BIOPSY ;  Surgeon: [name], MD;  Location: [facility];  Service: General;  Laterality: Left;  . REMOVAL OF TISSUE EXPANDER AND PLACEMENT OF IMPLANT Left 03/28/2017   Procedure: REMOVAL OF TISSUE EXPANDER AND PLACEMENT OF IMPLANT;  Surgeon: [name], DO;  Location: [place];  Service: Plastics;  Laterality: Left;    There were no vitals filed for this visit.      Subjective Assessment - 06/25/17 1736    Subjective Pt reports she had a visit with Dr. [name].  She was surprised to see that she had numerous areas of spinal involvement. She says she thinks the exercise is benefiical and wants to continue  She only had one session of PT last week and notices she feels a little weaker at start of session today    Pertinent History Breast cancer diagnosed November2016 She had chemotherapy.  but did have some neuropathy so it had to be stopped. On June 8, she had a mastectomy with immedicate expander placement and with first set of lymphnodes removed, On June 20 she went for complete axillary lymph node dissection. She says she has had always been "full necked" but has neck and upper body fullness since the cancer was diagnosed. She reports she has osteopenia in low back and has had a fracture that was discovered in 2014 when she had a Dexa scan. Pt has had metastasis to the bones of the spine and recent  bilateral breast surgery for a reduction on the right and expander removal on the left with implant placement and removal    Patient Stated Goals to decrease the pain    Currently in Pain? No/denies            [facility] PT Assessment - 06/25/17 0001      Assessment   Medical Diagnosis breast cancer    Referring Provider Dr. [name]   Onset Date/Surgical Date 07/03/15     Prior Function   Level of Independence Independent                     [facility] Adult PT Treatment/Exercise - 06/25/17 0001      Ambulation/Gait   Stairs Yes   Stair Management Technique Two rails;Alternating pattern;Backwards   Number of Stairs 6   Height of Stairs 4   Gait Comments Pt has more difficulty walking down stairs than walking up them She needs to hold onto rails as she occasionally loses her balance.      High Level Balance   High Level Balance Activities Side stepping;Backward walking;Marching forwards     Balance Poses: Yoga   Warrior I 2 Engineer, production I Limitations arms held at shoulder height      Lumbar Exercises: Aerobic   Tread Mill 0% grad 1.5 mph for 10 nutes   last minute was at 1.7      Lumbar Exercises: Standing   Heel Raises 5 reps   Wall Slides 5 reps;5 seconds   Other Standing Lumbar Exercises marches and sidestepping x 40 feet    Other Standing Lumbar Exercises walking on toes and on heels x 40 feet      Knee/Hip Exercises: Standing   Lateral Step Up  Right;Left;10 reps;Step Height: 6";Other (comment)   Lateral Step Up Limitations holding onto dowel for balance    Forward Step Up Right;Left;10 reps;Step Height: 6"   Step Down Right;Left;10 reps   Other Standing Knee Exercises walking 75 feet carrying 4# weight close to body                    Short Term Clinic Goals - 06/25/17 1750      CC Short Term Goal  #1   Title Pt will report she knows strategies for good body mechanics to decrease low  back pain    Time 8   Period Weeks   Status On-going     CC Short Term Goal  #2   Title Pt will have a deacrease in circumference 15 cm prox to ulnar styloid of left forearm by 1 cm    Baseline 25 cm , on 05/16/2017 it is 23.5   Status Achieved     CC Short Term Goal  #3   Title Pt will report she knows how to manage left UE lymphedema with use of compression, elevation and exercise, self manual lymph drainage and possibly Flexitouch    Baseline Pt has day and nighttime garments and Flexitouch    Status Achieved             Long Term Clinic Goals - 06/25/17 1751      CC Long Term Goal  #1   Title Patient will report  that she can manage back pain  so she can perform daily activities with greater ease   Status On-going     CC Long Term Goal  #2   Title Pt will  have given the TENS unit a trial to see if it will help with back pain    Baseline has not needed to use TENS    Time 8   Period Weeks   Status On-going     CC Long Term Goal  #3   Title Pt will be independent in core and  LE strengthening exercises    Time 8   Period Weeks   Status On-going     CC Long Term Goal  #4   Title Pt will report low back pain is decreased to a 4/10 so that she can perform activities of daily living easier    Status Achieved            Plan - 06/25/17 1749    Clinical Impression Statement Pt is responding well to exercise and feels that it is improving her function.  She wants to continue coming to PT   Clinical  Impairments Affecting Rehab Potential chemotherapy with neuropathy , radiation to back    PT Next Visit Plan  increase to 1/7 mph, 8 inch step. continue treadmill. continue with dynamic gait and LE strengthening exercise. continue with BOSU or airex. add walking lunges  Monitor lymphedema, continue standing isometrics and progress core  general exercise for strengthening and pain managment with TENS as needed. MLD as needed for pain managment of left axillary discomfort    Consulted and Agree with Plan of Care Patient      Patient will benefit from skilled therapeutic intervention in order to improve the following deficits and impairments:  Decreased range of motion, Impaired UE functional use, Decreased activity tolerance, Decreased knowledge of precautions, Decreased skin integrity, Impaired perceived functional ability, Pain, Decreased knowledge of use of DME, Increased edema, Postural dysfunction, Decreased strength, Impaired sensation, Decreased scar mobility, Decreased mobility, Increased muscle spasms  Visit Diagnosis: Abnormal posture  Low back pain with sciatica, sciatica laterality unspecified, unspecified back pain laterality, unspecified chronicity  Lymphedema, not elsewhere classified     Problem List Patient Active Problem List   Diagnosis Date Noted  . Counseling regarding goals of care 04/16/2017  . Bone metastases ([place]) 02/28/2017  . Uncontrolled pain 02/28/2017  . Osteopenia determined by x-ray 09/20/2016  . Primary malignant neoplasm of left breast with stage 2 nodal metastasis per American Joint Committee on Cancer 7th edition (N2) ([place]) 02/14/2016  . Chemotherapy-induced neuropathy (  [place]) 12/30/2015  . Insomnia 09/01/2015  . Malignant neoplasm of upper-outer quadrant of left breast in female, estrogen receptor positive ([place]) 08/08/2015   Donato Heinz. Owens Shark PT  [place] Levo 06/25/2017, 6:00 PM  [place], [place], [address] Phone: [phone]   Fax:  [phone]  Name: Alison [name] MRN: [id number] Date of Birth: 1961-02-07

## 2017-06-26 ENCOUNTER — Other Ambulatory Visit: Payer: Self-pay | Admitting: Oncology

## 2017-06-27 ENCOUNTER — Ambulatory Visit: Payer: 59 | Admitting: Physical Therapy

## 2017-06-27 ENCOUNTER — Ambulatory Visit: Payer: 59

## 2017-07-02 ENCOUNTER — Other Ambulatory Visit: Payer: Self-pay | Admitting: *Deleted

## 2017-07-02 DIAGNOSIS — N63 Unspecified lump in unspecified breast: Secondary | ICD-10-CM

## 2017-07-02 DIAGNOSIS — Z170 Estrogen receptor positive status [ER+]: Principal | ICD-10-CM

## 2017-07-02 DIAGNOSIS — C7951 Secondary malignant neoplasm of bone: Secondary | ICD-10-CM

## 2017-07-02 DIAGNOSIS — N6459 Other signs and symptoms in breast: Secondary | ICD-10-CM

## 2017-07-02 DIAGNOSIS — C50412 Malignant neoplasm of upper-outer quadrant of left female breast: Secondary | ICD-10-CM

## 2017-07-03 ENCOUNTER — Telehealth: Payer: Self-pay

## 2017-07-03 ENCOUNTER — Other Ambulatory Visit: Payer: Self-pay | Admitting: Oncology

## 2017-07-03 ENCOUNTER — Ambulatory Visit: Payer: 59 | Attending: General Surgery | Admitting: Physical Therapy

## 2017-07-03 ENCOUNTER — Other Ambulatory Visit: Payer: Self-pay

## 2017-07-03 ENCOUNTER — Other Ambulatory Visit: Payer: Self-pay | Admitting: Pharmacist

## 2017-07-03 DIAGNOSIS — C50412 Malignant neoplasm of upper-outer quadrant of left female breast: Secondary | ICD-10-CM

## 2017-07-03 DIAGNOSIS — M5440 Lumbago with sciatica, unspecified side: Secondary | ICD-10-CM | POA: Insufficient documentation

## 2017-07-03 DIAGNOSIS — Z170 Estrogen receptor positive status [ER+]: Principal | ICD-10-CM

## 2017-07-03 DIAGNOSIS — M25612 Stiffness of left shoulder, not elsewhere classified: Secondary | ICD-10-CM | POA: Diagnosis present

## 2017-07-03 DIAGNOSIS — R293 Abnormal posture: Secondary | ICD-10-CM | POA: Insufficient documentation

## 2017-07-03 DIAGNOSIS — M25512 Pain in left shoulder: Secondary | ICD-10-CM | POA: Diagnosis present

## 2017-07-03 DIAGNOSIS — I890 Lymphedema, not elsewhere classified: Secondary | ICD-10-CM | POA: Insufficient documentation

## 2017-07-03 DIAGNOSIS — G8929 Other chronic pain: Secondary | ICD-10-CM | POA: Insufficient documentation

## 2017-07-03 MED ORDER — OXYCODONE HCL 15 MG PO TABS: 15.0000 mg | ORAL_TABLET | ORAL | 0 refills | Status: DC | PRN

## 2017-07-03 MED ORDER — ABEMACICLIB 100 MG PO TABS: 100.0000 mg | ORAL_TABLET | Freq: Two times a day (BID) | ORAL | 3 refills | Status: DC

## 2017-07-03 MED FILL — VERZENIO 100 MG TAB: 100 | 28 days supply | Qty: 56 | Fill #0

## 2017-07-03 NOTE — Patient Instructions (Signed)

## 2017-07-03 NOTE — Telephone Encounter (Signed)
New script for oxycodone IR placed in triage for pick up.  Pt is aware.

## 2017-07-03 NOTE — Therapy (Signed)
[place], [place], [address] Phone: [phone]   Fax:  [phone]  Physical Therapy Treatment  Patient Details  Name: Alison [name] MRN: [id number] Date of Birth: 03/27/1961 Referring Provider: Dr. [name]   Encounter Date: 07/03/2017  PT End of Session - 07/03/17 1211    Visit Number  44    Number of Visits  55    Date for PT Re-Evaluation  08/26/17    PT Start Time  1100    PT Stop Time  1145    PT Time Calculation (min)  45 min    Activity Tolerance  Patient tolerated treatment well    Behavior During Therapy  [facility] for tasks assessed/performed       Past Medical History:  Diagnosis Date  . Anxiety   . Asthma     seasonal asthma  . Breast cancer ([place])    metastatic breast cancer  . Complication of anesthesia 2007   aspiration pneumonia post hysterectomy.  Blood pressure would drop low- but no problems 01/2016- surgery  . History of blood transfusion   . History of mitral valve prolapse 1988   with palpitations  . History of radiation therapy 04/02/16- 05/21/16   Left Chest Wall, SCV, Axilla, IM nodes  . Hypertension    no meds  . Neuropathy    with chemo  . Osteopenia determined by x-ray   . Pneumonia    1980's and 1990's  . Raynaud's disease   . 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 Skin cancer 03/12/2014   basal- back    Past Surgical History:  Procedure Laterality Date  . ABDOMINAL HYSTERECTOMY  2007   with BSO  . APPENDECTOMY  2007  . BREAST SURGERY Left 02/02/16   mastectomy with reconstruction  . c-section  1991  . COLON RESECTION SIGMOID  12/02/2012   [facility]   . HERNIA REPAIR  2007  . LAPAROSCOPIC ENDOMETRIOSIS FULGURATION  1984, 1989  . Port- a- cath placement  07/2015    There were no vitals filed for this visit.  Subjective Assessment - 07/03/17 1114    Subjective  Pt states she has been walking at home on the treadmill.  She  notices that she has trouble coming down the steps first thing in the morning     Pertinent History  Breast cancer diagnosed November2016 She had chemotherapy. but did have some neuropathy so it had to be stopped. On June 8, she had a mastectomy with immedicate expander placement and with first set of lymphnodes removed, On June 20 she went for complete axillary lymph node dissection. She says she has had always been "full necked" but has neck and upper body fullness since the cancer was diagnosed. She reports she has osteopenia in low back and has had a fracture that was discovered in 2014 when she had a Dexa scan. Pt has had metastasis to the bones of the spine and recent  bilateral breast surgery for a reduction on the right and expander removal on the left with implant placement and removal     Patient Stated Goals  to decrease the pain     Currently in Pain?  No/denies                      [facility] Adult PT Treatment/Exercise - 07/03/17 0001      Ambulation/Gait   Stairs  Yes    Stairs Assistance  6: Modified independent (Device/Increase time)    Stair Management Technique  One rail Right;Sideways;Backwards    Number of Stairs  6    Height of Stairs  -- 4 and 6    4 and 6    Gait Comments  Reviewed several options for going up and down steps sideways and backwards with cues to keep back straight with no rotation. Pt was able to feel more confident with this technique       Lumbar Exercises: Supine   Bridge  10 reps    Bridge Limitations  pt was able to lift hips off bed.  also able to one legged bridge 2 sets of 3     Other Supine Lumbar Exercises  yellow theraband around both knees and pull apart about 10 reps x2       Lumbar Exercises: Sidelying   Clam  10 reps    Hip Abduction  10 reps    Other Sidelying Lumbar Exercises  knee to chest       Knee/Hip Exercises: Standing   Lateral Step Up  Right;Left;20 reps    Lateral Step Up Limitations  holding onto dowel and onto  table top .  also lateral step up, then step down to    also lateral step up, then step down to    Other Standing Knee Exercises  also lateral step up, then step down to outside of step with each leg , then back up to top       Shoulder Exercises: Supine   Other Supine Exercises  meeks decompression                 Short Term Clinic Goals - 06/25/17 1750      CC Short Term Goal  #1   Title  Pt will report she knows strategies for good body mechanics to decrease low back pain     Time  8    Period  Weeks    Status  On-going      CC Short Term Goal  #2   Title  Pt will have a deacrease in circumference 15 cm prox to ulnar styloid of left forearm by 1 cm     Baseline  25 cm , on 05/16/2017 it is 23.5    Status  Achieved      CC Short Term Goal  #3   Title  Pt will report she knows how to manage left UE lymphedema with use of compression, elevation and exercise, self manual lymph drainage and possibly Flexitouch     Baseline  Pt has day and nighttime garments and Flexitouch     Status  Achieved          Long Term Clinic Goals - 06/25/17 1751      CC Long Term Goal  #1   Title  Patient will report  that she can manage back pain  so she can perform daily activities with greater ease    Status  On-going      CC Long Term Goal  #2   Title  Pt will  have given the TENS unit a trial to see if it will help with back pain     Baseline  has not needed to use TENS     Time  8    Period  Weeks    Status  On-going      CC Long Term Goal  #3   Title  Pt will be independent in  core and  LE strengthening exercises     Time  8    Period  Weeks    Status  On-going      CC Long Term Goal  #4   Title  Pt will report low back pain is decreased to a 4/10 so that she can perform activities of daily living easier     Status  Achieved         Plan - 07/03/17 1212    Clinical Impression Statement  pt reports she is walking on the treadmill at home so focused on hip strengthning  today.  Also reviewed strategies for going to down stairs to increase her confidence in the mornings     Rehab Potential  Fair    Clinical Impairments Affecting Rehab Potential  chemotherapy with neuropathy , radiation to back     PT Frequency  2x / week    PT Duration  8 weeks    PT Treatment/Interventions  ADLs/Self Care Home Management;Manual lymph drainage;Compression bandaging;Scar mobilization;Passive range of motion;Patient/family education;Taping;Manual techniques;Therapeutic exercise;Therapeutic activities;Orthotic Fit/Training;DME Instruction;Electrical Stimulation;Moist Heat    PT Next Visit Plan   continue with dynamic gait and LE strengthening exercise. continue with BOSU or airex. add walking lunges  Monitor lymphedema, continue standing isometrics and progress core  general exercise for strengthening and pain managment with TENS as needed. MLD as needed for pain managment of left axillary discomfort     Consulted and Agree with Plan of Care  Patient       Patient will benefit from skilled therapeutic intervention in order to improve the following deficits and impairments:  Decreased range of motion, Impaired UE functional use, Decreased activity tolerance, Decreased knowledge of precautions, Decreased skin integrity, Impaired perceived functional ability, Pain, Decreased knowledge of use of DME, Increased edema, Postural dysfunction, Decreased strength, Impaired sensation, Decreased scar mobility, Decreased mobility, Increased muscle spasms  Visit Diagnosis: Abnormal posture  Low back pain with sciatica, sciatica laterality unspecified, unspecified back pain laterality, unspecified chronicity  Lymphedema, not elsewhere classified  Chronic left shoulder pain  Stiffness of left shoulder joint     Problem List Patient Active Problem List   Diagnosis Date Noted  . Counseling regarding goals of care 04/16/2017  . Bone metastases ([place]) 02/28/2017  . Uncontrolled pain  02/28/2017  . Osteopenia determined by x-ray 09/20/2016  . Primary malignant neoplasm of left breast with stage 2 nodal metastasis per American Joint Committee on Cancer 7th edition (N2) ([place]) 02/14/2016  . Chemotherapy-induced neuropathy ([place]) 12/30/2015  . Insomnia 09/01/2015  . Malignant neoplasm of upper-outer quadrant of left breast in female, estrogen receptor positive ([place]) 08/08/2015   Donato Heinz. Owens Shark PT  Norwood Levo 07/03/2017, 12:15 PM  [place], [place], [address] Phone: [phone]   Fax:  [phone]  Name: JENNETTE LEASK MRN: [id number] Date of Birth: 1961-05-30

## 2017-07-05 ENCOUNTER — Ambulatory Visit: Payer: 59 | Admitting: Physical Therapy

## 2017-07-05 ENCOUNTER — Encounter: Payer: Self-pay | Admitting: Physical Therapy

## 2017-07-05 DIAGNOSIS — G8929 Other chronic pain: Secondary | ICD-10-CM

## 2017-07-05 DIAGNOSIS — I890 Lymphedema, not elsewhere classified: Secondary | ICD-10-CM

## 2017-07-05 DIAGNOSIS — R293 Abnormal posture: Secondary | ICD-10-CM

## 2017-07-05 DIAGNOSIS — M25612 Stiffness of left shoulder, not elsewhere classified: Secondary | ICD-10-CM

## 2017-07-05 DIAGNOSIS — M5440 Lumbago with sciatica, unspecified side: Secondary | ICD-10-CM

## 2017-07-05 DIAGNOSIS — M25512 Pain in left shoulder: Secondary | ICD-10-CM

## 2017-07-05 NOTE — Therapy (Signed)
[place], [place], [address] Phone: [phone]   Fax:  [phone]  Physical Therapy Treatment  Patient Details  Name: ALEXX GIAMBRA MRN: [id number] Date of Birth: February 06, 1961 Referring Provider: Dr. [name]   Encounter Date: 07/05/2017  PT End of Session - 07/05/17 1249    Visit Number  45    Number of Visits  55    Date for PT Re-Evaluation  08/26/17    PT Start Time  0930    PT Stop Time  1015    PT Time Calculation (min)  45 min    Activity Tolerance  Patient tolerated treatment well    Behavior During Therapy  [facility] for tasks assessed/performed       Past Medical History:  Diagnosis Date  . Anxiety   . Asthma     seasonal asthma  . Breast cancer ([place])    metastatic breast cancer  . Complication of anesthesia 2007   aspiration pneumonia post hysterectomy.  Blood pressure would drop low- but no problems 01/2016- surgery  . History of blood transfusion   . History of mitral valve prolapse 1988   with palpitations  . History of radiation therapy 04/02/16- 05/21/16   Left Chest Wall, SCV, Axilla, IM nodes  . Hypertension    no meds  . Neuropathy    with chemo  . Osteopenia determined by x-ray   . Pneumonia    1980's and 1990's  . Raynaud's disease   . 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 Skin cancer 03/12/2014   basal- back    Past Surgical History:  Procedure Laterality Date  . ABDOMINAL HYSTERECTOMY  2007   with BSO  . APPENDECTOMY  2007  . BREAST SURGERY Left 02/02/16   mastectomy with reconstruction  . c-section  1991  . COLON RESECTION SIGMOID  12/02/2012   [facility]   . HERNIA REPAIR  2007  . LAPAROSCOPIC ENDOMETRIOSIS FULGURATION  1984, 1989  . Port- a- cath placement  07/2015    There were no vitals filed for this visit.  Subjective Assessment - 07/05/17 0947    Subjective  Yesterday I did 18 minutes on the treadmill. Its not hard or  doesn't hurt anything     Pertinent History  Breast cancer diagnosed November2016 She had chemotherapy. but did have some neuropathy so it had to be stopped. On June 8, she had a mastectomy with immedicate expander placement and with first set of lymphnodes removed, On June 20 she went for complete axillary lymph node dissection. She says she has had always been "full necked" but has neck and upper body fullness since the cancer was diagnosed. She reports she has osteopenia in low back and has had a fracture that was discovered in 2014 when she had a Dexa scan. Pt has had metastasis to the bones of the spine and recent  bilateral breast surgery for a reduction on the right and expander removal on the left with implant placement and removal     Patient Stated Goals  to decrease the pain     Currently in Pain?  Yes    Pain Score  3     Pain Location  Back    Pain Orientation  Mid    Pain Descriptors / Indicators  Aching                      [facility] Adult PT Treatment/Exercise -  07/05/17 0001      Lumbar Exercises: Aerobic   Tread Mill  0% grad 1.5 mph for 12 nutes  last minute was at 1.7       Lumbar Exercises: Standing   Forward Lunge  10 reps;Other (comment) walking lunges     Other Standing Lumbar Exercises  standing in corner of room with eyes closed, bicep curls, arm raises, and quickly reaching forward with right arm with no loss of balance       Lumbar Exercises: Seated   Other Seated Lumbar Exercises  marching and opposite arm elevation with march for core activateion       Knee/Hip Exercises: Stretches   Active Hamstring Stretch  Both;2 reps;30 seconds      Knee/Hip Exercises: Standing   Lateral Step Up  Right;Left;20 reps over 3 inch step so not as wide                 Short Term Clinic Goals - 06/25/17 1750      CC Short Term Goal  #1   Title  Pt will report she knows strategies for good body mechanics to decrease low back pain     Time  8    Period   Weeks    Status  On-going      CC Short Term Goal  #2   Title  Pt will have a deacrease in circumference 15 cm prox to ulnar styloid of left forearm by 1 cm     Baseline  25 cm , on 05/16/2017 it is 23.5    Status  Achieved      CC Short Term Goal  #3   Title  Pt will report she knows how to manage left UE lymphedema with use of compression, elevation and exercise, self manual lymph drainage and possibly Flexitouch     Baseline  Pt has day and nighttime garments and Flexitouch     Status  Achieved          Long Term Clinic Goals - 06/25/17 1751      CC Long Term Goal  #1   Title  Patient will report  that she can manage back pain  so she can perform daily activities with greater ease    Status  On-going      CC Long Term Goal  #2   Title  Pt will  have given the TENS unit a trial to see if it will help with back pain     Baseline  has not needed to use TENS     Time  8    Period  Weeks    Status  On-going      CC Long Term Goal  #3   Title  Pt will be independent in core and  LE strengthening exercises     Time  8    Period  Weeks    Status  On-going      CC Long Term Goal  #4   Title  Pt will report low back pain is decreased to a 4/10 so that she can perform activities of daily living easier     Status  Achieved         Plan - 07/05/17 1250    Clinical Impression Statement  Pt did very well with exercise today except she got nauseous at end of session.  question if this is from vestibular activation during balance activities with eyes closed.  Pt felt better at end  of session     Clinical Impairments Affecting Rehab Potential  chemotherapy with neuropathy , radiation to back     PT Treatment/Interventions  ADLs/Self Care Home Management;Manual lymph drainage;Compression bandaging;Scar mobilization;Passive range of motion;Patient/family education;Taping;Manual techniques;Therapeutic exercise;Therapeutic activities;Orthotic Fit/Training;DME Instruction;Electrical  Stimulation;Moist Heat    PT Next Visit Plan   continue with dynamic gait and LE strengthening exercise. continue with BOSU or airex. add walking lunges  Monitor lymphedema, continue standing isometrics and progress core  general exercise for strengthening and pain managment with TENS as needed. MLD as needed for pain managment of left axillary discomfort        Patient will benefit from skilled therapeutic intervention in order to improve the following deficits and impairments:  Decreased range of motion, Impaired UE functional use, Decreased activity tolerance, Decreased knowledge of precautions, Decreased skin integrity, Impaired perceived functional ability, Pain, Decreased knowledge of use of DME, Increased edema, Postural dysfunction, Decreased strength, Impaired sensation, Decreased scar mobility, Decreased mobility, Increased muscle spasms  Visit Diagnosis: Abnormal posture  Low back pain with sciatica, sciatica laterality unspecified, unspecified back pain laterality, unspecified chronicity  Lymphedema, not elsewhere classified  Chronic left shoulder pain  Stiffness of left shoulder joint     Problem List Patient Active Problem List   Diagnosis Date Noted  . Counseling regarding goals of care 04/16/2017  . Bone metastases ([place]) 02/28/2017  . Uncontrolled pain 02/28/2017  . Osteopenia determined by x-ray 09/20/2016  . Primary malignant neoplasm of left breast with stage 2 nodal metastasis per American Joint Committee on Cancer 7th edition (N2) ([place]) 02/14/2016  . Chemotherapy-induced neuropathy ([place]) 12/30/2015  . Insomnia 09/01/2015  . Malignant neoplasm of upper-outer quadrant of left breast in female, estrogen receptor positive ([place]) 08/08/2015   Donato Heinz. Owens Shark PT  Norwood Levo 07/05/2017, 12:56 PM  [place], [place], [address] Phone: [phone]   Fax:  [phone]  Name:  KIMBLEY SPRAGUE MRN: [id number] Date of Birth: 12-04-1960

## 2017-07-10 ENCOUNTER — Other Ambulatory Visit: Payer: 59

## 2017-07-10 ENCOUNTER — Ambulatory Visit: Payer: 59 | Admitting: Physical Therapy

## 2017-07-12 ENCOUNTER — Encounter: Payer: Self-pay | Admitting: Physical Therapy

## 2017-07-12 ENCOUNTER — Ambulatory Visit: Payer: 59 | Admitting: Physical Therapy

## 2017-07-12 DIAGNOSIS — R293 Abnormal posture: Secondary | ICD-10-CM

## 2017-07-12 DIAGNOSIS — M25512 Pain in left shoulder: Secondary | ICD-10-CM

## 2017-07-12 DIAGNOSIS — G8929 Other chronic pain: Secondary | ICD-10-CM

## 2017-07-12 DIAGNOSIS — M5440 Lumbago with sciatica, unspecified side: Secondary | ICD-10-CM

## 2017-07-12 DIAGNOSIS — I890 Lymphedema, not elsewhere classified: Secondary | ICD-10-CM

## 2017-07-12 DIAGNOSIS — M25612 Stiffness of left shoulder, not elsewhere classified: Secondary | ICD-10-CM

## 2017-07-12 NOTE — Therapy (Signed)
[place], [place], [address] Phone: [phone]   Fax:  [phone]  Physical Therapy Treatment  Patient Details  Name: Alison [name] MRN: [id number] Date of Birth: 02-24-1961 Referring Provider: Dr. [name]   Encounter Date: 07/12/2017    Past Medical History:  Diagnosis Date  . Anxiety   . Asthma     seasonal asthma  . Breast cancer ([place])    metastatic breast cancer  . Complication of anesthesia 2007   aspiration pneumonia post hysterectomy.  Blood pressure would drop low- but no problems 01/2016- surgery  . History of blood transfusion   . History of mitral valve prolapse 1988   with palpitations  . History of radiation therapy 04/02/16- 05/21/16   Left Chest Wall, SCV, Axilla, IM nodes  . Hypertension    no meds  . Neuropathy    with chemo  . Osteopenia determined by x-ray   . Pneumonia    1980's and 1990's  . Raynaud's disease   . 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 Skin cancer 03/12/2014   basal- back    Past Surgical History:  Procedure Laterality Date  . ABDOMINAL HYSTERECTOMY  2007   with BSO  . APPENDECTOMY  2007  . BREAST SURGERY Left 02/02/16   mastectomy with reconstruction  . c-section  1991  . COLON RESECTION SIGMOID  12/02/2012   [facility]   . HERNIA REPAIR  2007  . IMMEDIATE LEFT BREAST RECONSTRUCTION WITH PLACEMENT OF TISSUE EXPANDER AND FLEX HD (ACELLULAR HYDRATED DERMIS) Left 02/02/2016   Performed by [name], DO at [facility]  . LAPAROSCOPIC ENDOMETRIOSIS FULGURATION  1984, 1989  . LEFT AXILLARY LYMPH NODE DISSECTION Left 02/14/2016   Performed by [name], MD at [place]  . LEFT BREAST MASTECTOMY AND RADIOACTIVE SEED GUIDED LEFT SENTINEL LYMPH NODE BIOPSY Left 02/02/2016   Performed by [name], MD at [facility]  . MASTOPEXY Right 03/28/2017   Performed by [name], DO at  [facility]  . Port- a- cath placement  07/2015  . REMOVAL OF TISSUE EXPANDER AND PLACEMENT OF IMPLANT Left 03/28/2017   Performed by [name], DO at [facility]  . REMOVAL PORT-A-CATH Right 02/02/2016   Performed by [name], MD at [facility]  . RIGHT MAMMARY REDUCTION  (BREAST) Right 03/28/2017   Performed by [name], DO at [facility]    There were no vitals filed for this visit.  Subjective Assessment - 07/12/17 1108    Subjective  Pt says she wasn't feeling well ,but feels better today     Pertinent History  Breast cancer diagnosed November2016 She had chemotherapy. but did have some neuropathy so it had to be stopped. On June 8, she had a mastectomy with immedicate expander placement and with first set of lymphnodes removed, On June 20 she went for complete axillary lymph node dissection. She says she has had always been "full necked" but has neck and upper body fullness since the cancer was diagnosed. She reports she has osteopenia in low back and has had a fracture that was discovered in 2014 when she had a Dexa scan. Pt has had metastasis to the bones of the spine and recent  bilateral breast surgery for a reduction on the right and expander removal on the left with implant placement and removal  Patient Stated Goals  to decrease the pain     Currently in Pain?  -- pt feels dizzy today                       [facility] Adult PT Treatment/Exercise - 07/12/17 0001      Exercises   Exercises  Elbow      Elbow Exercises   Elbow Flexion  AROM;Right;Left    Bar Weights/Barbell (Elbow Flexion)  1 lb      Knee/Hip Exercises: Standing   Lateral Step Up  Right;Left;10 reps;Step Height: 6"    Forward Step Up  Right;Left;10 reps    Forward Step Up Limitations  2# holding at chest     Other Standing Knee Exercises  manual isometrics in standing position       Shoulder Exercises: Standing   Row   Strengthening;Right;Left;5 reps    Theraband Level (Shoulder Row)  Level 1 (Yellow)                Short Term Clinic Goals - 06/25/17 1750      CC Short Term Goal  #1   Title  Pt will report she knows strategies for good body mechanics to decrease low back pain     Time  8    Period  Weeks    Status  On-going      CC Short Term Goal  #2   Title  Pt will have a deacrease in circumference 15 cm prox to ulnar styloid of left forearm by 1 cm     Baseline  25 cm , on 05/16/2017 it is 23.5    Status  Achieved      CC Short Term Goal  #3   Title  Pt will report she knows how to manage left UE lymphedema with use of compression, elevation and exercise, self manual lymph drainage and possibly Flexitouch     Baseline  Pt has day and nighttime garments and Flexitouch     Status  Achieved          Long Term Clinic Goals - 06/25/17 1751      CC Long Term Goal  #1   Title  Patient will report  that she can manage back pain  so she can perform daily activities with greater ease    Status  On-going      CC Long Term Goal  #2   Title  Pt will  have given the TENS unit a trial to see if it will help with back pain     Baseline  has not needed to use TENS     Time  8    Period  Weeks    Status  On-going      CC Long Term Goal  #3   Title  Pt will be independent in core and  LE strengthening exercises     Time  8    Period  Weeks    Status  On-going      CC Long Term Goal  #4   Title  Pt will report low back pain is decreased to a 4/10 so that she can perform activities of daily living easier     Status  Achieved         Plan - 07/12/17 1149    Clinical Impression Statement  Pt not feeling well today.  She thinks it may have to do with he medicine.  she takes it for  15 day on and 15 days off and just finished taking her dosage.  she is drinking water and trying to stay hydrated     Clinical Impairments Affecting Rehab Potential  chemotherapy with neuropathy , radiation to  back     PT Next Visit Plan   continue with dynamic gait and LE strengthening exercise. continue with BOSU or airex. add walking lunges  Monitor lymphedema, continue standing isometrics and progress core  general exercise for strengthening and pain managment with TENS as needed. MLD as needed for pain managment of left axillary discomfort        Patient will benefit from skilled therapeutic intervention in order to improve the following deficits and impairments:  Decreased range of motion, Impaired UE functional use, Decreased activity tolerance, Decreased knowledge of precautions, Decreased skin integrity, Impaired perceived functional ability, Pain, Decreased knowledge of use of DME, Increased edema, Postural dysfunction, Decreased strength, Impaired sensation, Decreased scar mobility, Decreased mobility, Increased muscle spasms  Visit Diagnosis: Abnormal posture  Low back pain with sciatica, sciatica laterality unspecified, unspecified back pain laterality, unspecified chronicity  Lymphedema, not elsewhere classified  Chronic left shoulder pain  Stiffness of left shoulder joint     Problem List Patient Active Problem List   Diagnosis Date Noted  . Counseling regarding goals of care 04/16/2017  . Bone metastases ([place]) 02/28/2017  . Uncontrolled pain 02/28/2017  . Osteopenia determined by x-ray 09/20/2016  . Primary malignant neoplasm of left breast with stage 2 nodal metastasis per American Joint Committee on Cancer 7th edition (N2) ([place]) 02/14/2016  . Chemotherapy-induced neuropathy ([place]) 12/30/2015  . Insomnia 09/01/2015  . Malignant neoplasm of upper-outer quadrant of left breast in female, estrogen receptor positive ([place]) 08/08/2015   Donato Heinz. Owens Shark PT  Norwood Levo 07/12/2017, 11:51 AM  [place], [place], [address] Phone: [phone]   Fax:  [phone]  Name: Alison [name] MRN: [id number] Date of Birth: [birth year]

## 2017-07-15 NOTE — Progress Notes (Signed)
[place]  [phone] [phone]   ID: Kearney Hard DOB: 1961-04-03  MR#: [id number]  PVX#:[id number]  Patient Care Team: [name], MD as PCP - General (Internal Medicine) Erroll Luna, MD as Consulting Physician (General Surgery) Magrinat, Virgie Dad, MD as Consulting Physician (Oncology) Aloha Gell, MD as Consulting Physician (Obstetrics and Gynecology) Harriett Sine, MD as Consulting Physician (Dermatology) Benson Norway, RN as Registered Nurse (Oncology) Eppie Gibson, MD as Attending Physician (Radiation Oncology) PCP: [name], MD OTHER MD:  CHIEF COMPLAINT: Estrogen receptor positive breast cancer  CURRENT TREATMENT: Anastrozole, fulvestrant, abemaciclib, denosumab/Xgeva  INTERVAL HISTORY: [place] returns today for follow-up and treatment of her 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 She is taking Anastrozole daily and continues to tolerate it well.  She continues on Fulvestrant and Xgeva every 4 weeks and tolerates them well.  She says that her left lower jaw is bothering her.  She says she needs to see the dentist about a tooth that may need to be pulled.  She is taking the Abemaciclib.  She has had some issues with neutropenia and now takes it two weeks on and two weeks off.  She just finished two weeks on 11/15 and is due to restart the Abemaciclib on 12/1.Marland Kitchen      REVIEW OF SYSTEMS: Hilary takes Oxycodone and Oxycontin for her lower back from the cancer.  She says her pain is very well controlled on the current medications.  She is sleeping well at night taking Ambien.  She is concerned about a tumor being related to the swelling in her arm and her left upper abdomen.  She says this swelling is bothering her.  She underwent second opinion from [facility] based on her current data (she didn't go to [place]), and they suggested it may be a tumor (she can't quite remember the terminology).  She denies any  other issues today and a detailed ROS is otherwise non contributory.    BREAST CANCER HISTORY: From the original intake note:  Tristine had routine screening mammography with tomography at [facility] February 20 11/14/2014 showing a possible mass in the right breast. Diagnostic right mammography with right breast ultrasonography 11/01/2014 found the breast density to be category C. In the upper outer quadrant of the right breast there was a partially obscured mass which on physical exam was palpable as an area of thickening at the 10:00 position 8 cm from the nipple. Ultrasound showed multiple cysts in the upper outer right breast corresponding to the mass in question.  In November 2016 the patient felt a change in the upper left breast and brought it to her primary care physician's attention. Diagnostic left mammography with tomosynthesis and left breast ultrasonography at the [facility] December 01/14/2015 found trabecular thickening throughout the left breast with skin thickening, but no circumscribed mass or suspicious calcifications. A rounded mass in the left axilla measured 6 mm. There was an additional mildly prominent lymph node in the left axilla which was what the patient had been palpating. Ultrasonography showed ill-defined swelling throughout the inner left breast with overlying skin thickening.. At the 2:00 position 4 cm from the nipple there was an irregular hypoechoic mass measuring 2.3 cm. A second mass at the 1:00 area 3 cm from the nipple measured 1 cm. The distance between these 2 masses was 1.8 cm. There was also a round hypoechoic left axillary mass measuring 0.6 cm. Overall the was an area of 3.7 cm  of mixed echogenicity corresponding to the area of palpable soft tissue swelling.   On 08/04/2015 the patient underwent biopsy of the mass at the 2:00 axis 4 cm from the nipple and a second biopsy was performed of the hypoechoic mass at the 1:00 position 3 cm from the nipple. Biopsy  of the suspicious nodule in the lower left axilla was attempted but could not be completed as the mass became obscured after administration of lidocaine.  The pathology from both left breast biopsies 08/04/2015 ([id number]) showed invasive ductal carcinoma, grade 3. The prognostic profile obtained from the larger tumor showed estrogen receptor to be strongly positive at 95%, progesterone receptor positive at 30%, with an MIB-1 of 12%, and no HER-2 amplification, the signals ratio being 1.22 and the number per cell 2.20.  On 08/09/2015 the patient underwent biopsy of this suspicious left axillary lymph node noted above, and this showed ([id number]) metastatic carcinoma with extracapsular extension.  The patient's subsequent history is as detailed below    PAST MEDICAL HISTORY: Past Medical History:  Diagnosis Date  . Anxiety   . Asthma     seasonal asthma  . Breast cancer ([place])    metastatic breast cancer  . Complication of anesthesia 2007   aspiration pneumonia post hysterectomy.  Blood pressure would drop low- but no problems 01/2016- surgery  . History of blood transfusion   . History of mitral valve prolapse 1988   with palpitations  . History of radiation therapy 04/02/16- 05/21/16   Left Chest Wall, SCV, Axilla, IM nodes  . Hypertension    no meds  . Neuropathy    with chemo  . Osteopenia determined by x-ray   . Pneumonia    1980's and 1990's  . Raynaud's disease   . 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 Skin cancer 03/12/2014   basal- back    PAST SURGICAL HISTORY: Past Surgical History:  Procedure Laterality Date  . ABDOMINAL HYSTERECTOMY  2007   with BSO  . APPENDECTOMY  2007  . AXILLARY LYMPH NODE DISSECTION Left 02/14/2016   Procedure: LEFT AXILLARY LYMPH NODE DISSECTION;  Surgeon: [name], MD;  Location: [place];  Service: General;  Laterality: Left;  . BREAST RECONSTRUCTION WITH PLACEMENT OF TISSUE EXPANDER AND FLEX HD  (ACELLULAR HYDRATED DERMIS) Left 02/02/2016   Procedure: IMMEDIATE LEFT BREAST RECONSTRUCTION WITH PLACEMENT OF TISSUE EXPANDER AND FLEX HD (ACELLULAR HYDRATED DERMIS);  Surgeon: [name], DO;  Location: [place];  Service: Plastics;  Laterality: Left;  . BREAST REDUCTION SURGERY Right 03/28/2017   Procedure: RIGHT MAMMARY REDUCTION  (BREAST);  Surgeon: [name], DO;  Location: [place];  Service: Plastics;  Laterality: Right;  . BREAST SURGERY Left 02/02/16   mastectomy with reconstruction  . c-section  1991  . COLON RESECTION SIGMOID  12/02/2012   [facility]   . HERNIA REPAIR  2007  . LAPAROSCOPIC ENDOMETRIOSIS FULGURATION  1984, 1989  . MASTOPEXY Right 03/28/2017   Procedure: MASTOPEXY;  Surgeon: [name], DO;  Location: [place];  Service: Plastics;  Laterality: Right;  . Port- a- cath placement  07/2015  . PORT-A-CATH REMOVAL Right 02/02/2016   Procedure: REMOVAL PORT-A-CATH;  Surgeon: [name], MD;  Location: [place];  Service: General;  Laterality: Right;  . RADIOACTIVE SEED GUIDED AXILLARY SENTINEL LYMPH NODE Left 02/02/2016   Procedure: LEFT BREAST MASTECTOMY AND RADIOACTIVE SEED GUIDED LEFT SENTINEL LYMPH NODE  BIOPSY ;  Surgeon: [name], MD;  Location: [place];  Service: General;  Laterality: Left;  . REMOVAL OF TISSUE EXPANDER AND PLACEMENT OF IMPLANT Left 03/28/2017   Procedure: REMOVAL OF TISSUE EXPANDER AND PLACEMENT OF IMPLANT;  Surgeon: [name], DO;  Location: [place];  Service: Plastics;  Laterality: Left;    FAMILY HISTORY Family History  Problem Relation Age of Onset  . Lung cancer Father    the patient's father died from lung cancer at the age of 33 in the setting of tobacco abuse. The patient's mother died at the age of 65 with pancreatic cancer. The patient had no siblings. There is no history of breast or ovarian cancer in the  family.  GYNECOLOGIC HISTORY:  No LMP recorded. Patient has had a hysterectomy. Menarche age 44, first live birth age 63. The patient is GX P1. She is status post total hysterectomy with bilateral salpingo-oophorectomy. She has been on hormone replacement since that time and is tapering to off at the time of this dictation.   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 She used to work at [employer]. Her husband [name] works for [employer] division at a [employer] their son [name] is currently at home.    ADVANCED DIRECTIVES: Not in place   HEALTH MAINTENANCE: Social History   Tobacco Use  . Smoking status: Former Smoker    Packs/day: 1.00    Years: 10.00    Pack years: 10.00    Types: Cigarettes    Last attempt to quit: 11/25/2008    Years since quitting: 8.6  . Smokeless tobacco: Never Used  . Tobacco comment: on and off smoker never consistent  Substance Use Topics  . Alcohol use: Yes    Comment: social  . Drug use: No     Colonoscopy: April 2014/ Raynaldo Opitz [facility]  PAP: s/p hysterectomy  Bone density: August 2016/ osteopenia  Lipid panel:  Allergies  Allergen Reactions  . Sulfa Antibiotics Other (See Comments)    Blisters in mouth    Current Outpatient Medications  Medication Sig Dispense Refill  . abemaciclib (VERZENIO) 100 MG tablet Take 1 tablet (100 mg total) 2 (two) times daily by mouth. 56 tablet 3  . anastrozole (ARIMIDEX) 1 MG tablet TAKE ONE TABLET BY MOUTH ONCE DAILY 90 tablet 3  . Ascorbic Acid (VITAMIN C) 1000 MG tablet Take 1,000 mg by mouth 2 (two) times daily.    . cetirizine (ZYRTEC) 10 MG tablet Take 10 mg by mouth daily.    . Cholecalciferol (VITAMIN D3) 2000 UNITS capsule Take 2,000 Units by mouth daily.     Marland Kitchen escitalopram (LEXAPRO) 10 MG tablet Take 1 tablet (10 mg total) by mouth daily. 30 tablet 12  . fluticasone (FLONASE) 50 MCG/ACT nasal spray Place 2 sprays into both nostrils daily. Reported on 11/18/2015    .  gabapentin (NEURONTIN) 300 MG capsule Take 1 capsule (300 mg total) by mouth at bedtime as needed. Take 2 tablets in AM, 2 tablets mid-day, and 4 tablets at bedtime 90 capsule 4  . LORazepam (ATIVAN) 0.5 MG tablet Take 1 tablet (0.5 mg total) by mouth at bedtime. 30 tablet 3  . Multiple Vitamin (MULTIVITAMIN) tablet Take 1 tablet by mouth daily.    . ondansetron (ZOFRAN) 8 MG tablet Take 1 tablet (8 mg total) by mouth every 8 (eight) hours as needed for nausea or vomiting. 20 tablet 0  . oxyCODONE (OXYCONTIN)  80 mg 12 hr tablet Take 1 tablet (80 mg total) by mouth every 8 (eight) hours. 90 tablet 0  . oxyCODONE (ROXICODONE) 15 MG immediate release tablet Take 1-2 tablets (15-30 mg total) every 4 (four) hours as needed by mouth for pain. 90 tablet 0  . Probiotic Product (PROBIOTIC DAILY PO) Take 1 capsule by mouth daily. [place] prochlorperazine (COMPAZINE) 10 MG tablet Take 1 tablet (10 mg total) by mouth every 6 (six) hours as needed (Nausea or vomiting). 30 tablet 2  . traZODone (DESYREL) 50 MG tablet TAKE 3 TABLETS AT BEDTIME 90 tablet 2  . zolpidem (AMBIEN) 5 MG tablet Take 1 tablet (5 mg total) by mouth at bedtime as needed for sleep. for sleep 30 tablet 2  . anastrozole (ARIMIDEX) 1 MG tablet Take 1 mg by mouth daily.     No current facility-administered medications for this visit.     OBJECTIVE:   Vitals:   07/16/17 1149  BP: (!) 169/95  Pulse: 92  Resp: 18  Temp: 99 F (37.2 C)  SpO2: 94%     Body mass index is 27.11 kg/m.    ECOG FS:2 - Symptomatic, <50% confined to bed  GENERAL: Patient is a well appearing female in no acute distress HEENT:  Sclerae anicteric.  Oropharynx clear and moist. No ulcerations or evidence of oropharyngeal candidiasis. Neck is supple.  NODES:  No cervical, supraclavicular, or axillary lymphadenopathy palpated.  BREAST EXAM:  Deferred. LUNGS:  Clear to auscultation bilaterally.  No wheezes or  rhonchi. HEART:  Regular rate and rhythm. No murmur appreciated. ABDOMEN:  Soft, nontender.  Positive, normoactive bowel sounds. No organomegaly palpated. MSK:  No focal spinal tenderness to palpation. Full range of motion bilaterally in the upper extremities. EXTREMITIES:  No peripheral edema.   SKIN:  Clear with no obvious rashes or skin changes. No nail dyscrasia. NEURO:  Nonfocal. Well oriented.  Appropriate affect.    LAB RESULTS:  CMP     Component Value Date/Time   NA 140 06/19/2017 1041   K 4.2 06/19/2017 1041   CL 110 02/15/2016 0330   CO2 25 06/19/2017 1041   GLUCOSE 88 06/19/2017 1041   BUN 6.6 (L) 06/19/2017 1041   CREATININE 0.7 06/19/2017 1041   CALCIUM 8.7 06/19/2017 1041   PROT 6.7 06/19/2017 1041   ALBUMIN 3.5 06/19/2017 1041   AST 116 (H) 06/19/2017 1041   ALT 88 (H) 06/19/2017 1041   ALKPHOS 62 06/19/2017 1041   BILITOT 0.32 06/19/2017 1041   GFRNONAA >60 02/15/2016 0330   GFRAA >60 02/15/2016 0330    INo results found for: SPEP, UPEP  Lab Results  Component Value Date   WBC 1.9 (L) 07/16/2017   NEUTROABS 1.0 (L) 07/16/2017   HGB 12.2 07/16/2017   HCT 35.7 07/16/2017   MCV 99.5 07/16/2017   PLT 166 07/16/2017      Chemistry      Component Value Date/Time   NA 140 06/19/2017 1041   K 4.2 06/19/2017 1041   CL 110 02/15/2016 0330   CO2 25 06/19/2017 1041   BUN 6.6 (L) 06/19/2017 1041   CREATININE 0.7 06/19/2017 1041      Component Value Date/Time   CALCIUM 8.7 06/19/2017 1041   ALKPHOS 62 06/19/2017 1041   AST 116 (H) 06/19/2017 1041   ALT 88 (H) 06/19/2017 1041   BILITOT 0.32 06/19/2017 1041       No results found  for: LABCA2  No components found for: LABCA125  No results for input(s): INR in the last 168 hours.  Urinalysis    Component Value Date/Time   COLORURINE YELLOW 11/12/2015 [place] 11/12/2015 0837   LABSPEC 1.005 03/18/2017 1223   PHURINE 6.0 03/18/2017 1223   PHURINE 6.5 11/12/2015 0837    GLUCOSEU Negative 03/18/2017 1223   HGBUR Large 03/18/2017 1223   HGBUR MODERATE (A) 11/12/2015 0837   BILIRUBINUR Negative 03/18/2017 1223   KETONESUR Negative 03/18/2017 [place] 11/12/2015 0837   PROTEINUR Negative 03/18/2017 1223   PROTEINUR NEGATIVE 11/12/2015 0837   UROBILINOGEN 0.2 03/18/2017 1223   NITRITE Negative 03/18/2017 1223   NITRITE NEGATIVE 11/12/2015 0837   LEUKOCYTESUR Moderate 03/18/2017 1223    STUDIES: No results found.   PROTOCOLS:  participated in [place], ABC and PALLAS studies, currently off study   ASSESSMENT: 56 y.o. [place] woman status post biopsy of 2 separate masses in the left breast upper outer quadrant 08/04/2015, clinically mT2 N1, stage IIb/IIIa invasive ductal carcinoma, grade 3, the larger mass being estrogen and progesterone receptor positive, HER-2 negative, with an MIB-1 of 12%  (a) biopsy of a left axillary lymph node 08/09/2015 was positive, with extracapsular extension  (1) neoadjuvant chemotherapy started 08/25/2015 consisting of doxorubicin and cyclophosphamide in dose dense fashion 4, completed 10/06/2015,  followed by weekly paclitaxel 10 completed 12/23/2015  (a) final 2 planned cycles of weekly paclitaxel omitted because of neuropathy concerns  (2) status post left mastectomy with targeted axillary dissection 02/02/2016 for a ypT2 ypN2a, stage IIIa invasive ductal carcinoma, grade 2, with negative margins, repeat prognostic panel showing estrogen receptor positive, but progesterone receptor and HER-2 negative  (a) completion axillary lymph node dissection 02/14/2016 showed an additional 2 out of 8 lymph nodes to be involved (final count 6 out of 10 lymph nodes positive  (b) expander placement at the time of left mastectomy  (3) adjuvant capecitabine starting concurrently with radiation--discontinued 05/21/2016  (4) adjuvant  radiation started 04/02/2016, completed 05/21/2016  (5) anastrozole started October  2017  (a) bone density August 2016 showed osteopenia  (b) repeat bone density 09/07/2016 shows a T score of -1.7.  (c) the patient is status post total abdominal hysterectomy with bilateral salpingo-oophorectomy.  (6) postoperative pain?: Starting OxyContin 20 mg twice a day with oxycodone as needed for breakthrough pain 03/02/2016  (a) failed transition to tramadol, gabapentin, naproxen   (b) started methadone 5 mg TID as of 12/20/2016-- not tolerated  (c) robaxin added June 2018 to NSAIDS and tylenol  (7) Research:  (a) B-WEL study ([facility] [id number])--enrolled in [place] 1 (received education)  (b) PALLAS AFT-05 study: randomized to treatment, started palbociclib 07/26/2016   (i) dose reduced to 100 mg, then to 75 mg because of cytopenias   (ii) went off study study May 2018 because of persistent cytopenias  (c) taxane study 12/20/2016  (d) aspirin study  (e) went off studies as metastatic disease developed in June 2018  METASTATIC DISEASE 02/15/2017: CT scan of the lumbar spine and 02/27/2017 CT of the thoracic and cervical spine shows multiple bone lesions but no evidence of cord compromise; bone lesions are confirmed on PET scan 02/22/2017 which however shows no evidence of visceral disease  (a) CA-27-29 is noninformative  (8) continuing anastrozole, abemaciclib added 02/22/2017, fulvestrant started 02/22/2017 ,   (9) yearly zolendronate given 09/26/2016, changed to monthly denosumab/Xgeva starting 03/19/2017  (10) radiation 04/01/2017 - 04/23/2017 Site/dose:   The lumbar spine  was treated to 35 Gy in 14 fractions of 2.5 Gy.   Other issues: (a) poorly controlled pain: Currently on OxyContin 80 mg po TID/ oxycodone 15-30 mg Q 4h PRN  (i) bowel prophylaxis in place  (b) left upper lobe changes noted on PET scan 02/22/2017 felt to be secondary to radiation  (c) [facility] review suggested discontinuing aromatase inhibitor (she does uncomfortable with that suggestion), and obtaining  a bone survey, which was not     PLAN: Jheri is doing well today.  She will proceed with the Fulvestrant today, but not the Xgeva.  Due to her new dental issue, we will hold the Xgeva until she gets cleared by the dentist.  She will go and see them and see if she will need any dental work.  She will let us know.  Her labs look good considering she just stopped treatment a few days ago.  She will continue taking Abemaciclib 2 weeks on 2 weeks off in addition to her Anastrozole.  She is tolerating all of her treatments well.    Aiyah will continue Oxycontin and Oxcodone for her pain.  Her pain is controlled with these medications and I refilled them for her today.    Camani will call me with her recommendations from [facility] regarding any testing that may be needed.  She will come back and see [place] on 12/19 for labs and prior to her next injection (since we have been holding the xgeva).  She will also undergo PET scan on 12/21.   She knows to call for any questions or concerns prior to her next appointment with [place].    A total of (30) minutes of face-to-face time was spent with this patient with greater than 50% of that time in counseling and care-coordination.    Wilber Bihari, NP  07/16/17 11:55 AM Medical Oncology and Hematology [facility] [address] [place], [place] [address] Tel. [phone]    Fax. [phone]

## 2017-07-16 ENCOUNTER — Other Ambulatory Visit (HOSPITAL_BASED_OUTPATIENT_CLINIC_OR_DEPARTMENT_OTHER): Payer: 59

## 2017-07-16 ENCOUNTER — Ambulatory Visit
Admission: RE | Admit: 2017-07-16 | Discharge: 2017-07-16 | Disposition: A | Discharge status: Home / Self Care | Payer: 59 | Source: Ambulatory Visit | Attending: Oncology | Admitting: Oncology

## 2017-07-16 ENCOUNTER — Other Ambulatory Visit: Payer: Self-pay | Admitting: Oncology

## 2017-07-16 ENCOUNTER — Ambulatory Visit (HOSPITAL_BASED_OUTPATIENT_CLINIC_OR_DEPARTMENT_OTHER): Payer: 59 | Admitting: Adult Health

## 2017-07-16 ENCOUNTER — Encounter: Payer: Self-pay | Admitting: Adult Health

## 2017-07-16 ENCOUNTER — Encounter: Payer: Self-pay | Admitting: *Deleted

## 2017-07-16 ENCOUNTER — Ambulatory Visit (HOSPITAL_BASED_OUTPATIENT_CLINIC_OR_DEPARTMENT_OTHER): Payer: 59

## 2017-07-16 VITALS — BP 169/95 | HR 92 | Temp 99.0°F | Resp 18 | Ht 62.0 in | Wt 148.2 lb

## 2017-07-16 DIAGNOSIS — C7951 Secondary malignant neoplasm of bone: Secondary | ICD-10-CM

## 2017-07-16 DIAGNOSIS — C773 Secondary and unspecified malignant neoplasm of axilla and upper limb lymph nodes: Secondary | ICD-10-CM

## 2017-07-16 DIAGNOSIS — C50412 Malignant neoplasm of upper-outer quadrant of left female breast: Secondary | ICD-10-CM

## 2017-07-16 DIAGNOSIS — Z006 Encounter for examination for normal comparison and control in clinical research program: Secondary | ICD-10-CM

## 2017-07-16 DIAGNOSIS — N6459 Other signs and symptoms in breast: Secondary | ICD-10-CM

## 2017-07-16 DIAGNOSIS — Z5112 Encounter for antineoplastic immunotherapy: Secondary | ICD-10-CM | POA: Diagnosis not present

## 2017-07-16 DIAGNOSIS — N63 Unspecified lump in unspecified breast: Secondary | ICD-10-CM

## 2017-07-16 DIAGNOSIS — C779 Secondary and unspecified malignant neoplasm of lymph node, unspecified: Secondary | ICD-10-CM

## 2017-07-16 DIAGNOSIS — G893 Neoplasm related pain (acute) (chronic): Secondary | ICD-10-CM

## 2017-07-16 DIAGNOSIS — R6884 Jaw pain: Secondary | ICD-10-CM

## 2017-07-16 DIAGNOSIS — Z170 Estrogen receptor positive status [ER+]: Principal | ICD-10-CM

## 2017-07-16 DIAGNOSIS — M8580 Other specified disorders of bone density and structure, unspecified site: Secondary | ICD-10-CM

## 2017-07-16 DIAGNOSIS — C50912 Malignant neoplasm of unspecified site of left female breast: Secondary | ICD-10-CM

## 2017-07-16 LAB — CBC WITH DIFFERENTIAL/PLATELET
BASO%: 3.4 % — AB (ref 0.0–2.0)
BASOS ABS: 0.1 10*3/uL (ref 0.0–0.1)
EOS%: 4.9 % (ref 0.0–7.0)
Eosinophils Absolute: 0.1 10*3/uL (ref 0.0–0.5)
HCT: 35.7 % (ref 34.8–46.6)
HGB: 12.2 g/dL (ref 11.6–15.9)
LYMPH%: 23.6 % (ref 14.0–49.7)
MCH: 33.9 pg (ref 25.1–34.0)
MCHC: 34.1 g/dL (ref 31.5–36.0)
MCV: 99.5 fL (ref 79.5–101.0)
MONO#: 0.3 10*3/uL (ref 0.1–0.9)
MONO%: 15.4 % — AB (ref 0.0–14.0)
NEUT#: 1 10*3/uL — ABNORMAL LOW (ref 1.5–6.5)
NEUT%: 52.7 % (ref 38.4–76.8)
Platelets: 166 10*3/uL (ref 145–400)
RBC: 3.59 10*6/uL — ABNORMAL LOW (ref 3.70–5.45)
RDW: 15.1 % — AB (ref 11.2–14.5)
WBC: 1.9 10*3/uL — ABNORMAL LOW (ref 3.9–10.3)
lymph#: 0.5 10*3/uL — ABNORMAL LOW (ref 0.9–3.3)

## 2017-07-16 MED ORDER — OXYCODONE HCL ER 80 MG PO T12A: 80.0000 mg | EXTENDED_RELEASE_TABLET | Freq: Three times a day (TID) | ORAL | 0 refills | Status: DC

## 2017-07-16 MED ORDER — ONDANSETRON HCL 8 MG PO TABS: 8.0000 mg | ORAL_TABLET | Freq: Three times a day (TID) | ORAL | 5 refills | Status: AC | PRN

## 2017-07-16 MED ORDER — FULVESTRANT 250 MG/5ML IM SOLN
500.0000 mg | Freq: Once | INTRAMUSCULAR | Status: AC
  Administered 2017-07-16: 500 mg via INTRAMUSCULAR
  Filled 2017-07-16: qty 10

## 2017-07-16 MED ORDER — ZOLPIDEM TARTRATE 5 MG PO TABS: 5.0000 mg | ORAL_TABLET | Freq: Every evening | ORAL | 2 refills | Status: DC | PRN

## 2017-07-16 MED ORDER — OXYCODONE HCL 15 MG PO TABS: 15.0000 mg | ORAL_TABLET | ORAL | 0 refills | Status: DC | PRN

## 2017-07-16 NOTE — Progress Notes (Signed)
07/16/2017 Patient in to clinic today for routine visit. Met with patient to let her know that we are conducting study follow-up visits today, which will occur annually for all three treatment/intervention studies she has been enrolled in. Patient is in agreement with continued study follow-up. Information given to Wilber Bihari, NP regarding survival follow-up assessments as required by protocols. Cindy S. Brigitte Pulse BSN, RN, [facility] 07/16/2017 3:37 PM

## 2017-07-17 ENCOUNTER — Telehealth: Payer: Self-pay | Admitting: *Deleted

## 2017-07-17 NOTE — Telephone Encounter (Signed)
"  I saw Alison [name] yesterday. Refill for Zolpidem has not yet been received at [facility].  I'm completely out of this medicine."   Pharmacist denies receipt of phone order.   Phone order given at this time as noted with 07-16-2017 Epic order.  Notified Alison [name] who denies further needs pr questions at this time.

## 2017-07-22 ENCOUNTER — Other Ambulatory Visit: Payer: Self-pay | Admitting: Oncology

## 2017-07-23 ENCOUNTER — Ambulatory Visit: Payer: 59 | Admitting: Physical Therapy

## 2017-07-23 ENCOUNTER — Other Ambulatory Visit: Payer: Self-pay

## 2017-07-23 ENCOUNTER — Telehealth: Payer: Self-pay | Admitting: Oncology

## 2017-07-23 DIAGNOSIS — C50412 Malignant neoplasm of upper-outer quadrant of left female breast: Secondary | ICD-10-CM

## 2017-07-23 DIAGNOSIS — Z170 Estrogen receptor positive status [ER+]: Principal | ICD-10-CM

## 2017-07-23 MED ORDER — OXYCODONE HCL 15 MG PO TABS: 15.0000 mg | ORAL_TABLET | ORAL | 0 refills | Status: DC | PRN

## 2017-07-23 NOTE — Telephone Encounter (Signed)
22/27/2018 successfully faxed office notes and taxane study to [facility] with [facility] @ [phone]

## 2017-07-24 ENCOUNTER — Telehealth: Payer: Self-pay | Admitting: Adult Health

## 2017-07-24 NOTE — Telephone Encounter (Signed)
Spoke with patient re 12/19 appointments. Other appointments remain the same.

## 2017-07-25 ENCOUNTER — Ambulatory Visit: Payer: 59 | Admitting: Physical Therapy

## 2017-07-25 ENCOUNTER — Other Ambulatory Visit: Payer: Self-pay

## 2017-07-25 ENCOUNTER — Encounter: Payer: Self-pay | Admitting: Physical Therapy

## 2017-07-25 DIAGNOSIS — M5440 Lumbago with sciatica, unspecified side: Secondary | ICD-10-CM

## 2017-07-25 DIAGNOSIS — M25612 Stiffness of left shoulder, not elsewhere classified: Secondary | ICD-10-CM

## 2017-07-25 DIAGNOSIS — R293 Abnormal posture: Secondary | ICD-10-CM

## 2017-07-25 DIAGNOSIS — M25512 Pain in left shoulder: Secondary | ICD-10-CM

## 2017-07-25 DIAGNOSIS — I890 Lymphedema, not elsewhere classified: Secondary | ICD-10-CM

## 2017-07-25 DIAGNOSIS — G8929 Other chronic pain: Secondary | ICD-10-CM

## 2017-07-25 NOTE — Therapy (Signed)
[place], [place], [address] Phone: [phone]   Fax:  [phone]  Physical Therapy Treatment  Patient Details  Name: Alison [name] MRN: [id number] Date of Birth: 02/29/1961 Referring Provider: Dr. [name]   Encounter Date: 07/25/2017  PT End of Session - 07/25/17 1620    Visit Number  93    Number of Visits  55    Date for PT Re-Evaluation  08/26/17    PT Start Time  7253    PT Stop Time  1608    PT Time Calculation (min)  51 min    Activity Tolerance  Patient tolerated treatment well    Behavior During Therapy  [facility] for tasks assessed/performed       Past Medical History:  Diagnosis Date  . Anxiety   . Asthma     seasonal asthma  . Breast cancer ([place])    metastatic breast cancer  . Complication of anesthesia 2007   aspiration pneumonia post hysterectomy.  Blood pressure would drop low- but no problems 01/2016- surgery  . History of blood transfusion   . History of mitral valve prolapse 1988   with palpitations  . History of radiation therapy 04/02/16- 05/21/16   Left Chest Wall, SCV, Axilla, IM nodes  . Hypertension    no meds  . Neuropathy    with chemo  . Osteopenia determined by x-ray   . Pneumonia    1980's and 1990's  . Raynaud's disease   . 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 Skin cancer 03/12/2014   basal- back    Past Surgical History:  Procedure Laterality Date  . ABDOMINAL HYSTERECTOMY  2007   with BSO  . APPENDECTOMY  2007  . AXILLARY LYMPH NODE DISSECTION Left 02/14/2016   Procedure: LEFT AXILLARY LYMPH NODE DISSECTION;  Surgeon: [name], MD;  Location: [place];  Service: General;  Laterality: Left;  . BREAST RECONSTRUCTION WITH PLACEMENT OF TISSUE EXPANDER AND FLEX HD (ACELLULAR HYDRATED DERMIS) Left 02/02/2016   Procedure: IMMEDIATE LEFT BREAST RECONSTRUCTION WITH PLACEMENT OF TISSUE EXPANDER AND FLEX HD (ACELLULAR HYDRATED  DERMIS);  Surgeon: [name], DO;  Location: [place];  Service: Plastics;  Laterality: Left;  . BREAST REDUCTION SURGERY Right 03/28/2017   Procedure: RIGHT MAMMARY REDUCTION  (BREAST);  Surgeon: [name], DO;  Location: [place];  Service: Plastics;  Laterality: Right;  . BREAST SURGERY Left 02/02/16   mastectomy with reconstruction  . c-section  1991  . COLON RESECTION SIGMOID  12/02/2012   [facility]   . HERNIA REPAIR  2007  . LAPAROSCOPIC ENDOMETRIOSIS FULGURATION  1984, 1989  . MASTOPEXY Right 03/28/2017   Procedure: MASTOPEXY;  Surgeon: [name], DO;  Location: [place];  Service: Plastics;  Laterality: Right;  . Port- a- cath placement  07/2015  . PORT-A-CATH REMOVAL Right 02/02/2016   Procedure: REMOVAL PORT-A-CATH;  Surgeon: [name], MD;  Location: [place];  Service: General;  Laterality: Right;  . RADIOACTIVE SEED GUIDED AXILLARY SENTINEL LYMPH NODE Left 02/02/2016   Procedure: LEFT BREAST MASTECTOMY AND RADIOACTIVE SEED GUIDED LEFT SENTINEL LYMPH NODE BIOPSY ;  Surgeon: [name], MD;  Location: [place];  Service: General;  Laterality: Left;  . REMOVAL OF TISSUE EXPANDER AND PLACEMENT OF IMPLANT Left 03/28/2017   Procedure: REMOVAL OF TISSUE EXPANDER AND PLACEMENT OF IMPLANT;  Surgeon: [name], DO;  Location: [place];  Service: Plastics;  Laterality: Left;    There were no vitals filed for this visit.  Subjective Assessment - 07/25/17 1615    Subjective  Pt had some nausea earlier this week, but feels better today.  She has fullness and tightness in abdomen especially on the left side, wth increased pain in left axilla     Pertinent History  Breast cancer diagnosed November2016 She had chemotherapy. but did have some neuropathy so it had to be stopped. On June 8, she had a mastectomy with immedicate expander placement and with first set of  lymphnodes removed, On June 20 she went for complete axillary lymph node dissection. She says she has had always been "full necked" but has neck and upper body fullness since the cancer was diagnosed. She reports she has osteopenia in low back and has had a fracture that was discovered in 2014 when she had a Dexa scan. Pt has had metastasis to the bones of the spine and recent  bilateral breast surgery for a reduction on the right and expander removal on the left with implant placement and removal     Patient Stated Goals  to decrease the pain     Currently in Pain?  Yes    Pain Score  6     Pain Location  Axilla    Pain Orientation  Left            LYMPHEDEMA/ONCOLOGY QUESTIONNAIRE - 07/25/17 1524      Head and Neck   Other  101.5 around abdomen at widest part   firmness at left chest     Other  [facility] Adult PT Treatment/Exercise - 07/25/17 0001      Manual Therapy   Myofascial Release  crosshands to very tight left chest and scar with little release of tissue today     Manual Lymphatic Drainage (MLD)  in supine short neck, superficial and deep abdominals, right axillary nodes, anterior interaxillary anastamosis, left axilla and arm, extra time spent on abdomen  arm to  hand. then to right sidelying for left lateral trunk and back                 Short Term Clinic Goals - 06/25/17 1750      CC Short Term Goal  #1   Title  Pt will report she knows strategies for good body mechanics to decrease low back pain     Time  8    Period  Weeks    Status  On-going      CC Short Term Goal  #2   Title  Pt will have a deacrease in circumference 15 cm prox to ulnar styloid of left forearm by 1 cm     Baseline  25 cm , on 05/16/2017 it is 23.5    Status  Achieved      CC Short Term Goal  #3   Title  Pt will report she knows how to manage left UE  lymphedema with use of compression, elevation and exercise, self manual lymph drainage and possibly Flexitouch     Baseline  Pt has day and nighttime garments and Flexitouch     Status  Achieved          Long Term Clinic Goals - 06/25/17 1751      CC Long Term Goal  #1   Title  Patient will report  that she can manage back pain  so she can perform daily activities with greater ease    Status  On-going      CC Long Term Goal  #2   Title  Pt will  have given the TENS unit a trial to see if it will help with back pain     Baseline  has not needed to use TENS     Time  8    Period  Weeks    Status  On-going      CC Long Term Goal  #3   Title  Pt will be independent in core and  LE strengthening exercises     Time  8    Period  Weeks    Status  On-going      CC Long Term Goal  #4   Title  Pt will report low back pain is decreased to a 4/10 so that she can perform activities of daily living easier     Status  Achieved         Plan - 07/25/17 1620    Clinical Impression Statement  Pt with visible increased fullness in abdomen with pain in left axilla  she has decreased tissue excursion at chest Focus of treatment on manual lymph draiange and tissue excursion.  Pt felt better at end of session     Rehab Potential  Fair    Clinical Impairments Affecting Rehab Potential  chemotherapy with neuropathy , radiation to back     PT Frequency  2x / week    PT Next Visit Plan   Reassess for goals next session continue with dynamic gait and LE strengthening exercise. continue with BOSU or airex. add walking lunges  Monitor lymphedema, continue standing isometrics and progress core  general exercise for strengthening and pain managment with TENS as needed. MLD as needed for pain managment of left axillary discomfort        Patient will benefit from skilled therapeutic intervention in order to improve the following deficits and impairments:  Decreased range of motion, Impaired UE functional  use, Decreased activity tolerance, Decreased knowledge of precautions, Decreased skin integrity, Impaired perceived functional ability, Pain, Decreased knowledge of use of DME, Increased edema, Postural dysfunction, Decreased strength, Impaired sensation, Decreased scar mobility, Decreased mobility, Increased muscle spasms  Visit Diagnosis: Abnormal posture  Low back pain with sciatica, sciatica laterality unspecified, unspecified back pain laterality, unspecified chronicity  Lymphedema, not elsewhere classified  Chronic left shoulder pain  Stiffness of left shoulder joint     Problem List Patient Active Problem List   Diagnosis Date Noted  . Counseling regarding goals of care 04/16/2017  . Bone metastases ([place]) 02/28/2017  . Uncontrolled pain 02/28/2017  . Osteopenia determined by x-ray 09/20/2016  . Primary malignant neoplasm of left breast with stage 2 nodal metastasis per American Joint Committee on Cancer 7th edition (N2) ([place]) 02/14/2016  . Chemotherapy-induced neuropathy ([place]) 12/30/2015  . Insomnia 09/01/2015  . Malignant neoplasm of upper-outer quadrant of left breast in female, estrogen receptor positive ([place]) 08/08/2015   Donato Heinz. Owens Shark PT  Norwood Levo 07/25/2017, 4:26 PM  [place], [place], [address] Phone: [phone]   Fax:  [phone]  Name: Alison [name] MRN: [id number] Date of Birth: [birth year]

## 2017-07-30 ENCOUNTER — Encounter: Payer: Self-pay | Admitting: Physical Therapy

## 2017-07-30 ENCOUNTER — Other Ambulatory Visit: Payer: Self-pay

## 2017-07-30 ENCOUNTER — Ambulatory Visit: Payer: 59 | Attending: General Surgery | Admitting: Physical Therapy

## 2017-07-30 DIAGNOSIS — M25512 Pain in left shoulder: Secondary | ICD-10-CM | POA: Insufficient documentation

## 2017-07-30 DIAGNOSIS — R221 Localized swelling, mass and lump, neck: Secondary | ICD-10-CM | POA: Diagnosis present

## 2017-07-30 DIAGNOSIS — R293 Abnormal posture: Secondary | ICD-10-CM

## 2017-07-30 DIAGNOSIS — G8929 Other chronic pain: Secondary | ICD-10-CM

## 2017-07-30 DIAGNOSIS — M5440 Lumbago with sciatica, unspecified side: Secondary | ICD-10-CM | POA: Diagnosis present

## 2017-07-30 DIAGNOSIS — M25612 Stiffness of left shoulder, not elsewhere classified: Secondary | ICD-10-CM | POA: Insufficient documentation

## 2017-07-30 NOTE — Therapy (Signed)
[place], [place], [address] Phone: [phone]   Fax:  [phone]  Physical Therapy Treatment  Patient Details  Name: Alison [name] MRN: [id number] Date of Birth: 11-24-1960 Referring Provider: Dr. [name]   Encounter Date: 07/30/2017  PT End of Session - 07/30/17 1903    Visit Number  47    Number of Visits  55    Date for PT Re-Evaluation  08/26/17    PT Start Time  8185    PT Stop Time  1600    PT Time Calculation (min)  45 min    Activity Tolerance  Patient tolerated treatment well    Behavior During Therapy  [facility] for tasks assessed/performed       Past Medical History:  Diagnosis Date  . Anxiety   . Asthma     seasonal asthma  . Breast cancer ([place])    metastatic breast cancer  . Complication of anesthesia 2007   aspiration pneumonia post hysterectomy.  Blood pressure would drop low- but no problems 01/2016- surgery  . History of blood transfusion   . History of mitral valve prolapse 1988   with palpitations  . History of radiation therapy 04/02/16- 05/21/16   Left Chest Wall, SCV, Axilla, IM nodes  . Hypertension    no meds  . Neuropathy    with chemo  . Osteopenia determined by x-ray   . Pneumonia    1980's and 1990's  . Raynaud's disease   . 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 Skin cancer 03/12/2014   basal- back    Past Surgical History:  Procedure Laterality Date  . ABDOMINAL HYSTERECTOMY  2007   with BSO  . APPENDECTOMY  2007  . AXILLARY LYMPH NODE DISSECTION Left 02/14/2016   Procedure: LEFT AXILLARY LYMPH NODE DISSECTION;  Surgeon: [name], MD;  Location: [place];  Service: General;  Laterality: Left;  . BREAST RECONSTRUCTION WITH PLACEMENT OF TISSUE EXPANDER AND FLEX HD (ACELLULAR HYDRATED DERMIS) Left 02/02/2016   Procedure: IMMEDIATE LEFT BREAST RECONSTRUCTION WITH PLACEMENT OF TISSUE EXPANDER AND FLEX HD (ACELLULAR HYDRATED  DERMIS);  Surgeon: [name], DO;  Location: [place];  Service: Plastics;  Laterality: Left;  . BREAST REDUCTION SURGERY Right 03/28/2017   Procedure: RIGHT MAMMARY REDUCTION  (BREAST);  Surgeon: [name], DO;  Location: [place];  Service: Plastics;  Laterality: Right;  . BREAST SURGERY Left 02/02/16   mastectomy with reconstruction  . c-section  1991  . COLON RESECTION SIGMOID  12/02/2012   [facility]   . HERNIA REPAIR  2007  . LAPAROSCOPIC ENDOMETRIOSIS FULGURATION  1984, 1989  . MASTOPEXY Right 03/28/2017   Procedure: MASTOPEXY;  Surgeon: [name], DO;  Location: [place];  Service: Plastics;  Laterality: Right;  . Port- a- cath placement  07/2015  . PORT-A-CATH REMOVAL Right 02/02/2016   Procedure: REMOVAL PORT-A-CATH;  Surgeon: [name], MD;  Location: [place];  Service: General;  Laterality: Right;  . RADIOACTIVE SEED GUIDED AXILLARY SENTINEL LYMPH NODE Left 02/02/2016   Procedure: LEFT BREAST MASTECTOMY AND RADIOACTIVE SEED GUIDED LEFT SENTINEL LYMPH NODE BIOPSY ;  Surgeon: [name], MD;  Location: [place];  Service: General;  Laterality: Left;  . REMOVAL OF TISSUE EXPANDER AND PLACEMENT OF IMPLANT Left 03/28/2017   Procedure: REMOVAL OF TISSUE EXPANDER AND PLACEMENT OF IMPLANT;  Surgeon: [name], DO;  Location: [place];  Service: Plastics;  Laterality: Left;    There were no vitals filed for this visit.  Subjective Assessment - 07/30/17 1522    Subjective  Pt states she feels better today.  She was dizzy and nauseated yesterday but is better today     Pertinent History  Breast cancer diagnosed November2016 She had chemotherapy. but did have some neuropathy so it had to be stopped. On June 8, she had a mastectomy with immedicate expander placement and with first set of lymphnodes removed, On June 20 she went for complete axillary lymph node  dissection. She says she has had always been "full necked" but has neck and upper body fullness since the cancer was diagnosed. She reports she has osteopenia in low back and has had a fracture that was discovered in 2014 when she had a Dexa scan. Pt has had metastasis to the bones of the spine and recent  bilateral breast surgery for a reduction on the right and expander removal on the left with implant placement and removal     Patient Stated Goals  to decrease the pain     Currently in Pain?  No/denies                      [facility] Adult PT Treatment/Exercise - 07/30/17 0001      Elbow Exercises   Elbow Flexion  Strengthening;Right;Left;5 reps;Seated;Bar weights/barbell;Other (comment) sitting on inflatable disc    Bar Weights/Barbell (Elbow Flexion)  2 lbs      Lumbar Exercises: Aerobic   Tread Mill  0% grad 1.5 mph for 13 minutes       Lumbar Exercises: Standing   Heel Raises  --    Wall Slides  5 reps;5 seconds    Other Standing Lumbar Exercises  marches and sidestepping x 40 feet     Other Standing Lumbar Exercises  leaning forward with arms on armrests of chair leg extesnion 10 reps with each leg       Lumbar Exercises: Quadruped   Single Arm Raise  Right;Left;5 reps    Straight Leg Raise  5 reps    Opposite Arm/Leg Raise  Right arm/Left leg;Left arm/Right leg;1 second 1 rep      Knee/Hip Exercises: Standing   Lateral Step Up  Right;Left;10 reps;Step Height: 6" holding onto 2 dowels for balance    Step Down  Right;10 reps wiht backward step ups     Wall Squat  10 reps    Other Standing Knee Exercises  high march leg raise to step up onto mat                 Short Term Clinic Goals - 06/25/17 1750      CC Short Term Goal  #1   Title  Pt will report she knows strategies for good body mechanics to decrease low back pain     Time  8    Period  Weeks    Status  On-going      CC Short Term Goal  #2   Title  Pt will have a deacrease in circumference 15  cm prox to ulnar styloid of left forearm by 1 cm     Baseline  25 cm , on 05/16/2017 it is 23.5    Status  Achieved      CC Short Term Goal  #3   Title  Pt will report she knows how to manage left UE lymphedema  with use of compression, elevation and exercise, self manual lymph drainage and possibly Flexitouch     Baseline  Pt has day and nighttime garments and Flexitouch     Status  Achieved          Long Term Clinic Goals - 06/25/17 1751      CC Long Term Goal  #1   Title  Patient will report  that she can manage back pain  so she can perform daily activities with greater ease    Status  On-going      CC Long Term Goal  #2   Title  Pt will  have given the TENS unit a trial to see if it will help with back pain     Baseline  has not needed to use TENS     Time  8    Period  Weeks    Status  On-going      CC Long Term Goal  #3   Title  Pt will be independent in core and  LE strengthening exercises     Time  8    Period  Weeks    Status  On-going      CC Long Term Goal  #4   Title  Pt will report low back pain is decreased to a 4/10 so that she can perform activities of daily living easier     Status  Achieved         Plan - 07/30/17 1903    Clinical Impression Statement  Pt is doing better today and states she can see functional improvments from exercise such as being able to go up and down the steps better at home She wants to work on ways to get up and down from the floor easier as she still has problems with that.  She still is having problems with occasional dizziness that can limit her activities.     PT Treatment/Interventions  ADLs/Self Care Home Management;Manual lymph drainage;Compression bandaging;Scar mobilization;Passive range of motion;Patient/family education;Taping;Manual techniques;Therapeutic exercise;Therapeutic activities;Orthotic Fit/Training;DME Instruction;Electrical Stimulation;Moist Heat    PT Next Visit Plan   Reassess for goals next session continue  with dynamic gait and LE strengthening exercise. continue with BOSU or airex. add walking lunges  Monitor lymphedema, continue standing isometrics and progress core  general exercise for strengthening and pain managment with TENS as needed. MLD as needed for pain managment of left axillary discomfort     Consulted and Agree with Plan of Care  Patient       Patient will benefit from skilled therapeutic intervention in order to improve the following deficits and impairments:     Visit Diagnosis: Abnormal posture  Localized swelling, mass and lump, neck  Chronic left shoulder pain  Low back pain with sciatica, sciatica laterality unspecified, unspecified back pain laterality, unspecified chronicity  Stiffness of left shoulder joint     Problem List Patient Active Problem List   Diagnosis Date Noted  . Counseling regarding goals of care 04/16/2017  . Bone metastases ([place]) 02/28/2017  . Uncontrolled pain 02/28/2017  . Osteopenia determined by x-ray 09/20/2016  . Primary malignant neoplasm of left breast with stage 2 nodal metastasis per American Joint Committee on Cancer 7th edition (N2) ([place]) 02/14/2016  . Chemotherapy-induced neuropathy ([place]) 12/30/2015  . Insomnia 09/01/2015  . Malignant neoplasm of upper-outer quadrant of left breast in female, estrogen receptor positive ([place]) 08/08/2015    Norwood Levo 07/30/2017, 7:07 PM  [place]  [place], [place], [address] Phone: [phone]   Fax:  [phone]  Name: Alison [name] MRN: [id number] Date of Birth: 07/19/1961

## 2017-07-30 NOTE — Therapy (Signed)
[place], [place], [address] Phone: [phone]   Fax:  [phone]  Physical Therapy Treatment  Patient Details  Name: Alison [name] MRN: [id number] Date of Birth: January 28, 1961 Referring Provider: Dr. [name]   Encounter Date: 07/30/2017  PT End of Session - 07/30/17 1903    Visit Number  47    Number of Visits  55    Date for PT Re-Evaluation  08/26/17    PT Start Time  1962    PT Stop Time  1600    PT Time Calculation (min)  45 min    Activity Tolerance  Patient tolerated treatment well    Behavior During Therapy  [facility] for tasks assessed/performed       Past Medical History:  Diagnosis Date  . Anxiety   . Asthma     seasonal asthma  . Breast cancer ([place])    metastatic breast cancer  . Complication of anesthesia 2007   aspiration pneumonia post hysterectomy.  Blood pressure would drop low- but no problems 01/2016- surgery  . History of blood transfusion   . History of mitral valve prolapse 1988   with palpitations  . History of radiation therapy 04/02/16- 05/21/16   Left Chest Wall, SCV, Axilla, IM nodes  . Hypertension    no meds  . Neuropathy    with chemo  . Osteopenia determined by x-ray   . Pneumonia    1980's and 1990's  . Raynaud's disease   . 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 Skin cancer 03/12/2014   basal- back    Past Surgical History:  Procedure Laterality Date  . ABDOMINAL HYSTERECTOMY  2007   with BSO  . APPENDECTOMY  2007  . AXILLARY LYMPH NODE DISSECTION Left 02/14/2016   Procedure: LEFT AXILLARY LYMPH NODE DISSECTION;  Surgeon: [name], MD;  Location: [place];  Service: General;  Laterality: Left;  . BREAST RECONSTRUCTION WITH PLACEMENT OF TISSUE EXPANDER AND FLEX HD (ACELLULAR HYDRATED DERMIS) Left 02/02/2016   Procedure: IMMEDIATE LEFT BREAST RECONSTRUCTION WITH PLACEMENT OF TISSUE EXPANDER AND FLEX HD (ACELLULAR HYDRATED  DERMIS);  Surgeon: [name], DO;  Location: [place];  Service: Plastics;  Laterality: Left;  . BREAST REDUCTION SURGERY Right 03/28/2017   Procedure: RIGHT MAMMARY REDUCTION  (BREAST);  Surgeon: [name], DO;  Location: [place];  Service: Plastics;  Laterality: Right;  . BREAST SURGERY Left 02/02/16   mastectomy with reconstruction  . c-section  1991  . COLON RESECTION SIGMOID  12/02/2012   [facility]   . HERNIA REPAIR  2007  . LAPAROSCOPIC ENDOMETRIOSIS FULGURATION  1984, 1989  . MASTOPEXY Right 03/28/2017   Procedure: MASTOPEXY;  Surgeon: [name], DO;  Location: [place];  Service: Plastics;  Laterality: Right;  . Port- a- cath placement  07/2015  . PORT-A-CATH REMOVAL Right 02/02/2016   Procedure: REMOVAL PORT-A-CATH;  Surgeon: [name], MD;  Location: [place];  Service: General;  Laterality: Right;  . RADIOACTIVE SEED GUIDED AXILLARY SENTINEL LYMPH NODE Left 02/02/2016   Procedure: LEFT BREAST MASTECTOMY AND RADIOACTIVE SEED GUIDED LEFT SENTINEL LYMPH NODE BIOPSY ;  Surgeon: [name], MD;  Location: [place];  Service: General;  Laterality: Left;  . REMOVAL OF TISSUE EXPANDER AND PLACEMENT OF IMPLANT Left 03/28/2017   Procedure: REMOVAL OF TISSUE EXPANDER AND PLACEMENT OF IMPLANT;  Surgeon: [name], DO;  Location: [place];  Service: Plastics;  Laterality: Left;    There were no vitals filed for this visit.  Subjective Assessment - 07/30/17 1522    Subjective  Pt states she feels better today.  She was dizzy and nauseated yesterday but is better today     Pertinent History  Breast cancer diagnosed November2016 She had chemotherapy. but did have some neuropathy so it had to be stopped. On June 8, she had a mastectomy with immedicate expander placement and with first set of lymphnodes removed, On June 20 she went for complete axillary lymph node  dissection. She says she has had always been "full necked" but has neck and upper body fullness since the cancer was diagnosed. She reports she has osteopenia in low back and has had a fracture that was discovered in 2014 when she had a Dexa scan. Pt has had metastasis to the bones of the spine and recent  bilateral breast surgery for a reduction on the right and expander removal on the left with implant placement and removal     Patient Stated Goals  to decrease the pain     Currently in Pain?  No/denies                      [facility] Adult PT Treatment/Exercise - 07/30/17 0001      Elbow Exercises   Elbow Flexion  Strengthening;Right;Left;5 reps;Seated;Bar weights/barbell;Other (comment) sitting on inflatable disc    Bar Weights/Barbell (Elbow Flexion)  2 lbs      Lumbar Exercises: Aerobic   Tread Mill  0% grad 1.5 mph for 13 minutes       Lumbar Exercises: Standing   Heel Raises  --    Wall Slides  5 reps;5 seconds    Other Standing Lumbar Exercises  marches and sidestepping x 40 feet     Other Standing Lumbar Exercises  leaning forward with arms on armrests of chair leg extesnion 10 reps with each leg       Lumbar Exercises: Quadruped   Single Arm Raise  Right;Left;5 reps    Straight Leg Raise  5 reps    Opposite Arm/Leg Raise  Right arm/Left leg;Left arm/Right leg;1 second 1 rep      Knee/Hip Exercises: Standing   Lateral Step Up  Right;Left;10 reps;Step Height: 6" holding onto 2 dowels for balance    Step Down  Right;10 reps wiht backward step ups     Wall Squat  10 reps    Other Standing Knee Exercises  high march leg raise to step up onto mat                 Short Term Clinic Goals - 06/25/17 1750      CC Short Term Goal  #1   Title  Pt will report she knows strategies for good body mechanics to decrease low back pain     Time  8    Period  Weeks    Status  On-going      CC Short Term Goal  #2   Title  Pt will have a deacrease in circumference 15  cm prox to ulnar styloid of left forearm by 1 cm     Baseline  25 cm , on 05/16/2017 it is 23.5    Status  Achieved      CC Short Term Goal  #3   Title  Pt will report she knows how to manage left UE lymphedema  with use of compression, elevation and exercise, self manual lymph drainage and possibly Flexitouch     Baseline  Pt has day and nighttime garments and Flexitouch     Status  Achieved          Long Term Clinic Goals - 06/25/17 1751      CC Long Term Goal  #1   Title  Patient will report  that she can manage back pain  so she can perform daily activities with greater ease    Status  On-going      CC Long Term Goal  #2   Title  Pt will  have given the TENS unit a trial to see if it will help with back pain     Baseline  has not needed to use TENS     Time  8    Period  Weeks    Status  On-going      CC Long Term Goal  #3   Title  Pt will be independent in core and  LE strengthening exercises     Time  8    Period  Weeks    Status  On-going      CC Long Term Goal  #4   Title  Pt will report low back pain is decreased to a 4/10 so that she can perform activities of daily living easier     Status  Achieved         Plan - 07/30/17 1903    Clinical Impression Statement  Pt is doing better today and states she can see functional improvments from exercise such as being able to go up and down the steps better at home She wants to work on ways to get up and down from the floor easier as she still has problems with that.  She still is having problems with occasional dizziness that can limit her activities.     PT Treatment/Interventions  ADLs/Self Care Home Management;Manual lymph drainage;Compression bandaging;Scar mobilization;Passive range of motion;Patient/family education;Taping;Manual techniques;Therapeutic exercise;Therapeutic activities;Orthotic Fit/Training;DME Instruction;Electrical Stimulation;Moist Heat    PT Next Visit Plan   Reassess for goals next session continue  with dynamic gait and LE strengthening exercise. continue with BOSU or airex. add walking lunges  Monitor lymphedema, continue standing isometrics and progress core  general exercise for strengthening and pain managment with TENS as needed. MLD as needed for pain managment of left axillary discomfort     Consulted and Agree with Plan of Care  Patient       Patient will benefit from skilled therapeutic intervention in order to improve the following deficits and impairments:     Visit Diagnosis: Abnormal posture  Localized swelling, mass and lump, neck  Chronic left shoulder pain  Low back pain with sciatica, sciatica laterality unspecified, unspecified back pain laterality, unspecified chronicity  Stiffness of left shoulder joint     Problem List Patient Active Problem List   Diagnosis Date Noted  . Counseling regarding goals of care 04/16/2017  . Bone metastases ([place]) 02/28/2017  . Uncontrolled pain 02/28/2017  . Osteopenia determined by x-ray 09/20/2016  . Primary malignant neoplasm of left breast with stage 2 nodal metastasis per American Joint Committee on Cancer 7th edition (N2) ([place]) 02/14/2016  . Chemotherapy-induced neuropathy ([place]) 12/30/2015  . Insomnia 09/01/2015  . Malignant neoplasm of upper-outer quadrant of left breast in female, estrogen receptor positive ([place]) 08/08/2015   Donato Heinz. Owens Shark, PT  Norwood Levo 07/30/2017, 7:10 PM  [place]  [employer] [place], [place], [address] Phone: [phone]   Fax:  [phone]  Name: LAIYLAH ROETTGER MRN: [id number] Date of Birth: May 08, 1961

## 2017-07-31 ENCOUNTER — Other Ambulatory Visit: Payer: Self-pay | Admitting: *Deleted

## 2017-07-31 DIAGNOSIS — Z170 Estrogen receptor positive status [ER+]: Principal | ICD-10-CM

## 2017-07-31 DIAGNOSIS — C50412 Malignant neoplasm of upper-outer quadrant of left female breast: Secondary | ICD-10-CM

## 2017-07-31 MED ORDER — OXYCODONE HCL 15 MG PO TABS: 15.0000 mg | ORAL_TABLET | ORAL | 0 refills | Status: DC | PRN

## 2017-08-01 ENCOUNTER — Encounter: Payer: 59 | Admitting: Physical Therapy

## 2017-08-06 ENCOUNTER — Ambulatory Visit: Payer: 59 | Admitting: Physical Therapy

## 2017-08-08 ENCOUNTER — Encounter: Payer: 59 | Admitting: Physical Therapy

## 2017-08-09 ENCOUNTER — Telehealth: Payer: Self-pay

## 2017-08-09 ENCOUNTER — Other Ambulatory Visit: Payer: Self-pay

## 2017-08-09 DIAGNOSIS — C50412 Malignant neoplasm of upper-outer quadrant of left female breast: Secondary | ICD-10-CM

## 2017-08-09 DIAGNOSIS — Z170 Estrogen receptor positive status [ER+]: Principal | ICD-10-CM

## 2017-08-09 MED ORDER — OXYCODONE HCL 15 MG PO TABS: 15.0000 mg | ORAL_TABLET | ORAL | 0 refills | Status: DC | PRN

## 2017-08-09 NOTE — Telephone Encounter (Signed)
Prescription for oxycodone 15mg  placed in the prescription binder in triage

## 2017-08-12 ENCOUNTER — Telehealth: Payer: Self-pay

## 2017-08-12 NOTE — Telephone Encounter (Signed)
Alison [name] at Dr Maximiano Coss office stated pt was there to get her teeth cleaned and mentioned she is on oral chemo. She is callling for OK for teeth cleaning.   S/w Dr [name] and he said OK to get her teeth cleaned.

## 2017-08-13 ENCOUNTER — Encounter: Payer: Self-pay | Admitting: Physical Therapy

## 2017-08-13 ENCOUNTER — Ambulatory Visit: Payer: 59 | Admitting: Physical Therapy

## 2017-08-13 DIAGNOSIS — R221 Localized swelling, mass and lump, neck: Secondary | ICD-10-CM

## 2017-08-13 DIAGNOSIS — M5440 Lumbago with sciatica, unspecified side: Secondary | ICD-10-CM

## 2017-08-13 DIAGNOSIS — M25512 Pain in left shoulder: Secondary | ICD-10-CM

## 2017-08-13 DIAGNOSIS — M25612 Stiffness of left shoulder, not elsewhere classified: Secondary | ICD-10-CM

## 2017-08-13 DIAGNOSIS — R293 Abnormal posture: Secondary | ICD-10-CM | POA: Diagnosis not present

## 2017-08-13 DIAGNOSIS — G8929 Other chronic pain: Secondary | ICD-10-CM

## 2017-08-13 NOTE — Therapy (Signed)
[place], [place], [address] Phone: [phone]   Fax:  [phone]  Physical Therapy Treatment  Patient Details  Name: Alison [name] MRN: [id number] Date of Birth: Oct 12, 1960 Referring Provider: Dr. [name]   Encounter Date: 08/13/2017  PT End of Session - 08/13/17 1740    Visit Number  48    Number of Visits  55    Date for PT Re-Evaluation  08/26/17    PT Start Time  2536    PT Stop Time  1600    PT Time Calculation (min)  45 min    Activity Tolerance  Patient tolerated treatment well    Behavior During Therapy  [facility] for tasks assessed/performed       Past Medical History:  Diagnosis Date  . Anxiety   . Asthma     seasonal asthma  . Breast cancer ([place])    metastatic breast cancer  . Complication of anesthesia 2007   aspiration pneumonia post hysterectomy.  Blood pressure would drop low- but no problems 01/2016- surgery  . History of blood transfusion   . History of mitral valve prolapse 1988   with palpitations  . History of radiation therapy 04/02/16- 05/21/16   Left Chest Wall, SCV, Axilla, IM nodes  . Hypertension    no meds  . Neuropathy    with chemo  . Osteopenia determined by x-ray   . Pneumonia    1980's and 1990's  . Raynaud's disease   . 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 Skin cancer 03/12/2014   basal- back    Past Surgical History:  Procedure Laterality Date  . ABDOMINAL HYSTERECTOMY  2007   with BSO  . APPENDECTOMY  2007  . AXILLARY LYMPH NODE DISSECTION Left 02/14/2016   Procedure: LEFT AXILLARY LYMPH NODE DISSECTION;  Surgeon: [name], MD;  Location: [place];  Service: General;  Laterality: Left;  . BREAST RECONSTRUCTION WITH PLACEMENT OF TISSUE EXPANDER AND FLEX HD (ACELLULAR HYDRATED DERMIS) Left 02/02/2016   Procedure: IMMEDIATE LEFT BREAST RECONSTRUCTION WITH PLACEMENT OF TISSUE EXPANDER AND FLEX HD (ACELLULAR HYDRATED  DERMIS);  Surgeon: [name], DO;  Location: [place];  Service: Plastics;  Laterality: Left;  . BREAST REDUCTION SURGERY Right 03/28/2017   Procedure: RIGHT MAMMARY REDUCTION  (BREAST);  Surgeon: [name], DO;  Location: [facility] [place];  Service: Plastics;  Laterality: Right;  . BREAST SURGERY Left 02/02/16   mastectomy with reconstruction  . c-section  1991  . COLON RESECTION SIGMOID  12/02/2012   [facility]   . HERNIA REPAIR  2007  . LAPAROSCOPIC ENDOMETRIOSIS FULGURATION  1984, 1989  . MASTOPEXY Right 03/28/2017   Procedure: MASTOPEXY;  Surgeon: [name], DO;  Location: [facility];  Service: Plastics;  Laterality: Right;  . Port- a- cath placement  07/2015  . PORT-A-CATH REMOVAL Right 02/02/2016   Procedure: REMOVAL PORT-A-CATH;  Surgeon: [name], MD;  Location: [facility];  Service: General;  Laterality: Right;  . RADIOACTIVE SEED GUIDED AXILLARY SENTINEL LYMPH NODE Left 02/02/2016   Procedure: LEFT BREAST MASTECTOMY AND RADIOACTIVE SEED GUIDED LEFT SENTINEL LYMPH NODE BIOPSY ;  Surgeon: [name], MD;  Location: [facility];  Service: General;  Laterality: Left;  . REMOVAL OF TISSUE EXPANDER AND PLACEMENT OF IMPLANT Left 03/28/2017   Procedure: REMOVAL OF TISSUE EXPANDER AND PLACEMENT OF IMPLANT;  Surgeon: [name], DO;  Location: [facility];  Service: Plastics;  Laterality: Left;    There were no vitals filed for this visit.  Subjective Assessment - 08/13/17 1527    Subjective  Pt states she is feeling better. Notices the dizziness and nausea are related the medicine, but she stopped taking it on the 15th. She says she is having some problems with swelling in the armpit.  she feels that her arm is okay   (Pended)     Pertinent History  Breast cancer diagnosed November2016 She had chemotherapy. but did have some neuropathy so it had to be stopped. On June 8, she  had a mastectomy with immedicate expander placement and with first set of lymphnodes removed, On June 20 she went for complete axillary lymph node dissection. She says she has had always been "full necked" but has neck and upper body fullness since the cancer was diagnosed. She reports she has osteopenia in low back and has had a fracture that was discovered in 2014 when she had a Dexa scan. Pt has had metastasis to the bones of the spine and recent  bilateral breast surgery for a reduction on the right and expander removal on the left with implant placement and removal   (Pended)     Patient Stated Goals  to decrease the pain   (Pended)     Currently in Pain?  Yes  (Pended)     Pain Score  5   (Pended)  has taken medicine.      Pain Location  Axilla  (Pended)  can "feel it" in her back     Pain Orientation  Left  (Pended)     Pain Descriptors / Indicators  Aching  (Pended)     Pain Type  Chronic pain  (Pended)          [facility] PT Assessment - 08/13/17 0001      AROM   Left Shoulder Flexion  142 Degrees    Left Shoulder ABduction  120 Degrees      Palpation   Palpation comment  very tight tissue on left chest with only 5% tissue excursion, tight fullness in axilla                   [facility] Adult PT Treatment/Exercise - 08/13/17 0001      Manual Therapy   Myofascial Release  crosshands to very tight left chest and scar with little release of tissue today     Manual Lymphatic Drainage (MLD)  in supine short neck, superficial and deep abdominals, right axillary nodes, anterior interaxillary anastamosis, left axilla and arm, extra time spent on abdomen  arm to  hand. then to right sidelying for left lateral trunk and back                 Short Term Clinic Goals - 06/25/17 1750      CC Short Term Goal  #1   Title  Pt will report she knows strategies for good body mechanics to decrease low back pain     Time  8    Period  Weeks    Status  On-going      CC Short Term  Goal  #2   Title  Pt will have a deacrease in circumference 15 cm prox to ulnar styloid of left forearm by 1 cm     Baseline  25 cm , on 05/16/2017 it is 23.5    Status  Achieved  CC Short Term Goal  #3   Title  Pt will report she knows how to manage left UE lymphedema with use of compression, elevation and exercise, self manual lymph drainage and possibly Flexitouch     Baseline  Pt has day and nighttime garments and Flexitouch     Status  Achieved          Long Term Clinic Goals - 06/25/17 1751      CC Long Term Goal  #1   Title  Patient will report  that she can manage back pain  so she can perform daily activities with greater ease    Status  On-going      CC Long Term Goal  #2   Title  Pt will  have given the TENS unit a trial to see if it will help with back pain     Baseline  has not needed to use TENS     Time  8    Period  Weeks    Status  On-going      CC Long Term Goal  #3   Title  Pt will be independent in core and  LE strengthening exercises     Time  8    Period  Weeks    Status  On-going      CC Long Term Goal  #4   Title  Pt will report low back pain is decreased to a 4/10 so that she can perform activities of daily living easier     Status  Achieved         Plan - 08/13/17 1741    Clinical Impression Statement  Pt has increased pain today with fullness in her axilla and very tight tissue on left chest.  Shoulder abduciton ROM is decreased Extensive manual work done to this area.  Encourage pt to continue with supine dowel exercise at home.  She reports pain decreased to 3/10 after treatment     Clinical Impairments Affecting Rehab Potential  chemotherapy with neuropathy , radiation to back     PT Frequency  2x / week    PT Duration  8 weeks    PT Treatment/Interventions  ADLs/Self Care Home Management;Manual lymph drainage;Compression bandaging;Scar mobilization;Passive range of motion;Patient/family education;Taping;Manual techniques;Therapeutic  exercise;Therapeutic activities;Orthotic Fit/Training;DME Instruction;Electrical Stimulation;Moist Heat    PT Next Visit Plan   Reassess for goals next session continue with dynamic gait and LE strengthening exercise. continue with BOSU or airex. add walking lunges  Monitor lymphedema, continue standing isometrics and progress core  general exercise for strengthening and pain managment with TENS as needed. MLD as needed for pain managment of left axillary discomfort        Patient will benefit from skilled therapeutic intervention in order to improve the following deficits and impairments:  Decreased range of motion, Impaired UE functional use, Decreased activity tolerance, Decreased knowledge of precautions, Decreased skin integrity, Impaired perceived functional ability, Pain, Decreased knowledge of use of DME, Increased edema, Postural dysfunction, Decreased strength, Impaired sensation, Decreased scar mobility, Decreased mobility, Increased muscle spasms  Visit Diagnosis: Abnormal posture  Localized swelling, mass and lump, neck  Chronic left shoulder pain  Low back pain with sciatica, sciatica laterality unspecified, unspecified back pain laterality, unspecified chronicity  Stiffness of left shoulder joint     Problem List Patient Active Problem List   Diagnosis Date Noted  . Counseling regarding goals of care 04/16/2017  . Bone metastases ([place]) 02/28/2017  . Uncontrolled pain 02/28/2017  .  Osteopenia determined by x-ray 09/20/2016  . Primary malignant neoplasm of left breast with stage 2 nodal metastasis per American Joint Committee on Cancer 7th edition (N2) ([place]) 02/14/2016  . Chemotherapy-induced neuropathy ([place]) 12/30/2015  . Insomnia 09/01/2015  . Malignant neoplasm of upper-outer quadrant of left breast in female, estrogen receptor positive ([place]) 08/08/2015   Donato Heinz. Owens Shark PT  Norwood Levo 08/13/2017, 5:44 PM  [place], [place], [address] Phone: [phone]   Fax:  [phone]  Name: Alison [name] MRN: [id number] Date of Birth: 1961-06-12

## 2017-08-14 ENCOUNTER — Ambulatory Visit (HOSPITAL_BASED_OUTPATIENT_CLINIC_OR_DEPARTMENT_OTHER): Payer: 59 | Admitting: Adult Health

## 2017-08-14 ENCOUNTER — Other Ambulatory Visit (HOSPITAL_BASED_OUTPATIENT_CLINIC_OR_DEPARTMENT_OTHER): Payer: 59

## 2017-08-14 ENCOUNTER — Encounter: Payer: Self-pay | Admitting: Adult Health

## 2017-08-14 ENCOUNTER — Ambulatory Visit (HOSPITAL_BASED_OUTPATIENT_CLINIC_OR_DEPARTMENT_OTHER): Payer: 59

## 2017-08-14 VITALS — BP 164/91 | HR 123 | Temp 98.7°F | Resp 18 | Ht 62.0 in | Wt 147.7 lb

## 2017-08-14 DIAGNOSIS — Z5111 Encounter for antineoplastic chemotherapy: Secondary | ICD-10-CM | POA: Diagnosis not present

## 2017-08-14 DIAGNOSIS — R42 Dizziness and giddiness: Secondary | ICD-10-CM | POA: Diagnosis not present

## 2017-08-14 DIAGNOSIS — C7951 Secondary malignant neoplasm of bone: Secondary | ICD-10-CM

## 2017-08-14 DIAGNOSIS — C50412 Malignant neoplasm of upper-outer quadrant of left female breast: Secondary | ICD-10-CM

## 2017-08-14 DIAGNOSIS — C773 Secondary and unspecified malignant neoplasm of axilla and upper limb lymph nodes: Secondary | ICD-10-CM

## 2017-08-14 DIAGNOSIS — Z170 Estrogen receptor positive status [ER+]: Secondary | ICD-10-CM | POA: Diagnosis not present

## 2017-08-14 DIAGNOSIS — C779 Secondary and unspecified malignant neoplasm of lymph node, unspecified: Principal | ICD-10-CM

## 2017-08-14 DIAGNOSIS — C50912 Malignant neoplasm of unspecified site of left female breast: Secondary | ICD-10-CM

## 2017-08-14 DIAGNOSIS — Z79811 Long term (current) use of aromatase inhibitors: Secondary | ICD-10-CM

## 2017-08-14 DIAGNOSIS — M8580 Other specified disorders of bone density and structure, unspecified site: Secondary | ICD-10-CM

## 2017-08-14 DIAGNOSIS — G893 Neoplasm related pain (acute) (chronic): Secondary | ICD-10-CM

## 2017-08-14 LAB — COMPREHENSIVE METABOLIC PANEL
ALT: 53 U/L (ref 0–55)
AST: 63 U/L — AB (ref 5–34)
Albumin: 3.4 g/dL — ABNORMAL LOW (ref 3.5–5.0)
Alkaline Phosphatase: 84 U/L (ref 40–150)
Anion Gap: 10 mEq/L (ref 3–11)
BUN: 8.3 mg/dL (ref 7.0–26.0)
CHLORIDE: 102 meq/L (ref 98–109)
CO2: 26 meq/L (ref 22–29)
CREATININE: 0.8 mg/dL (ref 0.6–1.1)
Calcium: 9.6 mg/dL (ref 8.4–10.4)
EGFR: >60 mL/min/{1.73_m2} (ref >60)
GLUCOSE: 129 mg/dL (ref 70–140)
Potassium: 4.2 mEq/L (ref 3.5–5.1)
SODIUM: 138 meq/L (ref 136–145)
TOTAL PROTEIN: 6.5 g/dL (ref 6.4–8.3)
Total Bilirubin: 0.44 mg/dL (ref 0.20–1.20)

## 2017-08-14 LAB — CBC WITH DIFFERENTIAL/PLATELET
BASO%: 2 % (ref 0.0–2.0)
BASOS ABS: 0.1 10*3/uL (ref 0.0–0.1)
EOS ABS: 0.3 10*3/uL (ref 0.0–0.5)
EOS%: 10 % — ABNORMAL HIGH (ref 0.0–7.0)
HCT: 36.4 % (ref 34.8–46.6)
HEMOGLOBIN: 12.5 g/dL (ref 11.6–15.9)
LYMPH%: 23.9 % (ref 14.0–49.7)
MCH: 34.2 pg — AB (ref 25.1–34.0)
MCHC: 34.3 g/dL (ref 31.5–36.0)
MCV: 99.7 fL (ref 79.5–101.0)
MONO#: 0.2 10*3/uL (ref 0.1–0.9)
MONO%: 9.6 % (ref 0.0–14.0)
NEUT#: 1.4 10*3/uL — ABNORMAL LOW (ref 1.5–6.5)
NEUT%: 54.5 % (ref 38.4–76.8)
Platelets: 130 10*3/uL — ABNORMAL LOW (ref 145–400)
RBC: 3.65 10*6/uL — ABNORMAL LOW (ref 3.70–5.45)
RDW: 14.1 % (ref 11.2–14.5)
WBC: 2.5 10*3/uL — ABNORMAL LOW (ref 3.9–10.3)
lymph#: 0.6 10*3/uL — ABNORMAL LOW (ref 0.9–3.3)

## 2017-08-14 MED ORDER — FULVESTRANT 250 MG/5ML IM SOLN
INTRAMUSCULAR | Status: AC
  Filled 2017-08-14: qty 5

## 2017-08-14 MED ORDER — OXYCODONE HCL ER 80 MG PO T12A: 80.0000 mg | EXTENDED_RELEASE_TABLET | Freq: Three times a day (TID) | ORAL | 0 refills | Status: DC

## 2017-08-14 MED ORDER — OXYCODONE HCL 15 MG PO TABS: 15.0000 mg | ORAL_TABLET | ORAL | 0 refills | Status: DC | PRN

## 2017-08-14 MED ORDER — PROCHLORPERAZINE MALEATE 10 MG PO TABS: 10.0000 mg | ORAL_TABLET | Freq: Four times a day (QID) | ORAL | 2 refills | Status: AC | PRN

## 2017-08-14 MED ORDER — DENOSUMAB 120 MG/1.7ML ~~LOC~~ SOLN
120.0000 mg | Freq: Once | SUBCUTANEOUS | Status: AC
  Administered 2017-08-14: 120 mg via SUBCUTANEOUS

## 2017-08-14 MED ORDER — FULVESTRANT 250 MG/5ML IM SOLN
500.0000 mg | Freq: Once | INTRAMUSCULAR | Status: AC
  Administered 2017-08-14: 500 mg via INTRAMUSCULAR

## 2017-08-14 MED ORDER — DENOSUMAB 120 MG/1.7ML ~~LOC~~ SOLN
SUBCUTANEOUS | Status: AC
  Filled 2017-08-14: qty 1.7

## 2017-08-14 MED ORDER — ESCITALOPRAM OXALATE 20 MG PO TABS: 20.0000 mg | ORAL_TABLET | Freq: Every day | ORAL | 4 refills | Status: AC

## 2017-08-14 NOTE — Patient Instructions (Signed)

## 2017-08-14 NOTE — Progress Notes (Signed)
[place]  Telephone:([phone] Fax:([phone]) [phone]   ID: CASSY SPROWL DOB: 23-Sep-1960  MR#: [id number]  MHD#:[id number]  Patient Care Team: [name], MD as PCP - General (Internal Medicine) Erroll Luna, MD as Consulting Physician (General Surgery) Magrinat, Virgie Dad, MD as Consulting Physician (Oncology) Aloha Gell, MD as Consulting Physician (Obstetrics and Gynecology) Harriett Sine, MD as Consulting Physician (Dermatology) Benson Norway, RN as Registered Nurse (Oncology) Eppie Gibson, MD as Attending Physician (Radiation Oncology) PCP: [name], MD OTHER MD:  CHIEF COMPLAINT: Estrogen receptor positive breast cancer  CURRENT TREATMENT: Anastrozole, fulvestrant, abemaciclib, denosumab/Xgeva  INTERVAL HISTORY: Neola returns today for follow-up and treatment of her 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 She is taking Anastrozole daily and continues to tolerate it well.  She continues on Fulvestrant and Xgeva every 4 weeks and tolerates them well.  We did hold the Xgeva last time due to some dental issues that have cleared up.  She can receive the Xgeva today.  She also takes Abemaciclib two weeks on and two weeks off, and she is currently in her off period.  She continues to tolerate this well.        REVIEW OF SYSTEMS: Danetra continues to take Oxycodone and Oxycontin for her lower back from the cancer.  Her pain continues to be well controlled with this regimen.  Her bowels are moving normally.  She denies nausea/constipation/diarrhea.  She was reading more in her notes from [facility] that perhaps the swelling in her abdomen could be related to microlymphangitic spread of the tumor.  She is worried that something may be there and that we aren't picking up on it.  Her heart rate is slightly elevated today and she says she is very anxious today and she is every time she comes in here.  She began to cry stating that she has  a fear of progression and the outcome, and that she never really gets to talk to anyone about it.  She is taking Lexapro 73m per day and is tolerating that well.      BREAST CANCER HISTORY: From the original intake note:  LAllinehad routine screening mammography with tomography [facility] February 20 11/14/2014 showing a possible mass in the right breast. Diagnostic right mammography with right breast ultrasonography 11/01/2014 found the breast density to be category C. In the upper outer quadrant of the right breast there was a partially obscured mass which on physical exam was palpable as an area of thickening at the 10:00 position 8 cm from the nipple. Ultrasound showed multiple cysts in the upper outer right breast corresponding to the mass in question.  In November 2016 the patient felt a change in the upper left breast and brought it to her primary care physician's attention. Diagnostic left mammography with tomosynthesis and left breast ultrasonography at the [facility] December 01/14/2015 found trabecular thickening throughout the left breast with skin thickening, but no circumscribed mass or suspicious calcifications. A rounded mass in the left axilla measured 6 mm. There was an additional mildly prominent lymph node in the left axilla which was what the patient had been palpating. Ultrasonography showed ill-defined swelling throughout the inner left breast with overlying skin thickening.. At the 2:00 position 4 cm from the nipple there was an irregular hypoechoic mass measuring 2.3 cm. A second mass at the 1:00 area 3 cm from the nipple measured 1 cm. The distance between these 2 masses was 1.8 cm.  There was also a round hypoechoic left axillary mass measuring 0.6 cm. Overall the was an area of 3.7 cm of mixed echogenicity corresponding to the area of palpable soft tissue swelling.   On 08/04/2015 the patient underwent biopsy of the mass at the 2:00 axis 4 cm from the nipple and a  second biopsy was performed of the hypoechoic mass at the 1:00 position 3 cm from the nipple. Biopsy of the suspicious nodule in the lower left axilla was attempted but could not be completed as the mass became obscured after administration of lidocaine.  The pathology from both left breast biopsies 08/04/2015 ([id number]) showed invasive ductal carcinoma, grade 3. The prognostic profile obtained from the larger tumor showed estrogen receptor to be strongly positive at 95%, progesterone receptor positive at 30%, with an MIB-1 of 12%, and no HER-2 amplification, the signals ratio being 1.22 and the number per cell 2.20.  On 08/09/2015 the patient underwent biopsy of this suspicious left axillary lymph node noted above, and this showed ([id number]) metastatic carcinoma with extracapsular extension.  The patient's subsequent history is as detailed below    PAST MEDICAL HISTORY: Past Medical History:  Diagnosis Date  . Anxiety   . Asthma     seasonal asthma  . Breast cancer ([place])    metastatic breast cancer  . Complication of anesthesia 2007   aspiration pneumonia post hysterectomy.  Blood pressure would drop low- but no problems 01/2016- surgery  . History of blood transfusion   . History of mitral valve prolapse 1988   with palpitations  . History of radiation therapy 04/02/16- 05/21/16   Left Chest Wall, SCV, Axilla, IM nodes  . Hypertension    no meds  . Neuropathy    with chemo  . Osteopenia determined by x-ray   . Pneumonia    1980's and 1990's  . Raynaud's disease   . 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 Skin cancer 03/12/2014   basal- back    PAST SURGICAL HISTORY: Past Surgical History:  Procedure Laterality Date  . ABDOMINAL HYSTERECTOMY  2007   with BSO  . APPENDECTOMY  2007  . AXILLARY LYMPH NODE DISSECTION Left 02/14/2016   Procedure: LEFT AXILLARY LYMPH NODE DISSECTION;  Surgeon: [name], MD;  Location: [place];  Service:  General;  Laterality: Left;  . BREAST RECONSTRUCTION WITH PLACEMENT OF TISSUE EXPANDER AND FLEX HD (ACELLULAR HYDRATED DERMIS) Left 02/02/2016   Procedure: IMMEDIATE LEFT BREAST RECONSTRUCTION WITH PLACEMENT OF TISSUE EXPANDER AND FLEX HD (ACELLULAR HYDRATED DERMIS);  Surgeon: [name], DO;  Location: [place];  Service: Plastics;  Laterality: Left;  . BREAST REDUCTION SURGERY Right 03/28/2017   Procedure: RIGHT MAMMARY REDUCTION  (BREAST);  Surgeon: [name], DO;  Location: [place];  Service: Plastics;  Laterality: Right;  . BREAST SURGERY Left 02/02/16   mastectomy with reconstruction  . c-section  1991  . COLON RESECTION SIGMOID  12/02/2012   [facility]   . HERNIA REPAIR  2007  . LAPAROSCOPIC ENDOMETRIOSIS FULGURATION  1984, 1989  . MASTOPEXY Right 03/28/2017   Procedure: MASTOPEXY;  Surgeon: [name], DO;  Location: [place];  Service: Plastics;  Laterality: Right;  . Port- a- cath placement  07/2015  . PORT-A-CATH REMOVAL Right 02/02/2016   Procedure: REMOVAL PORT-A-CATH;  Surgeon: [name], MD;  Location: [place];  Service: General;  Laterality: Right;  . RADIOACTIVE SEED GUIDED  AXILLARY SENTINEL LYMPH NODE Left 02/02/2016   Procedure: LEFT BREAST MASTECTOMY AND RADIOACTIVE SEED GUIDED LEFT SENTINEL LYMPH NODE BIOPSY ;  Surgeon: [name], MD;  Location: [place];  Service: General;  Laterality: Left;  . REMOVAL OF TISSUE EXPANDER AND PLACEMENT OF IMPLANT Left 03/28/2017   Procedure: REMOVAL OF TISSUE EXPANDER AND PLACEMENT OF IMPLANT;  Surgeon: [name], DO;  Location: [place];  Service: Plastics;  Laterality: Left;    FAMILY HISTORY Family History  Problem Relation Age of Onset  . Lung cancer Father    the patient's father died from lung cancer at the age of 41 in the setting of tobacco abuse. The patient's mother died at the age of 18 with  pancreatic cancer. The patient had no siblings. There is no history of breast or ovarian cancer in the family.  GYNECOLOGIC HISTORY:  No LMP recorded. Patient has had a hysterectomy. Menarche age 48, first live birth age 23. The patient is GX P1. She is status post total hysterectomy with bilateral salpingo-oophorectomy. She has been on hormone replacement since that time and is tapering to off at the time of this dictation.   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 She used to work at [employer]. Her husband [name] works for [employer] division at a [employer] their son [name] is currently at home.    ADVANCED DIRECTIVES: Not in place   HEALTH MAINTENANCE: Social History   Tobacco Use  . Smoking status: Former Smoker    Packs/day: 1.00    Years: 10.00    Pack years: 10.00    Types: Cigarettes    Last attempt to quit: 11/25/2008    Years since quitting: 8.7  . Smokeless tobacco: Never Used  . Tobacco comment: on and off smoker never consistent  Substance Use Topics  . Alcohol use: Yes    Comment: social  . Drug use: No     Colonoscopy: April 2014/ Raynaldo Opitz at [facility]  PAP: s/p hysterectomy  Bone density: August 2016/ osteopenia  Lipid panel:  Allergies  Allergen Reactions  . Sulfa Antibiotics Other (See Comments)    Blisters in mouth    Current Outpatient Medications  Medication Sig Dispense Refill  . anastrozole (ARIMIDEX) 1 MG tablet TAKE ONE TABLET BY MOUTH ONCE DAILY 90 tablet 3  . Ascorbic Acid (VITAMIN C) 1000 MG tablet Take 1,000 mg by mouth 2 (two) times daily.    . cetirizine (ZYRTEC) 10 MG tablet Take 10 mg by mouth daily.    . Cholecalciferol (VITAMIN D3) 2000 UNITS capsule Take 2,000 Units by mouth daily.     Marland Kitchen escitalopram (LEXAPRO) 10 MG tablet Take 1 tablet (10 mg total) by mouth daily. 30 tablet 12  . fluticasone (FLONASE) 50 MCG/ACT nasal spray Place 2 sprays into both nostrils daily. Reported on 11/18/2015    .  gabapentin (NEURONTIN) 300 MG capsule Take 1 capsule (300 mg total) by mouth at bedtime as needed. Take 2 tablets in AM, 2 tablets mid-day, and 4 tablets at bedtime 90 capsule 4  . LORazepam (ATIVAN) 0.5 MG tablet Take 1 tablet (0.5 mg total) by mouth at bedtime. 30 tablet 3  . Multiple Vitamin (MULTIVITAMIN) tablet Take 1 tablet by mouth daily.    . ondansetron (ZOFRAN) 8 MG tablet Take 1 tablet (8 mg total) by mouth every 8 (eight) hours as needed for nausea or vomiting. 20 tablet 5  . oxyCODONE (OXYCONTIN) 80 mg  12 hr tablet Take 1 tablet (80 mg total) by mouth every 8 (eight) hours. 90 tablet 0  . oxyCODONE (ROXICODONE) 15 MG immediate release tablet Take 1-2 tablets (15-30 mg total) by mouth every 4 (four) hours as needed for pain. 90 tablet 0  . Probiotic Product (PROBIOTIC DAILY PO) Take 1 capsule by mouth daily. [place] prochlorperazine (COMPAZINE) 10 MG tablet Take 1 tablet (10 mg total) by mouth every 6 (six) hours as needed (Nausea or vomiting). 30 tablet 2  . traZODone (DESYREL) 50 MG tablet TAKE 3 TABLETS AT BEDTIME 90 tablet 2  . zolpidem (AMBIEN) 5 MG tablet Take 1 tablet (5 mg total) by mouth at bedtime as needed for sleep. for sleep 30 tablet 2   No current facility-administered medications for this visit.     OBJECTIVE:   Vitals:   08/14/17 1054  BP: (!) 164/91  Pulse: (!) 123  Resp: 18  Temp: 98.7 F (37.1 C)  SpO2: 95%     Body mass index is 27.01 kg/m.    ECOG FS:2 - Symptomatic, <50% confined to bed  GENERAL: Patient is a well appearing female in no acute distress HEENT:  Sclerae anicteric.  Oropharynx clear and moist. No ulcerations or evidence of oropharyngeal candidiasis. Neck is supple.  NODES:  No cervical, supraclavicular, or axillary lymphadenopathy palpated.  BREAST EXAM:  Deferred. LUNGS:  Clear to auscultation bilaterally.  No wheezes or rhonchi. HEART:  Regular rate and rhythm. No murmur  appreciated. ABDOMEN:  Soft, nontender.  Positive, normoactive bowel sounds. No organomegaly palpated. MSK:  No focal spinal tenderness to palpation. Full range of motion bilaterally in the upper extremities. EXTREMITIES:  No peripheral edema.   SKIN:  Clear with no obvious rashes or skin changes. No nail dyscrasia. NEURO:  Nonfocal. Well oriented.  Appropriate affect.    LAB RESULTS:  CMP     Component Value Date/Time   NA 140 06/19/2017 1041   K 4.2 06/19/2017 1041   CL 110 02/15/2016 0330   CO2 25 06/19/2017 1041   GLUCOSE 88 06/19/2017 1041   BUN 6.6 (L) 06/19/2017 1041   CREATININE 0.7 06/19/2017 1041   CALCIUM 8.7 06/19/2017 1041   PROT 6.7 06/19/2017 1041   ALBUMIN 3.5 06/19/2017 1041   AST 116 (H) 06/19/2017 1041   ALT 88 (H) 06/19/2017 1041   ALKPHOS 62 06/19/2017 1041   BILITOT 0.32 06/19/2017 1041   GFRNONAA >60 02/15/2016 0330   GFRAA >60 02/15/2016 0330    INo results found for: SPEP, UPEP  Lab Results  Component Value Date   WBC 2.5 (L) 08/14/2017   NEUTROABS 1.4 (L) 08/14/2017   HGB 12.5 08/14/2017   HCT 36.4 08/14/2017   MCV 99.7 08/14/2017   PLT 130 (L) 08/14/2017      Chemistry      Component Value Date/Time   NA 140 06/19/2017 1041   K 4.2 06/19/2017 1041   CL 110 02/15/2016 0330   CO2 25 06/19/2017 1041   BUN 6.6 (L) 06/19/2017 1041   CREATININE 0.7 06/19/2017 1041      Component Value Date/Time   CALCIUM 8.7 06/19/2017 1041   ALKPHOS 62 06/19/2017 1041   AST 116 (H) 06/19/2017 1041   ALT 88 (H) 06/19/2017 1041   BILITOT 0.32 06/19/2017 1041       No results found for: LABCA2  No components found for: [id number]  No results for input(s): INR in  the last 168 hours.  Urinalysis    Component Value Date/Time   COLORURINE YELLOW 11/12/2015 0837   APPEARANCEUR CLEAR 11/12/2015 0837   LABSPEC 1.005 03/18/2017 1223   PHURINE 6.0 03/18/2017 1223   PHURINE 6.5 11/12/2015 0837   GLUCOSEU Negative 03/18/2017 1223   HGBUR Large  03/18/2017 1223   HGBUR MODERATE (A) 11/12/2015 0837   BILIRUBINUR Negative 03/18/2017 1223   KETONESUR Negative 03/18/2017 1223   [place] 11/12/2015 0837   PROTEINUR Negative 03/18/2017 1223   PROTEINUR NEGATIVE 11/12/2015 0837   UROBILINOGEN 0.2 03/18/2017 1223   NITRITE Negative 03/18/2017 1223   NITRITE NEGATIVE 11/12/2015 0837   LEUKOCYTESUR Moderate 03/18/2017 1223    STUDIES: US Breast Ltd Uni Right Inc Axilla  Result Date: 07/16/2017 CLINICAL DATA:  56 year old female with history of left mastectomy for breast cancer metastatic to left axillary lymph nodes. She had a breast reduction of the right breast earlier in 2018. There is concern of a palpable area in the upper-outer right breast, possible seroma identified on clinical exam. At the time of the ultrasound, the patient identified a palpable lump inferior to the right nipple. EXAM: 2D DIGITAL DIAGNOSTIC UNILATERAL RIGHT MAMMOGRAM WITH CAD AND ADJUNCT TOMO RIGHT BREAST ULTRASOUND COMPARISON:  Previous exam(s). ACR Breast Density Category c: The breast tissue is heterogeneously dense, which may obscure small masses. FINDINGS: No suspicious calcifications, masses or areas of distortion are seen in the bilateral breasts. There is an asymmetry in the upper-outer quadrant of the right breast which may represent a small seroma. Mammographic images were processed with CAD. Physical exam of the inferior right breast at the palpable site indicated by the patient demonstrates a small firm area at the patient's reduction scar. Physical exam of the upper-outer quadrant of the right breast demonstrates a firm ridge of tissue. No discrete palpable masses are identified. Ultrasound of the right breast at 6 o'clock, 2 cm from the nipple demonstrates benign scarring. No suspicious masses are identified. Ultrasound of the upper-outer quadrant of the right breast demonstrates normal fibroglandular tissue. Some areas of surgical change are  identified which are without suspicious appearance. IMPRESSION: 1. The palpable area in inferior right breast identified on self breast exam corresponds with the patient's surgical scar. 2. The palpable area in the upper-outer quadrant of the right breast identified on clinical breast exam demonstrates normal fibroglandular tissue. No seroma is identified. 3. No mammographic or targeted sonographic evidence of right breast malignancy. RECOMMENDATION: 1. Clinical follow-up recommended for the palpable areas of concern in the right breast. Any further workup should be based on clinical grounds. 2.  Screening mammogram in one year.(Code:SM-B-01Y) I have discussed the findings and recommendations with the patient. Results were also provided in writing at the conclusion of the visit. If applicable, a reminder letter will be sent to the patient regarding the next appointment. BI-RADS CATEGORY  2: Benign. Electronically Signed   By: Ammie Ferrier M.D.   On: 07/16/2017 15:55   Mm Diag Breast Tomo Uni Right  Result Date: 07/16/2017 CLINICAL DATA:  56 year old female with history of left mastectomy for breast cancer metastatic to left axillary lymph nodes. She had a breast reduction of the right breast earlier in 2018. There is concern of a palpable area in the upper-outer right breast, possible seroma identified on clinical exam. At the time of the ultrasound, the patient identified a palpable lump inferior to the right nipple. EXAM: 2D DIGITAL DIAGNOSTIC UNILATERAL RIGHT MAMMOGRAM WITH CAD AND ADJUNCT TOMO RIGHT BREAST  ULTRASOUND COMPARISON:  Previous exam(s). ACR Breast Density Category c: The breast tissue is heterogeneously dense, which may obscure small masses. FINDINGS: No suspicious calcifications, masses or areas of distortion are seen in the bilateral breasts. There is an asymmetry in the upper-outer quadrant of the right breast which may represent a small seroma. Mammographic images were processed with  CAD. Physical exam of the inferior right breast at the palpable site indicated by the patient demonstrates a small firm area at the patient's reduction scar. Physical exam of the upper-outer quadrant of the right breast demonstrates a firm ridge of tissue. No discrete palpable masses are identified. Ultrasound of the right breast at 6 o'clock, 2 cm from the nipple demonstrates benign scarring. No suspicious masses are identified. Ultrasound of the upper-outer quadrant of the right breast demonstrates normal fibroglandular tissue. Some areas of surgical change are identified which are without suspicious appearance. IMPRESSION: 1. The palpable area in inferior right breast identified on self breast exam corresponds with the patient's surgical scar. 2. The palpable area in the upper-outer quadrant of the right breast identified on clinical breast exam demonstrates normal fibroglandular tissue. No seroma is identified. 3. No mammographic or targeted sonographic evidence of right breast malignancy. RECOMMENDATION: 1. Clinical follow-up recommended for the palpable areas of concern in the right breast. Any further workup should be based on clinical grounds. 2.  Screening mammogram in one year.(Code:SM-B-01Y) I have discussed the findings and recommendations with the patient. Results were also provided in writing at the conclusion of the visit. If applicable, a reminder letter will be sent to the patient regarding the next appointment. BI-RADS CATEGORY  2: Benign. Electronically Signed   By: Ammie Ferrier M.D.   On: 07/16/2017 15:55     PROTOCOLS:  participated in [place], [facility] and PALLAS studies, currently off study   ASSESSMENT: 56 y.o. [place] woman status post biopsy of 2 separate masses in the left breast upper outer quadrant 08/04/2015, clinically mT2 N1, stage IIb/IIIa invasive ductal carcinoma, grade 3, the larger mass being estrogen and progesterone receptor positive, HER-2 negative, with an MIB-1 of  12%  (a) biopsy of a left axillary lymph node 08/09/2015 was positive, with extracapsular extension  (1) neoadjuvant chemotherapy started 08/25/2015 consisting of doxorubicin and cyclophosphamide in dose dense fashion 4, completed 10/06/2015,  followed by weekly paclitaxel 10 completed 12/23/2015  (a) final 2 planned cycles of weekly paclitaxel omitted because of neuropathy concerns  (2) status post left mastectomy with targeted axillary dissection 02/02/2016 for a ypT2 ypN2a, stage IIIa invasive ductal carcinoma, grade 2, with negative margins, repeat prognostic panel showing estrogen receptor positive, but progesterone receptor and HER-2 negative  (a) completion axillary lymph node dissection 02/14/2016 showed an additional 2 out of 8 lymph nodes to be involved (final count 6 out of 10 lymph nodes positive  (b) expander placement at the time of left mastectomy  (3) adjuvant capecitabine starting concurrently with radiation--discontinued 05/21/2016  (4) adjuvant  radiation started 04/02/2016, completed 05/21/2016  (5) anastrozole started October 2017  (a) bone density August 2016 showed osteopenia  (b) repeat bone density 09/07/2016 shows a T score of -1.7.  (c) the patient is status post total abdominal hysterectomy with bilateral salpingo-oophorectomy.  (6) postoperative pain?: Starting OxyContin 20 mg twice a day with oxycodone as needed for breakthrough pain 03/02/2016  (a) failed transition to tramadol, gabapentin, naproxen   (b) started methadone 5 mg TID as of 12/20/2016-- not tolerated  (c) robaxin added June 2018 to NSAIDS and  tylenol  (7) Research:  (a) B-WEL study ([facility] [id number])--enrolled in [place] (received education)  (b) PALLAS AFT-05 study: randomized to treatment, started palbociclib 07/26/2016   (i) dose reduced to 100 mg, then to 75 mg because of cytopenias   (ii) went off study study May 2018 because of persistent cytopenias  (c) taxane study 12/20/2016  (d)  aspirin study  (e) went off studies as metastatic disease developed in June 2018  METASTATIC DISEASE 02/15/2017: CT scan of the lumbar spine and 02/27/2017 CT of the thoracic and cervical spine shows multiple bone lesions but no evidence of cord compromise; bone lesions are confirmed on PET scan 02/22/2017 which however shows no evidence of visceral disease  (a) CA-27-29 is noninformative  (8) continuing anastrozole, abemaciclib added 02/22/2017, fulvestrant started 02/22/2017 ,   (9) yearly zolendronate given 09/26/2016, changed to monthly denosumab/Xgeva starting 03/19/2017  (10) radiation 04/01/2017 - 04/23/2017 Site/dose:   The lumbar spine was treated to 35 Gy in 14 fractions of 2.5 Gy.   Other issues: (a) poorly controlled pain: Currently on OxyContin 80 mg po TID/ oxycodone 15-30 mg Q 4h PRN  (i) bowel prophylaxis in place  (b) left upper lobe changes noted on PET scan 02/22/2017 felt to be secondary to radiation  (c) [facility] review suggested discontinuing aromatase inhibitor (she does uncomfortable with that suggestion), and obtaining a bone survey, which was not     PLAN: Aldonia is doing well today.  She will proceed with the Fulvestrant today, along with Xgeva.  Her dental issues have been evaluated, were related to grinding her teeth, and have since resolved.  She knows when to restart the Abemaciclib and will proceed with that when due.  Her labs are stable today and I reviewed them with her in detail.  I refilled her Oxycontin and Oxycodone today.    We reviewed her fear of progression in detail.  She was tearful, but stated it was good to be able to talk about it.  I increased her Lexapro today to 57m daily.  Hopefully this will help.  She is [facility] PET scan in the next couple of weeks.  [facility]  She will return in 4 weeks for labs, f/u with Dr. MJana Hakim and her injections.  She knows to call for any questions  or concerns prior to her next appointment with [place]    A total of (30) minutes of face-to-face time was spent with this patient with greater than 50% of that time in counseling and care-coordination.   LWilber Bihari NP  08/14/17 11:05 AM Medical On[facility]AA[place] [id number]. [phone]   Fax. [phone]

## 2017-08-16 ENCOUNTER — Ambulatory Visit (HOSPITAL_COMMUNITY): Admission: RE | Admit: 2017-08-16 | Payer: 59 | Source: Ambulatory Visit

## 2017-08-23 ENCOUNTER — Other Ambulatory Visit: Payer: Self-pay | Admitting: *Deleted

## 2017-08-23 DIAGNOSIS — C50412 Malignant neoplasm of upper-outer quadrant of left female breast: Secondary | ICD-10-CM

## 2017-08-23 DIAGNOSIS — Z170 Estrogen receptor positive status [ER+]: Principal | ICD-10-CM

## 2017-08-23 MED ORDER — OXYCODONE HCL 15 MG PO TABS: 15.0000 mg | ORAL_TABLET | ORAL | 0 refills | Status: DC | PRN

## 2017-08-28 ENCOUNTER — Ambulatory Visit: Payer: 59 | Attending: General Surgery | Admitting: Physical Therapy

## 2017-08-28 ENCOUNTER — Encounter: Payer: Self-pay | Admitting: Physical Therapy

## 2017-08-28 DIAGNOSIS — M5440 Lumbago with sciatica, unspecified side: Secondary | ICD-10-CM | POA: Diagnosis present

## 2017-08-28 DIAGNOSIS — R208 Other disturbances of skin sensation: Secondary | ICD-10-CM | POA: Insufficient documentation

## 2017-08-28 DIAGNOSIS — R262 Difficulty in walking, not elsewhere classified: Secondary | ICD-10-CM | POA: Insufficient documentation

## 2017-08-28 DIAGNOSIS — R42 Dizziness and giddiness: Secondary | ICD-10-CM | POA: Diagnosis present

## 2017-08-28 DIAGNOSIS — L599 Disorder of the skin and subcutaneous tissue related to radiation, unspecified: Secondary | ICD-10-CM | POA: Diagnosis present

## 2017-08-28 DIAGNOSIS — R2681 Unsteadiness on feet: Secondary | ICD-10-CM | POA: Insufficient documentation

## 2017-08-28 DIAGNOSIS — G8929 Other chronic pain: Secondary | ICD-10-CM | POA: Insufficient documentation

## 2017-08-28 DIAGNOSIS — R296 Repeated falls: Secondary | ICD-10-CM | POA: Diagnosis present

## 2017-08-28 DIAGNOSIS — R221 Localized swelling, mass and lump, neck: Secondary | ICD-10-CM | POA: Diagnosis present

## 2017-08-28 DIAGNOSIS — M25612 Stiffness of left shoulder, not elsewhere classified: Secondary | ICD-10-CM | POA: Diagnosis present

## 2017-08-28 DIAGNOSIS — R293 Abnormal posture: Secondary | ICD-10-CM | POA: Diagnosis not present

## 2017-08-28 DIAGNOSIS — M25512 Pain in left shoulder: Secondary | ICD-10-CM | POA: Diagnosis present

## 2017-08-28 NOTE — Therapy (Signed)
[place], [place], [address] Phone: [phone]   Fax:  [phone]  Physical Therapy Treatment  Patient Details  Name: Alison [name] MRN: [id number] Date of Birth: 1961/08/27 Referring Provider: Dr. [name]    Encounter Date: 08/28/2017  PT End of Session - 08/28/17 1304    Visit Number  1 in 2019    Number of Visits  17    Date for PT Re-Evaluation  10/26/17    PT Start Time  1100    PT Stop Time  1145    PT Time Calculation (min)  45 min    Activity Tolerance  Patient tolerated treatment well    Behavior During Therapy  [facility] for tasks assessed/performed       Past Medical History:  Diagnosis Date  . Anxiety   . Asthma     seasonal asthma  . Breast cancer ([place])    metastatic breast cancer  . Complication of anesthesia 2007   aspiration pneumonia post hysterectomy.  Blood pressure would drop low- but no problems 01/2016- surgery  . History of blood transfusion   . History of mitral valve prolapse 1988   with palpitations  . History of radiation therapy 04/02/16- 05/21/16   Left Chest Wall, SCV, Axilla, IM nodes  . Hypertension    no meds  . Neuropathy    with chemo  . Osteopenia determined by x-ray   . Pneumonia    1980's and 1990's  . Raynaud's disease   . 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 Skin cancer 03/12/2014   basal- back    Past Surgical History:  Procedure Laterality Date  . ABDOMINAL HYSTERECTOMY  2007   with BSO  . APPENDECTOMY  2007  . AXILLARY LYMPH NODE DISSECTION Left 02/14/2016   Procedure: LEFT AXILLARY LYMPH NODE DISSECTION;  Surgeon: [name], MD;  Location: [place];  Service: General;  Laterality: Left;  . BREAST RECONSTRUCTION WITH PLACEMENT OF TISSUE EXPANDER AND FLEX HD (ACELLULAR HYDRATED DERMIS) Left 02/02/2016   Procedure: IMMEDIATE LEFT BREAST RECONSTRUCTION WITH PLACEMENT OF TISSUE EXPANDER AND FLEX HD (ACELLULAR  HYDRATED DERMIS);  Surgeon: [name], DO;  Location: [place];  Service: Plastics;  Laterality: Left;  . BREAST REDUCTION SURGERY Right 03/28/2017   Procedure: RIGHT MAMMARY REDUCTION  (BREAST);  Surgeon: [name], DO;  Location: [place];  Service: Plastics;  Laterality: Right;  . BREAST SURGERY Left 02/02/16   mastectomy with reconstruction  . c-section  1991  . COLON RESECTION SIGMOID  12/02/2012   [facility]   . HERNIA REPAIR  2007  . LAPAROSCOPIC ENDOMETRIOSIS FULGURATION  1984, 1989  . MASTOPEXY Right 03/28/2017   Procedure: MASTOPEXY;  Surgeon: [name], DO;  Location: [place];  Service: Plastics;  Laterality: Right;  . Port- a- cath placement  07/2015  . PORT-A-CATH REMOVAL Right 02/02/2016   Procedure: REMOVAL PORT-A-CATH;  Surgeon: [name], MD;  Location: [place];  Service: General;  Laterality: Right;  . RADIOACTIVE SEED GUIDED AXILLARY SENTINEL LYMPH NODE Left 02/02/2016   Procedure: LEFT BREAST MASTECTOMY AND RADIOACTIVE SEED GUIDED LEFT SENTINEL LYMPH NODE BIOPSY ;  Surgeon: [name], MD;  Location: [place];  Service: General;  Laterality: Left;  . REMOVAL OF TISSUE EXPANDER AND PLACEMENT OF IMPLANT Left 03/28/2017   Procedure: REMOVAL OF TISSUE EXPANDER AND PLACEMENT OF IMPLANT;  Surgeon: Audelia Hives  S, DO;  Location: [place];  Service: Plastics;  Laterality: Left;    There were no vitals filed for this visit.  Subjective Assessment - 08/28/17 1108    Subjective  Overall I'm feeling good.  I can walk down the stairs now without feeling like I'm going to fall.  My back still hurst and there is still fullness and tighntes in her left axilla and chest.  Her left arm swelling has been manageable with occasion use of compression She is going to assessed for vertigo next week               [facility] PT Assessment - 08/28/17 0001       Assessment   Medical Diagnosis  breast cancer     Referring Provider  Dr. [name]     Onset Date/Surgical Date  07/13/15    Hand Dominance  Right      Precautions   Precaution Comments  multiple diffuse area of bony metastasis,       Prior Function   Level of Independence  Independent      Observation/Other Assessments   Skin Integrity  left chest incision is healed and skin looks improved as pt has been applying gel to skin.       Sit to Stand   Comments  12 in 30 seconds       AROM   Left Shoulder Flexion  140 Degrees measured in supine, somedays are better, today is a good day      Palpation   Palpation comment  very tight at left chest with only about 5% skin excursion across scar.  Painful swelling in left axilla        Standardized Balance Assessment   Standardized Balance Assessment  -- 5 meter gait velocity is 3.2 seconds or 1.5 m/s      Berg Balance Test   Sit to Stand  Able to stand without using hands and stabilize independently    Standing Unsupported  Able to stand safely 2 minutes    Sitting with Back Unsupported but Feet Supported on Floor or Stool  Able to sit safely and securely 2 minutes    Stand to Sit  Sits safely with minimal use of hands    Transfers  Able to transfer safely, minor use of hands    Standing Unsupported with Eyes Closed  Able to stand 10 seconds safely    Standing Ubsupported with Feet Together  Able to place feet together independently and stand 1 minute safely    From Standing, Reach Forward with Outstretched Arm  Can reach forward >12 cm safely (5")    From Standing Position, Pick up Object from Floor  Able to pick up shoe safely and easily    From Standing Position, Turn to Look Behind Over each Shoulder  Looks behind one side only/other side shows less weight shift    Turn 360 Degrees  Able to turn 360 degrees safely but slowly pt gets dizzy with turning     Standing Unsupported, Alternately Place Feet on Step/Stool  Able to stand  independently and safely and complete 8 steps in 20 seconds    Standing Unsupported, One Foot in Front  Able to plae foot ahead of the other independently and hold 30 seconds    Standing on One Leg  Able to lift leg independently and hold equal to or more than 3 seconds    Total Score  49    Berg comment:  pt needs to keep head steady during tasks or she gets dizzy       Timed Up and Go Test   Normal TUG (seconds)  8.31                  [facility] Adult PT Treatment/Exercise - 08/28/17 0001      Manual Therapy   Myofascial Release  briefly to scar at left chest     Manual Lymphatic Drainage (MLD)  briefly to left chest and axilla                PT Short Term Goals - 08/28/17 1314      PT SHORT TERM GOAL #1   Title  Pt will report that she has had 25% improvement in pain in left chest and axilla so that she can perform her kitchen tasks easier.     Time  4    Period  Weeks    Status  New      PT SHORT TERM GOAL #2   Title  Pt will report she is doing her arm range of motion exercises 5 times a week     Time  4    Period  Weeks    Status  New      Short Term Clinic Goals - 08/28/17 1118      CC Short Term Goal  #1   Title  Pt will report she knows strategies for good body mechanics to decrease low back pain     Status  Achieved      CC Short Term Goal  #2   Title  Pt will have a deacrease in circumference 15 cm prox to ulnar styloid of left forearm by 1 cm     Baseline  25 cm , on 05/16/2017 it is 23.5    Status  Achieved      CC Short Term Goal  #3   Title  Pt will report she knows how to manage left UE lymphedema with use of compression, elevation and exercise, self manual lymph drainage and possibly Flexitouch     Baseline  Pt has day and nighttime garments and Flexitouch     Status  Achieved       PT Long Term Goals - 08/28/17 1122      PT LONG TERM GOAL #1   Title  Pt wants to improve her balance so that she feels more confident with her gait and  not be fearful of tripping     Time  8    Period  Weeks    Status  New      PT LONG TERM GOAL #2   Title  Pt will have 150 degrees of shoulder flexion in supine indicating so that she will have greater ease in reaching activites in her daily life     Time  [facility] Goals - 08/28/17 1119      CC Long Term Goal  #1   Title  Patient will report  that she can manage back pain  so she can perform daily activities with greater ease    Status  Achieved      CC Long Term Goal  #2   Title  Pt will  have given the TENS unit a trial to see if it will help with back pain     Baseline  has not needed to use TENS     Status  Deferred      CC Long Term Goal  #3   Title  Pt will be independent in core and  LE strengthening exercises       CC Long Term Goal  #4   Title  Pt will report low back pain is decreased to a 4/10 so that she can perform activities of daily living easier     Baseline  It depends on the day , but pt needs to take breaks and knows how to manage her pain, she feels this goal is achieved     Status  Achieved         Plan - 08/28/17 1317    Clinical Impression Statement  Jaine has made improvement in her function strength and noticed that she is able to do things better at home such as going down the steps.  She still feels unbalanced and her exercises session lately have been limited by dizziness and occasional nausea.  She will be going for a vestibular evaluation next week.  She continues to have pain in left chest and axilla with tight aderence of skin to chest wall and tender fullness in axilla.  She received relief from the symptoms of pain with manual lymph drainage and kinesiotape and wants to continue with that as well as exercise to increase her balance to remain as functional as she can.  Will coordinate with vestibular therapists for her continued PT  Amrie continues to have good and bad days.  She did well with  physical performance tests today and she feels today is a "good day"  She hopes to keep her performance at this level and improve her confidence in mobilty as she hopes her dizziness improves.  Due to multiple areas of bony mets, fall prevention is important.     Rehab Potential  Fair    Clinical Impairments Affecting Rehab Potential  mulitple areas of diffuse bony metastasis, chemotherapy with neuropathy , radiation to back  and chest,    PT Frequency  2x / week    PT Duration  8 weeks    PT Treatment/Interventions  ADLs/Self Care Home Management;Manual lymph drainage;Compression bandaging;Scar mobilization;Passive range of motion;Patient/family education;Taping;Manual techniques;Therapeutic exercise;Therapeutic activities;Orthotic Fit/Training;DME Instruction;Electrical Stimulation;Moist Heat    PT Next Visit Plan  coordiate with Misty Stanley for treatments for vestibular treatment and lymphedema/strengthening.  Focus on myofascial work to left chest and manual lymph draiange to left axilla contine with kinesiotape prn     Consulted and Agree with Plan of Care  Patient       Patient will benefit from skilled therapeutic intervention in order to improve the following deficits and impairments:  Decreased range of motion, Impaired UE functional use, Decreased activity tolerance, Decreased knowledge of precautions, Decreased skin integrity, Impaired perceived functional ability, Pain, Decreased knowledge of use of DME, Increased edema, Postural dysfunction, Decreased strength, Impaired sensation, Decreased scar mobility, Decreased mobility, Increased muscle spasms, Dizziness, Increased fascial restricitons, Decreased balance  Visit Diagnosis: Abnormal posture  Localized swelling, mass and lump, neck  Chronic left shoulder pain  Low back pain with sciatica, sciatica laterality unspecified, unspecified back pain laterality, unspecified chronicity  Stiffness of left shoulder joint     Problem  List Patient Active Problem List   Diagnosis Date Noted  . Counseling regarding goals of care 04/16/2017  . Bone metastases ([place]) 02/28/2017  . Uncontrolled pain 02/28/2017  . Osteopenia determined by x-ray 09/20/2016  .  Primary malignant neoplasm of left breast with stage 2 nodal metastasis per American Joint Committee on Cancer 7th edition (N2) ([place]) 02/14/2016  . Chemotherapy-induced neuropathy ([place]) 12/30/2015  . Insomnia 09/01/2015  . Malignant neoplasm of upper-outer quadrant of left breast in female, estrogen receptor positive ([place]) 08/08/2015  Donato Heinz. Owens Shark PT    Norwood Levo 08/28/2017, 1:26 PM  [place], [place], [address] Phone: [phone]   Fax:  [phone]  Name: DONESHA WALLANDER MRN: [id number] Date of Birth: [birth year]

## 2017-08-29 ENCOUNTER — Ambulatory Visit (HOSPITAL_COMMUNITY): Payer: 59

## 2017-08-29 ENCOUNTER — Other Ambulatory Visit: Payer: Self-pay | Admitting: Oncology

## 2017-08-29 DIAGNOSIS — C50412 Malignant neoplasm of upper-outer quadrant of left female breast: Secondary | ICD-10-CM

## 2017-08-29 DIAGNOSIS — Z170 Estrogen receptor positive status [ER+]: Principal | ICD-10-CM

## 2017-08-30 ENCOUNTER — Other Ambulatory Visit: Payer: Self-pay | Admitting: *Deleted

## 2017-08-30 ENCOUNTER — Telehealth: Payer: Self-pay | Admitting: *Deleted

## 2017-08-30 DIAGNOSIS — C50412 Malignant neoplasm of upper-outer quadrant of left female breast: Secondary | ICD-10-CM

## 2017-08-30 DIAGNOSIS — Z170 Estrogen receptor positive status [ER+]: Principal | ICD-10-CM

## 2017-08-30 MED ORDER — ABEMACICLIB 100 MG PO TABS: ORAL_TABLET | ORAL | 3 refills | Status: DC

## 2017-08-30 MED ORDER — ABEMACICLIB 100 MG PO TABS: 100.0000 mg | ORAL_TABLET | Freq: Two times a day (BID) | ORAL | 3 refills | Status: DC

## 2017-08-30 NOTE — Telephone Encounter (Signed)
Refill printed and faxed to [facility]

## 2017-09-02 ENCOUNTER — Other Ambulatory Visit: Payer: Self-pay | Admitting: *Deleted

## 2017-09-03 ENCOUNTER — Other Ambulatory Visit: Payer: Self-pay | Admitting: *Deleted

## 2017-09-03 ENCOUNTER — Other Ambulatory Visit: Payer: Self-pay | Admitting: Oncology

## 2017-09-03 DIAGNOSIS — C50412 Malignant neoplasm of upper-outer quadrant of left female breast: Secondary | ICD-10-CM

## 2017-09-03 DIAGNOSIS — Z170 Estrogen receptor positive status [ER+]: Principal | ICD-10-CM

## 2017-09-03 MED ORDER — ABEMACICLIB 100 MG PO TABS: 100.0000 mg | ORAL_TABLET | Freq: Two times a day (BID) | ORAL | 3 refills | Status: DC

## 2017-09-04 ENCOUNTER — Encounter: Payer: Self-pay | Admitting: Physical Therapy

## 2017-09-04 ENCOUNTER — Ambulatory Visit: Payer: 59 | Admitting: Physical Therapy

## 2017-09-04 ENCOUNTER — Other Ambulatory Visit: Payer: Self-pay

## 2017-09-04 DIAGNOSIS — R296 Repeated falls: Secondary | ICD-10-CM

## 2017-09-04 DIAGNOSIS — R208 Other disturbances of skin sensation: Secondary | ICD-10-CM

## 2017-09-04 DIAGNOSIS — Z170 Estrogen receptor positive status [ER+]: Principal | ICD-10-CM

## 2017-09-04 DIAGNOSIS — R293 Abnormal posture: Secondary | ICD-10-CM | POA: Diagnosis not present

## 2017-09-04 DIAGNOSIS — R262 Difficulty in walking, not elsewhere classified: Secondary | ICD-10-CM

## 2017-09-04 DIAGNOSIS — R42 Dizziness and giddiness: Secondary | ICD-10-CM

## 2017-09-04 DIAGNOSIS — R2681 Unsteadiness on feet: Secondary | ICD-10-CM

## 2017-09-04 DIAGNOSIS — C50412 Malignant neoplasm of upper-outer quadrant of left female breast: Secondary | ICD-10-CM

## 2017-09-04 MED ORDER — OXYCODONE HCL 15 MG PO TABS: 15.0000 mg | ORAL_TABLET | ORAL | 0 refills | Status: DC | PRN

## 2017-09-04 NOTE — Therapy (Signed)
[facility] [address] [place], [place], [address] Phone: [phone]   Fax:  [phone]  Physical Therapy Vestibular Evaluation  Patient Details  Name: Alison [name] MRN: [id number] Date of Birth: [birth year] Referring Provider: Gardenia Phlegm, NP   Encounter Date: 09/04/2017  PT End of Session - 09/04/17 2050    Visit Number  2 in 2019    Number of Visits  26 17 + 9 for vestibular treatment    Date for PT Re-Evaluation  10/26/17 vestibular re-eval 11/03/2017    Authorization Type  [employer]    PT Start Time  1451    PT Stop Time  1540    PT Time Calculation (min)  49 min    Activity Tolerance  Patient tolerated treatment well    Behavior During Therapy  [facility] for tasks assessed/performed       Past Medical History:  Diagnosis Date  . Anxiety   . Asthma     seasonal asthma  . Breast cancer ([place])    metastatic breast cancer  . Complication of anesthesia 2007   aspiration pneumonia post hysterectomy.  Blood pressure would drop low- but no problems 01/2016- surgery  . History of blood transfusion   . History of mitral valve prolapse 1988   with palpitations  . History of radiation therapy 04/02/16- 05/21/16   Left Chest Wall, SCV, Axilla, IM nodes  . Hypertension    no meds  . Neuropathy    with chemo  . Osteopenia determined by x-ray   . Pneumonia    1980's and 1990's  . Raynaud's disease   . 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 Skin cancer 03/12/2014   basal- back    Past Surgical History:  Procedure Laterality Date  . ABDOMINAL HYSTERECTOMY  2007   with BSO  . APPENDECTOMY  2007  . AXILLARY LYMPH NODE DISSECTION Left 02/14/2016   Procedure: LEFT AXILLARY LYMPH NODE DISSECTION;  Surgeon: [name], MD;  Location: [facility];  Service: General;  Laterality: Left;  . BREAST RECONSTRUCTION WITH PLACEMENT OF TISSUE EXPANDER AND FLEX HD (ACELLULAR HYDRATED DERMIS)  Left 02/02/2016   Procedure: IMMEDIATE LEFT BREAST RECONSTRUCTION WITH PLACEMENT OF TISSUE EXPANDER AND FLEX HD (ACELLULAR HYDRATED DERMIS);  Surgeon: [name], DO;  Location: [facility];  Service: Plastics;  Laterality: Left;  . BREAST REDUCTION SURGERY Right 03/28/2017   Procedure: RIGHT MAMMARY REDUCTION  (BREAST);  Surgeon: [name], DO;  Location: [facility];  Service: Plastics;  Laterality: Right;  . BREAST SURGERY Left 02/02/16   mastectomy with reconstruction  . c-section  1991  . COLON RESECTION SIGMOID  12/02/2012   [facility]   . HERNIA REPAIR  2007  . LAPAROSCOPIC ENDOMETRIOSIS FULGURATION  1984, 1989  . MASTOPEXY Right 03/28/2017   Procedure: MASTOPEXY;  Surgeon: [name], DO;  Location: [facility];  Service: Plastics;  Laterality: Right;  . Port- a- cath placement  07/2015  . PORT-A-CATH REMOVAL Right 02/02/2016   Procedure: REMOVAL PORT-A-CATH;  Surgeon: [name], MD;  Location: [facility];  Service: General;  Laterality: Right;  . RADIOACTIVE SEED GUIDED AXILLARY SENTINEL LYMPH NODE Left 02/02/2016   Procedure: LEFT BREAST MASTECTOMY AND RADIOACTIVE SEED GUIDED LEFT SENTINEL LYMPH NODE BIOPSY ;  Surgeon: [name], MD;  Location: [facility];  Service: General;  Laterality: Left;  . REMOVAL OF TISSUE EXPANDER AND PLACEMENT  OF IMPLANT Left 03/28/2017   Procedure: REMOVAL OF TISSUE EXPANDER AND PLACEMENT OF IMPLANT;  Surgeon: [name], DO;  Location: [facility];  Service: Plastics;  Laterality: Left;    There were no vitals filed for this visit.   Subjective Assessment - 09/04/17 1457    Subjective  Pt presents to outpatient PT for evaluation of dizziness and imbalance.  Pt reports h/o tinnitus since she was a young girl that started spontaeously; reports now the tinnitus is constant and is louder. Dizziness started a month ago; she first noticed it when  turning quickly and with eyes closed standing on an unstable surface.  Reports new fullness in R ear but denies other changes in hearing.  Reports new onset of frontal headaches and nausea with dizziness.  No changes in vision.  Pt reports neuropathy in hands and feet from chemotherapy.    Pertinent History  Breast cancer diagnosed November 2016 She had chemotherapy. but did have some neuropathy so it had to be stopped. On June 8, she had a mastectomy with immedicate expander placement and with first set of lymphnodes removed, On June 20 she went for complete axillary lymph node dissection. She says she has had always been "full necked" but has neck and upper body fullness since the cancer was diagnosed. She reports she has osteopenia in low back and has had a fracture that was discovered in 2014 when she had a Dexa scan. Pt has had metastasis to the bones of the spine and recent  bilateral breast surgery for a reduction on the right and expander removal on the left with implant placement and removal     Patient Stated Goals  to improve her dizziness and prevent future falls    Currently in Pain?  No/denies         [facility] PT Assessment - 09/04/17 1507      Assessment   Medical Diagnosis  Dizziness and imbalance    Referring Provider  Gardenia Phlegm, NP    Onset Date/Surgical Date  08/14/17    Hand Dominance  Right    Prior Therapy  yes-for lymphedema      Precautions   Precautions  Other (comment)    Precaution Comments  LUE RESTRICTED, multiple diffuse area of bony metastasis, h/o breast cancer with chemotherapy, peripheral neuropathy      Balance Screen   Has the patient fallen in the past 6 months  Yes tripped over dog, tripped on stairs, one time dizzy    How many times?  3    Has the patient had a decrease in activity level because of a fear of falling?   No    Is the patient reluctant to leave their home because of a fear of falling?   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 [employer] ([employer])   73% (27% limited; predicted 15% limitation by D/C)      Sensation   Light Touch  Impaired Detail    Light Touch Impaired Details  Impaired RLE;Impaired LUE;Impaired RUE;Impaired LLE    Proprioception  Impaired by gross assessment         Vestibular Assessment - 09/04/17 1511      Vestibular Assessment   General Observation  no evidence of imbalance during ambulation in the clinic      Symptom Behavior   Type of Dizziness  Spinning    Frequency of Dizziness  3x/week    Duration of Dizziness  seconds    Aggravating Factors  Turning body quickly;Turning head quickly;Comment;Rolling to right eyes closed on compliant surface    Relieving Factors  Head stationary      Occulomotor Exam   Occulomotor Alignment  Normal    Spontaneous  Absent    Gaze-induced  Absent    Smooth Pursuits  Saccades R eye    Saccades  Intact    Comment  Convergence impaired      Vestibulo-Occular Reflex   VOR to Slow Head Movement  Comment intact but pt reports nausea    Comment  HIT: mild refixation saccades to L and R due to pt startling, reports some nausea      Visual Acuity   Static  5    Dynamic  4      Positional Testing   Dix-Hallpike  Dix-Hallpike Right;Dix-Hallpike Left    Horizontal Canal Testing  Horizontal Canal Right;Horizontal Canal Left      Dix-Hallpike Right   Dix-Hallpike Right Duration  0    Dix-Hallpike Right Symptoms  No nystagmus      Dix-Hallpike Left    Dix-Hallpike Left Duration  0    Dix-Hallpike Left Symptoms  No nystagmus      Horizontal Canal Right   Horizontal Canal Right Duration  0    Horizontal Canal Right Symptoms  Normal      Horizontal Canal Left   Horizontal Canal Left Duration  0    Horizontal Canal Left Symptoms  Normal         Objective measurements completed on examination: See above findings.              PT Education - 09/04/17 2050    Education provided  Yes    Education Details  Clinical findings, PT POC and goals    Person(s) Educated  Patient    Methods  Explanation    Comprehension  Verbalized understanding       PT Short Term Goals - 09/04/17 2112      PT SHORT TERM GOAL #3   Title  Vestibular goal: pt will participate in further assessment of gait with FGA and gait velocity    Time  4    Period  Weeks    Status  New    Target Date  10/04/17      PT SHORT TERM GOAL #4   Title  Vestibular goal: pt will participate in motion sensitivity and SOT testing with habituation HEP to be initiated    Time  4    Period  Weeks    Status  New    Target Date  10/04/17       PT Long Term Goals - 09/04/17 2115      PT LONG TERM GOAL #3   Title  VESTIBULAR GOALS: Pt will demonstrate independence with vestibular and  balance HEP    Time  8    Period  Weeks    Status  New    Target Date  11/03/17      PT LONG TERM GOAL #4   Title  Pt will report >/= 10 point improvement in overall function (as reported on FOTO) by D/C    Baseline  73% (27% Limited)    Time  8    Period  Weeks    Status  New    Target Date  11/03/17      PT LONG TERM GOAL #5   Title  Pt will report decrease in symptom severity to </= 1/5 on MSQ    Time  8    Period  Weeks    Status  New    Target Date  11/03/17      Additional Long Term Goals   Additional Long Term Goals  Yes      PT LONG TERM GOAL #6   Title  Pt will improve FGA by 8 points to decrease falls risk    Time  8    Period  Weeks    Status  New     Target Date  11/03/17      PT LONG TERM GOAL #7   Title  Pt will improve gait velocity to >3.6 ft/sec     Time  8    Period  Weeks    Status  New    Target Date  11/03/17      PT LONG TERM GOAL #8   Title  Pt will improve SOT composite score by 10 points    Time  8    Period  Weeks    Status  New    Target Date  11/03/17           Plan - 09/04/17 2053    Clinical Impression Statement  Pt is a 57 year old female referred to Neuro OPPT for evaluation of dizziness and imbalance.  Pt's PMH is significant for the following: breast cancer s/p chemotherapy, radiation therapy and L axillary lymph node dissection, mastectomy, lymphedema, HTN, neuropathy, osteopenia, PNA, Raynaud's disease, skin cancer and metastasis to multiple bony areas. The following deficits were noted during pt's exam: dizziness and disequilibrium, motion sensitivity, impaired sensation, impaired balance, and gait.  Pt has experienced 3 falls over the past 6 months during ambulation and stair negotiation due to dizziness and is at increased falls risk.  Pt was negative for positional vertigo.  Pt would benefit from skilled PT to address these impairments and functional limitations to maximize functional mobility independence and reduce falls risk.    History and Personal Factors relevant to plan of care:  breast cancer s/p chemotherapy, radiation therapy and L axillary lymph node dissection, mastectomy, lymphedema, HTN, neuropathy, osteopenia, PNA, Raynaud's disease, skin cancer and metastasis to multiple bony areas, multiple falls    Clinical Presentation  Evolving    Clinical Presentation due to:  breast cancer s/p chemotherapy, radiation therapy and L axillary lymph node dissection, mastectomy, lymphedema, HTN, neuropathy, osteopenia, PNA, Raynaud's disease, skin cancer and metastasis to multiple bony areas, multiple falls    Clinical Decision Making  Moderate    Rehab Potential  Fair    Clinical Impairments Affecting  Rehab Potential  mulitple areas of diffuse bony metastasis, chemotherapy with neuropathy , radiation to back  and chest,    PT Frequency  Other (comment) 1-2x/week at cancer rehab; 1x/wk vestibular    PT  Duration  8 weeks    PT Treatment/Interventions  ADLs/Self Care Home Management;Manual lymph drainage;Compression bandaging;Scar mobilization;Passive range of motion;Patient/family education;Taping;Manual techniques;Therapeutic exercise;Therapeutic activities;Orthotic Fit/Training;DME Instruction;Electrical Stimulation;Moist Heat;Gait training;Stair training;Functional mobility training;Balance training;Neuromuscular re-education;Vestibular    PT Next Visit Plan  Focus on myofascial work to left chest and manual lymph draiange to left axilla contine with kinesiotape prn; VESTIBULAR PLAN: assess FGA and gait velocity-revise goals. Complete MSQ and SOT. Initiate habituation HEP    Consulted and Agree with Plan of Care  Patient       Patient will benefit from skilled therapeutic intervention in order to improve the following deficits and impairments:  Decreased range of motion, Impaired UE functional use, Decreased activity tolerance, Decreased knowledge of precautions, Decreased skin integrity, Impaired perceived functional ability, Pain, Decreased knowledge of use of DME, Increased edema, Postural dysfunction, Decreased strength, Impaired sensation, Decreased scar mobility, Decreased mobility, Increased muscle spasms, Dizziness, Increased fascial restricitons, Decreased balance, Difficulty walking  Visit Diagnosis: Dizziness and giddiness  Unsteadiness on feet  Repeated falls  Difficulty in walking, not elsewhere classified  Other disturbances of skin sensation     Problem List Patient Active Problem List   Diagnosis Date Noted  . Counseling regarding goals of care 04/16/2017  . Bone metastases ([place]) 02/28/2017  . Uncontrolled pain 02/28/2017  . Osteopenia determined by x-ray 09/20/2016   . Primary malignant neoplasm of left breast with stage 2 nodal metastasis per American Joint Committee on Cancer 7th edition (N2) ([place]) 02/14/2016  . Chemotherapy-induced neuropathy ([place]) 12/30/2015  . Insomnia 09/01/2015  . Malignant neoplasm of upper-outer quadrant of left breast in female, estrogen receptor positive ([place]) 08/08/2015    Rico Junker, PT, DPT 09/04/17    9:24 PM    [facility] [address] [place], [place], [address] Phone: [phone]   Fax:  [phone]  Name: Alison [name] MRN: [id number] Date of Birth: 10-02-1960

## 2017-09-05 ENCOUNTER — Other Ambulatory Visit: Payer: Self-pay | Admitting: Oncology

## 2017-09-05 ENCOUNTER — Ambulatory Visit (HOSPITAL_COMMUNITY)
Admission: RE | Admit: 2017-09-05 | Discharge: 2017-09-05 | Disposition: A | Discharge status: Home / Self Care | Payer: 59 | Source: Ambulatory Visit | Attending: Oncology | Admitting: Oncology

## 2017-09-05 DIAGNOSIS — C50412 Malignant neoplasm of upper-outer quadrant of left female breast: Secondary | ICD-10-CM | POA: Diagnosis present

## 2017-09-05 DIAGNOSIS — C50912 Malignant neoplasm of unspecified site of left female breast: Secondary | ICD-10-CM | POA: Insufficient documentation

## 2017-09-05 DIAGNOSIS — C779 Secondary and unspecified malignant neoplasm of lymph node, unspecified: Secondary | ICD-10-CM | POA: Diagnosis not present

## 2017-09-05 DIAGNOSIS — C7951 Secondary malignant neoplasm of bone: Secondary | ICD-10-CM | POA: Insufficient documentation

## 2017-09-05 DIAGNOSIS — Z170 Estrogen receptor positive status [ER+]: Secondary | ICD-10-CM | POA: Insufficient documentation

## 2017-09-05 LAB — GLUCOSE, CAPILLARY: Glucose-Capillary: 72 mg/dL (ref 65–99)

## 2017-09-05 MED ORDER — SODIUM FLUORIDE F-18: Freq: Once | INTRAVENOUS | Status: DC

## 2017-09-05 MED ORDER — FLUDEOXYGLUCOSE F - 18 (FDG) INJECTION
8.5000 | Freq: Once | INTRAVENOUS | Status: AC | PRN
  Administered 2017-09-05: 8.5 via INTRAVENOUS

## 2017-09-05 NOTE — Progress Notes (Signed)
I called the patient and left her voicemail with the results of her PET scan.  These are generally favorable, with no visceral disease and no progression in the bones.

## 2017-09-06 ENCOUNTER — Telehealth: Payer: Self-pay | Admitting: Oncology

## 2017-09-06 NOTE — Telephone Encounter (Signed)
Faxed office note to [facility] health [phone]

## 2017-09-10 ENCOUNTER — Ambulatory Visit: Payer: 59 | Admitting: Physical Therapy

## 2017-09-10 ENCOUNTER — Encounter: Payer: Self-pay | Admitting: Physical Therapy

## 2017-09-10 DIAGNOSIS — R262 Difficulty in walking, not elsewhere classified: Secondary | ICD-10-CM

## 2017-09-10 DIAGNOSIS — R221 Localized swelling, mass and lump, neck: Secondary | ICD-10-CM

## 2017-09-10 DIAGNOSIS — R293 Abnormal posture: Secondary | ICD-10-CM

## 2017-09-10 DIAGNOSIS — R2681 Unsteadiness on feet: Secondary | ICD-10-CM

## 2017-09-10 DIAGNOSIS — R208 Other disturbances of skin sensation: Secondary | ICD-10-CM

## 2017-09-10 DIAGNOSIS — R296 Repeated falls: Secondary | ICD-10-CM

## 2017-09-10 NOTE — Progress Notes (Signed)
[place]  [phone] [phone]   ID: Alison [name] DOB: Mar 12, 1961  MR#: [id number]  JJH#:[id number]  Patient Care Team: Alison Pao, MD as PCP - General (Internal Medicine) Alison Luna, MD as Consulting Physician (General Surgery) [name], Alison Dad, MD as Consulting Physician (Oncology) Alison Gell, MD as Consulting Physician (Obstetrics and Gynecology) Alison Sine, MD as Consulting Physician (Dermatology) Alison Norway, RN as Registered Nurse (Oncology) Alison Gibson, MD as Attending Physician (Radiation Oncology) PCP: Alison Pao, MD OTHER MD:  CHIEF COMPLAINT: Estrogen receptor positive breast cancer  CURRENT TREATMENT: fulvestrant, abemaciclib, denosumab/Xgeva  INTERVAL HISTORY: Alison [name] returns today for follow-up and treatment of her 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 She continues on anastrozole, with good tolerance. She notes that her hair is thinning. She notes that she takes [facility] [place] mushroom pills as an herbal supplement for her breast cancer.  She says this was suggested by the [facility] site.  She also continues on fulvestrant and Xgeva every 4 weeks. She tolerates this well.   She also takes abemaciclib two week on and two weeks off, with good tolerance. She denies having diarrhea.  In general she does not report any side effects from this except possibly mild nausea  Since her last visit, she completed a PET scan on 09/05/2017 showing: Widespread skeletal metastasis with no convincing evidence progression. Increased uniform marrow activity in the thoracic spine may represent anemia response. Decreased activity of metastatic lesions in lumbar spine related to radiation therapy. No evidence of breast cancer metastasis to the soft tissues or viscera. No evidence local recurrence in the LEFT breast or axilla.    REVIEW OF SYSTEMS: Alison [name] reports  that she went to [facility] over the holidays with her husband, but unfortunately, the weather wasn't nice. She notes that she is experiencing vertigo when she turns her head quickly. She starts getting dizzy, but the room doesn't spin. She notes that she always had ringing in her ears, but it has increased since having cancer. She notes that she hasoccasional nasuea, but she denies vomiting. She notes that it may be because of the neuropathy. She reports difficulty starting to urinate. She notes that she has to bear down and push. She reports mild lower back pain, which she continues to take long acting pain medications every 4 hours. She notes that she sits with a pillow to aid the pain. She notes that she usually wont feel pain for about 3 hours. She notes that she has trouble lifting and carrying things. She has accommodated her lifestyle for the pain. She notes that she has been walking more often lately with Alison [name]. She notes that she has a treadmill in the garage. She notes that her bowel movements are fine. She denies diarrhea, and she takes Senokot 3-4 times per day for constipation. She denies unusual headaches, visual changes, or vomiting. There has been no unusual cough, phlegm production, or pleurisy. She denies unexplained fatigue or unexplained weight loss, bleeding, rash, or fever. A detailed review of systems was otherwise stable.      BREAST CANCER HISTORY: From the original intake note:  Alison [name] had routine screening mammography with tomography at [facility] February 20 11/14/2014 showing a possible mass in the right breast. Diagnostic right mammography with right breast ultrasonography 11/01/2014 found the breast density to be category C. In the upper outer quadrant of the right breast there was a partially obscured mass which on  physical exam was palpable as an area of thickening at the 10:00 position 8 cm from the nipple. Ultrasound showed multiple cysts in the upper outer right  breast corresponding to the mass in question.  In November 2016 the patient felt a change in the upper left breast and brought it to her primary care physician's attention. Diagnostic left mammography with tomosynthesis and left breast ultrasonography at the [facility] December 01/14/2015 found trabecular thickening throughout the left breast with skin thickening, but no circumscribed mass or suspicious calcifications. A rounded mass in the left axilla measured 6 mm. There was an additional mildly prominent lymph node in the left axilla which was what the patient had been palpating. Ultrasonography showed ill-defined swelling throughout the inner left breast with overlying skin thickening.. At the 2:00 position 4 cm from the nipple there was an irregular hypoechoic mass measuring 2.3 cm. A second mass at the 1:00 area 3 cm from the nipple measured 1 cm. The distance between these 2 masses was 1.8 cm. There was also a round hypoechoic left axillary mass measuring 0.6 cm. Overall the was an area of 3.7 cm of mixed echogenicity corresponding to the area of palpable soft tissue swelling.   On 08/04/2015 the patient underwent biopsy of the mass at the 2:00 axis 4 cm from the nipple and a second biopsy was performed of the hypoechoic mass at the 1:00 position 3 cm from the nipple. Biopsy of the suspicious nodule in the lower left axilla was attempted but could not be completed as the mass became obscured after administration of lidocaine.  The pathology from both left breast biopsies 08/04/2015 ([id number]) showed invasive ductal carcinoma, grade 3. The prognostic profile obtained from the larger tumor showed estrogen receptor to be strongly positive at 95%, progesterone receptor positive at 30%, with an MIB-1 of 12%, and no HER-2 amplification, the signals ratio being 1.22 and the number per cell 2.20.  On 08/09/2015 the patient underwent biopsy of this suspicious left axillary lymph node noted above, and  this showed ([id number]) metastatic carcinoma with extracapsular extension.  The patient's subsequent history is as detailed below    PAST MEDICAL HISTORY: Past Medical History:  Diagnosis Date  . Anxiety   . Asthma     seasonal asthma  . Breast cancer ([place])    metastatic breast cancer  . Complication of anesthesia 2007   aspiration pneumonia post hysterectomy.  Blood pressure would drop low- but no problems 01/2016- surgery  . History of blood transfusion   . History of mitral valve prolapse 1988   with palpitations  . History of radiation therapy 04/02/16- 05/21/16   Left Chest Wall, SCV, Axilla, IM nodes  . Hypertension    no meds  . Neuropathy    with chemo  . Osteopenia determined by x-ray   . Pneumonia    1980's and 1990's  . Raynaud's disease   . 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 Skin cancer 03/12/2014   basal- back    PAST SURGICAL HISTORY: Past Surgical History:  Procedure Laterality Date  . ABDOMINAL HYSTERECTOMY  2007   with BSO  . APPENDECTOMY  2007  . AXILLARY LYMPH NODE DISSECTION Left 02/14/2016   Procedure: LEFT AXILLARY LYMPH NODE DISSECTION;  Surgeon: [name], MD;  Location: [place];  Service: General;  Laterality: Left;  . BREAST RECONSTRUCTION WITH PLACEMENT OF TISSUE EXPANDER AND FLEX HD (ACELLULAR HYDRATED DERMIS) Left 02/02/2016   Procedure: IMMEDIATE  LEFT BREAST RECONSTRUCTION WITH PLACEMENT OF TISSUE EXPANDER AND FLEX HD (ACELLULAR HYDRATED DERMIS);  Surgeon: [name], DO;  Location: [place];  Service: Plastics;  Laterality: Left;  . BREAST REDUCTION SURGERY Right 03/28/2017   Procedure: RIGHT MAMMARY REDUCTION  (BREAST);  Surgeon: [name], DO;  Location: [place];  Service: Plastics;  Laterality: Right;  . BREAST SURGERY Left 02/02/16   mastectomy with reconstruction  . c-section  1991  . COLON RESECTION SIGMOID  12/02/2012   [facility]   . HERNIA REPAIR   2007  . LAPAROSCOPIC ENDOMETRIOSIS FULGURATION  1984, 1989  . MASTOPEXY Right 03/28/2017   Procedure: MASTOPEXY;  Surgeon: [name], DO;  Location: [place];  Service: Plastics;  Laterality: Right;  . Port- a- cath placement  07/2015  . PORT-A-CATH REMOVAL Right 02/02/2016   Procedure: REMOVAL PORT-A-CATH;  Surgeon: [name], MD;  Location: [place];  Service: General;  Laterality: Right;  . RADIOACTIVE SEED GUIDED AXILLARY SENTINEL LYMPH NODE Left 02/02/2016   Procedure: LEFT BREAST MASTECTOMY AND RADIOACTIVE SEED GUIDED LEFT SENTINEL LYMPH NODE BIOPSY ;  Surgeon: [name], MD;  Location: [place];  Service: General;  Laterality: Left;  . REMOVAL OF TISSUE EXPANDER AND PLACEMENT OF IMPLANT Left 03/28/2017   Procedure: REMOVAL OF TISSUE EXPANDER AND PLACEMENT OF IMPLANT;  Surgeon: [name], DO;  Location: [place];  Service: Plastics;  Laterality: Left;    FAMILY HISTORY Family History  Problem Relation Age of Onset  . Lung cancer Father    the patient's father died from lung cancer at the age of 28 in the setting of tobacco abuse. The patient's mother died at the age of 61 with pancreatic cancer. The patient had no siblings. There is no history of breast or ovarian cancer in the family.  GYNECOLOGIC HISTORY:  No LMP recorded. Patient has had a hysterectomy. Menarche age 66, first live birth age 44. The patient is GX P1. She is status post total hysterectomy with bilateral salpingo-oophorectomy. She has been on hormone replacement since that time and is tapering to off at the time of this dictation.   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 She used to work at [employer]. Her husband [name] works for [employer] division at a [employer] their son Alison [name] is currently at home.    ADVANCED DIRECTIVES: Not in place   HEALTH MAINTENANCE: Social History   Tobacco Use  .  Smoking status: Former Smoker    Packs/day: 1.00    Years: 10.00    Pack years: 10.00    Types: Cigarettes    Last attempt to quit: 11/25/2008    Years since quitting: 8.8  . Smokeless tobacco: Never Used  . Tobacco comment: on and off smoker never consistent  Substance Use Topics  . Alcohol use: Yes    Comment: social  . Drug use: No     Colonoscopy: April 2014/ Raynaldo Opitz [facility]  PAP: s/p hysterectomy  Bone density: August 2016/ osteopenia  Lipid panel:  Allergies  Allergen Reactions  . Sulfa Antibiotics Other (See Comments)    Blisters in mouth    Current Outpatient Medications  Medication Sig Dispense Refill  . UNABLE TO FIND [place]    . UNABLE TO FIND [place] mushroom    . UNABLE TO FIND [place] Grifolafrodre    . abemaciclib (VERZENIO) 100 MG tablet Take 1 tablet (100  mg total) by mouth 2 (two) times daily. Pt to take 2 weeks on then off x 2 weeks 30 tablet 3  . anastrozole (ARIMIDEX) 1 MG tablet TAKE ONE TABLET BY MOUTH ONCE DAILY 90 tablet 3  . Ascorbic Acid (VITAMIN C) 1000 MG tablet Take 1,000 mg by mouth 2 (two) times daily.    . cetirizine (ZYRTEC) 10 MG tablet Take 10 mg by mouth daily.    . Cholecalciferol (VITAMIN D3) 10000 units capsule Take 10,000 Units by mouth daily.     Marland Kitchen escitalopram (LEXAPRO) 20 MG tablet Take 1 tablet (20 mg total) by mouth daily. 90 tablet 4  . fluticasone (FLONASE) 50 MCG/ACT nasal spray Place 2 sprays into both nostrils daily. Reported on 11/18/2015    . gabapentin (NEURONTIN) 300 MG capsule Take 1 capsule (300 mg total) by mouth at bedtime as needed. Take 2 tablets in AM, 2 tablets mid-day, and 4 tablets at bedtime 90 capsule 4  . LORazepam (ATIVAN) 0.5 MG tablet Take 1 tablet (0.5 mg total) by mouth at bedtime. 30 tablet 3  . Multiple Vitamin (MULTIVITAMIN) tablet Take 1 tablet by mouth daily.    . ondansetron (ZOFRAN) 8 MG tablet Take 1 tablet (8 mg total) by mouth every 8 (eight) hours as needed for nausea or vomiting. 20 tablet 5    . oxyCODONE (OXYCONTIN) 80 mg 12 hr tablet Take 2 tablets (160 mg total) by mouth every 8 (eight) hours. 120 tablet 0  . oxyCODONE (ROXICODONE) 15 MG immediate release tablet Take 1-2 tablets (15-30 mg total) by mouth every 4 (four) hours as needed for pain. 90 tablet 0  . Probiotic Product (PROBIOTIC DAILY PO) Take 1 capsule by mouth daily. [place] prochlorperazine (COMPAZINE) 10 MG tablet Take 1 tablet (10 mg total) by mouth every 6 (six) hours as needed (Nausea or vomiting). 90 tablet 2  . traZODone (DESYREL) 50 MG tablet TAKE 3 TABLETS AT BEDTIME 90 tablet 2  . zolpidem (AMBIEN) 5 MG tablet Take 1 tablet (5 mg total) by mouth at bedtime as needed for sleep. for sleep 30 tablet 2   No current facility-administered medications for this visit.     OBJECTIVE: Middle-aged white woman who appears stated age  88:   09/11/17 1313  BP: (!) 143/83  Pulse: 100  Resp: 20  Temp: 98.7 F (37.1 C)  SpO2: 97%     Body mass index is 27.25 kg/m.    ECOG FS:2 - Symptomatic, <50% confined to bed   Sclerae unicteric, EOMs intact Oropharynx clear and moist No cervical or supraclavicular adenopathy Lungs no rales or rhonchi Heart regular rate and rhythm Abd soft, nontender, positive bowel sounds MSK no focal spinal tenderness, no upper extremity lymphedema Neuro: nonfocal, well oriented, appropriate affect Breasts: The right breast is unremarkable.  The left breast is status post mastectomy.  There is no evidence of chest wall recurrence.  Both axillae are benign.  LAB RESULTS:  CMP     Component Value Date/Time   NA 138 08/14/2017 1042   K 4.2 08/14/2017 1042   CL 110 02/15/2016 0330   CO2 26 08/14/2017 1042   GLUCOSE 129 08/14/2017 1042   BUN 8.3 08/14/2017 1042   CREATININE 0.8 08/14/2017 1042   CALCIUM 9.6 08/14/2017 1042   PROT 6.5 08/14/2017 1042   ALBUMIN 3.4 (L) 08/14/2017 1042   AST 63 (H) 08/14/2017 1042   ALT 53 08/14/2017  1042  ALKPHOS 84 08/14/2017 1042   BILITOT 0.44 08/14/2017 1042   GFRNONAA >60 02/15/2016 0330   GFRAA >60 02/15/2016 0330    INo results found for: SPEP, UPEP  Lab Results  Component Value Date   WBC 3.6 (L) 09/11/2017   NEUTROABS 2.2 09/11/2017   HGB 12.0 09/11/2017   HCT 35.6 09/11/2017   MCV 100.8 09/11/2017   PLT 160 09/11/2017      Chemistry      Component Value Date/Time   NA 138 08/14/2017 1042   K 4.2 08/14/2017 1042   CL 110 02/15/2016 0330   CO2 26 08/14/2017 1042   BUN 8.3 08/14/2017 1042   CREATININE 0.8 08/14/2017 1042      Component Value Date/Time   CALCIUM 9.6 08/14/2017 1042   ALKPHOS 84 08/14/2017 1042   AST 63 (H) 08/14/2017 1042   ALT 53 08/14/2017 1042   BILITOT 0.44 08/14/2017 1042       No results found for: LABCA2  No components found for: LABCA125  No results for input(s): INR in the last 168 hours.  Urinalysis    Component Value Date/Time   COLORURINE YELLOW 11/12/2015 [place] 11/12/2015 0837   LABSPEC 1.005 03/18/2017 1223   PHURINE 6.0 03/18/2017 1223   PHURINE 6.5 11/12/2015 0837   GLUCOSEU Negative 03/18/2017 1223   HGBUR Large 03/18/2017 1223   HGBUR MODERATE (A) 11/12/2015 0837   BILIRUBINUR Negative 03/18/2017 1223   KETONESUR Negative 03/18/2017 [place] 11/12/2015 0837   PROTEINUR Negative 03/18/2017 1223   PROTEINUR NEGATIVE 11/12/2015 0837   UROBILINOGEN 0.2 03/18/2017 1223   NITRITE Negative 03/18/2017 1223   NITRITE NEGATIVE 11/12/2015 0837   LEUKOCYTESUR Moderate 03/18/2017 1223    STUDIES: Nm Pet Image Restag (ps) Skull Base To Thigh  Result Date: 09/05/2017 CLINICAL DATA:  Subsequent treatment strategy for breast carcinoma. Bone metastasis. No visceral disease. EXAM: NUCLEAR MEDICINE PET SKULL BASE TO THIGH TECHNIQUE: 8.5 mCi F-18 FDG was injected intravenously. Full-ring PET imaging was performed from the skull base to thigh after the radiotracer. CT data was obtained  and used for attenuation correction and anatomic localization. FASTING BLOOD GLUCOSE:  Value: 72 mg/dl COMPARISON:  PET-CT 02/22/2017 FINDINGS: NECK No hypermetabolic lymph nodes in the neck. CHEST Post LEFT mastectomy anatomy. Mild postsurgical inflammation in the LEFT chest wall. No hypermetabolic axillary lymph nodes. Axillary nodal dissection clips in the LEFT axilla. No hypermetabolic supraclavicular infraclavicular nodes. No hypermetabolic or enlarged internal mammary nodes. There is linear thickening in the LEFT upper lobe unchanged from comparison exam and with mild metabolic activity. This is favored post radiation pulmonary parenchymal change. No suspicious nodularity. No hypermetabolic mediastinal lymph nodes or hilar nodes ABDOMEN/PELVIS No abnormal hypermetabolic activity within the liver, pancreas, adrenal glands, or spleen. No hypermetabolic lymph nodes in the abdomen or pelvis. Post hysterectomy. SKELETON Multiple skeletal metastasis again demonstrated. Photopenia through the lower lumbar spine consistent radiation therapy. Example lesions in the pelvis are not changed in metabolic activity. For example lesion in the LEFT iliac wing with SUV max equal 5.3 compared to 5.6. Lesion in the ribs and scapula unchanged. For example lesion the inferior aspect of the RIGHT scapula with SUV max equal 3.4 compared to 3.9. More confluent hypermetabolic marrow activity through the thoracic spine with individual lesions demonstrating similar metabolic activity. IMPRESSION: 1. Widespread skeletal metastasis with no convincing evidence progression. Increased uniform marrow activity in the thoracic spine may represent anemia response. 2. Decreased activity of metastatic  lesions in lumbar spine related to radiation therapy. 3. No evidence of breast cancer metastasis to the soft tissues or viscera. 4. No evidence local recurrence in the LEFT breast or axilla. Electronically Signed   By: Suzy Bouchard M.D.   On:  09/05/2017 16:45     PROTOCOLS:  participated in [place], ABC and PALLAS studies, currently off study   ASSESSMENT: 57 y.o. [place] woman status post biopsy of 2 separate masses in the left breast upper outer quadrant 08/04/2015, clinically mT2 N1, stage IIb/IIIa invasive ductal carcinoma, grade 3, the larger mass being estrogen and progesterone receptor positive, HER-2 negative, with an MIB-1 of 12%  (a) biopsy of a left axillary lymph node 08/09/2015 was positive, with extracapsular extension  (1) neoadjuvant chemotherapy started 08/25/2015 consisting of doxorubicin and cyclophosphamide in dose dense fashion 4, completed 10/06/2015,  followed by weekly paclitaxel 10 completed 12/23/2015  (a) final 2 planned cycles of weekly paclitaxel omitted because of neuropathy concerns  (2) status post left mastectomy with targeted axillary dissection 02/02/2016 for a ypT2 ypN2a, stage IIIa invasive ductal carcinoma, grade 2, with negative margins, repeat prognostic panel showing estrogen receptor positive, but progesterone receptor and HER-2 negative  (a) completion axillary lymph node dissection 02/14/2016 showed an additional 2 out of 8 lymph nodes to be involved (final count 6 out of 10 lymph nodes positive  (b) expander placement at the time of left mastectomy  (3) adjuvant capecitabine starting concurrently with radiation--discontinued 05/21/2016  (4) adjuvant  radiation started 04/02/2016, completed 05/21/2016  (5) anastrozole started October 2017, discontinued January 2019  (a) bone density August 2016 showed osteopenia  (b) repeat bone density 09/07/2016 shows a T score of -1.7.  (c) the patient is status post total abdominal hysterectomy with bilateral salpingo-oophorectomy.  (6) postoperative pain?: Starting OxyContin 20 mg twice a day with oxycodone as needed for breakthrough pain 03/02/2016  (a) failed transition to tramadol, gabapentin, naproxen   (b) started methadone 5 mg TID as of  12/20/2016-- not tolerated  (c) robaxin added June 2018 to NSAIDS and tylenol  (d) as of 09/11/2017, on OxyContin at 160 grams p.o. twice daily plus oxycodone for breakthrough pain  (7) Research:  (a) B-WEL study ([facility] [id number])--enrolled in Arm 1 (received education)  (b) PALLAS AFT-05 study: randomized to treatment, started palbociclib 07/26/2016   (i) dose reduced to 100 mg, then to 75 mg because of cytopenias   (ii) went off study study May 2018 because of persistent cytopenias  (c) taxane study 12/20/2016  (d) aspirin study  (e) went off studies as metastatic disease developed in June 2018  METASTATIC DISEASE 02/15/2017: CT scan of the lumbar spine and 02/27/2017 CT of the thoracic and cervical spine shows multiple bone lesions but no evidence of cord compromise; bone lesions are confirmed on PET scan 02/22/2017 which however shows no evidence of visceral disease  (a) CA-27-29 is noninformative  (8) continuing anastrozole, abemaciclib added 02/22/2017, fulvestrant started 02/22/2017 ,   (A) anastrozole discontinued January 2019  (9) yearly zolendronate given 09/26/2016, changed to monthly denosumab/Xgeva starting 03/19/2017  (10) radiation 04/01/2017 - 04/23/2017 Site/dose:   The lumbar spine was treated to 35 Gy in 14 fractions of 2.5 Gy.   Other issues: (a) poorly controlled pain: Currently on OxyContin 160 grams p.o. twice daily/ oxycodone 15-30 mg Q 4h PRN  (i) bowel prophylaxis in place  (b) left upper lobe changes noted on PET scan 02/22/2017 felt to be secondary to radiation  PLAN: Danikah is now 6  months out from definitive diagnosis of metastatic disease.  She seems more comfortable with her diagnosis.  She is still however on a lot of medication.  It is accordingly not surprising that she has some difficulties concentrating.  If she was able to get off the benzodiazepines and the gabapentin, that might help.  She is benefiting from physical therapy.  I have  encouraged her to bring her a treadmill into the house but in further to be and try to walk 5-30 minutes daily on the treadmill.  I think this will be a big boost for her in the long run.  Today we increased her long-acting medication 260 mg twice daily.  The general plan will be to continue to move the short acting narcotics to a long-acting to optimize pain control and maximize her functional status.  We are continuing the fulvestrant and Xgeva every 28 days, with lab work every 28 days, but right now she is tolerating the abemaciclib excellently.  She is going to see me again in 3 months and she will have a bone scan shortly before that visit  She knows to call for any other problems that may develop before then.  [name], Alison Dad, MD  09/11/17 1:46 PM Medical Oncology and Hematology [facility] [address] [place], [place] [address] Tel. [phone]    Fax. [phone]  This document serves as a record of services personally performed by Lurline Del, MD. It was created on his behalf by Sheron Nightingale, a trained medical scribe. The creation of this record is based on the scribe's personal observations and the provider's statements to them.   I have reviewed the above documentation for accuracy and completeness, and I agree with the above.

## 2017-09-10 NOTE — Therapy (Signed)
[place], [place], [address] Phone: [phone]   Fax:  [phone]  Physical Therapy Treatment  Patient Details  Name: Alison [name] MRN: [id number] Date of Birth: Feb 24, 1961 Referring Provider: Gardenia Phlegm, NP   Encounter Date: 09/10/2017  PT End of Session - 09/10/17 1737    Visit Number  3 in 2019    Number of Visits  26 17+ 9 for vestibular     Date for PT Re-Evaluation  10/26/17 vestibular re-eval 3/10    PT Start Time  1430    PT Stop Time  1515    PT Time Calculation (min)  45 min    Activity Tolerance  Patient tolerated treatment well    Behavior During Therapy  [facility] for tasks assessed/performed       Past Medical History:  Diagnosis Date  . Anxiety   . Asthma     seasonal asthma  . Breast cancer ([place])    metastatic breast cancer  . Complication of anesthesia 2007   aspiration pneumonia post hysterectomy.  Blood pressure would drop low- but no problems 01/2016- surgery  . History of blood transfusion   . History of mitral valve prolapse 1988   with palpitations  . History of radiation therapy 04/02/16- 05/21/16   Left Chest Wall, SCV, Axilla, IM nodes  . Hypertension    no meds  . Neuropathy    with chemo  . Osteopenia determined by x-ray   . Pneumonia    1980's and 1990's  . Raynaud's disease   . 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 Skin cancer 03/12/2014   basal- back    Past Surgical History:  Procedure Laterality Date  . ABDOMINAL HYSTERECTOMY  2007   with BSO  . APPENDECTOMY  2007  . AXILLARY LYMPH NODE DISSECTION Left 02/14/2016   Procedure: LEFT AXILLARY LYMPH NODE DISSECTION;  Surgeon: [name], MD;  Location: [place];  Service: General;  Laterality: Left;  . BREAST RECONSTRUCTION WITH PLACEMENT OF TISSUE EXPANDER AND FLEX HD (ACELLULAR HYDRATED DERMIS) Left 02/02/2016   Procedure: IMMEDIATE LEFT BREAST RECONSTRUCTION  WITH PLACEMENT OF TISSUE EXPANDER AND FLEX HD (ACELLULAR HYDRATED DERMIS);  Surgeon: [name], DO;  Location: [place];  Service: Plastics;  Laterality: Left;  . BREAST REDUCTION SURGERY Right 03/28/2017   Procedure: RIGHT MAMMARY REDUCTION  (BREAST);  Surgeon: [name], DO;  Location: [place];  Service: Plastics;  Laterality: Right;  . BREAST SURGERY Left 02/02/16   mastectomy with reconstruction  . c-section  1991  . COLON RESECTION SIGMOID  12/02/2012   [facility]   . HERNIA REPAIR  2007  . LAPAROSCOPIC ENDOMETRIOSIS FULGURATION  1984, 1989  . MASTOPEXY Right 03/28/2017   Procedure: MASTOPEXY;  Surgeon: [name], DO;  Location: [place];  Service: Plastics;  Laterality: Right;  . Port- a- cath placement  07/2015  . PORT-A-CATH REMOVAL Right 02/02/2016   Procedure: REMOVAL PORT-A-CATH;  Surgeon: [name], MD;  Location: [place];  Service: General;  Laterality: Right;  . RADIOACTIVE SEED GUIDED AXILLARY SENTINEL LYMPH NODE Left 02/02/2016   Procedure: LEFT BREAST MASTECTOMY AND RADIOACTIVE SEED GUIDED LEFT SENTINEL LYMPH NODE BIOPSY ;  Surgeon: [name], MD;  Location: [place];  Service: General;  Laterality: Left;  . REMOVAL OF TISSUE EXPANDER AND PLACEMENT OF IMPLANT Left 03/28/2017   Procedure: REMOVAL OF TISSUE  EXPANDER AND PLACEMENT OF IMPLANT;  Surgeon: [name], DO;  Location: [place];  Service: Plastics;  Laterality: Left;    There were no vitals filed for this visit.  Subjective Assessment - 09/10/17 1454    Subjective  Pt got some good news about her PET scan.  There are no new lesions and some of current lesions are smaller. She feels good today and wants to exercise .     Pertinent History  Breast cancer diagnosed November 2016 She had chemotherapy. but did have some neuropathy so it had to be stopped. On June 8, she had a mastectomy with  immedicate expander placement and with first set of lymphnodes removed, On June 20 she went for complete axillary lymph node dissection. She says she has had always been "full necked" but has neck and upper body fullness since the cancer was diagnosed. She reports she has osteopenia in low back and has had a fracture that was discovered in 2014 when she had a Dexa scan. Pt has had metastasis to the bones of the spine and recent  bilateral breast surgery for a reduction on the right and expander removal on the left with implant placement and removal     Patient Stated Goals  to improve her dizziness and prevent future falls    Currently in Pain?  No/denies                      [facility] Adult PT Treatment/Exercise - 09/10/17 0001      Lumbar Exercises: Aerobic   Tread Mill  0% grad 1.5 mph for 10 minutes       Lumbar Exercises: Standing   Functional Squats Limitations  +      Knee/Hip Exercises: Standing   Lateral Step Up  Right;Left;5 reps;Step Height: 6" holding onto 2 dowels for balance    Forward Step Up  Right;Left;5 reps;Step Height: 6" holding 2 dowels for support     Step Down  Right;5 reps with backward step ups       Shoulder Exercises: Standing   Row  Strengthening;Right;Left;10 reps bilateral, then unilateral alternating     Theraband Level (Shoulder Row)  Level 1 (Yellow)               PT Short Term Goals - 09/04/17 2112      PT SHORT TERM GOAL #3   Title  Vestibular goal: pt will participate in further assessment of gait with FGA and gait velocity    Time  4    Period  Weeks    Status  New    Target Date  10/04/17      PT SHORT TERM GOAL #4   Title  Vestibular goal: pt will participate in motion sensitivity and SOT testing with habituation HEP to be initiated    Time  4    Period  Weeks    Status  New    Target Date  10/04/17      Short Term Clinic Goals - 08/28/17 1118      CC Short Term Goal  #1   Title  Pt will report she knows strategies  for good body mechanics to decrease low back pain     Status  Achieved      CC Short Term Goal  #2   Title  Pt will have a deacrease in circumference 15 cm prox to ulnar styloid of left forearm by 1 cm  Baseline  25 cm , on 05/16/2017 it is 23.5    Status  Achieved      CC Short Term Goal  #3   Title  Pt will report she knows how to manage left UE lymphedema with use of compression, elevation and exercise, self manual lymph drainage and possibly Flexitouch     Baseline  Pt has day and nighttime garments and Flexitouch     Status  Achieved       PT Long Term Goals - 09/04/17 2115      PT LONG TERM GOAL #3   Title  VESTIBULAR GOALS: Pt will demonstrate independence with vestibular and balance HEP    Time  8    Period  Weeks    Status  New    Target Date  11/03/17      PT LONG TERM GOAL #4   Title  Pt will report >/= 10 point improvement in overall function (as reported on FOTO) by D/C    Baseline  73% (27% Limited)    Time  8    Period  Weeks    Status  New    Target Date  11/03/17      PT LONG TERM GOAL #5   Title  Pt will report decrease in symptom severity to </= 1/5 on MSQ    Time  8    Period  Weeks    Status  New    Target Date  11/03/17      Additional Long Term Goals   Additional Long Term Goals  Yes      PT LONG TERM GOAL #6   Title  Pt will improve FGA by 8 points to decrease falls risk    Time  8    Period  Weeks    Status  New    Target Date  11/03/17      PT LONG TERM GOAL #7   Title  Pt will improve gait velocity to >3.6 ft/sec     Time  8    Period  Weeks    Status  New    Target Date  11/03/17      PT LONG TERM GOAL #8   Title  Pt will improve SOT composite score by 10 points    Time  8    Period  Weeks    Status  New    Target Date  11/03/17        Long Term Clinic Goals - 08/28/17 1119      CC Long Term Goal  #1   Title  Patient will report  that she can manage back pain  so she can perform daily activities with greater ease     Status  Achieved      CC Long Term Goal  #2   Title  Pt will  have given the TENS unit a trial to see if it will help with back pain     Baseline  has not needed to use TENS     Status  Deferred      CC Long Term Goal  #3   Title  Pt will be independent in core and  LE strengthening exercises       CC Long Term Goal  #4   Title  Pt will report low back pain is decreased to a 4/10 so that she can perform activities of daily living easier     Baseline  It depends on the day ,  but pt needs to take breaks and knows how to manage her pain, she feels this goal is achieved     Status  Achieved         Plan - 09/10/17 1739    Clinical Impression Statement  Pt is doing well today and did well with resuming treadmill and LE exercise that she has not been doing in recent weeks.  She feels that PT exercise is improving her functional level.  She is looking forward to learning exercises to help with her dizziness and balance .     Clinical Impairments Affecting Rehab Potential  mulitple areas of diffuse bony metastasis, chemotherapy with neuropathy , radiation to back  and chest,    PT Treatment/Interventions  ADLs/Self Care Home Management;Manual lymph drainage;Compression bandaging;Scar mobilization;Passive range of motion;Patient/family education;Taping;Manual techniques;Therapeutic exercise;Therapeutic activities;Orthotic Fit/Training;DME Instruction;Electrical Stimulation;Moist Heat;Gait training;Stair training;Functional mobility training;Balance training;Neuromuscular re-education;Vestibular    PT Next Visit Plan  contiue with exrcise for aerobics and LE strenth or Focus on myofascial work to left chest and manual lymph draiange to left axilla contine with kinesiotape prn; VESTIBULAR PLAN: assess FGA and gait velocity-revise goals. Complete MSQ and SOT. Initiate habituation HEP       Patient will benefit from skilled therapeutic intervention in order to improve the following deficits and  impairments:  Decreased range of motion, Impaired UE functional use, Decreased activity tolerance, Decreased knowledge of precautions, Decreased skin integrity, Impaired perceived functional ability, Pain, Decreased knowledge of use of DME, Increased edema, Postural dysfunction, Decreased strength, Impaired sensation, Decreased scar mobility, Decreased mobility, Increased muscle spasms, Dizziness, Increased fascial restricitons, Decreased balance, Difficulty walking  Visit Diagnosis: Unsteadiness on feet  Repeated falls  Difficulty in walking, not elsewhere classified  Other disturbances of skin sensation  Abnormal posture  Localized swelling, mass and lump, neck     Problem List Patient Active Problem List   Diagnosis Date Noted  . Counseling regarding goals of care 04/16/2017  . Bone metastases ([place]) 02/28/2017  . Uncontrolled pain 02/28/2017  . Osteopenia determined by x-ray 09/20/2016  . Primary malignant neoplasm of left breast with stage 2 nodal metastasis per American Joint Committee on Cancer 7th edition (N2) ([place]) 02/14/2016  . Chemotherapy-induced neuropathy ([place]) 12/30/2015  . Insomnia 09/01/2015  . Malignant neoplasm of upper-outer quadrant of left breast in female, estrogen receptor positive ([place]) 08/08/2015   Donato Heinz. Owens Shark PT  Norwood Levo 09/10/2017, 5:43 PM  [place], [place], [address] Phone: [phone]   Fax:  [phone]  Name: Alison [name] MRN: [id number] Date of Birth: 1960-09-25

## 2017-09-11 ENCOUNTER — Inpatient Hospital Stay: Payer: 59

## 2017-09-11 ENCOUNTER — Telehealth: Payer: Self-pay | Admitting: Oncology

## 2017-09-11 ENCOUNTER — Other Ambulatory Visit: Payer: Self-pay | Admitting: *Deleted

## 2017-09-11 ENCOUNTER — Inpatient Hospital Stay: Payer: 59 | Attending: Oncology | Admitting: Oncology

## 2017-09-11 VITALS — BP 143/83 | HR 100 | Temp 98.7°F | Resp 20 | Ht 62.0 in | Wt 149.0 lb

## 2017-09-11 DIAGNOSIS — Z923 Personal history of irradiation: Secondary | ICD-10-CM | POA: Insufficient documentation

## 2017-09-11 DIAGNOSIS — C50912 Malignant neoplasm of unspecified site of left female breast: Secondary | ICD-10-CM

## 2017-09-11 DIAGNOSIS — Z5111 Encounter for antineoplastic chemotherapy: Secondary | ICD-10-CM | POA: Insufficient documentation

## 2017-09-11 DIAGNOSIS — Z87891 Personal history of nicotine dependence: Secondary | ICD-10-CM | POA: Insufficient documentation

## 2017-09-11 DIAGNOSIS — C50412 Malignant neoplasm of upper-outer quadrant of left female breast: Secondary | ICD-10-CM

## 2017-09-11 DIAGNOSIS — G620 Drug-induced polyneuropathy: Secondary | ICD-10-CM | POA: Diagnosis not present

## 2017-09-11 DIAGNOSIS — T451X5A Adverse effect of antineoplastic and immunosuppressive drugs, initial encounter: Secondary | ICD-10-CM

## 2017-09-11 DIAGNOSIS — Z79899 Other long term (current) drug therapy: Secondary | ICD-10-CM | POA: Insufficient documentation

## 2017-09-11 DIAGNOSIS — C7951 Secondary malignant neoplasm of bone: Secondary | ICD-10-CM | POA: Diagnosis not present

## 2017-09-11 DIAGNOSIS — Z170 Estrogen receptor positive status [ER+]: Secondary | ICD-10-CM | POA: Insufficient documentation

## 2017-09-11 DIAGNOSIS — Z79811 Long term (current) use of aromatase inhibitors: Secondary | ICD-10-CM | POA: Diagnosis not present

## 2017-09-11 DIAGNOSIS — G893 Neoplasm related pain (acute) (chronic): Secondary | ICD-10-CM | POA: Diagnosis not present

## 2017-09-11 DIAGNOSIS — C779 Secondary and unspecified malignant neoplasm of lymph node, unspecified: Secondary | ICD-10-CM

## 2017-09-11 DIAGNOSIS — C773 Secondary and unspecified malignant neoplasm of axilla and upper limb lymph nodes: Secondary | ICD-10-CM | POA: Insufficient documentation

## 2017-09-11 DIAGNOSIS — Z90710 Acquired absence of both cervix and uterus: Secondary | ICD-10-CM | POA: Diagnosis not present

## 2017-09-11 DIAGNOSIS — Z9221 Personal history of antineoplastic chemotherapy: Secondary | ICD-10-CM | POA: Insufficient documentation

## 2017-09-11 DIAGNOSIS — M8580 Other specified disorders of bone density and structure, unspecified site: Secondary | ICD-10-CM

## 2017-09-11 LAB — CBC WITH DIFFERENTIAL/PLATELET
BASOS PCT: 1 %
Basophils Absolute: 0 10*3/uL (ref 0.0–0.1)
Eosinophils Absolute: 0.2 10*3/uL (ref 0.0–0.5)
Eosinophils Relative: 6 %
HEMATOCRIT: 35.6 % (ref 34.8–46.6)
HEMOGLOBIN: 12 g/dL (ref 11.6–15.9)
LYMPHS ABS: 0.8 10*3/uL — AB (ref 0.9–3.3)
Lymphocytes Relative: 22 %
MCH: 34 pg (ref 25.1–34.0)
MCHC: 33.7 g/dL (ref 31.5–36.0)
MCV: 100.8 fL (ref 79.5–101.0)
MONOS PCT: 9 %
Monocytes Absolute: 0.3 10*3/uL (ref 0.1–0.9)
NEUTROS ABS: 2.2 10*3/uL (ref 1.5–6.5)
NEUTROS PCT: 62 %
Platelets: 160 10*3/uL (ref 145–400)
RBC: 3.53 MIL/uL — ABNORMAL LOW (ref 3.70–5.45)
RDW: 13.2 % (ref 11.2–16.1)
WBC: 3.6 10*3/uL — ABNORMAL LOW (ref 3.9–10.3)

## 2017-09-11 LAB — CMP (CANCER CENTER ONLY)
ALBUMIN: 3.7 g/dL (ref 3.5–5.0)
ALK PHOS: 97 U/L (ref 40–150)
ALT: 42 U/L (ref 0–55)
ANION GAP: 11 (ref 3–11)
AST: 45 U/L — ABNORMAL HIGH (ref 5–34)
BILIRUBIN TOTAL: 0.4 mg/dL (ref 0.2–1.2)
BUN: 10 mg/dL (ref 7–26)
CALCIUM: 9.2 mg/dL (ref 8.4–10.4)
CO2: 25 mmol/L (ref 22–29)
Chloride: 102 mmol/L (ref 98–109)
Creatinine: 0.74 mg/dL (ref 0.60–1.10)
GFR, Est AFR Am: >60 mL/min (ref >60)
GFR, Estimated: >60 mL/min (ref >60)
GLUCOSE: 93 mg/dL (ref 70–140)
Potassium: 4.4 mmol/L (ref 3.3–4.7)
Sodium: 138 mmol/L (ref 136–145)
TOTAL PROTEIN: 7.1 g/dL (ref 6.4–8.3)

## 2017-09-11 MED ORDER — DENOSUMAB 120 MG/1.7ML ~~LOC~~ SOLN
SUBCUTANEOUS | Status: AC
  Filled 2017-09-11: qty 1.7

## 2017-09-11 MED ORDER — OXYCODONE HCL ER 80 MG PO T12A: 160.0000 mg | EXTENDED_RELEASE_TABLET | Freq: Three times a day (TID) | ORAL | 0 refills | Status: DC

## 2017-09-11 MED ORDER — FULVESTRANT 250 MG/5ML IM SOLN
500.0000 mg | Freq: Once | INTRAMUSCULAR | Status: AC
  Administered 2017-09-11: 500 mg via INTRAMUSCULAR

## 2017-09-11 MED ORDER — FULVESTRANT 250 MG/5ML IM SOLN
INTRAMUSCULAR | Status: AC
  Filled 2017-09-11: qty 5

## 2017-09-11 MED ORDER — DENOSUMAB 120 MG/1.7ML ~~LOC~~ SOLN
120.0000 mg | Freq: Once | SUBCUTANEOUS | Status: AC
  Administered 2017-09-11: 120 mg via SUBCUTANEOUS

## 2017-09-11 MED ORDER — OXYCODONE HCL 15 MG PO TABS: 15.0000 mg | ORAL_TABLET | ORAL | 0 refills | Status: DC | PRN

## 2017-09-11 NOTE — Telephone Encounter (Signed)
Gave patient AVS and calendars of upcoming January through June appointments.

## 2017-09-12 ENCOUNTER — Encounter: Payer: Self-pay | Admitting: Physical Therapy

## 2017-09-12 ENCOUNTER — Ambulatory Visit: Payer: 59 | Admitting: Physical Therapy

## 2017-09-12 DIAGNOSIS — M25512 Pain in left shoulder: Secondary | ICD-10-CM

## 2017-09-12 DIAGNOSIS — M5440 Lumbago with sciatica, unspecified side: Secondary | ICD-10-CM

## 2017-09-12 DIAGNOSIS — R262 Difficulty in walking, not elsewhere classified: Secondary | ICD-10-CM

## 2017-09-12 DIAGNOSIS — R293 Abnormal posture: Secondary | ICD-10-CM

## 2017-09-12 DIAGNOSIS — R221 Localized swelling, mass and lump, neck: Secondary | ICD-10-CM

## 2017-09-12 DIAGNOSIS — M25612 Stiffness of left shoulder, not elsewhere classified: Secondary | ICD-10-CM

## 2017-09-12 DIAGNOSIS — G8929 Other chronic pain: Secondary | ICD-10-CM

## 2017-09-12 DIAGNOSIS — R208 Other disturbances of skin sensation: Secondary | ICD-10-CM

## 2017-09-12 DIAGNOSIS — R296 Repeated falls: Secondary | ICD-10-CM

## 2017-09-12 DIAGNOSIS — L599 Disorder of the skin and subcutaneous tissue related to radiation, unspecified: Secondary | ICD-10-CM

## 2017-09-12 DIAGNOSIS — R2681 Unsteadiness on feet: Secondary | ICD-10-CM

## 2017-09-12 NOTE — Therapy (Signed)
[place], [place], [address] Phone: [phone]   Fax:  [phone]  Physical Therapy Treatment  Patient Details  Name: Alison [name] MRN: [id number] Date of Birth: [birth year] Referring Provider: Gardenia Phlegm, NP   Encounter Date: 09/12/2017  PT End of Session - 09/12/17 1710    Visit Number  4 in 2019    Number of Visits  26    Date for PT Re-Evaluation  10/26/17    PT Start Time  1600    PT Stop Time  1647    PT Time Calculation (min)  47 min    Activity Tolerance  Patient tolerated treatment well    Behavior During Therapy  [facility] for tasks assessed/performed       Past Medical History:  Diagnosis Date  . Anxiety   . Asthma     seasonal asthma  . Breast cancer ([place])    metastatic breast cancer  . Complication of anesthesia 2007   aspiration pneumonia post hysterectomy.  Blood pressure would drop low- but no problems 01/2016- surgery  . History of blood transfusion   . History of mitral valve prolapse 1988   with palpitations  . History of radiation therapy 04/02/16- 05/21/16   Left Chest Wall, SCV, Axilla, IM nodes  . Hypertension    no meds  . Neuropathy    with chemo  . Osteopenia determined by x-ray   . Pneumonia    1980's and 1990's  . Raynaud's disease   . 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 Skin cancer 03/12/2014   basal- back    Past Surgical History:  Procedure Laterality Date  . ABDOMINAL HYSTERECTOMY  2007   with BSO  . APPENDECTOMY  2007  . AXILLARY LYMPH NODE DISSECTION Left 02/14/2016   Procedure: LEFT AXILLARY LYMPH NODE DISSECTION;  Surgeon: [name], MD;  Location: [place];  Service: General;  Laterality: Left;  . BREAST RECONSTRUCTION WITH PLACEMENT OF TISSUE EXPANDER AND FLEX HD (ACELLULAR HYDRATED DERMIS) Left 02/02/2016   Procedure: IMMEDIATE LEFT BREAST RECONSTRUCTION WITH PLACEMENT OF TISSUE EXPANDER AND FLEX HD  (ACELLULAR HYDRATED DERMIS);  Surgeon: [name], DO;  Location: [place];  Service: Plastics;  Laterality: Left;  . BREAST REDUCTION SURGERY Right 03/28/2017   Procedure: RIGHT MAMMARY REDUCTION  (BREAST);  Surgeon: [name], DO;  Location: [place];  Service: Plastics;  Laterality: Right;  . BREAST SURGERY Left 02/02/16   mastectomy with reconstruction  . c-section  1991  . COLON RESECTION SIGMOID  12/02/2012   [facility]   . HERNIA REPAIR  2007  . LAPAROSCOPIC ENDOMETRIOSIS FULGURATION  1984, 1989  . MASTOPEXY Right 03/28/2017   Procedure: MASTOPEXY;  Surgeon: [name], DO;  Location: [place];  Service: Plastics;  Laterality: Right;  . Port- a- cath placement  07/2015  . PORT-A-CATH REMOVAL Right 02/02/2016   Procedure: REMOVAL PORT-A-CATH;  Surgeon: [name], MD;  Location: [place];  Service: General;  Laterality: Right;  . RADIOACTIVE SEED GUIDED AXILLARY SENTINEL LYMPH NODE Left 02/02/2016   Procedure: LEFT BREAST MASTECTOMY AND RADIOACTIVE SEED GUIDED LEFT SENTINEL LYMPH NODE BIOPSY ;  Surgeon: [name], MD;  Location: [place];  Service: General;  Laterality: Left;  . REMOVAL OF TISSUE EXPANDER AND PLACEMENT OF IMPLANT Left 03/28/2017   Procedure: REMOVAL OF TISSUE EXPANDER AND PLACEMENT OF IMPLANT;  Surgeon: Marla Roe,  Loel Lofty, DO;  Location: [place];  Service: Plastics;  Laterality: Left;    There were no vitals filed for this visit.  Subjective Assessment - 09/12/17 1622    Subjective  Pt had a busy day today with household chores.  She was pleased that she could do so much and feels like the exercise is helping She went to see Dr. [name] yesterday and will have a bone scan in few weeks     Pertinent History  Breast cancer diagnosed November 2016 She had chemotherapy. but did have some neuropathy so it had to be stopped. On June 8, she had a  mastectomy with immedicate expander placement and with first set of lymphnodes removed, On June 20 she went for complete axillary lymph node dissection. She says she has had always been "full necked" but has neck and upper body fullness since the cancer was diagnosed. She reports she has osteopenia in low back and has had a fracture that was discovered in 2014 when she had a Dexa scan. Pt has had metastasis to the bones of the spine and recent  bilateral breast surgery for a reduction on the right and expander removal on the left with implant placement and removal     Patient Stated Goals  to improve her dizziness and prevent future falls    Currently in Pain?  No/denies                      [facility] Adult PT Treatment/Exercise - 09/12/17 0001      Lumbar Exercises: Aerobic   Tread Mill  0% grad 1.5 mph for 12 minutes       Lumbar Exercises: Standing   Other Standing Lumbar Exercises  standing on airex in treamill for bilateral UE support, raised up on toes and quickly lowered onto heels with partial squat to increase      Shoulder Exercises: Standing   Row  Strengthening;Right;Left;10 reps bilateral, then unilateral alternating     Theraband Level (Shoulder Row)  Level 2 (Red)      Manual Therapy   Myofascial Release  to scar on left chest     Manual Lymphatic Drainage (MLD)  to left chest, upper and lower back pt brought ledlum oil solution to use during massage                PT Short Term Goals - 09/04/17 2112      PT SHORT TERM GOAL #3   Title  Vestibular goal: pt will participate in further assessment of gait with FGA and gait velocity    Time  4    Period  Weeks    Status  New    Target Date  10/04/17      PT SHORT TERM GOAL #4   Title  Vestibular goal: pt will participate in motion sensitivity and SOT testing with habituation HEP to be initiated    Time  4    Period  Weeks    Status  New    Target Date  10/04/17      Short Term Clinic Goals -  08/28/17 1118      CC Short Term Goal  #1   Title  Pt will report she knows strategies for good body mechanics to decrease low back pain     Status  Achieved      CC Short Term Goal  #2   Title  Pt will have a deacrease in circumference  15 cm prox to ulnar styloid of left forearm by 1 cm     Baseline  25 cm , on 05/16/2017 it is 23.5    Status  Achieved      CC Short Term Goal  #3   Title  Pt will report she knows how to manage left UE lymphedema with use of compression, elevation and exercise, self manual lymph drainage and possibly Flexitouch     Baseline  Pt has day and nighttime garments and Flexitouch     Status  Achieved       PT Long Term Goals - 09/04/17 2115      PT LONG TERM GOAL #3   Title  VESTIBULAR GOALS: Pt will demonstrate independence with vestibular and balance HEP    Time  8    Period  Weeks    Status  New    Target Date  11/03/17      PT LONG TERM GOAL #4   Title  Pt will report >/= 10 point improvement in overall function (as reported on FOTO) by D/C    Baseline  73% (27% Limited)    Time  8    Period  Weeks    Status  New    Target Date  11/03/17      PT LONG TERM GOAL #5   Title  Pt will report decrease in symptom severity to </= 1/5 on MSQ    Time  8    Period  Weeks    Status  New    Target Date  11/03/17      Additional Long Term Goals   Additional Long Term Goals  Yes      PT LONG TERM GOAL #6   Title  Pt will improve FGA by 8 points to decrease falls risk    Time  8    Period  Weeks    Status  New    Target Date  11/03/17      PT LONG TERM GOAL #7   Title  Pt will improve gait velocity to >3.6 ft/sec     Time  8    Period  Weeks    Status  New    Target Date  11/03/17      PT LONG TERM GOAL #8   Title  Pt will improve SOT composite score by 10 points    Time  8    Period  Weeks    Status  New    Target Date  11/03/17        Long Term Clinic Goals - 08/28/17 1119      CC Long Term Goal  #1   Title  Patient will report   that she can manage back pain  so she can perform daily activities with greater ease    Status  Achieved      CC Long Term Goal  #2   Title  Pt will  have given the TENS unit a trial to see if it will help with back pain     Baseline  has not needed to use TENS     Status  Deferred      CC Long Term Goal  #3   Title  Pt will be independent in core and  LE strengthening exercises       CC Long Term Goal  #4   Title  Pt will report low back pain is decreased to a 4/10 so that she can perform  activities of daily living easier     Baseline  It depends on the day , but pt needs to take breaks and knows how to manage her pain, she feels this goal is achieved     Status  Achieved         Plan - 09/12/17 1710    Clinical Impression Statement  Pt is doing very well today and reports she had a very productive day doing many things at home with little pain or fatigue.  She is still having tightness in skin of her chest with fullness in her left axilla     Clinical Impairments Affecting Rehab Potential  mulitple areas of diffuse bony metastasis, chemotherapy with neuropathy , radiation to back  and chest,    PT Frequency  Other (comment)    PT Next Visit Plan  contiue with exrcise for aerobics and LE strenth or Focus on myofascial work to left chest and manual lymph draiange to left axilla contine with kinesiotape prn; VESTIBULAR PLAN: assess FGA and gait velocity-revise goals. Complete MSQ and SOT. Initiate habituation HEP       Patient will benefit from skilled therapeutic intervention in order to improve the following deficits and impairments:     Visit Diagnosis: Unsteadiness on feet  Repeated falls  Difficulty in walking, not elsewhere classified  Other disturbances of skin sensation  Abnormal posture  Localized swelling, mass and lump, neck  Chronic left shoulder pain  Low back pain with sciatica, sciatica laterality unspecified, unspecified back pain laterality, unspecified  chronicity  Stiffness of left shoulder joint  Disorder of the skin and subcutaneous tissue related to radiation, unspecified     Problem List Patient Active Problem List   Diagnosis Date Noted  . Pain from bone metastases ([place]) 09/11/2017  . Counseling regarding goals of care 04/16/2017  . Bone metastases ([place]) 02/28/2017  . Uncontrolled pain 02/28/2017  . Osteopenia determined by x-ray 09/20/2016  . Primary malignant neoplasm of left breast with stage 2 nodal metastasis per American Joint Committee on Cancer 7th edition (N2) ([place]) 02/14/2016  . Chemotherapy-induced neuropathy ([place]) 12/30/2015  . Insomnia 09/01/2015  . Malignant neoplasm of upper-outer quadrant of left breast in female, estrogen receptor positive ([place]) 08/08/2015   Donato Heinz. Owens Shark PT  [place] Levo 09/12/2017, 5:14 PM  [place], [place], [address] Phone: [phone]   Fax:  [phone]  Name: Alison [name] MRN: [id number] Date of Birth: Dec 09, 1960

## 2017-09-16 ENCOUNTER — Telehealth: Payer: Self-pay | Admitting: Pharmacy Technician

## 2017-09-16 NOTE — Telephone Encounter (Signed)
Oral Oncology Patient Advocate Encounter  Received notification from [place] that prior authorization for [facility] requires renewal.  PA submitted on CoverMyMeds Key [id number] Status is pending  Oral Oncology Clinic will continue to follow.  Fabio Asa. Melynda Keller, [place] Patient [place] [phone] 09/16/2017 2:47 PM

## 2017-09-17 ENCOUNTER — Ambulatory Visit: Payer: 59 | Admitting: Physical Therapy

## 2017-09-17 ENCOUNTER — Encounter: Payer: Self-pay | Admitting: Physical Therapy

## 2017-09-17 DIAGNOSIS — M25612 Stiffness of left shoulder, not elsewhere classified: Secondary | ICD-10-CM

## 2017-09-17 DIAGNOSIS — R293 Abnormal posture: Secondary | ICD-10-CM | POA: Diagnosis not present

## 2017-09-17 DIAGNOSIS — M5440 Lumbago with sciatica, unspecified side: Secondary | ICD-10-CM

## 2017-09-17 DIAGNOSIS — R2681 Unsteadiness on feet: Secondary | ICD-10-CM

## 2017-09-17 DIAGNOSIS — M25512 Pain in left shoulder: Secondary | ICD-10-CM

## 2017-09-17 DIAGNOSIS — R221 Localized swelling, mass and lump, neck: Secondary | ICD-10-CM

## 2017-09-17 DIAGNOSIS — G8929 Other chronic pain: Secondary | ICD-10-CM

## 2017-09-17 NOTE — Therapy (Signed)
[facility] [place], [place], [address] Phone: [phone]   Fax:  [phone]  Physical Therapy Treatment  Patient Details  Name: Alison [name] MRN: [id number] Date of Birth: [birth year] Referring Provider: Gardenia Phlegm, NP   Encounter Date: 09/17/2017  PT End of Session - 09/17/17 1706    Visit Number  5 in 2019    Number of Visits  26    Date for PT Re-Evaluation  10/26/17    PT Start Time  1430    PT Stop Time  1515    PT Time Calculation (min)  45 min    Activity Tolerance  Patient tolerated treatment well       Past Medical History:  Diagnosis Date  . Anxiety   . Asthma     seasonal asthma  . Breast cancer ([place])    metastatic breast cancer  . Complication of anesthesia 2007   aspiration pneumonia post hysterectomy.  Blood pressure would drop low- but no problems 01/2016- surgery  . History of blood transfusion   . History of mitral valve prolapse 1988   with palpitations  . History of radiation therapy 04/02/16- 05/21/16   Left Chest Wall, SCV, Axilla, IM nodes  . Hypertension    no meds  . Neuropathy    with chemo  . Osteopenia determined by x-ray   . Pneumonia    1980's and 1990's  . Raynaud's disease   . 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 Skin cancer 03/12/2014   basal- back    Past Surgical History:  Procedure Laterality Date  . ABDOMINAL HYSTERECTOMY  2007   with BSO  . APPENDECTOMY  2007  . AXILLARY LYMPH NODE DISSECTION Left 02/14/2016   Procedure: LEFT AXILLARY LYMPH NODE DISSECTION;  Surgeon: [name], MD;  Location: [facility];  Service: General;  Laterality: Left;  . BREAST RECONSTRUCTION WITH PLACEMENT OF TISSUE EXPANDER AND FLEX HD (ACELLULAR HYDRATED DERMIS) Left 02/02/2016   Procedure: IMMEDIATE LEFT BREAST RECONSTRUCTION WITH PLACEMENT OF TISSUE EXPANDER AND FLEX HD (ACELLULAR HYDRATED DERMIS);  Surgeon: [name],  DO;  Location: [facility];  Service: Plastics;  Laterality: Left;  . BREAST REDUCTION SURGERY Right 03/28/2017   Procedure: RIGHT MAMMARY REDUCTION  (BREAST);  Surgeon: [name], DO;  Location: [facility];  Service: Plastics;  Laterality: Right;  . BREAST SURGERY Left 02/02/16   mastectomy with reconstruction  . c-section  1991  . COLON RESECTION SIGMOID  12/02/2012   [facility]   . HERNIA REPAIR  2007  . LAPAROSCOPIC ENDOMETRIOSIS FULGURATION  1984, 1989  . MASTOPEXY Right 03/28/2017   Procedure: MASTOPEXY;  Surgeon: [name], DO;  Location: [facility];  Service: Plastics;  Laterality: Right;  . Port- a- cath placement  07/2015  . PORT-A-CATH REMOVAL Right 02/02/2016   Procedure: REMOVAL PORT-A-CATH;  Surgeon: [name], MD;  Location: [facility];  Service: General;  Laterality: Right;  . RADIOACTIVE SEED GUIDED AXILLARY SENTINEL LYMPH NODE Left 02/02/2016   Procedure: LEFT BREAST MASTECTOMY AND RADIOACTIVE SEED GUIDED LEFT SENTINEL LYMPH NODE BIOPSY ;  Surgeon: [name], MD;  Location: [facility];  Service: General;  Laterality: Left;  . REMOVAL OF TISSUE EXPANDER AND PLACEMENT OF IMPLANT Left 03/28/2017   Procedure: REMOVAL OF TISSUE EXPANDER AND PLACEMENT OF IMPLANT;  Surgeon: [name], DO;  Location: [facility];  Service:  Plastics;  Laterality: Left;    There were no vitals filed for this visit.  Subjective Assessment - 09/17/17 1441    Subjective  pt states she is having congestion in her head today and her joints have been hurting more.  She says it may be becasue freezing rain is coming     Pertinent History  Breast cancer diagnosed November 2016 She had chemotherapy. but did have some neuropathy so it had to be stopped. On June 8, she had a mastectomy with immedicate expander placement and with first set of lymphnodes removed, On June 20 she went for complete axillary  lymph node dissection. She says she has had always been "full necked" but has neck and upper body fullness since the cancer was diagnosed. She reports she has osteopenia in low back and has had a fracture that was discovered in 2014 when she had a Dexa scan. Pt has had metastasis to the bones of the spine and recent  bilateral breast surgery for a reduction on the right and expander removal on the left with implant placement and removal     Currently in Pain?  Yes    Pain Score  3     Pain Location  Head    Pain Descriptors / Indicators  Aching    Pain Type  Acute pain    Pain Onset  Today                      [facility] Adult PT Treatment/Exercise - 09/17/17 0001      Lumbar Exercises: Aerobic   Tread Mill  0% grad 1.5 mph for 13 minutes       Manual Therapy   Myofascial Release  to scar on left chest     Manual Lymphatic Drainage (MLD)  to left chest, upper and lower back pt brought ledlum oil solution to use during massage                PT Short Term Goals - 09/04/17 2112      PT SHORT TERM GOAL #3   Title  Vestibular goal: pt will participate in further assessment of gait with FGA and gait velocity    Time  4    Period  Weeks    Status  New    Target Date  10/04/17      PT SHORT TERM GOAL #4   Title  Vestibular goal: pt will participate in motion sensitivity and SOT testing with habituation HEP to be initiated    Time  4    Period  Weeks    Status  New    Target Date  10/04/17      Short Term Clinic Goals - 08/28/17 1118      CC Short Term Goal  #1   Title  Pt will report she knows strategies for good body mechanics to decrease low back pain     Status  Achieved      CC Short Term Goal  #2   Title  Pt will have a deacrease in circumference 15 cm prox to ulnar styloid of left forearm by 1 cm     Baseline  25 cm , on 05/16/2017 it is 23.5    Status  Achieved      CC Short Term Goal  #3   Title  Pt will report she knows how to manage left UE  lymphedema with use of compression, elevation and exercise, self manual lymph drainage  and possibly Flexitouch     Baseline  Pt has day and nighttime garments and Flexitouch     Status  Achieved       PT Long Term Goals - 09/04/17 2115      PT LONG TERM GOAL #3   Title  VESTIBULAR GOALS: Pt will demonstrate independence with vestibular and balance HEP    Time  8    Period  Weeks    Status  New    Target Date  11/03/17      PT LONG TERM GOAL #4   Title  Pt will report >/= 10 point improvement in overall function (as reported on FOTO) by D/C    Baseline  73% (27% Limited)    Time  8    Period  Weeks    Status  New    Target Date  11/03/17      PT LONG TERM GOAL #5   Title  Pt will report decrease in symptom severity to </= 1/5 on MSQ    Time  8    Period  Weeks    Status  New    Target Date  11/03/17      Additional Long Term Goals   Additional Long Term Goals  Yes      PT LONG TERM GOAL #6   Title  Pt will improve FGA by 8 points to decrease falls risk    Time  8    Period  Weeks    Status  New    Target Date  11/03/17      PT LONG TERM GOAL #7   Title  Pt will improve gait velocity to >3.6 ft/sec     Time  8    Period  Weeks    Status  New    Target Date  11/03/17      PT LONG TERM GOAL #8   Title  Pt will improve SOT composite score by 10 points    Time  8    Period  Weeks    Status  New    Target Date  11/03/17        Long Term Clinic Goals - 08/28/17 1119      CC Long Term Goal  #1   Title  Patient will report  that she can manage back pain  so she can perform daily activities with greater ease    Status  Achieved      CC Long Term Goal  #2   Title  Pt will  have given the TENS unit a trial to see if it will help with back pain     Baseline  has not needed to use TENS     Status  Deferred      CC Long Term Goal  #3   Title  Pt will be independent in core and  LE strengthening exercises       CC Long Term Goal  #4   Title  Pt will report low  back pain is decreased to a 4/10 so that she can perform activities of daily living easier     Baseline  It depends on the day , but pt needs to take breaks and knows how to manage her pain, she feels this goal is achieved     Status  Achieved         Plan - 09/17/17 1707    Clinical Impression Statement  Pt with head congestion and dizziness today.  She is anxious to learn more balance exercises and compensatoin strategies to help with that.  Focused on endurance and MLD today to try to help with chest tightness and lymphatic congestion     Clinical Impairments Affecting Rehab Potential  mulitple areas of diffuse bony metastasis, chemotherapy with neuropathy , radiation to back  and chest,    PT Frequency  2x / week    PT Duration  8 weeks    PT Treatment/Interventions  ADLs/Self Care Home Management;Manual lymph drainage;Compression bandaging;Scar mobilization;Passive range of motion;Patient/family education;Taping;Manual techniques;Therapeutic exercise;Therapeutic activities;Orthotic Fit/Training;DME Instruction;Electrical Stimulation;Moist Heat;Gait training;Stair training;Functional mobility training;Balance training;Neuromuscular re-education;Vestibular    PT Next Visit Plan  contiue with exrcise for aerobics and LE strenth or Focus on myofascial work to left chest and manual lymph draiange to left axilla contine with kinesiotape prn; VESTIBULAR PLAN: assess FGA and gait velocity-revise goals. Complete MSQ and SOT. Initiate habituation HEP       Patient will benefit from skilled therapeutic intervention in order to improve the following deficits and impairments:  Decreased range of motion, Impaired UE functional use, Decreased activity tolerance, Decreased knowledge of precautions, Decreased skin integrity, Impaired perceived functional ability, Pain, Decreased knowledge of use of DME, Increased edema, Postural dysfunction, Decreased strength, Impaired sensation, Decreased scar mobility,  Decreased mobility, Increased muscle spasms, Dizziness, Increased fascial restricitons, Decreased balance, Difficulty walking  Visit Diagnosis: Unsteadiness on feet  Abnormal posture  Localized swelling, mass and lump, neck  Chronic left shoulder pain  Stiffness of left shoulder joint  Low back pain with sciatica, sciatica laterality unspecified, unspecified back pain laterality, unspecified chronicity     Problem List Patient Active Problem List   Diagnosis Date Noted  . Pain from bone metastases ([place]) 09/11/2017  . Counseling regarding goals of care 04/16/2017  . Bone metastases ([place]) 02/28/2017  . Uncontrolled pain 02/28/2017  . Osteopenia determined by x-ray 09/20/2016  . Primary malignant neoplasm of left breast with stage 2 nodal metastasis per American Joint Committee on Cancer 7th edition (N2) ([place]) 02/14/2016  . Chemotherapy-induced neuropathy ([place]) 12/30/2015  . Insomnia 09/01/2015  . Malignant neoplasm of upper-outer quadrant of left breast in female, estrogen receptor positive ([place]) 08/08/2015   Donato Heinz. Owens Shark PT  Norwood Levo 09/17/2017, 5:10 PM  [place], [place], [address] Phone: [phone]   Fax:  [phone]  Name: Alison [name] MRN: [id number] Date of Birth: July 31, 1961

## 2017-09-17 NOTE — Telephone Encounter (Signed)
Oral Oncology Patient Advocate Encounter  Prior Authorization for Alison [name] has been approved.    PA# [id number] Effective dates: 09/16/2017 through 03/16/2018  Oral Oncology Clinic will continue to follow.   Fabio Asa. Alison [name], [name] [phone] 09/17/2017 8:46 AM

## 2017-09-18 ENCOUNTER — Ambulatory Visit: Payer: 59 | Admitting: Physical Therapy

## 2017-09-23 ENCOUNTER — Ambulatory Visit: Payer: 59 | Admitting: Physical Therapy

## 2017-09-23 ENCOUNTER — Other Ambulatory Visit: Payer: Self-pay | Admitting: *Deleted

## 2017-09-23 DIAGNOSIS — C50412 Malignant neoplasm of upper-outer quadrant of left female breast: Secondary | ICD-10-CM

## 2017-09-23 DIAGNOSIS — Z170 Estrogen receptor positive status [ER+]: Principal | ICD-10-CM

## 2017-09-23 MED ORDER — OXYCODONE HCL 15 MG PO TABS: 15.0000 mg | ORAL_TABLET | ORAL | 0 refills | Status: DC | PRN

## 2017-09-23 MED FILL — VERZENIO 100 MG TAB: 100 | 28 days supply | Qty: 28 | Fill #0

## 2017-09-30 ENCOUNTER — Encounter: Payer: 59 | Admitting: Physical Therapy

## 2017-10-01 ENCOUNTER — Ambulatory Visit: Payer: 59 | Attending: General Surgery | Admitting: Physical Therapy

## 2017-10-01 ENCOUNTER — Other Ambulatory Visit: Payer: Self-pay

## 2017-10-01 ENCOUNTER — Encounter: Payer: Self-pay | Admitting: Physical Therapy

## 2017-10-01 DIAGNOSIS — Z170 Estrogen receptor positive status [ER+]: Principal | ICD-10-CM

## 2017-10-01 DIAGNOSIS — R293 Abnormal posture: Secondary | ICD-10-CM | POA: Diagnosis present

## 2017-10-01 DIAGNOSIS — C50412 Malignant neoplasm of upper-outer quadrant of left female breast: Secondary | ICD-10-CM

## 2017-10-01 DIAGNOSIS — R296 Repeated falls: Secondary | ICD-10-CM | POA: Diagnosis present

## 2017-10-01 DIAGNOSIS — M25512 Pain in left shoulder: Secondary | ICD-10-CM | POA: Insufficient documentation

## 2017-10-01 DIAGNOSIS — M5440 Lumbago with sciatica, unspecified side: Secondary | ICD-10-CM

## 2017-10-01 DIAGNOSIS — R208 Other disturbances of skin sensation: Secondary | ICD-10-CM | POA: Diagnosis present

## 2017-10-01 DIAGNOSIS — M25612 Stiffness of left shoulder, not elsewhere classified: Secondary | ICD-10-CM

## 2017-10-01 DIAGNOSIS — R221 Localized swelling, mass and lump, neck: Secondary | ICD-10-CM | POA: Insufficient documentation

## 2017-10-01 DIAGNOSIS — R2681 Unsteadiness on feet: Secondary | ICD-10-CM | POA: Diagnosis not present

## 2017-10-01 DIAGNOSIS — G8929 Other chronic pain: Secondary | ICD-10-CM | POA: Insufficient documentation

## 2017-10-01 DIAGNOSIS — L599 Disorder of the skin and subcutaneous tissue related to radiation, unspecified: Secondary | ICD-10-CM | POA: Insufficient documentation

## 2017-10-01 MED ORDER — OXYCODONE HCL 15 MG PO TABS: 15.0000 mg | ORAL_TABLET | ORAL | 0 refills | Status: DC | PRN

## 2017-10-01 NOTE — Therapy (Signed)
[facility] [place], [place], [address] Phone: [phone]   Fax:  [phone]  Physical Therapy Treatment  Patient Details  Name: Alison [name] MRN: [id number] Date of Birth: Sep 12, 1960 Referring Provider: Gardenia Phlegm, NP   Encounter Date: 10/01/2017  PT End of Session - 10/01/17 1704    Visit Number  6 in 2019    Number of Visits  26    Date for PT Re-Evaluation  10/26/17    PT Start Time  1430    PT Stop Time  1515    PT Time Calculation (min)  45 min    Activity Tolerance  Patient tolerated treatment well    Behavior During Therapy  [facility] for tasks assessed/performed       Past Medical History:  Diagnosis Date  . Anxiety   . Asthma     seasonal asthma  . Breast cancer ([place])    metastatic breast cancer  . Complication of anesthesia 2007   aspiration pneumonia post hysterectomy.  Blood pressure would drop low- but no problems 01/2016- surgery  . History of blood transfusion   . History of mitral valve prolapse 1988   with palpitations  . History of radiation therapy 04/02/16- 05/21/16   Left Chest Wall, SCV, Axilla, IM nodes  . Hypertension    no meds  . Neuropathy    with chemo  . Osteopenia determined by x-ray   . Pneumonia    1980's and 1990's  . Raynaud's disease   . 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 Skin cancer 03/12/2014   basal- back    Past Surgical History:  Procedure Laterality Date  . ABDOMINAL HYSTERECTOMY  2007   with BSO  . APPENDECTOMY  2007  . AXILLARY LYMPH NODE DISSECTION Left 02/14/2016   Procedure: LEFT AXILLARY LYMPH NODE DISSECTION;  Surgeon: [name], MD;  Location: [facility];  Service: General;  Laterality: Left;  . BREAST RECONSTRUCTION WITH PLACEMENT OF TISSUE EXPANDER AND FLEX HD (ACELLULAR HYDRATED DERMIS) Left 02/02/2016   Procedure: IMMEDIATE LEFT BREAST RECONSTRUCTION WITH PLACEMENT OF TISSUE EXPANDER AND FLEX HD  (ACELLULAR HYDRATED DERMIS);  Surgeon: [name], DO;  Location: [facility];  Service: Plastics;  Laterality: Left;  . BREAST REDUCTION SURGERY Right 03/28/2017   Procedure: RIGHT MAMMARY REDUCTION  (BREAST);  Surgeon: [name], DO;  Location: [facility];  Service: Plastics;  Laterality: Right;  . BREAST SURGERY Left 02/02/16   mastectomy with reconstruction  . c-section  1991  . COLON RESECTION SIGMOID  12/02/2012   [facility]   . HERNIA REPAIR  2007  . LAPAROSCOPIC ENDOMETRIOSIS FULGURATION  1984, 1989  . MASTOPEXY Right 03/28/2017   Procedure: MASTOPEXY;  Surgeon: [name], DO;  Location: [facility];  Service: Plastics;  Laterality: Right;  . Port- a- cath placement  07/2015  . PORT-A-CATH REMOVAL Right 02/02/2016   Procedure: REMOVAL PORT-A-CATH;  Surgeon: [name], MD;  Location: [facility];  Service: General;  Laterality: Right;  . RADIOACTIVE SEED GUIDED AXILLARY SENTINEL LYMPH NODE Left 02/02/2016   Procedure: LEFT BREAST MASTECTOMY AND RADIOACTIVE SEED GUIDED LEFT SENTINEL LYMPH NODE BIOPSY ;  Surgeon: [name], MD;  Location: [facility];  Service: General;  Laterality: Left;  . REMOVAL OF TISSUE EXPANDER AND PLACEMENT OF IMPLANT Left 03/28/2017   Procedure: REMOVAL OF TISSUE EXPANDER AND PLACEMENT OF IMPLANT;  Surgeon: Marla Roe,  Loel Lofty, DO;  Location: [facility];  Service: Plastics;  Laterality: Left;    There were no vitals filed for this visit.  Subjective Assessment - 10/01/17 1444    Subjective  Pt states she had cool sculpting to both upper arms  at Dr. Lowell Bouton office yesterday.  They couldn't finish on her right arm because it was too small, but they could complete the procedure on the left arm.   She brings in some information about osteoporosis exercises today.  She plans to go to tai chi tomorrow. She has decided to get all her visits her instead at  vestibular center.     Pertinent History  Breast cancer diagnosed November 2016 She had chemotherapy. but did have some neuropathy so it had to be stopped. On June 8, she had a mastectomy with immedicate expander placement and with first set of lymphnodes removed, On June 20 she went for complete axillary lymph node dissection. She says she has had always been "full necked" but has neck and upper body fullness since the cancer was diagnosed. She reports she has osteopenia in low back and has had a fracture that was discovered in 2014 when she had a Dexa scan. Pt has had metastasis to the bones of the spine and recent  bilateral breast surgery for a reduction on the right and expander removal on the left with implant placement and removal     Patient Stated Goals  to improve her dizziness and prevent future falls    Currently in Pain?  Yes    Pain Score  2  pt has taken pain meds     Pain Location  Back    Pain Orientation  Left    Pain Descriptors / Indicators  Aching    Pain Type  Acute pain    Aggravating Factors   being more active             LYMPHEDEMA/ONCOLOGY QUESTIONNAIRE - 10/01/17 1449      Left Upper Extremity Lymphedema   10 cm Proximal to Olecranon Process  27 cm    Olecranon Process  23 cm    15 cm Proximal to Ulnar Styloid Process  22.7 cm    10 cm Proximal to Ulnar Styloid Process  20.2 cm    Just Proximal to Ulnar Styloid Process  14.7 cm    Across Hand at [employer]  17.5 cm    At [place] of 2nd Digit  5.7 cm               [facility] Adult PT Treatment/Exercise - 10/01/17 0001      Self-Care   Self-Care  Other Self-Care Comments    Other Self-Care Comments   reviewed exercises from osteoporosis booklet and reminded pt to do her basic core exercises and walking always remembering her body mechanics       Manual Therapy   Edema Management  remeasured her left arm circumference.      Manual Lymphatic Drainage (MLD)  briefly to left chest and axilla                 PT Short Term Goals - 09/04/17 2112      PT SHORT TERM GOAL #3   Title  Vestibular goal: pt will participate in further assessment of gait with FGA and gait velocity    Time  4    Period  Weeks    Status  New    Target Date  10/04/17      PT SHORT TERM GOAL #4   Title  Vestibular goal: pt will participate in motion sensitivity and SOT testing with habituation HEP to be initiated    Time  4    Period  Weeks    Status  New    Target Date  10/04/17      Short Term Clinic Goals - 08/28/17 1118      CC Short Term Goal  #1   Title  Pt will report she knows strategies for good body mechanics to decrease low back pain     Status  Achieved      CC Short Term Goal  #2   Title  Pt will have a deacrease in circumference 15 cm prox to ulnar styloid of left forearm by 1 cm     Baseline  25 cm , on 05/16/2017 it is 23.5    Status  Achieved      CC Short Term Goal  #3   Title  Pt will report she knows how to manage left UE lymphedema with use of compression, elevation and exercise, self manual lymph drainage and possibly Flexitouch     Baseline  Pt has day and nighttime garments and Flexitouch     Status  Achieved       PT Long Term Goals - 09/04/17 2115      PT LONG TERM GOAL #3   Title  VESTIBULAR GOALS: Pt will demonstrate independence with vestibular and balance HEP    Time  8    Period  Weeks    Status  New    Target Date  11/03/17      PT LONG TERM GOAL #4   Title  Pt will report >/= 10 point improvement in overall function (as reported on FOTO) by D/C    Baseline  73% (27% Limited)    Time  8    Period  Weeks    Status  New    Target Date  11/03/17      PT LONG TERM GOAL #5   Title  Pt will report decrease in symptom severity to </= 1/5 on MSQ    Time  8    Period  Weeks    Status  New    Target Date  11/03/17      Additional Long Term Goals   Additional Long Term Goals  Yes      PT LONG TERM GOAL #6   Title  Pt will improve FGA by 8 points  to decrease falls risk    Time  8    Period  Weeks    Status  New    Target Date  11/03/17      PT LONG TERM GOAL #7   Title  Pt will improve gait velocity to >3.6 ft/sec     Time  8    Period  Weeks    Status  New    Target Date  11/03/17      PT LONG TERM GOAL #8   Title  Pt will improve SOT composite score by 10 points    Time  8    Period  Weeks    Status  New    Target Date  11/03/17        Long Term Clinic Goals - 08/28/17 1119      CC Long Term Goal  #1   Title  Patient will report  that she can manage back  pain  so she can perform daily activities with greater ease    Status  Achieved      CC Long Term Goal  #2   Title  Pt will  have given the TENS unit a trial to see if it will help with back pain     Baseline  has not needed to use TENS     Status  Deferred      CC Long Term Goal  #3   Title  Pt will be independent in core and  LE strengthening exercises       CC Long Term Goal  #4   Title  Pt will report low back pain is decreased to a 4/10 so that she can perform activities of daily living easier     Baseline  It depends on the day , but pt needs to take breaks and knows how to manage her pain, she feels this goal is achieved     Status  Achieved         Plan - 10/01/17 1704    Clinical Impression Statement  Pt reports she thinks she is doing better overall, but that she has noticed increased back pain with increased activity.  Reviewed exercise and body mechanics.  Pt continues with pain and visible swelling in left axilla with adherent scar on left chest, but arm circumferences are stable.     Clinical Impairments Affecting Rehab Potential  mulitple areas of diffuse bony metastasis, chemotherapy with neuropathy , radiation to back  and chest,    PT Treatment/Interventions  ADLs/Self Care Home Management;Manual lymph drainage;Compression bandaging;Scar mobilization;Passive range of motion;Patient/family education;Taping;Manual techniques;Therapeutic  exercise;Therapeutic activities;Orthotic Fit/Training;DME Instruction;Electrical Stimulation;Moist Heat;Gait training;Stair training;Functional mobility training;Balance training;Neuromuscular re-education;Vestibular    PT Next Visit Plan  Try TENS unit to see if it makes a difference in pain when pt is ative. contiue with exrcise for aerobics and LE strenth or Focus on myofascial work to left chest and manual lymph draiange to left axilla contine with kinesiotape prn;     Consulted and Agree with Plan of Care  Patient       Patient will benefit from skilled therapeutic intervention in order to improve the following deficits and impairments:  Decreased range of motion, Impaired UE functional use, Decreased activity tolerance, Decreased knowledge of precautions, Decreased skin integrity, Impaired perceived functional ability, Pain, Decreased knowledge of use of DME, Increased edema, Postural dysfunction, Decreased strength, Impaired sensation, Decreased scar mobility, Decreased mobility, Increased muscle spasms, Dizziness, Increased fascial restricitons, Decreased balance, Difficulty walking  Visit Diagnosis: Unsteadiness on feet  Abnormal posture  Localized swelling, mass and lump, neck  Chronic left shoulder pain  Stiffness of left shoulder joint  Low back pain with sciatica, sciatica laterality unspecified, unspecified back pain laterality, unspecified chronicity     Problem List Patient Active Problem List   Diagnosis Date Noted  . Pain from bone metastases ([place]) 09/11/2017  . Counseling regarding goals of care 04/16/2017  . Bone metastases ([place]) 02/28/2017  . Uncontrolled pain 02/28/2017  . Osteopenia determined by x-ray 09/20/2016  . Primary malignant neoplasm of left breast with stage 2 nodal metastasis per American Joint Committee on Cancer 7th edition (N2) ([place]) 02/14/2016  . Chemotherapy-induced neuropathy ([place]) 12/30/2015  . Insomnia 09/01/2015  . Malignant neoplasm of  upper-outer quadrant of left breast in female, estrogen receptor positive ([place]) 08/08/2015   Donato Heinz. Owens Shark, PT  Norwood Levo 10/01/2017, 5:08 PM  [place] [phone]  [place], [place], [address] Phone: [phone]   Fax:  [phone]  Name: Alison [name] MRN: [id number] Date of Birth: Jul 02, 1961

## 2017-10-01 NOTE — Telephone Encounter (Signed)
Spoke with patient regarding pain medication refill. Dr. [name] would like to see pt to discuss pain management. We have scheduled her for Thursday at 230p. We are going to refill her Oxy IR 15mg  tab for 24 tablets as of now.  Cyndia Bent RN

## 2017-10-02 NOTE — Progress Notes (Signed)
[place]  [phone] [phone]   ID: Alison [name] DOB: 1961/06/03  MR#: [id number]  SKA#:[id number]  Patient Care Team: [name], MD as PCP - General (Internal Medicine) Erroll Luna, MD as Consulting Physician (General Surgery) Terriann Difonzo, Virgie Dad, MD as Consulting Physician (Oncology) Aloha Gell, MD as Consulting Physician (Obstetrics and Gynecology) Harriett Sine, MD as Consulting Physician (Dermatology) Benson Norway, RN as Registered Nurse (Oncology) Eppie Gibson, MD as Attending Physician (Radiation Oncology) PCP: [name], MD OTHER MD:  CHIEF COMPLAINT: Estrogen receptor positive breast cancer  CURRENT TREATMENT: fulvestrant, abemaciclib, denosumab/Xgeva  INTERVAL HISTORY: Alison [name] returns today for follow-up and treatment of her 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 She continues on anastrozole, with good tolerance. She notes that she continues to take gabapentin in order to prevent night time hot flashes. She denies issues with vaginal dryness.   She also continues on fulvestrant and Xgeva every 4 weeks,she tolerates this well.   She also continues on abemaciclib two weeks on and two weeks off, with good tolerance. She notes that her hair is thinning, and she is a little fatigued, but it is manageable.    REVIEW OF SYSTEMS: Alison [name] reports that she has been going out to lunch and spending time with her friends. She notes that she is reorganizing her closet. She reports that she is going to a [place] with her husband for the weekend. She notes that she is sleeping good. She says that if she is nauseated, she takes compazine more often than she takes Zofran. She notes that she takes Zofran if she is really nauseated, otherwise, it gives her a headache. She notes that she is taking 160 mg of oxycontin 3 times a day for her back pain. She also takes oxycodone 3 times a day. She denies having  issues with constipation. She denies unusual headaches, visual changes, nausea, vomiting, or dizziness. There has been no unusual cough, phlegm production, or pleurisy. This been no change in bowel or bladder habits. She denies unexplained fatigue or unexplained weight loss, bleeding, rash, or fever. A detailed review of systems was otherwise stable.       BREAST CANCER HISTORY: From the original intake note:  Alison [name] had routine screening mammography with tomography [facility] February 20 11/14/2014 showing a possible mass in the right breast. Diagnostic right mammography with right breast ultrasonography 11/01/2014 found the breast density to be category C. In the upper outer quadrant of the right breast there was a partially obscured mass which on physical exam was palpable as an area of thickening at the 10:00 position 8 cm from the nipple. Ultrasound showed multiple cysts in the upper outer right breast corresponding to the mass in question.  In November 2016 the patient felt a change in the upper left breast and brought it to her primary care physician's attention. Diagnostic left mammography with tomosynthesis and left breast ultrasonography at the [facility] December 01/14/2015 found trabecular thickening throughout the left breast with skin thickening, but no circumscribed mass or suspicious calcifications. A rounded mass in the left axilla measured 6 mm. There was an additional mildly prominent lymph node in the left axilla which was what the patient had been palpating. Ultrasonography showed ill-defined swelling throughout the inner left breast with overlying skin thickening.. At the 2:00 position 4 cm from the nipple there was an irregular hypoechoic mass measuring 2.3 cm. A second mass at the 1:00 area 3 cm from  the nipple measured 1 cm. The distance between these 2 masses was 1.8 cm. There was also a round hypoechoic left axillary mass measuring 0.6 cm. Overall the was an area of 3.7  cm of mixed echogenicity corresponding to the area of palpable soft tissue swelling.   On 08/04/2015 the patient underwent biopsy of the mass at the 2:00 axis 4 cm from the nipple and a second biopsy was performed of the hypoechoic mass at the 1:00 position 3 cm from the nipple. Biopsy of the suspicious nodule in the lower left axilla was attempted but could not be completed as the mass became obscured after administration of lidocaine.  The pathology from both left breast biopsies 08/04/2015 ([id number]) showed invasive ductal carcinoma, grade 3. The prognostic profile obtained from the larger tumor showed estrogen receptor to be strongly positive at 95%, progesterone receptor positive at 30%, with an MIB-1 of 12%, and no HER-2 amplification, the signals ratio being 1.22 and the number per cell 2.20.  On 08/09/2015 the patient underwent biopsy of this suspicious left axillary lymph node noted above, and this showed ([id number]) metastatic carcinoma with extracapsular extension.  The patient's subsequent history is as detailed below    PAST MEDICAL HISTORY: Past Medical History:  Diagnosis Date  . Anxiety   . Asthma     seasonal asthma  . Breast cancer ([place])    metastatic breast cancer  . Complication of anesthesia 2007   aspiration pneumonia post hysterectomy.  Blood pressure would drop low- but no problems 01/2016- surgery  . History of blood transfusion   . History of mitral valve prolapse 1988   with palpitations  . History of radiation therapy 04/02/16- 05/21/16   Left Chest Wall, SCV, Axilla, IM nodes  . Hypertension    no meds  . Neuropathy    with chemo  . Osteopenia determined by x-ray   . Pneumonia    1980's and 1990's  . Raynaud's disease   . 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 Skin cancer 03/12/2014   basal- back    PAST SURGICAL HISTORY: Past Surgical History:  Procedure Laterality Date  . ABDOMINAL HYSTERECTOMY  2007    with BSO  . APPENDECTOMY  2007  . AXILLARY LYMPH NODE DISSECTION Left 02/14/2016   Procedure: LEFT AXILLARY LYMPH NODE DISSECTION;  Surgeon: [name], MD;  Location: [facility];  Service: General;  Laterality: Left;  . BREAST RECONSTRUCTION WITH PLACEMENT OF TISSUE EXPANDER AND FLEX HD (ACELLULAR HYDRATED DERMIS) Left 02/02/2016   Procedure: IMMEDIATE LEFT BREAST RECONSTRUCTION WITH PLACEMENT OF TISSUE EXPANDER AND FLEX HD (ACELLULAR HYDRATED DERMIS);  Surgeon: [name], DO;  Location: [facility];  Service: Plastics;  Laterality: Left;  . BREAST REDUCTION SURGERY Right 03/28/2017   Procedure: RIGHT MAMMARY REDUCTION  (BREAST);  Surgeon: [name], DO;  Location: [facility];  Service: Plastics;  Laterality: Right;  . BREAST SURGERY Left 02/02/16   mastectomy with reconstruction  . c-section  1991  . COLON RESECTION SIGMOID  12/02/2012   [facility]   . HERNIA REPAIR  2007  . LAPAROSCOPIC ENDOMETRIOSIS FULGURATION  1984, 1989  . MASTOPEXY Right 03/28/2017   Procedure: MASTOPEXY;  Surgeon: [name], DO;  Location: [facility];  Service: Plastics;  Laterality: Right;  . Port- a- cath placement  07/2015  . PORT-A-CATH REMOVAL Right 02/02/2016   Procedure: REMOVAL PORT-A-CATH;  Surgeon: [name], MD;  Location: [facility];  Service: General;  Laterality: Right;  . RADIOACTIVE SEED GUIDED AXILLARY SENTINEL LYMPH NODE Left 02/02/2016   Procedure: LEFT BREAST MASTECTOMY AND RADIOACTIVE SEED GUIDED LEFT SENTINEL LYMPH NODE BIOPSY ;  Surgeon: [name], MD;  Location: [facility];  Service: General;  Laterality: Left;  . REMOVAL OF TISSUE EXPANDER AND PLACEMENT OF IMPLANT Left 03/28/2017   Procedure: REMOVAL OF TISSUE EXPANDER AND PLACEMENT OF IMPLANT;  Surgeon: [name], DO;  Location: [facility];  Service: Plastics;  Laterality: Left;    FAMILY HISTORY Family History  Problem  Relation Age of Onset  . Lung cancer Father    the patient's father died from lung cancer at the age of 66 in the setting of tobacco abuse. The patient's mother died at the age of 30 with pancreatic cancer. The patient had no siblings. There is no history of breast or ovarian cancer in the family.  GYNECOLOGIC HISTORY:  No LMP recorded. Patient has had a hysterectomy. Menarche age 23, first live birth age 65. The patient is GX P1. She is status post total hysterectomy with bilateral salpingo-oophorectomy. She has been on hormone replacement since that time and is tapering to off at the time of this dictation.   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 She used to work at [employer]. Her husband Alison [name] works for [employer] division at a [employer] their son Alison [name] is currently at home.    ADVANCED DIRECTIVES: Not in place   HEALTH MAINTENANCE: Social History   Tobacco Use  . Smoking status: Former Smoker    Packs/day: 1.00    Years: 10.00    Pack years: 10.00    Types: Cigarettes    Last attempt to quit: 11/25/2008    Years since quitting: 8.8  . Smokeless tobacco: Never Used  . Tobacco comment: on and off smoker never consistent  Substance Use Topics  . Alcohol use: Yes    Comment: social  . Drug use: No     Colonoscopy: April 2014/ Ray[facility]do [facility]  PAP: s/p hysterectomy  Bone density: August 2016/ osteopenia  Lipid panel:  Allergies  Allergen Reactions  . Sulfa Antibiotics Other (See Comments)    Blisters in mouth    Current Outpatient Medications  Medication Sig Dispense Refill  . abemaciclib (VERZENIO) 100 MG tablet Take 1 tablet (100 mg total) by mouth 2 (two) times daily. Pt to take 2 weeks on then off x 2 weeks 30 tablet 3  . anastrozole (ARIMIDEX) 1 MG tablet TAKE ONE TABLET BY MOUTH ONCE DAILY 90 tablet 3  . Ascorbic Acid (VITAMIN C) 1000 MG tablet Take 1,000 mg by mouth 2 (two) times daily.    . cetirizine (ZYRTEC) 10 MG  tablet Take 10 mg by mouth daily.    . Cholecalciferol (VITAMIN D3) 10000 units capsule Take 10,000 Units by mouth daily.     Marland Kitchen escitalopram (LEXAPRO) 20 MG tablet Take 1 tablet (20 mg total) by mouth daily. 90 tablet 4  . fluticasone (FLONASE) 50 MCG/ACT nasal spray Place 2 sprays into both nostrils daily. Reported on 11/18/2015    . gabapentin (NEURONTIN) 300 MG capsule Take 1 capsule (300 mg total) by mouth at bedtime as needed. Take 2 tablets in AM, 2 tablets mid-day, and 4 tablets at bedtime 90 capsule 4  . LORazepam (ATIVAN) 0.5 MG tablet Take 1 tablet (0.5 mg total) by mouth at bedtime. 30 tablet 3  .  Multiple Vitamin (MULTIVITAMIN) tablet Take 1 tablet by mouth daily.    . ondansetron (ZOFRAN) 8 MG tablet Take 1 tablet (8 mg total) by mouth every 8 (eight) hours as needed for nausea or vomiting. 20 tablet 5  . oxyCODONE (OXYCONTIN) 80 mg 12 hr tablet Take 2 tablets (160 mg total) by mouth every 8 (eight) hours. 120 tablet 0  . oxyCODONE (ROXICODONE) 15 MG immediate release tablet Take 1-2 tablets (15-30 mg total) by mouth every 4 (four) hours as needed for pain. 24 tablet 0  . Probiotic Product (PROBIOTIC DAILY PO) Take 1 capsule by mouth daily. [place] prochlorperazine (COMPAZINE) 10 MG tablet Take 1 tablet (10 mg total) by mouth every 6 (six) hours as needed (Nausea or vomiting). 90 tablet 2  . traZODone (DESYREL) 50 MG tablet TAKE 3 TABLETS AT BEDTIME 90 tablet 2  . UNABLE TO FIND [place]    . UNABLE TO FIND [place] mushroom    . UNABLE TO FIND [place] Grifolafrodre    . zolpidem (AMBIEN) 5 MG tablet Take 1 tablet (5 mg total) by mouth at bedtime as needed for sleep. for sleep 30 tablet 2   No current facility-administered medications for this visit.     OBJECTIVE: Middle-aged white woman who appears well  Vitals:   10/03/17 1410  BP: (!) 160/98  Pulse: (!) 110  Resp: 18  Temp: 98.5 F (36.9 C)  SpO2: 98%     Body mass index is 27.18  kg/m.    ECOG FS:0 - Asymptomatic   Sclerae unicteric, pupils round and equal Oropharynx clear and moist No cervical or supraclavicular adenopathy Lungs no rales or rhonchi Heart regular rate and rhythm Abd soft, nontender, positive bowel sounds MSK no focal spinal tenderness, no upper extremity lymphedema Neuro: nonfocal, well oriented, appropriate affect Breasts: The right breast is benign.  The left breast is status post mastectomy, with no evidence of chest wall recurrence.  Both axillae are benign.   LAB RESULTS:  CMP     Component Value Date/Time   NA 138 09/11/2017 1445   NA 138 08/14/2017 1042   K 4.4 09/11/2017 1445   K 4.2 08/14/2017 1042   CL 102 09/11/2017 1445   CO2 25 09/11/2017 1445   CO2 26 08/14/2017 1042   GLUCOSE 93 09/11/2017 1445   GLUCOSE 129 08/14/2017 1042   BUN 10 09/11/2017 1445   BUN 8.3 08/14/2017 1042   CREATININE 0.74 09/11/2017 1445   CREATININE 0.8 08/14/2017 1042   CALCIUM 9.2 09/11/2017 1445   CALCIUM 9.6 08/14/2017 1042   PROT 7.1 09/11/2017 1445   PROT 6.5 08/14/2017 1042   ALBUMIN 3.7 09/11/2017 1445   ALBUMIN 3.4 (L) 08/14/2017 1042   AST 45 (H) 09/11/2017 1445   AST 63 (H) 08/14/2017 1042   ALT 42 09/11/2017 1445   ALT 53 08/14/2017 1042   ALKPHOS 97 09/11/2017 1445   ALKPHOS 84 08/14/2017 1042   BILITOT 0.4 09/11/2017 1445   BILITOT 0.44 08/14/2017 1042   GFRNONAA >60 09/11/2017 1445   GFRAA >60 09/11/2017 1445    INo results found for: SPEP, UPEP  Lab Results  Component Value Date   WBC 3.6 (L) 09/11/2017   NEUTROABS 2.2 09/11/2017   HGB 12.0 09/11/2017   HCT 35.6 09/11/2017   MCV 100.8 09/11/2017   PLT 160 09/11/2017      Chemistry      Component Value Date/Time  NA 138 09/11/2017 1445   NA 138 08/14/2017 1042   K 4.4 09/11/2017 1445   K 4.2 08/14/2017 1042   CL 102 09/11/2017 1445   CO2 25 09/11/2017 1445   CO2 26 08/14/2017 1042   BUN 10 09/11/2017 1445   BUN 8.3 08/14/2017 1042   CREATININE 0.74  09/11/2017 1445   CREATININE 0.8 08/14/2017 1042      Component Value Date/Time   CALCIUM 9.2 09/11/2017 1445   CALCIUM 9.6 08/14/2017 1042   ALKPHOS 97 09/11/2017 1445   ALKPHOS 84 08/14/2017 1042   AST 45 (H) 09/11/2017 1445   AST 63 (H) 08/14/2017 1042   ALT 42 09/11/2017 1445   ALT 53 08/14/2017 1042   BILITOT 0.4 09/11/2017 1445   BILITOT 0.44 08/14/2017 1042       No results found for: LABCA2  No components found for: LABCA125  No results for input(s): INR in the last 168 hours.  Urinalysis    Component Value Date/Time   COLORURINE YELLOW 11/12/2015 [place] 11/12/2015 0837   LABSPEC 1.005 03/18/2017 1223   PHURINE 6.0 03/18/2017 1223   PHURINE 6.5 11/12/2015 0837   GLUCOSEU Negative 03/18/2017 1223   HGBUR Large 03/18/2017 1223   HGBUR MODERATE (A) 11/12/2015 0837   BILIRUBINUR Negative 03/18/2017 1223   KETONESUR Negative 03/18/2017 [place] 11/12/2015 0837   PROTEINUR Negative 03/18/2017 1223   PROTEINUR NEGATIVE 11/12/2015 0837   UROBILINOGEN 0.2 03/18/2017 1223   NITRITE Negative 03/18/2017 1223   NITRITE NEGATIVE 11/12/2015 0837   LEUKOCYTESUR Moderate 03/18/2017 1223    STUDIES: Nm Pet Image Restag (ps) Skull Base To Thigh  Result Date: 09/05/2017 CLINICAL DATA:  Subsequent treatment strategy for breast carcinoma. Bone metastasis. No visceral disease. EXAM: NUCLEAR MEDICINE PET SKULL BASE TO THIGH TECHNIQUE: 8.5 mCi F-18 FDG was injected intravenously. Full-ring PET imaging was performed from the skull base to thigh after the radiotracer. CT data was obtained and used for attenuation correction and anatomic localization. FASTING BLOOD GLUCOSE:  Value: 72 mg/dl COMPARISON:  PET-CT 02/22/2017 FINDINGS: NECK No hypermetabolic lymph nodes in the neck. CHEST Post LEFT mastectomy anatomy. Mild postsurgical inflammation in the LEFT chest wall. No hypermetabolic axillary lymph nodes. Axillary nodal dissection clips in the LEFT  axilla. No hypermetabolic supraclavicular infraclavicular nodes. No hypermetabolic or enlarged internal mammary nodes. There is linear thickening in the LEFT upper lobe unchanged from comparison exam and with mild metabolic activity. This is favored post radiation pulmonary parenchymal change. No suspicious nodularity. No hypermetabolic mediastinal lymph nodes or hilar nodes ABDOMEN/PELVIS No abnormal hypermetabolic activity within the liver, pancreas, adrenal glands, or spleen. No hypermetabolic lymph nodes in the abdomen or pelvis. Post hysterectomy. SKELETON Multiple skeletal metastasis again demonstrated. Photopenia through the lower lumbar spine consistent radiation therapy. Example lesions in the pelvis are not changed in metabolic activity. For example lesion in the LEFT iliac wing with SUV max equal 5.3 compared to 5.6. Lesion in the ribs and scapula unchanged. For example lesion the inferior aspect of the RIGHT scapula with SUV max equal 3.4 compared to 3.9. More confluent hypermetabolic marrow activity through the thoracic spine with individual lesions demonstrating similar metabolic activity. IMPRESSION: 1. Widespread skeletal metastasis with no convincing evidence progression. Increased uniform marrow activity in the thoracic spine may represent anemia response. 2. Decreased activity of metastatic lesions in lumbar spine related to radiation therapy. 3. No evidence of breast cancer metastasis to the soft tissues or viscera. 4. No  evidence local recurrence in the LEFT breast or axilla. Electronically Signed   By: Suzy Bouchard M.D.   On: 09/05/2017 16:45     PROTOCOLS:  participated in [place], ABC and PALLAS studies, currently off study   ASSESSMENT: 57 y.o. [place] woman status post biopsy of 2 separate masses in the left breast upper outer quadrant 08/04/2015, clinically mT2 N1, stage IIb/IIIa invasive ductal carcinoma, grade 3, the larger mass being estrogen and progesterone receptor  positive, HER-2 negative, with an MIB-1 of 12%  (a) biopsy of a left axillary lymph node 08/09/2015 was positive, with extracapsular extension  (1) neoadjuvant chemotherapy started 08/25/2015 consisting of doxorubicin and cyclophosphamide in dose dense fashion 4, completed 10/06/2015,  followed by weekly paclitaxel 10 completed 12/23/2015  (a) final 2 planned cycles of weekly paclitaxel omitted because of neuropathy concerns  (2) status post left mastectomy with targeted axillary dissection 02/02/2016 for a ypT2 ypN2a, stage IIIa invasive ductal carcinoma, grade 2, with negative margins, repeat prognostic panel showing estrogen receptor positive, but progesterone receptor and HER-2 negative  (a) completion axillary lymph node dissection 02/14/2016 showed an additional 2 out of 8 lymph nodes to be involved (final count 6 out of 10 lymph nodes positive  (b) expander placement at the time of left mastectomy  (3) adjuvant capecitabine starting concurrently with radiation--discontinued 05/21/2016  (4) adjuvant  radiation started 04/02/2016, completed 05/21/2016  (5) anastrozole started October 2017, discontinued January 2019  (a) bone density August 2016 showed osteopenia  (b) repeat bone density 09/07/2016 shows a T score of -1.7.  (c) the patient is status post total abdominal hysterectomy with bilateral salpingo-oophorectomy.  (6) postoperative pain?: Starting OxyContin 20 mg twice a day with oxycodone as needed for breakthrough pain 03/02/2016  (a) failed transition to tramadol, gabapentin, naproxen   (b) started methadone 5 mg TID as of 12/20/2016-- not tolerated  (c) robaxin added June 2018 to NSAIDS and tylenol  (d) on OxyContin and Oxycodone as discussed below  (7) Research:  (a) B-WEL study ([facility] [id number])--enrolled in Arm 1 (received education)  (b) PALLAS AFT-05 study: randomized to treatment, started palbociclib 07/26/2016   (i) dose reduced to 100 mg, then to 75 mg because of  cytopenias   (ii) went off study study May 2018 because of persistent cytopenias  (c) taxane study 12/20/2016  (d) aspirin study  (e) went off studies as metastatic disease developed in June 2018  METASTATIC DISEASE 02/15/2017: CT scan of the lumbar spine and 02/27/2017 CT of the thoracic and cervical spine shows multiple bone lesions but no evidence of cord compromise; bone lesions are confirmed on PET scan 02/22/2017 which however shows no evidence of visceral disease  (a) CA-27-29 is noninformative  (b) repeat PET scan 09/05/2017 shows again no visual disease, stable bony metastases  (8) continuing anastrozole, abemaciclib added 02/22/2017, fulvestrant started 02/22/2017 ,   (A) anastrozole discontinued January 2019  (9) yearly zolendronate given 09/26/2016, changed to monthly denosumab/Xgeva starting 03/19/2017  (10) radiation 04/01/2017 - 04/23/2017 Site/dose:   The lumbar spine was treated to 35 Gy in 14 fractions of 2.5 Gy.   Other issues: (a) poorly controlled pain: Currently on OxyContin 160 grams p.o. TID/ oxycodone 20-40 mg Q 4h PRN  (i) bowel prophylaxis in place  (b) left upper lobe changes noted on PET scan 02/22/2017 felt to be secondary to radiation  PLAN: Alison [name] is tolerating her current treatment remarkably well and her PET scan is stable.  It is very favorable that there is no  visceral disease.  It has been difficult to bring her pain under control.  As we have discussed, the goal of pain control is not to have no pain, but to be able to function as normally as possible on whatever dose of pain medication is necessary to do that.  Today Alison [name] looked the best she has looked to me for a long time.  She was relaxed, moved easily, and was not anxious and stressed.  It could be that we have finally found the right dose of narcotics for her.  This is very high, but she tolerates it with no side effects and she has an effective bowel prophylaxis in place.  They are going  to be in [place] for a week.  I made sure to refill her narcotics and her lorazepam so she would not run out in a different city.  She will return next week for her next Faslodex and denosumab injections, and then she will see me 4 weeks later.  She knows to call for any other issues that may develop before the next visit.  Alison [name], Virgie Dad, MD  10/03/17 2:42 PM Medical Oncology and Hematology [facility] [address] [place], [place] [address] Tel. [phone]    Fax. [phone]  This document serves as a record of services personally performed by Lurline Del, MD. It was created on his behalf by Sheron Nightingale, a trained medical scribe. The creation of this record is based on the scribe's personal observations and the provider's statements to them.   I have reviewed the above documentation for accuracy and completeness, and I agree with the above.

## 2017-10-03 ENCOUNTER — Inpatient Hospital Stay: Payer: 59 | Attending: Oncology | Admitting: Oncology

## 2017-10-03 ENCOUNTER — Encounter: Payer: 59 | Admitting: Physical Therapy

## 2017-10-03 ENCOUNTER — Telehealth: Payer: Self-pay | Admitting: Oncology

## 2017-10-03 VITALS — BP 160/98 | HR 110 | Temp 98.5°F | Resp 18 | Ht 62.0 in | Wt 148.6 lb

## 2017-10-03 DIAGNOSIS — Z800 Family history of malignant neoplasm of digestive organs: Secondary | ICD-10-CM

## 2017-10-03 DIAGNOSIS — C779 Secondary and unspecified malignant neoplasm of lymph node, unspecified: Secondary | ICD-10-CM

## 2017-10-03 DIAGNOSIS — C7951 Secondary malignant neoplasm of bone: Secondary | ICD-10-CM | POA: Insufficient documentation

## 2017-10-03 DIAGNOSIS — Z923 Personal history of irradiation: Secondary | ICD-10-CM

## 2017-10-03 DIAGNOSIS — Z87891 Personal history of nicotine dependence: Secondary | ICD-10-CM

## 2017-10-03 DIAGNOSIS — G620 Drug-induced polyneuropathy: Secondary | ICD-10-CM | POA: Diagnosis not present

## 2017-10-03 DIAGNOSIS — Z5111 Encounter for antineoplastic chemotherapy: Secondary | ICD-10-CM | POA: Insufficient documentation

## 2017-10-03 DIAGNOSIS — Z801 Family history of malignant neoplasm of trachea, bronchus and lung: Secondary | ICD-10-CM | POA: Diagnosis not present

## 2017-10-03 DIAGNOSIS — Z79899 Other long term (current) drug therapy: Secondary | ICD-10-CM | POA: Diagnosis not present

## 2017-10-03 DIAGNOSIS — C50412 Malignant neoplasm of upper-outer quadrant of left female breast: Secondary | ICD-10-CM

## 2017-10-03 DIAGNOSIS — Z79811 Long term (current) use of aromatase inhibitors: Secondary | ICD-10-CM

## 2017-10-03 DIAGNOSIS — T451X5A Adverse effect of antineoplastic and immunosuppressive drugs, initial encounter: Secondary | ICD-10-CM

## 2017-10-03 DIAGNOSIS — Z170 Estrogen receptor positive status [ER+]: Secondary | ICD-10-CM

## 2017-10-03 DIAGNOSIS — C50912 Malignant neoplasm of unspecified site of left female breast: Secondary | ICD-10-CM

## 2017-10-03 MED ORDER — LORAZEPAM 0.5 MG PO TABS: 0.5000 mg | ORAL_TABLET | Freq: Every day | ORAL | 3 refills | Status: AC

## 2017-10-03 MED ORDER — OXYCODONE HCL 20 MG PO TABS: 20.0000 mg | ORAL_TABLET | ORAL | 0 refills | Status: DC | PRN

## 2017-10-03 MED ORDER — OXYCODONE HCL ER 80 MG PO T12A: 160.0000 mg | EXTENDED_RELEASE_TABLET | Freq: Three times a day (TID) | ORAL | 0 refills | Status: DC

## 2017-10-03 MED ORDER — LORAZEPAM 0.5 MG PO TABS: 0.5000 mg | ORAL_TABLET | Freq: Every day | ORAL | 3 refills | Status: DC

## 2017-10-03 NOTE — Telephone Encounter (Signed)
Gave patient AVs and calendar of upcoming March appointments. °

## 2017-10-07 ENCOUNTER — Encounter: Payer: 59 | Admitting: Physical Therapy

## 2017-10-08 ENCOUNTER — Encounter: Payer: 59 | Admitting: Physical Therapy

## 2017-10-09 ENCOUNTER — Inpatient Hospital Stay: Payer: 59

## 2017-10-09 VITALS — BP 150/96 | HR 104 | Temp 98.2°F | Resp 20

## 2017-10-09 DIAGNOSIS — M8580 Other specified disorders of bone density and structure, unspecified site: Secondary | ICD-10-CM

## 2017-10-09 DIAGNOSIS — C50412 Malignant neoplasm of upper-outer quadrant of left female breast: Secondary | ICD-10-CM

## 2017-10-09 DIAGNOSIS — C50912 Malignant neoplasm of unspecified site of left female breast: Secondary | ICD-10-CM

## 2017-10-09 DIAGNOSIS — Z5111 Encounter for antineoplastic chemotherapy: Secondary | ICD-10-CM | POA: Diagnosis not present

## 2017-10-09 DIAGNOSIS — C779 Secondary and unspecified malignant neoplasm of lymph node, unspecified: Secondary | ICD-10-CM

## 2017-10-09 DIAGNOSIS — Z170 Estrogen receptor positive status [ER+]: Secondary | ICD-10-CM

## 2017-10-09 LAB — COMPREHENSIVE METABOLIC PANEL
ALT: 37 U/L (ref 0–55)
AST: 46 U/L — AB (ref 5–34)
Albumin: 3.6 g/dL (ref 3.5–5.0)
Alkaline Phosphatase: 98 U/L (ref 40–150)
Anion gap: 11 (ref 3–11)
BUN: 12 mg/dL (ref 7–26)
CALCIUM: 8.8 mg/dL (ref 8.4–10.4)
CHLORIDE: 104 mmol/L (ref 98–109)
CO2: 25 mmol/L (ref 22–29)
CREATININE: 0.87 mg/dL (ref 0.60–1.10)
GFR calc non Af Amer: >60 mL/min (ref >60)
GLUCOSE: 112 mg/dL (ref 70–140)
Potassium: 4.1 mmol/L (ref 3.5–5.1)
SODIUM: 140 mmol/L (ref 136–145)
Total Bilirubin: 0.4 mg/dL (ref 0.2–1.2)
Total Protein: 7 g/dL (ref 6.4–8.3)

## 2017-10-09 LAB — CBC WITH DIFFERENTIAL/PLATELET
BASOS ABS: 0 10*3/uL (ref 0.0–0.1)
Basophils Relative: 1 %
EOS ABS: 0.1 10*3/uL (ref 0.0–0.5)
EOS PCT: 5 %
HCT: 35.6 % (ref 34.8–46.6)
HEMOGLOBIN: 12.1 g/dL (ref 11.6–15.9)
LYMPHS ABS: 0.6 10*3/uL — AB (ref 0.9–3.3)
LYMPHS PCT: 25 %
MCH: 34 pg (ref 25.1–34.0)
MCHC: 33.9 g/dL (ref 31.5–36.0)
MCV: 100.2 fL (ref 79.5–101.0)
Monocytes Absolute: 0.2 10*3/uL (ref 0.1–0.9)
Monocytes Relative: 8 %
Neutro Abs: 1.5 10*3/uL (ref 1.5–6.5)
Neutrophils Relative %: 61 %
PLATELETS: 212 10*3/uL (ref 145–400)
RBC: 3.55 MIL/uL — AB (ref 3.70–5.45)
RDW: 13.5 % (ref 11.2–14.5)
WBC: 2.5 10*3/uL — AB (ref 3.9–10.3)

## 2017-10-09 MED ORDER — DENOSUMAB 120 MG/1.7ML ~~LOC~~ SOLN
120.0000 mg | Freq: Once | SUBCUTANEOUS | Status: AC
  Administered 2017-10-09: 120 mg via SUBCUTANEOUS

## 2017-10-09 MED ORDER — FULVESTRANT 250 MG/5ML IM SOLN
INTRAMUSCULAR | Status: AC
  Filled 2017-10-09: qty 5

## 2017-10-09 MED ORDER — FULVESTRANT 250 MG/5ML IM SOLN
500.0000 mg | Freq: Once | INTRAMUSCULAR | Status: AC
  Administered 2017-10-09: 500 mg via INTRAMUSCULAR

## 2017-10-09 MED ORDER — DENOSUMAB 120 MG/1.7ML ~~LOC~~ SOLN
SUBCUTANEOUS | Status: AC
  Filled 2017-10-09: qty 1.7

## 2017-10-09 NOTE — Patient Instructions (Signed)
Fulvestrant injection What is this medicine? FULVESTRANT (ful VES trant) blocks the effects of estrogen. It is used to treat breast cancer. This medicine may be used for other purposes; ask your health care provider or pharmacist if you have questions. COMMON BRAND NAME(S): FASLODEX What should I tell my health care provider before I take this medicine? They need to know if you have any of these conditions: -bleeding problems -liver disease -low levels of platelets in the blood -an unusual or allergic reaction to fulvestrant, other medicines, foods, dyes, or preservatives -pregnant or trying to get pregnant -breast-feeding How should I use this medicine? This medicine is for injection into a muscle. It is usually given by a health care professional in a hospital or clinic setting. Talk to your pediatrician regarding the use of this medicine in children. Special care may be needed. Overdosage: If you think you have taken too much of this medicine contact a poison control center or emergency room at once. NOTE: This medicine is only for you. Do not share this medicine with others. What if I miss a dose? It is important not to miss your dose. Call your doctor or health care professional if you are unable to keep an appointment. What may interact with this medicine? -medicines that treat or prevent blood clots like warfarin, enoxaparin, and dalteparin This list may not describe all possible interactions. Give your health care provider a list of all the medicines, herbs, non-prescription drugs, or dietary supplements you use. Also tell them if you smoke, drink alcohol, or use illegal drugs. Some items may interact with your medicine. What should I watch for while using this medicine? Your condition will be monitored carefully while you are receiving this medicine. You will need important blood work done while you are taking this medicine. Do not become pregnant while taking this medicine or for  at least 1 year after stopping it. Women of child-bearing potential will need to have a negative pregnancy test before starting this medicine. Women should inform their doctor if they wish to become pregnant or think they might be pregnant. There is a potential for serious side effects to an unborn child. Men should inform their doctors if they wish to father a child. This medicine may lower sperm counts. Talk to your health care professional or pharmacist for more information. Do not breast-feed an infant while taking this medicine or for 1 year after the last dose. What side effects may I notice from receiving this medicine? Side effects that you should report to your doctor or health care professional as soon as possible: -allergic reactions like skin rash, itching or hives, swelling of the face, lips, or tongue -feeling faint or lightheaded, falls -pain, tingling, numbness, or weakness in the legs -signs and symptoms of infection like fever or chills; cough; flu-like symptoms; sore throat -vaginal bleeding Side effects that usually do not require medical attention (report to your doctor or health care professional if they continue or are bothersome): -aches, pains -constipation -diarrhea -headache -hot flashes -nausea, vomiting -pain at site where injected -stomach pain This list may not describe all possible side effects. Call your doctor for medical advice about side effects. You may report side effects to FDA at 1-800-FDA-1088. Where should I keep my medicine? This drug is given in a hospital or clinic and will not be stored at home. NOTE: This sheet is a summary. It may not cover all possible information. If you have questions about this medicine, talk to your   doctor, pharmacist, or health care provider.  2018 Elsevier/Gold Standard (2015-03-11 11:03:55) Denosumab injection What is this medicine? DENOSUMAB (den oh sue mab) slows bone breakdown. Prolia is used to treat osteoporosis in  women after menopause and in men. Delton See is used to treat a high calcium level due to cancer and to prevent bone fractures and other bone problems caused by multiple myeloma or cancer bone metastases. Delton See is also used to treat giant cell tumor of the bone. This medicine may be used for other purposes; ask your health care provider or pharmacist if you have questions. COMMON BRAND NAME(S): Prolia, XGEVA What should I tell my health care provider before I take this medicine? They need to know if you have any of these conditions: -dental disease -having surgery or tooth extraction -infection -kidney disease -low levels of calcium or Vitamin D in the blood -malnutrition -on hemodialysis -skin conditions or sensitivity -thyroid or parathyroid disease -an unusual reaction to denosumab, other medicines, foods, dyes, or preservatives -pregnant or trying to get pregnant -breast-feeding How should I use this medicine? This medicine is for injection under the skin. It is given by a health care professional in a hospital or clinic setting. If you are getting Prolia, a special MedGuide will be given to you by the pharmacist with each prescription and refill. Be sure to read this information carefully each time. For Prolia, talk to your pediatrician regarding the use of this medicine in children. Special care may be needed. For Delton See, talk to your pediatrician regarding the use of this medicine in children. While this drug may be prescribed for children as young as 13 years for selected conditions, precautions do apply. Overdosage: If you think you have taken too much of this medicine contact a poison control center or emergency room at once. NOTE: This medicine is only for you. Do not share this medicine with others. What if I miss a dose? It is important not to miss your dose. Call your doctor or health care professional if you are unable to keep an appointment. What may interact with this  medicine? Do not take this medicine with any of the following medications: -other medicines containing denosumab This medicine may also interact with the following medications: -medicines that lower your chance of fighting infection -steroid medicines like prednisone or cortisone This list may not describe all possible interactions. Give your health care provider a list of all the medicines, herbs, non-prescription drugs, or dietary supplements you use. Also tell them if you smoke, drink alcohol, or use illegal drugs. Some items may interact with your medicine. What should I watch for while using this medicine? Visit your doctor or health care professional for regular checks on your progress. Your doctor or health care professional may order blood tests and other tests to see how you are doing. Call your doctor or health care professional for advice if you get a fever, chills or sore throat, or other symptoms of a cold or flu. Do not treat yourself. This drug may decrease your body's ability to fight infection. Try to avoid being around people who are sick. You should make sure you get enough calcium and vitamin D while you are taking this medicine, unless your doctor tells you not to. Discuss the foods you eat and the vitamins you take with your health care professional. See your dentist regularly. Brush and floss your teeth as directed. Before you have any dental work done, tell your dentist you are receiving this medicine. Do  not become pregnant while taking this medicine or for 5 months after stopping it. Talk with your doctor or health care professional about your birth control options while taking this medicine. Women should inform their doctor if they wish to become pregnant or think they might be pregnant. There is a potential for serious side effects to an unborn child. Talk to your health care professional or pharmacist for more information. What side effects may I notice from receiving this  medicine? Side effects that you should report to your doctor or health care professional as soon as possible: -allergic reactions like skin rash, itching or hives, swelling of the face, lips, or tongue -bone pain -breathing problems -dizziness -jaw pain, especially after dental work -redness, blistering, peeling of the skin -signs and symptoms of infection like fever or chills; cough; sore throat; pain or trouble passing urine -signs of low calcium like fast heartbeat, muscle cramps or muscle pain; pain, tingling, numbness in the hands or feet; seizures -unusual bleeding or bruising -unusually weak or tired Side effects that usually do not require medical attention (report to your doctor or health care professional if they continue or are bothersome): -constipation -diarrhea -headache -joint pain -loss of appetite -muscle pain -runny nose -tiredness -upset stomach This list may not describe all possible side effects. Call your doctor for medical advice about side effects. You may report side effects to FDA at 1-800-FDA-1088. Where should I keep my medicine? This medicine is only given in a clinic, doctor's office, or other health care setting and will not be stored at home. NOTE: This sheet is a summary. It may not cover all possible information. If you have questions about this medicine, talk to your doctor, pharmacist, or health care provider.  2018 Elsevier/Gold Standard (2016-09-04 19:17:21)  

## 2017-10-10 ENCOUNTER — Ambulatory Visit: Payer: 59 | Admitting: Physical Therapy

## 2017-10-10 ENCOUNTER — Encounter: Payer: Self-pay | Admitting: Physical Therapy

## 2017-10-10 DIAGNOSIS — R293 Abnormal posture: Secondary | ICD-10-CM

## 2017-10-10 DIAGNOSIS — G8929 Other chronic pain: Secondary | ICD-10-CM

## 2017-10-10 DIAGNOSIS — M25612 Stiffness of left shoulder, not elsewhere classified: Secondary | ICD-10-CM

## 2017-10-10 DIAGNOSIS — R2681 Unsteadiness on feet: Secondary | ICD-10-CM

## 2017-10-10 DIAGNOSIS — M5440 Lumbago with sciatica, unspecified side: Secondary | ICD-10-CM

## 2017-10-10 DIAGNOSIS — R221 Localized swelling, mass and lump, neck: Secondary | ICD-10-CM

## 2017-10-10 DIAGNOSIS — M25512 Pain in left shoulder: Secondary | ICD-10-CM

## 2017-10-10 NOTE — Therapy (Signed)
[place], [place], [address] Phone: [phone]   Fax:  [phone]  Physical Therapy Treatment  Patient Details  Name: Alison [name] MRN: [id number] Date of Birth: [birth year] Referring Provider: Gardenia Phlegm, NP   Encounter Date: 10/10/2017  PT End of Session - 10/10/17 1714    Visit Number  7 in 2019    Number of Visits  26    Date for PT Re-Evaluation  10/26/17    PT Start Time  1440    PT Stop Time  1520    PT Time Calculation (min)  40 min    Activity Tolerance  Patient tolerated treatment well    Behavior During Therapy  [facility] for tasks assessed/performed       Past Medical History:  Diagnosis Date  . Anxiety   . Asthma     seasonal asthma  . Breast cancer ([place])    metastatic breast cancer  . Complication of anesthesia 2007   aspiration pneumonia post hysterectomy.  Blood pressure would drop low- but no problems 01/2016- surgery  . History of blood transfusion   . History of mitral valve prolapse 1988   with palpitations  . History of radiation therapy 04/02/16- 05/21/16   Left Chest Wall, SCV, Axilla, IM nodes  . Hypertension    no meds  . Neuropathy    with chemo  . Osteopenia determined by x-ray   . Pneumonia    1980's and 1990's  . Raynaud's disease   . 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 Skin cancer 03/12/2014   basal- back    Past Surgical History:  Procedure Laterality Date  . ABDOMINAL HYSTERECTOMY  2007   with BSO  . APPENDECTOMY  2007  . AXILLARY LYMPH NODE DISSECTION Left 02/14/2016   Procedure: LEFT AXILLARY LYMPH NODE DISSECTION;  Surgeon: [name], MD;  Location: [place];  Service: General;  Laterality: Left;  . BREAST RECONSTRUCTION WITH PLACEMENT OF TISSUE EXPANDER AND FLEX HD (ACELLULAR HYDRATED DERMIS) Left 02/02/2016   Procedure: IMMEDIATE LEFT BREAST RECONSTRUCTION WITH PLACEMENT OF TISSUE EXPANDER AND FLEX HD  (ACELLULAR HYDRATED DERMIS);  Surgeon: [name], DO;  Location: [place];  Service: Plastics;  Laterality: Left;  . BREAST REDUCTION SURGERY Right 03/28/2017   Procedure: RIGHT MAMMARY REDUCTION  (BREAST);  Surgeon: [name], DO;  Location: [place];  Service: Plastics;  Laterality: Right;  . BREAST SURGERY Left 02/02/16   mastectomy with reconstruction  . c-section  1991  . COLON RESECTION SIGMOID  12/02/2012   [facility]   . HERNIA REPAIR  2007  . LAPAROSCOPIC ENDOMETRIOSIS FULGURATION  1984, 1989  . MASTOPEXY Right 03/28/2017   Procedure: MASTOPEXY;  Surgeon: [name], DO;  Location: [place];  Service: Plastics;  Laterality: Right;  . Port- a- cath placement  07/2015  . PORT-A-CATH REMOVAL Right 02/02/2016   Procedure: REMOVAL PORT-A-CATH;  Surgeon: [name], MD;  Location: [place];  Service: General;  Laterality: Right;  . RADIOACTIVE SEED GUIDED AXILLARY SENTINEL LYMPH NODE Left 02/02/2016   Procedure: LEFT BREAST MASTECTOMY AND RADIOACTIVE SEED GUIDED LEFT SENTINEL LYMPH NODE BIOPSY ;  Surgeon: [name], MD;  Location: [place];  Service: General;  Laterality: Left;  . REMOVAL OF TISSUE EXPANDER AND PLACEMENT OF IMPLANT Left 03/28/2017   Procedure: REMOVAL OF TISSUE EXPANDER AND PLACEMENT OF IMPLANT;  Surgeon: Marla Roe,  Loel Lofty, DO;  Location: [place];  Service: Plastics;  Laterality: Left;    There were no vitals filed for this visit.  Subjective Assessment - 10/10/17 1711    Subjective  Pt is going to [place] tomorrow for the weekend.  She reports she is having lots of pain in her left shoulder and wants to have treatment to this area     Pertinent History  Breast cancer diagnosed November 2016 She had chemotherapy. but did have some neuropathy so it had to be stopped. On June 8, she had a mastectomy with immedicate expander placement and with first set  of lymphnodes removed, On June 20 she went for complete axillary lymph node dissection. She says she has had always been "full necked" but has neck and upper body fullness since the cancer was diagnosed. She reports she has osteopenia in low back and has had a fracture that was discovered in 2014 when she had a Dexa scan. Pt has had metastasis to the bones of the spine and recent  bilateral breast surgery for a reduction on the right and expander removal on the left with implant placement and removal     Patient Stated Goals  to improve her dizziness and prevent future falls    Currently in Pain?  Yes    Pain Score  6     Pain Location  Axilla    Pain Orientation  Left    Pain Descriptors / Indicators  Aching    Pain Type  Acute pain                      [facility] Adult PT Treatment/Exercise - 10/10/17 0001      Manual Therapy   Manual Therapy  Edema management;Soft tissue mobilization;Myofascial release    Soft tissue mobilization  with biotone to tight trigger points at left posterior axilla with prolonged pressure on tender spots with some relief perceived     Myofascial Release  to scar on left chest     Manual Lymphatic Drainage (MLD)  short neck superficial and deep abdominals anterior interaxillary and left axillo inguinal anastamosis and left shoulder chest and back                PT Short Term Goals - 10/10/17 1717      PT SHORT TERM GOAL #1   Title  Pt will report that she has had 25% improvement in pain in left chest and axilla so that she can perform her kitchen tasks easier.     Time  8    Period  Weeks    Status  On-going      PT SHORT TERM GOAL #2   Title  Pt will report she is doing her arm range of motion exercises 5 times a week     Time  8    Period  Weeks    Status  On-going      Short Term Clinic Goals - 08/28/17 1118      CC Short Term Goal  #1   Title  Pt will report she knows strategies for good body mechanics to decrease low back pain      Status  Achieved      CC Short Term Goal  #2   Title  Pt will have a deacrease in circumference 15 cm prox to ulnar styloid of left forearm by 1 cm     Baseline  25 cm , on 05/16/2017  it is 23.5    Status  Achieved      CC Short Term Goal  #3   Title  Pt will report she knows how to manage left UE lymphedema with use of compression, elevation and exercise, self manual lymph drainage and possibly Flexitouch     Baseline  Pt has day and nighttime garments and Flexitouch     Status  Achieved       PT Long Term Goals - 10/10/17 1717      PT LONG TERM GOAL #1   Title  Pt wants to improve her balance so that she feels more confident with her gait and not be fearful of tripping     Time  8    Period  Weeks    Status  On-going      PT LONG TERM GOAL #2   Title  Pt will have 150 degrees of shoulder flexion in supine indicating so that she will have greater ease in reaching activites in her daily life     Time  8    Period  Weeks    Status  On-going        Long Term Clinic Goals - 08/28/17 1119      CC Long Term Goal  #1   Title  Patient will report  that she can manage back pain  so she can perform daily activities with greater ease    Status  Achieved      CC Long Term Goal  #2   Title  Pt will  have given the TENS unit a trial to see if it will help with back pain     Baseline  has not needed to use TENS     Status  Deferred      CC Long Term Goal  #3   Title  Pt will be independent in core and  LE strengthening exercises       CC Long Term Goal  #4   Title  Pt will report low back pain is decreased to a 4/10 so that she can perform activities of daily living easier     Baseline  It depends on the day , but pt needs to take breaks and knows how to manage her pain, she feels this goal is achieved     Status  Achieved         Plan - 10/10/17 1715    Clinical Impression Statement  Pt continues to struggle with pain in left upper quadrant She has very tight scar at  chest and lots of trigger points were identified in left posterior axilla today.  Pt reports she received some relief with treatment and felt much better when she left     Rehab Potential  Fair    Clinical Impairments Affecting Rehab Potential  mulitple areas of diffuse bony metastasis, chemotherapy with neuropathy , radiation to back  and chest,    PT Frequency  2x / week    PT Duration  8 weeks    PT Treatment/Interventions  ADLs/Self Care Home Management;Manual lymph drainage;Compression bandaging;Scar mobilization;Passive range of motion;Patient/family education;Taping;Manual techniques;Therapeutic exercise;Therapeutic activities;Orthotic Fit/Training;DME Instruction;Electrical Stimulation;Moist Heat;Gait training;Stair training;Functional mobility training;Balance training;Neuromuscular re-education;Vestibular    PT Next Visit Plan  Try TENS unit to see if it makes a difference in pain when pt is ative. contiue with exrcise for aerobics and LE strenth or Focus on myofascial work to left chest and manual lymph draiange to left axilla contine with kinesiotape  prn;     Consulted and Agree with Plan of Care  Patient       Patient will benefit from skilled therapeutic intervention in order to improve the following deficits and impairments:  Decreased range of motion, Impaired UE functional use, Decreased activity tolerance, Decreased knowledge of precautions, Decreased skin integrity, Impaired perceived functional ability, Pain, Decreased knowledge of use of DME, Increased edema, Postural dysfunction, Decreased strength, Impaired sensation, Decreased scar mobility, Decreased mobility, Increased muscle spasms, Dizziness, Increased fascial restricitons, Decreased balance, Difficulty walking  Visit Diagnosis: Unsteadiness on feet  Abnormal posture  Localized swelling, mass and lump, neck  Chronic left shoulder pain  Stiffness of left shoulder joint  Low back pain with sciatica, sciatica  laterality unspecified, unspecified back pain laterality, unspecified chronicity     Problem List Patient Active Problem List   Diagnosis Date Noted  . Pain from bone metastases ([place]) 09/11/2017  . Counseling regarding goals of care 04/16/2017  . Bone metastases ([place]) 02/28/2017  . Uncontrolled pain 02/28/2017  . Osteopenia determined by x-ray 09/20/2016  . Primary malignant neoplasm of left breast with stage 2 nodal metastasis per American Joint Committee on Cancer 7th edition (N2) ([place]) 02/14/2016  . Chemotherapy-induced neuropathy ([place]) 12/30/2015  . Insomnia 09/01/2015  . Malignant neoplasm of upper-outer quadrant of left breast in female, estrogen receptor positive ([place]) 08/08/2015   Alison [name] PT  Alison [name] 10/10/2017, 5:19 PM  [place], [place], [address] Phone: [phone]   Fax:  [phone]  Name: Alison [name] MRN: [id number] Date of Birth: 06-12-1961

## 2017-10-14 ENCOUNTER — Encounter: Payer: 59 | Admitting: Physical Therapy

## 2017-10-15 ENCOUNTER — Encounter: Payer: Self-pay | Admitting: Physical Therapy

## 2017-10-15 ENCOUNTER — Ambulatory Visit: Payer: 59 | Admitting: Physical Therapy

## 2017-10-15 DIAGNOSIS — M5440 Lumbago with sciatica, unspecified side: Secondary | ICD-10-CM

## 2017-10-15 DIAGNOSIS — R296 Repeated falls: Secondary | ICD-10-CM

## 2017-10-15 DIAGNOSIS — R2681 Unsteadiness on feet: Secondary | ICD-10-CM | POA: Diagnosis not present

## 2017-10-15 DIAGNOSIS — G8929 Other chronic pain: Secondary | ICD-10-CM

## 2017-10-15 DIAGNOSIS — L599 Disorder of the skin and subcutaneous tissue related to radiation, unspecified: Secondary | ICD-10-CM

## 2017-10-15 DIAGNOSIS — R221 Localized swelling, mass and lump, neck: Secondary | ICD-10-CM

## 2017-10-15 DIAGNOSIS — M25512 Pain in left shoulder: Secondary | ICD-10-CM

## 2017-10-15 DIAGNOSIS — M25612 Stiffness of left shoulder, not elsewhere classified: Secondary | ICD-10-CM

## 2017-10-15 DIAGNOSIS — R208 Other disturbances of skin sensation: Secondary | ICD-10-CM

## 2017-10-15 DIAGNOSIS — R293 Abnormal posture: Secondary | ICD-10-CM

## 2017-10-15 NOTE — Therapy (Signed)
[place], [place], [address] Phone: [phone]   Fax:  [phone]  Physical Therapy Treatment  Patient Details  Name: Alison [name] MRN: [id number] Date of Birth: Sep 05, 1960 Referring Provider: Gardenia Phlegm, NP   Encounter Date: 10/15/2017  PT End of Session - 10/15/17 1646    Visit Number  8 in 2019    Number of Visits  26    Date for PT Re-Evaluation  10/26/17    PT Start Time  1300    PT Stop Time  1345    PT Time Calculation (min)  45 min    Activity Tolerance  Patient tolerated treatment well    Behavior During Therapy  [facility] for tasks assessed/performed       Past Medical History:  Diagnosis Date  . Anxiety   . Asthma     seasonal asthma  . Breast cancer ([place])    metastatic breast cancer  . Complication of anesthesia 2007   aspiration pneumonia post hysterectomy.  Blood pressure would drop low- but no problems 01/2016- surgery  . History of blood transfusion   . History of mitral valve prolapse 1988   with palpitations  . History of radiation therapy 04/02/16- 05/21/16   Left Chest Wall, SCV, Axilla, IM nodes  . Hypertension    no meds  . Neuropathy    with chemo  . Osteopenia determined by x-ray   . Pneumonia    1980's and 1990's  . Raynaud's disease   . 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 Skin cancer 03/12/2014   basal- back    Past Surgical History:  Procedure Laterality Date  . ABDOMINAL HYSTERECTOMY  2007   with BSO  . APPENDECTOMY  2007  . AXILLARY LYMPH NODE DISSECTION Left 02/14/2016   Procedure: LEFT AXILLARY LYMPH NODE DISSECTION;  Surgeon: [name], MD;  Location: [place];  Service: General;  Laterality: Left;  . BREAST RECONSTRUCTION WITH PLACEMENT OF TISSUE EXPANDER AND FLEX HD (ACELLULAR HYDRATED DERMIS) Left 02/02/2016   Procedure: IMMEDIATE LEFT BREAST RECONSTRUCTION WITH PLACEMENT OF TISSUE EXPANDER AND FLEX HD  (ACELLULAR HYDRATED DERMIS);  Surgeon: [name], DO;  Location: [place];  Service: Plastics;  Laterality: Left;  . BREAST REDUCTION SURGERY Right 03/28/2017   Procedure: RIGHT MAMMARY REDUCTION  (BREAST);  Surgeon: [name], DO;  Location: [place];  Service: Plastics;  Laterality: Right;  . BREAST SURGERY Left 02/02/16   mastectomy with reconstruction  . c-section  1991  . COLON RESECTION SIGMOID  12/02/2012   [facility]   . HERNIA REPAIR  2007  . LAPAROSCOPIC ENDOMETRIOSIS FULGURATION  1984, 1989  . MASTOPEXY Right 03/28/2017   Procedure: MASTOPEXY;  Surgeon: [name], DO;  Location: [place];  Service: Plastics;  Laterality: Right;  . Port- a- cath placement  07/2015  . PORT-A-CATH REMOVAL Right 02/02/2016   Procedure: REMOVAL PORT-A-CATH;  Surgeon: [name], MD;  Location: [place];  Service: General;  Laterality: Right;  . RADIOACTIVE SEED GUIDED AXILLARY SENTINEL LYMPH NODE Left 02/02/2016   Procedure: LEFT BREAST MASTECTOMY AND RADIOACTIVE SEED GUIDED LEFT SENTINEL LYMPH NODE BIOPSY ;  Surgeon: [name], MD;  Location: [place];  Service: General;  Laterality: Left;  . REMOVAL OF TISSUE EXPANDER AND PLACEMENT OF IMPLANT Left 03/28/2017   Procedure: REMOVAL OF TISSUE EXPANDER AND PLACEMENT OF IMPLANT;  Surgeon: Marla Roe,  Loel Lofty, DO;  Location: [place];  Service: Plastics;  Laterality: Left;    There were no vitals filed for this visit.  Subjective Assessment - 10/15/17 1309    Subjective  Pt had a good time in [place] this past weekend.  She did not have any problems  She wore her sleeve and gauntlet on the plane.  She says she got relief from soft tissue work to left shoulder  last session             LYMPHEDEMA/ONCOLOGY QUESTIONNAIRE - 10/15/17 1311      Left Upper Extremity Lymphedema   Olecranon Process  23 cm    15 cm Proximal to Ulnar  Styloid Process  22.7 cm    10 cm Proximal to Ulnar Styloid Process  19.9 cm    Just Proximal to Ulnar Styloid Process  14.7 cm    Across Hand at [employer]  17.5 cm    At [place] of 2nd Digit  5.6 cm               [facility] Adult PT Treatment/Exercise - 10/15/17 0001      Manual Therapy   Manual Therapy  Edema management;Soft tissue mobilization;Myofascial release    Edema Management  remeasured her left arm circumference.      Soft tissue mobilization  with biotone to tight trigger points at left posterior axilla with prolonged pressure on tender spots with some relief perceived     Myofascial Release  to scar on left chest     Manual Lymphatic Drainage (MLD)  short neck superficial and deep abdominals anterior interaxillary and left axillo inguinal anastamosis and left shoulder chest and back                PT Short Term Goals - 10/10/17 1717      PT SHORT TERM GOAL #1   Title  Pt will report that she has had 25% improvement in pain in left chest and axilla so that she can perform her kitchen tasks easier.     Time  8    Period  Weeks    Status  On-going      PT SHORT TERM GOAL #2   Title  Pt will report she is doing her arm range of motion exercises 5 times a week     Time  8    Period  Weeks    Status  On-going      Short Term Clinic Goals - 08/28/17 1118      CC Short Term Goal  #1   Title  Pt will report she knows strategies for good body mechanics to decrease low back pain     Status  Achieved      CC Short Term Goal  #2   Title  Pt will have a deacrease in circumference 15 cm prox to ulnar styloid of left forearm by 1 cm     Baseline  25 cm , on 05/16/2017 it is 23.5    Status  Achieved      CC Short Term Goal  #3   Title  Pt will report she knows how to manage left UE lymphedema with use of compression, elevation and exercise, self manual lymph drainage and possibly Flexitouch     Baseline  Pt has day and nighttime garments and Flexitouch      Status  Achieved       PT Long Term Goals - 10/10/17 1717  PT LONG TERM GOAL #1   Title  Pt wants to improve her balance so that she feels more confident with her gait and not be fearful of tripping     Time  8    Period  Weeks    Status  On-going      PT LONG TERM GOAL #2   Title  Pt will have 150 degrees of shoulder flexion in supine indicating so that she will have greater ease in reaching activites in her daily life     Time  8    Period  Weeks    Status  On-going        Long Term Clinic Goals - 08/28/17 1119      CC Long Term Goal  #1   Title  Patient will report  that she can manage back pain  so she can perform daily activities with greater ease    Status  Achieved      CC Long Term Goal  #2   Title  Pt will  have given the TENS unit a trial to see if it will help with back pain     Baseline  has not needed to use TENS     Status  Deferred      CC Long Term Goal  #3   Title  Pt will be independent in core and  LE strengthening exercises       CC Long Term Goal  #4   Title  Pt will report low back pain is decreased to a 4/10 so that she can perform activities of daily living easier     Baseline  It depends on the day , but pt needs to take breaks and knows how to manage her pain, she feels this goal is achieved     Status  Achieved         Plan - 10/15/17 1647    Clinical Impression Statement  Pt did not have an increase in her arm circumference with airplane travel.  She was able to go and enjoy herself and her husband without difficulty.  she continues with tightness in chest and painful fullness in left axilla with trigger points but feels the therapy is helping her to manage it to a level that she can continue to enjoy her lift.  the fullness continues to put her at risk for infection     Clinical Impairments Affecting Rehab Potential  mulitple areas of diffuse bony metastasis, chemotherapy with neuropathy , radiation to back  and chest,    PT Frequency   2x / week    PT Duration  8 weeks    PT Treatment/Interventions  ADLs/Self Care Home Management;Manual lymph drainage;Compression bandaging;Scar mobilization;Passive range of motion;Patient/family education;Taping;Manual techniques;Therapeutic exercise;Therapeutic activities;Orthotic Fit/Training;DME Instruction;Electrical Stimulation;Moist Heat;Gait training;Stair training;Functional mobility training;Balance training;Neuromuscular re-education;Vestibular    PT Next Visit Plan  Try TENS unit to see if it makes a difference in pain when pt is ative. contiue with exrcise for aerobics and LE strenth or Focus on myofascial work to left chest and manual lymph draiange to left axilla contine with kinesiotape prn;        Patient will benefit from skilled therapeutic intervention in order to improve the following deficits and impairments:  Decreased range of motion, Impaired UE functional use, Decreased activity tolerance, Decreased knowledge of precautions, Decreased skin integrity, Impaired perceived functional ability, Pain, Decreased knowledge of use of DME, Increased edema, Postural dysfunction, Decreased strength, Impaired sensation, Decreased scar mobility,  Decreased mobility, Increased muscle spasms, Dizziness, Increased fascial restricitons, Decreased balance, Difficulty walking  Visit Diagnosis: Unsteadiness on feet  Abnormal posture  Localized swelling, mass and lump, neck  Chronic left shoulder pain  Stiffness of left shoulder joint  Low back pain with sciatica, sciatica laterality unspecified, unspecified back pain laterality, unspecified chronicity  Repeated falls  Other disturbances of skin sensation  Disorder of the skin and subcutaneous tissue related to radiation, unspecified     Problem List Patient Active Problem List   Diagnosis Date Noted  . Pain from bone metastases ([place]) 09/11/2017  . Counseling regarding goals of care 04/16/2017  . Bone metastases ([place])  02/28/2017  . Uncontrolled pain 02/28/2017  . Osteopenia determined by x-ray 09/20/2016  . Primary malignant neoplasm of left breast with stage 2 nodal metastasis per American Joint Committee on Cancer 7th edition (N2) ([place]) 02/14/2016  . Chemotherapy-induced neuropathy ([place]) 12/30/2015  . Insomnia 09/01/2015  . Malignant neoplasm of upper-outer quadrant of left breast in female, estrogen receptor positive ([place]) 08/08/2015   Donato Heinz. Owens Shark PT  Norwood Levo 10/15/2017, 4:50 PM  [place], [place], [address] Phone: [phone]   Fax:  [phone]  Name: Alison [name] MRN: [id number] Date of Birth: [birth year]

## 2017-10-17 ENCOUNTER — Ambulatory Visit: Payer: 59 | Admitting: Physical Therapy

## 2017-10-17 DIAGNOSIS — M5440 Lumbago with sciatica, unspecified side: Secondary | ICD-10-CM

## 2017-10-17 DIAGNOSIS — R221 Localized swelling, mass and lump, neck: Secondary | ICD-10-CM

## 2017-10-17 DIAGNOSIS — R296 Repeated falls: Secondary | ICD-10-CM

## 2017-10-17 DIAGNOSIS — R2681 Unsteadiness on feet: Secondary | ICD-10-CM | POA: Diagnosis not present

## 2017-10-17 DIAGNOSIS — L599 Disorder of the skin and subcutaneous tissue related to radiation, unspecified: Secondary | ICD-10-CM

## 2017-10-17 DIAGNOSIS — R293 Abnormal posture: Secondary | ICD-10-CM

## 2017-10-17 DIAGNOSIS — G8929 Other chronic pain: Secondary | ICD-10-CM

## 2017-10-17 DIAGNOSIS — M25612 Stiffness of left shoulder, not elsewhere classified: Secondary | ICD-10-CM

## 2017-10-17 DIAGNOSIS — M25512 Pain in left shoulder: Secondary | ICD-10-CM

## 2017-10-18 ENCOUNTER — Encounter: Payer: Self-pay | Admitting: Physical Therapy

## 2017-10-18 ENCOUNTER — Other Ambulatory Visit: Payer: Self-pay | Admitting: *Deleted

## 2017-10-18 ENCOUNTER — Other Ambulatory Visit: Payer: Self-pay

## 2017-10-18 DIAGNOSIS — C50412 Malignant neoplasm of upper-outer quadrant of left female breast: Secondary | ICD-10-CM

## 2017-10-18 DIAGNOSIS — Z170 Estrogen receptor positive status [ER+]: Principal | ICD-10-CM

## 2017-10-18 MED ORDER — ZOLPIDEM TARTRATE 5 MG PO TABS: 5.0000 mg | ORAL_TABLET | Freq: Every evening | ORAL | 2 refills | Status: DC | PRN

## 2017-10-18 MED ORDER — OXYCODONE HCL 20 MG PO TABS: 20.0000 mg | ORAL_TABLET | ORAL | 0 refills | Status: DC | PRN

## 2017-10-18 NOTE — Telephone Encounter (Signed)
Received VM from pt regarding her refill for Oxycodone 20 mg.  Reviewed Dr Magrinat's office 10/03/2017 notes regarding pt's pain management and she has f/u visit on 11/06/17.  Dr [name] completed refill, I notified pt and she will pick up prescription in [place] after noon today, no other needs per pt at this time.

## 2017-10-18 NOTE — Therapy (Signed)
[place], [place], [address] Phone: [phone]   Fax:  [phone]  Physical Therapy Treatment  Patient Details  Name: Alison [name] MRN: [id number] Date of Birth: 08/24/1961 Referring Provider: Gardenia Phlegm, NP   Encounter Date: 10/17/2017  PT End of Session - 10/18/17 0746    Visit Number  9    Number of Visits  26    PT Start Time  3762 therapist late to start treatment     PT Stop Time  1515    PT Time Calculation (min)  30 min    Activity Tolerance  Patient tolerated treatment well    Behavior During Therapy  [facility] for tasks assessed/performed       Past Medical History:  Diagnosis Date  . Anxiety   . Asthma     seasonal asthma  . Breast cancer ([place])    metastatic breast cancer  . Complication of anesthesia 2007   aspiration pneumonia post hysterectomy.  Blood pressure would drop low- but no problems 01/2016- surgery  . History of blood transfusion   . History of mitral valve prolapse 1988   with palpitations  . History of radiation therapy 04/02/16- 05/21/16   Left Chest Wall, SCV, Axilla, IM nodes  . Hypertension    no meds  . Neuropathy    with chemo  . Osteopenia determined by x-ray   . Pneumonia    1980's and 1990's  . Raynaud's disease   . 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 Skin cancer 03/12/2014   basal- back    Past Surgical History:  Procedure Laterality Date  . ABDOMINAL HYSTERECTOMY  2007   with BSO  . APPENDECTOMY  2007  . AXILLARY LYMPH NODE DISSECTION Left 02/14/2016   Procedure: LEFT AXILLARY LYMPH NODE DISSECTION;  Surgeon: [name], MD;  Location: [place];  Service: General;  Laterality: Left;  . BREAST RECONSTRUCTION WITH PLACEMENT OF TISSUE EXPANDER AND FLEX HD (ACELLULAR HYDRATED DERMIS) Left 02/02/2016   Procedure: IMMEDIATE LEFT BREAST RECONSTRUCTION WITH PLACEMENT OF TISSUE EXPANDER AND FLEX HD (ACELLULAR  HYDRATED DERMIS);  Surgeon: [name], DO;  Location: [place];  Service: Plastics;  Laterality: Left;  . BREAST REDUCTION SURGERY Right 03/28/2017   Procedure: RIGHT MAMMARY REDUCTION  (BREAST);  Surgeon: [name], DO;  Location: [place];  Service: Plastics;  Laterality: Right;  . BREAST SURGERY Left 02/02/16   mastectomy with reconstruction  . c-section  1991  . COLON RESECTION SIGMOID  12/02/2012   [facility]   . HERNIA REPAIR  2007  . LAPAROSCOPIC ENDOMETRIOSIS FULGURATION  1984, 1989  . MASTOPEXY Right 03/28/2017   Procedure: MASTOPEXY;  Surgeon: [name], DO;  Location: [place];  Service: Plastics;  Laterality: Right;  . Port- a- cath placement  07/2015  . PORT-A-CATH REMOVAL Right 02/02/2016   Procedure: REMOVAL PORT-A-CATH;  Surgeon: [name], MD;  Location: [place];  Service: General;  Laterality: Right;  . RADIOACTIVE SEED GUIDED AXILLARY SENTINEL LYMPH NODE Left 02/02/2016   Procedure: LEFT BREAST MASTECTOMY AND RADIOACTIVE SEED GUIDED LEFT SENTINEL LYMPH NODE BIOPSY ;  Surgeon: [name], MD;  Location: [place];  Service: General;  Laterality: Left;  . REMOVAL OF TISSUE EXPANDER AND PLACEMENT OF IMPLANT Left 03/28/2017   Procedure: REMOVAL OF TISSUE EXPANDER AND PLACEMENT OF IMPLANT;  Surgeon: [name], DO;  Location:  [place];  Service: Plastics;  Laterality: Left;    There were no vitals filed for this visit.  Subjective Assessment - 10/18/17 0739    Subjective  Alison [name] is feeling good today.  She stopped her Verzinio onthe 15th and is noticing she feels better when she is on the "off " cycle.  Her shoulder feels much better today and she wants to work on strengthening.     Pertinent History  Breast cancer diagnosed November 2016 She had chemotherapy. but did have some neuropathy so it had to be stopped. On June 8, she had a mastectomy with  immedicate expander placement and with first set of lymphnodes removed, On June 20 she went for complete axillary lymph node dissection. She says she has had always been "full necked" but has neck and upper body fullness since the cancer was diagnosed. She reports she has osteopenia in low back and has had a fracture that was discovered in 2014 when she had a Dexa scan. Pt has had metastasis to the bones of the spine and recent  bilateral breast surgery for a reduction on the right and expander removal on the left with implant placement and removal     Patient Stated Goals  to improve her dizziness and prevent future falls    Currently in Pain?  No/denies                      [facility] Adult PT Treatment/Exercise - 10/18/17 0001      High Level Balance   High Level Balance Comments  standing on one leg with attention to core activation and opposite leg goes to 12, 3 and 6 oclock then knee up x 5 reps. Did this with each leg, then 5 reps of same motion, but keeping toe on the ground to draw a circle.  Pt needed hand hold support on 2 dowels       Lumbar Exercises: Aerobic   Tread Mill  0% grad 1.5 mph for 10 minutes       Lumbar Exercises: Supine   Bent Knee Raise  10 reps    Dead Bug  10 reps starting with feet down on mat and just partial raise of leg    Other Supine Lumbar Exercises  assist both legs to table top for 2 second holds -with core setting prior to activity 5 reps     Other Supine Lumbar Exercises  10 reps of ball squeezed between knees       Knee/Hip Exercises: Standing   Functional Squat Limitations  10 mini squats with purple ball squeezed between knees from high mat       Shoulder Exercises: Sidelying   ABduction  AROM;Right;Left;10 reps               PT Short Term Goals - 10/10/17 1717      PT SHORT TERM GOAL #1   Title  Pt will report that she has had 25% improvement in pain in left chest and axilla so that she can perform her kitchen tasks easier.      Time  8    Period  Weeks    Status  On-going      PT SHORT TERM GOAL #2   Title  Pt will report she is doing her arm range of motion exercises 5 times a week     Time  8    Period  Weeks    Status  On-going  Short Term Clinic Goals - 08/28/17 1118      CC Short Term Goal  #1   Title  Pt will report she knows strategies for good body mechanics to decrease low back pain     Status  Achieved      CC Short Term Goal  #2   Title  Pt will have a deacrease in circumference 15 cm prox to ulnar styloid of left forearm by 1 cm     Baseline  25 cm , on 05/16/2017 it is 23.5    Status  Achieved      CC Short Term Goal  #3   Title  Pt will report she knows how to manage left UE lymphedema with use of compression, elevation and exercise, self manual lymph drainage and possibly Flexitouch     Baseline  Pt has day and nighttime garments and Flexitouch     Status  Achieved       PT Long Term Goals - 10/10/17 1717      PT LONG TERM GOAL #1   Title  Pt wants to improve her balance so that she feels more confident with her gait and not be fearful of tripping     Time  8    Period  Weeks    Status  On-going      PT LONG TERM GOAL #2   Title  Pt will have 150 degrees of shoulder flexion in supine indicating so that she will have greater ease in reaching activites in her daily life     Time  8    Period  Weeks    Status  On-going        Long Term Clinic Goals - 08/28/17 1119      CC Long Term Goal  #1   Title  Patient will report  that she can manage back pain  so she can perform daily activities with greater ease    Status  Achieved      CC Long Term Goal  #2   Title  Pt will  have given the TENS unit a trial to see if it will help with back pain     Baseline  has not needed to use TENS     Status  Deferred      CC Long Term Goal  #3   Title  Pt will be independent in core and  LE strengthening exercises       CC Long Term Goal  #4   Title  Pt will report low back pain  is decreased to a 4/10 so that she can perform activities of daily living easier     Baseline  It depends on the day , but pt needs to take breaks and knows how to manage her pain, she feels this goal is achieved     Status  Achieved         Plan - 10/18/17 0747    Clinical Impression Statement  Pt is doing very well today and was able to perform strengtehning exercises.  Her leg strength is good, but core weakness is evident ...she reports she has had difficulty with this since her 2 abdominal hernia surgeries.  she feels that PT is helping her be able to do other enjoyable actiivites in her life and she wants to keep continue.     Clinical Impairments Affecting Rehab Potential  mulitple areas of diffuse bony metastasis, chemotherapy with neuropathy , radiation to back  and  chest,    PT Frequency  2x / week    PT Duration  8 weeks    PT Treatment/Interventions  ADLs/Self Care Home Management;Manual lymph drainage;Compression bandaging;Scar mobilization;Passive range of motion;Patient/family education;Taping;Manual techniques;Therapeutic exercise;Therapeutic activities;Orthotic Fit/Training;DME Instruction;Electrical Stimulation;Moist Heat;Gait training;Stair training;Functional mobility training;Balance training;Neuromuscular re-education;Vestibular    PT Next Visit Plan  Try TENS unit to see if it makes a difference in pain when pt is ative. contiue with exrcise for aerobics and LE strenth or Focus on myofascial work to left chest and manual lymph draiange to left axilla contine with kinesiotape prn;        Patient will benefit from skilled therapeutic intervention in order to improve the following deficits and impairments:  Decreased range of motion, Impaired UE functional use, Decreased activity tolerance, Decreased knowledge of precautions, Decreased skin integrity, Impaired perceived functional ability, Pain, Decreased knowledge of use of DME, Increased edema, Postural dysfunction, Decreased  strength, Impaired sensation, Decreased scar mobility, Decreased mobility, Increased muscle spasms, Dizziness, Increased fascial restricitons, Decreased balance, Difficulty walking  Visit Diagnosis: Unsteadiness on feet  Abnormal posture  Localized swelling, mass and lump, neck  Chronic left shoulder pain  Stiffness of left shoulder joint  Low back pain with sciatica, sciatica laterality unspecified, unspecified back pain laterality, unspecified chronicity  Repeated falls  Disorder of the skin and subcutaneous tissue related to radiation, unspecified     Problem List Patient Active Problem List   Diagnosis Date Noted  . Pain from bone metastases ([place]) 09/11/2017  . Counseling regarding goals of care 04/16/2017  . Bone metastases ([place]) 02/28/2017  . Uncontrolled pain 02/28/2017  . Osteopenia determined by x-ray 09/20/2016  . Primary malignant neoplasm of left breast with stage 2 nodal metastasis per American Joint Committee on Cancer 7th edition (N2) ([place]) 02/14/2016  . Chemotherapy-induced neuropathy ([place]) 12/30/2015  . Insomnia 09/01/2015  . Malignant neoplasm of upper-outer quadrant of left breast in female, estrogen receptor positive ([place]) 08/08/2015   Donato Heinz. Owens Shark PT  [place] Levo 10/18/2017, 7:50 AM  [place], [place], [address] Phone: [phone]   Fax:  [phone]  Name: Alison [name] MRN: [id number] Date of Birth: 17-Apr-1961

## 2017-10-21 ENCOUNTER — Encounter: Payer: 59 | Admitting: Physical Therapy

## 2017-10-21 ENCOUNTER — Telehealth: Payer: Self-pay | Admitting: Oncology

## 2017-10-21 NOTE — Telephone Encounter (Signed)
FAXED RECORDS TO  [facility] [phone]

## 2017-10-22 ENCOUNTER — Encounter: Payer: Self-pay | Admitting: Physical Therapy

## 2017-10-22 ENCOUNTER — Ambulatory Visit: Payer: 59 | Admitting: Physical Therapy

## 2017-10-22 DIAGNOSIS — R221 Localized swelling, mass and lump, neck: Secondary | ICD-10-CM

## 2017-10-22 DIAGNOSIS — R293 Abnormal posture: Secondary | ICD-10-CM

## 2017-10-22 DIAGNOSIS — M25612 Stiffness of left shoulder, not elsewhere classified: Secondary | ICD-10-CM

## 2017-10-22 DIAGNOSIS — M25512 Pain in left shoulder: Secondary | ICD-10-CM

## 2017-10-22 DIAGNOSIS — L599 Disorder of the skin and subcutaneous tissue related to radiation, unspecified: Secondary | ICD-10-CM

## 2017-10-22 DIAGNOSIS — M5440 Lumbago with sciatica, unspecified side: Secondary | ICD-10-CM

## 2017-10-22 DIAGNOSIS — R296 Repeated falls: Secondary | ICD-10-CM

## 2017-10-22 DIAGNOSIS — R208 Other disturbances of skin sensation: Secondary | ICD-10-CM

## 2017-10-22 DIAGNOSIS — G8929 Other chronic pain: Secondary | ICD-10-CM

## 2017-10-22 DIAGNOSIS — R2681 Unsteadiness on feet: Secondary | ICD-10-CM

## 2017-10-22 MED FILL — VERZENIO 100 MG TAB: 100 | 28 days supply | Qty: 28 | Fill #1

## 2017-10-22 NOTE — Therapy (Signed)
[place], [place], [address] Phone: [phone]   Fax:  [phone]  Physical Therapy Treatment  Patient Details  Name: Alison [name] MRN: [id number] Date of Birth: 1960-09-23 Referring Provider: Gardenia Phlegm, NP   Encounter Date: 10/22/2017  PT End of Session - 10/22/17 1723    Visit Number  10    Number of Visits  26    Date for PT Re-Evaluation  10/26/17    PT Start Time  1430    PT Stop Time  1515    PT Time Calculation (min)  45 min    Behavior During Therapy  [facility] for tasks assessed/performed       Past Medical History:  Diagnosis Date  . Anxiety   . Asthma     seasonal asthma  . Breast cancer ([place])    metastatic breast cancer  . Complication of anesthesia 2007   aspiration pneumonia post hysterectomy.  Blood pressure would drop low- but no problems 01/2016- surgery  . History of blood transfusion   . History of mitral valve prolapse 1988   with palpitations  . History of radiation therapy 04/02/16- 05/21/16   Left Chest Wall, SCV, Axilla, IM nodes  . Hypertension    no meds  . Neuropathy    with chemo  . Osteopenia determined by x-ray   . Pneumonia    1980's and 1990's  . Raynaud's disease   . 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 Skin cancer 03/12/2014   basal- back    Past Surgical History:  Procedure Laterality Date  . ABDOMINAL HYSTERECTOMY  2007   with BSO  . APPENDECTOMY  2007  . AXILLARY LYMPH NODE DISSECTION Left 02/14/2016   Procedure: LEFT AXILLARY LYMPH NODE DISSECTION;  Surgeon: [name], MD;  Location: [place];  Service: General;  Laterality: Left;  . BREAST RECONSTRUCTION WITH PLACEMENT OF TISSUE EXPANDER AND FLEX HD (ACELLULAR HYDRATED DERMIS) Left 02/02/2016   Procedure: IMMEDIATE LEFT BREAST RECONSTRUCTION WITH PLACEMENT OF TISSUE EXPANDER AND FLEX HD (ACELLULAR HYDRATED DERMIS);  Surgeon: [name], DO;   Location: [place];  Service: Plastics;  Laterality: Left;  . BREAST REDUCTION SURGERY Right 03/28/2017   Procedure: RIGHT MAMMARY REDUCTION  (BREAST);  Surgeon: [name], DO;  Location: [place];  Service: Plastics;  Laterality: Right;  . BREAST SURGERY Left 02/02/16   mastectomy with reconstruction  . c-section  1991  . COLON RESECTION SIGMOID  12/02/2012   [facility]   . HERNIA REPAIR  2007  . LAPAROSCOPIC ENDOMETRIOSIS FULGURATION  1984, 1989  . MASTOPEXY Right 03/28/2017   Procedure: MASTOPEXY;  Surgeon: [name], DO;  Location: [place];  Service: Plastics;  Laterality: Right;  . Port- a- cath placement  07/2015  . PORT-A-CATH REMOVAL Right 02/02/2016   Procedure: REMOVAL PORT-A-CATH;  Surgeon: [name], MD;  Location: [place];  Service: General;  Laterality: Right;  . RADIOACTIVE SEED GUIDED AXILLARY SENTINEL LYMPH NODE Left 02/02/2016   Procedure: LEFT BREAST MASTECTOMY AND RADIOACTIVE SEED GUIDED LEFT SENTINEL LYMPH NODE BIOPSY ;  Surgeon: [name], MD;  Location: [place];  Service: General;  Laterality: Left;  . REMOVAL OF TISSUE EXPANDER AND PLACEMENT OF IMPLANT Left 03/28/2017   Procedure: REMOVAL OF TISSUE EXPANDER AND PLACEMENT OF IMPLANT;  Surgeon: [name], DO;  Location: [place];  Service: Plastics;  Laterality: Left;    There were no vitals filed for this visit.  Subjective Assessment - 10/22/17 1718    Subjective  Jadynn is nauseous today.  She is going to [facility] for a second opinion about medical treatment on [place] She reports discomfort under left axilla and wants to have treatment there    Pertinent History  Breast cancer diagnosed November 2016 She had chemotherapy. but did have some neuropathy so it had to be stopped. On June 8, she had a mastectomy with immedicate expander placement and with first set of lymphnodes removed, On  June 20 she went for complete axillary lymph node dissection. She says she has had always been "full necked" but has neck and upper body fullness since the cancer was diagnosed. She reports she has osteopenia in low back and has had a fracture that was discovered in 2014 when she had a Dexa scan. Pt has had metastasis to the bones of the spine and recent  bilateral breast surgery for a reduction on the right and expander removal on the left with implant placement and removal     Patient Stated Goals  to improve her dizziness and prevent future falls    Currently in Pain?  Yes    Pain Score  6     Pain Location  Axilla    Pain Orientation  Left                      [facility] Adult PT Treatment/Exercise - 10/22/17 0001      Manual Therapy   Soft tissue mobilization  with biotone to tight trigger points at left posterior axilla with prolonged pressure on tender spots with some relief perceived     Myofascial Release  to scar on left chest                PT Short Term Goals - 10/10/17 1717      PT SHORT TERM GOAL #1   Title  Pt will report that she has had 25% improvement in pain in left chest and axilla so that she can perform her kitchen tasks easier.     Time  8    Period  Weeks    Status  On-going      PT SHORT TERM GOAL #2   Title  Pt will report she is doing her arm range of motion exercises 5 times a week     Time  8    Period  Weeks    Status  On-going      Short Term Clinic Goals - 08/28/17 1118      CC Short Term Goal  #1   Title  Pt will report she knows strategies for good body mechanics to decrease low back pain     Status  Achieved      CC Short Term Goal  #2   Title  Pt will have a deacrease in circumference 15 cm prox to ulnar styloid of left forearm by 1 cm     Baseline  25 cm , on 05/16/2017 it is 23.5    Status  Achieved      CC Short Term Goal  #3   Title  Pt will report she knows how to manage left UE lymphedema with use of compression,  elevation and exercise, self manual lymph drainage and possibly Flexitouch     Baseline  Pt has day and nighttime garments and Flexitouch     Status  Achieved       PT Long Term Goals - 10/10/17 1717      PT LONG TERM GOAL #1   Title  Pt wants to improve her balance so that she feels more confident with her gait and not be fearful of tripping     Time  8    Period  Weeks    Status  On-going      PT LONG TERM GOAL #2   Title  Pt will have 150 degrees of shoulder flexion in supine indicating so that she will have greater ease in reaching activites in her daily life     Time  8    Period  Weeks    Status  On-going        Long Term Clinic Goals - 08/28/17 1119      CC Long Term Goal  #1   Title  Patient will report  that she can manage back pain  so she can perform daily activities with greater ease    Status  Achieved      CC Long Term Goal  #2   Title  Pt will  have given the TENS unit a trial to see if it will help with back pain     Baseline  has not needed to use TENS     Status  Deferred      CC Long Term Goal  #3   Title  Pt will be independent in core and  LE strengthening exercises       CC Long Term Goal  #4   Title  Pt will report low back pain is decreased to a 4/10 so that she can perform activities of daily living easier     Baseline  It depends on the day , but pt needs to take breaks and knows how to manage her pain, she feels this goal is achieved     Status  Achieved         Plan - 10/22/17 1724    Clinical Impression Statement  Pt is having recurrent soreness in fullness in left axilla.  She reported some relief after soft tissue work today     Clinical Impairments Affecting Rehab Potential  mulitple areas of diffuse bony metastasis, chemotherapy with neuropathy , radiation to back  and chest,    PT Frequency  2x / week    PT Duration  8 weeks    PT Treatment/Interventions  ADLs/Self Care Home Management;Manual lymph drainage;Compression  bandaging;Scar mobilization;Passive range of motion;Patient/family education;Taping;Manual techniques;Therapeutic exercise;Therapeutic activities;Orthotic Fit/Training;DME Instruction;Electrical Stimulation;Moist Heat;Gait training;Stair training;Functional mobility training;Balance training;Neuromuscular re-education;Vestibular    PT Next Visit Plan  Try TENS unit to see if it makes a difference in pain when pt is ative. contiue with exrcise for aerobics and LE strenth or Focus on myofascial work to left chest and manual lymph draiange to left axilla contine with kinesiotape prn;        Patient will benefit from skilled therapeutic intervention in order to improve the following deficits and impairments:  Decreased range of motion, Impaired UE functional use, Decreased activity tolerance, Decreased knowledge of precautions, Decreased skin integrity, Impaired perceived functional ability, Pain, Decreased knowledge of use of DME, Increased edema, Postural dysfunction, Decreased strength, Impaired sensation, Decreased scar mobility, Decreased mobility, Increased muscle spasms, Dizziness, Increased fascial restricitons, Decreased balance, Difficulty walking  Visit Diagnosis: Unsteadiness on feet  Abnormal posture  Localized swelling, mass and lump, neck  Chronic left shoulder pain  Stiffness  of left shoulder joint  Low back pain with sciatica, sciatica laterality unspecified, unspecified back pain laterality, unspecified chronicity  Repeated falls  Disorder of the skin and subcutaneous tissue related to radiation, unspecified  Other disturbances of skin sensation     Problem List Patient Active Problem List   Diagnosis Date Noted  . Pain from bone metastases ([place]) 09/11/2017  . Counseling regarding goals of care 04/16/2017  . Bone metastases ([place]) 02/28/2017  . Uncontrolled pain 02/28/2017  . Osteopenia determined by x-ray 09/20/2016  . Primary malignant neoplasm of left breast with  stage 2 nodal metastasis per American Joint Committee on Cancer 7th edition (N2) ([place]) 02/14/2016  . Chemotherapy-induced neuropathy ([place]) 12/30/2015  . Insomnia 09/01/2015  . Malignant neoplasm of upper-outer quadrant of left breast in female, estrogen receptor positive ([place]) 08/08/2015   Donato Heinz. Owens Shark PT  Norwood Levo 10/22/2017, 5:26 PM  [place], [place], [address] Phone: [phone]   Fax:  [phone]  Name: RUDY DOMEK MRN: [id number] Date of Birth: 1961/02/02

## 2017-10-24 ENCOUNTER — Encounter: Payer: 59 | Admitting: Physical Therapy

## 2017-10-24 ENCOUNTER — Other Ambulatory Visit: Payer: Self-pay | Admitting: Oncology

## 2017-10-28 ENCOUNTER — Encounter: Payer: 59 | Admitting: Physical Therapy

## 2017-10-29 ENCOUNTER — Ambulatory Visit: Payer: 59 | Admitting: Physical Therapy

## 2017-10-31 ENCOUNTER — Encounter: Payer: 59 | Admitting: Physical Therapy

## 2017-10-31 ENCOUNTER — Other Ambulatory Visit: Payer: Self-pay | Admitting: *Deleted

## 2017-10-31 DIAGNOSIS — C50412 Malignant neoplasm of upper-outer quadrant of left female breast: Secondary | ICD-10-CM

## 2017-10-31 DIAGNOSIS — Z170 Estrogen receptor positive status [ER+]: Principal | ICD-10-CM

## 2017-10-31 MED ORDER — OXYCODONE HCL 20 MG PO TABS: 20.0000 mg | ORAL_TABLET | ORAL | 0 refills | Status: DC | PRN

## 2017-10-31 MED ORDER — OXYCODONE HCL ER 80 MG PO T12A: 160.0000 mg | EXTENDED_RELEASE_TABLET | Freq: Three times a day (TID) | ORAL | 0 refills | Status: DC

## 2017-11-05 ENCOUNTER — Encounter: Payer: Self-pay | Admitting: Physical Therapy

## 2017-11-05 ENCOUNTER — Ambulatory Visit: Payer: 59 | Attending: General Surgery | Admitting: Physical Therapy

## 2017-11-05 DIAGNOSIS — L599 Disorder of the skin and subcutaneous tissue related to radiation, unspecified: Secondary | ICD-10-CM

## 2017-11-05 DIAGNOSIS — M25512 Pain in left shoulder: Secondary | ICD-10-CM | POA: Insufficient documentation

## 2017-11-05 DIAGNOSIS — G8929 Other chronic pain: Secondary | ICD-10-CM | POA: Diagnosis present

## 2017-11-05 DIAGNOSIS — R262 Difficulty in walking, not elsewhere classified: Secondary | ICD-10-CM | POA: Insufficient documentation

## 2017-11-05 DIAGNOSIS — R296 Repeated falls: Secondary | ICD-10-CM

## 2017-11-05 DIAGNOSIS — R293 Abnormal posture: Secondary | ICD-10-CM | POA: Insufficient documentation

## 2017-11-05 DIAGNOSIS — R221 Localized swelling, mass and lump, neck: Secondary | ICD-10-CM | POA: Diagnosis present

## 2017-11-05 DIAGNOSIS — M25612 Stiffness of left shoulder, not elsewhere classified: Secondary | ICD-10-CM | POA: Diagnosis present

## 2017-11-05 DIAGNOSIS — M5440 Lumbago with sciatica, unspecified side: Secondary | ICD-10-CM

## 2017-11-05 NOTE — Therapy (Signed)
[place], [place], [address] Phone: [phone]   Fax:  [phone]  Physical Therapy Treatment  Patient Details  Name: Alison [name] MRN: [id number] Date of Birth: 05/05/1961 Referring Provider: Gardenia Phlegm, NP   Encounter Date: 11/05/2017  PT End of Session - 11/05/17 1746    Visit Number  11    Number of Visits  34    Date for PT Re-Evaluation  01/03/18    PT Start Time  1435    PT Stop Time  1515    PT Time Calculation (min)  40 min       Past Medical History:  Diagnosis Date  . Anxiety   . Asthma     seasonal asthma  . Breast cancer ([place])    metastatic breast cancer  . Complication of anesthesia 2007   aspiration pneumonia post hysterectomy.  Blood pressure would drop low- but no problems 01/2016- surgery  . History of blood transfusion   . History of mitral valve prolapse 1988   with palpitations  . History of radiation therapy 04/02/16- 05/21/16   Left Chest Wall, SCV, Axilla, IM nodes  . Hypertension    no meds  . Neuropathy    with chemo  . Osteopenia determined by x-ray   . Pneumonia    1980's and 1990's  . Raynaud's disease   . 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 Skin cancer 03/12/2014   basal- back    Past Surgical History:  Procedure Laterality Date  . ABDOMINAL HYSTERECTOMY  2007   with BSO  . APPENDECTOMY  2007  . AXILLARY LYMPH NODE DISSECTION Left 02/14/2016   Procedure: LEFT AXILLARY LYMPH NODE DISSECTION;  Surgeon: [name], MD;  Location: [place];  Service: General;  Laterality: Left;  . BREAST RECONSTRUCTION WITH PLACEMENT OF TISSUE EXPANDER AND FLEX HD (ACELLULAR HYDRATED DERMIS) Left 02/02/2016   Procedure: IMMEDIATE LEFT BREAST RECONSTRUCTION WITH PLACEMENT OF TISSUE EXPANDER AND FLEX HD (ACELLULAR HYDRATED DERMIS);  Surgeon: [name], DO;  Location: [place];  Service: Plastics;   Laterality: Left;  . BREAST REDUCTION SURGERY Right 03/28/2017   Procedure: RIGHT MAMMARY REDUCTION  (BREAST);  Surgeon: [name], DO;  Location: [place];  Service: Plastics;  Laterality: Right;  . BREAST SURGERY Left 02/02/16   mastectomy with reconstruction  . c-section  1991  . COLON RESECTION SIGMOID  12/02/2012   [facility]   . HERNIA REPAIR  2007  . LAPAROSCOPIC ENDOMETRIOSIS FULGURATION  1984, 1989  . MASTOPEXY Right 03/28/2017   Procedure: MASTOPEXY;  Surgeon: [name], DO;  Location: [place];  Service: Plastics;  Laterality: Right;  . Port- a- cath placement  07/2015  . PORT-A-CATH REMOVAL Right 02/02/2016   Procedure: REMOVAL PORT-A-CATH;  Surgeon: [name], MD;  Location: [place];  Service: General;  Laterality: Right;  . RADIOACTIVE SEED GUIDED AXILLARY SENTINEL LYMPH NODE Left 02/02/2016   Procedure: LEFT BREAST MASTECTOMY AND RADIOACTIVE SEED GUIDED LEFT SENTINEL LYMPH NODE BIOPSY ;  Surgeon: [name], MD;  Location: [facility];  Service: General;  Laterality: Left;  . REMOVAL OF TISSUE EXPANDER AND PLACEMENT OF IMPLANT Left 03/28/2017   Procedure: REMOVAL OF TISSUE EXPANDER AND PLACEMENT OF IMPLANT;  Surgeon: [name], DO;  Location: [place];  Service: Plastics;  Laterality: Left;    There were no vitals filed  for this visit.  Subjective Assessment - 11/05/17 1743    Subjective  Alison [name] comes back today after several missed visits due to therapist visit and she had a second opinion.  She was happy with that visit and plans to continue with current treatment,  However, today she comes in with visible increased edema in neck , upper body and axilla.      Pertinent History  Breast cancer diagnosed November 2016 She had chemotherapy. but did have some neuropathy so it had to be stopped. On June 8, she had a mastectomy with immedicate expander placement and with first set  of lymphnodes removed, On June 20 she went for complete axillary lymph node dissection. She says she has had always been "full necked" but has neck and upper body fullness since the cancer was diagnosed. She reports she has osteopenia in low back and has had a fracture that was discovered in 2014 when she had a Dexa scan. Pt has had metastasis to the bones of the spine and recent  bilateral breast surgery for a reduction on the right and expander removal on the left with implant placement and removal     Patient Stated Goals  to improve her dizziness and prevent future falls    Currently in Pain?  Yes    Pain Score  6     Pain Orientation  Left    Pain Descriptors / Indicators  Aching                      [facility] Adult PT Treatment/Exercise - 11/05/17 0001      Manual Therapy   Myofascial Release  to scar on left chest     Manual Lymphatic Drainage (MLD)  short neck superficial and deep abdominals anterior interaxillary and left axillo inguinal anastamosis and left shoulder chest and back  extra time spent on axilla and tightness in abdomen                PT Short Term Goals - 11/05/17 1749      PT SHORT TERM GOAL #1   Title  Pt will report that she has had 25% improvement in pain in left chest and axilla so that she can perform her kitchen tasks easier.     Status  Achieved      PT SHORT TERM GOAL #2   Title  Pt will report she is doing her arm range of motion exercises 5 times a week     Baseline  exercise varies by how pt feels     Time  8    Period  Weeks    Status  On-going      Short Term Clinic Goals - 08/28/17 1118      CC Short Term Goal  #1   Title  Pt will report she knows strategies for good body mechanics to decrease low back pain     Status  Achieved      CC Short Term Goal  #2   Title  Pt will have a deacrease in circumference 15 cm prox to ulnar styloid of left forearm by 1 cm     Baseline  25 cm , on 05/16/2017 it is 23.5    Status  Achieved       CC Short Term Goal  #3   Title  Pt will report she knows how to manage left UE lymphedema with use of compression, elevation and exercise, self manual lymph drainage and  possibly Flexitouch     Baseline  Pt has day and nighttime garments and Flexitouch     Status  Achieved       PT Long Term Goals - 11/05/17 1748      PT LONG TERM GOAL #1   Title  Pt wants to improve her balance so that she feels more confident with her gait and not be fearful of tripping     Time  8    Period  Weeks    Status  Achieved      PT LONG TERM GOAL #2   Title  Pt will have 150 degrees of shoulder flexion in supine indicating so that she will have greater ease in reaching activites in her daily life     Baseline  varies as pt has decreased ROM when she has increased pain and swelling     Time  8    Period  Weeks    Status  On-going      PT LONG TERM GOAL #3   Title  Pt will report that she feels well enough that she can participate in social activities at least once a week     Time  [facility] Goals - 08/28/17 1119      CC Long Term Goal  #1   Title  Patient will report  that she can manage back pain  so she can perform daily activities with greater ease    Status  Achieved      CC Long Term Goal  #2   Title  Pt will  have given the TENS unit a trial to see if it will help with back pain     Baseline  has not needed to use TENS     Status  Deferred      CC Long Term Goal  #3   Title  Pt will be independent in core and  LE strengthening exercises       CC Long Term Goal  #4   Title  Pt will report low back pain is decreased to a 4/10 so that she can perform activities of daily living easier     Baseline  It depends on the day , but pt needs to take breaks and knows how to manage her pain, she feels this goal is achieved     Status  Achieved         Plan - 11/05/17 1752    Clinical Impression Statement  Alison [name] is finding that she has  more pain, swelling and general discomfort during the weeks that she is taking her verzinio and feels better on her off weeks.  She finds the manual PT beneficial during the weeks she is taking her med to help her function at home and even paritpate in socail activities.  On her off weeks, she can do strengthening and balance exercises to prevent falls and manage occasional back pain.  She is getting relief after every treatment and wants to keep coming as it helps her manage her daily life and stay invoved with her family and friends. Renewal sent today     Rehab Potential  Fair    Clinical Impairments Affecting Rehab Potential  mulitple areas of diffuse bony metastasis, chemotherapy with neuropathy , radiation to back  and chest,    PT Frequency  -- 1-2x  PT Duration  8 weeks    PT Treatment/Interventions  ADLs/Self Care Home Management;Manual lymph drainage;Compression bandaging;Scar mobilization;Passive range of motion;Patient/family education;Taping;Manual techniques;Therapeutic exercise;Therapeutic activities;Orthotic Fit/Training;DME Instruction;Electrical Stimulation;Moist Heat;Gait training;Stair training;Functional mobility training;Balance training;Neuromuscular re-education;Vestibular    PT Next Visit Plan  Try TENS unit to see if it makes a difference in pain when pt is ative. contiue with exrcise for aerobics and LE strenth or Focus on myofascial work to left chest and manual lymph draiange to left axilla contine with kinesiotape prn;     Consulted and Agree with Plan of Care  Patient       Patient will benefit from skilled therapeutic intervention in order to improve the following deficits and impairments:  Decreased range of motion, Impaired UE functional use, Decreased activity tolerance, Decreased knowledge of precautions, Decreased skin integrity, Impaired perceived functional ability, Pain, Decreased knowledge of use of DME, Increased edema, Postural dysfunction, Decreased strength,  Impaired sensation, Decreased scar mobility, Decreased mobility, Increased muscle spasms, Dizziness, Increased fascial restricitons, Decreased balance, Difficulty walking  Visit Diagnosis: Abnormal posture - Plan: PT plan of care cert/re-cert  Localized swelling, mass and lump, neck - Plan: PT plan of care cert/re-cert  Chronic left shoulder pain - Plan: PT plan of care cert/re-cert  Stiffness of left shoulder joint - Plan: PT plan of care cert/re-cert  Low back pain with sciatica, sciatica laterality unspecified, unspecified back pain laterality, unspecified chronicity - Plan: PT plan of care cert/re-cert  Repeated falls - Plan: PT plan of care cert/re-cert  Disorder of the skin and subcutaneous tissue related to radiation, unspecified - Plan: PT plan of care cert/re-cert  Difficulty in walking, not elsewhere classified - Plan: PT plan of care cert/re-cert     Problem List Patient Active Problem List   Diagnosis Date Noted  . Pain from bone metastases ([place]) 09/11/2017  . Counseling regarding goals of care 04/16/2017  . Bone metastases ([place]) 02/28/2017  . Uncontrolled pain 02/28/2017  . Osteopenia determined by x-ray 09/20/2016  . Primary malignant neoplasm of left breast with stage 2 nodal metastasis per American Joint Committee on Cancer 7th edition (N2) ([place]) 02/14/2016  . Chemotherapy-induced neuropathy ([place]) 12/30/2015  . Insomnia 09/01/2015  . Malignant neoplasm of upper-outer quadrant of left breast in female, estrogen receptor positive ([place]) 08/08/2015   Donato Heinz. Owens Shark PT  Norwood Levo 11/05/2017, [place], [place], [address] Phone: [phone]   Fax:  [phone]  Name: Alison [name] MRN: [id number] Date of Birth: 09/09/1960

## 2017-11-05 NOTE — Progress Notes (Signed)
[place]  [phone] [phone]) [phone]   ID: Alison [name] DOB: 09-02-1960  MR#: [id number]  OBS#:[id number]  Patient Care Team: [name], MD as PCP - General (Internal Medicine) Erroll Luna, MD as Consulting Physician (General Surgery) Magrinat, Virgie Dad, MD as Consulting Physician (Oncology) Aloha Gell, MD as Consulting Physician (Obstetrics and Gynecology) Harriett Sine, MD as Consulting Physician (Dermatology) Benson Norway, RN as Registered Nurse (Oncology) Eppie Gibson, MD as Attending Physician (Radiation Oncology) PCP: [name], MD OTHER MD:  CHIEF COMPLAINT: Estrogen receptor positive breast cancer  CURRENT TREATMENT: fulvestrant, abemaciclib, denosumab/Xgeva  INTERVAL HISTORY: Alison [name] returns today for a follow-up and treatment of her 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 She continues on fulvestrant, which she tolerates well. She does endorse mild hair thinning. She also states that depending on who administers her injection depends on how much pain she will feel afterwards.   She is also on abemaciclib, 100 mg twice daily, 2 weeks on and 2 weeks off.  She feels a little achy on the weeks on.  However she is able to do all her normal activities.  She receives denosumab/Xgeva every 28 days, with dose due today.  She has no side effects from that.  She continues take gabapentin at night to help prevent hot flashes.  She is also on trazodone, Ambien, and lorazepam to help her sleep.  That combination is actually working well at present  Since her last visit here Alison [name] was seen by Dr. Janan Halter at [facility].  Dr. Hiram Comber approved of the current treatment, and suggested that if there is eventual disease progression she can be seen there again to consider other treatments.  The patient turned out not to be eligible for HARMONY (molecular subtyping and first-line therapy), but did get enrolled in a DNA  sequencing study.   REVIEW OF SYSTEMS: Alison [name] has been exercising more and she has become more social. She is planning a trip to [place] with some friends. She reports that she has been sleeping well. She denies unusual headaches, visual changes, nausea, vomiting, or dizziness. There has been no unusual cough, phlegm production, or pleurisy. This been no change in bowel or bladder habits. She denies unexplained fatigue or unexplained weight loss, bleeding, rash, or fever. A detailed review of systems was otherwise noncontributory.   BREAST CANCER HISTORY: From the original intake note:  Seryna had routine screening mammography with tomography at [facility] February 20 11/14/2014 showing a possible mass in the right breast. Diagnostic right mammography with right breast ultrasonography 11/01/2014 found the breast density to be category C. In the upper outer quadrant of the right breast there was a partially obscured mass which on physical exam was palpable as an area of thickening at the 10:00 position 8 cm from the nipple. Ultrasound showed multiple cysts in the upper outer right breast corresponding to the mass in question.  In November 2016 the patient felt a change in the upper left breast and brought it to her primary care physician's attention. Diagnostic left mammography with tomosynthesis and left breast ultrasonography at the [facility] December 01/14/2015 found trabecular thickening throughout the left breast with skin thickening, but no circumscribed mass or suspicious calcifications. A rounded mass in the left axilla measured 6 mm. There was an additional mildly prominent lymph node in the left axilla which was what the patient had been palpating. Ultrasonography showed ill-defined swelling throughout the inner left breast with overlying skin thickening.Marland Kitchen At  the 2:00 position 4 cm from the nipple there was an irregular hypoechoic mass measuring 2.3 cm. A second mass at the 1:00 area 3  cm from the nipple measured 1 cm. The distance between these 2 masses was 1.8 cm. There was also a round hypoechoic left axillary mass measuring 0.6 cm. Overall the was an area of 3.7 cm of mixed echogenicity corresponding to the area of palpable soft tissue swelling.   On 08/04/2015 the patient underwent biopsy of the mass at the 2:00 axis 4 cm from the nipple and a second biopsy was performed of the hypoechoic mass at the 1:00 position 3 cm from the nipple. Biopsy of the suspicious nodule in the lower left axilla was attempted but could not be completed as the mass became obscured after administration of lidocaine.  The pathology from both left breast biopsies 08/04/2015 ([id number]) showed invasive ductal carcinoma, grade 3. The prognostic profile obtained from the larger tumor showed estrogen receptor to be strongly positive at 95%, progesterone receptor positive at 30%, with an MIB-1 of 12%, and no HER-2 amplification, the signals ratio being 1.22 and the number per cell 2.20.  On 08/09/2015 the patient underwent biopsy of this suspicious left axillary lymph node noted above, and this showed ([id number]) metastatic carcinoma with extracapsular extension.  The patient's subsequent history is as detailed below    PAST MEDICAL HISTORY: Past Medical History:  Diagnosis Date  . Anxiety   . Asthma     seasonal asthma  . Breast cancer ([place])    metastatic breast cancer  . Complication of anesthesia 2007   aspiration pneumonia post hysterectomy.  Blood pressure would drop low- but no problems 01/2016- surgery  . History of blood transfusion   . History of mitral valve prolapse 1988   with palpitations  . History of radiation therapy 04/02/16- 05/21/16   Left Chest Wall, SCV, Axilla, IM nodes  . Hypertension    no meds  . Neuropathy    with chemo  . Osteopenia determined by x-ray   . Pneumonia    1980's and 1990's  . Raynaud's disease   . 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 Skin cancer 03/12/2014   basal- back    PAST SURGICAL HISTORY: Past Surgical History:  Procedure Laterality Date  . ABDOMINAL HYSTERECTOMY  2007   with BSO  . APPENDECTOMY  2007  . AXILLARY LYMPH NODE DISSECTION Left 02/14/2016   Procedure: LEFT AXILLARY LYMPH NODE DISSECTION;  Surgeon: [name], MD;  Location: [place];  Service: General;  Laterality: Left;  . BREAST RECONSTRUCTION WITH PLACEMENT OF TISSUE EXPANDER AND FLEX HD (ACELLULAR HYDRATED DERMIS) Left 02/02/2016   Procedure: IMMEDIATE LEFT BREAST RECONSTRUCTION WITH PLACEMENT OF TISSUE EXPANDER AND FLEX HD (ACELLULAR HYDRATED DERMIS);  Surgeon: [name], DO;  Location: [place];  Service: Plastics;  Laterality: Left;  . BREAST REDUCTION SURGERY Right 03/28/2017   Procedure: RIGHT MAMMARY REDUCTION  (BREAST);  Surgeon: [name], DO;  Location: [place];  Service: Plastics;  Laterality: Right;  . BREAST SURGERY Left 02/02/16   mastectomy with reconstruction  . c-section  1991  . COLON RESECTION SIGMOID  12/02/2012   [facility]   . HERNIA REPAIR  2007  . LAPAROSCOPIC ENDOMETRIOSIS FULGURATION  1984, 1989  . MASTOPEXY Right 03/28/2017   Procedure: MASTOPEXY;  Surgeon: [name], DO;  Location: [place];  Service: Plastics;  Laterality: Right;  .  Port- a- cath placement  07/2015  . PORT-A-CATH REMOVAL Right 02/02/2016   Procedure: REMOVAL PORT-A-CATH;  Surgeon: [name], MD;  Location: [place];  Service: General;  Laterality: Right;  . RADIOACTIVE SEED GUIDED AXILLARY SENTINEL LYMPH NODE Left 02/02/2016   Procedure: LEFT BREAST MASTECTOMY AND RADIOACTIVE SEED GUIDED LEFT SENTINEL LYMPH NODE BIOPSY ;  Surgeon: [name], MD;  Location: [place];  Service: General;  Laterality: Left;  . REMOVAL OF TISSUE EXPANDER AND PLACEMENT OF IMPLANT Left 03/28/2017   Procedure: REMOVAL OF TISSUE EXPANDER  AND PLACEMENT OF IMPLANT;  Surgeon: [name], DO;  Location: [place];  Service: Plastics;  Laterality: Left;    FAMILY HISTORY Family History  Problem Relation Age of Onset  . Lung cancer Father    the patient's father died from lung cancer at the age of 104 in the setting of tobacco abuse. The patient's mother died at the age of 65 with pancreatic cancer. The patient had no siblings. There is no history of breast or ovarian cancer in the family.  GYNECOLOGIC HISTORY:  No LMP recorded. Patient has had a hysterectomy. Menarche age 46, first live birth age 60. The patient is GX P1. She is status post total hysterectomy with bilateral salpingo-oophorectomy. She has been on hormone replacement since that time and is tapering to off at the time of this dictation.   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 She used to work at [employer]. Her husband Alison [name] works for [employer] division at a [employer] their son Alison [name] is currently at home.    ADVANCED DIRECTIVES: Not in place   HEALTH MAINTENANCE: Social History   Tobacco Use  . Smoking status: Former Smoker    Packs/day: 1.00    Years: 10.00    Pack years: 10.00    Types: Cigarettes    Last attempt to quit: 11/25/2008    Years since quitting: 8.9  . Smokeless tobacco: Never Used  . Tobacco comment: on and off smoker never consistent  Substance Use Topics  . Alcohol use: Yes    Comment: social  . Drug use: No     Colonoscopy: April 2014/ Raynaldo Opitz at [facility]  PAP: s/p hysterectomy  Bone density: August 2016/ osteopenia  Lipid panel:  Allergies  Allergen Reactions  . Sulfa Antibiotics Other (See Comments)    Blisters in mouth    Current Outpatient Medications  Medication Sig Dispense Refill  . abemaciclib (VERZENIO) 100 MG tablet Take 1 tablet (100 mg total) by mouth 2 (two) times daily. Pt to take 2 weeks on then off x 2 weeks 30 tablet 3  . Ascorbic Acid (VITAMIN  C) 1000 MG tablet Take 1,000 mg by mouth 2 (two) times daily.    . cetirizine (ZYRTEC) 10 MG tablet Take 10 mg by mouth daily.    . Cholecalciferol (VITAMIN D3) 10000 units capsule Take 10,000 Units by mouth daily.     Marland Kitchen escitalopram (LEXAPRO) 20 MG tablet Take 1 tablet (20 mg total) by mouth daily. 90 tablet 4  . fluticasone (FLONASE) 50 MCG/ACT nasal spray Place 2 sprays into both nostrils daily. Reported on 11/18/2015    . gabapentin (NEURONTIN) 300 MG capsule Take 1 capsule (300 mg total) by mouth at bedtime as needed. Take 2 tablets in AM, 2 tablets mid-day, and 4 tablets at bedtime 90 capsule 4  . LORazepam (ATIVAN) 0.5 MG tablet Take 1 tablet (0.5 mg total)  by mouth at bedtime. 90 tablet 3  . Multiple Vitamin (MULTIVITAMIN) tablet Take 1 tablet by mouth daily.    . ondansetron (ZOFRAN) 8 MG tablet Take 1 tablet (8 mg total) by mouth every 8 (eight) hours as needed for nausea or vomiting. 20 tablet 5  . oxyCODONE (OXYCONTIN) 80 mg 12 hr tablet Take 2 tablets (160 mg total) by mouth every 8 (eight) hours. 120 tablet 0  . Oxycodone HCl 20 MG TABS Take 1-2 tablets (20-40 mg total) by mouth every 4 (four) hours as needed. 180 tablet 0  . Probiotic Product (PROBIOTIC DAILY PO) Take 1 capsule by mouth daily. [place] prochlorperazine (COMPAZINE) 10 MG tablet Take 1 tablet (10 mg total) by mouth every 6 (six) hours as needed (Nausea or vomiting). 90 tablet 2  . traZODone (DESYREL) 50 MG tablet TAKE 3 TABLETS AT BEDTIME 90 tablet 2  . UNABLE TO FIND [place]    . UNABLE TO FIND [place] mushroom    . UNABLE TO FIND [place]    . zolpidem (AMBIEN) 5 MG tablet Take 1 tablet (5 mg total) by mouth at bedtime as needed for sleep. for sleep 30 tablet 2   No current facility-administered medications for this visit.     OBJECTIVE: Middle-aged white woman in no acute distress  Vitals:   11/06/17 0930  BP: 135/82  Pulse: 97  Resp: 18  Temp: 97.8 F  (36.6 C)  SpO2: 100%     Body mass index is 27.69 kg/m.    ECOG FS:1 - Symptomatic but completely ambulatory   Sclerae unicteric, EOMs intact Oropharynx clear and moist No cervical or supraclavicular adenopathy Lungs no rales or rhonchi Heart regular rate and rhythm Abd soft, nontender, positive bowel sounds--truncal obesity  MSK no focal spinal tenderness, no upper extremity lymphedema Neuro: nonfocal, well oriented, appropriate affect Breasts: The right breast is unremarkable.  The left breast is status post mastectomy.  There is no evidence of local recurrence.  Both axillae are benign.  LAB RESULTS:  CMP     Component Value Date/Time   NA 140 10/09/2017 1332   NA 138 08/14/2017 1042   K 4.1 10/09/2017 1332   K 4.2 08/14/2017 1042   CL 104 10/09/2017 1332   CO2 25 10/09/2017 1332   CO2 26 08/14/2017 1042   GLUCOSE 112 10/09/2017 1332   GLUCOSE 129 08/14/2017 1042   BUN 12 10/09/2017 1332   BUN 8.3 08/14/2017 1042   CREATININE 0.87 10/09/2017 1332   CREATININE 0.74 09/11/2017 1445   CREATININE 0.8 08/14/2017 1042   CALCIUM 8.8 10/09/2017 1332   CALCIUM 9.6 08/14/2017 1042   PROT 7.0 10/09/2017 1332   PROT 6.5 08/14/2017 1042   ALBUMIN 3.6 10/09/2017 1332   ALBUMIN 3.4 (L) 08/14/2017 1042   AST 46 (H) 10/09/2017 1332   AST 45 (H) 09/11/2017 1445   AST 63 (H) 08/14/2017 1042   ALT 37 10/09/2017 1332   ALT 42 09/11/2017 1445   ALT 53 08/14/2017 1042   ALKPHOS 98 10/09/2017 1332   ALKPHOS 84 08/14/2017 1042   BILITOT 0.4 10/09/2017 1332   BILITOT 0.4 09/11/2017 1445   BILITOT 0.44 08/14/2017 1042   GFRNONAA >60 10/09/2017 1332   GFRNONAA >60 09/11/2017 1445   GFRAA >60 10/09/2017 1332   GFRAA >60 09/11/2017 1445    INo results found for: SPEP, UPEP  Lab Results  Component Value Date   WBC  2.4 (L) 11/06/2017   NEUTROABS 1.3 (L) 11/06/2017   HGB 11.7 11/06/2017   HCT 35.2 11/06/2017   MCV 102.9 (H) 11/06/2017   PLT 166 11/06/2017      Chemistry        Component Value Date/Time   NA 140 10/09/2017 1332   NA 138 08/14/2017 1042   K 4.1 10/09/2017 1332   K 4.2 08/14/2017 1042   CL 104 10/09/2017 1332   CO2 25 10/09/2017 1332   CO2 26 08/14/2017 1042   BUN 12 10/09/2017 1332   BUN 8.3 08/14/2017 1042   CREATININE 0.87 10/09/2017 1332   CREATININE 0.74 09/11/2017 1445   CREATININE 0.8 08/14/2017 1042      Component Value Date/Time   CALCIUM 8.8 10/09/2017 1332   CALCIUM 9.6 08/14/2017 1042   ALKPHOS 98 10/09/2017 1332   ALKPHOS 84 08/14/2017 1042   AST 46 (H) 10/09/2017 1332   AST 45 (H) 09/11/2017 1445   AST 63 (H) 08/14/2017 1042   ALT 37 10/09/2017 1332   ALT 42 09/11/2017 1445   ALT 53 08/14/2017 1042   BILITOT 0.4 10/09/2017 1332   BILITOT 0.4 09/11/2017 1445   BILITOT 0.44 08/14/2017 1042       No results found for: LABCA2  No components found for: LABCA125  No results for input(s): INR in the last 168 hours.  Urinalysis    Component Value Date/Time   COLORURINE YELLOW 11/12/2015 [place] 11/12/2015 0837   LABSPEC 1.005 03/18/2017 1223   PHURINE 6.0 03/18/2017 1223   PHURINE 6.5 11/12/2015 0837   GLUCOSEU Negative 03/18/2017 1223   HGBUR Large 03/18/2017 1223   HGBUR MODERATE (A) 11/12/2015 0837   BILIRUBINUR Negative 03/18/2017 1223   KETONESUR Negative 03/18/2017 1223   [place] 11/12/2015 0837   PROTEINUR Negative 03/18/2017 1223   PROTEINUR NEGATIVE 11/12/2015 0837   UROBILINOGEN 0.2 03/18/2017 1223   NITRITE Negative 03/18/2017 1223   NITRITE NEGATIVE 11/12/2015 0837   LEUKOCYTESUR Moderate 03/18/2017 1223    STUDIES: Left hip and pelvis films obtained at [facility]: Result Impression  Numerous nodular sclerotic lesions throughout the pelvis and lumbar spine as above, consistent with metastatic disease. No evidence for pathologic fracture.  Result Narrative  EXAM: XR HIP W PELVIS LEFT DATE: 10/24/2017 3:14 PM ACCESSION: [id number] UN DICTATED: 10/24/2017 3:14  PM INTERPRETATION LOCATION: [place]  CLINICAL INDICATION: 57 years old Female with bone mets - left iliac wing C50.919-Carcinoma of breast metastatic to bone, unspecified laterality (CMS-HCC)  COMPARISON: PET CT 09/05/2017  TECHNIQUE: AP views of the pelvis and left hip and frog leg lateral view of the left hip.  FINDINGS:  No acute fracture. Alignment is normal. Hip joint spaces are maintained. Multiple patchy ill-defined and rounded nodular sclerotic lesions noted throughout the left ilium, left pubis and left ischium, anterior right ilium, and sacrum. SI joints and pubic symphysis are approximated. Additional lesions noted within the lumbar spine. Surgical clips and suture material noted within the pelvis.    PROTOCOLS:  participated in [place], ABC and PALLAS studies, currently off study   ASSESSMENT: 57 y.o. [place] woman status post biopsy of 2 separate masses in the left breast upper outer quadrant 08/04/2015, clinically mT2 N1, stage IIb/IIIa invasive ductal carcinoma, grade 3, the larger mass being estrogen and progesterone receptor positive, HER-2 negative, with an MIB-1 of 12%  (a) biopsy of a left axillary lymph node 08/09/2015 was positive, with extracapsular extension  (1) neoadjuvant chemotherapy started 08/25/2015 consisting of  doxorubicin and cyclophosphamide in dose dense fashion 4, completed 10/06/2015,  followed by weekly paclitaxel 10 completed 12/23/2015  (a) final 2 planned cycles of weekly paclitaxel omitted because of neuropathy concerns  (2) status post left mastectomy with targeted axillary dissection 02/02/2016 for a ypT2 ypN2a, stage IIIa invasive ductal carcinoma, grade 2, with negative margins, repeat prognostic panel showing estrogen receptor positive, but progesterone receptor and HER-2 negative  (a) completion axillary lymph node dissection 02/14/2016 showed an additional 2 out of 8 lymph nodes to be involved (final count 6 out of 10 lymph nodes  positive  (b) expander placement at the time of left mastectomy  (3) adjuvant capecitabine starting concurrently with radiation--discontinued 05/21/2016  (4) adjuvant  radiation started 04/02/2016, completed 05/21/2016  (5) anastrozole started October 2017, discontinued January 2019  (a) bone density August 2016 showed osteopenia  (b) repeat bone density 09/07/2016 shows a T score of -1.7.  (c) the patient is status post total abdominal hysterectomy with bilateral salpingo-oophorectomy.  (6) postoperative pain?: Starting OxyContin 20 mg twice a day with oxycodone as needed for breakthrough pain 03/02/2016  (a) failed transition to tramadol, gabapentin, naproxen   (b) started methadone 5 mg TID as of 12/20/2016-- not tolerated  (c) robaxin added June 2018 to NSAIDS and tylenol  (d) on OxyContin and Oxycodone as discussed below  (7) Research:  (a) B-WEL study ([facility] [id number])--enrolled in [place] 1 (received education)  (b) PALLAS AFT-05 study: randomized to treatment, started palbociclib 07/26/2016   (i) dose reduced to 100 mg, then to 75 mg because of cytopenias   (ii) went off study study May 2018 because of persistent cytopenias  (c) taxane study 12/20/2016  (d) aspirin study  (e) went off studies as metastatic disease developed in June 2018  METASTATIC DISEASE 02/15/2017: CT scan of the lumbar spine and 02/27/2017 CT of the thoracic and cervical spine shows multiple bone lesions but no evidence of cord compromise; bone lesions are confirmed on PET scan 02/22/2017 which however shows no evidence of visceral disease  (a) CA-27-29 is noninformative  (b) repeat PET scan 09/05/2017 shows again no visual disease, stable bony metastases  (8) continuing anastrozole, abemaciclib added 02/22/2017, taken 2 weeks on, 2 weeks off   (a) fulvestrant started 02/22/2017 ,   (b) anastrozole discontinued January 2019  (9) yearly zolendronate given 09/26/2016, changed to monthly denosumab/Xgeva  starting 03/19/2017  (10) radiation 04/01/2017 - 04/23/2017 Site/dose:   The lumbar spine was treated to 35 Gy in 14 fractions of 2.5 Gy.   Other issues: (a) poorly controlled pain: Currently on OxyContin 160 grams p.o. TID/ oxycodone 20-40 mg Q 6h PRN  (i) bowel prophylaxis in place  (b) left upper lobe changes noted on PET scan 02/22/2017 felt to be secondary to radiation  PLAN: Goldy will soon be a year out from definitive diagnosis of metastatic breast cancer.  Her disease is currently well controlled clinically.  She is tolerating the monthly fulvestrant and denosumab and the daily abemaciclib (2 weeks on 2 weeks off) generally well.  What has really made a difference to her functional status however is better pain control.  She is now very functional, able to meet with friends, travel to [place] and elsewhere, and in general lead much more of a normal life.  I have checked in PMP aware and we are the only physicians currently prescribing any narcotics for her  We are going to continue the current treatment.  She will have a restaging PET scan in June.  At that time it may be prudent to proceed to bone marrow biopsy at suggested by Dr. Hiram Comber.   We have discussed issues relating possible overdose concerns and also oversedation concerns regarding driving.  Otherwise I am delighted that she is doing as well as she is doing.  She will see me again in April and then again in June as already scheduled.  Alison [name], Virgie Dad, MD  11/06/17 9:55 AM Medical Oncology and Hematology [facility] [address] [place], [place] [address] Tel. [phone]    Fax. [phone]  This document serves as a record of services personally performed by Sullivan Lone, MD. It was created on his behalf by Margit Banda, a trained medical scribe. The creation of this record is based on the scribe's personal observations and the provider's statements to them.   I have reviewed the above  documentation for accuracy and completeness, and I agree with the above.

## 2017-11-06 ENCOUNTER — Telehealth: Payer: Self-pay | Admitting: Oncology

## 2017-11-06 ENCOUNTER — Other Ambulatory Visit: Payer: 59

## 2017-11-06 ENCOUNTER — Inpatient Hospital Stay: Payer: 59

## 2017-11-06 ENCOUNTER — Inpatient Hospital Stay (HOSPITAL_BASED_OUTPATIENT_CLINIC_OR_DEPARTMENT_OTHER): Payer: 59 | Admitting: Oncology

## 2017-11-06 ENCOUNTER — Inpatient Hospital Stay: Payer: 59 | Attending: Oncology

## 2017-11-06 ENCOUNTER — Ambulatory Visit: Payer: 59

## 2017-11-06 VITALS — BP 135/82 | HR 97 | Temp 97.8°F | Resp 18 | Ht 62.0 in | Wt 151.4 lb

## 2017-11-06 DIAGNOSIS — Z170 Estrogen receptor positive status [ER+]: Secondary | ICD-10-CM

## 2017-11-06 DIAGNOSIS — Z87891 Personal history of nicotine dependence: Secondary | ICD-10-CM

## 2017-11-06 DIAGNOSIS — N951 Menopausal and female climacteric states: Secondary | ICD-10-CM

## 2017-11-06 DIAGNOSIS — C7951 Secondary malignant neoplasm of bone: Secondary | ICD-10-CM | POA: Insufficient documentation

## 2017-11-06 DIAGNOSIS — G893 Neoplasm related pain (acute) (chronic): Secondary | ICD-10-CM | POA: Insufficient documentation

## 2017-11-06 DIAGNOSIS — G629 Polyneuropathy, unspecified: Secondary | ICD-10-CM | POA: Insufficient documentation

## 2017-11-06 DIAGNOSIS — C779 Secondary and unspecified malignant neoplasm of lymph node, unspecified: Secondary | ICD-10-CM | POA: Insufficient documentation

## 2017-11-06 DIAGNOSIS — C50912 Malignant neoplasm of unspecified site of left female breast: Secondary | ICD-10-CM

## 2017-11-06 DIAGNOSIS — C50412 Malignant neoplasm of upper-outer quadrant of left female breast: Secondary | ICD-10-CM | POA: Diagnosis not present

## 2017-11-06 DIAGNOSIS — Z79899 Other long term (current) drug therapy: Secondary | ICD-10-CM | POA: Diagnosis not present

## 2017-11-06 DIAGNOSIS — M8580 Other specified disorders of bone density and structure, unspecified site: Secondary | ICD-10-CM | POA: Diagnosis not present

## 2017-11-06 DIAGNOSIS — F17210 Nicotine dependence, cigarettes, uncomplicated: Secondary | ICD-10-CM | POA: Insufficient documentation

## 2017-11-06 DIAGNOSIS — Z5111 Encounter for antineoplastic chemotherapy: Secondary | ICD-10-CM | POA: Insufficient documentation

## 2017-11-06 LAB — CBC WITH DIFFERENTIAL/PLATELET
BASOS ABS: 0 10*3/uL (ref 0.0–0.1)
Basophils Relative: 1 %
Eosinophils Absolute: 0.2 10*3/uL (ref 0.0–0.5)
Eosinophils Relative: 6 %
HCT: 35.2 % (ref 34.8–46.6)
HEMOGLOBIN: 11.7 g/dL (ref 11.6–15.9)
Lymphocytes Relative: 27 %
Lymphs Abs: 0.7 10*3/uL — ABNORMAL LOW (ref 0.9–3.3)
MCH: 34.2 pg — AB (ref 25.1–34.0)
MCHC: 33.2 g/dL (ref 31.5–36.0)
MCV: 102.9 fL — AB (ref 79.5–101.0)
Monocytes Absolute: 0.3 10*3/uL (ref 0.1–0.9)
Monocytes Relative: 11 %
Neutro Abs: 1.3 10*3/uL — ABNORMAL LOW (ref 1.5–6.5)
Neutrophils Relative %: 55 %
Platelets: 166 10*3/uL (ref 145–400)
RBC: 3.42 MIL/uL — AB (ref 3.70–5.45)
RDW: 14.3 % (ref 11.2–14.5)
WBC: 2.4 10*3/uL — ABNORMAL LOW (ref 3.9–10.3)

## 2017-11-06 LAB — COMPREHENSIVE METABOLIC PANEL
ALK PHOS: 116 U/L (ref 40–150)
ALT: 46 U/L (ref 0–55)
AST: 50 U/L — AB (ref 5–34)
Albumin: 3.4 g/dL — ABNORMAL LOW (ref 3.5–5.0)
Anion gap: 11 (ref 3–11)
BUN: 8 mg/dL (ref 7–26)
CALCIUM: 9.3 mg/dL (ref 8.4–10.4)
CHLORIDE: 103 mmol/L (ref 98–109)
CO2: 26 mmol/L (ref 22–29)
CREATININE: 0.76 mg/dL (ref 0.60–1.10)
Glucose, Bld: 106 mg/dL (ref 70–140)
Potassium: 4.4 mmol/L (ref 3.5–5.1)
Sodium: 140 mmol/L (ref 136–145)
Total Bilirubin: 0.3 mg/dL (ref 0.2–1.2)
Total Protein: 7 g/dL (ref 6.4–8.3)

## 2017-11-06 MED ORDER — FULVESTRANT 250 MG/5ML IM SOLN
INTRAMUSCULAR | Status: AC
  Filled 2017-11-06: qty 5

## 2017-11-06 MED ORDER — DENOSUMAB 120 MG/1.7ML ~~LOC~~ SOLN
120.0000 mg | Freq: Once | SUBCUTANEOUS | Status: AC
  Administered 2017-11-06: 120 mg via SUBCUTANEOUS

## 2017-11-06 MED ORDER — DENOSUMAB 120 MG/1.7ML ~~LOC~~ SOLN
SUBCUTANEOUS | Status: AC
Start: 2017-11-06 — End: 2017-11-06
  Filled 2017-11-06: qty 1.7

## 2017-11-06 MED ORDER — FULVESTRANT 250 MG/5ML IM SOLN
500.0000 mg | Freq: Once | INTRAMUSCULAR | Status: AC
  Administered 2017-11-06: 500 mg via INTRAMUSCULAR

## 2017-11-06 NOTE — Telephone Encounter (Signed)
Gave patient AVs and calendar of march through June appointments.

## 2017-11-07 ENCOUNTER — Encounter: Payer: 59 | Admitting: Physical Therapy

## 2017-11-12 ENCOUNTER — Encounter: Payer: 59 | Admitting: Physical Therapy

## 2017-11-14 ENCOUNTER — Ambulatory Visit: Payer: 59 | Admitting: Physical Therapy

## 2017-11-19 ENCOUNTER — Encounter: Payer: Self-pay | Admitting: Physical Therapy

## 2017-11-19 ENCOUNTER — Ambulatory Visit: Payer: 59 | Admitting: Physical Therapy

## 2017-11-19 DIAGNOSIS — M25612 Stiffness of left shoulder, not elsewhere classified: Secondary | ICD-10-CM

## 2017-11-19 DIAGNOSIS — L599 Disorder of the skin and subcutaneous tissue related to radiation, unspecified: Secondary | ICD-10-CM

## 2017-11-19 DIAGNOSIS — R293 Abnormal posture: Secondary | ICD-10-CM | POA: Diagnosis not present

## 2017-11-19 DIAGNOSIS — M25512 Pain in left shoulder: Secondary | ICD-10-CM

## 2017-11-19 DIAGNOSIS — G8929 Other chronic pain: Secondary | ICD-10-CM

## 2017-11-19 DIAGNOSIS — R221 Localized swelling, mass and lump, neck: Secondary | ICD-10-CM

## 2017-11-19 DIAGNOSIS — M5440 Lumbago with sciatica, unspecified side: Secondary | ICD-10-CM

## 2017-11-19 NOTE — Therapy (Signed)
[place], [place], [address] Phone: [phone]   Fax:  [phone]  Physical Therapy Treatment  Patient Details  Name: Alison [name] MRN: [id number] Date of Birth: 09-10-1960 Referring Provider: Gardenia Phlegm, NP   Encounter Date: 11/19/2017  PT End of Session - 11/19/17 1536    Visit Number  12    Number of Visits  34    Date for PT Re-Evaluation  01/03/18    PT Start Time  1440    PT Stop Time  1530    PT Time Calculation (min)  50 min    Activity Tolerance  Patient tolerated treatment well       Past Medical History:  Diagnosis Date  . Anxiety   . Asthma     seasonal asthma  . Breast cancer ([place])    metastatic breast cancer  . Complication of anesthesia 2007   aspiration pneumonia post hysterectomy.  Blood pressure would drop low- but no problems 01/2016- surgery  . History of blood transfusion   . History of mitral valve prolapse 1988   with palpitations  . History of radiation therapy 04/02/16- 05/21/16   Left Chest Wall, SCV, Axilla, IM nodes  . Hypertension    no meds  . Neuropathy    with chemo  . Osteopenia determined by x-ray   . Pneumonia    1980's and 1990's  . Raynaud's disease   . 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 Skin cancer 03/12/2014   basal- back    Past Surgical History:  Procedure Laterality Date  . ABDOMINAL HYSTERECTOMY  2007   with BSO  . APPENDECTOMY  2007  . AXILLARY LYMPH NODE DISSECTION Left 02/14/2016   Procedure: LEFT AXILLARY LYMPH NODE DISSECTION;  Surgeon: [name], MD;  Location: [facility];  Service: General;  Laterality: Left;  . BREAST RECONSTRUCTION WITH PLACEMENT OF TISSUE EXPANDER AND FLEX HD (ACELLULAR HYDRATED DERMIS) Left 02/02/2016   Procedure: IMMEDIATE LEFT BREAST RECONSTRUCTION WITH PLACEMENT OF TISSUE EXPANDER AND FLEX HD (ACELLULAR HYDRATED DERMIS);  Surgeon: [name], DO;   Location: [facility];  Service: Plastics;  Laterality: Left;  . BREAST REDUCTION SURGERY Right 03/28/2017   Procedure: RIGHT MAMMARY REDUCTION  (BREAST);  Surgeon: [name], DO;  Location: [facility];  Service: Plastics;  Laterality: Right;  . BREAST SURGERY Left 02/02/16   mastectomy with reconstruction  . c-section  1991  . COLON RESECTION SIGMOID  12/02/2012   [facility]   . HERNIA REPAIR  2007  . LAPAROSCOPIC ENDOMETRIOSIS FULGURATION  1984, 1989  . MASTOPEXY Right 03/28/2017   Procedure: MASTOPEXY;  Surgeon: [name], DO;  Location: [facility];  Service: Plastics;  Laterality: Right;  . Port- a- cath placement  07/2015  . PORT-A-CATH REMOVAL Right 02/02/2016   Procedure: REMOVAL PORT-A-CATH;  Surgeon: [name], MD;  Location: [facility];  Service: General;  Laterality: Right;  . RADIOACTIVE SEED GUIDED AXILLARY SENTINEL LYMPH NODE Left 02/02/2016   Procedure: LEFT BREAST MASTECTOMY AND RADIOACTIVE SEED GUIDED LEFT SENTINEL LYMPH NODE BIOPSY ;  Surgeon: [name], MD;  Location: [facility];  Service: General;  Laterality: Left;  . REMOVAL OF TISSUE EXPANDER AND PLACEMENT OF IMPLANT Left 03/28/2017   Procedure: REMOVAL OF TISSUE EXPANDER AND PLACEMENT OF IMPLANT;  Surgeon: [name], DO;  Location: [facility];  Service: Plastics;  Laterality: Left;    There were no vitals filed for this visit.  Subjective Assessment - 11/19/17 1533    Subjective  Pt reports she has had a lot of sharp pains in her left axilla and arm over the weekend, but it is better today.  "Its just so tight"     Pertinent History  Breast cancer diagnosed November 2016 She had chemotherapy. but did have some neuropathy so it had to be stopped. On June 8, she had a mastectomy with immedicate expander placement and with first set of lymphnodes removed, On June 20 she went for complete axillary lymph node  dissection. She says she has had always been "full necked" but has neck and upper body fullness since the cancer was diagnosed. She reports she has osteopenia in low back and has had a fracture that was discovered in 2014 when she had a Dexa scan. Pt has had metastasis to the bones of the spine and recent  bilateral breast surgery for a reduction on the right and expander removal on the left with implant placement and removal     Patient Stated Goals  to improve her dizziness and prevent future falls    Currently in Pain?  Yes    Pain Score  -- did not rate, but pt appears uncomfortable    Pain Location  Axilla    Pain Orientation  Left    Pain Descriptors / Indicators  Aching;Sharp                No data recorded       [facility] Adult PT Treatment/Exercise - 11/19/17 0001      Manual Therapy   Manual therapy comments  extra time spent on tight areas at left chest and axilla     Myofascial Release  to scar on left chest     Manual Lymphatic Drainage (MLD)  short neck superficial and deep abdominals anterior interaxillary and left axillo inguinal anastamosis and left shoulder chest and back  extra time spent on axilla and tightness in abdomen     Kinesiotex  Edema      Kinesiotix   Edema  skinkote and kinsesiotap in fan shape along left lateral trunk and axilla                 PT Short Term Goals - 11/05/17 1749      PT SHORT TERM GOAL #1   Title  Pt will report that she has had 25% improvement in pain in left chest and axilla so that she can perform her kitchen tasks easier.     Status  Achieved      PT SHORT TERM GOAL #2   Title  Pt will report she is doing her arm range of motion exercises 5 times a week     Baseline  exercise varies by how pt feels     Time  8    Period  Weeks    Status  On-going      Short Term Clinic Goals - 08/28/17 1118      CC Short Term Goal  #1   Title  Pt will report she knows strategies for good body mechanics to decrease low  back pain     Status  Achieved      CC Short Term Goal  #2   Title  Pt will have a deacrease in circumference 15 cm prox to ulnar styloid of left forearm by 1 cm  Baseline  25 cm , on 05/16/2017 it is 23.5    Status  Achieved      CC Short Term Goal  #3   Title  Pt will report she knows how to manage left UE lymphedema with use of compression, elevation and exercise, self manual lymph drainage and possibly Flexitouch     Baseline  Pt has day and nighttime garments and Flexitouch     Status  Achieved       PT Long Term Goals - 11/05/17 1748      PT LONG TERM GOAL #1   Title  Pt wants to improve her balance so that she feels more confident with her gait and not be fearful of tripping     Time  8    Period  Weeks    Status  Achieved      PT LONG TERM GOAL #2   Title  Pt will have 150 degrees of shoulder flexion in supine indicating so that she will have greater ease in reaching activites in her daily life     Baseline  varies as pt has decreased ROM when she has increased pain and swelling     Time  8    Period  Weeks    Status  On-going      PT LONG TERM GOAL #3   Title  Pt will report that she feels well enough that she can participate in social activities at least once a week     Time  [facility] Goals - 08/28/17 1119      [facility] Term Goal  #1   Title  Patient will report  that she can manage back pain  so she can perform daily activities with greater ease    Status  Achieved      CC Long Term Goal  #2   Title  Pt will  have given the TENS unit a trial to see if it will help with back pain     Baseline  has not needed to use TENS     Status  Deferred      CC Long Term Goal  #3   Title  Pt will be independent in core and  LE strengthening exercises       CC Long Term Goal  #4   Title  Pt will report low back pain is decreased to a 4/10 so that she can perform activities of daily living easier     Baseline  It  depends on the day , but pt needs to take breaks and knows how to manage her pain, she feels this goal is achieved     Status  Achieved         Plan - 11/19/17 1537    Clinical Impression Statement  Alison [name] had exacerbation of left axilla and arm pain, but states she felt better after treatment     Clinical Impairments Affecting Rehab Potential  mulitple areas of diffuse bony metastasis, chemotherapy with neuropathy , radiation to back  and chest,    PT Next Visit Plan  Focus on myofascial work to left chest and manual lymph draiange to left axilla contine with kinesiotape prn; Try TENS unit to see if it makes a difference in pain when pt is active. contiue with exrcise for aerobics and LE strenth  Patient will benefit from skilled therapeutic intervention in order to improve the following deficits and impairments:     Visit Diagnosis: Abnormal posture  Localized swelling, mass and lump, neck  Chronic left shoulder pain  Stiffness of left shoulder joint  Low back pain with sciatica, sciatica laterality unspecified, unspecified back pain laterality, unspecified chronicity  Disorder of the skin and subcutaneous tissue related to radiation, unspecified     Problem List Patient Active Problem List   Diagnosis Date Noted  . Pain from bone metastases ([place]) 09/11/2017  . Counseling regarding goals of care 04/16/2017  . Bone metastases ([place]) 02/28/2017  . Uncontrolled pain 02/28/2017  . Osteopenia determined by x-ray 09/20/2016  . Primary malignant neoplasm of left breast with stage 2 nodal metastasis per American Joint Committee on Cancer 7th edition (N2) ([place]) 02/14/2016  . Chemotherapy-induced neuropathy ([place]) 12/30/2015  . Insomnia 09/01/2015  . Malignant neoplasm of upper-outer quadrant of left breast in female, estrogen receptor positive ([place]) 08/08/2015   Donato Heinz. Owens Shark PT  Norwood Levo 11/19/2017, 3:40 PM  [place], [place], [address] Phone: [phone]   Fax:  [phone]  Name: Alison [name] MRN: [id number] Date of Birth: [birth year]

## 2017-11-20 MED FILL — VERZENIO 100 MG TAB: 100 | 28 days supply | Qty: 28 | Fill #2

## 2017-11-21 ENCOUNTER — Other Ambulatory Visit: Payer: Self-pay

## 2017-11-21 ENCOUNTER — Encounter: Payer: 59 | Admitting: Physical Therapy

## 2017-11-21 ENCOUNTER — Telehealth: Payer: Self-pay

## 2017-11-21 DIAGNOSIS — Z170 Estrogen receptor positive status [ER+]: Principal | ICD-10-CM

## 2017-11-21 DIAGNOSIS — C50412 Malignant neoplasm of upper-outer quadrant of left female breast: Secondary | ICD-10-CM

## 2017-11-21 MED ORDER — OXYCODONE HCL ER 80 MG PO T12A: 160.0000 mg | EXTENDED_RELEASE_TABLET | Freq: Three times a day (TID) | ORAL | 0 refills | Status: DC

## 2017-11-21 MED ORDER — OXYCODONE HCL 20 MG PO TABS: 20.0000 mg | ORAL_TABLET | ORAL | 0 refills | Status: DC | PRN

## 2017-11-21 NOTE — Telephone Encounter (Signed)
Called pt to inform her scripts would be available for pick up today prior to 4:30pm.  Cyndia Bent RN

## 2017-11-26 ENCOUNTER — Encounter: Payer: Self-pay | Admitting: Physical Therapy

## 2017-11-26 ENCOUNTER — Ambulatory Visit: Payer: 59 | Attending: General Surgery | Admitting: Physical Therapy

## 2017-11-26 DIAGNOSIS — R293 Abnormal posture: Secondary | ICD-10-CM | POA: Diagnosis not present

## 2017-11-26 DIAGNOSIS — M5440 Lumbago with sciatica, unspecified side: Secondary | ICD-10-CM | POA: Insufficient documentation

## 2017-11-26 DIAGNOSIS — L599 Disorder of the skin and subcutaneous tissue related to radiation, unspecified: Secondary | ICD-10-CM | POA: Diagnosis present

## 2017-11-26 DIAGNOSIS — M25612 Stiffness of left shoulder, not elsewhere classified: Secondary | ICD-10-CM | POA: Diagnosis present

## 2017-11-26 DIAGNOSIS — G8929 Other chronic pain: Secondary | ICD-10-CM | POA: Insufficient documentation

## 2017-11-26 DIAGNOSIS — R221 Localized swelling, mass and lump, neck: Secondary | ICD-10-CM | POA: Diagnosis present

## 2017-11-26 DIAGNOSIS — M25512 Pain in left shoulder: Secondary | ICD-10-CM | POA: Diagnosis present
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
## 2017-11-28 ENCOUNTER — Encounter (HOSPITAL_COMMUNITY)
Admission: RE | Admit: 2017-11-28 | Discharge: 2017-11-28 | Disposition: A | Discharge status: Home / Self Care | Payer: 59 | Source: Ambulatory Visit | Attending: Oncology | Admitting: Oncology

## 2017-11-28 DIAGNOSIS — C779 Secondary and unspecified malignant neoplasm of lymph node, unspecified: Secondary | ICD-10-CM | POA: Diagnosis not present

## 2017-11-28 DIAGNOSIS — C7951 Secondary malignant neoplasm of bone: Secondary | ICD-10-CM | POA: Diagnosis not present

## 2017-11-28 DIAGNOSIS — G893 Neoplasm related pain (acute) (chronic): Secondary | ICD-10-CM | POA: Diagnosis not present

## 2017-11-28 DIAGNOSIS — G620 Drug-induced polyneuropathy: Secondary | ICD-10-CM | POA: Insufficient documentation

## 2017-11-28 DIAGNOSIS — C50412 Malignant neoplasm of upper-outer quadrant of left female breast: Secondary | ICD-10-CM | POA: Diagnosis present

## 2017-11-28 DIAGNOSIS — T451X5A Adverse effect of antineoplastic and immunosuppressive drugs, initial encounter: Secondary | ICD-10-CM | POA: Insufficient documentation

## 2017-11-28 DIAGNOSIS — Z170 Estrogen receptor positive status [ER+]: Secondary | ICD-10-CM | POA: Diagnosis present

## 2017-11-28 DIAGNOSIS — C50912 Malignant neoplasm of unspecified site of left female breast: Secondary | ICD-10-CM | POA: Diagnosis not present

## 2017-11-28 MED ORDER — TECHNETIUM TC 99M MEDRONATE IV KIT
25.0000 | PACK | Freq: Once | INTRAVENOUS | Status: AC | PRN
  Administered 2017-11-28: 20 via INTRAVENOUS

## 2017-11-29 ENCOUNTER — Telehealth: Payer: Self-pay | Admitting: *Deleted

## 2017-11-29 DIAGNOSIS — Z170 Estrogen receptor positive status [ER+]: Principal | ICD-10-CM

## 2017-11-29 DIAGNOSIS — M546 Pain in thoracic spine: Secondary | ICD-10-CM

## 2017-11-29 DIAGNOSIS — G893 Neoplasm related pain (acute) (chronic): Secondary | ICD-10-CM

## 2017-11-29 DIAGNOSIS — C7951 Secondary malignant neoplasm of bone: Secondary | ICD-10-CM

## 2017-11-29 DIAGNOSIS — C50412 Malignant neoplasm of upper-outer quadrant of left female breast: Secondary | ICD-10-CM
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
## 2017-12-03 ENCOUNTER — Ambulatory Visit: Payer: 59 | Admitting: Physical Therapy

## 2017-12-03 ENCOUNTER — Other Ambulatory Visit: Payer: Self-pay | Admitting: Oncology

## 2017-12-03 ENCOUNTER — Encounter: Payer: Self-pay | Admitting: Physical Therapy

## 2017-12-03 DIAGNOSIS — L599 Disorder of the skin and subcutaneous tissue related to radiation, unspecified: Secondary | ICD-10-CM

## 2017-12-03 DIAGNOSIS — M5440 Lumbago with sciatica, unspecified side: Secondary | ICD-10-CM

## 2017-12-03 DIAGNOSIS — R221 Localized swelling, mass and lump, neck: Secondary | ICD-10-CM

## 2017-12-03 DIAGNOSIS — C50412 Malignant neoplasm of upper-outer quadrant of left female breast: Secondary | ICD-10-CM

## 2017-12-03 DIAGNOSIS — R293 Abnormal posture: Secondary | ICD-10-CM | POA: Diagnosis not present

## 2017-12-03 DIAGNOSIS — G8929 Other chronic pain: Secondary | ICD-10-CM

## 2017-12-03 DIAGNOSIS — M546 Pain in thoracic spine: Secondary | ICD-10-CM

## 2017-12-03 DIAGNOSIS — G893 Neoplasm related pain (acute) (chronic): Secondary | ICD-10-CM

## 2017-12-03 DIAGNOSIS — Z170 Estrogen receptor positive status [ER+]: Principal | ICD-10-CM

## 2017-12-03 DIAGNOSIS — C7951 Secondary malignant neoplasm of bone: Secondary | ICD-10-CM

## 2017-12-03 DIAGNOSIS — M25612 Stiffness of left shoulder, not elsewhere classified: Secondary | ICD-10-CM

## 2017-12-03 DIAGNOSIS — M25512 Pain in left shoulder: Secondary | ICD-10-CM

## 2017-12-03 NOTE — Therapy (Signed)
[place], [place], [address] Phone: [phone]   Fax:  [phone]  Physical Therapy Treatment  Patient Details  Name: Alison [name] MRN: [id number] Date of Birth: [birth year] Referring Provider: Gardenia Phlegm, NP   Encounter Date: 12/03/2017  PT End of Session - 12/03/17 1806    Visit Number  14    Number of Visits  34    Date for PT Re-Evaluation  01/03/18    PT Start Time  1430    PT Stop Time  1515    PT Time Calculation (min)  45 min    Activity Tolerance  Patient tolerated treatment well    Behavior During Therapy  [facility] for tasks assessed/performed       Past Medical History:  Diagnosis Date  . Anxiety   . Asthma     seasonal asthma  . Breast cancer ([place])    metastatic breast cancer  . Complication of anesthesia 2007   aspiration pneumonia post hysterectomy.  Blood pressure would drop low- but no problems 01/2016- surgery  . History of blood transfusion   . History of mitral valve prolapse 1988   with palpitations  . History of radiation therapy 04/02/16- 05/21/16   Left Chest Wall, SCV, Axilla, IM nodes  . Hypertension    no meds  . Neuropathy    with chemo  . Osteopenia determined by x-ray   . Pneumonia    1980's and 1990's  . Raynaud's disease   . 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 Skin cancer 03/12/2014   basal- back    Past Surgical History:  Procedure Laterality Date  . ABDOMINAL HYSTERECTOMY  2007   with BSO  . APPENDECTOMY  2007  . AXILLARY LYMPH NODE DISSECTION Left 02/14/2016   Procedure: LEFT AXILLARY LYMPH NODE DISSECTION;  Surgeon: [name], MD;  Location: [place];  Service: General;  Laterality: Left;  . BREAST RECONSTRUCTION WITH PLACEMENT OF TISSUE EXPANDER AND FLEX HD (ACELLULAR HYDRATED DERMIS) Left 02/02/2016   Procedure: IMMEDIATE LEFT BREAST RECONSTRUCTION WITH PLACEMENT OF TISSUE EXPANDER AND FLEX HD  (ACELLULAR HYDRATED DERMIS);  Surgeon: [name], DO;  Location: [place];  Service: Plastics;  Laterality: Left;  . BREAST REDUCTION SURGERY Right 03/28/2017   Procedure: RIGHT MAMMARY REDUCTION  (BREAST);  Surgeon: [name], DO;  Location: [place];  Service: Plastics;  Laterality: Right;  . BREAST SURGERY Left 02/02/16   mastectomy with reconstruction  . c-section  1991  . COLON RESECTION SIGMOID  12/02/2012   [facility]   . HERNIA REPAIR  2007  . LAPAROSCOPIC ENDOMETRIOSIS FULGURATION  1984, 1989  . MASTOPEXY Right 03/28/2017   Procedure: MASTOPEXY;  Surgeon: [name], DO;  Location: [place];  Service: Plastics;  Laterality: Right;  . Port- a- cath placement  07/2015  . PORT-A-CATH REMOVAL Right 02/02/2016   Procedure: REMOVAL PORT-A-CATH;  Surgeon: [name], MD;  Location: [place];  Service: General;  Laterality: Right;  . RADIOACTIVE SEED GUIDED AXILLARY SENTINEL LYMPH NODE Left 02/02/2016   Procedure: LEFT BREAST MASTECTOMY AND RADIOACTIVE SEED GUIDED LEFT SENTINEL LYMPH NODE BIOPSY ;  Surgeon: [name], MD;  Location: [place];  Service: General;  Laterality: Left;  . REMOVAL OF TISSUE EXPANDER AND PLACEMENT OF IMPLANT Left 03/28/2017   Procedure: REMOVAL OF TISSUE EXPANDER AND PLACEMENT OF IMPLANT;  Surgeon: [name],  DO;  Location: [place];  Service: Plastics;  Laterality: Left;    There were no vitals filed for this visit.  Subjective Assessment - 12/03/17 1805    Subjective  Pt continues to have increased pain in her back that is requiring her to make more narcotic pain medicine     Pertinent History  Breast cancer diagnosed November 2016 She had chemotherapy. but did have some neuropathy so it had to be stopped. On June 8, she had a mastectomy with immedicate expander placement and with first set of lymphnodes removed, On June 20 she went  for complete axillary lymph node dissection. She says she has had always been "full necked" but has neck and upper body fullness since the cancer was diagnosed. She reports she has osteopenia in low back and has had a fracture that was discovered in 2014 when she had a Dexa scan. 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  Yes                       [facility] Adult PT Treatment/Exercise - 12/03/17 0001      Manual Therapy   Manual Lymphatic Drainage (MLD)  short neck superficial and deep abdominals anterior interaxillary and left axillo inguinal anastamosis and left shoulder chest and back  extra time spent on axilla and tightness in abdomen     Kinesiotex  Edema      Kinesiotix   Edema  skinkote and kinsesiotap in fan shape along left lateral trunk and axilla                 PT Short Term Goals - 11/05/17 1749      PT SHORT TERM GOAL #1   Title  Pt will report that she has had 25% improvement in pain in left chest and axilla so that she can perform her kitchen tasks easier.     Status  Achieved      PT SHORT TERM GOAL #2   Title  Pt will report she is doing her arm range of motion exercises 5 times a week     Baseline  exercise varies by how pt feels     Time  8    Period  Weeks    Status  On-going      Short Term Clinic Goals - 08/28/17 1118      CC Short Term Goal  #1   Title  Pt will report she knows strategies for good body mechanics to decrease low back pain     Status  Achieved      CC Short Term Goal  #2   Title  Pt will have a deacrease in circumference 15 cm prox to ulnar styloid of left forearm by 1 cm     Baseline  25 cm , on 05/16/2017 it is 23.5    Status   Achieved      CC Short Term Goal  #3   Title  Pt will report she knows how to manage left UE lymphedema with use of compression, elevation and exercise, self manual lymph drainage and possibly Flexitouch  Baseline  Pt has day and nighttime garments and Flexitouch     Status  Achieved       PT Long Term Goals - 11/26/17 0948      PT LONG TERM GOAL #1   Title  Pt wants to improve her balance so that she feels more confident with her gait and not be fearful of tripping     Status  Achieved      PT LONG TERM GOAL #2   Title  Pt will have 150 degrees of shoulder flexion in supine indicating so that she will have greater ease in reaching activites in her daily life     Baseline  varies as pt has decreased ROM when she has increased pain and swelling     Status  On-going      PT LONG TERM GOAL #3   Title  Pt will report that she feels well enough that she can participate in social activities at least once a week     Status  Achieved      PT LONG TERM GOAL #4   Title  Pt will report her back pain is maintained at a level of 2/10 so that she can maintain her quality of life     Time  8    Period  [facility] Goals - 08/28/17 1119      CC Long Term Goal  #1   Title  Patient will report  that she can manage back pain  so she can perform daily activities with greater ease    Status  Achieved      CC Long Term Goal  #2   Title  Pt will  have given the TENS unit a trial to see if it will help with back pain     Baseline  has not needed to use TENS     Status  Deferred      CC Long Term Goal  #3   Title  Pt will be independent in core and  LE strengthening exercises       CC Long Term Goal  #4   Title  Pt will report low back pain is decreased to a 4/10 so that she can perform activities of daily living easier     Baseline  It depends on the day , but pt needs to take breaks and knows how to manage her pain, she feels this goal is achieved      Status  Achieved         Plan - 12/03/17 1807    Clinical Impression Statement  Inbasket sent to Dr. [name] informing about pt increase in pain and that she hasn't heard about follow up testing. She receives some relief from manual therapy for MLD and kinesiotape, but still is requiring more pain meds     PT Treatment/Interventions  ADLs/Self Care Home Management;Manual lymph drainage;Compression bandaging;Scar mobilization;Passive range of motion;Patient/family education;Taping;Manual techniques;Therapeutic exercise;Therapeutic activities;Orthotic Fit/Training;DME Instruction;Electrical Stimulation;Moist Heat;Gait training;Stair training;Functional mobility training;Balance training;Neuromuscular re-education;Vestibular    PT Next Visit Plan  Focus on  soft tissue and myofascial work to left chest and manual lymph draiange to left axilla contine with kinesiotape prn; Try TENS unit to see if it makes a difference in pain when pt is active. contiue with exrcise for aerobics and LE strenth        Patient will benefit from skilled therapeutic intervention  in order to improve the following deficits and impairments:  Decreased range of motion, Impaired UE functional use, Decreased activity tolerance, Decreased knowledge of precautions, Decreased skin integrity, Impaired perceived functional ability, Pain, Decreased knowledge of use of DME, Increased edema, Postural dysfunction, Decreased strength, Impaired sensation, Decreased scar mobility, Decreased mobility, Increased muscle spasms, Dizziness, Increased fascial restricitons, Decreased balance, Difficulty walking  Visit Diagnosis: Abnormal posture  Chronic left shoulder pain  Localized swelling, mass and lump, neck  Stiffness of left shoulder joint  Low back pain with sciatica, sciatica laterality unspecified, unspecified back pain laterality, unspecified chronicity  Disorder of the skin and subcutaneous tissue related to radiation,  unspecified     Problem List Patient Active Problem List   Diagnosis Date Noted  . Pain from bone metastases ([place]) 09/11/2017  . Counseling regarding goals of care 04/16/2017  . Bone metastases ([place]) 02/28/2017  . Uncontrolled pain 02/28/2017  . Osteopenia determined by x-ray 09/20/2016  . Primary malignant neoplasm of left breast with stage 2 nodal metastasis per American Joint Committee on Cancer 7th edition (N2) ([place]) 02/14/2016  . Chemotherapy-induced neuropathy ([place]) 12/30/2015  . Insomnia 09/01/2015  . Malignant neoplasm of upper-outer quadrant of left breast in female, estrogen receptor positive ([place]) 08/08/2015   Donato Heinz. Owens Shark PT  Norwood Levo 12/03/2017, 6:10 PM  [place], [place], [address] Phone: [phone]   Fax:  [phone]  Name: KAMYIA THOMASON MRN: [id number] Date of Birth: July 17, 1961

## 2017-12-05 ENCOUNTER — Ambulatory Visit: Payer: 59 | Admitting: Physical Therapy

## 2017-12-05 DIAGNOSIS — M25512 Pain in left shoulder: Secondary | ICD-10-CM

## 2017-12-05 DIAGNOSIS — R221 Localized swelling, mass and lump, neck: Secondary | ICD-10-CM

## 2017-12-05 DIAGNOSIS — M25612 Stiffness of left shoulder, not elsewhere classified: Secondary | ICD-10-CM

## 2017-12-05 DIAGNOSIS — R293 Abnormal posture: Secondary | ICD-10-CM

## 2017-12-05 DIAGNOSIS — G8929 Other chronic pain: Secondary | ICD-10-CM

## 2017-12-05 DIAGNOSIS — L599 Disorder of the skin and subcutaneous tissue related to radiation, unspecified: Secondary | ICD-10-CM

## 2017-12-05 DIAGNOSIS — M5440 Lumbago with sciatica, unspecified side: Secondary | ICD-10-CM

## 2017-12-05 NOTE — Therapy (Signed)
[name], [place], [address] Phone: [phone]   Fax:  [phone]  Physical Therapy Treatment  Patient Details  Name: Alison [name] MRN: [id number] Date of Birth: [birth year] Referring Provider: Gardenia Phlegm, NP   Encounter Date: 12/05/2017  PT End of Session - 12/05/17 1242    Visit Number  15    Number of Visits  34    Date for PT Re-Evaluation  01/03/18    PT Start Time  0263    PT Stop Time  1100    PT Time Calculation (min)  45 min    Activity Tolerance  Patient tolerated treatment well    Behavior During Therapy  [facility] for tasks assessed/performed       Past Medical History:  Diagnosis Date  . Anxiety   . Asthma     seasonal asthma  . Breast cancer ([place])    metastatic breast cancer  . Complication of anesthesia 2007   aspiration pneumonia post hysterectomy.  Blood pressure would drop low- but no problems 01/2016- surgery  . History of blood transfusion   . History of mitral valve prolapse 1988   with palpitations  . History of radiation therapy 04/02/16- 05/21/16   Left Chest Wall, SCV, Axilla, IM nodes  . Hypertension    no meds  . Neuropathy    with chemo  . Osteopenia determined by x-ray   . Pneumonia    1980's and 1990's  . Raynaud's disease   . 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 Skin cancer 03/12/2014   basal- back    Past Surgical History:  Procedure Laterality Date  . ABDOMINAL HYSTERECTOMY  2007   with BSO  . APPENDECTOMY  2007  . AXILLARY LYMPH NODE DISSECTION Left 02/14/2016   Procedure: LEFT AXILLARY LYMPH NODE DISSECTION;  Surgeon: [name], MD;  Location: [place];  Service: General;  Laterality: Left;  . BREAST RECONSTRUCTION WITH PLACEMENT OF TISSUE EXPANDER AND FLEX HD (ACELLULAR HYDRATED DERMIS) Left 02/02/2016   Procedure: IMMEDIATE LEFT BREAST RECONSTRUCTION WITH PLACEMENT OF TISSUE EXPANDER AND FLEX HD  (ACELLULAR HYDRATED DERMIS);  Surgeon: [name], DO;  Location: [place];  Service: Plastics;  Laterality: Left;  . BREAST REDUCTION SURGERY Right 03/28/2017   Procedure: RIGHT MAMMARY REDUCTION  (BREAST);  Surgeon: [name], DO;  Location: [place];  Service: Plastics;  Laterality: Right;  . BREAST SURGERY Left 02/02/16   mastectomy with reconstruction  . c-section  1991  . COLON RESECTION SIGMOID  12/02/2012   [facility]   . HERNIA REPAIR  2007  . LAPAROSCOPIC ENDOMETRIOSIS FULGURATION  1984, 1989  . MASTOPEXY Right 03/28/2017   Procedure: MASTOPEXY;  Surgeon: [name], DO;  Location: [place];  Service: Plastics;  Laterality: Right;  . Port- a- cath placement  07/2015  . PORT-A-CATH REMOVAL Right 02/02/2016   Procedure: REMOVAL PORT-A-CATH;  Surgeon: [name], MD;  Location: [place];  Service: General;  Laterality: Right;  . RADIOACTIVE SEED GUIDED AXILLARY SENTINEL LYMPH NODE Left 02/02/2016   Procedure: LEFT BREAST MASTECTOMY AND RADIOACTIVE SEED GUIDED LEFT SENTINEL LYMPH NODE BIOPSY ;  Surgeon: [name], MD;  Location: [place];  Service: General;  Laterality: Left;  . REMOVAL OF TISSUE EXPANDER AND PLACEMENT OF IMPLANT Left 03/28/2017   Procedure: REMOVAL OF TISSUE EXPANDER AND PLACEMENT OF IMPLANT;  Surgeon: [name],  DO;  Location: [place];  Service: Plastics;  Laterality: Left;    There were no vitals filed for this visit.  Subjective Assessment - 12/05/17 1020    Subjective  Pt says she has to take 3 oxycodones to help this back  pain so she can function. She will be having her MRI on Saturday     Patient is accompained by:  Family member    Pertinent History  Breast cancer diagnosed November 2016 She had chemotherapy. but did have some neuropathy so it had to be stopped. On June 8, she had a mastectomy with immedicate expander placement  and with first set of lymphnodes removed, On June 20 she went for complete axillary lymph node dissection. She says she has had always been "full necked" but has neck and upper body fullness since the cancer was diagnosed. She reports she has osteopenia in low back and has had a fracture that was discovered in 2014 when she had a Dexa scan. 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  Yes    Pain Score  4     Pain Location  Back    Pain Orientation  Mid    Pain Descriptors / Indicators  Sharp;Dull;Aching    Pain Type  Acute pain    Pain Radiating Towards  no radiation or pain in her legs     Pain Onset  1 to 4 weeks ago    Pain Frequency  Constant    Aggravating Factors   bending over     Pain Relieving Factors  needs more pain med                        [facility] Adult PT Treatment/Exercise - 12/05/17 0001      Electrical Stimulation   Electrical Stimulation Location  low back     Electrical Stimulation Action  TENS    Electrical Stimulation Parameters  instructed in and set parameters for pt to control pain independently. Videotaped instructions so that pt/family will be able apply and remove electodes and adjust parameters to control pain as needed     Electrical Stimulation Goals  Pain so that pt does not have to take as much medicine       Manual Therapy   Manual Lymphatic Drainage (MLD)  short neck superficial and deep abdominals anterior interaxillary and left axillo inguinal anastamosis and left shoulder chest and back  extra time spent on axilla and tightness in abdomen                PT Short Term Goals - 12/05/17 1243      PT SHORT TERM GOAL #1   Title  Pt will report that  she has had 25% improvement in pain in left chest and axilla so that she can perform her kitchen tasks easier.     Status  Achieved      PT SHORT TERM GOAL #2   Title  Pt will report she is doing her arm range of motion exercises 5 times a week     Baseline  exercise varies by how pt feels  Status  Achieved      Short Term Clinic Goals - 08/28/17 1118      CC Short Term Goal  #1   Title  Pt will report she knows strategies for good body mechanics to decrease low back pain     Status  Achieved      CC Short Term Goal  #2   Title  Pt will have a deacrease in circumference 15 cm prox to ulnar styloid of left forearm by 1 cm     Baseline  25 cm , on 05/16/2017 it is 23.5    Status  Achieved      CC Short Term Goal  #3   Title  Pt will report she knows how to manage left UE lymphedema with use of compression, elevation and exercise, self manual lymph drainage and possibly Flexitouch     Baseline  Pt has day and nighttime garments and Flexitouch     Status  Achieved       PT Long Term Goals - 12/05/17 1243      PT LONG TERM GOAL #1   Title  Pt wants to improve her balance so that she feels more confident with her gait and not be fearful of tripping     Status  Achieved      PT LONG TERM GOAL #2   Title  Pt will have 150 degrees of shoulder flexion in supine indicating so that she will have greater ease in reaching activites in her daily life     Baseline  varies as pt has decreased ROM when she has increased pain and swelling     Time  8    Period  Weeks    Status  On-going      PT LONG TERM GOAL #3   Title  Pt will report that she feels well enough that she can participate in social activities at least once a week     Period  Weeks    Status  On-going      PT LONG TERM GOAL #4   Title  Pt will report her back pain is maintained at a level of 2/10 so that she can maintain her quality of life     Status  On-going        Long Term Clinic Goals - 08/28/17 1119      CC  Long Term Goal  #1   Title  Patient will report  that she can manage back pain  so she can perform daily activities with greater ease    Status  Achieved      CC Long Term Goal  #2   Title  Pt will  have given the TENS unit a trial to see if it will help with back pain     Baseline  has not needed to use TENS     Status  Deferred      CC Long Term Goal  #3   Title  Pt will be independent in core and  LE strengthening exercises       CC Long Term Goal  #4   Title  Pt will report low back pain is decreased to a 4/10 so that she can perform activities of daily living easier     Baseline  It depends on the day , but pt needs to take breaks and knows how to manage her pain, she feels this goal is achieved     Status  Achieved  Patient will benefit from skilled therapeutic intervention in order to improve the following deficits and impairments:     Visit Diagnosis: Abnormal posture  Chronic left shoulder pain  Localized swelling, mass and lump, neck  Stiffness of left shoulder joint  Low back pain with sciatica, sciatica laterality unspecified, unspecified back pain laterality, unspecified chronicity  Disorder of the skin and subcutaneous tissue related to radiation, unspecified     Problem List Patient Active Problem List   Diagnosis Date Noted  . Pain from bone metastases ([place]) 09/11/2017  . Counseling regarding goals of care 04/16/2017  . Bone metastases ([place]) 02/28/2017  . Uncontrolled pain 02/28/2017  . Osteopenia determined by x-ray 09/20/2016  . Primary malignant neoplasm of left breast with stage 2 nodal metastasis per American Joint Committee on Cancer 7th edition (N2) ([place]) 02/14/2016  . Chemotherapy-induced neuropathy ([place]) 12/30/2015  . Insomnia 09/01/2015  . Malignant neoplasm of upper-outer quadrant of left breast in female, estrogen receptor positive ([place]) 08/08/2015   Donato Heinz. Owens Shark PT  Norwood Levo 12/05/2017, 12:45 PM  [place], [place], [address] Phone: [phone]   Fax:  [phone]  Name: SUSI GOSLIN MRN: [id number] Date of Birth: Jul 18, 1961

## 2017-12-07 ENCOUNTER — Ambulatory Visit (HOSPITAL_COMMUNITY)
Admission: RE | Admit: 2017-12-07 | Discharge: 2017-12-07 | Disposition: A | Discharge status: Home / Self Care | Payer: 59 | Source: Ambulatory Visit | Attending: Oncology | Admitting: Oncology

## 2017-12-07 DIAGNOSIS — M8448XA Pathological fracture, other site, initial encounter for fracture: Secondary | ICD-10-CM | POA: Insufficient documentation

## 2017-12-07 DIAGNOSIS — C50412 Malignant neoplasm of upper-outer quadrant of left female breast: Secondary | ICD-10-CM | POA: Diagnosis not present

## 2017-12-07 DIAGNOSIS — G893 Neoplasm related pain (acute) (chronic): Secondary | ICD-10-CM | POA: Diagnosis not present

## 2017-12-07 DIAGNOSIS — C7951 Secondary malignant neoplasm of bone: Secondary | ICD-10-CM | POA: Diagnosis not present

## 2017-12-07 DIAGNOSIS — Z170 Estrogen receptor positive status [ER+]: Secondary | ICD-10-CM | POA: Diagnosis not present

## 2017-12-07 MED ORDER — GADOBENATE DIMEGLUMINE 529 MG/ML IV SOLN
15.0000 mL | Freq: Once | INTRAVENOUS | Status: AC | PRN
  Administered 2017-12-07: 15 mL via INTRAVENOUS

## 2017-12-08 ENCOUNTER — Other Ambulatory Visit: Payer: Self-pay | Admitting: Oncology

## 2017-12-08 NOTE — Progress Notes (Signed)
I called Alison [name] and let her know the MRI does not show acute fractures.  There are chronic fractures.  There is early tumor involvement of the spine, without involvement of the cord.  She has been referred to [facility] and I have encouraged her to seek his help in terms of pain control

## 2017-12-10 ENCOUNTER — Encounter: Payer: 59 | Admitting: Physical Therapy

## 2017-12-11 ENCOUNTER — Encounter: Payer: Self-pay | Admitting: Physical Therapy

## 2017-12-11 NOTE — Therapy (Signed)
[facility] [address] [place], [place], [address] Phone: [phone]   Fax:  [phone]  Patient Details  Name: SCOUT GUYETT MRN: [id number] Date of Birth: [birth year] Referring Provider:  No ref. provider found  Encounter Date: 12/11/2017  PHYSICAL THERAPY DISCHARGE SUMMARY  Visits from Start of Care: 1 (vestibular therapy)  Current functional level related to goals / functional outcomes: Unable to assess pt progress with vestibular goals as pt was unable to return to vestibular therapy due to multiple medical issues limiting pt's ability to safely participate in vestibular therapy.  Will D/C at this time but pt may return if dizziness persists and physician clears pt to return to vestibular therapy.  PT Short Term Goals - 12/11/17 1045      PT SHORT TERM GOAL #3   Title  Vestibular goal: pt will participate in further assessment of gait with FGA and gait velocity    Time  4    Period  Weeks    Status  Unable to assess      PT SHORT TERM GOAL #4   Title  Vestibular goal: pt will participate in motion sensitivity and SOT testing with habituation HEP to be initiated    Time  4    Period  Weeks    Status  Unable to assess      PT Long Term Goals - 12/11/17 1044      PT LONG TERM GOAL #3   Title  VESTIBULAR GOALS: Pt will demonstrate independence with vestibular and balance HEP    Time  8    Period  Weeks    Status  Unable to assess      PT LONG TERM GOAL #4   Title  Pt will report >/= 10 point improvement in overall function (as reported on FOTO) by D/C    Baseline  73% (27% Limited)    Time  8    Period  Weeks    Status  Unable to assess      PT LONG TERM GOAL #5   Title  Pt will report decrease in symptom severity to </= 1/5 on MSQ    Time  8    Period  Weeks    Status  Unable to assess      PT LONG TERM GOAL #6   Title  Pt will improve FGA by 8 points to decrease falls risk    Time  8    Period   Weeks    Status  Unable to assess      PT LONG TERM GOAL #7   Title  Pt will improve gait velocity to >3.6 ft/sec     Time  8    Period  Weeks    Status  Unable to assess      PT LONG TERM GOAL #8   Title  Pt will improve SOT composite score by 10 points    Time  8    Period  Weeks    Status  Unable to assess         Remaining deficits: Dizziness, impaired balance, increased falls risk   Education / Equipment: Unable to provide HEP   Plan: Patient agrees to discharge.  Patient goals were not met. Patient is being discharged due to not returning since the last visit.  ?????     Rico Junker, PT, DPT 12/11/17    10:45 AM    [place]  [address] [place], [place], [address] Phone: [phone]   Fax:  [phone]

## 2017-12-16 ENCOUNTER — Other Ambulatory Visit: Payer: Self-pay | Admitting: *Deleted

## 2017-12-16 DIAGNOSIS — Z170 Estrogen receptor positive status [ER+]: Principal | ICD-10-CM

## 2017-12-16 DIAGNOSIS — C50412 Malignant neoplasm of upper-outer quadrant of left female breast: Secondary | ICD-10-CM

## 2017-12-16 MED ORDER — OXYCODONE HCL ER 80 MG PO T12A: 160.0000 mg | EXTENDED_RELEASE_TABLET | Freq: Three times a day (TID) | ORAL | 0 refills | Status: DC

## 2017-12-16 MED ORDER — OXYCODONE HCL 20 MG PO TABS: 20.0000 mg | ORAL_TABLET | ORAL | 0 refills | Status: DC | PRN
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
## 2017-12-19 ENCOUNTER — Inpatient Hospital Stay: Payer: 59 | Attending: Oncology

## 2017-12-19 ENCOUNTER — Inpatient Hospital Stay (HOSPITAL_BASED_OUTPATIENT_CLINIC_OR_DEPARTMENT_OTHER): Payer: 59 | Admitting: Oncology

## 2017-12-19 ENCOUNTER — Inpatient Hospital Stay: Payer: 59

## 2017-12-19 ENCOUNTER — Other Ambulatory Visit: Payer: Self-pay

## 2017-12-19 ENCOUNTER — Telehealth: Payer: Self-pay | Admitting: Oncology

## 2017-12-19 ENCOUNTER — Telehealth: Payer: Self-pay

## 2017-12-19 VITALS — BP 114/74 | HR 109 | Temp 98.3°F | Resp 18 | Ht 62.0 in | Wt 157.8 lb

## 2017-12-19 DIAGNOSIS — R52 Pain, unspecified: Secondary | ICD-10-CM

## 2017-12-19 DIAGNOSIS — T451X5A Adverse effect of antineoplastic and immunosuppressive drugs, initial encounter: Secondary | ICD-10-CM

## 2017-12-19 DIAGNOSIS — G620 Drug-induced polyneuropathy: Secondary | ICD-10-CM

## 2017-12-19 DIAGNOSIS — Z79899 Other long term (current) drug therapy: Secondary | ICD-10-CM | POA: Insufficient documentation

## 2017-12-19 DIAGNOSIS — R530 Neoplastic (malignant) related fatigue: Secondary | ICD-10-CM

## 2017-12-19 DIAGNOSIS — C50412 Malignant neoplasm of upper-outer quadrant of left female breast: Secondary | ICD-10-CM | POA: Insufficient documentation

## 2017-12-19 DIAGNOSIS — C779 Secondary and unspecified malignant neoplasm of lymph node, unspecified: Secondary | ICD-10-CM

## 2017-12-19 DIAGNOSIS — R110 Nausea: Secondary | ICD-10-CM

## 2017-12-19 DIAGNOSIS — G8929 Other chronic pain: Secondary | ICD-10-CM

## 2017-12-19 DIAGNOSIS — C50912 Malignant neoplasm of unspecified site of left female breast: Secondary | ICD-10-CM

## 2017-12-19 DIAGNOSIS — G893 Neoplasm related pain (acute) (chronic): Secondary | ICD-10-CM

## 2017-12-19 DIAGNOSIS — Z87891 Personal history of nicotine dependence: Secondary | ICD-10-CM | POA: Insufficient documentation

## 2017-12-19 DIAGNOSIS — C7951 Secondary malignant neoplasm of bone: Secondary | ICD-10-CM | POA: Diagnosis not present

## 2017-12-19 DIAGNOSIS — Z170 Estrogen receptor positive status [ER+]: Principal | ICD-10-CM

## 2017-12-19 DIAGNOSIS — Z5111 Encounter for antineoplastic chemotherapy: Secondary | ICD-10-CM | POA: Insufficient documentation

## 2017-12-19 LAB — CBC WITH DIFFERENTIAL/PLATELET
BASOS ABS: 0 10*3/uL (ref 0.0–0.1)
Basophils Relative: 1 %
EOS PCT: 4 %
Eosinophils Absolute: 0.1 10*3/uL (ref 0.0–0.5)
HCT: 33.4 % — ABNORMAL LOW (ref 34.8–46.6)
HEMOGLOBIN: 11.2 g/dL — AB (ref 11.6–15.9)
LYMPHS PCT: 21 %
Lymphs Abs: 0.6 10*3/uL — ABNORMAL LOW (ref 0.9–3.3)
MCH: 34.4 pg — ABNORMAL HIGH (ref 25.1–34.0)
MCHC: 33.5 g/dL (ref 31.5–36.0)
MCV: 102.5 fL — AB (ref 79.5–101.0)
Monocytes Absolute: 0.4 10*3/uL (ref 0.1–0.9)
Monocytes Relative: 14 %
NEUTROS PCT: 60 %
Neutro Abs: 1.7 10*3/uL (ref 1.5–6.5)
PLATELETS: 133 10*3/uL — AB (ref 145–400)
RBC: 3.26 MIL/uL — AB (ref 3.70–5.45)
RDW: 14.4 % (ref 11.2–14.5)
WBC: 2.9 10*3/uL — ABNORMAL LOW (ref 3.9–10.3)

## 2017-12-19 LAB — COMPREHENSIVE METABOLIC PANEL
ALT: 22 U/L (ref 0–55)
AST: 32 U/L (ref 5–34)
Albumin: 3.4 g/dL — ABNORMAL LOW (ref 3.5–5.0)
Alkaline Phosphatase: 103 U/L (ref 40–150)
Anion gap: 10 (ref 3–11)
BUN: 8 mg/dL (ref 7–26)
CHLORIDE: 106 mmol/L (ref 98–109)
CO2: 24 mmol/L (ref 22–29)
CREATININE: 0.72 mg/dL (ref 0.60–1.10)
Calcium: 8.8 mg/dL (ref 8.4–10.4)
GFR calc non Af Amer: >60 mL/min (ref >60)
Glucose, Bld: 133 mg/dL (ref 70–140)
POTASSIUM: 4.1 mmol/L (ref 3.5–5.1)
SODIUM: 140 mmol/L (ref 136–145)
Total Bilirubin: 0.3 mg/dL (ref 0.2–1.2)
Total Protein: 6.6 g/dL (ref 6.4–8.3)

## 2017-12-19 MED ORDER — FULVESTRANT 250 MG/5ML IM SOLN
INTRAMUSCULAR | Status: AC
  Filled 2017-12-19: qty 5

## 2017-12-19 MED ORDER — DENOSUMAB 120 MG/1.7ML ~~LOC~~ SOLN
SUBCUTANEOUS | Status: AC
  Filled 2017-12-19: qty 1.7

## 2017-12-19 MED ORDER — FULVESTRANT 250 MG/5ML IM SOLN
500.0000 mg | Freq: Once | INTRAMUSCULAR | Status: AC
  Administered 2017-12-19: 500 mg via INTRAMUSCULAR

## 2017-12-19 MED ORDER — DENOSUMAB 120 MG/1.7ML ~~LOC~~ SOLN
120.0000 mg | Freq: Once | SUBCUTANEOUS | Status: AC
  Administered 2017-12-19: 120 mg via SUBCUTANEOUS

## 2017-12-19 MED FILL — VERZENIO 100 MG TAB: 100 | 28 days supply | Qty: 28 | Fill #3

## 2017-12-19 NOTE — Progress Notes (Signed)
Received order per Dr [name] for referral order to Dr Maryjean Ka for patient and if possible, appt there in 2 weeks.  I faxed the referral to Argentina Donovan at [facility], Dr Maryjean Ka with most recent notes and demographic sheet.

## 2017-12-19 NOTE — Telephone Encounter (Signed)
Faxed referral earlier to Dr Maryjean Ka Argentina Donovan.  Left Raquel Sarna (adm assistant for Dr Maryjean Ka) VM at [facility] regarding pain referral with our contact info and preference for pt to receive an appt in next 2 wks if possible.

## 2017-12-19 NOTE — Telephone Encounter (Signed)
Gave avs and calendar ° °

## 2017-12-24 ENCOUNTER — Telehealth: Payer: Self-pay

## 2017-12-24 ENCOUNTER — Ambulatory Visit: Payer: 59

## 2017-12-24 NOTE — Telephone Encounter (Signed)
Alison [name] with Dr Daisey Must neuro called and said they have made pt an appt for May 7th after hours and will spend an hour speaking with her regarding her pain control, they have contacted pt.  If we have any additional questions, we can contact their office.

## 2017-12-30 ENCOUNTER — Other Ambulatory Visit: Payer: Self-pay | Admitting: *Deleted

## 2017-12-31 ENCOUNTER — Other Ambulatory Visit: Payer: Self-pay

## 2017-12-31 DIAGNOSIS — C50412 Malignant neoplasm of upper-outer quadrant of left female breast: Secondary | ICD-10-CM

## 2017-12-31 DIAGNOSIS — Z170 Estrogen receptor positive status [ER+]: Principal | ICD-10-CM

## 2017-12-31 MED ORDER — OXYCODONE HCL 20 MG PO TABS: 20.0000 mg | ORAL_TABLET | ORAL | 0 refills | Status: DC | PRN

## 2017-12-31 NOTE — Telephone Encounter (Signed)
Returned pt's call regarding her request for refill of Oxycodone 20 mg and let her know that prescription has been completed by Dr [name] and is ready at cancer center for pickup.  Pt voiced understanding and no other needs at this time per pt.

## 2018-01-10 ENCOUNTER — Other Ambulatory Visit: Payer: Self-pay | Admitting: Oncology

## 2018-01-10 DIAGNOSIS — Z170 Estrogen receptor positive status [ER+]: Principal | ICD-10-CM

## 2018-01-10 DIAGNOSIS — C50412 Malignant neoplasm of upper-outer quadrant of left female breast: Secondary | ICD-10-CM

## 2018-01-13 ENCOUNTER — Encounter: Payer: Self-pay | Admitting: Physical Therapy

## 2018-01-13 ENCOUNTER — Ambulatory Visit: Payer: 59 | Attending: General Surgery | Admitting: Physical Therapy

## 2018-01-13 DIAGNOSIS — M5440 Lumbago with sciatica, unspecified side: Secondary | ICD-10-CM | POA: Diagnosis present

## 2018-01-13 DIAGNOSIS — M25512 Pain in left shoulder: Secondary | ICD-10-CM | POA: Insufficient documentation

## 2018-01-13 DIAGNOSIS — R293 Abnormal posture: Secondary | ICD-10-CM | POA: Insufficient documentation

## 2018-01-13 DIAGNOSIS — M25612 Stiffness of left shoulder, not elsewhere classified: Secondary | ICD-10-CM | POA: Insufficient documentation

## 2018-01-13 DIAGNOSIS — G8929 Other chronic pain: Secondary | ICD-10-CM

## 2018-01-13 DIAGNOSIS — R221 Localized swelling, mass and lump, neck: Secondary | ICD-10-CM | POA: Diagnosis present

## 2018-01-13 DIAGNOSIS — L599 Disorder of the skin and subcutaneous tissue related to radiation, unspecified: Secondary | ICD-10-CM | POA: Diagnosis present

## 2018-01-13 DIAGNOSIS — I890 Lymphedema, not elsewhere classified: Secondary | ICD-10-CM | POA: Diagnosis present

## 2018-01-13 NOTE — Therapy (Signed)
[place], [place], [address] Phone: [phone]   Fax:  [phone]  Physical Therapy Treatment  Patient Details  Name: Alison [name] MRN: [id number] Date of Birth: 1960-12-19 Referring Provider: Dr. [name]    Encounter Date: 01/13/2018  PT End of Session - 01/13/18 1720    Visit Number  16    Number of Visits  42    Date for PT Re-Evaluation  03/15/18    PT Start Time  3810    PT Stop Time  1600    PT Time Calculation (min)  45 min    Activity Tolerance  Patient tolerated treatment well    Behavior During Therapy  [facility] for tasks assessed/performed       Past Medical History:  Diagnosis Date  . Anxiety   . Asthma     seasonal asthma  . Breast cancer ([place])    metastatic breast cancer  . Complication of anesthesia 2007   aspiration pneumonia post hysterectomy.  Blood pressure would drop low- but no problems 01/2016- surgery  . History of blood transfusion   . History of mitral valve prolapse 1988   with palpitations  . History of radiation therapy 04/02/16- 05/21/16   Left Chest Wall, SCV, Axilla, IM nodes  . Hypertension    no meds  . Neuropathy    with chemo  . Osteopenia determined by x-ray   . Pneumonia    1980's and 1990's  . Raynaud's disease   . 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 Skin cancer 03/12/2014   basal- back    Past Surgical History:  Procedure Laterality Date  . ABDOMINAL HYSTERECTOMY  2007   with BSO  . APPENDECTOMY  2007  . AXILLARY LYMPH NODE DISSECTION Left 02/14/2016   Procedure: LEFT AXILLARY LYMPH NODE DISSECTION;  Surgeon: [name], MD;  Location: [facility];  Service: General;  Laterality: Left;  . BREAST RECONSTRUCTION WITH PLACEMENT OF TISSUE EXPANDER AND FLEX HD (ACELLULAR HYDRATED DERMIS) Left 02/02/2016   Procedure: IMMEDIATE LEFT BREAST RECONSTRUCTION WITH PLACEMENT OF TISSUE EXPANDER AND FLEX HD (ACELLULAR HYDRATED  DERMIS);  Surgeon: [name], DO;  Location: [facility];  Service: Plastics;  Laterality: Left;  . BREAST REDUCTION SURGERY Right 03/28/2017   Procedure: RIGHT MAMMARY REDUCTION  (BREAST);  Surgeon: [name], DO;  Location: [facility];  Service: Plastics;  Laterality: Right;  . BREAST SURGERY Left 02/02/16   mastectomy with reconstruction  . c-section  1991  . COLON RESECTION SIGMOID  12/02/2012   [facility]   . HERNIA REPAIR  2007  . LAPAROSCOPIC ENDOMETRIOSIS FULGURATION  1984, 1989  . MASTOPEXY Right 03/28/2017   Procedure: MASTOPEXY;  Surgeon: [name], DO;  Location: [facility];  Service: Plastics;  Laterality: Right;  . Port- a- cath placement  07/2015  . PORT-A-CATH REMOVAL Right 02/02/2016   Procedure: REMOVAL PORT-A-CATH;  Surgeon: [name], MD;  Location: [facility];  Service: General;  Laterality: Right;  . RADIOACTIVE SEED GUIDED AXILLARY SENTINEL LYMPH NODE Left 02/02/2016   Procedure: LEFT BREAST MASTECTOMY AND RADIOACTIVE SEED GUIDED LEFT SENTINEL LYMPH NODE BIOPSY ;  Surgeon: [name], MD;  Location: [facility];  Service: General;  Laterality: Left;  . REMOVAL OF TISSUE EXPANDER AND PLACEMENT OF IMPLANT Left 03/28/2017   Procedure: REMOVAL OF TISSUE EXPANDER AND PLACEMENT OF IMPLANT;  Surgeon: [name], DO;  Location: [facility];  Service: Plastics;  Laterality: Left;    There were no vitals filed for this visit.  Subjective Assessment - 01/13/18 1520    Subjective  Pt states that her right foot and leg have been swelling up. She does not have a blood clot but is not sure why her leg is swelling.  She went to see Dr. Maryjean Ka about her back pain and she started a new medication, but she is not able to tolerate it.     Pertinent History  Breast cancer diagnosed November 2016 She had chemotherapy. but did have some neuropathy so it had to be stopped. On  June 8, she had a mastectomy with immedicate expander placement and with first set of lymphnodes removed, On June 20 she went for complete axillary lymph node dissection. She says she has had always been "full necked" but has neck and upper body fullness since the cancer was diagnosed. She reports she has osteopenia in low back and has had a fracture that was discovered in 2014 when she had a Dexa scan. Pt has had metastasis to the bones of the spine and recent  bilateral breast surgery for a reduction on the right and expander removal on the left with implant placement and removal     Patient Stated Goals   to help manage pain and swelling in axilla ,prevent falls and continue with current quality of life          [facility] PT Assessment - 01/13/18 0001      Assessment   Medical Diagnosis  breast cancer     Referring Provider  Dr. [name]     Onset Date/Surgical Date  07/13/15      Prior Function   Level of Independence  Independent        LYMPHEDEMA/ONCOLOGY QUESTIONNAIRE - 01/13/18 1723      Type   Cancer Type  left breast       What other symptoms do you have   Are you Having Heaviness or Tightness  Yes    Are you having Pain  Yes    Are you having pitting edema  Yes    Body Site  left forearm     Other Symptoms  pt also with swellin in both feet right> left                 [facility] Adult PT Treatment/Exercise - 01/13/18 0001      Manual Therapy   Edema Management  pt had both feet elevated on bolster during this treatment and she had some reduction in right foot edema.  Suggested that pt wear her velcro compression garment on her arm with compression glove on hand to try to reduce the pitting edema in her lower arm     Myofascial Release  to scar on left chest     Manual Lymphatic Drainage (MLD)  short neck superficial and deep abdominals anterior interaxillary and left axillo inguinal anastamosis and left shoulder chest and back  extra time spent on axilla and tightness  in abdomen                PT Short Term Goals - 12/11/17 1045      PT SHORT TERM GOAL #3   Title  Vestibular goal: pt will participate in further assessment of gait with FGA and gait velocity    Time  4    Period  Weeks    Status  Unable to assess  PT SHORT TERM GOAL #4   Title  Vestibular goal: pt will participate in motion sensitivity and SOT testing with habituation HEP to be initiated    Time  4    Period  Weeks    Status  Unable to assess      Short Term Clinic Goals - 08/28/17 1118      CC Short Term Goal  #1   Title  Pt will report she knows strategies for good body mechanics to decrease low back pain     Status  Achieved      CC Short Term Goal  #2   Title  Pt will have a deacrease in circumference 15 cm prox to ulnar styloid of left forearm by 1 cm     Baseline  25 cm , on 05/16/2017 it is 23.5    Status  Achieved      CC Short Term Goal  #3   Title  Pt will report she knows how to manage left UE lymphedema with use of compression, elevation and exercise, self manual lymph drainage and possibly Flexitouch     Baseline  Pt has day and nighttime garments and Flexitouch     Status  Achieved       PT Long Term Goals - 01/13/18 1732      PT LONG TERM GOAL #1   Title  Pt will have decrease in left forearm circumference at 10 cm proximal to ulnar styolid to 20 cm     Baseline  22 cm on 01/03/2018     Period  Weeks    Status  New      PT LONG TERM GOAL #2   Title  Pt will have 150 degrees of shoulder flexion in supine indicating so that she will have greater ease in reaching activites in her daily life     Baseline  varies as pt has decreased ROM when she has increased pain and swelling     Time  8    Period  Weeks    Status  On-going      PT LONG TERM GOAL #3   Title  Pt will report she is participating in general exercise program of her choosing  3 times per week     Time  8    Period  Weeks    Status  New        Long Term Clinic Goals -  08/28/17 1119      CC Long Term Goal  #1   Title  Patient will report  that she can manage back pain  so she can perform daily activities with greater ease    Status  Achieved      CC Long Term Goal  #2   Title  Pt will  have given the TENS unit a trial to see if it will help with back pain     Baseline  has not needed to use TENS     Status  Deferred      CC Long Term Goal  #3   Title  Pt will be independent in core and  LE strengthening exercises       CC Long Term Goal  #4   Title  Pt will report low back pain is decreased to a 4/10 so that she can perform activities of daily living easier     Baseline  It depends on the day , but pt needs to take breaks and knows how to  manage her pain, she feels this goal is achieved     Status  Achieved         Plan - 01/13/18 1725    Clinical Impression Statement  Alison [name] returns to PT after having not been here for over a month.  She has had an exacerbation of pitting edema in her left arm.  She comes in wearing her daytime sleeve today but still has increased circumference measurements in left arm and hand.  She also reports fullness in her abdomen and has visible swelling in her feet , especially right foot. She wonders if she is having these increased symptoms since she had not been coming to PT. Renewal sent for more visits to get her symptoms back under control     Clinical Impairments Affecting Rehab Potential  mulitple areas of diffuse bony metastasis, chemotherapy with neuropathy , radiation to back  and chest,    PT Frequency  2x / week will start at 2x/week, then decrease     PT Duration  8 weeks    PT Treatment/Interventions  ADLs/Self Care Home Management;Manual lymph drainage;Compression bandaging;Scar mobilization;Passive range of motion;Patient/family education;Taping;Manual techniques;Therapeutic exercise;Therapeutic activities;Orthotic Fit/Training;DME Instruction;Electrical Stimulation;Moist Heat;Gait training;Stair  training;Functional mobility training;Balance training;Neuromuscular re-education;Vestibular    PT Next Visit Plan   Manual lymph drainage to left UE and back with myofascial release work to left chest     Consulted and Agree with Plan of Care  Patient       Patient will benefit from skilled therapeutic intervention in order to improve the following deficits and impairments:  Decreased range of motion, Impaired UE functional use, Decreased activity tolerance, Decreased knowledge of precautions, Decreased skin integrity, Impaired perceived functional ability, Pain, Decreased knowledge of use of DME, Increased edema, Postural dysfunction, Decreased strength, Impaired sensation, Decreased scar mobility, Decreased mobility, Increased muscle spasms, Dizziness, Increased fascial restricitons, Decreased balance, Difficulty walking  Visit Diagnosis: Abnormal posture  Chronic left shoulder pain  Localized swelling, mass and lump, neck  Stiffness of left shoulder joint  Low back pain with sciatica, sciatica laterality unspecified, unspecified back pain laterality, unspecified chronicity  Disorder of the skin and subcutaneous tissue related to radiation, unspecified  Lymphedema, not elsewhere classified     Problem List Patient Active Problem List   Diagnosis Date Noted  . Pain from bone metastases ([place]) 09/11/2017  . Counseling regarding goals of care 04/16/2017  . Bone metastases ([place]) 02/28/2017  . Uncontrolled pain 02/28/2017  . Osteopenia determined by x-ray 09/20/2016  . Primary malignant neoplasm of left breast with stage 2 nodal metastasis per American Joint Committee on Cancer 7th edition (N2) ([place]) 02/14/2016  . Chemotherapy-induced neuropathy ([place]) 12/30/2015  . Insomnia 09/01/2015  . Malignant neoplasm of upper-outer quadrant of left breast in female, estrogen receptor positive ([place]) 08/08/2015   Alison [name] PT  Alison [name] 01/13/2018, 5:36 PM  [place], [place], [address] Phone: [phone]   Fax:  [phone]  Name: Alison [name] MRN: [id number] Date of Birth: Jan 15, 1961

## 2018-01-15 ENCOUNTER — Inpatient Hospital Stay: Payer: 59

## 2018-01-15 ENCOUNTER — Telehealth: Payer: Self-pay | Admitting: *Deleted

## 2018-01-15 ENCOUNTER — Inpatient Hospital Stay: Payer: 59 | Attending: Oncology

## 2018-01-15 DIAGNOSIS — Z170 Estrogen receptor positive status [ER+]: Secondary | ICD-10-CM

## 2018-01-15 DIAGNOSIS — Z5111 Encounter for antineoplastic chemotherapy: Secondary | ICD-10-CM | POA: Insufficient documentation

## 2018-01-15 DIAGNOSIS — C50412 Malignant neoplasm of upper-outer quadrant of left female breast: Secondary | ICD-10-CM

## 2018-01-15 DIAGNOSIS — C7951 Secondary malignant neoplasm of bone: Secondary | ICD-10-CM

## 2018-01-15 DIAGNOSIS — M8580 Other specified disorders of bone density and structure, unspecified site: Secondary | ICD-10-CM

## 2018-01-15 DIAGNOSIS — C779 Secondary and unspecified malignant neoplasm of lymph node, unspecified: Secondary | ICD-10-CM

## 2018-01-15 DIAGNOSIS — C50912 Malignant neoplasm of unspecified site of left female breast: Secondary | ICD-10-CM

## 2018-01-15 LAB — CBC WITH DIFFERENTIAL/PLATELET
BASOS ABS: 0 10*3/uL (ref 0.0–0.1)
BASOS PCT: 1 %
Eosinophils Absolute: 0.2 10*3/uL (ref 0.0–0.5)
Eosinophils Relative: 6 %
HEMATOCRIT: 34.4 % — AB (ref 34.8–46.6)
Hemoglobin: 11.7 g/dL (ref 11.6–15.9)
Lymphocytes Relative: 26 %
Lymphs Abs: 0.9 10*3/uL (ref 0.9–3.3)
MCH: 34.1 pg — ABNORMAL HIGH (ref 25.1–34.0)
MCHC: 34 g/dL (ref 31.5–36.0)
MCV: 100.4 fL (ref 79.5–101.0)
MONO ABS: 0.5 10*3/uL (ref 0.1–0.9)
Monocytes Relative: 14 %
NEUTROS ABS: 1.9 10*3/uL (ref 1.5–6.5)
Neutrophils Relative %: 53 %
Platelets: 182 10*3/uL (ref 145–400)
RBC: 3.43 MIL/uL — ABNORMAL LOW (ref 3.70–5.45)
RDW: 14.6 % — AB (ref 11.2–14.5)
WBC: 3.6 10*3/uL — ABNORMAL LOW (ref 3.9–10.3)

## 2018-01-15 LAB — CMP (CANCER CENTER ONLY)
ALK PHOS: 113 U/L (ref 40–150)
ALT: 26 U/L (ref 0–55)
ANION GAP: 10 (ref 3–11)
AST: 35 U/L — ABNORMAL HIGH (ref 5–34)
Albumin: 3.7 g/dL (ref 3.5–5.0)
BILIRUBIN TOTAL: 0.4 mg/dL (ref 0.2–1.2)
BUN: 10 mg/dL (ref 7–26)
CALCIUM: 8.6 mg/dL (ref 8.4–10.4)
CO2: 26 mmol/L (ref 22–29)
Chloride: 102 mmol/L (ref 98–109)
Creatinine: 0.74 mg/dL (ref 0.60–1.10)
GFR, Est AFR Am: >60 mL/min (ref >60)
GLUCOSE: 107 mg/dL (ref 70–140)
POTASSIUM: 3.6 mmol/L (ref 3.5–5.1)
Sodium: 138 mmol/L (ref 136–145)
TOTAL PROTEIN: 7.2 g/dL (ref 6.4–8.3)

## 2018-01-15 MED ORDER — OXYCODONE HCL 20 MG PO TABS: 20.0000 mg | ORAL_TABLET | ORAL | 0 refills | Status: DC | PRN

## 2018-01-15 MED ORDER — DENOSUMAB 120 MG/1.7ML ~~LOC~~ SOLN
120.0000 mg | Freq: Once | SUBCUTANEOUS | Status: AC
  Administered 2018-01-15: 120 mg via SUBCUTANEOUS

## 2018-01-15 MED ORDER — DENOSUMAB 120 MG/1.7ML ~~LOC~~ SOLN
SUBCUTANEOUS | Status: AC
  Filled 2018-01-15: qty 1.7

## 2018-01-15 MED ORDER — OXYCODONE HCL ER 80 MG PO T12A: 160.0000 mg | EXTENDED_RELEASE_TABLET | Freq: Three times a day (TID) | ORAL | 0 refills | Status: DC

## 2018-01-15 MED ORDER — FULVESTRANT 250 MG/5ML IM SOLN
500.0000 mg | Freq: Once | INTRAMUSCULAR | Status: AC
  Administered 2018-01-15: 500 mg via INTRAMUSCULAR

## 2018-01-15 MED ORDER — FULVESTRANT 250 MG/5ML IM SOLN
INTRAMUSCULAR | Status: AC
  Filled 2018-01-15: qty 10

## 2018-01-15 NOTE — Addendum Note (Signed)
Addended by: Kasandra Knudsen A on: 01/15/2018 03:01 PM   Modules accepted: Orders

## 2018-01-15 NOTE — Patient Instructions (Signed)
Fulvestrant injection What is this medicine? FULVESTRANT (ful VES trant) blocks the effects of estrogen. It is used to treat breast cancer. This medicine may be used for other purposes; ask your health care provider or pharmacist if you have questions. COMMON BRAND NAME(S): FASLODEX What should I tell my health care provider before I take this medicine? They need to know if you have any of these conditions: -bleeding problems -liver disease -low levels of platelets in the blood -an unusual or allergic reaction to fulvestrant, other medicines, foods, dyes, or preservatives -pregnant or trying to get pregnant -breast-feeding How should I use this medicine? This medicine is for injection into a muscle. It is usually given by a health care professional in a hospital or clinic setting. Talk to your pediatrician regarding the use of this medicine in children. Special care may be needed. Overdosage: If you think you have taken too much of this medicine contact a poison control center or emergency room at once. NOTE: This medicine is only for you. Do not share this medicine with others. What if I miss a dose? It is important not to miss your dose. Call your doctor or health care professional if you are unable to keep an appointment. What may interact with this medicine? -medicines that treat or prevent blood clots like warfarin, enoxaparin, and dalteparin This list may not describe all possible interactions. Give your health care provider a list of all the medicines, herbs, non-prescription drugs, or dietary supplements you use. Also tell them if you smoke, drink alcohol, or use illegal drugs. Some items may interact with your medicine. What should I watch for while using this medicine? Your condition will be monitored carefully while you are receiving this medicine. You will need important blood work done while you are taking this medicine. Do not become pregnant while taking this medicine or for  at least 1 year after stopping it. Women of child-bearing potential will need to have a negative pregnancy test before starting this medicine. Women should inform their doctor if they wish to become pregnant or think they might be pregnant. There is a potential for serious side effects to an unborn child. Men should inform their doctors if they wish to father a child. This medicine may lower sperm counts. Talk to your health care professional or pharmacist for more information. Do not breast-feed an infant while taking this medicine or for 1 year after the last dose. What side effects may I notice from receiving this medicine? Side effects that you should report to your doctor or health care professional as soon as possible: -allergic reactions like skin rash, itching or hives, swelling of the face, lips, or tongue -feeling faint or lightheaded, falls -pain, tingling, numbness, or weakness in the legs -signs and symptoms of infection like fever or chills; cough; flu-like symptoms; sore throat -vaginal bleeding Side effects that usually do not require medical attention (report to your doctor or health care professional if they continue or are bothersome): -aches, pains -constipation -diarrhea -headache -hot flashes -nausea, vomiting -pain at site where injected -stomach pain This list may not describe all possible side effects. Call your doctor for medical advice about side effects. You may report side effects to FDA at 1-800-FDA-1088. Where should I keep my medicine? This drug is given in a hospital or clinic and will not be stored at home. NOTE: This sheet is a summary. It may not cover all possible information. If you have questions about this medicine, talk to your   doctor, pharmacist, or health care provider.  2018 Elsevier/Gold Standard (2015-03-11 11:03:55) Denosumab injection What is this medicine? DENOSUMAB (den oh sue mab) slows bone breakdown. Prolia is used to treat osteoporosis in  women after menopause and in men. Delton See is used to treat a high calcium level due to cancer and to prevent bone fractures and other bone problems caused by multiple myeloma or cancer bone metastases. Delton See is also used to treat giant cell tumor of the bone. This medicine may be used for other purposes; ask your health care provider or pharmacist if you have questions. COMMON BRAND NAME(S): Prolia, XGEVA What should I tell my health care provider before I take this medicine? They need to know if you have any of these conditions: -dental disease -having surgery or tooth extraction -infection -kidney disease -low levels of calcium or Vitamin D in the blood -malnutrition -on hemodialysis -skin conditions or sensitivity -thyroid or parathyroid disease -an unusual reaction to denosumab, other medicines, foods, dyes, or preservatives -pregnant or trying to get pregnant -breast-feeding How should I use this medicine? This medicine is for injection under the skin. It is given by a health care professional in a hospital or clinic setting. If you are getting Prolia, a special MedGuide will be given to you by the pharmacist with each prescription and refill. Be sure to read this information carefully each time. For Prolia, talk to your pediatrician regarding the use of this medicine in children. Special care may be needed. For Delton See, talk to your pediatrician regarding the use of this medicine in children. While this drug may be prescribed for children as young as 13 years for selected conditions, precautions do apply. Overdosage: If you think you have taken too much of this medicine contact a poison control center or emergency room at once. NOTE: This medicine is only for you. Do not share this medicine with others. What if I miss a dose? It is important not to miss your dose. Call your doctor or health care professional if you are unable to keep an appointment. What may interact with this  medicine? Do not take this medicine with any of the following medications: -other medicines containing denosumab This medicine may also interact with the following medications: -medicines that lower your chance of fighting infection -steroid medicines like prednisone or cortisone This list may not describe all possible interactions. Give your health care provider a list of all the medicines, herbs, non-prescription drugs, or dietary supplements you use. Also tell them if you smoke, drink alcohol, or use illegal drugs. Some items may interact with your medicine. What should I watch for while using this medicine? Visit your doctor or health care professional for regular checks on your progress. Your doctor or health care professional may order blood tests and other tests to see how you are doing. Call your doctor or health care professional for advice if you get a fever, chills or sore throat, or other symptoms of a cold or flu. Do not treat yourself. This drug may decrease your body's ability to fight infection. Try to avoid being around people who are sick. You should make sure you get enough calcium and vitamin D while you are taking this medicine, unless your doctor tells you not to. Discuss the foods you eat and the vitamins you take with your health care professional. See your dentist regularly. Brush and floss your teeth as directed. Before you have any dental work done, tell your dentist you are receiving this medicine. Do  not become pregnant while taking this medicine or for 5 months after stopping it. Talk with your doctor or health care professional about your birth control options while taking this medicine. Women should inform their doctor if they wish to become pregnant or think they might be pregnant. There is a potential for serious side effects to an unborn child. Talk to your health care professional or pharmacist for more information. What side effects may I notice from receiving this  medicine? Side effects that you should report to your doctor or health care professional as soon as possible: -allergic reactions like skin rash, itching or hives, swelling of the face, lips, or tongue -bone pain -breathing problems -dizziness -jaw pain, especially after dental work -redness, blistering, peeling of the skin -signs and symptoms of infection like fever or chills; cough; sore throat; pain or trouble passing urine -signs of low calcium like fast heartbeat, muscle cramps or muscle pain; pain, tingling, numbness in the hands or feet; seizures -unusual bleeding or bruising -unusually weak or tired Side effects that usually do not require medical attention (report to your doctor or health care professional if they continue or are bothersome): -constipation -diarrhea -headache -joint pain -loss of appetite -muscle pain -runny nose -tiredness -upset stomach This list may not describe all possible side effects. Call your doctor for medical advice about side effects. You may report side effects to FDA at 1-800-FDA-1088. Where should I keep my medicine? This medicine is only given in a clinic, doctor's office, or other health care setting and will not be stored at home. NOTE: This sheet is a summary. It may not cover all possible information. If you have questions about this medicine, talk to your doctor, pharmacist, or health care provider.  2018 Elsevier/Gold Standard (2016-09-04 19:17:21)  

## 2018-01-16 ENCOUNTER — Ambulatory Visit: Payer: 59 | Admitting: Rehabilitation

## 2018-01-16 ENCOUNTER — Encounter: Payer: Self-pay | Admitting: Rehabilitation

## 2018-01-16 DIAGNOSIS — I890 Lymphedema, not elsewhere classified: Secondary | ICD-10-CM

## 2018-01-16 DIAGNOSIS — R293 Abnormal posture: Secondary | ICD-10-CM | POA: Diagnosis not present

## 2018-01-16 DIAGNOSIS — L599 Disorder of the skin and subcutaneous tissue related to radiation, unspecified: Secondary | ICD-10-CM

## 2018-01-16 NOTE — Therapy (Signed)
[place], [place], [address] Phone: [phone]   Fax:  [phone]  Physical Therapy Treatment  Patient Details  Name: Alison [name] MRN: [id number] Date of Birth: 04-Jun-1961 Referring Provider: Dr. [name]    Encounter Date: 01/16/2018  PT End of Session - 01/16/18 1657    Visit Number  17    Number of Visits  42    Date for PT Re-Evaluation  03/15/18    Authorization Type  [employer]    PT Start Time  1605    PT Stop Time  1645    PT Time Calculation (min)  40 min    Activity Tolerance  Patient tolerated treatment well    Behavior During Therapy  [facility] for tasks assessed/performed       Past Medical History:  Diagnosis Date  . Anxiety   . Asthma     seasonal asthma  . Breast cancer ([place])    metastatic breast cancer  . Complication of anesthesia 2007   aspiration pneumonia post hysterectomy.  Blood pressure would drop low- but no problems 01/2016- surgery  . History of blood transfusion   . History of mitral valve prolapse 1988   with palpitations  . History of radiation therapy 04/02/16- 05/21/16   Left Chest Wall, SCV, Axilla, IM nodes  . Hypertension    no meds  . Neuropathy    with chemo  . Osteopenia determined by x-ray   . Pneumonia    1980's and 1990's  . Raynaud's disease   . 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 Skin cancer 03/12/2014   basal- back    Past Surgical History:  Procedure Laterality Date  . ABDOMINAL HYSTERECTOMY  2007   with BSO  . APPENDECTOMY  2007  . AXILLARY LYMPH NODE DISSECTION Left 02/14/2016   Procedure: LEFT AXILLARY LYMPH NODE DISSECTION;  Surgeon: [name], MD;  Location: [facility];  Service: General;  Laterality: Left;  . BREAST RECONSTRUCTION WITH PLACEMENT OF TISSUE EXPANDER AND FLEX HD (ACELLULAR HYDRATED DERMIS) Left 02/02/2016   Procedure: IMMEDIATE LEFT BREAST RECONSTRUCTION WITH PLACEMENT OF TISSUE EXPANDER AND  FLEX HD (ACELLULAR HYDRATED DERMIS);  Surgeon: [name], DO;  Location: [facility];  Service: Plastics;  Laterality: Left;  . BREAST REDUCTION SURGERY Right 03/28/2017   Procedure: RIGHT MAMMARY REDUCTION  (BREAST);  Surgeon: [name], DO;  Location: [facility];  Service: Plastics;  Laterality: Right;  . BREAST SURGERY Left 02/02/16   mastectomy with reconstruction  . c-section  1991  . COLON RESECTION SIGMOID  12/02/2012   [facility]   . HERNIA REPAIR  2007  . LAPAROSCOPIC ENDOMETRIOSIS FULGURATION  1984, 1989  . MASTOPEXY Right 03/28/2017   Procedure: MASTOPEXY;  Surgeon: [name], DO;  Location: [facility];  Service: Plastics;  Laterality: Right;  . Port- a- cath placement  07/2015  . PORT-A-CATH REMOVAL Right 02/02/2016   Procedure: REMOVAL PORT-A-CATH;  Surgeon: [name], MD;  Location: [facility];  Service: General;  Laterality: Right;  . RADIOACTIVE SEED GUIDED AXILLARY SENTINEL LYMPH NODE Left 02/02/2016   Procedure: LEFT BREAST MASTECTOMY AND RADIOACTIVE SEED GUIDED LEFT SENTINEL LYMPH NODE BIOPSY ;  Surgeon: [name], MD;  Location: [facility];  Service: General;  Laterality: Left;  . REMOVAL OF TISSUE EXPANDER AND PLACEMENT OF IMPLANT Left 03/28/2017   Procedure: REMOVAL OF TISSUE EXPANDER AND PLACEMENT OF  IMPLANT;  Surgeon: [name], DO;  Location: [facility];  Service: Plastics;  Laterality: Left;    There were no vitals filed for this visit.  Subjective Assessment - 01/16/18 1603    Subjective  The foot swelling is better today.  The velcrogarment helped the forearm swelling but not the upper arm.  The upper arm is the worst today.      Pertinent History  Breast cancer diagnosed November 2016 She had chemotherapy. but did have some neuropathy so it had to be stopped. On June 8, she had a mastectomy with immedicate expander placement and with first set  of lymphnodes removed, On June 20 she went for complete axillary lymph node dissection. She says she has had always been "full necked" but has neck and upper body fullness since the cancer was diagnosed. She reports she has osteopenia in low back and has had a fracture that was discovered in 2014 when she had a Dexa scan. 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  Yes    Pain Score  6     Pain Location  Back    Pain Orientation  Mid    Pain Descriptors / Indicators  Sharp    Pain Type  Acute pain    Aggravating Factors   bending over    Pain Relieving Factors  pain medication                       [facility] Adult PT Treatment/Exercise - 01/16/18 0001      Manual Therapy   Myofascial Release  to scar on left chest     Manual Lymphatic Drainage (MLD)  short neck superficial and deep abdominals anterior interaxillary and left axillo inguinal anastamosis and left shoulder chest and back  extra time spent on scapular region and axilla               PT Short Term Goals - 12/11/17 1045      PT SHORT TERM GOAL #3   Title  Vestibular goal: pt will participate in further assessment of gait with FGA and gait velocity    Time  4    Period  Weeks    Status  Unable to assess      PT SHORT TERM GOAL #4   Title  Vestibular goal: pt will participate in motion sensitivity and SOT testing with habituation HEP to be initiated    Time  4    Period  Weeks    Status  Unable to assess      Short Term Clinic Goals - 08/28/17 1118      CC Short Term Goal  #1   Title  Pt will report she knows strategies for good body mechanics to decrease low back pain     Status  Achieved      CC Short Term Goal   #2   Title  Pt will have a deacrease in circumference 15 cm prox to ulnar styloid of left forearm by 1 cm     Baseline  25 cm , on 05/16/2017 it is 23.5    Status  Achieved      CC Short Term Goal  #3   Title  Pt will report she knows how to manage left UE lymphedema with use of compression, elevation and exercise, self manual lymph drainage and possibly Flexitouch     Baseline  Pt has day and nighttime garments and Flexitouch     Status  Achieved       PT Long Term Goals - 01/13/18 1732      PT LONG TERM GOAL #1   Title  Pt will have decrease in left forearm circumference at 10 cm proximal to ulnar styolid to 20 cm     Baseline  22 cm on 01/03/2018     Period  Weeks    Status  New      PT LONG TERM GOAL #2   Title  Pt will have 150 degrees of shoulder flexion in supine indicating so that she will have greater ease in reaching activites in her daily life     Baseline  varies as pt has decreased ROM when she has increased pain and swelling     Time  8    Period  Weeks    Status  On-going      PT LONG TERM GOAL #3   Title  Pt will report she is participating in general exercise program of her choosing  3 times per week     Time  8    Period  Weeks    Status  New        Long Term Clinic Goals - 08/28/17 1119      CC Long Term Goal  #1   Title  Patient will report  that she can manage back pain  so she can perform daily activities with greater ease    Status  Achieved      CC Long Term Goal  #2   Title  Pt will  have given the TENS unit a trial to see if it will help with back pain     Baseline  has not needed to use TENS     Status  Deferred      CC Long Term Goal  #3   Title  Pt will be independent in core and  LE strengthening exercises       CC Long Term Goal  #4   Title  Pt will report low back pain is decreased to a 4/10 so that she can perform activities of daily living easier     Baseline  It depends on the day , but pt needs to take breaks and knows how to  manage her pain, she feels this goal is achieved     Status  Achieved         Plan - 01/16/18 1658    Clinical Impression Statement  Reports that the velcro garment helped with the increased Lt UE swelling in the forearm but not the upper arm.  The feet are better she reports for no known reason.  Tolerated MLD well with continued chest wall tightness and truncal edema    Clinical Impairments Affecting Rehab Potential  mulitple areas of diffuse bony metastasis, chemotherapy with neuropathy , radiation to back  and chest,    PT Frequency  2x / week    PT Duration  8 weeks    PT Treatment/Interventions  ADLs/Self Care Home Management;Manual lymph drainage;Compression bandaging;Scar mobilization;Passive range of motion;Patient/family education;Taping;Manual techniques;Therapeutic exercise;Therapeutic activities;Orthotic Fit/Training;DME Instruction;Electrical Stimulation;Moist Heat;Gait training;Stair training;Functional mobility  training;Balance training;Neuromuscular re-education;Vestibular    PT Next Visit Plan   Manual lymph drainage to left UE and back with myofascial release work to left chest        Patient will benefit from skilled therapeutic intervention in order to improve the following deficits and impairments:  Decreased range of motion, Impaired UE functional use, Decreased activity tolerance, Decreased knowledge of precautions, Decreased skin integrity, Impaired perceived functional ability, Pain, Decreased knowledge of use of DME, Increased edema, Postural dysfunction, Decreased strength, Impaired sensation, Decreased scar mobility, Decreased mobility, Increased muscle spasms, Dizziness, Increased fascial restricitons, Decreased balance, Difficulty walking  Visit Diagnosis: Disorder of the skin and subcutaneous tissue related to radiation, unspecified  Lymphedema, not elsewhere classified     Problem List Patient Active Problem List   Diagnosis Date Noted  . Pain from bone  metastases ([place]) 09/11/2017  . Counseling regarding goals of care 04/16/2017  . Bone metastases ([place]) 02/28/2017  . Uncontrolled pain 02/28/2017  . Osteopenia determined by x-ray 09/20/2016  . Primary malignant neoplasm of left breast with stage 2 nodal metastasis per American Joint Committee on Cancer 7th edition (N2) ([place]) 02/14/2016  . Chemotherapy-induced neuropathy ([place]) 12/30/2015  . Insomnia 09/01/2015  . Malignant neoplasm of upper-outer quadrant of left breast in female, estrogen receptor positive ([place]) 08/08/2015    Shan Levans, PT 01/16/2018, 5:01 PM  [place], [place], [address] Phone: [phone]   Fax:  [phone]  Name: Alison [name] MRN: [id number] Date of Birth: 26-Dec-1960

## 2018-01-17 MED FILL — VERZENIO 100 MG TAB: 100 | 28 days supply | Qty: 28 | Fill #0

## 2018-01-22 ENCOUNTER — Other Ambulatory Visit: Payer: Self-pay | Admitting: Oncology

## 2018-01-23 ENCOUNTER — Encounter: Payer: Self-pay | Admitting: Physical Therapy

## 2018-01-23 ENCOUNTER — Ambulatory Visit: Payer: 59 | Admitting: Physical Therapy

## 2018-01-23 DIAGNOSIS — G8929 Other chronic pain: Secondary | ICD-10-CM

## 2018-01-23 DIAGNOSIS — R293 Abnormal posture: Secondary | ICD-10-CM | POA: Diagnosis not present

## 2018-01-23 DIAGNOSIS — L599 Disorder of the skin and subcutaneous tissue related to radiation, unspecified: Secondary | ICD-10-CM

## 2018-01-23 DIAGNOSIS — I890 Lymphedema, not elsewhere classified: Secondary | ICD-10-CM

## 2018-01-23 DIAGNOSIS — M5440 Lumbago with sciatica, unspecified side: Secondary | ICD-10-CM

## 2018-01-23 DIAGNOSIS — M25512 Pain in left shoulder: Secondary | ICD-10-CM

## 2018-01-23 NOTE — Therapy (Signed)
[place], [place], [address] Phone: [phone]   Fax:  [phone]  Physical Therapy Treatment  Patient Details  Name: Alison [name] MRN: [id number] Date of Birth: 10/09/1960 Referring Provider: Dr. [name]    Encounter Date: 01/23/2018  PT End of Session - 01/23/18 1912    Visit Number  18    Number of Visits  42    Date for PT Re-Evaluation  03/15/18    PT Start Time  0258    PT Stop Time  1433    PT Time Calculation (min)  48 min    Behavior During Therapy  [facility] for tasks assessed/performed       Past Medical History:  Diagnosis Date  . Anxiety   . Asthma     seasonal asthma  . Breast cancer ([place])    metastatic breast cancer  . Complication of anesthesia 2007   aspiration pneumonia post hysterectomy.  Blood pressure would drop low- but no problems 01/2016- surgery  . History of blood transfusion   . History of mitral valve prolapse 1988   with palpitations  . History of radiation therapy 04/02/16- 05/21/16   Left Chest Wall, SCV, Axilla, IM nodes  . Hypertension    no meds  . Neuropathy    with chemo  . Osteopenia determined by x-ray   . Pneumonia    1980's and 1990's  . Raynaud's disease   . 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 Skin cancer 03/12/2014   basal- back    Past Surgical History:  Procedure Laterality Date  . ABDOMINAL HYSTERECTOMY  2007   with BSO  . APPENDECTOMY  2007  . AXILLARY LYMPH NODE DISSECTION Left 02/14/2016   Procedure: LEFT AXILLARY LYMPH NODE DISSECTION;  Surgeon: [name], MD;  Location: [place];  Service: General;  Laterality: Left;  . BREAST RECONSTRUCTION WITH PLACEMENT OF TISSUE EXPANDER AND FLEX HD (ACELLULAR HYDRATED DERMIS) Left 02/02/2016   Procedure: IMMEDIATE LEFT BREAST RECONSTRUCTION WITH PLACEMENT OF TISSUE EXPANDER AND FLEX HD (ACELLULAR HYDRATED DERMIS);  Surgeon: [name], DO;  Location:  [place];  Service: Plastics;  Laterality: Left;  . BREAST REDUCTION SURGERY Right 03/28/2017   Procedure: RIGHT MAMMARY REDUCTION  (BREAST);  Surgeon: [name], DO;  Location: [place];  Service: Plastics;  Laterality: Right;  . BREAST SURGERY Left 02/02/16   mastectomy with reconstruction  . c-section  1991  . COLON RESECTION SIGMOID  12/02/2012   [facility]   . HERNIA REPAIR  2007  . LAPAROSCOPIC ENDOMETRIOSIS FULGURATION  1984, 1989  . MASTOPEXY Right 03/28/2017   Procedure: MASTOPEXY;  Surgeon: [name], DO;  Location: [place];  Service: Plastics;  Laterality: Right;  . Port- a- cath placement  07/2015  . PORT-A-CATH REMOVAL Right 02/02/2016   Procedure: REMOVAL PORT-A-CATH;  Surgeon: [name], MD;  Location: [place];  Service: General;  Laterality: Right;  . RADIOACTIVE SEED GUIDED AXILLARY SENTINEL LYMPH NODE Left 02/02/2016   Procedure: LEFT BREAST MASTECTOMY AND RADIOACTIVE SEED GUIDED LEFT SENTINEL LYMPH NODE BIOPSY ;  Surgeon: [name], MD;  Location: [place];  Service: General;  Laterality: Left;  . REMOVAL OF TISSUE EXPANDER AND PLACEMENT OF IMPLANT Left 03/28/2017   Procedure: REMOVAL OF TISSUE EXPANDER AND PLACEMENT OF IMPLANT;  Surgeon: [name], DO;  Location: [place];  Service: Plastics;  Laterality: Left;    There were no vitals filed for this visit.  Subjective Assessment - 01/23/18 1358    Subjective  Pt states her back has been hurting since [place]     Pertinent History  Breast cancer diagnosed November 2016 She had chemotherapy. but did have some neuropathy so it had to be stopped. On June 8, she had a mastectomy with immedicate expander placement and with first set of lymphnodes removed, On June 20 she went for complete axillary lymph node dissection. She says she has had always been "full necked" but has neck and upper body fullness since  the cancer was diagnosed. She reports she has osteopenia in low back and has had a fracture that was discovered in 2014 when she had a Dexa scan. Pt has had metastasis to the bones of the spine and recent  bilateral breast surgery for a reduction on the right and expander removal on the left with implant placement and removal     Patient Stated Goals   to help manage pain and swelling in axilla ,prevent falls and continue with current quality of life                        [facility] Adult PT Treatment/Exercise - 01/23/18 0001      Modalities   Modalities  Moist Heat      Moist Heat Therapy   Number Minutes Moist Heat  10 Minutes    Moist Heat Location  Lumbar Spine      Manual Therapy   Soft tissue mobilization  to low back with half and half of biofreeze and massage cream to tight sore muscles     Myofascial Release  to left chest     Manual Lymphatic Drainage (MLD)  short neck, anterior interaxillary anastamosis, left axilla and lateral chest, then to sidelying for posterior back                PT Short Term Goals - 12/11/17 1045      PT SHORT TERM GOAL #3   Title  Vestibular goal: pt will participate in further assessment of gait with FGA and gait velocity    Time  4    Period  Weeks    Status  Unable to assess      PT SHORT TERM GOAL #4   Title  Vestibular goal: pt will participate in motion sensitivity and SOT testing with habituation HEP to be initiated    Time  4    Period  Weeks    Status  Unable to assess      Short Term Clinic Goals - 08/28/17 1118      CC Short Term Goal  #1   Title  Pt will report she knows strategies for good body mechanics to decrease low back pain     Status  Achieved      CC Short Term Goal  #2   Title  Pt will have a deacrease in circumference 15 cm prox to ulnar styloid of left forearm by 1 cm     Baseline  25 cm , on 05/16/2017 it is 23.5    Status  Achieved      CC Short Term Goal  #3   Title  Pt will report she  knows how to manage left UE lymphedema with use of compression, elevation and exercise, self manual lymph drainage and possibly Flexitouch     Baseline  Pt has day  and nighttime garments and Flexitouch     Status  Achieved       PT Long Term Goals - 01/13/18 1732      PT LONG TERM GOAL #1   Title  Pt will have decrease in left forearm circumference at 10 cm proximal to ulnar styolid to 20 cm     Baseline  22 cm on 01/03/2018     Period  Weeks    Status  New      PT LONG TERM GOAL #2   Title  Pt will have 150 degrees of shoulder flexion in supine indicating so that she will have greater ease in reaching activites in her daily life     Baseline  varies as pt has decreased ROM when she has increased pain and swelling     Time  8    Period  Weeks    Status  On-going      PT LONG TERM GOAL #3   Title  Pt will report she is participating in general exercise program of her choosing  3 times per week     Time  8    Period  Weeks    Status  New        Long Term Clinic Goals - 08/28/17 1119      CC Long Term Goal  #1   Title  Patient will report  that she can manage back pain  so she can perform daily activities with greater ease    Status  Achieved      CC Long Term Goal  #2   Title  Pt will  have given the TENS unit a trial to see if it will help with back pain     Baseline  has not needed to use TENS     Status  Deferred      CC Long Term Goal  #3   Title  Pt will be independent in core and  LE strengthening exercises       CC Long Term Goal  #4   Title  Pt will report low back pain is decreased to a 4/10 so that she can perform activities of daily living easier     Baseline  It depends on the day , but pt needs to take breaks and knows how to manage her pain, she feels this goal is achieved     Status  Achieved         Plan - 01/23/18 1914    Clinical Impression Statement  Pt states she has not been doing her exercises lately and thinks that may be contributing to her  back pain. She received relief from treatment today,but agrees she needs to add exercises to next treatement     PT Next Visit Plan   Core and LE exercise in supine and standing. Manual lymph drainage to left UE and back with myofascial release work to left chest        Patient will benefit from skilled therapeutic intervention in order to improve the following deficits and impairments:     Visit Diagnosis: Disorder of the skin and subcutaneous tissue related to radiation, unspecified  Lymphedema, not elsewhere classified  Abnormal posture  Chronic left shoulder pain  Low back pain with sciatica, sciatica laterality unspecified, unspecified back pain laterality, unspecified chronicity     Problem List Patient Active Problem List   Diagnosis Date Noted  . Pain from bone metastases ([place]) 09/11/2017  . Counseling regarding  goals of care 04/16/2017  . Bone metastases ([place]) 02/28/2017  . Uncontrolled pain 02/28/2017  . Osteopenia determined by x-ray 09/20/2016  . Primary malignant neoplasm of left breast with stage 2 nodal metastasis per American Joint Committee on Cancer 7th edition (N2) ([place]) 02/14/2016  . Chemotherapy-induced neuropathy ([place]) 12/30/2015  . Insomnia 09/01/2015  . Malignant neoplasm of upper-outer quadrant of left breast in female, estrogen receptor positive ([place]) 08/08/2015   Donato Heinz. Owens Shark PT   Norwood Levo 01/23/2018, 7:18 PM  [place], [place], [address] Phone: [phone]   Fax:  [phone]  Name: Alison [name] MRN: [id number] Date of Birth: Jan 13, 1961

## 2018-01-26 ENCOUNTER — Other Ambulatory Visit: Payer: Self-pay | Admitting: Oncology

## 2018-01-28 ENCOUNTER — Ambulatory Visit: Payer: 59 | Attending: General Surgery | Admitting: Physical Therapy

## 2018-01-28 ENCOUNTER — Encounter: Payer: Self-pay | Admitting: Physical Therapy

## 2018-01-28 DIAGNOSIS — L599 Disorder of the skin and subcutaneous tissue related to radiation, unspecified: Secondary | ICD-10-CM | POA: Diagnosis not present

## 2018-01-28 DIAGNOSIS — R2681 Unsteadiness on feet: Secondary | ICD-10-CM | POA: Insufficient documentation

## 2018-01-28 DIAGNOSIS — M25512 Pain in left shoulder: Secondary | ICD-10-CM | POA: Insufficient documentation

## 2018-01-28 DIAGNOSIS — R293 Abnormal posture: Secondary | ICD-10-CM | POA: Diagnosis present

## 2018-01-28 DIAGNOSIS — R221 Localized swelling, mass and lump, neck: Secondary | ICD-10-CM | POA: Diagnosis present

## 2018-01-28 DIAGNOSIS — M5440 Lumbago with sciatica, unspecified side: Secondary | ICD-10-CM | POA: Diagnosis present

## 2018-01-28 DIAGNOSIS — I890 Lymphedema, not elsewhere classified: Secondary | ICD-10-CM | POA: Diagnosis present

## 2018-01-28 DIAGNOSIS — G8929 Other chronic pain: Secondary | ICD-10-CM | POA: Diagnosis present

## 2018-01-28 NOTE — Therapy (Signed)
[place], [place], [address] Phone: [phone]   Fax:  [phone]  Physical Therapy Treatment  Patient Details  Name: Alison [name] MRN: [id number] Date of Birth: 1961-02-20 Referring Provider: Dr. [name]    Encounter Date: 01/28/2018  PT End of Session - 01/28/18 1728    Visit Number  19    Number of Visits  42    Date for PT Re-Evaluation  03/15/18    PT Start Time  1430    PT Stop Time  1515    PT Time Calculation (min)  45 min    Activity Tolerance  Patient tolerated treatment well    Behavior During Therapy  [facility] for tasks assessed/performed       Past Medical History:  Diagnosis Date  . Anxiety   . Asthma     seasonal asthma  . Breast cancer ([place])    metastatic breast cancer  . Complication of anesthesia 2007   aspiration pneumonia post hysterectomy.  Blood pressure would drop low- but no problems 01/2016- surgery  . History of blood transfusion   . History of mitral valve prolapse 1988   with palpitations  . History of radiation therapy 04/02/16- 05/21/16   Left Chest Wall, SCV, Axilla, IM nodes  . Hypertension    no meds  . Neuropathy    with chemo  . Osteopenia determined by x-ray   . Pneumonia    1980's and 1990's  . Raynaud's disease   . 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 Skin cancer 03/12/2014   basal- back    Past Surgical History:  Procedure Laterality Date  . ABDOMINAL HYSTERECTOMY  2007   with BSO  . APPENDECTOMY  2007  . AXILLARY LYMPH NODE DISSECTION Left 02/14/2016   Procedure: LEFT AXILLARY LYMPH NODE DISSECTION;  Surgeon: [name], MD;  Location: [place];  Service: General;  Laterality: Left;  . BREAST RECONSTRUCTION WITH PLACEMENT OF TISSUE EXPANDER AND FLEX HD (ACELLULAR HYDRATED DERMIS) Left 02/02/2016   Procedure: IMMEDIATE LEFT BREAST RECONSTRUCTION WITH PLACEMENT OF TISSUE EXPANDER AND FLEX HD (ACELLULAR HYDRATED  DERMIS);  Surgeon: [name], DO;  Location: [place];  Service: Plastics;  Laterality: Left;  . BREAST REDUCTION SURGERY Right 03/28/2017   Procedure: RIGHT MAMMARY REDUCTION  (BREAST);  Surgeon: [name], DO;  Location: [place];  Service: Plastics;  Laterality: Right;  . BREAST SURGERY Left 02/02/16   mastectomy with reconstruction  . c-section  1991  . COLON RESECTION SIGMOID  12/02/2012   [facility]   . HERNIA REPAIR  2007  . LAPAROSCOPIC ENDOMETRIOSIS FULGURATION  1984, 1989  . MASTOPEXY Right 03/28/2017   Procedure: MASTOPEXY;  Surgeon: [name], DO;  Location: [place];  Service: Plastics;  Laterality: Right;  . Port- a- cath placement  07/2015  . PORT-A-CATH REMOVAL Right 02/02/2016   Procedure: REMOVAL PORT-A-CATH;  Surgeon: [name], MD;  Location: [place];  Service: General;  Laterality: Right;  . RADIOACTIVE SEED GUIDED AXILLARY SENTINEL LYMPH NODE Left 02/02/2016   Procedure: LEFT BREAST MASTECTOMY AND RADIOACTIVE SEED GUIDED LEFT SENTINEL LYMPH NODE BIOPSY ;  Surgeon: [name], MD;  Location: [place];  Service: General;  Laterality: Left;  . REMOVAL OF TISSUE EXPANDER AND PLACEMENT OF IMPLANT Left 03/28/2017   Procedure: REMOVAL OF TISSUE EXPANDER AND PLACEMENT OF IMPLANT;  Surgeon: [name], DO;  Location: [place];  Service: Plastics;  Laterality: Left;    There were no vitals filed for this visit.  Subjective Assessment - 01/28/18 1445    Subjective  Pt states her back is feeling better.  She has been paying attention to her body mechanics.     Pertinent History  Breast cancer diagnosed November 2016 She had chemotherapy. but did have some neuropathy so it had to be stopped. On June 8, she had a mastectomy with immedicate expander placement and with first set of lymphnodes removed, On June 20 she went for complete axillary lymph node  dissection. She says she has had always been "full necked" but has neck and upper body fullness since the cancer was diagnosed. She reports she has osteopenia in low back and has had a fracture that was discovered in 2014 when she had a Dexa scan. Pt has had metastasis to the bones of the spine and recent  bilateral breast surgery for a reduction on the right and expander removal on the left with implant placement and removal     Patient Stated Goals   to help manage pain and swelling in axilla ,prevent falls and continue with current quality of life                        [facility] Adult PT Treatment/Exercise - 01/28/18 0001      Lumbar Exercises: Supine   Ab Set  5 reps    Clam  10 reps    Bent Knee Raise  10 reps    Bent Knee Raise Limitations  unilateral     Other Supine Lumbar Exercises  hip adduction       Modalities   Modalities  Moist Heat      Moist Heat Therapy   Number Minutes Moist Heat  10 Minutes    Moist Heat Location  Lumbar Spine      Manual Therapy   Soft tissue mobilization  to low back with half and half of biofreeze and massage cream to tight sore muscles     Myofascial Release  to left chest     Manual Lymphatic Drainage (MLD)  short neck, anterior interaxillary anastamosis, left axilla and lateral chest, then to sidelying for posterior back                PT Short Term Goals - 12/11/17 1045      PT SHORT TERM GOAL #3   Title  Vestibular goal: pt will participate in further assessment of gait with FGA and gait velocity    Time  4    Period  Weeks    Status  Unable to assess      PT SHORT TERM GOAL #4   Title  Vestibular goal: pt will participate in motion sensitivity and SOT testing with habituation HEP to be initiated    Time  4    Period  Weeks    Status  Unable to assess      Short Term Clinic Goals - 08/28/17 1118      CC Short Term Goal  #1   Title  Pt will report she knows strategies for good body mechanics to decrease low  back pain     Status  Achieved      CC Short Term Goal  #2   Title  Pt will have a deacrease in circumference 15 cm prox to ulnar styloid of left forearm by 1 cm  Baseline  25 cm , on 05/16/2017 it is 23.5    Status  Achieved      CC Short Term Goal  #3   Title  Pt will report she knows how to manage left UE lymphedema with use of compression, elevation and exercise, self manual lymph drainage and possibly Flexitouch     Baseline  Pt has day and nighttime garments and Flexitouch     Status  Achieved       PT Long Term Goals - 01/13/18 1732      PT LONG TERM GOAL #1   Title  Pt will have decrease in left forearm circumference at 10 cm proximal to ulnar styolid to 20 cm     Baseline  22 cm on 01/03/2018     Period  Weeks    Status  New      PT LONG TERM GOAL #2   Title  Pt will have 150 degrees of shoulder flexion in supine indicating so that she will have greater ease in reaching activites in her daily life     Baseline  varies as pt has decreased ROM when she has increased pain and swelling     Time  8    Period  Weeks    Status  On-going      PT LONG TERM GOAL #3   Title  Pt will report she is participating in general exercise program of her choosing  3 times per week     Time  8    Period  Weeks    Status  New        Long Term Clinic Goals - 08/28/17 1119      CC Long Term Goal  #1   Title  Patient will report  that she can manage back pain  so she can perform daily activities with greater ease    Status  Achieved      CC Long Term Goal  #2   Title  Pt will  have given the TENS unit a trial to see if it will help with back pain     Baseline  has not needed to use TENS     Status  Deferred      CC Long Term Goal  #3   Title  Pt will be independent in core and  LE strengthening exercises       CC Long Term Goal  #4   Title  Pt will report low back pain is decreased to a 4/10 so that she can perform activities of daily living easier     Baseline  It depends on  the day , but pt needs to take breaks and knows how to manage her pain, she feels this goal is achieved     Status  Achieved         Plan - 01/28/18 1728    Clinical Impression Statement  Pt states she is doing better with her back pain and has been paying attention to her body mechanics.  She reports she had little back pain for 2 days after last treatment. Upgraded session today to include core activation exercises     Clinical Impairments Affecting Rehab Potential  mulitple areas of diffuse bony metastasis, chemotherapy with neuropathy , radiation to back  and chest,    PT Frequency  2x / week    PT Duration  8 weeks    PT Treatment/Interventions  ADLs/Self Care Home Management;Manual lymph drainage;Compression bandaging;Scar mobilization;Passive  range of motion;Patient/family education;Taping;Manual techniques;Therapeutic exercise;Therapeutic activities;Orthotic Fit/Training;DME Instruction;Electrical Stimulation;Moist Heat;Gait training;Stair training;Functional mobility training;Balance training;Neuromuscular re-education;Vestibular    PT Next Visit Plan   Core and LE exercise in supine and standing. Manual lymph drainage to left UE and back with myofascial release work to left chest        Patient will benefit from skilled therapeutic intervention in order to improve the following deficits and impairments:  Decreased range of motion, Impaired UE functional use, Decreased activity tolerance, Decreased knowledge of precautions, Decreased skin integrity, Impaired perceived functional ability, Pain, Decreased knowledge of use of DME, Increased edema, Postural dysfunction, Decreased strength, Impaired sensation, Decreased scar mobility, Decreased mobility, Increased muscle spasms, Dizziness, Increased fascial restricitons, Decreased balance, Difficulty walking  Visit Diagnosis: Disorder of the skin and subcutaneous tissue related to radiation, unspecified  Lymphedema, not elsewhere  classified  Abnormal posture  Chronic left shoulder pain  Low back pain with sciatica, sciatica laterality unspecified, unspecified back pain laterality, unspecified chronicity     Problem List Patient Active Problem List   Diagnosis Date Noted  . Pain from bone metastases ([place]) 09/11/2017  . Counseling regarding goals of care 04/16/2017  . Bone metastases ([place]) 02/28/2017  . Uncontrolled pain 02/28/2017  . Osteopenia determined by x-ray 09/20/2016  . Primary malignant neoplasm of left breast with stage 2 nodal metastasis per American Joint Committee on Cancer 7th edition (N2) ([place]) 02/14/2016  . Chemotherapy-induced neuropathy ([place]) 12/30/2015  . Insomnia 09/01/2015  . Malignant neoplasm of upper-outer quadrant of left breast in female, estrogen receptor positive ([place]) 08/08/2015   Donato Heinz. Owens Shark PT  Norwood Levo 01/28/2018, 5:31 PM  [place], [place], [address] Phone: [phone]   Fax:  [phone]  Name: Alison [name] MRN: [id number] Date of Birth: 06-Feb-1961

## 2018-01-30 ENCOUNTER — Encounter: Payer: Self-pay | Admitting: Physical Therapy

## 2018-01-30 ENCOUNTER — Ambulatory Visit: Payer: 59 | Admitting: Physical Therapy

## 2018-01-30 DIAGNOSIS — R221 Localized swelling, mass and lump, neck: Secondary | ICD-10-CM

## 2018-01-30 DIAGNOSIS — M5440 Lumbago with sciatica, unspecified side: Secondary | ICD-10-CM

## 2018-01-30 DIAGNOSIS — G8929 Other chronic pain: Secondary | ICD-10-CM

## 2018-01-30 DIAGNOSIS — L599 Disorder of the skin and subcutaneous tissue related to radiation, unspecified: Secondary | ICD-10-CM

## 2018-01-30 DIAGNOSIS — R293 Abnormal posture: Secondary | ICD-10-CM

## 2018-01-30 DIAGNOSIS — M25512 Pain in left shoulder: Secondary | ICD-10-CM

## 2018-01-30 NOTE — Therapy (Signed)
Alison [name], [place], [address] Phone: [phone]   Fax:  [phone]  Physical Therapy Treatment  Patient Details  Name: Alison [name] MRN: [id number] Date of Birth: 07-15-1961 Referring Provider: Dr. [name]    Encounter Date: 01/30/2018  PT End of Session - 01/30/18 1932    Visit Number  20    Number of Visits  42    Date for PT Re-Evaluation  03/15/18    PT Start Time  1430    PT Stop Time  1515    PT Time Calculation (min)  45 min    Activity Tolerance  Patient tolerated treatment well    Behavior During Therapy  [facility] for tasks assessed/performed       Past Medical History:  Diagnosis Date  . Anxiety   . Asthma     seasonal asthma  . Breast cancer ([place])    metastatic breast cancer  . Complication of anesthesia 2007   aspiration pneumonia post hysterectomy.  Blood pressure would drop low- but no problems 01/2016- surgery  . History of blood transfusion   . History of mitral valve prolapse 1988   with palpitations  . History of radiation therapy 04/02/16- 05/21/16   Left Chest Wall, SCV, Axilla, IM nodes  . Hypertension    no meds  . Neuropathy    with chemo  . Osteopenia determined by x-ray   . Pneumonia    1980's and 1990's  . Raynaud's disease   . 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 Skin cancer 03/12/2014   basal- back    Past Surgical History:  Procedure Laterality Date  . ABDOMINAL HYSTERECTOMY  2007   with BSO  . APPENDECTOMY  2007  . AXILLARY LYMPH NODE DISSECTION Left 02/14/2016   Procedure: LEFT AXILLARY LYMPH NODE DISSECTION;  Surgeon: [name], MD;  Location: [place];  Service: General;  Laterality: Left;  . BREAST RECONSTRUCTION WITH PLACEMENT OF TISSUE EXPANDER AND FLEX HD (ACELLULAR HYDRATED DERMIS) Left 02/02/2016   Procedure: IMMEDIATE LEFT BREAST RECONSTRUCTION WITH PLACEMENT OF TISSUE EXPANDER AND FLEX HD (ACELLULAR HYDRATED  DERMIS);  Surgeon: [name], DO;  Location: [place];  Service: Plastics;  Laterality: Left;  . BREAST REDUCTION SURGERY Right 03/28/2017   Procedure: RIGHT MAMMARY REDUCTION  (BREAST);  Surgeon: [name], DO;  Location: [place];  Service: Plastics;  Laterality: Right;  . BREAST SURGERY Left 02/02/16   mastectomy with reconstruction  . c-section  1991  . COLON RESECTION SIGMOID  12/02/2012   [facility]   . HERNIA REPAIR  2007  . LAPAROSCOPIC ENDOMETRIOSIS FULGURATION  1984, 1989  . MASTOPEXY Right 03/28/2017   Procedure: MASTOPEXY;  Surgeon: [name], DO;  Location: [place];  Service: Plastics;  Laterality: Right;  . Port- a- cath placement  07/2015  . PORT-A-CATH REMOVAL Right 02/02/2016   Procedure: REMOVAL PORT-A-CATH;  Surgeon: [name], MD;  Location: [place];  Service: General;  Laterality: Right;  . RADIOACTIVE SEED GUIDED AXILLARY SENTINEL LYMPH NODE Left 02/02/2016   Procedure: LEFT BREAST MASTECTOMY AND RADIOACTIVE SEED GUIDED LEFT SENTINEL LYMPH NODE BIOPSY ;  Surgeon: [name], MD;  Location: [place];  Service: General;  Laterality: Left;  . REMOVAL OF TISSUE EXPANDER AND PLACEMENT OF IMPLANT Left 03/28/2017   Procedure: REMOVAL OF TISSUE EXPANDER AND PLACEMENT OF IMPLANT;  Surgeon: [name], DO;  Location: [place];  Service: Plastics;  Laterality: Left;    There were no vitals filed for this visit.  Subjective Assessment - 01/30/18 1446    Subjective  [name] [place] [facility] [place] [facility] and had a wonderful experience.  She said that she plans to go back again and feels that it will help her with her balance and strength.     Pertinent History  Breast cancer diagnosed November 2016 She had chemotherapy. but did have some neuropathy so it had to be stopped. On June 8, she had a mastectomy with immedicate expander placement and with  first set of lymphnodes removed, On June 20 she went for complete axillary lymph node dissection. She says she has had always been "full necked" but has neck and upper body fullness since the cancer was diagnosed. She reports she has osteopenia in low back and has had a fracture that was discovered in 2014 when she had a Dexa scan. 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  No/denies                       [facility] Adult PT Treatment/Exercise - 01/30/18 0001      Self-Care   Self-Care  Other Self-Care Comments    Other Self-Care Comments   Reviewed how to apply [employer] arm sleeve and hand piece.  Found  a video online and sent to to pt so she will be able to do it on her own at home. Pt very pleased to have this information       Lumbar Exercises: Supine   Clam  10 reps with each leg     Bridge  10 reps    Bridge Limitations  wit purple ball between knees     Other Supine Lumbar Exercises  ball squeeze and lower core strength       Shoulder Exercises: Supine   Other Supine Exercises  supine dowel rod flexion and abudciton x 10 reps              PT Education - 01/30/18 1931    Education provided  Yes    Education Details  how to apply compression wrap to hand and arm     Person(s) Educated  Patient    Methods  Explanation;Demonstration;Verbal cues;Other (comment) link to online video    Comprehension  Verbalized understanding;Returned demonstration       PT Short Term Goals - 12/11/17 1045      PT SHORT TERM GOAL #3   Title  Vestibular goal: pt will participate in further assessment of gait with FGA and gait velocity    Time  4    Period  Weeks    Status  Unable to  assess      PT SHORT TERM GOAL #4   Title  Vestibular goal: pt will participate in motion sensitivity and SOT testing with habituation HEP to be initiated    Time  4    Period  Weeks    Status  Unable to assess      Short Term Clinic Goals - 08/28/17 1118  CC Short Term Goal  #1   Title  Pt will report she knows strategies for good body mechanics to decrease low back pain     Status  Achieved      CC Short Term Goal  #2   Title  Pt will have a deacrease in circumference 15 cm prox to ulnar styloid of left forearm by 1 cm     Baseline  25 cm , on 05/16/2017 it is 23.5    Status  Achieved      CC Short Term Goal  #3   Title  Pt will report she knows how to manage left UE lymphedema with use of compression, elevation and exercise, self manual lymph drainage and possibly Flexitouch     Baseline  Pt has day and nighttime garments and Flexitouch     Status  Achieved       PT Long Term Goals - 01/13/18 1732      PT LONG TERM GOAL #1   Title  Pt will have decrease in left forearm circumference at 10 cm proximal to ulnar styolid to 20 cm     Baseline  22 cm on 01/03/2018     Period  Weeks    Status  New      PT LONG TERM GOAL #2   Title  Pt will have 150 degrees of shoulder flexion in supine indicating so that she will have greater ease in reaching activites in her daily life     Baseline  varies as pt has decreased ROM when she has increased pain and swelling     Time  8    Period  Weeks    Status  On-going      PT LONG TERM GOAL #3   Title  Pt will report she is participating in general exercise program of her choosing  3 times per week     Time  8    Period  Weeks    Status  New        Long Term Clinic Goals - 08/28/17 1119      CC Long Term Goal  #1   Title  Patient will report  that she can manage back pain  so she can perform daily activities with greater ease    Status  Achieved      CC Long Term Goal  #2   Title  Pt will  have given the TENS unit a trial to  see if it will help with back pain     Baseline  has not needed to use TENS     Status  Deferred      CC Long Term Goal  #3   Title  Pt will be independent in core and  LE strengthening exercises       CC Long Term Goal  #4   Title  Pt will report low back pain is decreased to a 4/10 so that she can perform activities of daily living easier     Baseline  It depends on the day , but pt needs to take breaks and knows how to manage her pain, she feels this goal is achieved     Status  Achieved         Plan - 01/30/18 1932    Clinical Impression Statement  Pt is making progress toward independent self care with community exercise and iniation in learning about using compression.  She is anticipating she may have swelling in her  arm when she goes to the beach for vacation next month.  She did well with core exercise today with no increase in back pain.     PT Frequency  2x / week    PT Duration  8 weeks    PT Next Visit Plan   Core and LE exercise in supine and standing. Manual lymph drainage to left UE and back with myofascial release work to left chest Progress exercise and continue encouragement of community exercise     Consulted and Agree with Plan of Care  Patient       Patient will benefit from skilled therapeutic intervention in order to improve the following deficits and impairments:     Visit Diagnosis: Disorder of the skin and subcutaneous tissue related to radiation, unspecified  Abnormal posture  Chronic left shoulder pain  Low back pain with sciatica, sciatica laterality unspecified, unspecified back pain laterality, unspecified chronicity  Localized swelling, mass and lump, neck     Problem List Patient Active Problem List   Diagnosis Date Noted  . Pain from bone metastases ([place]) 09/11/2017  . Counseling regarding goals of care 04/16/2017  . Bone metastases ([place]) 02/28/2017  . Uncontrolled pain 02/28/2017  . Osteopenia determined by x-ray 09/20/2016  .  Primary malignant neoplasm of left breast with stage 2 nodal metastasis per American Joint Committee on Cancer 7th edition (N2) ([place]) 02/14/2016  . Chemotherapy-induced neuropathy ([place]) 12/30/2015  . Insomnia 09/01/2015  . Malignant neoplasm of upper-outer quadrant of left breast in female, estrogen receptor positive ([place]) 08/08/2015   Donato Heinz. Owens Shark PT  Norwood Levo 01/30/2018, 7:39 PM  [place], [place], [address] Phone: [phone]   Fax:  [phone]  Name: Alison [name] MRN: [id number] Date of Birth: 08/12/1961

## 2018-01-30 NOTE — Patient Instructions (Signed)

## 2018-02-03 ENCOUNTER — Encounter (HOSPITAL_COMMUNITY)
Admission: RE | Admit: 2018-02-03 | Discharge: 2018-02-03 | Disposition: A | Discharge status: Home / Self Care | Payer: 59 | Source: Ambulatory Visit | Attending: Oncology | Admitting: Oncology

## 2018-02-03 DIAGNOSIS — Z170 Estrogen receptor positive status [ER+]: Secondary | ICD-10-CM | POA: Insufficient documentation

## 2018-02-03 DIAGNOSIS — C50412 Malignant neoplasm of upper-outer quadrant of left female breast: Secondary | ICD-10-CM | POA: Insufficient documentation

## 2018-02-03 DIAGNOSIS — C7951 Secondary malignant neoplasm of bone: Secondary | ICD-10-CM | POA: Insufficient documentation

## 2018-02-03 DIAGNOSIS — C50912 Malignant neoplasm of unspecified site of left female breast: Secondary | ICD-10-CM | POA: Insufficient documentation

## 2018-02-03 DIAGNOSIS — C779 Secondary and unspecified malignant neoplasm of lymph node, unspecified: Secondary | ICD-10-CM | POA: Insufficient documentation

## 2018-02-03 LAB — GLUCOSE, CAPILLARY: Glucose-Capillary: 128 mg/dL — ABNORMAL HIGH (ref 65–99)

## 2018-02-03 MED ORDER — FLUDEOXYGLUCOSE F - 18 (FDG) INJECTION
7.5000 | Freq: Once | INTRAVENOUS | Status: AC | PRN
  Administered 2018-02-03: 7.5 via INTRAVENOUS

## 2018-02-06 ENCOUNTER — Encounter: Payer: Self-pay | Admitting: Physical Therapy

## 2018-02-06 ENCOUNTER — Ambulatory Visit: Payer: 59 | Admitting: Physical Therapy

## 2018-02-06 DIAGNOSIS — R293 Abnormal posture: Secondary | ICD-10-CM

## 2018-02-06 DIAGNOSIS — L599 Disorder of the skin and subcutaneous tissue related to radiation, unspecified: Secondary | ICD-10-CM

## 2018-02-06 DIAGNOSIS — R221 Localized swelling, mass and lump, neck: Secondary | ICD-10-CM

## 2018-02-06 DIAGNOSIS — M25512 Pain in left shoulder: Secondary | ICD-10-CM

## 2018-02-06 DIAGNOSIS — G8929 Other chronic pain: Secondary | ICD-10-CM

## 2018-02-06 DIAGNOSIS — M5440 Lumbago with sciatica, unspecified side: Secondary | ICD-10-CM

## 2018-02-06 NOTE — Therapy (Signed)
[place], [place], [address] Phone: [phone]   Fax:  [phone]  Physical Therapy Treatment  Patient Details  Name: Alison [name] MRN: [id number] Date of Birth: 1961/06/19 Referring Provider: Dr. [name]    Encounter Date: 02/06/2018  PT End of Session - 02/06/18 1707    Visit Number  21    Number of Visits  42    Date for PT Re-Evaluation  03/15/18    PT Start Time  1300    PT Stop Time  1345    PT Time Calculation (min)  45 min    Activity Tolerance  Patient tolerated treatment well    Behavior During Therapy  [facility] for tasks assessed/performed       Past Medical History:  Diagnosis Date  . Anxiety   . Asthma     seasonal asthma  . Breast cancer ([place])    metastatic breast cancer  . Complication of anesthesia 2007   aspiration pneumonia post hysterectomy.  Blood pressure would drop low- but no problems 01/2016- surgery  . History of blood transfusion   . History of mitral valve prolapse 1988   with palpitations  . History of radiation therapy 04/02/16- 05/21/16   Left Chest Wall, SCV, Axilla, IM nodes  . Hypertension    no meds  . Neuropathy    with chemo  . Osteopenia determined by x-ray   . Pneumonia    1980's and 1990's  . Raynaud's disease   . 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 Skin cancer 03/12/2014   basal- back    Past Surgical History:  Procedure Laterality Date  . ABDOMINAL HYSTERECTOMY  2007   with BSO  . APPENDECTOMY  2007  . AXILLARY LYMPH NODE DISSECTION Left 02/14/2016   Procedure: LEFT AXILLARY LYMPH NODE DISSECTION;  Surgeon: [name], MD;  Location: [place];  Service: General;  Laterality: Left;  . BREAST RECONSTRUCTION WITH PLACEMENT OF TISSUE EXPANDER AND FLEX HD (ACELLULAR HYDRATED DERMIS) Left 02/02/2016   Procedure: IMMEDIATE LEFT BREAST RECONSTRUCTION WITH PLACEMENT OF TISSUE EXPANDER AND FLEX HD (ACELLULAR HYDRATED  DERMIS);  Surgeon: [name], DO;  Location: [place];  Service: Plastics;  Laterality: Left;  . BREAST REDUCTION SURGERY Right 03/28/2017   Procedure: RIGHT MAMMARY REDUCTION  (BREAST);  Surgeon: [name], DO;  Location: [place];  Service: Plastics;  Laterality: Right;  . BREAST SURGERY Left 02/02/16   mastectomy with reconstruction  . c-section  1991  . COLON RESECTION SIGMOID  12/02/2012   [facility]   . HERNIA REPAIR  2007  . LAPAROSCOPIC ENDOMETRIOSIS FULGURATION  1984, 1989  . MASTOPEXY Right 03/28/2017   Procedure: MASTOPEXY;  Surgeon: [name], DO;  Location: [place];  Service: Plastics;  Laterality: Right;  . Port- a- cath placement  07/2015  . PORT-A-CATH REMOVAL Right 02/02/2016   Procedure: REMOVAL PORT-A-CATH;  Surgeon: [name], MD;  Location: [place];  Service: General;  Laterality: Right;  . RADIOACTIVE SEED GUIDED AXILLARY SENTINEL LYMPH NODE Left 02/02/2016   Procedure: LEFT BREAST MASTECTOMY AND RADIOACTIVE SEED GUIDED LEFT SENTINEL LYMPH NODE BIOPSY ;  Surgeon: [name], MD;  Location: [place];  Service: General;  Laterality: Left;  . REMOVAL OF TISSUE EXPANDER AND PLACEMENT OF IMPLANT Left 03/28/2017   Procedure: REMOVAL OF TISSUE EXPANDER AND PLACEMENT OF IMPLANT;  Surgeon: [name], DO;  Location: [place];  Service: Plastics;  Laterality: Left;    There were no vitals filed for this visit.  Subjective Assessment - 02/06/18 1705    Subjective  Alison [name] was not able to go to [place] this week.  She states she back has been hurting a little more.  She has a PET scan earlier this week     Pertinent History  Breast cancer diagnosed November 2016 She had chemotherapy. but did have some neuropathy so it had to be stopped. On June 8, she had a mastectomy with immedicate expander placement and with first set of lymphnodes removed, On June  20 she went for complete axillary lymph node dissection. She says she has had always been "full necked" but has neck and upper body fullness since the cancer was diagnosed. She reports she has osteopenia in low back and has had a fracture that was discovered in 2014 when she had a Dexa scan. 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  Yes    Pain Score  -- did not rate     Pain Location  Back                       [facility] Adult PT Treatment/Exercise - 02/06/18 0001      Lumbar Exercises: Supine   Clam  10 reps with each leg     Bent Knee Raise  10 reps    Bent Knee Raise Limitations  unilateral     Other Supine Lumbar Exercises  ball squeeze and lower core strength       Lumbar Exercises: Sidelying   Hip Abduction  10 reps      Shoulder Exercises: Sidelying   ABduction  AROM;Right;Left;10 reps      Manual Therapy   Soft tissue mobilization  to low back with half and half of biofreeze and massage cream to tight sore muscles     Myofascial Release  to left chest     Manual Lymphatic Drainage (MLD)  short neck, anterior interaxillary anastamosis, left axilla and lateral chest, then to sidelying for posterior back                PT Short Term Goals - 12/11/17 1045      PT SHORT TERM GOAL #3   Title  Vestibular goal: pt will participate in further assessment of gait with FGA and gait velocity    Time  4    Period  Weeks    Status  Unable to assess      PT SHORT TERM GOAL #4   Title  Vestibular goal: pt will participate in motion sensitivity and SOT testing with habituation HEP to be initiated    Time  4    Period  Weeks    Status  Unable to assess      Short Term Clinic  Goals - 08/28/17 1118      CC Short Term Goal  #1   Title  Pt will report she knows strategies for good body mechanics to decrease low back pain     Status  Achieved  CC Short Term Goal  #2   Title  Pt will have a deacrease in circumference 15 cm prox to ulnar styloid of left forearm by 1 cm     Baseline  25 cm , on 05/16/2017 it is 23.5    Status  Achieved      CC Short Term Goal  #3   Title  Pt will report she knows how to manage left UE lymphedema with use of compression, elevation and exercise, self manual lymph drainage and possibly Flexitouch     Baseline  Pt has day and nighttime garments and Flexitouch     Status  Achieved       PT Long Term Goals - 01/13/18 1732      PT LONG TERM GOAL #1   Title  Pt will have decrease in left forearm circumference at 10 cm proximal to ulnar styolid to 20 cm     Baseline  22 cm on 01/03/2018     Period  Weeks    Status  New      PT LONG TERM GOAL #2   Title  Pt will have 150 degrees of shoulder flexion in supine indicating so that she will have greater ease in reaching activites in her daily life     Baseline  varies as pt has decreased ROM when she has increased pain and swelling     Time  8    Period  Weeks    Status  On-going      PT LONG TERM GOAL #3   Title  Pt will report she is participating in general exercise program of her choosing  3 times per week     Time  [place] [facility] Goals - 08/28/17 1119      CC Long Term Goal  #1   Title  Patient will report  that she can manage back pain  so she can perform daily activities with greater ease    Status  Achieved      CC Long Term Goal  #2   Title  Pt will  have given the TENS unit a trial to see if it will help with back pain     Baseline  has not needed to use TENS     Status  Deferred      CC Long Term Goal  #3   Title  Pt will be independent in core and  LE strengthening exercises       CC Long Term Goal  #4   Title  Pt  will report low back pain is decreased to a 4/10 so that she can perform activities of daily living easier     Baseline  It depends on the day , but pt needs to take breaks and knows how to manage her pain, she feels this goal is achieved     Status  Achieved         Plan - 02/06/18 [place] continues to get symptomatic relief from treatment and reports that she needs to continue to do core strengthening to keep her functional while dealing with her back pain     Clinical Impairments Affecting Rehab Potential  mulitple areas of diffuse bony metastasis, chemotherapy with neuropathy , radiation to back  and chest,    PT Next Visit Plan   Core  and LE exercise in supine and standing. Manual lymph drainage to left UE and back with myofascial release work to left chest Progress exercise and continue encouragement of community exercise     Consulted and Agree with Plan of Care  Patient       Patient will benefit from skilled therapeutic intervention in order to improve the following deficits and impairments:  Decreased range of motion, Impaired UE functional use, Decreased activity tolerance, Decreased knowledge of precautions, Decreased skin integrity, Impaired perceived functional ability, Pain, Decreased knowledge of use of DME, Increased edema, Postural dysfunction, Decreased strength, Impaired sensation, Decreased scar mobility, Decreased mobility, Increased muscle spasms, Dizziness, Increased fascial restricitons, Decreased balance, Difficulty walking  Visit Diagnosis: Disorder of the skin and subcutaneous tissue related to radiation, unspecified  Abnormal posture  Chronic left shoulder pain  Low back pain with sciatica, sciatica laterality unspecified, unspecified back pain laterality, unspecified chronicity  Localized swelling, mass and lump, neck     Problem List Patient Active Problem List   Diagnosis Date Noted  . Pain from bone metastases  ([place]) 09/11/2017  . Counseling regarding goals of care 04/16/2017  . Bone metastases ([place]) 02/28/2017  . Uncontrolled pain 02/28/2017  . Osteopenia determined by x-ray 09/20/2016  . Primary malignant neoplasm of left breast with stage 2 nodal metastasis per American Joint Committee on Cancer 7th edition (N2) ([place]) 02/14/2016  . Chemotherapy-induced neuropathy ([place]) 12/30/2015  . Insomnia 09/01/2015  . Malignant neoplasm of upper-outer quadrant of left breast in female, estrogen receptor positive ([place]) 08/08/2015   Donato Heinz. Owens Shark PT  Norwood Levo 02/06/2018, 5:10 PM  [place], [place], [address] Phone: [phone]   Fax:  [phone]  Name: Alison [name] MRN: [id number] Date of Birth: 03/03/1961

## 2018-02-11 NOTE — Progress Notes (Signed)
[place]  [phone] [phone]   ID: Alison [name] DOB: 1961/05/12  MR#: [id number]  AOZ#:[id number]  Patient Care Team: [name], MD as PCP - General (Internal Medicine) Erroll Luna, MD as Consulting Physician (General Surgery) Magrinat, Virgie Dad, MD as Consulting Physician (Oncology) Aloha Gell, MD as Consulting Physician (Obstetrics and Gynecology) Harriett Sine, MD as Consulting Physician (Dermatology) Benson Norway, RN as Registered Nurse (Oncology) Eppie Gibson, MD as Attending Physician (Radiation Oncology) Venetia Night, MD (Hematology and Oncology) PCP: [name], MD OTHER MD:  CHIEF COMPLAINT: Estrogen receptor positive breast cancer  CURRENT TREATMENT: fulvestrant, abemaciclib, denosumab/Xgeva  INTERVAL HISTORY: Alison [name] returns today for a follow-up and treatment of her 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 She continues on abemaciclib, with fair tolerance. She feel nauseous on this medication. She takes ondansetron as needed.   She also receives monthly fulvestrant with her most recent dose on 02/11/2018. She denies any diarrhea from this.   Finally she receives [employer]. She also tolerates this well without complications. She denies having bony aches after this shot.   Since her last visit, she completed a PET scan on 02/03/2018 showing: Largely stable hypermetabolic bone metastases although some new areas of bony hypermetabolism are evident on today's study. No evidence for soft tissue or visceral hypermetabolic metastatic involvement. Aortic Atherosclerois (ICD10-170.0)    REVIEW OF SYSTEMS: Alison [name]  reports that she is doing good. She sees Dr. Maryjean Ka on 02/04/2018 and she returns to see him in September. She is taking Soma as a muscle relaxer in order for her to complete her daily functions. She takes about 5 per day oxycodone for pain. She also  takes dulcolax every 2-3 days as needed for constipation. She previously took Miralax but it cause bowel leakage. She is [place] [facility] this [place]. She has also been going to lunch. She denies unusual headaches, visual changes, nausea, vomiting, or dizziness. There has been no unusual cough, phlegm production, or pleurisy. This been no change in bowel or bladder habits. She denies unexplained fatigue or unexplained weight loss, bleeding, rash, or fever. A detailed review of systems was otherwise stable.    BREAST CANCER HISTORY: From the original intake note:  Alison [name] had routine screening mammography with tomography [facility] February 20 11/14/2014 showing a possible mass in the right breast. Diagnostic right mammography with right breast ultrasonography 11/01/2014 found the breast density to be category C. In the upper outer quadrant of the right breast there was a partially obscured mass which on physical exam was palpable as an area of thickening at the 10:00 position 8 cm from the nipple. Ultrasound showed multiple cysts in the upper outer right breast corresponding to the mass in question.  In November 2016 the patient felt a change in the upper left breast and brought it to her primary care physician's attention. Diagnostic left mammography with tomosynthesis and left breast ultrasonography at the [facility] December 01/14/2015 found trabecular thickening throughout the left breast with skin thickening, but no circumscribed mass or suspicious calcifications. A rounded mass in the left axilla measured 6 mm. There was an additional mildly prominent lymph node in the left axilla which was what the patient had been palpating. Ultrasonography showed ill-defined swelling throughout the inner left breast with overlying skin thickening.. At the 2:00 position 4 cm from the nipple there was an irregular hypoechoic mass measuring 2.3  cm. A second mass at the  1:00 area 3 cm from the nipple measured 1 cm. The distance between these 2 masses was 1.8 cm. There was also a round hypoechoic left axillary mass measuring 0.6 cm. Overall the was an area of 3.7 cm of mixed echogenicity corresponding to the area of palpable soft tissue swelling.   On 08/04/2015 the patient underwent biopsy of the mass at the 2:00 axis 4 cm from the nipple and a second biopsy was performed of the hypoechoic mass at the 1:00 position 3 cm from the nipple. Biopsy of the suspicious nodule in the lower left axilla was attempted but could not be completed as the mass became obscured after administration of lidocaine.  The pathology from both left breast biopsies 08/04/2015 ([id number]) showed invasive ductal carcinoma, grade 3. The prognostic profile obtained from the larger tumor showed estrogen receptor to be strongly positive at 95%, progesterone receptor positive at 30%, with an MIB-1 of 12%, and no HER-2 amplification, the signals ratio being 1.22 and the number per cell 2.20.  On 08/09/2015 the patient underwent biopsy of this suspicious left axillary lymph node noted above, and this showed ([id number]) metastatic carcinoma with extracapsular extension.  The patient's subsequent history is as detailed below    PAST MEDICAL HISTORY: Past Medical History:  Diagnosis Date  . Anxiety   . Asthma     seasonal asthma  . Breast cancer ([place])    metastatic breast cancer  . Complication of anesthesia 2007   aspiration pneumonia post hysterectomy.  Blood pressure would drop low- but no problems 01/2016- surgery  . History of blood transfusion   . History of mitral valve prolapse 1988   with palpitations  . History of radiation therapy 04/02/16- 05/21/16   Left Chest Wall, SCV, Axilla, IM nodes  . Hypertension    no meds  . Neuropathy    with chemo  . Osteopenia determined by x-ray   . Pneumonia    1980's and 1990's  . Raynaud's disease   . 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 Skin cancer 03/12/2014   basal- back    PAST SURGICAL HISTORY: Past Surgical History:  Procedure Laterality Date  . ABDOMINAL HYSTERECTOMY  2007   with BSO  . APPENDECTOMY  2007  . AXILLARY LYMPH NODE DISSECTION Left 02/14/2016   Procedure: LEFT AXILLARY LYMPH NODE DISSECTION;  Surgeon: [name], MD;  Location: [facility];  Service: General;  Laterality: Left;  . BREAST RECONSTRUCTION WITH PLACEMENT OF TISSUE EXPANDER AND FLEX HD (ACELLULAR HYDRATED DERMIS) Left 02/02/2016   Procedure: IMMEDIATE LEFT BREAST RECONSTRUCTION WITH PLACEMENT OF TISSUE EXPANDER AND FLEX HD (ACELLULAR HYDRATED DERMIS);  Surgeon: [name], DO;  Location: [facility];  Service: Plastics;  Laterality: Left;  . BREAST REDUCTION SURGERY Right 03/28/2017   Procedure: RIGHT MAMMARY REDUCTION  (BREAST);  Surgeon: [name], DO;  Location: [facility];  Service: Plastics;  Laterality: Right;  . BREAST SURGERY Left 02/02/16   mastectomy with reconstruction  . c-section  1991  . COLON RESECTION SIGMOID  12/02/2012   [facility]   . HERNIA REPAIR  2007  . LAPAROSCOPIC ENDOMETRIOSIS FULGURATION  1984, 1989  . MASTOPEXY Right 03/28/2017   Procedure: MASTOPEXY;  Surgeon: [name], DO;  Location: [facility];  Service: Plastics;  Laterality: Right;  . Port- a- cath placement  07/2015  . PORT-A-CATH REMOVAL Right 02/02/2016  Procedure: REMOVAL PORT-A-CATH;  Surgeon: [name], MD;  Location: [place];  Service: General;  Laterality: Right;  . RADIOACTIVE SEED GUIDED AXILLARY SENTINEL LYMPH NODE Left 02/02/2016   Procedure: LEFT BREAST MASTECTOMY AND RADIOACTIVE SEED GUIDED LEFT SENTINEL LYMPH NODE BIOPSY ;  Surgeon: [name], MD;  Location: [facility];  Service: General;  Laterality: Left;  . REMOVAL OF TISSUE EXPANDER AND PLACEMENT OF IMPLANT Left 03/28/2017   Procedure: REMOVAL OF  TISSUE EXPANDER AND PLACEMENT OF IMPLANT;  Surgeon: [name], DO;  Location: [facility];  Service: Plastics;  Laterality: Left;    FAMILY HISTORY Family History  Problem Relation Age of Onset  . Lung cancer Father    the patient's father died from lung cancer at the age of 79 in the setting of tobacco abuse. The patient's mother died at the age of 29 with pancreatic cancer. The patient had no siblings. There is no history of breast or ovarian cancer in the family.  GYNECOLOGIC HISTORY:  No LMP recorded. Patient has had a hysterectomy. Menarche age 69, first live birth age 58. The patient is GX P1. She is status post total hysterectomy with bilateral salpingo-oophorectomy. She has been on hormone replacement since that time and is tapering to off at the time of this dictation.   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 She used to work at [employer]. Her husband [name] works for [employer] division at a [employer] their son [name] is currently at home.    ADVANCED DIRECTIVES: Not in place   HEALTH MAINTENANCE: Social History   Tobacco Use  . Smoking status: Former Smoker    Packs/day: 1.00    Years: 10.00    Pack years: 10.00    Types: Cigarettes    Last attempt to quit: 11/25/2008    Years since quitting: 9.2  . Smokeless tobacco: Never Used  . Tobacco comment: on and off smoker never consistent  Substance Use Topics  . Alcohol use: Yes    Comment: social  . Drug use: No     Colonoscopy: April 2014/ Raynaldo Opitz [facility]  PAP: s/p hysterectomy  Bone density: August 2016/ osteopenia  Lipid panel:  Allergies  Allergen Reactions  . Sulfa Antibiotics Other (See Comments)    Blisters in mouth    Current Outpatient Medications  Medication Sig Dispense Refill  . amLODipine (NORVASC) 5 MG tablet Take 5 mg by mouth daily.    . Ascorbic Acid (VITAMIN C) 1000 MG tablet Take 1,000 mg by mouth 2 (two) times daily.    .  carisoprodol (SOMA) 350 MG tablet Take 350 mg by mouth 3 (three) times daily.    . cetirizine (ZYRTEC) 10 MG tablet Take 10 mg by mouth daily.    . Cholecalciferol (VITAMIN D3) 10000 units capsule Take 10,000 Units by mouth daily.     Marland Kitchen escitalopram (LEXAPRO) 20 MG tablet Take 1 tablet (20 mg total) by mouth daily. 90 tablet 4  . fluticasone (FLONASE) 50 MCG/ACT nasal spray Place 2 sprays into both nostrils daily. Reported on 11/18/2015    . gabapentin (NEURONTIN) 300 MG capsule Take 1 capsule (300 mg total) by mouth at bedtime as needed. Take 2 tablets in AM, 2 tablets mid-day, and 4 tablets at bedtime 90 capsule 4  . gabapentin (NEURONTIN) 300 MG capsule TAKE 2 CAPSULES IN THE MORNING AND TAKE 2 CAPSULES MID-DAY AND TAKE 4CAPSULES AT BEDTIME 240 capsule 4  .  LORazepam (ATIVAN) 0.5 MG tablet Take 1 tablet (0.5 mg total) by mouth at bedtime. 90 tablet 3  . Multiple Vitamin (MULTIVITAMIN) tablet Take 1 tablet by mouth daily.    . ondansetron (ZOFRAN) 8 MG tablet Take 1 tablet (8 mg total) by mouth every 8 (eight) hours as needed for nausea or vomiting. 20 tablet 5  . oxyCODONE (OXYCONTIN) 80 mg 12 hr tablet Take 2 tablets (160 mg total) by mouth every 8 (eight) hours. 120 tablet 0  . Oxycodone HCl 20 MG TABS Take 1-2 tablets (20-40 mg total) by mouth every 4 (four) hours as needed. 180 tablet 0  . Probiotic Product (PROBIOTIC DAILY PO) Take 1 capsule by mouth daily. [place] prochlorperazine (COMPAZINE) 10 MG tablet Take 1 tablet (10 mg total) by mouth every 6 (six) hours as needed (Nausea or vomiting). 90 tablet 2  . traZODone (DESYREL) 50 MG tablet TAKE 3 TABLETS AT BEDTIME 90 tablet 2  . UNABLE TO FIND [place]    . UNABLE TO FIND [place] mushroom    . UNABLE TO FIND [place] Grifolafrodre    . VERZENIO 100 MG tablet TAKE 1 TABLET BY MOUTH 2 TIMES DAILY FOR 2 WEEKS, THEN TAKE 2 WEEKS OFF 28 tablet 3  . zolpidem (AMBIEN) 5 MG tablet Take 1 tablet (5 mg total)  by mouth at bedtime as needed for sleep. for sleep 30 tablet 2   No current facility-administered medications for this visit.     OBJECTIVE: Middle-aged white woman who appears well  Vitals:   02/12/18 1405  BP: (!) 141/75  Pulse: (!) 113  Resp: 18  Temp: 99 F (37.2 C)  SpO2: 93%     Body mass index is 28.73 kg/m.    ECOG FS:0 - Asymptomatic   Sclerae unicteric, EOMs intact Oropharynx clear and moist No cervical or supraclavicular adenopathy Lungs no rales or rhonchi Heart regular rate and rhythm Abd soft, nontender, positive bowel sounds MSK no focal spinal tenderness, no upper extremity lymphedema Neuro: nonfocal, well oriented, appropriate affect Breasts: The right breast is unremarkable.  The left breast has undergone mastectomy followed by radiation.  There is no evidence of disease recurrence.  Both axillae are benign.   LAB RESULTS:  CMP     Component Value Date/Time   NA 138 01/15/2018 1409   NA 138 08/14/2017 1042   K 3.6 01/15/2018 1409   K 4.2 08/14/2017 1042   CL 102 01/15/2018 1409   CO2 26 01/15/2018 1409   CO2 26 08/14/2017 1042   GLUCOSE 107 01/15/2018 1409   GLUCOSE 129 08/14/2017 1042   BUN 10 01/15/2018 1409   BUN 8.3 08/14/2017 1042   CREATININE 0.74 01/15/2018 1409   CREATININE 0.8 08/14/2017 1042   CALCIUM 8.6 01/15/2018 1409   CALCIUM 9.6 08/14/2017 1042   PROT 7.2 01/15/2018 1409   PROT 6.5 08/14/2017 1042   ALBUMIN 3.7 01/15/2018 1409   ALBUMIN 3.4 (L) 08/14/2017 1042   AST 35 (H) 01/15/2018 1409   AST 63 (H) 08/14/2017 1042   ALT 26 01/15/2018 1409   ALT 53 08/14/2017 1042   ALKPHOS 113 01/15/2018 1409   ALKPHOS 84 08/14/2017 1042   BILITOT 0.4 01/15/2018 1409   BILITOT 0.44 08/14/2017 1042   GFRNONAA >60 01/15/2018 1409   GFRAA >60 01/15/2018 1409    INo results found for: SPEP, UPEP  Lab Results  Component Value Date   WBC 2.8 (L)  02/12/2018   NEUTROABS 1.6 02/12/2018   HGB 11.9 02/12/2018   HCT 35.4 02/12/2018    MCV 99.7 02/12/2018   PLT 153 02/12/2018      Chemistry      Component Value Date/Time   NA 138 01/15/2018 1409   NA 138 08/14/2017 1042   K 3.6 01/15/2018 1409   K 4.2 08/14/2017 1042   CL 102 01/15/2018 1409   CO2 26 01/15/2018 1409   CO2 26 08/14/2017 1042   BUN 10 01/15/2018 1409   BUN 8.3 08/14/2017 1042   CREATININE 0.74 01/15/2018 1409   CREATININE 0.8 08/14/2017 1042      Component Value Date/Time   CALCIUM 8.6 01/15/2018 1409   CALCIUM 9.6 08/14/2017 1042   ALKPHOS 113 01/15/2018 1409   ALKPHOS 84 08/14/2017 1042   AST 35 (H) 01/15/2018 1409   AST 63 (H) 08/14/2017 1042   ALT 26 01/15/2018 1409   ALT 53 08/14/2017 1042   BILITOT 0.4 01/15/2018 1409   BILITOT 0.44 08/14/2017 1042       No results found for: LABCA2  No components found for: LABCA125  No results for input(s): INR in the last 168 hours.  Urinalysis    Component Value Date/Time   COLORURINE YELLOW 11/12/2015 [place] 11/12/2015 0837   LABSPEC 1.005 03/18/2017 1223   PHURINE 6.0 03/18/2017 1223   PHURINE 6.5 11/12/2015 0837   GLUCOSEU Negative 03/18/2017 1223   HGBUR Large 03/18/2017 1223   HGBUR MODERATE (A) 11/12/2015 0837   BILIRUBINUR Negative 03/18/2017 1223   KETONESUR Negative 03/18/2017 [place] 11/12/2015 0837   PROTEINUR Negative 03/18/2017 1223   PROTEINUR NEGATIVE 11/12/2015 0837   UROBILINOGEN 0.2 03/18/2017 1223   NITRITE Negative 03/18/2017 1223   NITRITE NEGATIVE 11/12/2015 0837   LEUKOCYTESUR Moderate 03/18/2017 1223    STUDIES: Nm Pet Image Restag (ps) Skull Base To Thigh  Result Date: 02/03/2018 CLINICAL DATA:  Subsequent treatment strategy for left-sided breast cancer with bone metastases. EXAM: NUCLEAR MEDICINE PET SKULL BASE TO THIGH TECHNIQUE: 7.5 mCi F-18 FDG was injected intravenously. Full-ring PET imaging was performed from the skull base to thigh after the radiotracer. CT data was obtained and used for attenuation  correction and anatomic localization. Fasting blood glucose: 128 mg/dl COMPARISON:  09/05/2016 FINDINGS: Mediastinal blood pool activity: SUV max 2.9 NECK: No hypermetabolic lymph nodes in the neck. Incidental CT findings: none CHEST: No hypermetabolic mediastinal or hilar nodes. No suspicious pulmonary nodules on the CT scan. Linear scarring in the left upper lobe is stable as is the associated low level FDG accumulation. Incidental CT findings: Dependent atelectasis in the lower lobes. ABDOMEN/PELVIS: No abnormal hypermetabolic activity within the liver, pancreas, adrenal glands, or spleen. No hypermetabolic lymph nodes in the abdomen or pelvis. Incidental CT findings: There is abdominal aortic atherosclerosis without aneurysm. SKELETON: Multiple hypermetabolic bone metastases again noted. Most of these lesions appear stable although new and progressive bone metastases evident. Posterior right iliac bone lesion with SUV max = 3.8 (PET image 147) is new in the interval. Metastatic disease in right iliac crest has progressed while hypermetabolic metastasis in the left iliac crest is stable. While CT imaging shows similar appearance of metastatic disease in the L2 vertebral body, PET images show a new focus of hypermetabolism with SUV max = 5.8. Incidental CT findings: none IMPRESSION: Largely stable hypermetabolic bone metastases although some new areas of bony hypermetabolism are evident on today's study. No evidence for soft tissue  or visceral hypermetabolic metastatic involvement on today's exam. Aortic Atherosclerois (ICD10-170.0) Electronically Signed   By: Misty Stanley M.D.   On: 02/03/2018 13:21     PROTOCOLS:  participated in [place], [facility] and PALLAS studies, currently off study   ASSESSMENT: 57 y.o. Alison [name] woman status post biopsy of 2 separate masses in the left breast upper outer quadrant 08/04/2015, clinically mT2 N1, stage IIb/IIIa invasive ductal carcinoma, grade 3, the larger mass being  estrogen and progesterone receptor positive, HER-2 negative, with an MIB-1 of 12%  (a) biopsy of a left axillary lymph node 08/09/2015 was positive, with extracapsular extension  (1) neoadjuvant chemotherapy started 08/25/2015 consisting of doxorubicin and cyclophosphamide in dose dense fashion 4, completed 10/06/2015,  followed by weekly paclitaxel 10 completed 12/23/2015  (a) final 2 planned cycles of weekly paclitaxel omitted because of neuropathy concerns  (2) status post left mastectomy with targeted axillary dissection 02/02/2016 for a ypT2 ypN2a, stage IIIa invasive ductal carcinoma, grade 2, with negative margins, repeat prognostic panel showing estrogen receptor positive, but progesterone receptor and HER-2 negative  (a) completion axillary lymph node dissection 02/14/2016 showed an additional 2 out of 8 lymph nodes to be involved (final count 6 out of 10 lymph nodes positive  (b) expander placement at the time of left mastectomy  (3) adjuvant capecitabine starting concurrently with radiation--discontinued 05/21/2016  (4) adjuvant  radiation started 04/02/2016, completed 05/21/2016  (5) anastrozole started October 2017, discontinued January 2019  (a) bone density August 2016 showed osteopenia  (b) repeat bone density 09/07/2016 shows a T score of -1.7.  (c) the patient is status post total abdominal hysterectomy with bilateral salpingo-oophorectomy.  (6) chronic pain: Starting OxyContin 20 mg twice a day with oxycodone as needed for breakthrough pain 03/02/2016  (a) failed transition to tramadol, gabapentin, naproxen   (b) started methadone 5 mg TID as of 12/20/2016-- not tolerated  (c) on OxyContin and Oxycodone as discussed below  (7) Research:  (a) B-WEL study ([facility] [id number])--enrolled in [place] (received education)  (b) PALLAS AFT-05 study: randomized to treatment, started palbociclib 07/26/2016   (i) dose reduced to 100 mg, then to 75 mg because of cytopenias   (ii) went  off study study May 2018 because of persistent cytopenias  (c) taxane study 12/20/2016  (d) aspirin study  (e) went off studies as metastatic disease developed in June 2018  METASTATIC DISEASE 02/15/2017: CT scan of the lumbar spine and 02/27/2017 CT of the thoracic and cervical spine shows multiple bone lesions but no evidence of cord compromise; bone lesions are confirmed on PET scan 02/22/2017 which however shows no evidence of visceral disease  (a) CA-27-29 is noninformative  (b) left upper lobe changes noted on PET scan 02/22/2017 felt to be secondary to radiation  (c)repeat PET scan 09/05/2017 shows again no visceral disease, stable bony metastases  (d) PET scan 02/03/2018 shows no visceral disease, few new bone lesions  (8) continued anastrozole, abemaciclib added 02/22/2017, taken 2 weeks on, 2 weeks off   (a) fulvestrant started 02/22/2017 ,   (b) anastrozole discontinued January 2019  (c) fulvestrant and abemaciclib discontinued June 2019 with progression  (9) yearly zolendronate given 09/26/2016, changed to monthly denosumab/Xgeva starting 03/19/2017  (10) radiation 04/01/2017 - 04/23/2017 Site/dose:   The lumbar spine was treated to 35 Gy in 14 fractions of 2.5 Gy.   (11) chronic pain: Currently on OxyContin 160 grams p.o. TID/ oxycodone 20-40 mg Q 4h PRN  (i) bowel prophylaxis in place  (ii) also followed  by Dr. Clydell Hakim   (12) exemestane started 02/12/2018  (a) everolimus to be added 03/12/2018   PLAN: Alison [name] is showing some tumor progression on her most recent PET scan.  The good news is that we do not see visceral disease.  We discussed the scan in great detail today as well as her options.  We do have at least one more good option on the antiestrogen side.  She is going to try exemestane and everolimus.  We went over the possible toxicity side effects and complications of these agents.  She will start the exemestane as soon as she can obtain it.  We are going to  wait on the everolimus until she comes back from her beach trip which will be mid July  We are continuing the denosumab/Xgeva as before.  We can always consider switching back to zolendronate.  I have specifically warned her to let us know if she develops a persistent dry cough  As far as her pain is concerned she is doing very well on a stable if high dose of narcotics.  She is taking 860 mg of OxyContin 3 times a day and oxycodone 20 to 79m up to 5-6 tablets a day.  I refilled her prescriptions today  She has a good bowel prophylaxis in place although she does not tolerate MiraLAX  After 3 months or 4 months on the current medications we will repeat a PET scan.  If there is further evidence of disease progression we will have to go to chemotherapy.  LElainnahas a good understanding of the overall plan.  She agrees with it.  She will call with any other issues that may develop before the next visit.  Magrinat, GVirgie Dad MD  02/12/18 2:32 PM Medical Oncology and Hematology [facility]ACary [place] [address]Tel. [phone]   Fax. [phone] IAlice Rieger am acting as scribe for GChauncey CruelMD.  I, GLurline DelMD, have reviewed the above documentation for accuracy and completeness, and I agree with the above.

## 2018-02-12 ENCOUNTER — Inpatient Hospital Stay: Payer: 59 | Attending: Oncology

## 2018-02-12 ENCOUNTER — Encounter: Payer: 59 | Admitting: Physical Therapy

## 2018-02-12 ENCOUNTER — Inpatient Hospital Stay: Payer: 59

## 2018-02-12 ENCOUNTER — Ambulatory Visit: Payer: 59

## 2018-02-12 ENCOUNTER — Inpatient Hospital Stay (HOSPITAL_BASED_OUTPATIENT_CLINIC_OR_DEPARTMENT_OTHER): Payer: 59 | Admitting: Oncology

## 2018-02-12 VITALS — BP 141/75 | HR 113 | Temp 99.0°F | Resp 18 | Ht 62.0 in | Wt 157.1 lb

## 2018-02-12 DIAGNOSIS — G893 Neoplasm related pain (acute) (chronic): Secondary | ICD-10-CM

## 2018-02-12 DIAGNOSIS — M8580 Other specified disorders of bone density and structure, unspecified site: Secondary | ICD-10-CM | POA: Diagnosis not present

## 2018-02-12 DIAGNOSIS — C779 Secondary and unspecified malignant neoplasm of lymph node, unspecified: Secondary | ICD-10-CM

## 2018-02-12 DIAGNOSIS — Z170 Estrogen receptor positive status [ER+]: Secondary | ICD-10-CM | POA: Diagnosis not present

## 2018-02-12 DIAGNOSIS — C50412 Malignant neoplasm of upper-outer quadrant of left female breast: Secondary | ICD-10-CM | POA: Diagnosis not present

## 2018-02-12 DIAGNOSIS — Z87891 Personal history of nicotine dependence: Secondary | ICD-10-CM | POA: Insufficient documentation

## 2018-02-12 DIAGNOSIS — Z79899 Other long term (current) drug therapy: Secondary | ICD-10-CM | POA: Diagnosis not present

## 2018-02-12 DIAGNOSIS — T451X5A Adverse effect of antineoplastic and immunosuppressive drugs, initial encounter: Secondary | ICD-10-CM

## 2018-02-12 DIAGNOSIS — Z79811 Long term (current) use of aromatase inhibitors: Secondary | ICD-10-CM | POA: Diagnosis not present

## 2018-02-12 DIAGNOSIS — Z923 Personal history of irradiation: Secondary | ICD-10-CM

## 2018-02-12 DIAGNOSIS — C50912 Malignant neoplasm of unspecified site of left female breast: Secondary | ICD-10-CM

## 2018-02-12 DIAGNOSIS — C7951 Secondary malignant neoplasm of bone: Secondary | ICD-10-CM | POA: Insufficient documentation

## 2018-02-12 DIAGNOSIS — G620 Drug-induced polyneuropathy: Secondary | ICD-10-CM

## 2018-02-12 DIAGNOSIS — Z5111 Encounter for antineoplastic chemotherapy: Secondary | ICD-10-CM | POA: Diagnosis present

## 2018-02-12 DIAGNOSIS — Z801 Family history of malignant neoplasm of trachea, bronchus and lung: Secondary | ICD-10-CM

## 2018-02-12 LAB — COMPREHENSIVE METABOLIC PANEL
ALBUMIN: 3.8 g/dL (ref 3.5–5.0)
ALT: 20 U/L (ref 0–55)
ANION GAP: 10 (ref 3–11)
AST: 33 U/L (ref 5–34)
Alkaline Phosphatase: 95 U/L (ref 40–150)
BUN: 9 mg/dL (ref 7–26)
CHLORIDE: 97 mmol/L — AB (ref 98–109)
CO2: 32 mmol/L — ABNORMAL HIGH (ref 22–29)
Calcium: 9.6 mg/dL (ref 8.4–10.4)
Creatinine, Ser: 0.78 mg/dL (ref 0.60–1.10)
GFR calc Af Amer: >60 mL/min (ref >60)
Glucose, Bld: 133 mg/dL (ref 70–140)
POTASSIUM: 3.5 mmol/L (ref 3.5–5.1)
Sodium: 139 mmol/L (ref 136–145)
TOTAL PROTEIN: 7 g/dL (ref 6.4–8.3)
Total Bilirubin: 0.4 mg/dL (ref 0.2–1.2)

## 2018-02-12 LAB — CBC WITH DIFFERENTIAL/PLATELET
BASOS ABS: 0.1 10*3/uL (ref 0.0–0.1)
BASOS PCT: 2 %
EOS PCT: 5 %
Eosinophils Absolute: 0.1 10*3/uL (ref 0.0–0.5)
HCT: 35.4 % (ref 34.8–46.6)
Hemoglobin: 11.9 g/dL (ref 11.6–15.9)
Lymphocytes Relative: 25 %
Lymphs Abs: 0.7 10*3/uL — ABNORMAL LOW (ref 0.9–3.3)
MCH: 33.5 pg (ref 25.1–34.0)
MCHC: 33.6 g/dL (ref 31.5–36.0)
MCV: 99.7 fL (ref 79.5–101.0)
MONO ABS: 0.3 10*3/uL (ref 0.1–0.9)
MONOS PCT: 11 %
Neutro Abs: 1.6 10*3/uL (ref 1.5–6.5)
Neutrophils Relative %: 57 %
PLATELETS: 153 10*3/uL (ref 145–400)
RBC: 3.55 MIL/uL — ABNORMAL LOW (ref 3.70–5.45)
RDW: 14.2 % (ref 11.2–14.5)
WBC: 2.8 10*3/uL — ABNORMAL LOW (ref 3.9–10.3)

## 2018-02-12 MED ORDER — DENOSUMAB 120 MG/1.7ML ~~LOC~~ SOLN
SUBCUTANEOUS | Status: AC
  Filled 2018-02-12: qty 1.7

## 2018-02-12 MED ORDER — OXYCODONE HCL ER 80 MG PO T12A: 160.0000 mg | EXTENDED_RELEASE_TABLET | Freq: Three times a day (TID) | ORAL | 0 refills | Status: DC

## 2018-02-12 MED ORDER — DENOSUMAB 120 MG/1.7ML ~~LOC~~ SOLN
120.0000 mg | Freq: Once | SUBCUTANEOUS | Status: AC
  Administered 2018-02-12: 120 mg via SUBCUTANEOUS

## 2018-02-12 MED ORDER — EXEMESTANE 25 MG PO TABS
25.0000 mg | ORAL_TABLET | Freq: Every day | ORAL | 4 refills | Status: AC
Start: 2018-02-12 — End: ?

## 2018-02-12 MED ORDER — EVEROLIMUS 10 MG PO TABS: 10.0000 mg | ORAL_TABLET | Freq: Every day | ORAL | 6 refills | Status: DC

## 2018-02-12 MED ORDER — GABAPENTIN 300 MG PO CAPS: 1200.0000 mg | ORAL_CAPSULE | Freq: Every day | ORAL | 4 refills | Status: AC

## 2018-02-12 MED ORDER — OXYCODONE HCL 20 MG PO TABS: 20.0000 mg | ORAL_TABLET | ORAL | 0 refills | Status: DC | PRN

## 2018-02-12 MED ORDER — FULVESTRANT 250 MG/5ML IM SOLN
INTRAMUSCULAR | Status: AC
  Filled 2018-02-12: qty 5

## 2018-02-12 MED FILL — EXEMESTANE 25 MG TABLET: 25 | 90 days supply | Qty: 90 | Fill #0

## 2018-02-13 ENCOUNTER — Encounter: Payer: Self-pay | Admitting: Physical Therapy

## 2018-02-13 ENCOUNTER — Ambulatory Visit: Payer: 59 | Admitting: Physical Therapy

## 2018-02-13 DIAGNOSIS — L599 Disorder of the skin and subcutaneous tissue related to radiation, unspecified: Secondary | ICD-10-CM

## 2018-02-13 DIAGNOSIS — R2681 Unsteadiness on feet: Secondary | ICD-10-CM

## 2018-02-13 DIAGNOSIS — R221 Localized swelling, mass and lump, neck: Secondary | ICD-10-CM

## 2018-02-13 DIAGNOSIS — M5440 Lumbago with sciatica, unspecified side: Secondary | ICD-10-CM

## 2018-02-13 DIAGNOSIS — G8929 Other chronic pain: Secondary | ICD-10-CM

## 2018-02-13 DIAGNOSIS — I890 Lymphedema, not elsewhere classified: Secondary | ICD-10-CM

## 2018-02-13 DIAGNOSIS — M25512 Pain in left shoulder: Secondary | ICD-10-CM

## 2018-02-13 DIAGNOSIS — R293 Abnormal posture: Secondary | ICD-10-CM

## 2018-02-13 NOTE — Therapy (Signed)
[place], [place], [address] Phone: [phone]   Fax:  [phone]  Physical Therapy Treatment  Patient Details  Name: Alison [name] MRN: [id number] Date of Birth: 11/01/1960 Referring Provider: Dr. [name]    Encounter Date: 02/13/2018  PT End of Session - 02/13/18 1659    Visit Number  22    Number of Visits  42    Date for PT Re-Evaluation  03/15/18    PT Start Time  1430    PT Stop Time  1515    PT Time Calculation (min)  45 min    Activity Tolerance  Patient tolerated treatment well    Behavior During Therapy  [facility] for tasks assessed/performed       Past Medical History:  Diagnosis Date  . Anxiety   . Asthma     seasonal asthma  . Breast cancer ([place])    metastatic breast cancer  . Complication of anesthesia 2007   aspiration pneumonia post hysterectomy.  Blood pressure would drop low- but no problems 01/2016- surgery  . History of blood transfusion   . History of mitral valve prolapse 1988   with palpitations  . History of radiation therapy 04/02/16- 05/21/16   Left Chest Wall, SCV, Axilla, IM nodes  . Hypertension    no meds  . Neuropathy    with chemo  . Osteopenia determined by x-ray   . Pneumonia    1980's and 1990's  . Raynaud's disease   . 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 Skin cancer 03/12/2014   basal- back    Past Surgical History:  Procedure Laterality Date  . ABDOMINAL HYSTERECTOMY  2007   with BSO  . APPENDECTOMY  2007  . AXILLARY LYMPH NODE DISSECTION Left 02/14/2016   Procedure: LEFT AXILLARY LYMPH NODE DISSECTION;  Surgeon: [name], MD;  Location: [place];  Service: General;  Laterality: Left;  . BREAST RECONSTRUCTION WITH PLACEMENT OF TISSUE EXPANDER AND FLEX HD (ACELLULAR HYDRATED DERMIS) Left 02/02/2016   Procedure: IMMEDIATE LEFT BREAST RECONSTRUCTION WITH PLACEMENT OF TISSUE EXPANDER AND FLEX HD (ACELLULAR HYDRATED  DERMIS);  Surgeon: [name], DO;  Location: [place];  Service: Plastics;  Laterality: Left;  . BREAST REDUCTION SURGERY Right 03/28/2017   Procedure: RIGHT MAMMARY REDUCTION  (BREAST);  Surgeon: [name], DO;  Location: [place];  Service: Plastics;  Laterality: Right;  . BREAST SURGERY Left 02/02/16   mastectomy with reconstruction  . c-section  1991  . COLON RESECTION SIGMOID  12/02/2012   [facility]   . HERNIA REPAIR  2007  . LAPAROSCOPIC ENDOMETRIOSIS FULGURATION  1984, 1989  . MASTOPEXY Right 03/28/2017   Procedure: MASTOPEXY;  Surgeon: [name], DO;  Location: [place];  Service: Plastics;  Laterality: Right;  . Port- a- cath placement  07/2015  . PORT-A-CATH REMOVAL Right 02/02/2016   Procedure: REMOVAL PORT-A-CATH;  Surgeon: [name], MD;  Location: [place];  Service: General;  Laterality: Right;  . RADIOACTIVE SEED GUIDED AXILLARY SENTINEL LYMPH NODE Left 02/02/2016   Procedure: LEFT BREAST MASTECTOMY AND RADIOACTIVE SEED GUIDED LEFT SENTINEL LYMPH NODE BIOPSY ;  Surgeon: [name], MD;  Location: [place];  Service: General;  Laterality: Left;  . REMOVAL OF TISSUE EXPANDER AND PLACEMENT OF IMPLANT Left 03/28/2017   Procedure: REMOVAL OF TISSUE EXPANDER AND PLACEMENT OF IMPLANT;  Surgeon: [name], DO;  Location: [place];  Service: Plastics;  Laterality: Left;    There were no vitals filed for this visit.  Subjective Assessment - 02/13/18 1651    Subjective  Alison [name] report that she had a visit with Dr. [name] yesterday to review her recent PET scan.  She has had new areas of bone metastasis especially in the right iliac bone and L2 vertebrae   she will be changing her medications  She reports she has lots of fullness in her left axilla today     Pertinent History  Breast cancer diagnosed November 2016 She had chemotherapy. but did have some  neuropathy so it had to be stopped. On June 8, she had a mastectomy with immedicate expander placement and with first set of lymphnodes removed, On June 20 she went for complete axillary lymph node dissection. She says she has had always been "full necked" but has neck and upper body fullness since the cancer was diagnosed. She reports she has osteopenia in low back and has had a fracture that was discovered in 2014 when she had a Dexa scan. Pt has had metastasis to the bones of the spine and recent  bilateral breast surgery for a reduction on the right and expander removal on the left with implant placement and removal  02/13/2018: progression of bone mets on PET scan     Patient Stated Goals   to help manage pain and swelling in axilla ,prevent falls and continue with current quality of life     Currently in Pain?  Yes    Pain Score  -- did not rate, pt states she had lots of pain this morning  she has been taking her pain meds     Pain Location  Back    Pain Orientation  Mid         [facility] PT Assessment - 02/13/18 0001      Assessment   Medical Diagnosis  breast cancer     Referring Provider  Dr. [name]     Onset Date/Surgical Date  07/13/15      Prior Function   Level of Independence  Independent                   [facility] Adult PT Treatment/Exercise - 02/13/18 0001      Self-Care   Other Self-Care Comments   reviewed reachers and long handled shoe horn to help with ADLs while protecting her back       Manual Therapy   Myofascial Release  to left chest     Manual Lymphatic Drainage (MLD)  short neck, anterior interaxillary anastamosis, left axilla and lateral chest, then to sidelying for posterior back       Kinesiotix   Edema  skinkote and kinsesiotap in fan shape along left lateral trunk and axilla                 PT Short Term Goals - 02/13/18 1706      PT SHORT TERM GOAL #1   Title  Pt will report that she has had 25% improvement in pain in left chest  and axilla so that she can perform her kitchen tasks easier.     Status  Achieved      PT SHORT TERM GOAL #2   Title  Pt will report she is doing her arm range of motion exercises 5 times a week     Status  Achieved      PT SHORT TERM  GOAL #3   Title  Vestibular goal: pt will participate in further assessment of gait with FGA and gait velocity    Status  Deferred      PT SHORT TERM GOAL #4   Title  Vestibular goal: pt will participate in motion sensitivity and SOT testing with habituation HEP to be initiated    Status  Deferred      Short Term Clinic Goals - 08/28/17 1118      CC Short Term Goal  #1   Title  Pt will report she knows strategies for good body mechanics to decrease low back pain     Status  Achieved      CC Short Term Goal  #2   Title  Pt will have a deacrease in circumference 15 cm prox to ulnar styloid of left forearm by 1 cm     Baseline  25 cm , on 05/16/2017 it is 23.5    Status  Achieved      CC Short Term Goal  #3   Title  Pt will report she knows how to manage left UE lymphedema with use of compression, elevation and exercise, self manual lymph drainage and possibly Flexitouch     Baseline  Pt has day and nighttime garments and Flexitouch     Status  Achieved       PT Long Term Goals - 02/13/18 1707      PT LONG TERM GOAL #1   Title  Pt will have decrease in left forearm circumference at 10 cm proximal to ulnar styolid to 20 cm     Baseline  22 cm on 01/03/2018     Status  On-going      PT LONG TERM GOAL #2   Title  Pt will have 150 degrees of shoulder flexion in supine indicating so that she will have greater ease in reaching activites in her daily life     Baseline  varies as pt has decreased ROM when she has increased pain and swelling     Status  On-going      PT LONG TERM GOAL #3   Title  Pt will report she is participating in general exercise program of her choosing  3 times per week     Status  On-going      PT LONG TERM GOAL #4   Title   Pt will report >/= 10 point improvement in overall function (as reported on FOTO) by D/C    Status  Deferred      PT LONG TERM GOAL #5   Title  Pt will report decrease in symptom severity to </= 1/5 on MSQ    Status  Deferred      PT LONG TERM GOAL #6   Title  Pt will improve FGA by 8 points to decrease falls risk    Status  Deferred      PT LONG TERM GOAL #7   Title  Pt will improve gait velocity to >3.6 ft/sec     Status  Deferred      PT LONG TERM GOAL #8   Title  Pt will improve SOT composite score by 10 points    Status  Deferred        Long Term Clinic Goals - 08/28/17 1119      CC Long Term Goal  #1   Title  Patient will report  that she can manage back pain  so she can perform daily activities with greater  ease    Status  Achieved      CC Long Term Goal  #2   Title  Pt will  have given the TENS unit a trial to see if it will help with back pain     Baseline  has not needed to use TENS     Status  Deferred      CC Long Term Goal  #3   Title  Pt will be independent in core and  LE strengthening exercises       CC Long Term Goal  #4   Title  Pt will report low back pain is decreased to a 4/10 so that she can perform activities of daily living easier     Baseline  It depends on the day , but pt needs to take breaks and knows how to manage her pain, she feels this goal is achieved     Status  Achieved         Plan - 02/13/18 1700    Clinical Impression Statement  Alison [name] reports her PET scan which shows progression of bone mets.  Reinforced need for core stabalization , strengthening and balance exercise and encouraged her to continue wiht Tai Chi and walking.  Discussed again body mechanics and assitive devices to help her avoid bending positions.  She will be getting a refresher from [facility] for her arm lymphedema.  She did not have as good relief from MLD today so kinesiotape was applied  Pt will need to continue PT for strengthening and symptom managment to  maintain her present level of quality of life.     Clinical Impairments Affecting Rehab Potential  mulitple areas of diffuse bony metastasis, chemotherapy with neuropathy , radiation to back  and chest,    PT Frequency  2x / week    PT Duration  8 weeks    PT Treatment/Interventions  ADLs/Self Care Home Management;Manual lymph drainage;Compression bandaging;Scar mobilization;Passive range of motion;Patient/family education;Taping;Manual techniques;Therapeutic exercise;Therapeutic activities;Orthotic Fit/Training;DME Instruction;Electrical Stimulation;Moist Heat;Gait training;Stair training;Functional mobility training;Balance training;Neuromuscular re-education;Vestibular    PT Next Visit Plan   Core and LE exercise in supine and standing.consider treadmill and balance work.  Manual lymph drainage to left UE and back with myofascial release work to left chest Progress exercise and continue encouragement of community exercise     Consulted and Agree with Plan of Care  Patient       Patient will benefit from skilled therapeutic intervention in order to improve the following deficits and impairments:  Decreased range of motion, Impaired UE functional use, Decreased activity tolerance, Decreased knowledge of precautions, Decreased skin integrity, Impaired perceived functional ability, Pain, Decreased knowledge of use of DME, Increased edema, Postural dysfunction, Decreased strength, Impaired sensation, Decreased scar mobility, Decreased mobility, Increased muscle spasms, Dizziness, Increased fascial restricitons, Decreased balance, Difficulty walking  Visit Diagnosis: Disorder of the skin and subcutaneous tissue related to radiation, unspecified  Abnormal posture  Chronic left shoulder pain  Low back pain with sciatica, sciatica laterality unspecified, unspecified back pain laterality, unspecified chronicity  Localized swelling, mass and lump, neck  Lymphedema, not elsewhere  classified  Unsteadiness on feet     Problem List Patient Active Problem List   Diagnosis Date Noted  . Pain from bone metastases ([place]) 09/11/2017  . Counseling regarding goals of care 04/16/2017  . Bone metastases ([place]) 02/28/2017  . Uncontrolled pain 02/28/2017  . Osteopenia determined by x-ray 09/20/2016  . Primary malignant neoplasm of left breast with stage 2 nodal metastasis  per [employer] on Cancer 7th edition (N2) ([place]) 02/14/2016  . Chemotherapy-induced neuropathy ([place]) 12/30/2015  . Insomnia 09/01/2015  . Malignant neoplasm of upper-outer quadrant of left breast in female, estrogen receptor positive ([place]) 08/08/2015   Donato Heinz. Owens Shark PT  Alison [name] 02/13/2018, 5:10 PM  [place], [place], [address] Phone: [phone]   Fax:  [phone]  Name: Alison [name] MRN: [id number] Date of Birth: [birth year]

## 2018-02-14 ENCOUNTER — Telehealth: Payer: Self-pay | Admitting: Oncology

## 2018-02-14 ENCOUNTER — Encounter: Payer: Self-pay | Admitting: Oncology

## 2018-02-14 NOTE — Telephone Encounter (Signed)
Tried to call regarding 7/24

## 2018-02-18 ENCOUNTER — Ambulatory Visit: Payer: 59

## 2018-02-18 DIAGNOSIS — G8929 Other chronic pain: Secondary | ICD-10-CM

## 2018-02-18 DIAGNOSIS — L599 Disorder of the skin and subcutaneous tissue related to radiation, unspecified: Secondary | ICD-10-CM

## 2018-02-18 DIAGNOSIS — I890 Lymphedema, not elsewhere classified: Secondary | ICD-10-CM

## 2018-02-18 DIAGNOSIS — R293 Abnormal posture: Secondary | ICD-10-CM

## 2018-02-18 DIAGNOSIS — M25512 Pain in left shoulder: Secondary | ICD-10-CM

## 2018-02-18 NOTE — Therapy (Signed)
[place], [place], [address] Phone: [phone]   Fax:  [phone]  Physical Therapy Treatment  Patient Details  Name: Alison [name] MRN: [id number] Date of Birth: [birth year] Referring Provider: Dr. [name]    Encounter Date: 02/18/2018  PT End of Session - 02/18/18 1708    Visit Number  23    Number of Visits  42    Date for PT Re-Evaluation  03/15/18    PT Start Time  2620    PT Stop Time  1657    PT Time Calculation (min)  50 min    Activity Tolerance  Patient tolerated treatment well    Behavior During Therapy  [facility] for tasks assessed/performed       Past Medical History:  Diagnosis Date  . Anxiety   . Asthma     seasonal asthma  . Breast cancer ([place])    metastatic breast cancer  . Complication of anesthesia 2007   aspiration pneumonia post hysterectomy.  Blood pressure would drop low- but no problems 01/2016- surgery  . History of blood transfusion   . History of mitral valve prolapse 1988   with palpitations  . History of radiation therapy 04/02/16- 05/21/16   Left Chest Wall, SCV, Axilla, IM nodes  . Hypertension    no meds  . Neuropathy    with chemo  . Osteopenia determined by x-ray   . Pneumonia    1980's and 1990's  . Raynaud's disease   . 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 Skin cancer 03/12/2014   basal- back    Past Surgical History:  Procedure Laterality Date  . ABDOMINAL HYSTERECTOMY  2007   with BSO  . APPENDECTOMY  2007  . AXILLARY LYMPH NODE DISSECTION Left 02/14/2016   Procedure: LEFT AXILLARY LYMPH NODE DISSECTION;  Surgeon: [name], MD;  Location: [place];  Service: General;  Laterality: Left;  . BREAST RECONSTRUCTION WITH PLACEMENT OF TISSUE EXPANDER AND FLEX HD (ACELLULAR HYDRATED DERMIS) Left 02/02/2016   Procedure: IMMEDIATE LEFT BREAST RECONSTRUCTION WITH PLACEMENT OF TISSUE EXPANDER AND FLEX HD (ACELLULAR HYDRATED  DERMIS);  Surgeon: [name], DO;  Location: [place];  Service: Plastics;  Laterality: Left;  . BREAST REDUCTION SURGERY Right 03/28/2017   Procedure: RIGHT MAMMARY REDUCTION  (BREAST);  Surgeon: [name], DO;  Location: [place];  Service: Plastics;  Laterality: Right;  . BREAST SURGERY Left 02/02/16   mastectomy with reconstruction  . c-section  1991  . COLON RESECTION SIGMOID  12/02/2012   [facility]   . HERNIA REPAIR  2007  . LAPAROSCOPIC ENDOMETRIOSIS FULGURATION  1984, 1989  . MASTOPEXY Right 03/28/2017   Procedure: MASTOPEXY;  Surgeon: [name], DO;  Location: [place];  Service: Plastics;  Laterality: Right;  . Port- a- cath placement  07/2015  . PORT-A-CATH REMOVAL Right 02/02/2016   Procedure: REMOVAL PORT-A-CATH;  Surgeon: [name], MD;  Location: [place];  Service: General;  Laterality: Right;  . RADIOACTIVE SEED GUIDED AXILLARY SENTINEL LYMPH NODE Left 02/02/2016   Procedure: LEFT BREAST MASTECTOMY AND RADIOACTIVE SEED GUIDED LEFT SENTINEL LYMPH NODE BIOPSY ;  Surgeon: [name], MD;  Location: [place];  Service: General;  Laterality: Left;  . REMOVAL OF TISSUE EXPANDER AND PLACEMENT OF IMPLANT Left 03/28/2017   Procedure: REMOVAL OF TISSUE EXPANDER AND PLACEMENT OF IMPLANT;  Surgeon: [name], DO;  Location: [place];  Service: Plastics;  Laterality: Left;    There were no vitals filed for this visit.  Subjective Assessment - 02/18/18 1611    Subjective  Because of my recent PEt scan my meds have changed some but I'll have to bring them to my next appt bc I can't remember. I want to focus on the manaul lymph drainage today, I have some tnederness at my Lt axilla.    Pertinent History  Breast cancer diagnosed November 2016 She had chemotherapy. but did have some neuropathy so it had to be stopped. On June 8, she had a mastectomy with  immedicate expander placement and with first set of lymphnodes removed, On June 20 she went for complete axillary lymph node dissection. She says she has had always been "full necked" but has neck and upper body fullness since the cancer was diagnosed. She reports she has osteopenia in low back and has had a fracture that was discovered in 2014 when she had a Dexa scan. Pt has had metastasis to the bones of the spine and recent  bilateral breast surgery for a reduction on the right and expander removal on the left with implant placement and removal  02/13/2018: progression of bone mets on PET scan     Patient Stated Goals   to help manage pain and swelling in axilla ,prevent falls and continue with current quality of life     Currently in Pain?  No/denies                       [facility] Adult PT Treatment/Exercise - 02/18/18 0001      Manual Therapy   Myofascial Release  to left chest very gently to pts tolerance    Manual Lymphatic Drainage (MLD)  Short neck, superficial and deep abdominals, Rt axillary nodes, anterior interaxillary anastamosis, left axilla and lateral chest, then to sidelying for posterior back working along posterior inter-axillary anastomosis.    Passive ROM  Gentle P/ROM to Lt shoulder as tolerated into flexion and abduction               PT Short Term Goals - 02/13/18 1706      PT SHORT TERM GOAL #1   Title  Pt will report that she has had 25% improvement in pain in left chest and axilla so that she can perform her kitchen tasks easier.     Status  Achieved      PT SHORT TERM GOAL #2   Title  Pt will report she is doing her arm range of motion exercises 5 times a week     Status  Achieved      PT SHORT TERM GOAL #3   Title  Vestibular goal: pt will participate in further assessment of gait with FGA and gait velocity    Status  Deferred      PT SHORT TERM GOAL #4   Title  Vestibular goal: pt will participate in motion sensitivity and SOT testing  with habituation HEP to be initiated    Status  Deferred      Short Term Clinic Goals - 08/28/17 1118      CC Short Term Goal  #1   Title  Pt will report she knows strategies for good body mechanics to decrease low back pain     Status  Achieved      CC Short Term Goal  #2   Title  Pt will have a  deacrease in circumference 15 cm prox to ulnar styloid of left forearm by 1 cm     Baseline  25 cm , on 05/16/2017 it is 23.5    Status  Achieved      CC Short Term Goal  #3   Title  Pt will report she knows how to manage left UE lymphedema with use of compression, elevation and exercise, self manual lymph drainage and possibly Flexitouch     Baseline  Pt has day and nighttime garments and Flexitouch     Status  Achieved       PT Long Term Goals - 02/13/18 1707      PT LONG TERM GOAL #1   Title  Pt will have decrease in left forearm circumference at 10 cm proximal to ulnar styolid to 20 cm     Baseline  22 cm on 01/03/2018     Status  On-going      PT LONG TERM GOAL #2   Title  Pt will have 150 degrees of shoulder flexion in supine indicating so that she will have greater ease in reaching activites in her daily life     Baseline  varies as pt has decreased ROM when she has increased pain and swelling     Status  On-going      PT LONG TERM GOAL #3   Title  Pt will report she is participating in general exercise program of her choosing  3 times per week     Status  On-going      PT LONG TERM GOAL #4   Title  Pt will report >/= 10 point improvement in overall function (as reported on FOTO) by D/C    Status  Deferred      PT LONG TERM GOAL #5   Title  Pt will report decrease in symptom severity to </= 1/5 on MSQ    Status  Deferred      PT LONG TERM GOAL #6   Title  Pt will improve FGA by 8 points to decrease falls risk    Status  Deferred      PT LONG TERM GOAL #7   Title  Pt will improve gait velocity to >3.6 ft/sec     Status  Deferred      PT LONG TERM GOAL #8   Title  Pt  will improve SOT composite score by 10 points    Status  Deferred        Long Term Clinic Goals - 08/28/17 1119      CC Long Term Goal  #1   Title  Patient will report  that she can manage back pain  so she can perform daily activities with greater ease    Status  Achieved      CC Long Term Goal  #2   Title  Pt will  have given the TENS unit a trial to see if it will help with back pain     Baseline  has not needed to use TENS     Status  Deferred      CC Long Term Goal  #3   Title  Pt will be independent in core and  LE strengthening exercises       CC Long Term Goal  #4   Title  Pt will report low back pain is decreased to a 4/10 so that she can perform activities of daily living easier     Baseline  It depends on  the day , but pt needs to take breaks and knows how to manage her pain, she feels this goal is achieved     Status  Achieved         Plan - 02/18/18 1710    Clinical Impression Statement  Focused on manual lymph drainage today as pt reports noticing her swelling more prominent. Reports feeling some relief by end of session. Her meds have changed since her diagnosis has recently changed to include bone mets. She will bring this information with her next time.     Rehab Potential  Fair    Clinical Impairments Affecting Rehab Potential  mulitple areas of diffuse bony metastasis, chemotherapy with neuropathy , radiation to back  and chest,    PT Frequency  2x / week    PT Duration  8 weeks    PT Treatment/Interventions  ADLs/Self Care Home Management;Manual lymph drainage;Compression bandaging;Scar mobilization;Passive range of motion;Patient/family education;Taping;Manual techniques;Therapeutic exercise;Therapeutic activities;Orthotic Fit/Training;DME Instruction;Electrical Stimulation;Moist Heat;Gait training;Stair training;Functional mobility training;Balance training;Neuromuscular re-education;Vestibular    PT Next Visit Plan   Core and LE exercise in supine and  standing.consider treadmill and balance work.  Manual lymph drainage to left UE and back with myofascial release work to left chest Progress exercise and continue encouragement of community exercise     Consulted and Agree with Plan of Care  Patient       Patient will benefit from skilled therapeutic intervention in order to improve the following deficits and impairments:  Decreased range of motion, Impaired UE functional use, Decreased activity tolerance, Decreased knowledge of precautions, Decreased skin integrity, Impaired perceived functional ability, Pain, Decreased knowledge of use of DME, Increased edema, Postural dysfunction, Decreased strength, Impaired sensation, Decreased scar mobility, Decreased mobility, Increased muscle spasms, Dizziness, Increased fascial restricitons, Decreased balance, Difficulty walking  Visit Diagnosis: Disorder of the skin and subcutaneous tissue related to radiation, unspecified  Abnormal posture  Lymphedema, not elsewhere classified  Chronic left shoulder pain     Problem List Patient Active Problem List   Diagnosis Date Noted  . Pain from bone metastases ([place]) 09/11/2017  . Counseling regarding goals of care 04/16/2017  . Bone metastases ([place]) 02/28/2017  . Uncontrolled pain 02/28/2017  . Osteopenia determined by x-ray 09/20/2016  . Primary malignant neoplasm of left breast with stage 2 nodal metastasis per [employer] on Cancer 7th edition (N2) ([place]) 02/14/2016  . Chemotherapy-induced neuropathy ([place]) 12/30/2015  . Insomnia 09/01/2015  . Malignant neoplasm of upper-outer quadrant of left breast in female, estrogen receptor positive ([place]) 08/08/2015    Otelia Limes, PTA 02/18/2018, 5:16 PM  [place], [place], [address] Phone: [phone]   Fax:  [phone]  Name: Alison [name] MRN: [id number] Date of Birth: Mar 28, 1961

## 2018-02-20 ENCOUNTER — Encounter: Payer: 59 | Admitting: Physical Therapy

## 2018-02-20 ENCOUNTER — Other Ambulatory Visit: Payer: Self-pay | Admitting: Oncology

## 2018-02-21 ENCOUNTER — Telehealth: Payer: Self-pay | Admitting: Pharmacist

## 2018-02-21 DIAGNOSIS — C779 Secondary and unspecified malignant neoplasm of lymph node, unspecified: Principal | ICD-10-CM

## 2018-02-21 DIAGNOSIS — C50912 Malignant neoplasm of unspecified site of left female breast: Secondary | ICD-10-CM

## 2018-02-21 MED ORDER — DEXAMETHASONE 0.5 MG/5ML PO SOLN: ORAL | 2 refills | Status: DC

## 2018-02-21 MED FILL — DEXAMETHASONE 0.5 MG/5 ML L: 0.5 | 12 days supply | Qty: 500 | Fill #0

## 2018-02-21 NOTE — Telephone Encounter (Signed)
Oral Oncology Pharmacist Encounter  Received new prescription for Afinitor (everolimus) for the treatment of progressive, hormone receptor positive breast cancer in conjunction with exemestane, planned duration until disease progression or unacceptable toxicity.  Labs from 02/12/18 assessed, OK for treatment.  Current medication list in Epic reviewed, no significant DDIs with Afinitor or exemestane identified.  Prescription has been e-scribed to the [facility] for benefits analysis and approval. Dexamethasone rinse Rx has also been sent to pharmacy for stomatitis prevention.  Oral Oncology Clinic will continue to follow for insurance authorization, copayment issues, initial counseling and start date.  Johny Drilling, PharmD, BCPS, BCOP  02/21/2018 1:30 PM Oral Oncology Clinic [phone]

## 2018-02-24 ENCOUNTER — Encounter: Payer: Self-pay | Admitting: Oncology

## 2018-02-24 ENCOUNTER — Other Ambulatory Visit: Payer: Self-pay | Admitting: *Deleted

## 2018-02-25 ENCOUNTER — Encounter: Payer: 59 | Admitting: Physical Therapy

## 2018-02-25 NOTE — Telephone Encounter (Signed)
Oral Oncology Pharmacist Encounter  I spoke with patient today to provide update on new start of Afinitor. Patient informed that insurance authorization has been submitted, and is still pending. Patient states she has already started on her exemestane. She is planned to start on Afinitor after return from her vacation. Patient will be on vacation 02/28/2018 through 03/15/2018.  Patient requests I call her back on Monday, 03/17/2018 for initial counseling for Afinitor and to provide details on prescription processing.  Patient states she is experiencing a headache that started 48 hours ago. It is mildly relieved with Tylenol allergy. Patient wonders if it is due to exemestane or due to allergies. We will follow-up with patient about status of headache while on vacation when we speak with her on 7/22.  All questions answered. Patient expressed understanding and appreciation. Oral oncology clinic will continue to follow.  Johny Drilling, PharmD, BCPS, BCOP  02/25/2018 10:47 AM Oral Oncology Clinic [phone]

## 2018-02-26 ENCOUNTER — Other Ambulatory Visit: Payer: Self-pay

## 2018-02-26 ENCOUNTER — Telehealth: Payer: Self-pay | Admitting: Pharmacist

## 2018-02-26 ENCOUNTER — Ambulatory Visit: Payer: 59 | Attending: General Surgery

## 2018-02-26 ENCOUNTER — Telehealth: Payer: Self-pay

## 2018-02-26 DIAGNOSIS — L599 Disorder of the skin and subcutaneous tissue related to radiation, unspecified: Secondary | ICD-10-CM

## 2018-02-26 DIAGNOSIS — I890 Lymphedema, not elsewhere classified: Secondary | ICD-10-CM

## 2018-02-26 DIAGNOSIS — R293 Abnormal posture: Secondary | ICD-10-CM | POA: Diagnosis present

## 2018-02-26 DIAGNOSIS — M25512 Pain in left shoulder: Secondary | ICD-10-CM | POA: Insufficient documentation

## 2018-02-26 DIAGNOSIS — G8929 Other chronic pain: Secondary | ICD-10-CM

## 2018-02-26 DIAGNOSIS — C50412 Malignant neoplasm of upper-outer quadrant of left female breast: Secondary | ICD-10-CM

## 2018-02-26 DIAGNOSIS — Z170 Estrogen receptor positive status [ER+]: Principal | ICD-10-CM

## 2018-02-26 MED ORDER — OXYCODONE HCL ER 80 MG PO T12A: 160.0000 mg | EXTENDED_RELEASE_TABLET | Freq: Three times a day (TID) | ORAL | 0 refills | Status: DC

## 2018-02-26 MED ORDER — OXYCODONE HCL 20 MG PO TABS: 20.0000 mg | ORAL_TABLET | ORAL | 0 refills | Status: DC | PRN

## 2018-02-26 NOTE — Therapy (Addendum)
Alison [name], [place], [address] Phone: [phone]   Fax:  [phone]  Physical Therapy Treatment  Patient Details  Name: Alison [name] MRN: [id number] Date of Birth: 1961-01-24 Referring Provider: Dr. [name]    Encounter Date: 02/26/2018  PT End of Session - 02/26/18 1703    Visit Number  24    Number of Visits  42    Date for PT Re-Evaluation  03/15/18    PT Start Time  1937    PT Stop Time  1655    PT Time Calculation (min)  48 min    Activity Tolerance  Patient tolerated treatment well    Behavior During Therapy  [facility] for tasks assessed/performed       Past Medical History:  Diagnosis Date  . Anxiety   . Asthma     seasonal asthma  . Breast cancer ([place])    metastatic breast cancer  . Complication of anesthesia 2007   aspiration pneumonia post hysterectomy.  Blood pressure would drop low- but no problems 01/2016- surgery  . History of blood transfusion   . History of mitral valve prolapse 1988   with palpitations  . History of radiation therapy 04/02/16- 05/21/16   Left Chest Wall, SCV, Axilla, IM nodes  . Hypertension    no meds  . Neuropathy    with chemo  . Osteopenia determined by x-ray   . Pneumonia    1980's and 1990's  . Raynaud's disease   . 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 Skin cancer 03/12/2014   basal- back    Past Surgical History:  Procedure Laterality Date  . ABDOMINAL HYSTERECTOMY  2007   with BSO  . APPENDECTOMY  2007  . AXILLARY LYMPH NODE DISSECTION Left 02/14/2016   Procedure: LEFT AXILLARY LYMPH NODE DISSECTION;  Surgeon: [name], MD;  Location: [place];  Service: General;  Laterality: Left;  . BREAST RECONSTRUCTION WITH PLACEMENT OF TISSUE EXPANDER AND FLEX HD (ACELLULAR HYDRATED DERMIS) Left 02/02/2016   Procedure: IMMEDIATE LEFT BREAST RECONSTRUCTION WITH PLACEMENT OF TISSUE EXPANDER AND FLEX HD (ACELLULAR HYDRATED  DERMIS);  Surgeon: [name], DO;  Location: [facility];  Service: Plastics;  Laterality: Left;  . BREAST REDUCTION SURGERY Right 03/28/2017   Procedure: RIGHT MAMMARY REDUCTION  (BREAST);  Surgeon: [name], DO;  Location: [facility];  Service: Plastics;  Laterality: Right;  . BREAST SURGERY Left 02/02/16   mastectomy with reconstruction  . c-section  1991  . COLON RESECTION SIGMOID  12/02/2012   [facility]   . HERNIA REPAIR  2007  . LAPAROSCOPIC ENDOMETRIOSIS FULGURATION  1984, 1989  . MASTOPEXY Right 03/28/2017   Procedure: MASTOPEXY;  Surgeon: [name], DO;  Location: [facility];  Service: Plastics;  Laterality: Right;  . Port- a- cath placement  07/2015  . PORT-A-CATH REMOVAL Right 02/02/2016   Procedure: REMOVAL PORT-A-CATH;  Surgeon: [name], MD;  Location: [place] [place];  Service: General;  Laterality: Right;  . RADIOACTIVE SEED GUIDED AXILLARY SENTINEL LYMPH NODE Left 02/02/2016   Procedure: LEFT BREAST MASTECTOMY AND RADIOACTIVE SEED GUIDED LEFT SENTINEL LYMPH NODE BIOPSY ;  Surgeon: [name], MD;  Location: [place];  Service: General;  Laterality: Left;  . REMOVAL OF TISSUE EXPANDER AND PLACEMENT OF IMPLANT Left 03/28/2017   Procedure: REMOVAL OF TISSUE EXPANDER AND PLACEMENT OF IMPLANT;  Surgeon: [name], DO;  Location: [facility];  Service: Plastics;  Laterality: Left;    There were no vitals filed for this visit.  Subjective Assessment - 02/26/18 1612    Subjective  I had acupuncture since I was here last. It gave me relief for about 1-2 hours and I was able to try on dresses which I haven't been able to do for a few years. Today my swelling is increased at my Lt lateral trunk.     Pertinent History  Breast cancer diagnosed November 2016 She had chemotherapy. but did have some neuropathy so it had to be stopped. On June 8, she had a mastectomy with  immedicate expander placement and with first set of lymphnodes removed, On June 20 she went for complete axillary lymph node dissection. She says she has had always been "full necked" but has neck and upper body fullness since the cancer was diagnosed. She reports she has osteopenia in low back and has had a fracture that was discovered in 2014 when she had a Dexa scan. Pt has had metastasis to the bones of the spine and recent  bilateral breast surgery for a reduction on the right and expander removal on the left with implant placement and removal  02/13/2018: progression of bone mets on PET scan     Patient Stated Goals   to help manage pain and swelling in axilla ,prevent falls and continue with current quality of life     Currently in Pain?  No/denies                       [facility] Adult PT Treatment/Exercise - 02/26/18 0001      Manual Therapy   Myofascial Release  to left chest very gently to pts tolerance    Manual Lymphatic Drainage (MLD)  Short neck, superficial and deep abdominals, Rt axillary nodes, anterior interaxillary anastamosis, left axilla and lateral chest, then to sidelying for posterior back working along posterior inter-axillary anastomosis.    Passive ROM  Gentle P/ROM to Lt shoulder as tolerated into flexion and abduction               PT Short Term Goals - 02/13/18 1706      PT SHORT TERM GOAL #1   Title  Pt will report that she has had 25% improvement in pain in left chest and axilla so that she can perform her kitchen tasks easier.     Status  Achieved      PT SHORT TERM GOAL #2   Title  Pt will report she is doing her arm range of motion exercises 5 times a week     Status  Achieved      PT SHORT TERM GOAL #3   Title  Vestibular goal: pt will participate in further assessment of gait with FGA and gait velocity    Status  Deferred      PT SHORT TERM GOAL #4   Title  Vestibular goal: pt will participate in motion sensitivity and SOT testing  with habituation HEP to be initiated    Status  Deferred      Short Term Clinic Goals - 08/28/17 1118      CC Short Term Goal  #1   Title  Pt will report she knows strategies for good body mechanics to decrease low back pain     Status  Achieved      CC Short Term Goal  #2   Title  Pt  will have a deacrease in circumference 15 cm prox to ulnar styloid of left forearm by 1 cm     Baseline  25 cm , on 05/16/2017 it is 23.5    Status  Achieved      CC Short Term Goal  #3   Title  Pt will report she knows how to manage left UE lymphedema with use of compression, elevation and exercise, self manual lymph drainage and possibly Flexitouch     Baseline  Pt has day and nighttime garments and Flexitouch     Status  Achieved       PT Long Term Goals - 02/13/18 1707      PT LONG TERM GOAL #1   Title  Pt will have decrease in left forearm circumference at 10 cm proximal to ulnar styolid to 20 cm     Baseline  22 cm on 01/03/2018     Status  On-going      PT LONG TERM GOAL #2   Title  Pt will have 150 degrees of shoulder flexion in supine indicating so that she will have greater ease in reaching activites in her daily life     Baseline  varies as pt has decreased ROM when she has increased pain and swelling     Status  On-going      PT LONG TERM GOAL #3   Title  Pt will report she is participating in general exercise program of her choosing  3 times per week     Status  On-going      PT LONG TERM GOAL #4   Title  Pt will report >/= 10 point improvement in overall function (as reported on FOTO) by D/C    Status  Deferred      PT LONG TERM GOAL #5   Title  Pt will report decrease in symptom severity to </= 1/5 on MSQ    Status  Deferred      PT LONG TERM GOAL #6   Title  Pt will improve FGA by 8 points to decrease falls risk    Status  Deferred      PT LONG TERM GOAL #7   Title  Pt will improve gait velocity to >3.6 ft/sec     Status  Deferred      PT LONG TERM GOAL #8   Title  Pt  will improve SOT composite score by 10 points    Status  Deferred        Long Term Clinic Goals - 08/28/17 1119      CC Long Term Goal  #1   Title  Patient will report  that she can manage back pain  so she can perform daily activities with greater ease    Status  Achieved      CC Long Term Goal  #2   Title  Pt will  have given the TENS unit a trial to see if it will help with back pain     Baseline  has not needed to use TENS     Status  Deferred      CC Long Term Goal  #3   Title  Pt will be independent in core and  LE strengthening exercises       CC Long Term Goal  #4   Title  Pt will report low back pain is decreased to a 4/10 so that she can perform activities of daily living easier     Baseline  It depends on the day , but pt needs to take breaks and knows how to manage her pain, she feels this goal is achieved     Status  Achieved         Plan - 02/26/18 1704    Clinical Impression Statement  Continued with manual lymph drainage as pt reports noticing increased lymphedema at Lt lateral trunk. Also worked on pts end P/ROM gently and she reports tolerating this well. She will be out of town for the next 2 weeks and plans to resume therapy at that time.     Rehab Potential  Fair    Clinical Impairments Affecting Rehab Potential  mulitple areas of diffuse bony metastasis, chemotherapy with neuropathy , radiation to back  and chest,    PT Frequency  2x / week    PT Duration  8 weeks    PT Treatment/Interventions  ADLs/Self Care Home Management;Manual lymph drainage;Compression bandaging;Scar mobilization;Passive range of motion;Patient/family education;Taping;Manual techniques;Therapeutic exercise;Therapeutic activities;Orthotic Fit/Training;DME Instruction;Electrical Stimulation;Moist Heat;Gait training;Stair training;Functional mobility training;Balance training;Neuromuscular re-education;Vestibular    PT Next Visit Plan   Renewal at next session; Core and LE exercise in  supine and standing.consider treadmill and balance work.  Manual lymph drainage to left UE and back with myofascial release work to left chest Progress exercise and continue encouragement of community exercise     Consulted and Agree with Plan of Care  Patient       Patient will benefit from skilled therapeutic intervention in order to improve the following deficits and impairments:  Decreased range of motion, Impaired UE functional use, Decreased activity tolerance, Decreased knowledge of precautions, Decreased skin integrity, Impaired perceived functional ability, Pain, Decreased knowledge of use of DME, Increased edema, Postural dysfunction, Decreased strength, Impaired sensation, Decreased scar mobility, Decreased mobility, Increased muscle spasms, Dizziness, Increased fascial restricitons, Decreased balance, Difficulty walking  Visit Diagnosis: Disorder of the skin and subcutaneous tissue related to radiation, unspecified  Abnormal posture  Lymphedema, not elsewhere classified  Chronic left shoulder pain     Problem List Patient Active Problem List   Diagnosis Date Noted  . Pain from bone metastases ([place]) 09/11/2017  . Counseling regarding goals of care 04/16/2017  . Bone metastases ([place]) 02/28/2017  . Uncontrolled pain 02/28/2017  . Osteopenia determined by x-ray 09/20/2016  . Primary malignant neoplasm of left breast with stage 2 nodal metastasis per American Joint Committee on Cancer 7th edition (N2) ([place]) 02/14/2016  . Chemotherapy-induced neuropathy ([place]) 12/30/2015  . Insomnia 09/01/2015  . Malignant neoplasm of upper-outer quadrant of left breast in female, estrogen receptor positive ([place]) 08/08/2015    Otelia Limes, PTA 02/26/2018, 5:12 PM  [place], [place], [address] Phone: [phone]   Fax:  [phone]  Name: MERRIT WAUGH MRN: [id number] Date of Birth:  1960-11-22  PHYSICAL THERAPY DISCHARGE SUMMARY  Visits from Start of Care: 24  Current functional level related to goals / functional outcomes: Pt is deceased     Maudry Diego, PT 18-Aug-2018 12:56 PM

## 2018-02-26 NOTE — Telephone Encounter (Signed)
Prescription for Oxycodone 20mg  given to pt's spouse.

## 2018-02-26 NOTE — Telephone Encounter (Signed)
Oral Chemotherapy Pharmacist Encounter   I spoke with patient for overview of: Afinitor.   Counseled patient on administration, dosing, side effects, monitoring, drug-food interactions, safe handling, storage, and disposal.  Patient will take Afinitor 10mg  tablets, 1 tablet by mouth once daily, with water, without regard to food.  Patient understands to take Afinitor consistently with regards to food and at approximately the same time each day.  Patient knows to aviod grapefruit or grapefruit juice while on therapy with Afinitor.  Patient is taking exemestane 25mg  tablets, 1 tablet by mouth once daily after breakfast.  Afinitor start date: planned for 03/17/18  Adverse effects include but are not limited to: mouth sores, GI upset, nausea, diarrhea, constipation, rash, increased blood sugars, decreased blood counts, increased blood pressure, and edema.  Dexamethasone mouthwash for the prevention of stomatitis has been e-scribed to local pharmacy.  She has received it. We discussed appropriate use of mouthwash and duration of stomatitis prevention. Patient states she has experienced mouth sores in the past and is grateful for a prevention strategy.  We discussed use of cetirizine if she develops rash and alert the office.  Reviewed with patient importance of keeping a medication schedule and plan for any missed doses.  Mrs. Youngberg voiced understanding and appreciation.   All questions answered. Medication reconciliation performed and medication/allergy list updated.  We are still awaiting insurance authorization of Afinitor. Once received I will coordinate with the [facility] shipment of Afintior and exemestane to patient's home. Plan is for shipment on 7/18 for delivery to her house 7/19 to ensure she does not run out of exemestane. She will be on vacation 02/28/18-03/15/18, her son will bring in the shipment on 7/19. She plans to start 7/22 AM.  Patient knows to  call the office with questions or concerns. Oral Oncology Clinic will continue to follow.  Thank you,  Johny Drilling, PharmD, BCPS, BCOP  02/26/2018 9:31 AM Oral Oncology Clinic [phone]

## 2018-02-26 NOTE — Telephone Encounter (Signed)
Oral Oncology Pharmacist Encounter  Received notification from the [facility] that prior insurance authorization is required for Afinitor. PA was submitted by fax to [facility] at [phone] on 02/21/18 Request was marked Non-urgent at that time.  I called today (02/26/18) for status check. I answered clinical questions due. We had not yet been notified by insurance that additional information was needed. I was informed standard turn around time for non-urgent requests was 15 days. I marked request as urgent, will receive determination in 24-48 hours.  This encounter will be updated until final determination.  Johny Drilling, PharmD, BCPS, BCOP  02/26/2018 2:06 PM Oral Oncology Clinic [phone]

## 2018-02-26 NOTE — Progress Notes (Signed)
Refilled oxycodone 80mg  q12hrs by mistake. Unable to edit refill date.Not due for another refill until 7/12 (Friday)  Refill was requested for oxycodone 20mg . Refilled requested medication and gave to patient.

## 2018-02-28 NOTE — Telephone Encounter (Signed)
Oral Oncology Pharmacist Encounter  Received notification of insurance authorization approval for Afinitor from [facility] Patient ID: [id number] Effective dates: 02/21/2018-08/25/2018  Test claim at the pharmacy revealed copayment $200 for 28-day supply  Patient's prescription insurance will allow 1 fill at the [facility]. This fill will be processed on 03/13/2018 for delivery to patient's home on 03/14/2018 The Afinitor will be shipped to patient's home with her next fill of exemestane. Patient returns from vacation on 03/15/2018. She will start her Afinitor on Monday, 03/17/2018.  Per insurance requirement, all subsequent refills of Afinitor must come from [employer]. Patient will be updated about dispensing pharmacy change when she returns from her vacation.  I was successful in obtaining universal co-pay card from [employer] to reduce patient's out-of-pocket expense for Afinitor to $0 ID: [id number] Group: [id number] BIN: [id number] PCN: [id number]  Billing information for copayment card has been shared with the [facility] outpatient pharmacy. It has been applied to her account, and her copayment for Afinitor is $0.  I will fax co-pay card information to [facility] when I send the prescriptions for Afinitor. I will wait until I am able to update patient on dispensing pharmacy change the week of 03/17/2018 before sending Afinitor prescriptions and co-pay card information to [facility].  Johny Drilling, PharmD, BCPS, BCOP  02/28/2018 1:02 PM Oral Oncology Clinic [phone]

## 2018-03-04 NOTE — Telephone Encounter (Signed)
No entry 

## 2018-03-06 ENCOUNTER — Telehealth: Payer: Self-pay

## 2018-03-12 ENCOUNTER — Ambulatory Visit: Payer: 59 | Admitting: Oncology

## 2018-03-12 ENCOUNTER — Ambulatory Visit: Payer: 59

## 2018-03-12 ENCOUNTER — Telehealth: Payer: Self-pay | Admitting: Pharmacist

## 2018-03-12 ENCOUNTER — Other Ambulatory Visit: Payer: 59

## 2018-03-12 NOTE — Telephone Encounter (Signed)
Oral Oncology Pharmacist Encounter  Received call from patient with continued complaints of headaches since starting exemestane and new complaints of vertigo.  Patient [place] at the beach for a week and a half and stated that this past Monday (03/10/2018) that she had an episode of extreme hypertension and near syncope. Patient was taken to local emergency department and states that EKG was performed and was normal, and CT head was performed and was normal. Patient states her blood pressure was 189/102.  Patient has stopped exemestane up to this point. Headaches and vertigo persist. Patient with questions about OTC options for vertigo so that she may enjoy her beach trip.  Patient instructed to continue to hold exemestane until seen back in the office. Patient was planned to start Afinitor on 03/17/2018, patient instructed to hold initiation of Afinitor until seen back into the office.  Patient instructed that she can get OTC remedy for motion sickness and see if this helps with her dizziness/vertigo.  Patient states she will discuss above symptoms with Dr. [name] when she sees him on 03/19/2018. She understands to hold her medications for breast cancer until evaluated by Dr. [name].  Note will be routed to MD and collaborative practice RN.  All questions answered. Patient expressed understanding and appreciation. Patient knows to call the office with any additional questions or concerns.  Johny Drilling, PharmD, BCPS, BCOP  03/12/2018 11:24 AM Oral Oncology Clinic [phone]

## 2018-03-12 NOTE — Telephone Encounter (Signed)
Thank you, Alison [name]!  GM

## 2018-03-13 MED FILL — AFINITOR 10 MG TABLET: 10 | 28 days supply | Qty: 28 | Fill #0

## 2018-03-14 ENCOUNTER — Other Ambulatory Visit: Payer: 59

## 2018-03-14 ENCOUNTER — Ambulatory Visit: Payer: 59

## 2018-03-14 NOTE — Telephone Encounter (Signed)
Oral Oncology Patient Advocate Encounter  I verified with [facility] that Afinitor was mailed to patient on 03-13-18.  [place] Patient [place] Phone [phone] Fax [phone]

## 2018-03-18 ENCOUNTER — Encounter: Payer: 59 | Admitting: Physical Therapy

## 2018-03-18 NOTE — Progress Notes (Signed)
[place]  [phone]) [phone] Fax:(336) [phone]   ID: Kearney Hard DOB: Jan 25, 1961  MR#: [id number]  EPP#:[id number]  Patient Care Team: [name], MD as PCP - General (Internal Medicine) Erroll Luna, MD as Consulting Physician (General Surgery) Kazoua Gossen, Virgie Dad, MD as Consulting Physician (Oncology) Aloha Gell, MD as Consulting Physician (Obstetrics and Gynecology) Harriett Sine, MD as Consulting Physician (Dermatology) Benson Norway, RN as Registered Nurse (Oncology) Eppie Gibson, MD as Attending Physician (Radiation Oncology) Venetia Night, MD (Hematology and Oncology) Clydell Hakim, MD as Consulting Physician (Anesthesiology) PCP: [name], MD OTHER MD:  CHIEF COMPLAINT: Estrogen receptor positive breast cancer  CURRENT TREATMENT:exemestane, everolimus, denosumab/Xgeva  INTERVAL HISTORY: Alison [name] returns today for a follow-up and treatment of her 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   She was on exemestane, but on 03/10/2018 she was seen at [facility] with a complaint of dizziness and near syncope.  She also reported headaches.  On exam she had a blood pressure of 139/83.  Neurologic exam was normal.  Noncontrast head CT was obtained.  There was a questionable focal hypodensity in the cerebellar vermis; there was moderate atrophy.  Contrast enhanced MRI was recommended.  EKG showed normal sinus rhythm.  The patient stopped her exemestane at that time and it hasn't solved her issues.   She also received everolimus but has not started those at this time.  Finally she receives [employer], with a dose due today.   Alison [name] also has chronic pain related to her bone metastases.  Review of PMP aware shows that she received 45 carisoprodol tablets from Dr. Maryjean Ka on 02/04/2018 and she received 180 oxycodone 20 mg from [place] 02/26/2018.  She had a prescription dating back to  01/15/2018 for dextroamphetamine which was filled 03/17/2018.  This was written by Dr. Osborne Casco the patient's primary care physician the patient is using a single pharmacy.  Besides Dr. Osborne Casco Dr. Maryjean Ka and ourselves there are no other physicians writing controlled substances.  She continues on Oxycodone 3 tablets three times a day and oxycontin 2 tablets three times a day with relief of her bone pain. She takes carisoprodol that helps her to sleep at night.   REVIEW OF SYSTEMS:  Alison [name] reports that she had a tension headache that started several weeks ago. She went to the beach recently and her tension headache and dizziness (chronic) worsened. Pt was intially sitting in a chair on the ground and her husband helped her to stand prior to walking on the beach. While walking on the beach, sh noted that she started having a spinning sensation and she "slumped" to the ground. She was evaluated in the ED following and informed that she nearly blacked out. She hasn't done anything this week due to her increasing headache. If she moves her head, then it makes her room spinning sensation worse along with nausea (treating with Rx Compazine. Her vision is curly on the outside. She denies unusual headaches, visual changes, or vomiting. There has been no unusual cough, phlegm production, or pleurisy. This been no change in bowel or bladder habits. She denies unexplained fatigue or unexplained weight loss, bleeding, rash, or fever. A detailed review of systems was otherwise stable.    BREAST CANCER HISTORY: From the original intake note:  Alison [name] had routine screening mammography with tomography [facility] February 20 11/14/2014 showing a possible mass in the right breast. Diagnostic right mammography with right breast ultrasonography 11/01/2014  found the breast density to be category C. In the upper outer quadrant of the right breast there was a partially obscured mass which on physical exam was palpable  as an area of thickening at the 10:00 position 8 cm from the nipple. Ultrasound showed multiple cysts in the upper outer right breast corresponding to the mass in question.  In November 2016 the patient felt a change in the upper left breast and brought it to her primary care physician's attention. Diagnostic left mammography with tomosynthesis and left breast ultrasonography at the [facility] December 01/14/2015 found trabecular thickening throughout the left breast with skin thickening, but no circumscribed mass or suspicious calcifications. A rounded mass in the left axilla measured 6 mm. There was an additional mildly prominent lymph node in the left axilla which was what the patient had been palpating. Ultrasonography showed ill-defined swelling throughout the inner left breast with overlying skin thickening.. At the 2:00 position 4 cm from the nipple there was an irregular hypoechoic mass measuring 2.3 cm. A second mass at the 1:00 area 3 cm from the nipple measured 1 cm. The distance between these 2 masses was 1.8 cm. There was also a round hypoechoic left axillary mass measuring 0.6 cm. Overall the was an area of 3.7 cm of mixed echogenicity corresponding to the area of palpable soft tissue swelling.   On 08/04/2015 the patient underwent biopsy of the mass at the 2:00 axis 4 cm from the nipple and a second biopsy was performed of the hypoechoic mass at the 1:00 position 3 cm from the nipple. Biopsy of the suspicious nodule in the lower left axilla was attempted but could not be completed as the mass became obscured after administration of lidocaine.  The pathology from both left breast biopsies 08/04/2015 ([id number]) showed invasive ductal carcinoma, grade 3. The prognostic profile obtained from the larger tumor showed estrogen receptor to be strongly positive at 95%, progesterone receptor positive at 30%, with an MIB-1 of 12%, and no HER-2 amplification, the signals ratio being 1.22 and the  number per cell 2.20.  On 08/09/2015 the patient underwent biopsy of this suspicious left axillary lymph node noted above, and this showed ([id number]) metastatic carcinoma with extracapsular extension.  The patient's subsequent history is as detailed below    PAST MEDICAL HISTORY: Past Medical History:  Diagnosis Date  . Anxiety   . Asthma     seasonal asthma  . Breast cancer (HCC)    metastatic breast cancer  . Complication of anesthesia 2007   aspiration pneumonia post hysterectomy.  Blood pressure would drop low- but no problems 01/2016- surgery  . History of blood transfusion   . History of mitral valve prolapse 1988   with palpitations  . History of radiation therapy 04/02/16- 05/21/16   Left Chest Wall, SCV, Axilla, IM nodes  . Hypertension    no meds  . Neuropathy    with chemo  . Osteopenia determined by x-ray   . Pneumonia    1980's and 1990's  . Raynaud's disease   . 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 Skin cancer 03/12/2014   basal- back    PAST SURGICAL HISTORY: Past Surgical History:  Procedure Laterality Date  . ABDOMINAL HYSTERECTOMY  2007   with BSO  . APPENDECTOMY  2007  . AXILLARY LYMPH NODE DISSECTION Left 02/14/2016   Procedure: LEFT AXILLARY LYMPH NODE DISSECTION;  Surgeon: Faera Byerly, MD;  Location: [facility];  Service:   General;  Laterality: Left;  . BREAST RECONSTRUCTION WITH PLACEMENT OF TISSUE EXPANDER AND FLEX HD (ACELLULAR HYDRATED DERMIS) Left 02/02/2016   Procedure: IMMEDIATE LEFT BREAST RECONSTRUCTION WITH PLACEMENT OF TISSUE EXPANDER AND FLEX HD (ACELLULAR HYDRATED DERMIS);  Surgeon: [name], DO;  Location: [place];  Service: Plastics;  Laterality: Left;  . BREAST REDUCTION SURGERY Right 03/28/2017   Procedure: RIGHT MAMMARY REDUCTION  (BREAST);  Surgeon: [name], DO;  Location: [place];  Service: Plastics;  Laterality: Right;  . BREAST SURGERY Left  02/02/16   mastectomy with reconstruction  . c-section  1991  . COLON RESECTION SIGMOID  12/02/2012   [facility]   . HERNIA REPAIR  2007  . LAPAROSCOPIC ENDOMETRIOSIS FULGURATION  1984, 1989  . MASTOPEXY Right 03/28/2017   Procedure: MASTOPEXY;  Surgeon: [name], DO;  Location: [place];  Service: Plastics;  Laterality: Right;  . Port- a- cath placement  07/2015  . PORT-A-CATH REMOVAL Right 02/02/2016   Procedure: REMOVAL PORT-A-CATH;  Surgeon: [name], MD;  Location: [place];  Service: General;  Laterality: Right;  . RADIOACTIVE SEED GUIDED AXILLARY SENTINEL LYMPH NODE Left 02/02/2016   Procedure: LEFT BREAST MASTECTOMY AND RADIOACTIVE SEED GUIDED LEFT SENTINEL LYMPH NODE BIOPSY ;  Surgeon: [name], MD;  Location: [place];  Service: General;  Laterality: Left;  . REMOVAL OF TISSUE EXPANDER AND PLACEMENT OF IMPLANT Left 03/28/2017   Procedure: REMOVAL OF TISSUE EXPANDER AND PLACEMENT OF IMPLANT;  Surgeon: [name], DO;  Location: [place];  Service: Plastics;  Laterality: Left;    FAMILY HISTORY Family History  Problem Relation Age of Onset  . Lung cancer Father    the patient's father died from lung cancer at the age of 4 in the setting of tobacco abuse. The patient's mother died at the age of 42 with pancreatic cancer. The patient had no siblings. There is no history of breast or ovarian cancer in the family.  GYNECOLOGIC HISTORY:  No LMP recorded. Patient has had a hysterectomy. Menarche age 33, first live birth age 52. The patient is GX P1. She is status post total hysterectomy with bilateral salpingo-oophorectomy. She has been on hormone replacement since that time and is tapering to off at the time of this dictation.   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 She used to work at [employer]. Her husband Alison [name] works for [employer] division at a [employer] their son  Alison [name] is currently at home.    ADVANCED DIRECTIVES: Not in place   HEALTH MAINTENANCE: Social History   Tobacco Use  . Smoking status: Former Smoker    Packs/day: 1.00    Years: 10.00    Pack years: 10.00    Types: Cigarettes    Last attempt to quit: 11/25/2008    Years since quitting: 9.3  . Smokeless tobacco: Never Used  . Tobacco comment: on and off smoker never consistent  Substance Use Topics  . Alcohol use: Yes    Comment: social  . Drug use: No     Colonoscopy: April 2014/ Raynaldo Opitz at [facility]  PAP: s/p hysterectomy  Bone density: August 2016/ osteopenia  Lipid panel:  Allergies  Allergen Reactions  . Sulfa Antibiotics Other (See Comments)    Blisters in mouth    Current Outpatient Medications  Medication Sig Dispense Refill  . amLODipine (NORVASC) 5 MG tablet Take 5 mg by mouth  daily.    . Ascorbic Acid (VITAMIN C) 1000 MG tablet Take 1,000 mg by mouth 2 (two) times daily.    . carisoprodol (SOMA) 350 MG tablet Take 350 mg by mouth 3 (three) times daily.    . cetirizine (ZYRTEC) 10 MG tablet Take 10 mg by mouth daily.    . Cholecalciferol (VITAMIN D3) 10000 units capsule Take 10,000 Units by mouth daily.     . dexamethasone (DECADRON) 0.5 MG/5ML solution Swish 10mL in mouth for 2min and spit out. Use 4 times daily for 8 weeks, start with Afinitor. Avoid eating/drinking for 1hr after rinse. 500 mL 2  . escitalopram (LEXAPRO) 20 MG tablet Take 1 tablet (20 mg total) by mouth daily. 90 tablet 4  . everolimus (AFINITOR) 10 MG tablet Take 1 tablet (10 mg total) by mouth daily. 30 tablet 6  . exemestane (AROMASIN) 25 MG tablet Take 1 tablet (25 mg total) by mouth daily after breakfast. 90 tablet 4  . fluticasone (FLONASE) 50 MCG/ACT nasal spray Place 2 sprays into both nostrils daily. Reported on 11/18/2015    . gabapentin (NEURONTIN) 300 MG capsule Take 4 capsules (1,200 mg total) by mouth at bedtime. TAKE 2 CAPSULES IN THE MORNING AND TAKE 2 CAPSULES MID-DAY AND TAKE  4CAPSULES AT BEDTIME 240 capsule 4  . LORazepam (ATIVAN) 0.5 MG tablet Take 1 tablet (0.5 mg total) by mouth at bedtime. 90 tablet 3  . Multiple Vitamin (MULTIVITAMIN) tablet Take 1 tablet by mouth daily.    . ondansetron (ZOFRAN) 8 MG tablet Take 1 tablet (8 mg total) by mouth every 8 (eight) hours as needed for nausea or vomiting. 20 tablet 5  . oxyCODONE (OXYCONTIN) 80 mg 12 hr tablet Take 2 tablets (160 mg total) by mouth every 8 (eight) hours. 180 tablet 0  . Oxycodone HCl 20 MG TABS Take 1-2 tablets (20-40 mg total) by mouth every 4 (four) hours as needed. 180 tablet 0  . Probiotic Product (PROBIOTIC DAILY PO) Take 1 capsule by mouth daily. Renew Life Extra Care Ultimate Flora 50 Billion    . prochlorperazine (COMPAZINE) 10 MG tablet Take 1 tablet (10 mg total) by mouth every 6 (six) hours as needed (Nausea or vomiting). 90 tablet 2  . traZODone (DESYREL) 50 MG tablet TAKE 3 TABLETS AT BEDTIME 90 tablet 2  . UNABLE TO FIND [facility]    . UNABLE TO FIND [place] mushroom    . UNABLE TO FIND [place] Grifolafrodre    . zolpidem (AMBIEN) 5 MG tablet Take 1 tablet (5 mg total) by mouth at bedtime as needed for sleep. for sleep 30 tablet 2   No current facility-administered medications for this visit.     OBJECTIVE: Middle-aged white woman who was tearful during today's visit Vitals:   03/19/18 0803  BP: (!) 174/98  Pulse: 100  Resp: 18  Temp: 98.8 F (37.1 C)  SpO2: 99%     Body mass index is 27.78 kg/m.    ECOG FS:2 - Symptomatic, <50% confined to bed   Sclerae unicteric, EOMs intact, pupils round and reactive Oropharynx clear and moist, uvula midline No cervical or supraclavicular adenopathy Lungs no rales or rhonchi Heart regular rate and rhythm Abd soft, obese, nontender, positive bowel sounds MSK no focal spinal tenderness Neuro: nonfocal, 5/5 all lesions tested, very unsteady on her feet, depressed affect Breasts: Deferred   LAB RESULTS:  CMP     Component Value Date/Time     NA 139 02/12/2018 1354     NA 138 08/14/2017 1042   K 3.5 02/12/2018 1354   K 4.2 08/14/2017 1042   CL 97 (L) 02/12/2018 1354   CO2 32 (H) 02/12/2018 1354   CO2 26 08/14/2017 1042   GLUCOSE 133 02/12/2018 1354   GLUCOSE 129 08/14/2017 1042   BUN 9 02/12/2018 1354   BUN 8.3 08/14/2017 1042   CREATININE 0.78 02/12/2018 1354   CREATININE 0.74 01/15/2018 1409   CREATININE 0.8 08/14/2017 1042   CALCIUM 9.6 02/12/2018 1354   CALCIUM 9.6 08/14/2017 1042   PROT 7.0 02/12/2018 1354   PROT 6.5 08/14/2017 1042   ALBUMIN 3.8 02/12/2018 1354   ALBUMIN 3.4 (L) 08/14/2017 1042   AST 33 02/12/2018 1354   AST 35 (H) 01/15/2018 1409   AST 63 (H) 08/14/2017 1042   ALT 20 02/12/2018 1354   ALT 26 01/15/2018 1409   ALT 53 08/14/2017 1042   ALKPHOS 95 02/12/2018 1354   ALKPHOS 84 08/14/2017 1042   BILITOT 0.4 02/12/2018 1354   BILITOT 0.4 01/15/2018 1409   BILITOT 0.44 08/14/2017 1042   GFRNONAA >60 02/12/2018 1354   GFRNONAA >60 01/15/2018 1409   GFRAA >60 02/12/2018 1354   GFRAA >60 01/15/2018 1409    INo results found for: SPEP, UPEP  Lab Results  Component Value Date   WBC 4.4 03/19/2018   NEUTROABS 3.0 03/19/2018   HGB 12.7 03/19/2018   HCT 36.8 03/19/2018   MCV 98.6 03/19/2018   PLT 216 03/19/2018      Chemistry      Component Value Date/Time   NA 139 02/12/2018 1354   NA 138 08/14/2017 1042   K 3.5 02/12/2018 1354   K 4.2 08/14/2017 1042   CL 97 (L) 02/12/2018 1354   CO2 32 (H) 02/12/2018 1354   CO2 26 08/14/2017 1042   BUN 9 02/12/2018 1354   BUN 8.3 08/14/2017 1042   CREATININE 0.78 02/12/2018 1354   CREATININE 0.74 01/15/2018 1409   CREATININE 0.8 08/14/2017 1042      Component Value Date/Time   CALCIUM 9.6 02/12/2018 1354   CALCIUM 9.6 08/14/2017 1042   ALKPHOS 95 02/12/2018 1354   ALKPHOS 84 08/14/2017 1042   AST 33 02/12/2018 1354   AST 35 (H) 01/15/2018 1409   AST 63 (H) 08/14/2017 1042   ALT 20 02/12/2018 1354   ALT 26 01/15/2018 1409   ALT 53  08/14/2017 1042   BILITOT 0.4 02/12/2018 1354   BILITOT 0.4 01/15/2018 1409   BILITOT 0.44 08/14/2017 1042       No results found for: LABCA2  No components found for: LABCA125  No results for input(s): INR in the last 168 hours.  Urinalysis    Component Value Date/Time   COLORURINE YELLOW 11/12/2015 [place] 11/12/2015 0837   LABSPEC 1.005 03/18/2017 1223   PHURINE 6.0 03/18/2017 1223   PHURINE 6.5 11/12/2015 0837   GLUCOSEU Negative 03/18/2017 1223   HGBUR Large 03/18/2017 1223   HGBUR MODERATE (A) 11/12/2015 0837   BILIRUBINUR Negative 03/18/2017 1223   KETONESUR Negative 03/18/2017 1223   [place] 11/12/2015 0837   PROTEINUR Negative 03/18/2017 1223   PROTEINUR NEGATIVE 11/12/2015 0837   UROBILINOGEN 0.2 03/18/2017 1223   NITRITE Negative 03/18/2017 1223   NITRITE NEGATIVE 11/12/2015 0837   LEUKOCYTESUR Moderate 03/18/2017 1223    STUDIES: Result Date: 03/10/2018 at [place] - : TECHNIQUE: Utilizing automatic exposure control, 5 mm axial noncontrast CT imaging performed  through the brain. FINDINGS: There is questionable focal hypodensity involving the cerebellar vermis. Moderate atrophy. Negative fracture axial collection, midline shift nor intracranial hemorrhage. CONCLUSIONS: Question vermian lesion versus acute stroke. Recommend further evaluation with contrast-enhanced MRI. Dictated By: Steven T Crawford, MD 03/10/2018 19:45 Electronically Signed by: Steven T Crawford, MD 03/10/2018 19:47       PROTOCOLS:  participated in BWEL, ABC and PALLAS studies, currently off study   ASSESSMENT: 57 y.o. [place] woman status post biopsy of 2 separate masses in the left breast upper outer quadrant 08/04/2015, clinically mT2 N1, stage IIb/IIIa invasive ductal carcinoma, grade 3, the larger mass being estrogen and progesterone receptor positive, HER-2 negative, with an MIB-1 of 12%  (a) biopsy of a left  axillary lymph node 08/09/2015 was positive, with extracapsular extension  (1) neoadjuvant chemotherapy started 08/25/2015 consisting of doxorubicin and cyclophosphamide in dose dense fashion 4, completed 10/06/2015,  followed by weekly paclitaxel 10 completed 12/23/2015  (a) final 2 planned cycles of weekly paclitaxel omitted because of neuropathy concerns  (2) status post left mastectomy with targeted axillary dissection 02/02/2016 for a ypT2 ypN2a, stage IIIa invasive ductal carcinoma, grade 2, with negative margins, repeat prognostic panel showing estrogen receptor positive, but progesterone receptor and HER-2 negative  (a) completion axillary lymph node dissection 02/14/2016 showed an additional 2 out of 8 lymph nodes to be involved (final count 6 out of 10 lymph nodes positive  (b) expander placement at the time of left mastectomy  (3) adjuvant capecitabine starting concurrently with radiation--discontinued 05/21/2016  (4) adjuvant  radiation started 04/02/2016, completed 05/21/2016  (5) anastrozole started October 2017, discontinued January 2019  (a) bone density August 2016 showed osteopenia  (b) repeat bone density 09/07/2016 shows a T score of -1.7.  (c) the patient is status post total abdominal hysterectomy with bilateral salpingo-oophorectomy.  (6) chronic pain: Starting OxyContin 20 mg twice a day with oxycodone as needed for breakthrough pain 03/02/2016  (a) failed transition to tramadol, gabapentin, naproxen   (b) started methadone 5 mg TID as of 12/20/2016-- not tolerated  (c) on OxyContin and Oxycodone as discussed below  (7) Research:  (a) B-WEL study ([facility] [id number])--enrolled in Arm 1 (received education)  (b) PALLAS AFT-05 study: randomized to treatment, started palbociclib 07/26/2016   (i) dose reduced to 100 mg, then to 75 mg because of cytopenias   (ii) went off study study May 2018 because of persistent cytopenias  (c) taxane study 12/20/2016  (d) aspirin  study  (e) went off studies as metastatic disease developed in June 2018  METASTATIC DISEASE 02/15/2017: CT scan of the lumbar spine and 02/27/2017 CT of the thoracic and cervical spine shows multiple bone lesions but no evidence of cord compromise; bone lesions are confirmed on PET scan 02/22/2017 which however shows no evidence of visceral disease  (a) CA-27-29 is noninformative  (b) left upper lobe changes noted on PET scan 02/22/2017 felt to be secondary to radiation  (c)repeat PET scan 09/05/2017 shows again no visceral disease, stable bony metastases  (d) PET scan 02/03/2018 shows no visceral disease, new bone lesions  (8) continued anastrozole, abemaciclib added 02/22/2017, taken 2 weeks on, 2 weeks off   (a) fulvestrant started 02/22/2017 ,   (b) anastrozole discontinued January 2019  (c) fulvestrant and abemaciclib discontinued June 2019 with progression  (9) yearly zolendronate given 09/26/2016, changed to monthly denosumab/Xgeva starting 03/19/2017  (10) radiation 04/01/2017 - 04/23/2017 Site/dose:   The lumbar spine was treated to 35 Gy in 14 fractions of   2.5 Gy.   (11) chronic pain: Currently on OxyContin 160 grams p.o. TID/ oxycodone 20-40 mg Q 4h PRN  (i) bowel prophylaxis in place  (ii) also followed by Dr. Paul Harkins   (12) exemestane started 02/12/2018  (a) everolimus to be added 03/12/2018   PLAN: Alison [name] likely had a near heatstroke while at the beach to explain her semi-syncopal episode on 03/10/2018.  However the head CT there, noncontrast, suggested a possible lesion in her vermis.  Somehow this was read as normal by the local ED physician so he was not followed up on.  Aside from that she has clear symptoms of vertigo.  This may be unrelated to any brain mets and is certainly not related to her everolimus, which she never started, or her exemestane, which does not cause the symptoms.  Alison [name] is on multiple pain medications, but there has not been a  significant change lately and check with PMP aware shows no significant deviations or new findings.  I am setting her up for a brain MRI and also for physical therapy so she can go through the Epple's maneuver.  Hopefully we can get this done within the next 2 days.  Once this is done and she is feeling a bit better she can resume the exemestane and start the everolimus.  Will receive Xgeva today.  In any case I am going to see her again on 04/04/2018 to make sure she is tolerating her treatment well.  Magrinat, Gustav C, MD  03/19/18 8:22 AM Medical Oncology and Hematology [facility] [address] [place], [place] [address] Tel. [phone]    Fax. [phone]    I, Soijett Blue am acting as scribe for Dr. Gustav C. Magrinat.  I, Gustav Magrinat MD, have reviewed the above documentation for accuracy and completeness, and I agree with the above.  ADDENDUM: On the way to scheduling the patient fell.  She did not lose consciousness.  Exam remained nonfocal.  She is not aware of having hurt herself.  She is being taken to the emergency room for further evaluation and I have called the ED to let her know the details of her current situation.         

## 2018-03-19 ENCOUNTER — Encounter: Payer: Self-pay | Admitting: Oncology

## 2018-03-19 ENCOUNTER — Encounter (HOSPITAL_COMMUNITY): Payer: Self-pay

## 2018-03-19 ENCOUNTER — Inpatient Hospital Stay: Payer: 59

## 2018-03-19 ENCOUNTER — Inpatient Hospital Stay: Payer: 59 | Attending: Oncology

## 2018-03-19 ENCOUNTER — Inpatient Hospital Stay (HOSPITAL_BASED_OUTPATIENT_CLINIC_OR_DEPARTMENT_OTHER): Payer: 59 | Admitting: Oncology

## 2018-03-19 ENCOUNTER — Ambulatory Visit
Admit: 2018-03-19 | Discharge: 2018-03-19 | Disposition: A | Discharge status: Home / Self Care | Payer: 59 | Source: Ambulatory Visit | Attending: Radiation Oncology | Admitting: Radiation Oncology

## 2018-03-19 ENCOUNTER — Other Ambulatory Visit: Payer: Self-pay

## 2018-03-19 ENCOUNTER — Other Ambulatory Visit: Payer: Self-pay | Admitting: Oncology

## 2018-03-19 ENCOUNTER — Encounter: Payer: Self-pay | Admitting: Radiation Oncology

## 2018-03-19 ENCOUNTER — Emergency Department (HOSPITAL_COMMUNITY): Payer: 59

## 2018-03-19 ENCOUNTER — Inpatient Hospital Stay (HOSPITAL_COMMUNITY)
Admission: EM | Admit: 2018-03-19 | Discharge: 2018-03-22 | DRG: 054 | Disposition: A | Discharge status: Home Health | Payer: 59 | Attending: Internal Medicine | Admitting: Internal Medicine

## 2018-03-19 VITALS — BP 174/98 | HR 100 | Temp 98.8°F | Resp 18 | Ht 62.0 in | Wt 151.9 lb

## 2018-03-19 DIAGNOSIS — Z9012 Acquired absence of left breast and nipple: Secondary | ICD-10-CM

## 2018-03-19 DIAGNOSIS — Z170 Estrogen receptor positive status [ER+]: Secondary | ICD-10-CM | POA: Diagnosis not present

## 2018-03-19 DIAGNOSIS — F419 Anxiety disorder, unspecified: Secondary | ICD-10-CM | POA: Insufficient documentation

## 2018-03-19 DIAGNOSIS — C50412 Malignant neoplasm of upper-outer quadrant of left female breast: Secondary | ICD-10-CM

## 2018-03-19 DIAGNOSIS — M8580 Other specified disorders of bone density and structure, unspecified site: Secondary | ICD-10-CM | POA: Insufficient documentation

## 2018-03-19 DIAGNOSIS — Z882 Allergy status to sulfonamides status: Secondary | ICD-10-CM

## 2018-03-19 DIAGNOSIS — Z923 Personal history of irradiation: Secondary | ICD-10-CM | POA: Insufficient documentation

## 2018-03-19 DIAGNOSIS — C779 Secondary and unspecified malignant neoplasm of lymph node, unspecified: Secondary | ICD-10-CM

## 2018-03-19 DIAGNOSIS — C7931 Secondary malignant neoplasm of brain: Principal | ICD-10-CM | POA: Diagnosis present

## 2018-03-19 DIAGNOSIS — Z801 Family history of malignant neoplasm of trachea, bronchus and lung: Secondary | ICD-10-CM | POA: Diagnosis not present

## 2018-03-19 DIAGNOSIS — Z79899 Other long term (current) drug therapy: Secondary | ICD-10-CM

## 2018-03-19 DIAGNOSIS — C7951 Secondary malignant neoplasm of bone: Secondary | ICD-10-CM | POA: Diagnosis present

## 2018-03-19 DIAGNOSIS — Z79811 Long term (current) use of aromatase inhibitors: Secondary | ICD-10-CM

## 2018-03-19 DIAGNOSIS — T451X5A Adverse effect of antineoplastic and immunosuppressive drugs, initial encounter: Secondary | ICD-10-CM | POA: Diagnosis present

## 2018-03-19 DIAGNOSIS — R42 Dizziness and giddiness: Secondary | ICD-10-CM | POA: Insufficient documentation

## 2018-03-19 DIAGNOSIS — G8929 Other chronic pain: Secondary | ICD-10-CM

## 2018-03-19 DIAGNOSIS — C50912 Malignant neoplasm of unspecified site of left female breast: Secondary | ICD-10-CM

## 2018-03-19 DIAGNOSIS — Z9221 Personal history of antineoplastic chemotherapy: Secondary | ICD-10-CM

## 2018-03-19 DIAGNOSIS — Z90722 Acquired absence of ovaries, bilateral: Secondary | ICD-10-CM | POA: Diagnosis not present

## 2018-03-19 DIAGNOSIS — I10 Essential (primary) hypertension: Secondary | ICD-10-CM

## 2018-03-19 DIAGNOSIS — Z515 Encounter for palliative care: Secondary | ICD-10-CM | POA: Diagnosis not present

## 2018-03-19 DIAGNOSIS — R51 Headache: Secondary | ICD-10-CM | POA: Diagnosis not present

## 2018-03-19 DIAGNOSIS — Z87891 Personal history of nicotine dependence: Secondary | ICD-10-CM | POA: Insufficient documentation

## 2018-03-19 DIAGNOSIS — G620 Drug-induced polyneuropathy: Secondary | ICD-10-CM | POA: Diagnosis present

## 2018-03-19 DIAGNOSIS — Z90710 Acquired absence of both cervix and uterus: Secondary | ICD-10-CM | POA: Diagnosis not present

## 2018-03-19 DIAGNOSIS — G893 Neoplasm related pain (acute) (chronic): Secondary | ICD-10-CM

## 2018-03-19 DIAGNOSIS — G936 Cerebral edema: Secondary | ICD-10-CM | POA: Diagnosis present

## 2018-03-19 DIAGNOSIS — W010XXA Fall on same level from slipping, tripping and stumbling without subsequent striking against object, initial encounter: Secondary | ICD-10-CM | POA: Diagnosis present

## 2018-03-19 DIAGNOSIS — Z85828 Personal history of other malignant neoplasm of skin: Secondary | ICD-10-CM | POA: Insufficient documentation

## 2018-03-19 DIAGNOSIS — Z7189 Other specified counseling: Secondary | ICD-10-CM | POA: Diagnosis not present

## 2018-03-19 DIAGNOSIS — C773 Secondary and unspecified malignant neoplasm of axilla and upper limb lymph nodes: Secondary | ICD-10-CM | POA: Insufficient documentation

## 2018-03-19 DIAGNOSIS — F329 Major depressive disorder, single episode, unspecified: Secondary | ICD-10-CM | POA: Diagnosis present

## 2018-03-19 DIAGNOSIS — H532 Diplopia: Secondary | ICD-10-CM | POA: Diagnosis present

## 2018-03-19 DIAGNOSIS — R221 Localized swelling, mass and lump, neck: Secondary | ICD-10-CM | POA: Diagnosis present

## 2018-03-19 LAB — COMPREHENSIVE METABOLIC PANEL
ALBUMIN: 3.7 g/dL (ref 3.5–5.0)
ALK PHOS: 70 U/L (ref 38–126)
ALT: 19 U/L (ref 0–44)
ANION GAP: 10 (ref 5–15)
AST: 25 U/L (ref 15–41)
BILIRUBIN TOTAL: 0.4 mg/dL (ref 0.3–1.2)
BUN: 9 mg/dL (ref 6–20)
CALCIUM: 8.8 mg/dL — AB (ref 8.9–10.3)
CO2: 26 mmol/L (ref 22–32)
Chloride: 103 mmol/L (ref 98–111)
Creatinine, Ser: 0.69 mg/dL (ref 0.44–1.00)
GFR calc Af Amer: >60 mL/min (ref >60)
GLUCOSE: 115 mg/dL — AB (ref 70–99)
Potassium: 3.9 mmol/L (ref 3.5–5.1)
Sodium: 139 mmol/L (ref 135–145)
TOTAL PROTEIN: 6.9 g/dL (ref 6.5–8.1)

## 2018-03-19 LAB — CBC WITH DIFFERENTIAL/PLATELET
BASOS PCT: 1 %
Basophils Absolute: 0.1 10*3/uL (ref 0.0–0.1)
Eosinophils Absolute: 0.2 10*3/uL (ref 0.0–0.5)
Eosinophils Relative: 5 %
HEMATOCRIT: 36.8 % (ref 34.8–46.6)
HEMOGLOBIN: 12.7 g/dL (ref 11.6–15.9)
LYMPHS ABS: 0.7 10*3/uL — AB (ref 0.9–3.3)
Lymphocytes Relative: 15 %
MCH: 34 pg (ref 25.1–34.0)
MCHC: 34.5 g/dL (ref 31.5–36.0)
MCV: 98.6 fL (ref 79.5–101.0)
MONOS PCT: 9 %
Monocytes Absolute: 0.4 10*3/uL (ref 0.1–0.9)
NEUTROS PCT: 70 %
Neutro Abs: 3 10*3/uL (ref 1.5–6.5)
Platelets: 216 10*3/uL (ref 145–400)
RBC: 3.73 MIL/uL (ref 3.70–5.45)
RDW: 14.8 % — ABNORMAL HIGH (ref 11.2–14.5)
WBC: 4.4 10*3/uL (ref 3.9–10.3)

## 2018-03-19 MED ORDER — IBUPROFEN 200 MG PO TABS
400.0000 mg | ORAL_TABLET | Freq: Four times a day (QID) | ORAL | Status: DC | PRN
  Administered 2018-03-21 – 2018-03-22 (×2): 400 mg via ORAL
  Filled 2018-03-19 (×2): qty 2

## 2018-03-19 MED ORDER — HYDRALAZINE HCL 20 MG/ML IJ SOLN: 10.0000 mg | Freq: Four times a day (QID) | INTRAMUSCULAR | Status: DC | PRN

## 2018-03-19 MED ORDER — DENOSUMAB 120 MG/1.7ML ~~LOC~~ SOLN
SUBCUTANEOUS | Status: AC
  Filled 2018-03-19: qty 1.7

## 2018-03-19 MED ORDER — GABAPENTIN 400 MG PO CAPS
1200.0000 mg | ORAL_CAPSULE | Freq: Every day | ORAL | Status: DC
  Administered 2018-03-19 – 2018-03-21 (×3): 1200 mg via ORAL
  Filled 2018-03-19 (×3): qty 3

## 2018-03-19 MED ORDER — ACETAMINOPHEN 650 MG RE SUPP: 650.0000 mg | Freq: Four times a day (QID) | RECTAL | Status: DC | PRN

## 2018-03-19 MED ORDER — PROCHLORPERAZINE EDISYLATE 10 MG/2ML IJ SOLN
10.0000 mg | Freq: Four times a day (QID) | INTRAMUSCULAR | Status: DC | PRN
  Administered 2018-03-19: 10 mg via INTRAVENOUS
  Filled 2018-03-19: qty 2

## 2018-03-19 MED ORDER — TRAZODONE HCL 50 MG PO TABS
150.0000 mg | ORAL_TABLET | Freq: Every day | ORAL | Status: DC
  Administered 2018-03-19 – 2018-03-21 (×3): 150 mg via ORAL
  Filled 2018-03-19 (×3): qty 3

## 2018-03-19 MED ORDER — OXYCODONE HCL 5 MG PO TABS
20.0000 mg | ORAL_TABLET | ORAL | Status: DC | PRN
  Administered 2018-03-19 – 2018-03-21 (×4): 20 mg via ORAL
  Filled 2018-03-19 (×5): qty 4

## 2018-03-19 MED ORDER — HYDROMORPHONE HCL 1 MG/ML IJ SOLN
0.5000 mg | Freq: Once | INTRAMUSCULAR | Status: AC
  Administered 2018-03-19: 0.5 mg via INTRAVENOUS
  Filled 2018-03-19: qty 1

## 2018-03-19 MED ORDER — GADOBENATE DIMEGLUMINE 529 MG/ML IV SOLN
15.0000 mL | Freq: Once | INTRAVENOUS | Status: AC | PRN
  Administered 2018-03-19: 14 mL via INTRAVENOUS

## 2018-03-19 MED ORDER — ACETAMINOPHEN 325 MG PO TABS
650.0000 mg | ORAL_TABLET | Freq: Four times a day (QID) | ORAL | Status: DC | PRN
  Administered 2018-03-21: 650 mg via ORAL
  Filled 2018-03-19: qty 2

## 2018-03-19 MED ORDER — OXYCODONE HCL ER 40 MG PO T12A
80.0000 mg | EXTENDED_RELEASE_TABLET | Freq: Three times a day (TID) | ORAL | Status: DC
  Administered 2018-03-19 – 2018-03-21 (×5): 80 mg via ORAL
  Filled 2018-03-19 (×3): qty 2
  Filled 2018-03-19: qty 8
  Filled 2018-03-19: qty 2

## 2018-03-19 MED ORDER — AMLODIPINE BESYLATE 5 MG PO TABS
5.0000 mg | ORAL_TABLET | Freq: Every day | ORAL | Status: DC
  Administered 2018-03-19 – 2018-03-22 (×4): 5 mg via ORAL
  Filled 2018-03-19 (×4): qty 1

## 2018-03-19 MED ORDER — SODIUM CHLORIDE 0.9 % IV BOLUS
500.0000 mL | Freq: Once | INTRAVENOUS | Status: AC
  Administered 2018-03-19: 500 mL via INTRAVENOUS

## 2018-03-19 MED ORDER — DEXAMETHASONE 4 MG PO TABS
4.0000 mg | ORAL_TABLET | Freq: Once | ORAL | Status: AC
  Administered 2018-03-19: 4 mg via ORAL
  Filled 2018-03-19: qty 1

## 2018-03-19 MED ORDER — DEXAMETHASONE SODIUM PHOSPHATE 10 MG/ML IJ SOLN
4.0000 mg | Freq: Four times a day (QID) | INTRAMUSCULAR | Status: DC
  Administered 2018-03-19 – 2018-03-22 (×12): 4 mg via INTRAVENOUS
  Filled 2018-03-19 (×12): qty 1

## 2018-03-19 MED ORDER — ONDANSETRON HCL 4 MG/2ML IJ SOLN
4.0000 mg | Freq: Once | INTRAMUSCULAR | Status: AC
  Administered 2018-03-19: 4 mg via INTRAVENOUS
  Filled 2018-03-19: qty 2

## 2018-03-19 MED ORDER — ONDANSETRON HCL 4 MG/2ML IJ SOLN
4.0000 mg | Freq: Four times a day (QID) | INTRAMUSCULAR | Status: DC | PRN
  Filled 2018-03-19: qty 2

## 2018-03-19 MED ORDER — OXYCODONE HCL 20 MG PO TABS: 20.0000 mg | ORAL_TABLET | ORAL | 0 refills | Status: DC | PRN

## 2018-03-19 MED ORDER — GABAPENTIN 300 MG PO CAPS
600.0000 mg | ORAL_CAPSULE | Freq: Two times a day (BID) | ORAL | Status: DC
  Administered 2018-03-20 – 2018-03-22 (×5): 600 mg via ORAL
  Filled 2018-03-19 (×5): qty 2

## 2018-03-19 MED ORDER — LORAZEPAM 0.5 MG PO TABS
0.5000 mg | ORAL_TABLET | Freq: Every day | ORAL | Status: DC
  Administered 2018-03-19 – 2018-03-22 (×3): 0.5 mg via ORAL
  Filled 2018-03-19 (×3): qty 1

## 2018-03-19 MED ORDER — ONDANSETRON HCL 4 MG PO TABS: 4.0000 mg | ORAL_TABLET | Freq: Four times a day (QID) | ORAL | Status: DC | PRN

## 2018-03-19 NOTE — H&P (Signed)
History and Physical    Alison [name] JXB:[id number] DOB: 1961-04-01 DOA: 03/19/2018    PCP: [name], MD  Patient coming from: home  Chief Complaint: dizziness and falls  HPI: Alison [name] is a 57 y.o. 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  She was at the beach about a wk ago and had some dizziness and a fall. She states the scan of her head was "negative". She went to see Dr [name] today for a follow up and told him about her dizziness which he panned to have worked up with an MRI and Museum/gallery exhibitions officer. On hr way to the parking lot, she continued to feel the spinning, dizziness and fell. She did not pass out. She has some posterior neck pain for which a CT was done in the ED.  Today she tells me that she feels like things are spinning when she tries to walk and has been having to hold on to things. She has had no visual changes. She feels nauseated but has not vomited. She has been having severe headaches as well and takes Oxycodone for pain.   I noticed that her neck appears swollen and the patient and son feel it is more swollen than it has been in the past.  ED Course: MRI reveals> Extensive abnormal leptomeningeal enhancement with greatest involvement of the posterior fossa most compatible with metastatic disease. Cerebellar edema with partial fourth ventricle effacement. No evidence of obstructive hydrocephalus at this time. 2. Extensive osseous metastases. CT C spine shows osseous metastasis and no significant stenosis  Review of Systems:  chronic pain tends to hurt in her back and upper chest Insomnia All other systems reviewed and apart from HPI, are negative.  Past Medical History:  Diagnosis Date  . Anxiety   . Asthma     seasonal asthma  . Breast cancer ([place])    metastatic breast cancer  . Complication of anesthesia 2007   aspiration pneumonia post hysterectomy.  Blood pressure would drop low- but no problems 01/2016- surgery  . History of blood transfusion   . History of mitral valve prolapse 1988   with palpitations  . History of radiation therapy 04/02/16- 05/21/16   Left Chest Wall, SCV, Axilla, IM nodes  . Hypertension    no meds  . Neuropathy    with chemo  . Osteopenia determined by x-ray   . Pneumonia    1980's and 1990's  . Raynaud's disease   . 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 Skin cancer 03/12/2014   basal- back    Past Surgical History:  Procedure Laterality Date  . ABDOMINAL HYSTERECTOMY  2007   with BSO  . APPENDECTOMY  2007  . AXILLARY LYMPH NODE DISSECTION Left 02/14/2016   Procedure: LEFT AXILLARY LYMPH NODE DISSECTION;  Surgeon: [name], MD;  Location: [place];  Service: General;  Laterality: Left;  . BREAST RECONSTRUCTION WITH PLACEMENT OF TISSUE EXPANDER AND FLEX HD (ACELLULAR HYDRATED DERMIS) Left 02/02/2016   Procedure: IMMEDIATE LEFT BREAST RECONSTRUCTION WITH PLACEMENT OF TISSUE EXPANDER AND FLEX HD (ACELLULAR HYDRATED DERMIS);  Surgeon: [name], DO;  Location: [place];  Service: Plastics;  Laterality: Left;  . BREAST REDUCTION SURGERY Right 03/28/2017   Procedure: RIGHT MAMMARY REDUCTION  (BREAST);  Surgeon: [name], DO;  Location: [place];  Service:  Plastics;  Laterality: Right;  . BREAST SURGERY Left 02/02/16   mastectomy with reconstruction  . c-section  1991  . COLON RESECTION SIGMOID  12/02/2012   [facility]   . HERNIA REPAIR  2007  . LAPAROSCOPIC ENDOMETRIOSIS FULGURATION  1984, 1989  . MASTOPEXY Right 03/28/2017   Procedure: MASTOPEXY;  Surgeon: [name], DO;  Location: [place];  Service: Plastics;  Laterality: Right;  . Port- a- cath placement  07/2015  . PORT-A-CATH REMOVAL Right 02/02/2016   Procedure: REMOVAL PORT-A-CATH;  Surgeon: [name],  MD;  Location: [place];  Service: General;  Laterality: Right;  . RADIOACTIVE SEED GUIDED AXILLARY SENTINEL LYMPH NODE Left 02/02/2016   Procedure: LEFT BREAST MASTECTOMY AND RADIOACTIVE SEED GUIDED LEFT SENTINEL LYMPH NODE BIOPSY ;  Surgeon: [name], MD;  Location: [facility];  Service: General;  Laterality: Left;  . REMOVAL OF TISSUE EXPANDER AND PLACEMENT OF IMPLANT Left 03/28/2017   Procedure: REMOVAL OF TISSUE EXPANDER AND PLACEMENT OF IMPLANT;  Surgeon: [name], DO;  Location: [place];  Service: Plastics;  Laterality: Left;    Social History:   reports that she quit smoking about 9 years ago. Her smoking use included cigarettes. She has a 10.00 pack-year smoking history. She has never used smokeless tobacco. She reports that she drinks alcohol. She reports that she does not use drugs.  Lives with husband.  Allergies  Allergen Reactions  . Sulfa Antibiotics Other (See Comments)    Blisters in mouth    Family History  Problem Relation Age of Onset  . Lung cancer Father      Prior to Admission medications   Medication Sig Start Date End Date Taking? Authorizing Provider  amLODipine (NORVASC) 5 MG tablet Take 5 mg by mouth daily.   Yes [provider]  Ascorbic Acid (VITAMIN C) 1000 MG tablet Take 1,000 mg by mouth 2 (two) times daily.   Yes [provider]  carisoprodol (SOMA) 350 MG tablet Take 350 mg by mouth 3 (three) times daily.   Yes [provider]  cetirizine (ZYRTEC) 10 MG tablet Take 10 mg by mouth daily.   Yes [provider]  Cholecalciferol (VITAMIN D3) 10000 units capsule Take 10,000 Units by mouth daily.    Yes [provider]  escitalopram (LEXAPRO) 20 MG tablet Take 1 tablet (20 mg total) by mouth daily. 08/14/17  Yes Causey, Charlestine Massed, NP  everolimus (AFINITOR) 10 MG tablet Take 1 tablet (10 mg total) by mouth daily. 02/12/18  Yes Magrinat, Virgie Dad,  MD  exemestane (AROMASIN) 25 MG tablet Take 1 tablet (25 mg total) by mouth daily after breakfast. 02/12/18  Yes Magrinat, Virgie Dad, MD  fluticasone (FLONASE) 50 MCG/ACT nasal spray Place 2 sprays into both nostrils daily. Reported on 11/18/2015   Yes [provider]  gabapentin (NEURONTIN) 300 MG capsule Take 4 capsules (1,200 mg total) by mouth at bedtime. TAKE 2 CAPSULES IN THE MORNING AND TAKE 2 CAPSULES MID-DAY AND TAKE 4CAPSULES AT BEDTIME 02/12/18  Yes Magrinat, Virgie Dad, MD  LORazepam (ATIVAN) 0.5 MG tablet Take 1 tablet (0.5 mg total) by mouth at bedtime. 10/03/17  Yes Magrinat, Virgie Dad, MD  Multiple Vitamin (MULTIVITAMIN) tablet Take 1 tablet by mouth daily.   Yes [provider]  ondansetron (ZOFRAN) 8 MG tablet Take 1 tablet (8 mg total) by mouth every 8 (eight) hours as needed for nausea or vomiting. 07/16/17  Yes Wilber Bihari  Cornetto, NP  oxyCODONE (OXYCONTIN) 80 mg 12 hr tablet Take 2 tablets (160 mg total) by mouth every 8 (eight) hours. 02/26/18  Yes Magrinat, Virgie Dad, MD  Oxycodone HCl 20 MG TABS Take 1-2 tablets (20-40 mg total) by mouth every 4 (four) hours as needed. 03/19/18  Yes Magrinat, Virgie Dad, MD  Probiotic Product (PROBIOTIC DAILY PO) Take 1 capsule by mouth daily. [place]   Yes [provider]  prochlorperazine (COMPAZINE) 10 MG tablet Take 1 tablet (10 mg total) by mouth every 6 (six) hours as needed (Nausea or vomiting). 08/14/17  Yes Causey, Charlestine Massed, NP  traZODone (DESYREL) 50 MG tablet TAKE 3 TABLETS AT BEDTIME 01/22/18  Yes Magrinat, Virgie Dad, MD  UNABLE TO FIND [facility]   Yes [provider]  UNABLE TO FIND [place]   Yes [provider]  UNABLE TO [place]   Yes [provider]  zolpidem (AMBIEN) 5 MG tablet Take 1 tablet (5 mg total) by mouth at bedtime as needed for sleep. for sleep 10/18/17  Yes Magrinat, Virgie Dad, MD  dexamethasone (DECADRON)  0.5 MG/5ML solution Swish 60mL in mouth for 34min and spit out. Use 4 times daily for 8 weeks, start with Afinitor. Avoid eating/drinking for 1hr after rinse. 02/21/18   Magrinat, Virgie Dad, MD    Physical Exam: Wt Readings from Last 3 Encounters:  03/19/18 68.5 kg (151 lb)  03/19/18 68.9 kg (151 lb 14.4 oz)  02/12/18 71.3 kg (157 lb 1.6 oz)   Vitals:   03/19/18 0900 03/19/18 0930 03/19/18 1001 03/19/18 1030  BP:  (!) 171/91 (!) 160/84 (!) 171/99  Pulse:  83 96 (!) 103  Resp:  19  17  Temp:      TempSrc:      SpO2:  90% 92% 93%  Weight: 68.5 kg (151 lb)     Height: 5\' 2"  (1.575 m)         Constitutional: NAD, calm, comfortable Eyes: PERTLA, lids and conjunctivae normal ENMT: Mucous membranes are moist. Posterior pharynx clear of any exudate or lesions. Normal dentition.  Neck: supple, no masses, no thyromegaly- extensive swelling under chin and in  Neck- non tender Respiratory: clear to auscultation bilaterally, no wheezing, no crackles. Normal respiratory effort. No accessory muscle use.  Cardiovascular: S1 & S2 heard, regular rate and rhythm, no murmurs / rubs / gallops. No extremity edema. 2+ pedal pulses. No carotid bruits.  Abdomen: No distension, no tenderness, no masses palpated. No hepatosplenomegaly. Bowel sounds normal.  Musculoskeletal: no clubbing / cyanosis. No joint deformity upper and lower extremities. Good ROM, no contractures. Normal muscle tone.  Skin: no rashes, lesions, ulcers. No induration Neurologic: CN 2-12 grossly intact. Sensation intact, DTR normal. Strength 5/5 in all 4 limbs.  Psychiatric: Normal judgment and insight. Alert and oriented x 3. Normal mood.     Labs on Admission: I have personally reviewed following labs and imaging studies  CBC: Recent Labs  Lab 03/19/18 0754  WBC 4.4  NEUTROABS 3.0  HGB 12.7  HCT 36.8  MCV 98.6  PLT 694   Basic Metabolic Panel: Recent Labs  Lab 03/19/18 0754  NA 139  K 3.9  CL 103  CO2 26  GLUCOSE  115*  BUN 9  CREATININE 0.69  CALCIUM 8.8*   GFR: Estimated Creatinine Clearance: 69.6 mL/min (by C-G formula based on SCr of 0.69 mg/dL). Liver Function Tests: Recent Labs  Lab 03/19/18 [phone]  AST 25  ALT 19  ALKPHOS 70  BILITOT 0.4  PROT 6.9  ALBUMIN 3.7   No results for input(s): LIPASE, AMYLASE in the last 168 hours. No results for input(s): AMMONIA in the last 168 hours. Coagulation Profile: No results for input(s): INR, PROTIME in the last 168 hours. Cardiac Enzymes: No results for input(s): CKTOTAL, CKMB, CKMBINDEX, TROPONINI in the last 168 hours. BNP (last 3 results) No results for input(s): PROBNP in the last 8760 hours. HbA1C: No results for input(s): HGBA1C in the last 72 hours. CBG: No results for input(s): GLUCAP in the last 168 hours. Lipid Profile: No results for input(s): CHOL, HDL, LDLCALC, TRIG, CHOLHDL, LDLDIRECT in the last 72 hours. Thyroid Function Tests: No results for input(s): TSH, T4TOTAL, FREET4, T3FREE, THYROIDAB in the last 72 hours. Anemia Panel: No results for input(s): VITAMINB12, FOLATE, FERRITIN, TIBC, IRON, RETICCTPCT in the last 72 hours. Urine analysis:    Component Value Date/Time   COLORURINE YELLOW 11/12/2015 0837   APPEARANCEUR CLEAR 11/12/2015 0837   LABSPEC 1.005 03/18/2017 1223   PHURINE 6.0 03/18/2017 1223   PHURINE 6.5 11/12/2015 0837   GLUCOSEU Negative 03/18/2017 1223   HGBUR Large 03/18/2017 1223   HGBUR MODERATE (A) 11/12/2015 0837   BILIRUBINUR Negative 03/18/2017 1223   KETONESUR Negative 03/18/2017 1223   KETONESUR NEGATIVE 11/12/2015 0837   PROTEINUR Negative 03/18/2017 1223   PROTEINUR NEGATIVE 11/12/2015 0837   UROBILINOGEN 0.2 03/18/2017 1223   NITRITE Negative 03/18/2017 1223   NITRITE NEGATIVE 11/12/2015 0837   LEUKOCYTESUR Moderate 03/18/2017 1223   Sepsis Labs: @LABRCNTIP (procalcitonin:4,lacticidven:4) )No results found for this or any previous visit (from the past 240 hour(s)).   Radiological  Exams on Admission: Ct Cervical Spine Wo Contrast  Result Date: 03/19/2018 CLINICAL DATA:  Neck pain after a fall today. Near syncopal episode. Headache. Vertigo for 2 weeks. History of metastatic breast cancer. EXAM: CT CERVICAL SPINE WITHOUT CONTRAST TECHNIQUE: Multidetector CT imaging of the cervical spine was performed without intravenous contrast. Multiplanar CT image reconstructions were also generated. COMPARISON:  PET-CT 02/03/2018 and cervical spine CT 02/26/2017 FINDINGS: Alignment: Chronic mild reversal of the normal cervical lordosis. Unchanged slight anterolisthesis of C4 on C5 and C5 on C6. Skull base and vertebrae: Widespread, predominantly sclerotic osseous metastases have progressed from the 2018 cervical spine CT but are similar to the more recent PET-CT. No acute fracture is identified. Soft tissues and spinal canal: No gross canal hematoma or epidural tumor on this unenhanced study. No prevertebral fluid. Disc levels: Similar degenerative changes compared to the 2018 study. Mild disc space narrowing and spurring at C5-6 and C6-7. Multilevel facet arthrosis, most severe on the left at C4-5 resulting in mild left neural foraminal stenosis. No high-grade osseous neural foraminal or spinal canal stenosis identified. Upper chest: Clear lung apices. Other: Mild right and moderate left carotid bifurcation calcified atherosclerosis. IMPRESSION: 1. No acute osseous abnormality identified in the cervical spine. 2. Known widespread osseous metastatic disease. Electronically Signed   By: Logan Bores M.D.   On: 03/19/2018 11:17   Mr Jeri Cos And Wo Contrast  Result Date: 03/19/2018 CLINICAL DATA:  Recent near syncopal episode. Possible cerebellar lesion on outside head CT. Vertigo. Fall today. History of breast cancer with osseous metastases. EXAM: MRI HEAD WITHOUT AND WITH CONTRAST TECHNIQUE: Multiplanar, multiecho pulse sequences of the brain and surrounding structures were obtained without and with  intravenous contrast. CONTRAST:  35mL MULTIHANCE GADOBENATE DIMEGLUMINE 529 MG/ML IV SOLN COMPARISON:  Head CT report from [place]  [facility] dated 03/10/2018 FINDINGS: Some sequences are mildly motion degraded. Brain: There is edema throughout the superior and medial cerebellar hemispheres bilaterally with involvement of the vermis, posterior pons, and tectum. 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 There is also leptomeningeal enhancement in the left occipital lobe and interpeduncular cistern. Masslike extra-axial enhancement in the pineal region measures 2.1 x 1.2 cm. There is likely a small amount of leptomeningeal enhancement along the ventral pons and medulla, and there is abnormal enhancement along the left seventh and eighth cranial nerve complex in the internal auditory canal. There is also abnormal enhancement involving Meckel's cave on the left and likely involving the cisternal segment of the left trigeminal. T2 shine through is noted in the cerebellum on diffusion imaging. No definite acute infarct is identified. There is no evidence of intracranial hemorrhage, midline shift, or extra-axial fluid collection. Posterior fossa edema partially effaces the fourth ventricle. The lateral and third ventricles are normal in size without evidence of obstructive hydrocephalus. A few small foci of cerebral white matter T2 hyperintensity are nonspecific. Vascular: Major intracranial vascular flow voids are preserved. Skull and upper cervical spine: Known widespread osseous metastatic disease involving the skull and included upper cervical spine. Extensive skull base involvement including of the clivus. Sinuses/Orbits: Unremarkable orbits. Clear paranasal sinuses. Trace left mastoid effusion. Other: None. IMPRESSION: 1. Extensive abnormal leptomeningeal enhancement with greatest involvement of the posterior fossa most compatible with  metastatic disease. Cerebellar edema with partial fourth ventricle effacement. No evidence of obstructive hydrocephalus at this time. 2. Extensive osseous metastases. Electronically Signed   By: Logan Bores M.D.   On: 03/19/2018 12:36      Assessment/Plan Principal Problem:   Brain metastasis -  Bone metastases    Malignant neoplasm of upper-outer quadrant of left breast in female, estrogen receptor positive  - Brain mets likely the cause of the vertigo and falls although her orthostatic vitals are + as well in the ED- given 500 cc bolus in ED - start Decadron - Rad onc consulted - I have texted Dr Mickeal Skinner, Neuro/onc- he is out of town but is aware of her and will see her when he returns - PT eval - Everoliums and Exemestane noted on home med list- she states that she is not supposed to start these yet  Active Problems:  Headaches  - add Tylenol and Motrin- aready on Oxycodone  Neck swelling- hard to tell if this is new  - no lymphadenopathy noted in neck on past CT- follow    HTN (hypertension) - cont Norvasc    Chronic pain  - she has a number pain meds on her list and seems confused about what she actually takes- she is only aware that she takes Oxycodone 20 mg every 4 hrs as needed (and this bottle is actually with her) and Neurontin 2 tabs in AM and 4 in PM - she may take 80 mg or 160 mg of Oxycontin BID- she is not sure - I have not resumed this until I get pharmacy confirmation that this is accurate- her son is bringing in the bottle now - hold Soma until she has the bottle to prove she is on it - NOTE: avoid Tramadol as brain mets already place her at high risk for seizures  Depression - cont Lexapro  Insomnia - cont Trazodone and Ativan hich she is able to confirm    DVT prophylaxis: SCDs Code Status: Full code Family Communication:  son at bedside  Disposition Plan: likely will need radiation  Consults called: rad/onc already consulted  Admission status:  inpatient    Debbe Odea MD [facility] Pager: [email or web address] Password [email or web address] 7PM-7AM, please contact night-coverage   03/19/2018, 2:27 PM

## 2018-03-19 NOTE — Consult Note (Signed)
Radiation Oncology         [phone] ________________________________  Inpatient Re-Consultation  Name: Alison [name] MRN: [id number]  Date: 03/19/2018  DOB: [birth year]  MW:[id number], Fransico Him, MD  No ref. provider found   REFERRING PHYSICIAN:  Dr Lurline Del  DIAGNOSIS:    ICD-10-CM   1. 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 female who has been admitted to the hospital following her appointment with Dr. [name] this morning. She had been seen in the hospital in [place] during vacation on 03/10/18. She reports experiencing vertigo symptoms-- light-headedness, nausea, double vision. CT in [place] of head showed a possible lesion in the vermis.  Of note, on 02/03/18, a PET scan revealed: largely stable hypermetabolic bone metastases (multiple sites)  A CT cervical spine without contrast was performed today, 03/19/18, revealing: no acute osseous abnormality identified in the cervical spine; known widespread osseous metastatic disease.  A MRI brain with and without contrast also performed today, 03/19/18, showed: extensive abnormal leptomeningeal enhancement with greatest involvement of the posterior fossa most compatible with metastatic disease, cerebellar edema with partial fourth ventricle effacement, no evidence of obstructive hydrocephalus at this time; extensive osseous metastases. I have reviewed her images  She reports headaches, nausea, blurring and double vision, ringing (and "whooshing," static-like sounds) in her ears. She also reports weakness and trouble walking, feeling like her legs are "un-attached" for the last 3 weeks. Stable Lumbar back pain.  PREVIOUS RADIATION THERAPY: Yes 04/01/2017 - 04/23/2017 - The L-S spine was treated to 35 Gy in 14 fractions of 2.5 Gy.  04/02/2016-05/21/2016-   1) Left Chest Wall, SCV, Axilla, IM nodes /  50.4Gy in 28 fractions (IM nodes receiving 45Gy+) ; 2) Left Chest wall scar boost / 10 Gy in 5 fractions  PAST MEDICAL HISTORY:  has a past medical history of Anxiety, Asthma, Breast cancer ([place]), Complication of anesthesia (2007), History of blood transfusion, History of mitral valve prolapse (1988), History of radiation therapy (04/02/16- 05/21/16), Hypertension, Neuropathy, Osteopenia determined by x-ray, Pneumonia, Raynaud's disease, Sigmoidovaginal fistula, and Skin cancer (03/12/2014).    PAST SURGICAL HISTORY: Past Surgical History:  Procedure Laterality Date  . ABDOMINAL HYSTERECTOMY  2007   with BSO  . APPENDECTOMY  2007  . AXILLARY LYMPH NODE DISSECTION Left 02/14/2016   Procedure: LEFT AXILLARY LYMPH NODE DISSECTION;  Surgeon: [name], MD;  Location: [facility];  Service: General;  Laterality: Left;  . BREAST RECONSTRUCTION WITH PLACEMENT OF TISSUE EXPANDER AND FLEX HD (ACELLULAR HYDRATED DERMIS) Left 02/02/2016   Procedure: IMMEDIATE LEFT BREAST RECONSTRUCTION WITH PLACEMENT OF TISSUE EXPANDER AND FLEX HD (ACELLULAR HYDRATED DERMIS);  Surgeon: [name], DO;  Location: [facility];  Service: Plastics;  Laterality: Left;  . BREAST REDUCTION SURGERY Right 03/28/2017   Procedure: RIGHT MAMMARY REDUCTION  (BREAST);  Surgeon: [name], DO;  Location: [facility];  Service: Plastics;  Laterality: Right;  . BREAST SURGERY Left 02/02/16   mastectomy with reconstruction  . c-section  1991  . COLON RESECTION SIGMOID  12/02/2012   [facility]   . HERNIA REPAIR  2007  . LAPAROSCOPIC ENDOMETRIOSIS FULGURATION  1984, 1989  . MASTOPEXY Right 03/28/2017   Procedure: MASTOPEXY;  Surgeon: [name], DO;  Location: [facility];  Service: Plastics;  Laterality: Right;  . Port- a- cath placement  07/2015  .  PORT-A-CATH REMOVAL Right 02/02/2016   Procedure: REMOVAL PORT-A-CATH;  Surgeon: [name], MD;  Location: [facility];   Service: General;  Laterality: Right;  . RADIOACTIVE SEED GUIDED AXILLARY SENTINEL LYMPH NODE Left 02/02/2016   Procedure: LEFT BREAST MASTECTOMY AND RADIOACTIVE SEED GUIDED LEFT SENTINEL LYMPH NODE BIOPSY ;  Surgeon: [name], MD;  Location: [facility];  Service: General;  Laterality: Left;  . REMOVAL OF TISSUE EXPANDER AND PLACEMENT OF IMPLANT Left 03/28/2017   Procedure: REMOVAL OF TISSUE EXPANDER AND PLACEMENT OF IMPLANT;  Surgeon: [name], DO;  Location: [facility];  Service: Plastics;  Laterality: Left;    FAMILY HISTORY: family history includes Lung cancer in her father.  SOCIAL HISTORY:  reports that she quit smoking about 9 years ago. Her smoking use included cigarettes. She has a 10.00 pack-year smoking history. She has never used smokeless tobacco. She reports that she drinks alcohol. She reports that she does not use drugs.  ALLERGIES: Sulfa antibiotics  MEDICATIONS:  Current Facility-Administered Medications  Medication Dose Route Frequency Provider Last Rate Last Dose  . amLODipine (NORVASC) tablet 5 mg  5 mg Oral Daily Rizwan, Saima, MD      . gabapentin (NEURONTIN) capsule 600 mg  600 mg Oral BID Rizwan, Saima, MD      . LORazepam (ATIVAN) tablet 0.5 mg  0.5 mg Oral QHS Rizwan, Saima, MD      . oxyCODONE (OXYCONTIN) 12 hr tablet 80 mg  80 mg Oral Q8H Rizwan, Saima, MD      . Oxycodone HCl TABS 20 mg  20 mg Oral Q4H PRN Rizwan, Eunice Blase, MD      . traZODone (DESYREL) tablet 150 mg  150 mg Oral QHS Debbe Odea, MD       Current Outpatient Medications  Medication Sig Dispense Refill  . amLODipine (NORVASC) 5 MG tablet Take 5 mg by mouth daily.    . Ascorbic Acid (VITAMIN C) 1000 MG tablet Take 1,000 mg by mouth 2 (two) times daily.    . carisoprodol (SOMA) 350 MG tablet Take 350 mg by mouth 3 (three) times daily.    . cetirizine (ZYRTEC) 10 MG tablet Take 10 mg by mouth daily.    . Cholecalciferol (VITAMIN D3) 10000 units capsule  Take 10,000 Units by mouth daily.     Marland Kitchen escitalopram (LEXAPRO) 20 MG tablet Take 1 tablet (20 mg total) by mouth daily. 90 tablet 4  . everolimus (AFINITOR) 10 MG tablet Take 1 tablet (10 mg total) by mouth daily. 30 tablet 6  . exemestane (AROMASIN) 25 MG tablet Take 1 tablet (25 mg total) by mouth daily after breakfast. 90 tablet 4  . fluticasone (FLONASE) 50 MCG/ACT nasal spray Place 2 sprays into both nostrils daily. Reported on 11/18/2015    . gabapentin (NEURONTIN) 300 MG capsule Take 4 capsules (1,200 mg total) by mouth at bedtime. TAKE 2 CAPSULES IN THE MORNING AND TAKE 2 CAPSULES MID-DAY AND TAKE 4CAPSULES AT BEDTIME 240 capsule 4  . LORazepam (ATIVAN) 0.5 MG tablet Take 1 tablet (0.5 mg total) by mouth at bedtime. 90 tablet 3  . Multiple Vitamin (MULTIVITAMIN) tablet Take 1 tablet by mouth daily.    . ondansetron (ZOFRAN) 8 MG tablet Take 1 tablet (8 mg total) by mouth every 8 (eight) hours as needed for nausea or vomiting. 20 tablet 5  . oxyCODONE (OXYCONTIN) 80 mg 12 hr tablet Take 2 tablets (160 mg total) by mouth every 8 (eight)  hours. 180 tablet 0  . Oxycodone HCl 20 MG TABS Take 1-2 tablets (20-40 mg total) by mouth every 4 (four) hours as needed. 180 tablet 0  . Probiotic Product (PROBIOTIC DAILY PO) Take 1 capsule by mouth daily. [place] prochlorperazine (COMPAZINE) 10 MG tablet Take 1 tablet (10 mg total) by mouth every 6 (six) hours as needed (Nausea or vomiting). 90 tablet 2  . traZODone (DESYREL) 50 MG tablet TAKE 3 TABLETS AT BEDTIME 90 tablet 2  . UNABLE TO FIND [place]    . UNABLE TO FIND [place] mushroom    . UNABLE TO FIND [place] Grifolafrodre    . zolpidem (AMBIEN) 5 MG tablet Take 1 tablet (5 mg total) by mouth at bedtime as needed for sleep. for sleep 30 tablet 2  . dexamethasone (DECADRON) 0.5 MG/5ML solution Swish 66mL in mouth for 31min and spit out. Use 4 times daily for 8 weeks, start with Afinitor. Avoid eating/drinking for  1hr after rinse. 500 mL 2    REVIEW OF SYSTEMS:  Notable for that above.   PHYSICAL EXAM:  height is 5\' 2"  (1.575 m) and weight is 151 lb (68.5 kg). Her oral temperature is 99.5 F (37.5 C). Her blood pressure is 171/99 (abnormal) and her pulse is 103 (abnormal). Her respiration is 17 and oxygen saturation is 93%.     Slightly lethargic.  Able to give a history. She recognizes me by name.  Finger-to-nose test a little wobbly. She is able to lift her legs against gravity. She reports mild numbness to touch on her lower extremities. Strength in upper extremities is grossly intact, reports no numbness at this time in arms. On visual exam, she reports blurred vision, but can report how many fingers I'm holding up. EOMI. Neck - chronic lymphedema.  KPS 30   LABORATORY DATA:  Lab Results  Component Value Date   WBC 4.4 03/19/2018   HGB 12.7 03/19/2018   HCT 36.8 03/19/2018   MCV 98.6 03/19/2018   PLT 216 03/19/2018   CMP     Component Value Date/Time   NA 139 03/19/2018 0754   NA 138 08/14/2017 1042   K 3.9 03/19/2018 0754   K 4.2 08/14/2017 1042   CL 103 03/19/2018 0754   CO2 26 03/19/2018 0754   CO2 26 08/14/2017 1042   GLUCOSE 115 (H) 03/19/2018 0754   GLUCOSE 129 08/14/2017 1042   BUN 9 03/19/2018 0754   BUN 8.3 08/14/2017 1042   CREATININE 0.69 03/19/2018 0754   CREATININE 0.74 01/15/2018 1409   CREATININE 0.8 08/14/2017 1042   CALCIUM 8.8 (L) 03/19/2018 0754   CALCIUM 9.6 08/14/2017 1042   PROT 6.9 03/19/2018 0754   PROT 6.5 08/14/2017 1042   ALBUMIN 3.7 03/19/2018 0754   ALBUMIN 3.4 (L) 08/14/2017 1042   AST 25 03/19/2018 0754   AST 35 (H) 01/15/2018 1409   AST 63 (H) 08/14/2017 1042   ALT 19 03/19/2018 0754   ALT 26 01/15/2018 1409   ALT 53 08/14/2017 1042   ALKPHOS 70 03/19/2018 0754   ALKPHOS 84 08/14/2017 1042   BILITOT 0.4 03/19/2018 0754   BILITOT 0.4 01/15/2018 1409   BILITOT 0.44 08/14/2017 1042   GFRNONAA >60 03/19/2018 0754   GFRNONAA >60  01/15/2018 1409   GFRAA >60 03/19/2018 0754   GFRAA >60 01/15/2018 1409         RADIOGRAPHY: Ct Cervical Spine Wo Contrast  Result Date: 03/19/2018  CLINICAL DATA:  Neck pain after a fall today. Near syncopal episode. Headache. Vertigo for 2 weeks. History of metastatic breast cancer. EXAM: CT CERVICAL SPINE WITHOUT CONTRAST TECHNIQUE: Multidetector CT imaging of the cervical spine was performed without intravenous contrast. Multiplanar CT image reconstructions were also generated. COMPARISON:  PET-CT 02/03/2018 and cervical spine CT 02/26/2017 FINDINGS: Alignment: Chronic mild reversal of the normal cervical lordosis. Unchanged slight anterolisthesis of C4 on C5 and C5 on C6. Skull base and vertebrae: Widespread, predominantly sclerotic osseous metastases have progressed from the 2018 cervical spine CT but are similar to the more recent PET-CT. No acute fracture is identified. Soft tissues and spinal canal: No gross canal hematoma or epidural tumor on this unenhanced study. No prevertebral fluid. Disc levels: Similar degenerative changes compared to the 2018 study. Mild disc space narrowing and spurring at C5-6 and C6-7. Multilevel facet arthrosis, most severe on the left at C4-5 resulting in mild left neural foraminal stenosis. No high-grade osseous neural foraminal or spinal canal stenosis identified. Upper chest: Clear lung apices. Other: Mild right and moderate left carotid bifurcation calcified atherosclerosis. IMPRESSION: 1. No acute osseous abnormality identified in the cervical spine. 2. Known widespread osseous metastatic disease. Electronically Signed   By: Logan Bores M.D.   On: 03/19/2018 11:17   Mr Jeri Cos And Wo Contrast  Result Date: 03/19/2018 CLINICAL DATA:  Recent near syncopal episode. Possible cerebellar lesion on outside head CT. Vertigo. Fall today. History of breast cancer with osseous metastases. EXAM: MRI HEAD WITHOUT AND WITH CONTRAST TECHNIQUE: Multiplanar, multiecho pulse  sequences of the brain and surrounding structures were obtained without and with intravenous contrast. CONTRAST:  65mL MULTIHANCE GADOBENATE DIMEGLUMINE 529 MG/ML IV SOLN COMPARISON:  Head CT report [facility] dated 03/10/2018 FINDINGS: Some sequences are mildly motion degraded. Brain: There is edema throughout the superior and medial cerebellar hemispheres bilaterally with involvement of the vermis, posterior pons, and tectum. 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 There is also leptomeningeal enhancement in the left occipital lobe and interpeduncular cistern. Masslike extra-axial enhancement in the pineal region measures 2.1 x 1.2 cm. There is likely a small amount of leptomeningeal enhancement along the ventral pons and medulla, and there is abnormal enhancement along the left seventh and eighth cranial nerve complex in the internal auditory canal. There is also abnormal enhancement involving Meckel's cave on the left and likely involving the cisternal segment of the left trigeminal. T2 shine through is noted in the cerebellum on diffusion imaging. No definite acute infarct is identified. There is no evidence of intracranial hemorrhage, midline shift, or extra-axial fluid collection. Posterior fossa edema partially effaces the fourth ventricle. The lateral and third ventricles are normal in size without evidence of obstructive hydrocephalus. A few small foci of cerebral white matter T2 hyperintensity are nonspecific. Vascular: Major intracranial vascular flow voids are preserved. Skull and upper cervical spine: Known widespread osseous metastatic disease involving the skull and included upper cervical spine. Extensive skull base involvement including of the clivus. Sinuses/Orbits: Unremarkable orbits. Clear paranasal sinuses. Trace left mastoid effusion. Other: None. IMPRESSION: 1. Extensive abnormal leptomeningeal  enhancement with greatest involvement of the posterior fossa most compatible with metastatic disease. Cerebellar edema with partial fourth ventricle effacement. No evidence of obstructive hydrocephalus at this time. 2. Extensive osseous metastases. Electronically Signed   By: Logan Bores M.D.   On: 03/19/2018 12:36      IMPRESSION/PLAN Today, I talked to the patient about  the findings and work-up thus far. We discussed the patient's diagnosis of leptomeningeal disease in the brain and general treatment for this, highlighting the role of radiotherapy in the management, vs IT chemotherapy. We discussed that steroids are the most rapid form of symptom relief, while radiotherapy would take several days to show symptom improvement, if any. She understands that steroids will not treat the cancer, and that RT or IT chemotherapy will be needed if she wants to be aggressive in managing this condition.  We also discussed the option of comfort care alone.  The patient was encouraged to ask questions that I answered to the best of my ability.   She understands this is a serious condition.  She was not accompanied by family today, and was quite tired, so she wished that we discuss things in more detail next time I see her.  In the meantime I recommend that she undergo completion MRI of the rest of the spine to determine the extent of leptomeningeal disease.  She will see Dr. Mickeal Skinner next week and be presented at tumor board next week.   I recommend a palliative care consult.  I will be out of office tomorrow, but please call Mont Dutton, CNS navigator, should you have immediate needs.  I will be back on Friday 7-26.   I spent 20 minutes of floor time, all of which was face to face with the patient. More than 50% of that time was spent in counseling and/or coordination of care.   __________________________________________   Eppie Gibson, MD

## 2018-03-19 NOTE — ED Notes (Signed)
ED TO INPATIENT HANDOFF REPORT  Name/Age/Gender Alison [name] 57 y.o. female  Code Status Code Status History    Date Active Date Inactive Code Status Order ID Comments User Context   02/14/2016 1302 02/15/2016 1714 Full Code [id number]  [name], MD Inpatient   02/02/2016 1511 02/03/2016 1321 Full Code [id number]  [name], DO Inpatient   08/15/2015 1023 08/16/2015 0323 Full Code [id number]  Arne Cleveland, MD HOV      Home/SNF/Other Home  Chief Complaint Near Syncope  Level of Care/Admitting Diagnosis ED Disposition    ED Disposition Condition [place]: [facility] [[id number]]  Level of Care: Med-Surg [16]  Diagnosis: Brain metastasis [facility]) [[id number]]  Admitting Physician: [place], [place]  Attending Physician: Debbe Odea [[id number]]  Estimated length of stay: past midnight tomorrow  Certification:: I certify this patient will need inpatient services for at least 2 midnights  PT Class (Do Not Modify): Inpatient [101]  PT Acc Code (Do Not Modify): Private [1]       Medical History Past Medical History:  Diagnosis Date  . Anxiety   . Asthma     seasonal asthma  . Breast cancer ([place])    metastatic breast cancer  . Complication of anesthesia 2007   aspiration pneumonia post hysterectomy.  Blood pressure would drop low- but no problems 01/2016- surgery  . History of blood transfusion   . History of mitral valve prolapse 1988   with palpitations  . History of radiation therapy 04/02/16- 05/21/16   Left Chest Wall, SCV, Axilla, IM nodes  . Hypertension    no meds  . Neuropathy    with chemo  . Osteopenia determined by x-ray   . Pneumonia    1980's and 1990's  . Raynaud's disease   . 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 Skin cancer 03/12/2014   basal- back    Allergies Allergies  Allergen Reactions  . Sulfa Antibiotics Other (See Comments)    Blisters in mouth    IV  Location/Drains/Wounds Patient Lines/Drains/Airways Status   Active Line/Drains/Airways    Name:   Placement date:   Placement time:   Site:   Days:   Peripheral IV 03/19/18 Right Wrist   03/19/18    0958    Wrist   less than 1          Labs/Imaging Results for orders placed or performed in visit on 03/19/18 (from the past 48 hour(s))  CBC with Differential     Status: Abnormal   Collection Time: 03/19/18  7:54 AM  Result Value Ref Range   WBC 4.4 3.9 - 10.3 K/uL   RBC 3.73 3.70 - 5.45 MIL/uL   Hemoglobin 12.7 11.6 - 15.9 g/dL   HCT 36.8 34.8 - 46.6 %   MCV 98.6 79.5 - 101.0 fL   MCH 34.0 25.1 - 34.0 pg   MCHC 34.5 31.5 - 36.0 g/dL   RDW 14.8 (H) 11.2 - 14.5 %   Platelets 216 145 - 400 K/uL   Neutrophils Relative % 70 %   Neutro Abs 3.0 1.5 - 6.5 K/uL   Lymphocytes Relative 15 %   Lymphs Abs 0.7 (L) 0.9 - 3.3 K/uL   Monocytes Relative 9 %   Monocytes Absolute 0.4 0.1 - 0.9 K/uL   Eosinophils Relative 5 %   Eosinophils Absolute 0.2 0.0 - 0.5 K/uL   Basophils Relative 1 %   Basophils Absolute  0.1 0.0 - 0.1 K/uL    Comment: Performed at [facility], [place] [address], [place], [place] [address]  Comprehensive metabolic panel     Status: Abnormal   Collection Time: 03/19/18  7:54 AM  Result Value Ref Range   Sodium 139 135 - 145 mmol/L   Potassium 3.9 3.5 - 5.1 mmol/L   Chloride 103 98 - 111 mmol/L   CO2 26 22 - 32 mmol/L   Glucose, Bld 115 (H) 70 - 99 mg/dL   BUN 9 6 - 20 mg/dL   Creatinine, Ser 0.69 0.44 - 1.00 mg/dL   Calcium 8.8 (L) 8.9 - 10.3 mg/dL   Total Protein 6.9 6.5 - 8.1 g/dL   Albumin 3.7 3.5 - 5.0 g/dL   AST 25 15 - 41 U/L   ALT 19 0 - 44 U/L   Alkaline Phosphatase 70 38 - 126 U/L   Total Bilirubin 0.4 0.3 - 1.2 mg/dL   GFR calc non Af Amer >60 >60 mL/min   GFR calc Af Amer >60 >60 mL/min    Comment: (NOTE) The eGFR has been calculated using the CKD EPI equation. This calculation has not been validated in all clinical  situations. eGFR's persistently <60 mL/min signify possible Chronic Kidney Disease.    Anion gap 10 5 - 15    Comment: Performed at [facility], [address] [place], [place] [address]   Ct Cervical Spine Wo Contrast  Result Date: 03/19/2018 CLINICAL DATA:  Neck pain after a fall today. Near syncopal episode. Headache. Vertigo for 2 weeks. History of metastatic breast cancer. EXAM: CT CERVICAL SPINE WITHOUT CONTRAST TECHNIQUE: Multidetector CT imaging of the cervical spine was performed without intravenous contrast. Multiplanar CT image reconstructions were also generated. COMPARISON:  PET-CT 02/03/2018 and cervical spine CT 02/26/2017 FINDINGS: Alignment: Chronic mild reversal of the normal cervical lordosis. Unchanged slight anterolisthesis of C4 on C5 and C5 on C6. Skull base and vertebrae: Widespread, predominantly sclerotic osseous metastases have progressed from the 2018 cervical spine CT but are similar to the more recent PET-CT. No acute fracture is identified. Soft tissues and spinal canal: No gross canal hematoma or epidural tumor on this unenhanced study. No prevertebral fluid. Disc levels: Similar degenerative changes compared to the 2018 study. Mild disc space narrowing and spurring at C5-6 and C6-7. Multilevel facet arthrosis, most severe on the left at C4-5 resulting in mild left neural foraminal stenosis. No high-grade osseous neural foraminal or spinal canal stenosis identified. Upper chest: Clear lung apices. Other: Mild right and moderate left carotid bifurcation calcified atherosclerosis. IMPRESSION: 1. No acute osseous abnormality identified in the cervical spine. 2. Known widespread osseous metastatic disease. Electronically Signed   By: Logan Bores M.D.   On: 03/19/2018 11:17   Mr Jeri Cos And Wo Contrast  Result Date: 03/19/2018 CLINICAL DATA:  Recent near syncopal episode. Possible cerebellar lesion on outside head CT. Vertigo. Fall today. History of  breast cancer with osseous metastases. EXAM: MRI HEAD WITHOUT AND WITH CONTRAST TECHNIQUE: Multiplanar, multiecho pulse sequences of the brain and surrounding structures were obtained without and with intravenous contrast. CONTRAST:  42m MULTIHANCE GADOBENATE DIMEGLUMINE 529 MG/ML IV SOLN [facility]  [facility] FINDINGS: Some sequences are mildly motion degraded. Brain: There is edema throughout the superior and medial cerebellar hemispheres bilaterally with involvement of the vermis, posterior pons, and tectum. 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 There is also leptomeningeal enhancement in the left occipital lobe and interpeduncular cistern. Masslike extra-axial enhancement in the pineal region measures 2.1 x 1.2 cm. There is likely a small amount of leptomeningeal enhancement along the ventral pons and medulla, and there is abnormal enhancement along the left seventh and eighth cranial nerve complex in the internal auditory canal. There is also abnormal enhancement involving Meckel's cave on the left and likely involving the cisternal segment of the left trigeminal. T2 shine through is noted in the cerebellum on diffusion imaging. No definite acute infarct is identified. There is no evidence of intracranial hemorrhage, midline shift, or extra-axial fluid collection. Posterior fossa edema partially effaces the fourth ventricle. The lateral and third ventricles are normal in size without evidence of obstructive hydrocephalus. A few small foci of cerebral white matter T2 hyperintensity are nonspecific. Vascular: Major intracranial vascular flow voids are preserved. Skull and upper cervical spine: Known widespread osseous metastatic disease involving the skull and included upper cervical spine. Extensive skull base involvement including of the clivus. Sinuses/Orbits: Unremarkable orbits.  Clear paranasal sinuses. Trace left mastoid effusion. Other: None. IMPRESSION: 1. 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 Cerebellar edema with partial fourth ventricle effacement. No evidence of obstructive hydrocephalus at this time. 2. Extensive osseous metastases. Electronically Signed   By: Logan Bores M.D.   On: 03/19/2018 12:36    Pending Labs Unresulted Labs (From admission, onward)   None      Vitals/Pain Today's Vitals   03/19/18 0900 03/19/18 0930 03/19/18 1001 03/19/18 1030  BP:  (!) 171/91 (!) 160/84 (!) 171/99  Pulse:  83 96 (!) 103  Resp:  19  17  Temp:      TempSrc:      SpO2:  90% 92% 93%  Weight: 151 lb (68.5 kg)     Height: 5' 2"  (1.575 m)     PainSc: 4        Isolation Precautions No active isolations  Medications Medications  gabapentin (NEURONTIN) capsule 600 mg (has no administration in time range)  Oxycodone HCl TABS 20 mg (has no administration in time range)  oxyCODONE (OXYCONTIN) 12 hr tablet 80 mg (has no administration in time range)  traZODone (DESYREL) tablet 150 mg (has no administration in time range)  amLODipine (NORVASC) tablet 5 mg (has no administration in time range)  LORazepam (ATIVAN) tablet 0.5 mg (has no administration in time range)  sodium chloride 0.9 % bolus 500 mL (0 mLs Intravenous Stopped 03/19/18 1122)  HYDROmorphone (DILAUDID) injection 0.5 mg (0.5 mg Intravenous Given 03/19/18 1003)  ondansetron (ZOFRAN) injection 4 mg (4 mg Intravenous Given 03/19/18 1003)  gadobenate dimeglumine (MULTIHANCE) injection 15 mL (14 mLs Intravenous Contrast Given 03/19/18 1159)  dexamethasone (DECADRON) tablet 4 mg (4 mg Oral Given 03/19/18 1316)    Mobility walks

## 2018-03-19 NOTE — ED Triage Notes (Signed)
Sent by [place] for reported near syncopal episode. Pt's only c/o is headache. Reports she gas been having vertigo for 2 weeks and had a CT done Monday.

## 2018-03-19 NOTE — ED Provider Notes (Signed)
[facility] DEPT Provider Note   CSN: [id number] Arrival date & time: 03/19/18  0846     History   Chief Complaint Chief Complaint  Patient presents with  . Near Syncope    HPI Alison [name] is a 57 y.o. female.  Patient fell over at Dr. Virgie Dad office.  She states that she had a fall last [place] and was seen in the emergency department.  She has CT scan at that time that did not see any bleed but recommended MRI for possible tumor.  The history is provided by the patient. No language interpreter was used.  Near Syncope  This is a new problem. The current episode started 1 to 2 hours ago. The problem occurs rarely. The problem has been resolved. Pertinent negatives include no chest pain, no abdominal pain and no headaches. Nothing aggravates the symptoms. Nothing relieves the symptoms. She has tried nothing for the symptoms. The treatment provided no relief.    Past Medical History:  Diagnosis Date  . Anxiety   . Asthma     seasonal asthma  . Breast cancer ([place])    metastatic breast cancer  . Complication of anesthesia 2007   aspiration pneumonia post hysterectomy.  Blood pressure would drop low- but no problems 01/2016- surgery  . History of blood transfusion   . History of mitral valve prolapse 1988   with palpitations  . History of radiation therapy 04/02/16- 05/21/16   Left Chest Wall, SCV, Axilla, IM nodes  . Hypertension    no meds  . Neuropathy    with chemo  . Osteopenia determined by x-ray   . Pneumonia    1980's and 1990's  . Raynaud's disease   . 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 Skin cancer 03/12/2014   basal- back    Patient Active Problem List   Diagnosis Date Noted  . Brain metastasis ([place]) 03/19/2018  . Pain from bone metastases ([place]) 09/11/2017  . Counseling regarding goals of care 04/16/2017  . Bone metastases ([place]) 02/28/2017  . Uncontrolled pain  02/28/2017  . Osteopenia determined by x-ray 09/20/2016  . Primary malignant neoplasm of left breast with stage 2 nodal metastasis per American Joint Committee on Cancer 7th edition (N2) ([place]) 02/14/2016  . 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 Insomnia 09/01/2015  . Malignant neoplasm of upper-outer quadrant of left breast in female, estrogen receptor positive ([place]) 08/08/2015    Past Surgical History:  Procedure Laterality Date  . ABDOMINAL HYSTERECTOMY  2007   with BSO  . APPENDECTOMY  2007  . AXILLARY LYMPH NODE DISSECTION Left 02/14/2016   Procedure: LEFT AXILLARY LYMPH NODE DISSECTION;  Surgeon: [name], MD;  Location: [place];  Service: General;  Laterality: Left;  . BREAST RECONSTRUCTION WITH PLACEMENT OF TISSUE EXPANDER AND FLEX HD (ACELLULAR HYDRATED DERMIS) Left 02/02/2016   Procedure: IMMEDIATE LEFT BREAST RECONSTRUCTION WITH PLACEMENT OF TISSUE EXPANDER AND FLEX HD (ACELLULAR HYDRATED DERMIS);  Surgeon: [name], DO;  Location: [facility];  Service: Plastics;  Laterality: Left;  . BREAST REDUCTION SURGERY Right 03/28/2017   Procedure: RIGHT MAMMARY REDUCTION  (BREAST);  Surgeon: [name], DO;  Location: [facility];  Service: Plastics;  Laterality: Right;  . BREAST SURGERY Left 02/02/16   mastectomy with reconstruction  . c-section  1991  . COLON RESECTION SIGMOID  12/02/2012   [facility]   . HERNIA REPAIR  2007  . LAPAROSCOPIC ENDOMETRIOSIS FULGURATION  1984, 1989  . MASTOPEXY Right 03/28/2017   Procedure: MASTOPEXY;  Surgeon: [name], DO;  Location: [place];  Service: Plastics;  Laterality: Right;  . Port- a- cath placement  07/2015  . PORT-A-CATH REMOVAL Right 02/02/2016   Procedure: REMOVAL PORT-A-CATH;  Surgeon: [name], MD;  Location: [place] [facility];  Service: General;  Laterality: Right;  . RADIOACTIVE SEED GUIDED AXILLARY SENTINEL LYMPH NODE Left 02/02/2016    Procedure: LEFT BREAST MASTECTOMY AND RADIOACTIVE SEED GUIDED LEFT SENTINEL LYMPH NODE BIOPSY ;  Surgeon: [name], MD;  Location: [place];  Service: General;  Laterality: Left;  . REMOVAL OF TISSUE EXPANDER AND PLACEMENT OF IMPLANT Left 03/28/2017   Procedure: REMOVAL OF TISSUE EXPANDER AND PLACEMENT OF IMPLANT;  Surgeon: [name], DO;  Location: [facility];  Service: Plastics;  Laterality: Left;     OB History   None      Home Medications    Prior to Admission medications   Medication Sig Start Date End Date Taking? Authorizing Provider  amLODipine (NORVASC) 5 MG tablet Take 5 mg by mouth daily.   Yes [provider]  Ascorbic Acid (VITAMIN C) 1000 MG tablet Take 1,000 mg by mouth 2 (two) times daily.   Yes [provider]  carisoprodol (SOMA) 350 MG tablet Take 350 mg by mouth 3 (three) times daily.   Yes [provider]  cetirizine (ZYRTEC) 10 MG tablet Take 10 mg by mouth daily.   Yes [provider]  Cholecalciferol (VITAMIN D3) 10000 units capsule Take 10,000 Units by mouth daily.    Yes [provider]  escitalopram (LEXAPRO) 20 MG tablet Take 1 tablet (20 mg total) by mouth daily. 08/14/17  Yes Causey, Charlestine Massed, NP  everolimus (AFINITOR) 10 MG tablet Take 1 tablet (10 mg total) by mouth daily. 02/12/18  Yes Magrinat, Virgie Dad, MD  exemestane (AROMASIN) 25 MG tablet Take 1 tablet (25 mg total) by mouth daily after breakfast. 02/12/18  Yes Magrinat, Virgie Dad, MD  fluticasone (FLONASE) 50 MCG/ACT nasal spray Place 2 sprays into both nostrils daily. Reported on 11/18/2015   Yes [provider]  gabapentin (NEURONTIN) 300 MG capsule Take 4 capsules (1,200 mg total) by mouth at bedtime. TAKE 2 CAPSULES IN THE MORNING AND TAKE 2 CAPSULES MID-DAY AND TAKE 4CAPSULES AT BEDTIME 02/12/18  Yes Magrinat, Virgie Dad, MD  LORazepam (ATIVAN) 0.5 MG tablet Take 1 tablet (0.5 mg total) by mouth at  bedtime. 10/03/17  Yes Magrinat, Virgie Dad, MD  Multiple Vitamin (MULTIVITAMIN) tablet Take 1 tablet by mouth daily.   Yes [provider]  ondansetron (ZOFRAN) 8 MG tablet Take 1 tablet (8 mg total) by mouth every 8 (eight) hours as needed for nausea or vomiting. 07/16/17  Yes Causey, Charlestine Massed, NP  oxyCODONE (OXYCONTIN) 80 mg 12 hr tablet Take 2 tablets (160 mg total) by mouth every 8 (eight) hours. 02/26/18  Yes Magrinat, Virgie Dad, MD  Oxycodone HCl 20 MG TABS Take 1-2 tablets (20-40 mg total) by mouth every 4 (four) hours as needed. 03/19/18  Yes Magrinat, Virgie Dad, MD  Probiotic Product (PROBIOTIC DAILY PO) Take 1 capsule by mouth daily. [place]   Yes [provider]  prochlorperazine (COMPAZINE) 10 MG tablet Take 1 tablet (10 mg total) by mouth every 6 (six) hours as needed (Nausea or vomiting). 08/14/17  Yes  Gardenia Phlegm, NP  traZODone (DESYREL) 50 MG tablet TAKE 3 TABLETS AT BEDTIME 01/22/18  Yes Magrinat, Virgie Dad, MD  UNABLE TO FIND [facility]   Yes [provider]  UNABLE TO FIND [place] [facility]   Yes [provider]  UNABLE TO [place]   Yes [provider]  zolpidem (AMBIEN) 5 MG tablet Take 1 tablet (5 mg total) by mouth at bedtime as needed for sleep. for sleep 10/18/17  Yes Magrinat, Virgie Dad, MD  dexamethasone (DECADRON) 0.5 MG/5ML solution Swish 67mL in mouth for 84min and spit out. Use 4 times daily for 8 weeks, start with Afinitor. Avoid eating/drinking for 1hr after rinse. 02/21/18   Magrinat, Virgie Dad, MD    Family History Family History  Problem Relation Age of Onset  . Lung cancer Father     Social History Social History   Tobacco Use  . Smoking status: Former Smoker    Packs/day: 1.00    Years: 10.00    Pack years: 10.00    Types: Cigarettes    Last attempt to quit: 11/25/2008    Years since quitting: 9.3  . Smokeless tobacco: Never Used  . Tobacco comment:  on and off smoker never consistent  Substance Use Topics  . Alcohol use: Yes    Comment: social  . Drug use: No     Allergies   Sulfa antibiotics   Review of Systems Review of Systems  Constitutional: Negative for appetite change and fatigue.  HENT: Negative for congestion, ear discharge and sinus pressure.   Eyes: Negative for discharge.  Respiratory: Negative for cough.   Cardiovascular: Positive for near-syncope. Negative for chest pain.  Gastrointestinal: Negative for abdominal pain and diarrhea.  Genitourinary: Negative for frequency and hematuria.  Musculoskeletal: Negative for back pain.  Skin: Negative for rash.  Neurological: Positive for dizziness. Negative for seizures and headaches.  Psychiatric/Behavioral: Negative for hallucinations.     Physical Exam Updated Vital Signs BP (!) 171/99   Pulse (!) 103   Temp 99.5 F (37.5 C) (Oral)   Resp 17   Ht 5\' 2"  (1.575 m)   Wt 68.5 kg (151 lb)   SpO2 93%   BMI 27.62 kg/m   Physical Exam  Constitutional: She is oriented to person, place, and time. She appears well-developed.  HENT:  Head: Normocephalic.  Eyes: Conjunctivae and EOM are normal. No scleral icterus.  Neck: Neck supple. No thyromegaly present.  Cardiovascular: Normal rate and regular rhythm. Exam reveals no gallop and no friction rub.  No murmur heard. Pulmonary/Chest: No stridor. She has no wheezes. She has no rales. She exhibits no tenderness.  Abdominal: She exhibits no distension. There is no tenderness. There is no rebound.  Musculoskeletal: Normal range of motion. She exhibits no edema.  Lymphadenopathy:    She has no cervical adenopathy.  Neurological: She is oriented to person, place, and time. She exhibits normal muscle tone.  Patient very unsteady ambulating.  And she walks very slow.  Both legs are making her feel unsteady  Skin: No rash noted. No erythema.  Psychiatric: She has a normal mood and affect. Her behavior is normal.      ED Treatments / Results  Labs (all labs ordered are listed, but only abnormal results are displayed) Labs Reviewed - No data to display  EKG EKG Interpretation  Date/Time:  Wednesday March 19 2018 10:25:31 EDT Ventricular Rate:  100 PR Interval:    QRS Duration: 85 QT Interval:  381  QTC Calculation: 492 R Axis:   -37 Text Interpretation:  Sinus tachycardia Left axis deviation Anteroseptal infarct, age indeterminate Baseline wander in lead(s) I II III aVL aVF V2 V3 Confirmed by Milton Ferguson [phone]) on 03/19/2018 12:56:27 PM   Radiology Ct Cervical Spine Wo Contrast  Result Date: 03/19/2018 CLINICAL DATA:  Neck pain after a fall today. Near syncopal episode. Headache. Vertigo for 2 weeks. History of metastatic breast cancer. EXAM: CT CERVICAL SPINE WITHOUT CONTRAST TECHNIQUE: Multidetector CT imaging of the cervical spine was performed without intravenous contrast. Multiplanar CT image reconstructions were also generated. COMPARISON:  PET-CT 02/03/2018 and cervical spine CT 02/26/2017 FINDINGS: Alignment: Chronic mild reversal of the normal cervical lordosis. Unchanged slight anterolisthesis of C4 on C5 and C5 on C6. Skull base and vertebrae: Widespread, predominantly sclerotic osseous metastases have progressed from the 2018 cervical spine CT but are similar to the more recent PET-CT. No acute fracture is identified. Soft tissues and spinal canal: No gross canal hematoma or epidural tumor on this unenhanced study. No prevertebral fluid. Disc levels: Similar degenerative changes compared to the 2018 study. Mild disc space narrowing and spurring at C5-6 and C6-7. Multilevel facet arthrosis, most severe on the left at C4-5 resulting in mild left neural foraminal stenosis. No high-grade osseous neural foraminal or spinal canal stenosis identified. Upper chest: Clear lung apices. Other: Mild right and moderate left carotid bifurcation calcified atherosclerosis. IMPRESSION: 1. No acute osseous  abnormality identified in the cervical spine. 2. Known widespread osseous metastatic disease. Electronically Signed   By: Logan Bores M.D.   On: 03/19/2018 11:17   Mr Jeri [facility] And Wo Contrast  Result Date: 03/19/2018 CLINICAL DATA:  Recent near syncopal episode. Possible cerebellar lesion on outside head CT. Vertigo. Fall today. History of breast cancer with osseous metastases. EXAM: MRI HEAD WITHOUT AND WITH CONTRAST TECHNIQUE: Multiplanar, multiecho pulse sequences of the brain and surrounding structures were obtained without and with intravenous contrast. CONTRAST:  37mL MULTIHANCE GADOBENATE DIMEGLUMINE 529 MG/ML IV SOLN COMPARISON:  Head CT report [facility] dated 03/10/2018 FINDINGS: Some sequences are mildly motion degraded. Brain: There is edema throughout the superior and medial cerebellar hemispheres bilaterally with involvement of the vermis, posterior pons, and tectum. 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 There is also leptomeningeal enhancement in the left occipital lobe and interpeduncular cistern. Masslike extra-axial enhancement in the pineal region measures 2.1 x 1.2 cm. There is likely a small amount of leptomeningeal enhancement along the ventral pons and medulla, and there is abnormal enhancement along the left seventh and eighth cranial nerve complex in the internal auditory canal. There is also abnormal enhancement involving Meckel's cave on the left and likely involving the cisternal segment of the left trigeminal. T2 shine through is noted in the cerebellum on diffusion imaging. No definite acute infarct is identified. There is no evidence of intracranial hemorrhage, midline shift, or extra-axial fluid collection. Posterior fossa edema partially effaces the fourth ventricle. The lateral and third ventricles are normal in size without evidence of obstructive hydrocephalus. A few small foci of  cerebral white matter T2 hyperintensity are nonspecific. Vascular: Major intracranial vascular flow voids are preserved. Skull and upper cervical spine: Known widespread osseous metastatic disease involving the skull and included upper cervical spine. Extensive skull base involvement including of the clivus. Sinuses/Orbits: Unremarkable orbits. Clear paranasal sinuses. Trace left mastoid effusion. Other: None. IMPRESSION: 1. 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 Cerebellar edema with partial fourth ventricle effacement. No evidence of obstructive hydrocephalus at this time. 2. Extensive osseous metastases. Electronically Signed   By: Logan Bores M.D.   On: 03/19/2018 12:36    Procedures Procedures (including critical care time)  Medications Ordered in ED Medications  sodium chloride 0.9 % bolus 500 mL (0 mLs Intravenous Stopped 03/19/18 1122)  HYDROmorphone (DILAUDID) injection 0.5 mg (0.5 mg Intravenous Given 03/19/18 1003)  ondansetron (ZOFRAN) injection 4 mg (4 mg Intravenous Given 03/19/18 1003)  gadobenate dimeglumine (MULTIHANCE) injection 15 mL (14 mLs Intravenous Contrast Given 03/19/18 1159)  dexamethasone (DECADRON) tablet 4 mg (4 mg Oral Given 03/19/18 1316)     Initial Impression / Assessment and Plan / ED Course  I have reviewed the triage vital signs and the nursing notes.  Pertinent labs & imaging results that were available during my care of the patient were reviewed by me and considered in my medical decision making (see chart for details).     Patient has metastatic disease seen in the MRI of her brain.  I spoke with her oncologist and he recommends Decadron 4 mg twice a day along with admission to the hospital and consulting the neuro oncologist.  Dr. [name] will also see the patient in the hospital  Final Clinical Impressions(s) / ED Diagnoses   Final diagnoses:  Brain  metastases [facility])    ED Discharge Orders    None       Milton Ferguson, MD 03/19/18 1330

## 2018-03-19 NOTE — ED Notes (Signed)
Dr. [name] phones before her arrival in E.D. To tell us she had a near-syncopal event in their parking lot. She has breast cancer; and has been seen and underwent non-contrast C.T. At [facility] recently, which showed "a possible lesion in her cerebellum". Dr. [name] states that C.T. Is available in Chart Everywhere.

## 2018-03-19 NOTE — Progress Notes (Signed)
COURTESY NOTE: Met with the patient; she is a little less dizzy and more relaxed, slightly lethargic. I gave her a copy of her brain MRI so she could share it with her family when they return later today and I wrote out an explanation of what leptomeningeal spread of cancer is so she can understand her situation to some extent. We discussed possible radiation and intrathecal methotrexate treatments. She is on an appropriate dose of steroids.  I alerted Dr Isidore Moos to this admission and have made an appt for her to see Dr Mickeal Skinner next Tuesday-- he will be out of town until then.  I will come by again early tomorrow (around 8 AM) if family has further questions  Thank you for your help to this patient!

## 2018-03-19 NOTE — ED Notes (Signed)
Bed: WT88 Expected date:  Expected time:  Means of arrival:  Comments: Cancer Center/near syncope

## 2018-03-19 NOTE — Progress Notes (Signed)
Patient was brought to the ER via wheelchair with Alesa, NT.  Report given to Shirlean Mylar, RN in the ER.

## 2018-03-19 NOTE — Progress Notes (Incomplete)
Radiation Oncology         [phone] ________________________________  Inpatient Re-Consultation  Name: Alison [name] MRN: [id number]  Date: 03/19/2018  DOB: 1961/03/29  [id number], Alison Him, MD  No ref. provider found   REFERRING PHYSICIAN: No ref. provider found  DIAGNOSIS: No diagnosis found.  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 female who has been admitted to the hospital following her appointment with Dr. [name] this morning. She had been admitted to the hospital in [place] during vacation on 03/10/18. She reports experiencing vertigo symptoms-- light-headedness, nausea, double vision.  On 02/03/18, a PET scan revealed: largely stable hypermetabolic bone metastases although some new areas of bony hypermetabolism are evident on today's study; no evidence for soft tissue or visceral hypermetabolic metastatic involvement on today's exam; Aortic Atherosclerois (ICD10-170.0) A CT cervical spine without contrast was performed today, 03/19/18, revealing: no acute osseous abnormality identified in the cervical spine; known widespread osseous metastatic disease. A MRI brain with and without contrast also performed today, 03/19/18, showed: extensive abnormal leptomeningeal enhancement with greatest involvement of the posterior fossa most compatible with metastatic disease, cerebellar edema with partial fourth ventricle effacement, no evidence of obstructive hydrocephalus at this time; extensive osseous metastases.  She reports headaches, nausea, blurring and double vision, ringing (and "whooshing," static-like sounds) in her ears. She also reports weakness and trouble walking, feeling like her legs are "un-attached" for the last 3 weeks.  PREVIOUS RADIATION THERAPY: Yes,  04/02/16 - 05/21/16; Left Chest Wall/ 60.4 Gy *** 04/01/17 - 04/23/17; L-S Spine/ 35 Gy in 14 fractions of 2.5 Gy; 3D/ 10X,, 15X  Photon  PAST MEDICAL HISTORY:  has a past medical history of Anxiety, Asthma, Breast cancer ([place]), Complication of anesthesia (2007), History of blood transfusion, History of mitral valve prolapse (1988), History of radiation therapy (04/02/16- 05/21/16), Hypertension, Neuropathy, Osteopenia determined by x-ray, Pneumonia, Raynaud's disease, Sigmoidovaginal fistula, and Skin cancer (03/12/2014).    PAST SURGICAL HISTORY: Past Surgical History:  Procedure Laterality Date   ABDOMINAL HYSTERECTOMY  2007   with BSO   APPENDECTOMY  2007   AXILLARY LYMPH NODE DISSECTION Left 02/14/2016   Procedure: LEFT AXILLARY LYMPH NODE DISSECTION;  Surgeon: [name], MD;  Location: [facility];  Service: General;  Laterality: Left;   BREAST RECONSTRUCTION WITH PLACEMENT OF TISSUE EXPANDER AND FLEX HD (ACELLULAR HYDRATED DERMIS) Left 02/02/2016   Procedure: IMMEDIATE LEFT BREAST RECONSTRUCTION WITH PLACEMENT OF TISSUE EXPANDER AND FLEX HD (ACELLULAR HYDRATED DERMIS);  Surgeon: [name], DO;  Location: [facility];  Service: Plastics;  Laterality: Left;   BREAST REDUCTION SURGERY Right 03/28/2017   Procedure: RIGHT MAMMARY REDUCTION  (BREAST);  Surgeon: [name], DO;  Location: [facility];  Service: Plastics;  Laterality: Right;   BREAST SURGERY Left 02/02/16   mastectomy with reconstruction   c-section  [place]  12/02/2012   [facility]    HERNIA REPAIR  2007   LAPAROSCOPIC ENDOMETRIOSIS FULGURATION  1984, 1989   MASTOPEXY Right 03/28/2017   Procedure: MASTOPEXY;  Surgeon: [name], DO;  Location: [facility];  Service: Plastics;  Laterality: Right;   Port- a- cath placement  07/2015   PORT-A-CATH REMOVAL Right 02/02/2016   Procedure: REMOVAL PORT-A-CATH;  Surgeon: [name], MD;  Location: [facility];  Service: General;  Laterality: Right;   RADIOACTIVE SEED GUIDED AXILLARY SENTINEL LYMPH NODE  Left  02/02/2016   Procedure: LEFT BREAST MASTECTOMY AND RADIOACTIVE SEED GUIDED LEFT SENTINEL LYMPH NODE BIOPSY ;  Surgeon: [name], MD;  Location: [facility];  Service: General;  Laterality: Left;   REMOVAL OF TISSUE EXPANDER AND PLACEMENT OF IMPLANT Left 03/28/2017   Procedure: REMOVAL OF TISSUE EXPANDER AND PLACEMENT OF IMPLANT;  Surgeon: [name], DO;  Location: [facility];  Service: Plastics;  Laterality: Left;    FAMILY HISTORY: family history includes Lung cancer in her father.  SOCIAL HISTORY:  reports that she quit smoking about 9 years ago. Her smoking use included cigarettes. She has a 10.00 pack-year smoking history. She has never used smokeless tobacco. She reports that she drinks alcohol. She reports that she does not use drugs.  ALLERGIES: Sulfa antibiotics  MEDICATIONS:  No current facility-administered medications for this visit.    Current Outpatient Medications  Medication Sig Dispense Refill   amLODipine (NORVASC) 5 MG tablet Take 5 mg by mouth daily.     Ascorbic Acid (VITAMIN C) 1000 MG tablet Take 1,000 mg by mouth 2 (two) times daily.     carisoprodol (SOMA) 350 MG tablet Take 350 mg by mouth 3 (three) times daily.     cetirizine (ZYRTEC) 10 MG tablet Take 10 mg by mouth daily.     Cholecalciferol (VITAMIN D3) 10000 units capsule Take 10,000 Units by mouth daily.      dexamethasone (DECADRON) 0.5 MG/5ML solution Swish 64mL in mouth for 58min and spit out. Use 4 times daily for 8 weeks, start with Afinitor. Avoid eating/drinking for 1hr after rinse. 500 mL 2   escitalopram (LEXAPRO) 20 MG tablet Take 1 tablet (20 mg total) by mouth daily. 90 tablet 4   everolimus (AFINITOR) 10 MG tablet Take 1 tablet (10 mg total) by mouth daily. 30 tablet 6   exemestane (AROMASIN) 25 MG tablet Take 1 tablet (25 mg total) by mouth daily after breakfast. 90 tablet 4   fluticasone (FLONASE) 50 MCG/ACT nasal spray Place 2 sprays into both  nostrils daily. Reported on 11/18/2015     gabapentin (NEURONTIN) 300 MG capsule Take 4 capsules (1,200 mg total) by mouth at bedtime. TAKE 2 CAPSULES IN THE MORNING AND TAKE 2 CAPSULES MID-DAY AND TAKE 4CAPSULES AT BEDTIME 240 capsule 4   LORazepam (ATIVAN) 0.5 MG tablet Take 1 tablet (0.5 mg total) by mouth at bedtime. 90 tablet 3   Multiple Vitamin (MULTIVITAMIN) tablet Take 1 tablet by mouth daily.     ondansetron (ZOFRAN) 8 MG tablet Take 1 tablet (8 mg total) by mouth every 8 (eight) hours as needed for nausea or vomiting. 20 tablet 5   oxyCODONE (OXYCONTIN) 80 mg 12 hr tablet Take 2 tablets (160 mg total) by mouth every 8 (eight) hours. 180 tablet 0   Oxycodone HCl 20 MG TABS Take 1-2 tablets (20-40 mg total) by mouth every 4 (four) hours as needed. 180 tablet 0   Probiotic Product (PROBIOTIC DAILY PO) Take 1 capsule by mouth daily. Renew Life Extra Care Ultimate Flora 50 Billion     prochlorperazine (COMPAZINE) 10 MG tablet Take 1 tablet (10 mg total) by mouth every 6 (six) hours as needed (Nausea or vomiting). 90 tablet 2   traZODone (DESYREL) 50 MG tablet TAKE 3 TABLETS AT BEDTIME 90 tablet 2   UNABLE TO FIND [facility]     UNABLE TO FIND [place] mushroom     UNABLE TO FIND [place] Grifolafrodre     zolpidem (  AMBIEN) 5 MG tablet Take 1 tablet (5 mg total) by mouth at bedtime as needed for sleep. for sleep 30 tablet 2   Facility-Administered Medications Ordered in Other Visits  Medication Dose Route Frequency Provider Last Rate Last Dose   amLODipine (NORVASC) tablet 5 mg  5 mg Oral Daily Rizwan, Saima, MD       gabapentin (NEURONTIN) capsule 600 mg  600 mg Oral BID Rizwan, Saima, MD       LORazepam (ATIVAN) tablet 0.5 mg  0.5 mg Oral QHS Rizwan, Saima, MD       oxyCODONE (OXYCONTIN) 12 hr tablet 80 mg  80 mg Oral Q8H Rizwan, Saima, MD       Oxycodone HCl TABS 20 mg  20 mg Oral Q4H PRN Debbe Odea, MD       traZODone (DESYREL) tablet 150 mg  150 mg Oral QHS Debbe Odea,  MD        REVIEW OF SYSTEMS:  Notable for that above.   PHYSICAL EXAM: height is 5\' 2"  (1.575 m) and weight is 151 lb (68.5 kg). Her oral temperature is 99.5 F (37.5 C). Her blood pressure is 171/99 (abnormal) and her pulse is 103 (abnormal). Her respiration is 17 and oxygen saturation is 93%.  Finger-to-nose test a little wobbly. She is able to lift her legs against gravity. She reports mild numbness to touch on her lower extremities. Strength in upper extremities is grossly intact, reports no numbness at this time. On visual exam, she reports blurred vision, but can report how many fingers I'm holding up.  KPS = 30  100 - Normal; no complaints; no evidence of disease. 90   - Able to carry on normal activity; minor signs or symptoms of disease. 80   - Normal activity with effort; some signs or symptoms of disease. 72   - Cares for self; unable to carry on normal activity or to do active work. 60   - Requires occasional assistance, but is able to care for most of his personal needs. 50   - Requires considerable assistance and frequent medical care. 61   - Disabled; requires special care and assistance. 35   - Severely disabled; hospital admission is indicated although death not imminent. 73   - Very sick; hospital admission necessary; active supportive treatment necessary. 10   - Moribund; fatal processes progressing rapidly. 0     - Dead  Karnofsky DA, Abelmann WH, Craver LS and [place] [facility] [phone]) The use of the nitrogen mustards in the palliative treatment of carcinoma: with particular reference to bronchogenic carcinoma Cancer 1 634-56   LABORATORY DATA:  Lab Results  Component Value Date   WBC 4.4 03/19/2018   HGB 12.7 03/19/2018   HCT 36.8 03/19/2018   MCV 98.6 03/19/2018   PLT 216 03/19/2018   CMP     Component Value Date/Time   NA 139 03/19/2018 0754   NA 138 08/14/2017 1042   K 3.9 03/19/2018 0754   K 4.2 08/14/2017 1042   CL 103 03/19/2018 0754   CO2 26  03/19/2018 0754   CO2 26 08/14/2017 1042   GLUCOSE 115 (H) 03/19/2018 0754   GLUCOSE 129 08/14/2017 1042   BUN 9 03/19/2018 0754   BUN 8.3 08/14/2017 1042   CREATININE 0.69 03/19/2018 0754   CREATININE 0.74 01/15/2018 1409   CREATININE 0.8 08/14/2017 1042   CALCIUM 8.8 (L) 03/19/2018 0754   CALCIUM 9.6 08/14/2017 1042   PROT 6.9 03/19/2018 0754   PROT  6.5 08/14/2017 1042   ALBUMIN 3.7 03/19/2018 0754   ALBUMIN 3.4 (L) 08/14/2017 1042   AST 25 03/19/2018 0754   AST 35 (H) 01/15/2018 1409   AST 63 (H) 08/14/2017 1042   ALT 19 03/19/2018 0754   ALT 26 01/15/2018 1409   ALT 53 08/14/2017 1042   ALKPHOS 70 03/19/2018 0754   ALKPHOS 84 08/14/2017 1042   BILITOT 0.4 03/19/2018 0754   BILITOT 0.4 01/15/2018 1409   BILITOT 0.44 08/14/2017 1042   GFRNONAA >60 03/19/2018 0754   GFRNONAA >60 01/15/2018 1409   GFRAA >60 03/19/2018 0754   GFRAA >60 01/15/2018 1409         RADIOGRAPHY: Ct Cervical Spine Wo Contrast  Result Date: 03/19/2018 CLINICAL DATA:  Neck pain after a fall today. Near syncopal episode. Headache. Vertigo for 2 weeks. History of metastatic breast cancer. EXAM: CT CERVICAL SPINE WITHOUT CONTRAST TECHNIQUE: Multidetector CT imaging of the cervical spine was performed without intravenous contrast. Multiplanar CT image reconstructions were also generated. COMPARISON:  PET-CT 02/03/2018 and cervical spine CT 02/26/2017 FINDINGS: Alignment: Chronic mild reversal of the normal cervical lordosis. Unchanged slight anterolisthesis of C4 on C5 and C5 on C6. Skull base and vertebrae: Widespread, predominantly sclerotic osseous metastases have progressed from the 2018 cervical spine CT but are similar to the more recent PET-CT. No acute fracture is identified. Soft tissues and spinal canal: No gross canal hematoma or epidural tumor on this unenhanced study. No prevertebral fluid. Disc levels: Similar degenerative changes compared to the 2018 study. Mild disc space narrowing and spurring  at C5-6 and C6-7. Multilevel facet arthrosis, most severe on the left at C4-5 resulting in mild left neural foraminal stenosis. No high-grade osseous neural foraminal or spinal canal stenosis identified. Upper chest: Clear lung apices. Other: Mild right and moderate left carotid bifurcation calcified atherosclerosis. IMPRESSION: 1. No acute osseous abnormality identified in the cervical spine. 2. Known widespread osseous metastatic disease. Electronically Signed   By: Logan Bores M.D.   On: 03/19/2018 11:17   Mr Jeri Cos And Wo Contrast  Result Date: 03/19/2018 CLINICAL DATA:  Recent near syncopal episode. Possible cerebellar lesion on outside head CT. Vertigo. Fall today. History of breast cancer with osseous metastases. EXAM: MRI HEAD WITHOUT AND WITH CONTRAST TECHNIQUE: Multiplanar, multiecho pulse sequences of the brain and surrounding structures were obtained without and with intravenous contrast. CONTRAST:  12mL MULTIHANCE GADOBENATE DIMEGLUMINE 529 MG/ML IV SOLN COMPARISON:  Head CT report from [facility] dated 03/10/2018 FINDINGS: Some sequences are mildly motion degraded. Brain: There is edema throughout the superior and medial cerebellar hemispheres bilaterally with involvement of the vermis, posterior pons, and tectum. 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 There is also leptomeningeal enhancement in the left occipital lobe and interpeduncular cistern. Masslike extra-axial enhancement in the pineal region measures 2.1 x 1.2 cm. There is likely a small amount of leptomeningeal enhancement along the ventral pons and medulla, and there is abnormal enhancement along the left seventh and eighth cranial nerve complex in the internal auditory canal. There is also abnormal enhancement involving Meckel's cave on the left and likely involving the cisternal segment of the left trigeminal. T2 shine through is noted in the  cerebellum on diffusion imaging. No definite acute infarct is identified. There is no evidence of intracranial hemorrhage, midline shift, or extra-axial fluid collection. Posterior fossa edema partially effaces the fourth ventricle. The lateral and third ventricles are normal in size without  evidence of obstructive hydrocephalus. A few small foci of cerebral white matter T2 hyperintensity are nonspecific. Vascular: Major intracranial vascular flow voids are preserved. Skull and upper cervical spine: Known widespread osseous metastatic disease involving the skull and included upper cervical spine. Extensive skull base involvement including of the clivus. Sinuses/Orbits: Unremarkable orbits. Clear paranasal sinuses. Trace left mastoid effusion. Other: None. IMPRESSION: 1. 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 Cerebellar edema with partial fourth ventricle effacement. No evidence of obstructive hydrocephalus at this time. 2. Extensive osseous metastases. Electronically Signed   By: Logan Bores M.D.   On: 03/19/2018 12:36      IMPRESSION/PLAN: Metastasis to the brain  ***  I spent 30 minutes  face to face with the patient and more than 50% of that time was spent in counseling and/or coordination of care.    __________________________________________   Eppie Gibson, MD  This document serves as a record of services personally performed by Eppie Gibson, MD. It was created on his behalf by Wilburn Mylar, a trained medical scribe. The creation of this record is based on the scribe's personal observations and the provider's statements to them. This document has been checked and approved by the attending provider.

## 2018-03-20 ENCOUNTER — Other Ambulatory Visit: Payer: Self-pay | Admitting: *Deleted

## 2018-03-20 ENCOUNTER — Telehealth: Payer: Self-pay | Admitting: Radiation Therapy

## 2018-03-20 DIAGNOSIS — Z9012 Acquired absence of left breast and nipple: Secondary | ICD-10-CM

## 2018-03-20 DIAGNOSIS — Z923 Personal history of irradiation: Secondary | ICD-10-CM

## 2018-03-20 DIAGNOSIS — C7931 Secondary malignant neoplasm of brain: Principal | ICD-10-CM

## 2018-03-20 DIAGNOSIS — Z515 Encounter for palliative care: Secondary | ICD-10-CM

## 2018-03-20 DIAGNOSIS — Z7189 Other specified counseling: Secondary | ICD-10-CM

## 2018-03-20 DIAGNOSIS — G8929 Other chronic pain: Secondary | ICD-10-CM

## 2018-03-20 LAB — HIV ANTIBODY (ROUTINE TESTING W REFLEX): HIV Screen 4th Generation wRfx: NONREACTIVE

## 2018-03-20 MED ORDER — ZOLPIDEM TARTRATE 5 MG PO TABS
5.0000 mg | ORAL_TABLET | Freq: Every evening | ORAL | Status: DC | PRN
  Administered 2018-03-20 – 2018-03-21 (×2): 5 mg via ORAL
  Filled 2018-03-20 (×2): qty 1

## 2018-03-20 MED ORDER — HYDROMORPHONE HCL 1 MG/ML IJ SOLN
1.0000 mg | INTRAMUSCULAR | Status: DC | PRN
  Administered 2018-03-20 – 2018-03-21 (×8): 1 mg via INTRAVENOUS
  Filled 2018-03-20 (×8): qty 1

## 2018-03-20 MED ORDER — LORAZEPAM 0.5 MG PO TABS
0.5000 mg | ORAL_TABLET | Freq: Once | ORAL | Status: AC | PRN
  Administered 2018-03-21: 0.5 mg via ORAL
  Filled 2018-03-20: qty 1

## 2018-03-20 NOTE — Progress Notes (Signed)
Alison [name]   DOB:03-Aug-1961   AT#:[id number]   KYH#:[id number]  Subjective:  Alison [name] is a little calmer today.  There seems to be less vertigo.  She is still in bed and not moving or saying much however.  She still has a headache.  There is been no nausea or vomiting.  She denies visual changes.  Her husband Alison [name] is in the room   Objective: Middle-aged white woman examined in bed Vitals:   03/19/18 2132 03/20/18 0557  BP: 134/66 116/76  Pulse: (!) 104 95  Resp: 14   Temp: 98.7 F (37.1 C) 98.1 F (36.7 C)  SpO2: 93% 92%    Body mass index is 27.8 kg/m.  Intake/Output Summary (Last 24 hours) at 03/20/2018 1325 Last data filed at 03/19/2018 2138 Gross per 24 hour  Intake 480 ml  Output -  Net 480 ml     Sclerae unicteric  Lungs no rales or wheezes--auscultated anterolaterally  Heart regular rate and rhythm  Abdomen soft, +BS  Neuro nonfocal; main symptoms have been cerebellar  Breast exam: Deferred  CBG (last 3)  No results for input(s): GLUCAP in the last 72 hours.   Labs:  Lab Results  Component Value Date   WBC 4.4 03/19/2018   HGB 12.7 03/19/2018   HCT 36.8 03/19/2018   MCV 98.6 03/19/2018   PLT 216 03/19/2018   NEUTROABS 3.0 03/19/2018    _0 @  Urine Studies No results for input(s): UHGB, CRYS in the last 72 hours.  Invalid input(s): UACOL, UAPR, USPG, UPH, UTP, UGL, UKET, UBIL, UNIT, UROB, ULEU, UEPI, UWBC, URBC, UBAC, CAST, UCOM, BILUA  Basic Metabolic Panel: Recent Labs  Lab 03/19/18 0754  NA 139  K 3.9  CL 103  CO2 26  GLUCOSE 115*  BUN 9  CREATININE 0.69  CALCIUM 8.8*   GFR Estimated Creatinine Clearance: 69.7 mL/min (by C-G formula based on SCr of 0.69 mg/dL). Liver Function Tests: Recent Labs  Lab 03/19/18 0754  AST 25  ALT 19  ALKPHOS 70  BILITOT 0.4  PROT 6.9  ALBUMIN 3.7   No results for input(s): LIPASE, AMYLASE in the last 168 hours. No results for input(s): AMMONIA in the last 168 hours. Coagulation  profile No results for input(s): INR, PROTIME in the last 168 hours.  CBC: Recent Labs  Lab 03/19/18 0754  WBC 4.4  NEUTROABS 3.0  HGB 12.7  HCT 36.8  MCV 98.6  PLT 216   Cardiac Enzymes: No results for input(s): CKTOTAL, CKMB, CKMBINDEX, TROPONINI in the last 168 hours. BNP: Invalid input(s): POCBNP CBG: No results for input(s): GLUCAP in the last 168 hours. D-Dimer No results for input(s): DDIMER in the last 72 hours. Hgb A1c No results for input(s): HGBA1C in the last 72 hours. Lipid Profile No results for input(s): CHOL, HDL, LDLCALC, TRIG, CHOLHDL, LDLDIRECT in the last 72 hours. Thyroid function studies No results for input(s): TSH, T4TOTAL, T3FREE, THYROIDAB in the last 72 hours.  Invalid input(s): FREET3 Anemia work up No results for input(s): VITAMINB12, FOLATE, FERRITIN, TIBC, IRON, RETICCTPCT in the last 72 hours. Microbiology No results found for this or any previous visit (from the past 240 hour(s)).    Studies:  Ct Cervical Spine Wo Contrast  Result Date: 03/19/2018 CLINICAL DATA:  Neck pain after a fall today. Near syncopal episode. Headache. Vertigo for 2 weeks. History of metastatic breast cancer. EXAM: CT CERVICAL SPINE WITHOUT CONTRAST TECHNIQUE: Multidetector CT imaging of the cervical spine was performed without intravenous contrast.  Multiplanar CT image reconstructions were also generated. COMPARISON:  PET-CT 02/03/2018 and cervical spine CT 02/26/2017 FINDINGS: Alignment: Chronic mild reversal of the normal cervical lordosis. Unchanged slight anterolisthesis of C4 on C5 and C5 on C6. Skull base and vertebrae: Widespread, predominantly sclerotic osseous metastases have progressed from the 2018 cervical spine CT but are similar to the more recent PET-CT. No acute fracture is identified. Soft tissues and spinal canal: No gross canal hematoma or epidural tumor on this unenhanced study. No prevertebral fluid. Disc levels: Similar degenerative changes compared  to the 2018 study. Mild disc space narrowing and spurring at C5-6 and C6-7. Multilevel facet arthrosis, most severe on the left at C4-5 resulting in mild left neural foraminal stenosis. No high-grade osseous neural foraminal or spinal canal stenosis identified. Upper chest: Clear lung apices. Other: Mild right and moderate left carotid bifurcation calcified atherosclerosis. IMPRESSION: 1. No acute osseous abnormality identified in the cervical spine. 2. Known widespread osseous metastatic disease. Electronically Signed   By: Logan Bores M.D.   On: 03/19/2018 11:17   Mr Jeri Cos And Wo Contrast  Result Date: 03/19/2018 CLINICAL DATA:  Recent near syncopal episode. Possible cerebellar lesion on outside head CT. Vertigo. Fall today. History of breast cancer with osseous metastases. EXAM: MRI HEAD WITHOUT AND WITH CONTRAST TECHNIQUE: Multiplanar, multiecho pulse sequences of the brain and surrounding structures were obtained without and with intravenous contrast. CONTRAST:  43m MULTIHANCE GADOBENATE DIMEGLUMINE 529 MG/ML IV SOLN COMPAR[facility]  [facility] FINDINGS: Some sequences are mildly motion degraded. Brain: There is edema throughout the superior and medial cerebellar hemispheres bilaterally with involvement of the vermis, posterior pons, and tectum. 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 There is also leptomeningeal enhancement in the left occipital lobe and interpeduncular cistern. Masslike extra-axial enhancement in the pineal region measures 2.1 x 1.2 cm. There is likely a small amount of leptomeningeal enhancement along the ventral pons and medulla, and there is abnormal enhancement along the left seventh and eighth cranial nerve complex in the internal auditory canal. There is also abnormal enhancement involving Meckel's cave on the left and likely involving the cisternal segment of  the left trigeminal. T2 shine through is noted in the cerebellum on diffusion imaging. No definite acute infarct is identified. There is no evidence of intracranial hemorrhage, midline shift, or extra-axial fluid collection. Posterior fossa edema partially effaces the fourth ventricle. The lateral and third ventricles are normal in size without evidence of obstructive hydrocephalus. A few small foci of cerebral white matter T2 hyperintensity are nonspecific. Vascular: Major intracranial vascular flow voids are preserved. Skull and upper cervical spine: Known widespread osseous metastatic disease involving the skull and included upper cervical spine. Extensive skull base involvement including of the clivus. Sinuses/Orbits: Unremarkable orbits. Clear paranasal sinuses. Trace left mastoid effusion. Other: None. IMPRESSION: 1. 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 Cerebellar edema with partial fourth ventricle effacement. No evidence of obstructive hydrocephalus at this time. 2. Extensive osseous metastases. Electronically Signed   By: ALogan BoresM.D.   On: 03/19/2018 12:36   Assessment: 57y.o. [place] woman status post biopsy of 2 separate masses in the left breast upper outer quadrant 08/04/2015, clinically mT2 N1, stage IIb/IIIa invasive ductal carcinoma, grade 3, the larger mass being estrogen and progesterone receptor positive, HER-2 negative, with an MIB-1 of 12%             (  a) biopsy of a left axillary lymph node 08/09/2015 was positive, with extracapsular extension  (1) neoadjuvant chemotherapy started 08/25/2015 consisting of doxorubicin and cyclophosphamide in dose dense fashion 4, completed 10/06/2015,  followed by weekly paclitaxel 10 completed 12/23/2015             (a) final 2 planned cycles of weekly paclitaxel omitted because of neuropathy concerns  (2) status post left mastectomy with  targeted axillary dissection 02/02/2016 for a ypT2 ypN2a, stage IIIa invasive ductal carcinoma, grade 2, with negative margins, repeat prognostic panel showing estrogen receptor positive, but progesterone receptor and HER-2 negative             (a) completion axillary lymph node dissection 02/14/2016 showed an additional 2 out of 8 lymph nodes to be involved (final count 6 out of 10 lymph nodes positive             (b) expander placement at the time of left mastectomy  (3) adjuvant capecitabine starting concurrently with radiation--discontinued 05/21/2016  (4) adjuvant  radiation started 04/02/2016, completed 05/21/2016  (5) anastrozole started October 2017, discontinued January 2019             (a) bone density August 2016 showed osteopenia             (b) repeat bone density 09/07/2016 shows a T score of -1.7.             (c) the patient is status post total abdominal hysterectomy with bilateral salpingo-oophorectomy.  (6) chronic pain: Starting OxyContin 20 mg twice a day with oxycodone as needed for breakthrough pain 03/02/2016             (a) failed transition to tramadol, gabapentin, naproxen              (b) started methadone 5 mg TID as of 12/20/2016-- not tolerated             (c) on OxyContin and Oxycodone as discussed below  (7) Research:             (a) B-WEL study ([facility] [id number])--enrolled in [employer] 1 (received education)             (b) P[facility]AS AFT-05 study: randomized to treatment, started palbociclib 07/26/2016                         (i) dose reduced to 100 mg, then to 75 mg because of cytopenias                         (ii) went off study study May 2018 because of persistent cytopenias             (c) taxane study 12/20/2016             (d) aspirin study             (e) went off studies as metastatic disease developed in June 2018  METASTATIC DISEASE 02/15/2017: CT scan of the lumbar spine and 02/27/2017 CT of the thoracic and cervical spine shows multiple bone  lesions but no evidence of cord compromise; bone lesions are confirmed on PET scan 02/22/2017 which however shows no evidence of visceral disease             (a) CA-27-29 is noninformative             (b) left upper lobe changes noted on PET scan 02/22/2017 felt to be  secondary to radiation             (c)repeat PET scan 09/05/2017 shows again no visceral disease, stable bony metastases             (d) PET scan 02/03/2018 shows no visceral disease, new bone lesions  (8) continued anastrozole, abemaciclib added 02/22/2017, taken 2 weeks on, 2 weeks off              (a) fulvestrant started 02/22/2017 ,              (b) anastrozole discontinued January 2019             (c) fulvestrant and abemaciclib discontinued June 2019 with progression  (9) yearly zolendronate given 09/26/2016, changed to monthly denosumab/Xgeva starting 03/19/2017  (10) radiation 04/01/2017 - 04/23/2017 Site/dose:The lumbar spine was treated to 35 Gy in 14 fractions of 2.5 Gy.   (11) chronic pain: Currently on OxyContin 160 grams p.o. TID/ oxycodone 20-40 mg Q 4h PRN             (i) bowel prophylaxis in place             (ii) also followed by Dr. 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  Up to this point she has had bone only metastatic disease.  That has been variously treated as listed above.  We had recently changed her to exemestane and everolimus although she never did start the everolimus because she developed CNS symptoms which she thought might be related to the medication but of course they turn out to be related to leptomeningeal disease.  We discussed the fact that the cerebrospinal fluid Veith's not only the brain but the entire spine and that therefore the cells floating in the  fluid can be found at every level in the neuraxis.  They understand this is not curable and that it is a dire development.  As always in stage IV disease when there is progression the first decision to be made is whether there will be more treatment or whether we will move to best supportive care.  The latter means that we focus on symptoms, not on life prolongation.  The goals there are to make the patient comfortable and as fit as possible as long as possible.  When we make that choice we usually also place a hospice referral  If she wants treatment there are 2 ways to go about it.  One is radiation.  The patient met with Dr. Isidore Moos yesterday and Dr. Isidore Moos is requesting a total spinal MRI which we were glad to facilitate for her.  The patient's case will be presented Monday morning at conference and I have also scheduled her to meet with our neuro oncologist Dr. Mickeal Skinner on 03/26/2018.  Radiation of course could be simply palliative to the brain or could be craniospinal radiation the latter in my experience leading to significant complications including loss of most of the functional bone marrow leading to severe permanent cytopenias.  Another way of treating this is to place an Ommaya reservoir and proceed to methotrexate twice a week for several weeks until there is evidence of response, then weekly.  It is very appropriate for the patient  to meet with our palliative care team and hopefully over the next few days the patient and her husband will be able to make a definitive decision which way they want to go.  I will continue to follow with you.   Chauncey Cruel, MD 03/20/2018  1:25 PM Medical Oncology and Hematology [facility] [address] [place],  [address] Tel. [phone]    Fax. [phone]

## 2018-03-20 NOTE — Progress Notes (Signed)
PROGRESS NOTE    Alison [name]  TOI:[id number] DOB: [birth year] DOA: 03/19/2018 PCP: [name], MD    Brief Narrative:  57 year old with past medical history relevant for widely metastatic breast cancer status post mastectomy on left, chemotherapy, radiation to spine, chronic pain, hypertension who presented with approximately 1 to 2 weeks of Vertigo, difficulty ambulatingAnd found to have metastatic disease to the brain.   Assessment & Plan:   Principal Problem:   Brain metastasis ([place]) Active Problems:   Malignant neoplasm of upper-outer quadrant of left breast in female, estrogen receptor positive ([place])   Bone metastases ([facility])   HTN (hypertension)   Chronic pain   #) Widely metastatic breast cancer with metastases to bone and brain: Per oncology patient does not have much in the way of treatment options.  Currently she is limited to either palliative chemotherapy, palliative radiation oncology or discussion with palliative care in general.  The family is currently deciding this.  Regardless her primary symptoms are vertigo and difficulty ambulating. -IV pain control  -physical therapy  -oncology consultation, appreciate recommendations - Patient will be followed up as an outpatient for radiation oncology if they choose to do this -Palliative care consultation, appreciate recommendations -Continue IV dexamethasone 4 mg every 6 hours  #) Pain/psych: -Continue lorazepam 0.5 mg nightly -Continue PRN oxycodone -Continue gabapentin 1200 mg nightly  #) Hypertension: -Continue amlodipine 5 mg  Fluids: Tolerating p.o. Elect lites: Monitor and supplement Nutrition: Regular diet  Prophylaxis: SCDs  Disposition: Pending PT evaluation and discussion of goals of care  Full code  Consultants:   Oncology  Palliative care  Procedures:   None  Antimicrobials:   None   Subjective: Patient reports she is doing somewhat better.  She has not tried to get up due  to her vertigo and unsteadiness.  She reports that her primary complaint is severe headache.  She denies any nausea, vomiting, diarrhea.  Objective: Vitals:   03/19/18 1030 03/19/18 1454 03/19/18 2132 03/20/18 0557  BP: (!) 171/99 (!) 160/91 134/66 116/76  Pulse: (!) 103 (!) 111 (!) 104 95  Resp: 17 16 14    Temp:  97.8 F (36.6 C) 98.7 F (37.1 C) 98.1 F (36.7 C)  TempSrc:  Oral Oral Oral  SpO2: 93% 94% 93% 92%  Weight:  68.9 kg (152 lb)    Height:  5\' 2"  (1.575 m)      Intake/Output Summary (Last 24 hours) at 03/20/2018 1329 Last data filed at 03/19/2018 2138 Gross per 24 hour  Intake 480 ml  Output -  Net 480 ml   Filed Weights   03/19/18 0900 03/19/18 1454  Weight: 68.5 kg (151 lb) 68.9 kg (152 lb)    Examination:  General exam: Appears calm and comfortable  Respiratory system: Clear to auscultation. Respiratory effort normal. Cardiovascular system: Regular rate and rhythm, no murmurs Gastrointestinal system: Abdomen is nondistended, soft and nontender. No organomegaly or masses felt. Normal bowel sounds heard. Central nervous system: Alert and oriented.  Dysdiadochokinesia, dysmetria, Bilateral lower extremity strength is 5 out of 5. Extremities: No lower extremity edema Skin: No rashes Psychiatry: Judgement and insight appear normal. Mood & affect appropriate.     Data Reviewed: I have personally reviewed following labs and imaging studies  CBC: Recent Labs  Lab 03/19/18 0754  WBC 4.4  NEUTROABS 3.0  HGB 12.7  HCT 36.8  MCV 98.6  PLT 833   Basic Metabolic Panel: Recent Labs  Lab 03/19/18 0754  NA 139  K  3.9  CL 103  CO2 26  GLUCOSE 115*  BUN 9  CREATININE 0.69  CALCIUM 8.8*   GFR: Estimated Creatinine Clearance: 69.7 mL/min (by C-G formula based on SCr of 0.69 mg/dL). Liver Function Tests: Recent Labs  Lab 03/19/18 0754  AST 25  ALT 19  ALKPHOS 70  BILITOT 0.4  PROT 6.9  ALBUMIN 3.7   No results for input(s): LIPASE, AMYLASE in the  last 168 hours. No results for input(s): AMMONIA in the last 168 hours. Coagulation Profile: No results for input(s): INR, PROTIME in the last 168 hours. Cardiac Enzymes: No results for input(s): CKTOTAL, CKMB, CKMBINDEX, TROPONINI in the last 168 hours. BNP (last 3 results) No results for input(s): PROBNP in the last 8760 hours. HbA1C: No results for input(s): HGBA1C in the last 72 hours. CBG: No results for input(s): GLUCAP in the last 168 hours. Lipid Profile: No results for input(s): CHOL, HDL, LDLCALC, TRIG, CHOLHDL, LDLDIRECT in the last 72 hours. Thyroid Function Tests: No results for input(s): TSH, T4TOTAL, FREET4, T3FREE, THYROIDAB in the last 72 hours. Anemia Panel: No results for input(s): VITAMINB12, FOLATE, FERRITIN, TIBC, IRON, RETICCTPCT in the last 72 hours. Sepsis Labs: No results for input(s): PROCALCITON, LATICACIDVEN in the last 168 hours.  No results found for this or any previous visit (from the past 240 hour(s)).       Radiology Studies: Ct Cervical Spine Wo Contrast  Result Date: 03/19/2018 CLINICAL DATA:  Neck pain after a fall today. Near syncopal episode. Headache. Vertigo for 2 weeks. History of metastatic breast cancer. EXAM: CT CERVICAL SPINE WITHOUT CONTRAST TECHNIQUE: Multidetector CT imaging of the cervical spine was performed without intravenous contrast. Multiplanar CT image reconstructions were also generated. COMPARISON:  PET-CT 02/03/2018 and cervical spine CT 02/26/2017 FINDINGS: Alignment: Chronic mild reversal of the normal cervical lordosis. Unchanged slight anterolisthesis of C4 on C5 and C5 on C6. Skull base and vertebrae: Widespread, predominantly sclerotic osseous metastases have progressed from the 2018 cervical spine CT but are similar to the more recent PET-CT. No acute fracture is identified. Soft tissues and spinal canal: No gross canal hematoma or epidural tumor on this unenhanced study. No prevertebral fluid. Disc levels: Similar  degenerative changes compared to the 2018 study. Mild disc space narrowing and spurring at C5-6 and C6-7. Multilevel facet arthrosis, most severe on the left at C4-5 resulting in mild left neural foraminal stenosis. No high-grade osseous neural foraminal or spinal canal stenosis identified. Upper chest: Clear lung apices. Other: Mild right and moderate left carotid bifurcation calcified atherosclerosis. IMPRESSION: 1. No acute osseous abnormality identified in the cervical spine. 2. Known widespread osseous metastatic disease. Electronically Signed   By: Logan Bores M.D.   On: 03/19/2018 11:17   Mr Jeri Cos And Wo Contrast  Result Date: 03/19/2018 CLINICAL DATA:  Recent near syncopal episode. Possible cerebellar lesion on outside head CT. Vertigo. Fall today. History of breast cancer with osseous metastases. EXAM: MRI HEAD WITHOUT AND WITH CONTRAST TECHNIQUE: Multiplanar, multiecho pulse sequences of the brain and surrounding structures were obtained without and with intravenous contrast. CONTRAST:  35mL MULTIHANCE GADOBENATE DIMEGLUMINE 529 MG/ML IV SOLN COMPARISON:  Head CT report from [facility] dated 03/10/2018 FINDINGS: Some sequences are mildly motion degraded. Brain: There is edema throughout the superior and medial cerebellar hemispheres bilaterally with involvement of the vermis, posterior pons, and tectum. 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 There is also leptomeningeal enhancement in the left  occipital lobe and interpeduncular cistern. Masslike extra-axial enhancement in the pineal region measures 2.1 x 1.2 cm. There is likely a small amount of leptomeningeal enhancement along the ventral pons and medulla, and there is abnormal enhancement along the left seventh and eighth cranial nerve complex in the internal auditory canal. There is also abnormal enhancement involving Meckel's cave on the left and likely  involving the cisternal segment of the left trigeminal. T2 shine through is noted in the cerebellum on diffusion imaging. No definite acute infarct is identified. There is no evidence of intracranial hemorrhage, midline shift, or extra-axial fluid collection. Posterior fossa edema partially effaces the fourth ventricle. The lateral and third ventricles are normal in size without evidence of obstructive hydrocephalus. A few small foci of cerebral white matter T2 hyperintensity are nonspecific. Vascular: Major intracranial vascular flow voids are preserved. Skull and upper cervical spine: Known widespread osseous metastatic disease involving the skull and included upper cervical spine. Extensive skull base involvement including of the clivus. Sinuses/Orbits: Unremarkable orbits. Clear paranasal sinuses. Trace left mastoid effusion. Other: None. IMPRESSION: 1. 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 Cerebellar edema with partial fourth ventricle effacement. No evidence of obstructive hydrocephalus at this time. 2. Extensive osseous metastases. Electronically Signed   By: Logan Bores M.D.   On: 03/19/2018 12:36        Scheduled Meds: . amLODipine  5 mg Oral Daily  . dexamethasone  4 mg Intravenous Q6H  . gabapentin  1,200 mg Oral QHS  . gabapentin  600 mg Oral BID  . LORazepam  0.5 mg Oral QHS  . oxyCODONE  80 mg Oral Q8H  . traZODone  150 mg Oral QHS   Continuous Infusions:   LOS: 1 day    Time spent: [place], MD [facility]  If 7PM-7AM, please contact night-coverage [email or web address] Password [facility] 03/20/2018, 1:29 PM

## 2018-03-20 NOTE — Consult Note (Signed)
Consultation Note Date: 03/20/2018   Patient Name: Alison [name]  DOB: March 08, 1961  MRN: [id number]  Age / Sex: 57 y.o., female  PCP: Tisovec, Fransico Him, MD Referring Physician: Cristy Folks, MD  Reason for Consultation: Establishing goals of care  HPI/Patient Profile: 57 y.o. female admitted on 03/19/2018 from home with dizziness and falls.  He has a past medical history significant for invasive ductal breast cancer with metastasis status post left mastectomy, reconstruction chemo, radiation, chronic pain, hypertension, mitral valve prolapse, neuropathy related to chemotherapy, osteopenia, Raynaud's disease, and skin cancer.  Patient presented to the ED after seeing her oncologist Dr. [name] at the cancer center.  Patient reported she was walking down the hall from her appointment and began to feel dizzy and as she reached out to hold onto something she fell.  She denies loss of consciousness.  Patient reported some posterior neck pain.  Patient reports also recently returning from a beach vacation and experiencing dizziness with a fall as well.  At that time she states she was seen at [facility] and discharged after no acute findings.  Patient reports she continues to feel like things are spinning when she is walking.  She denies any visual changes.  Endorses nausea without vomiting as well as headaches.  During her ED course MRI revealed extensive abnormal leptomeningeal enhancement with greatest involvement of the posterior fossa most compatible with metastatic disease.  Cerebellar edema with partial fourth ventricle effacement.  Extensive osseous metastasis.  CT C-spine showed osseous metastasis with no significant stenosis.  Since admission patient has been started on Decadron.  She has been followed by the oncology team.  Palliative medicine team consulted for goals of care discussion.  Clinical  Assessment and Goals of Care: I have reviewed medical records including lab results, imaging, Epic notes, and MAR, received report from the bedside RN, and assessed the patient. I then met at the bedside with patient and her husband [name] to discuss diagnosis prognosis, [place], EOL wishes, disposition and options.  Patient is lying in bed.  She denies any pain or discomfort at this time.  She is alert and oriented x3.  [name] is appropriate to engage in goals of care discussion with her husband and I.  I introduced Palliative Medicine as specialized medical care for people living with serious illness. It focuses on providing relief from the symptoms and stress of a serious illness. The goal is to improve quality of life for both the patient and the family.  We discussed a brief life review of the patient.  Patient reports she is a woman of [place].  She is a retired Electrical engineer.  She has been married to her husband for more than 60 years and they have a 29 year old son who lives in the home.  Patient states she loves spending time with her family, taking their annual beach trips and shopping.  As far as functional and nutritional status both patient and husband reports 3 weeks prior to admission patient was  ambulatory without assistance and able to perform all ADLs independently.  She reports she was not experiencing any dizziness or headaches at that time.  She was shopping, having dinner, and driving as usual.  Patient states approximately 3 weeks ago she began having severe headaches which she associated to the new medication.  She states she stopped the medication however the headaches did not subside.  She reports she then began to have a whooshing sound in her ear and also began to notice some dizziness/vertigo.  Patient was on family beach trip when she became extremely dizzy while walking on the beach and her husband and bystander had to assist because she fell to her knees.  She  reports been seen with no acute findings however the headache and dizziness seem to have gotten worse.  She continues to struggle with the headaches and dizziness which now requires her to hold on or have assistance when ambulating due to her fear of falling and because of the dizziness.  She states her appetite has been good other than some associated nausea with the dizziness.  We discussed her current illness and what it means in the larger context of her on-going co-morbidities.  Natural disease trajectory and expectations at EOL were discussed.  Both patient and husband verbalized understanding of her new brain metastasis diagnosis.  They verbalized their awareness that this is not curable however there are palliative approaches that can be done per their oncologist Dr. [name].  Patient and husband verbalized they are aware that they have several options in regards to approaching her condition.  At this time after further discussion they are leaning more towards having an Ommaya reservoir placed and receiving methotrexate based on recommendations.  Their goal is to continue to meet with oncology team as well as Dr. Mickeal Skinner and receive ingestions or recommendations and hopefully be able to proceed with this route.  They state they are fearful to only focus on radiation due to the potential side effects and knowing that radiation may not target all necessary areas.  Again they verbalized their understanding that this is a palliative approach and is hopeful for the best but also prepare for the worse.  Both patient and husband are very tearful and states they have so much more like to live and are hopeful to spend more time with her son and do more things that they have planned for the near future.  Support was given.  I attempted to elicit values and goals of care important to the patient.    We did also talk about more of a supportive approach involving hospice as patient and husband both verbalized  quality of life is very important to them.  However at this time they would like to continue to proceed with the above plan if able.  They state they cannot just do nothing and live with wondering if they would have least engaged in some form of intervention if she would have had more time.  Advanced directives, concepts specific to code status, artifical feeding and hydration, and rehospitalization were considered and discussed.  We discussed advanced directives with a focus on patient's wishes in regards to her code status.  Patient husband became tearful again and both stated they have discussed this and they would like everything done that is possible including intubation if necessary.  They both stated again that she has so much more life to live and there is so much that they have plan to do, and that they  want her to have every fighting chance possible.  We discussed what it would look like in an emergent situation and patient will require to be coded.  They verbalized understanding and appreciation and stated they will continue to have conversations together.  They both verbalized if she was brain dead that would be the only objection to continuing aggressive measures, however they still want her to have a fighting chance with awareness that family could potentially be faced to make the decision to discontinue life support if she required intubation.  Patient and husband again verbalized understanding.  Husband states he will be prepared to make that decision when the time comes knowing that he again has allowed her a fighting chance.  Hospice and Palliative Care services outpatient were explained and offered.  Patient and husband is not prepared to accept hospice services at this time given their desire to continue with aggressive treatments.  They did verbalize their wishes to have outpatient palliative services.  We discussed being followed by palliative at the cancer center with my partner Wadie Lessen, NP.  Both patient and has been was very appreciative for this type of service and state they will be very interested in having her follow up with them if possible for support.   Questions and concerns were addressed. The family was encouraged to call with questions or concerns.  PMT will continue to support holistically.  Primary Decision Maker; patient is alert and oriented x3.  She is capable of making medical decisions. NEXT OF Theodis Sato (husband)     SUMMARY OF RECOMMENDATIONS    Full code-patient husband continues to want aggressive measures despite awareness of incurable cancer.  Both patient and husband verbalizes their awareness of her condition and their wishes are for her to have everything done in an emergent situation including CPR, defibrillation, and intubation.  They did agree to continue to discuss their wishes amongst themselves.  Patient and husband verbalized their wishes for to continue with aggressive measures and plan to discuss with her oncologist Dr. [name] their wishes to proceed with follow-up with Dr. Mickeal Skinner with possible Ommaya reservoir placement and methotrexate treatment.  He continues stating they want to give her every fighting chance that she has.  Patient is appropriate for palliative follow-up with my colleague Wadie Lessen NP once discharged and receiving treatment at the cancer center.  Stanton Kidney is aware of patient's condition and request to be followed.  Case management also requested for outpatient palliative services.  Daughter will continue to support patient, patient's family, and medical team during hospitalization.  Code Status/Advance Care Planning:  Full code  Palliative Prophylaxis:   Bowel Regimen, Delirium Protocol, Frequent Pain Assessment and Turn Reposition  Additional Recommendations (Limitations, Scope, Preferences):  Full Scope Treatment continue to treat the treatable while hospitalized including aggressive  measures.  Psycho-social/Spiritual:   Desire for further Chaplaincy support:No  Additional Recommendations: Caregiving  Support/Resources  Prognosis:   Unable to determine-guarded to poor in the setting of metastatic breast cancer with mets to spine and newly diagnosed brain mets, deconditioning, dizziness, neuropathy, pretension, status post chemotherapy and radiation.  Discharge Planning: To Be Determined with outpatient palliative follow-up.      Primary Diagnoses: Present on Admission: . Brain metastasis ([place]) . Bone metastases ([place])   I have reviewed the medical record, interviewed the patient and family, and examined the patient. The following aspects are pertinent.  Past Medical History:  Diagnosis Date  . Anxiety   . Asthma  seasonal asthma  . Breast cancer ([place])    metastatic breast cancer  . Complication of anesthesia 2007   aspiration pneumonia post hysterectomy.  Blood pressure would drop low- but no problems 01/2016- surgery  . History of blood transfusion   . History of mitral valve prolapse 1988   with palpitations  . History of radiation therapy 04/02/16- 05/21/16   Left Chest Wall, SCV, Axilla, IM nodes  . Hypertension    no meds  . Neuropathy    with chemo  . Osteopenia determined by x-ray   . Pneumonia    1980's and 1990's  . Raynaud's disease   . 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 Skin cancer 03/12/2014   basal- back   Social History   Socioeconomic History  . Marital status: Married    Spouse name: Not on file  . Number of children: Not on file  . Years of education: Not on file  . Highest education level: Not on file  Occupational History  . Not on file  Social Needs  . Financial resource strain: Not on file  . Food insecurity:    Worry: Not on file    Inability: Not on file  . Transportation needs:    Medical: Not on file    Non-medical: Not on file  Tobacco Use  . Smoking status: Former  Smoker    Packs/day: 1.00    Years: 10.00    Pack years: 10.00    Types: Cigarettes    Last attempt to quit: 11/25/2008    Years since quitting: 9.3  . Smokeless tobacco: Never Used  . Tobacco comment: on and off smoker never consistent  Substance and Sexual Activity  . Alcohol use: Yes    Comment: social  . Drug use: No  . Sexual activity: Not on file  Lifestyle  . Physical activity:    Days per week: Not on file    Minutes per session: Not on file  . Stress: Not on file  Relationships  . Social connections:    Talks on phone: Not on file    Gets together: Not on file    Attends religious service: Not on file    Active member of club or organization: Not on file    Attends meetings of clubs or organizations: Not on file    Relationship status: Not on file  Other Topics Concern  . Not on file  Social History Narrative  . Not on file   Family History  Problem Relation Age of Onset  . Lung cancer Father    Scheduled Meds: . amLODipine  5 mg Oral Daily  . dexamethasone  4 mg Intravenous Q6H  . gabapentin  1,200 mg Oral QHS  . gabapentin  600 mg Oral BID  . LORazepam  0.5 mg Oral QHS  . oxyCODONE  80 mg Oral Q8H  . traZODone  150 mg Oral QHS   Continuous Infusions: PRN Meds:.acetaminophen **OR** acetaminophen, hydrALAZINE, HYDROmorphone (DILAUDID) injection, ibuprofen, oxyCODONE, prochlorperazine Medications Prior to Admission:  Prior to Admission medications   Medication Sig Start Date End Date Taking? Authorizing Provider  amLODipine (NORVASC) 5 MG tablet Take 5 mg by mouth daily.   Yes [provider]  Ascorbic Acid (VITAMIN C) 1000 MG tablet Take 1,000 mg by mouth 2 (two) times daily.   Yes [provider]  carisoprodol (SOMA) 350 MG tablet Take 350 mg by mouth 3 (three) times daily.   Yes  [provider]  cetirizine (ZYRTEC) 10 MG tablet Take 10 mg by mouth daily.   Yes [provider]  Cholecalciferol (VITAMIN D3) 10000 units  capsule Take 10,000 Units by mouth daily.    Yes [provider]  escitalopram (LEXAPRO) 20 MG tablet Take 1 tablet (20 mg total) by mouth daily. 08/14/17  Yes Causey, Charlestine Massed, NP  everolimus (AFINITOR) 10 MG tablet Take 1 tablet (10 mg total) by mouth daily. 02/12/18  Yes Magrinat, Virgie Dad, MD  exemestane (AROMASIN) 25 MG tablet Take 1 tablet (25 mg total) by mouth daily after breakfast. 02/12/18  Yes Magrinat, Virgie Dad, MD  fluticasone (FLONASE) 50 MCG/ACT nasal spray Place 2 sprays into both nostrils daily. Reported on 11/18/2015   Yes [provider]  gabapentin (NEURONTIN) 300 MG capsule Take 4 capsules (1,200 mg total) by mouth at bedtime. TAKE 2 CAPSULES IN THE MORNING AND TAKE 2 CAPSULES MID-DAY AND TAKE 4CAPSULES AT BEDTIME 02/12/18  Yes Magrinat, Virgie Dad, MD  LORazepam (ATIVAN) 0.5 MG tablet Take 1 tablet (0.5 mg total) by mouth at bedtime. 10/03/17  Yes Magrinat, Virgie Dad, MD  Multiple Vitamin (MULTIVITAMIN) tablet Take 1 tablet by mouth daily.   Yes [provider]  ondansetron (ZOFRAN) 8 MG tablet Take 1 tablet (8 mg total) by mouth every 8 (eight) hours as needed for nausea or vomiting. 07/16/17  Yes Causey, Charlestine Massed, NP  oxyCODONE (OXYCONTIN) 80 mg 12 hr tablet Take 2 tablets (160 mg total) by mouth every 8 (eight) hours. 02/26/18  Yes Magrinat, Virgie Dad, MD  Oxycodone HCl 20 MG TABS Take 1-2 tablets (20-40 mg total) by mouth every 4 (four) hours as needed. 03/19/18  Yes Magrinat, Virgie Dad, MD  Probiotic Product (PROBIOTIC DAILY PO) Take 1 capsule by mouth daily. [place]   Yes [provider]  prochlorperazine (COMPAZINE) 10 MG tablet Take 1 tablet (10 mg total) by mouth every 6 (six) hours as needed (Nausea or vomiting). 08/14/17  Yes Causey, Charlestine Massed, NP  traZODone (DESYREL) 50 MG tablet TAKE 3 TABLETS AT BEDTIME 01/22/18  Yes Magrinat, Virgie Dad, MD  UNABLE TO FIND [facility]   Yes [provider]  UNABLE TO FIND [place] mushroom   Yes [provider]  UNABLE TO [place]   Yes [provider]  zolpidem (AMBIEN) 5 MG tablet Take 1 tablet (5 mg total) by mouth at bedtime as needed for sleep. for sleep 10/18/17  Yes Magrinat, Virgie Dad, MD  dexamethasone (DECADRON) 0.5 MG/5ML solution Swish 23m in mouth for 224m and spit out. Use 4 times daily for 8 weeks, start with Afinitor. Avoid eating/drinking for 1hr after rinse. 02/21/18   Magrinat, GuVirgie DadMD   Allergies  Allergen Reactions  . Sulfa Antibiotics Other (See Comments)    Blisters in mouth   Review of Systems  Constitutional: Positive for fatigue.  Neurological: Positive for dizziness and headaches.  All other systems reviewed and are negative.   Physical Exam  Constitutional: She is oriented to person, place, and time. Vital signs are normal. She appears well-developed. She is cooperative.  Cardiovascular: Normal rate, regular rhythm, normal heart sounds and normal pulses.  Pulmonary/Chest: Effort normal and breath sounds normal.  Abdominal: Soft. Bowel sounds are normal.  Neurological: She is alert and oriented to person, place, and time. Gait abnormal.  Dizziness, gait instability, neuropathy   Skin: Skin is warm, dry and intact.  Psychiatric: She  has a normal mood and affect. Her speech is normal and behavior is normal. Judgment and thought content normal. Cognition and memory are normal.  Nursing note and vitals reviewed.   Vital Signs: BP 110/66 (BP Location: Left Arm)   Pulse 92   Temp 99.3 F (37.4 C) (Oral)   Resp 18   Ht 5' 2"  (1.575 m)   Wt 68.9 kg (152 lb)   SpO2 97%   BMI 27.80 kg/m  Pain Scale: 0-10   Pain Score: 9    SpO2: SpO2: 97 % O2 Device:SpO2: 97 % O2 Flow Rate: .   IO: Intake/output summary:   Intake/Output Summary (Last 24 hours) at 03/20/2018 1647 Last data filed at 03/20/2018 1046 Gross per 24 hour  Intake 840 ml  Output -  Net 840  ml    LBM: Last BM Date: 03/19/18 Baseline Weight: Weight: 68.5 kg (151 lb) Most recent weight: Weight: 68.9 kg (152 lb)     Palliative Assessment/Data:PPS 50 %   Time In: 1500 Time Out: 1630 Time Total: 90 min.   Greater than 50%  of this time was spent counseling and coordinating care related to the above assessment and plan.  Signed by:  Alda Lea, NP-BC Palliative Medicine Team  Phone: [phone] Fax: [phone] Pager: [phone] Amion: Bjorn Pippin    Please contact Palliative Medicine Team phone at [phone] for questions and concerns.  For individual provider: See Shea Evans

## 2018-03-20 NOTE — Progress Notes (Signed)
PT Cancellation Note  Patient Details Name: Alison [name] MRN: [id number] DOB: 02/10/1961   Cancelled Treatment:    Reason Eval/Treat Not Completed: Pain limiting ability to participate Attempted to evaluate patient, she politely declines due to just having had pain medicine but requests that PT return later in morning. Will attempt to try back this morning as time allows.   Deniece Ree PT, DPT, CBIS  Supplemental Physical Therapist [facility]   Pager [phone]

## 2018-03-20 NOTE — Telephone Encounter (Signed)
I spoke with Ms. Astrid Drafts floor nurse, [name], requesting an order be placed for a total spine MRI to evaluate the extent of leptomeningeal disease. [name] was very helpful and agreed to enter that order on Dr. Pearlie Oyster behalf. I also reached out to Wadie Lessen from palliative care to request a palliative care consult be done today. Stanton Kidney said that one of her partners would see Ms. Fiorenza today.    Mont Dutton R.T.(R)(T) Special Procedures Navigator  [phone]

## 2018-03-20 NOTE — Evaluation (Signed)
Physical Therapy Evaluation Patient Details Name: Alison [name] MRN: [id number] DOB: Jan 11, 1961 Today's Date: 03/20/2018   History of Present Illness  57yo female who fell 1 week ago due to dizziness; she saw her MD, who performed Epley's maneuver, however she then fell again in the parking lot leaving that office due to dizziness. CC spinning and severe HA. 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 PMH anxiety, asthma, metastatic breast CA, hx radiation therapy, HTN, neuropathy   Clinical Impression   Patient received in bed, very pleasant and willing to participate with skilled PT services today; her PLOF is highly independent and she was able to perform all activities and tasks without an assistive device without difficulty. She does require general S and cues for managing dizziness/visual strategies with bed mobility, however required MinA and RW for functional transfers this session. Unable to assess gait due to dizziness and related safety concerns. Noted mild ataxic movements during transfers this session, RAM only very mildly impaired and sensation intact, MMT 5/5 in all tested groups. Discussed and practiced basic saccades exercises to assist in managing dizziness. She was left up in the chair with all needs met, alarm activated, and family present today.     Follow Up Recommendations Home health PT    Equipment Recommendations  Rolling walker with 5" wheels    Recommendations for Other Services       Precautions / Restrictions Precautions Precautions: Fall;Other (comment) Precaution Comments: severe dizziness with movement  Restrictions Weight Bearing Restrictions: No      Mobility  Bed Mobility Overal bed mobility: Needs Assistance Bed Mobility: Supine to Sit     Supine to sit: Supervision     General bed mobility comments: S for safety, cues  for visual strategies to manage dizziness   Transfers Overall transfer level: Needs assistance Equipment used: Rolling walker (2 wheeled) Transfers: Sit to/from [facility] Sit to Stand: Min assist Stand pivot transfers: Min assist       General transfer comment: MinA for balance assist and safety, VC for sequencing and safety and strategies to manage dizziness   Ambulation/Gait             General Gait Details: DNT due to dizziness/safety concerns   Stairs            Wheelchair Mobility    Modified Rankin (Stroke Patients Only)       Balance Overall balance assessment: Needs assistance Sitting-balance support: Bilateral upper extremity supported;Feet supported Sitting balance-Leahy Scale: Good     Standing balance support: Bilateral upper extremity supported;During functional activity Standing balance-Leahy Scale: Poor Standing balance comment: mild posterior LOB, ataxic, reliant on B UE support                              Pertinent Vitals/Pain Pain Assessment: 0-10 Pain Score: 4  Pain Location: headache  Pain Descriptors / Indicators: Aching Pain Intervention(s): Limited activity within patient's tolerance;Monitored during session;Premedicated before session;Repositioned    Home Living Family/patient expects to be discharged to:: Private residence Living Arrangements: Spouse/significant other Available Help at Discharge: Family Type of Home: House Home Access: Stairs to enter Entrance Stairs-Rails: None Entrance Stairs-Number of Steps: 2 no railings  Home Layout: Two level Home Equipment: None      Prior Function Level of Independence: Independent  Hand Dominance        Extremity/Trunk Assessment   Upper Extremity Assessment Upper Extremity Assessment: Defer to OT evaluation    Lower Extremity Assessment Lower Extremity Assessment: Overall WFL for tasks assessed    Cervical / Trunk  Assessment Cervical / Trunk Assessment: Normal  Communication   Communication: No difficulties  Cognition Arousal/Alertness: Awake/alert Behavior During Therapy: WFL for tasks assessed/performed Overall Cognitive Status: Within Functional Limits for tasks assessed                                        General Comments      Exercises     Assessment/Plan    PT Assessment Patient needs continued PT services  PT Problem List Decreased mobility;Decreased safety awareness;Decreased coordination;Decreased knowledge of precautions;Decreased activity tolerance;Decreased balance;Impaired sensation       PT Treatment Interventions DME instruction;Therapeutic activities;Gait training;Therapeutic exercise;Patient/family education;Stair training;Balance training;Functional mobility training;Neuromuscular re-education;Manual techniques;Other (comment)(vestibular interventions )    PT Goals (Current goals can be found in the Care Plan section)  Acute Rehab PT Goals Patient Stated Goal: to get better/less dizzy  PT Goal Formulation: With patient/family Time For Goal Achievement: 04/03/18 Potential to Achieve Goals: Good    Frequency Min 3X/week   Barriers to discharge        Co-evaluation               AM-PAC PT "6 Clicks" Daily Activity  Outcome Measure Difficulty turning over in bed (including adjusting bedclothes, sheets and blankets)?: A Little Difficulty moving from lying on back to sitting on the side of the bed? : A Little Difficulty sitting down on and standing up from a chair with arms (e.g., wheelchair, bedside commode, etc,.)?: A Little Help needed moving to and from a bed to chair (including a wheelchair)?: A Little Help needed walking in hospital room?: A Lot Help needed climbing 3-5 steps with a railing? : A Lot 6 Click Score: 16    End of Session Equipment Utilized During Treatment: Gait belt Activity Tolerance: Patient tolerated treatment  well Patient left: in chair;with call bell/phone within reach;with family/visitor present;with chair alarm set   PT Visit Diagnosis: Unsteadiness on feet (R26.81);Other abnormalities of gait and mobility (R26.89);History of falling (Z91.81);Ataxic gait (R26.0);Dizziness and giddiness (R42)    Time: 9833-8250 PT Time Calculation (min) (ACUTE ONLY): 28 min   Charges:   PT Evaluation $PT Eval Moderate Complexity: 1 Mod PT Treatments $Therapeutic Activity: 8-22 mins        Deniece Ree PT, DPT, CBIS  Supplemental Physical Therapist [place]   Pager [phone]

## 2018-03-21 ENCOUNTER — Ambulatory Visit
Admit: 2018-03-21 | Discharge: 2018-03-21 | Disposition: A | Discharge status: Home / Self Care | Payer: 59 | Attending: Radiation Oncology | Admitting: Radiation Oncology

## 2018-03-21 ENCOUNTER — Inpatient Hospital Stay (HOSPITAL_COMMUNITY): Payer: 59

## 2018-03-21 MED ORDER — OXYCODONE HCL ER 40 MG PO T12A
160.0000 mg | EXTENDED_RELEASE_TABLET | Freq: Three times a day (TID) | ORAL | Status: DC
  Administered 2018-03-21 – 2018-03-22 (×4): 160 mg via ORAL
  Filled 2018-03-21 (×4): qty 4

## 2018-03-21 MED ORDER — LORAZEPAM 2 MG/ML IJ SOLN
0.5000 mg | Freq: Once | INTRAMUSCULAR | Status: DC
  Filled 2018-03-21: qty 1

## 2018-03-21 MED ORDER — HYDROMORPHONE HCL 4 MG PO TABS
4.0000 mg | ORAL_TABLET | ORAL | Status: DC | PRN
  Administered 2018-03-21 (×3): 8 mg via ORAL
  Administered 2018-03-22 (×2): 4 mg via ORAL
  Filled 2018-03-21: qty 2
  Filled 2018-03-21: qty 1
  Filled 2018-03-21: qty 2
  Filled 2018-03-21: qty 1
  Filled 2018-03-21: qty 2

## 2018-03-21 MED ORDER — SENNOSIDES-DOCUSATE SODIUM 8.6-50 MG PO TABS
2.0000 | ORAL_TABLET | Freq: Two times a day (BID) | ORAL | Status: DC
  Administered 2018-03-21 – 2018-03-22 (×3): 2 via ORAL
  Filled 2018-03-21 (×2): qty 2

## 2018-03-21 MED ORDER — POLYETHYLENE GLYCOL 3350 17 G PO PACK
17.0000 g | PACK | Freq: Every day | ORAL | Status: DC
  Administered 2018-03-21: 17 g via ORAL
  Filled 2018-03-21: qty 1

## 2018-03-21 MED ORDER — POLYETHYLENE GLYCOL 3350 17 G PO PACK
17.0000 g | PACK | Freq: Two times a day (BID) | ORAL | Status: DC
  Filled 2018-03-21 (×2): qty 1

## 2018-03-21 MED ORDER — GADOBENATE DIMEGLUMINE 529 MG/ML IV SOLN
15.0000 mL | Freq: Once | INTRAVENOUS | Status: AC | PRN
  Administered 2018-03-21: 15 mL via INTRAVENOUS

## 2018-03-21 MED ORDER — HYDROMORPHONE HCL 1 MG/ML IJ SOLN
1.0000 mg | INTRAMUSCULAR | Status: DC | PRN
  Administered 2018-03-22 (×2): 1 mg via INTRAVENOUS
  Filled 2018-03-21 (×2): qty 1

## 2018-03-21 MED ORDER — OXYCODONE HCL 5 MG PO TABS
20.0000 mg | ORAL_TABLET | ORAL | Status: DC | PRN
  Administered 2018-03-21: 40 mg via ORAL
  Filled 2018-03-21: qty 8

## 2018-03-21 MED ORDER — MAGNESIUM CITRATE PO SOLN: 1.0000 | Freq: Every day | ORAL | Status: DC | PRN

## 2018-03-21 MED ORDER — DOCUSATE SODIUM 100 MG PO CAPS
200.0000 mg | ORAL_CAPSULE | Freq: Two times a day (BID) | ORAL | Status: DC
  Filled 2018-03-21: qty 2

## 2018-03-21 NOTE — Progress Notes (Addendum)
Pt have [employer]. [facility] [phone] was called to setup [facility] DME needs. [employer] ask for pt's name, [employer] policy number, DX code and MD NPI number or name. DX code and MD's name given to [place]. Intake number [id number]. Total time on the telephone with [facility] 71mins.

## 2018-03-21 NOTE — Progress Notes (Signed)
PROGRESS NOTE    Alison [name]  LTJ:[id number] DOB: 07-Aug-1961 DOA: 03/19/2018 PCP: [name], MD    Brief Narrative:  57 year old with past medical history relevant for widely metastatic breast cancer status post mastectomy on left, chemotherapy, radiation to spine, chronic pain, hypertension who presented with approximately 1 to 2 weeks of Vertigo, difficulty ambulatingAnd found to have metastatic disease to the brain.   Assessment & Plan:   Principal Problem:   Brain metastasis ([place]) Active Problems:   Malignant neoplasm of upper-outer quadrant of left breast in female, estrogen receptor positive ([place])   Bone metastases ([facility])   HTN (hypertension)   Chronic pain   #) Widely metastatic breast cancer with metastases to bone and brain: Patient is opted for aggressive treatment at this time.  She will see the new oncologist on 03/26/2018 with discussion of possible Ommaya placement by neurosurgery.  She will also seek a second opinion at [facility]. -P.o. pain control with OxyContin 160 mg every 8 hours, transition PRN oxycodone 20 to 40 mg every 4 hours as needed to hydromorphone 4 to 8 mg every 4 hours as needed -physical therapy recommends home health PT -oncology consultation, appreciate recommendations - Patient will be followed up as an outpatient for radiation oncology if they choose to do this -Palliative care consultation, appreciate recommendations -Continue IV dexamethasone 4 mg every 6 hours, discharged home on 8 mg twice daily and exemestane 25 mg p.o.  #) Pain/psych: -Continue lorazepam 0.5 mg nightly -Continue PRN oxycodone -Continue gabapentin 1200 mg nightly  #) Hypertension: -Continue amlodipine 5 mg  Fluids: Tolerating p.o. Elect lites: Monitor and supplement Nutrition: Regular diet  Prophylaxis: SCDs  Disposition: Pending PT evaluation and discussion of goals of care  Full code  Consultants:   Oncology  Palliative care  Procedures:    None  Antimicrobials:   None   Subjective: Patient reports she is doing much better.  She feels much more steady on her feet and her vertigo is resolved.  She reports that her head still hurts her great deal but the IV hydromorphone works and she would like transition from p.o. oxycodone as needed to p.o. hydromorphone as needed.  Objective: Vitals:   03/20/18 1423 03/20/18 2150 03/21/18 0453 03/21/18 1332  BP: 110/66 123/76 116/71 (!) 148/64  Pulse: 92 98 67 (!) 59  Resp: 18 16 16 16   Temp: 99.3 F (37.4 C) 97.8 F (36.6 C) 98.7 F (37.1 C) 97.9 F (36.6 C)  TempSrc: Oral Oral Oral Oral  SpO2: 97% 95% 94% 99%  Weight:      Height:        Intake/Output Summary (Last 24 hours) at 03/21/2018 1435 Last data filed at 03/21/2018 1333 Gross per 24 hour  Intake 480 ml  Output -  Net 480 ml   Filed Weights   03/19/18 0900 03/19/18 1454  Weight: 68.5 kg (151 lb) 68.9 kg (152 lb)    Examination:  General exam: Appears calm and comfortable  Respiratory system: Clear to auscultation. Respiratory effort normal. Cardiovascular system: Regular rate and rhythm, no murmurs Gastrointestinal system: Abdomen is nondistended, soft and nontender. No organomegaly or masses felt. Normal bowel sounds heard. Central nervous system: Alert and oriented.  Dysdiadochokinesia, dysmetria, Bilateral lower extremity strength is 5 out of 5. Extremities: No lower extremity edema Skin: No rashes Psychiatry: Judgement and insight appear normal. Mood & affect appropriate.     Data Reviewed: I have personally reviewed following labs and imaging studies  CBC: Recent  Labs  Lab 03/19/18 0754  WBC 4.4  NEUTROABS 3.0  HGB 12.7  HCT 36.8  MCV 98.6  PLT 465   Basic Metabolic Panel: Recent Labs  Lab 03/19/18 0754  NA 139  K 3.9  CL 103  CO2 26  GLUCOSE 115*  BUN 9  CREATININE 0.69  CALCIUM 8.8*   GFR: Estimated Creatinine Clearance: 69.7 mL/min (by C-G formula based on SCr of 0.69  mg/dL). Liver Function Tests: Recent Labs  Lab 03/19/18 0754  AST 25  ALT 19  ALKPHOS 70  BILITOT 0.4  PROT 6.9  ALBUMIN 3.7   No results for input(s): LIPASE, AMYLASE in the last 168 hours. No results for input(s): AMMONIA in the last 168 hours. Coagulation Profile: No results for input(s): INR, PROTIME in the last 168 hours. Cardiac Enzymes: No results for input(s): CKTOTAL, CKMB, CKMBINDEX, TROPONINI in the last 168 hours. BNP (last 3 results) No results for input(s): PROBNP in the last 8760 hours. HbA1C: No results for input(s): HGBA1C in the last 72 hours. CBG: No results for input(s): GLUCAP in the last 168 hours. Lipid Profile: No results for input(s): CHOL, HDL, LDLCALC, TRIG, CHOLHDL, LDLDIRECT in the last 72 hours. Thyroid Function Tests: No results for input(s): TSH, T4TOTAL, FREET4, T3FREE, THYROIDAB in the last 72 hours. Anemia Panel: No results for input(s): VITAMINB12, FOLATE, FERRITIN, TIBC, IRON, RETICCTPCT in the last 72 hours. Sepsis Labs: No results for input(s): PROCALCITON, LATICACIDVEN in the last 168 hours.  No results found for this or any previous visit (from the past 240 hour(s)).       Radiology Studies: No results found.      Scheduled Meds: . amLODipine  5 mg Oral Daily  . dexamethasone  4 mg Intravenous Q6H  . gabapentin  1,200 mg Oral QHS  . gabapentin  600 mg Oral BID  . LORazepam  0.5 mg Oral QHS  . oxyCODONE  160 mg Oral Q8H  . polyethylene glycol  17 g Oral BID  . senna-docusate  2 tablet Oral BID  . traZODone  150 mg Oral QHS   Continuous Infusions:   LOS: 2 days    Time spent: [place], MD [facility]  If 7PM-7AM, please contact night-coverage [email or web address] Password [facility] 03/21/2018, 2:35 PM

## 2018-03-21 NOTE — Progress Notes (Signed)
Alison [name]   DOB:[birth year]   YD#:[id number]   EHM#:[id number]  Subjective:  Alison [name] feels much better today; vertigo almost gone; can sit up, use BSC with assistance; still has h/a and feels dilaudid needs to be liberalized; no BM yet; husband in room  Objective: Middle-aged white [name] examined in bed Vitals:   03/20/18 2150 03/21/18 0453  BP: 123/76 116/71  Pulse: 98 67  Resp: 16 16  Temp: 97.8 F (36.6 C) 98.7 F (37.1 C)  SpO2: 95% 94%    Body mass index is 27.8 kg/m.  Intake/Output Summary (Last 24 hours) at 03/21/2018 0706 Last data filed at 03/20/2018 1046 Gross per 24 hour  Intake 360 ml  Output -  Net 360 ml    Sclerae unicteric, EOMs intact, full visual field Lungs no rales or rhonchi Heart regular rate and rhythm Abd soft, nontender, positive bowel sounds Neuro: alert, well oriented, normal speech, positive affect Breasts: Deferred    CBG (last 3)  No results for input(s): GLUCAP in the last 72 hours.   Labs:  Lab Results  Component Value Date   WBC 4.4 03/19/2018   HGB 12.7 03/19/2018   HCT 36.8 03/19/2018   MCV 98.6 03/19/2018   PLT 216 03/19/2018   NEUTROABS 3.0 03/19/2018    @LASTCHEMISTRY @  Urine Studies No results for input(s): UHGB, CRYS in the last 72 hours.  Invalid input(s): UACOL, UAPR, USPG, UPH, UTP, UGL, UKET, UBIL, UNIT, UROB, ULEU, UEPI, UWBC, URBC, UBAC, CAST, UCOM, BILUA  Basic Metabolic Panel: Recent Labs  Lab 03/19/18 0754  NA 139  K 3.9  CL 103  CO2 26  GLUCOSE 115*  BUN 9  CREATININE 0.69  CALCIUM 8.8*   GFR Estimated Creatinine Clearance: 69.7 mL/min (by C-G formula based on SCr of 0.69 mg/dL). Liver Function Tests: Recent Labs  Lab 03/19/18 0754  AST 25  ALT 19  ALKPHOS 70  BILITOT 0.4  PROT 6.9  ALBUMIN 3.7   No results for input(s): LIPASE, AMYLASE in the last 168 hours. No results for input(s): AMMONIA in the last 168 hours. Coagulation profile No results for input(s): INR, PROTIME in the last  168 hours.  CBC: Recent Labs  Lab 03/19/18 0754  WBC 4.4  NEUTROABS 3.0  HGB 12.7  HCT 36.8  MCV 98.6  PLT 216   Cardiac Enzymes: No results for input(s): CKTOTAL, CKMB, CKMBINDEX, TROPONINI in the last 168 hours. BNP: Invalid input(s): POCBNP CBG: No results for input(s): GLUCAP in the last 168 hours. D-Dimer No results for input(s): DDIMER in the last 72 hours. Hgb A1c No results for input(s): HGBA1C in the last 72 hours. Lipid Profile No results for input(s): CHOL, HDL, LDLCALC, TRIG, CHOLHDL, LDLDIRECT in the last 72 hours. Thyroid function studies No results for input(s): TSH, T4TOTAL, T3FREE, THYROIDAB in the last 72 hours.  Invalid input(s): FREET3 Anemia work up No results for input(s): VITAMINB12, FOLATE, FERRITIN, TIBC, IRON, RETICCTPCT in the last 72 hours. Microbiology No results found for this or any previous visit (from the past 240 hour(s)).    Studies:  Ct Cervical Spine Wo Contrast  Result Date: 03/19/2018 CLINICAL DATA:  Neck pain after a fall today. Near syncopal episode. Headache. Vertigo for 2 weeks. History of metastatic breast cancer. EXAM: CT CERVICAL SPINE WITHOUT CONTRAST TECHNIQUE: Multidetector CT imaging of the cervical spine was performed without intravenous contrast. Multiplanar CT image reconstructions were also generated. COMPARISON:  PET-CT 02/03/2018 and cervical spine CT 02/26/2017 FINDINGS: Alignment: Chronic mild reversal  of the normal cervical lordosis. Unchanged slight anterolisthesis of C4 on C5 and C5 on C6. Skull base and vertebrae: Widespread, predominantly sclerotic osseous metastases have progressed from the 2018 cervical spine CT but are similar to the more recent PET-CT. No acute fracture is identified. Soft tissues and spinal canal: No gross canal hematoma or epidural tumor on this unenhanced study. No prevertebral fluid. Disc levels: Similar degenerative changes compared to the 2018 study. Mild disc space narrowing and spurring  at C5-6 and C6-7. Multilevel facet arthrosis, most severe on the left at C4-5 resulting in mild left neural foraminal stenosis. No high-grade osseous neural foraminal or spinal canal stenosis identified. Upper chest: Clear lung apices. Other: Mild right and moderate left carotid bifurcation calcified atherosclerosis. IMPRESSION: 1. No acute osseous abnormality identified in the cervical spine. 2. Known widespread osseous metastatic disease. Electronically Signed   By: Logan Bores M.D.   On: 03/19/2018 11:17   Mr Jeri Cos And Wo Contrast  Result Date: 03/19/2018 CLINICAL DATA:  Recent near syncopal episode. Possible cerebellar lesion on outside head CT. Vertigo. Fall today. History of breast cancer with osseous metastases. EXAM: MRI HEAD WITHOUT AND WITH CONTRAST TECHNIQUE: Multiplanar, multiecho pulse sequences of the brain and surrounding structures were obtained without and with intravenous contrast. CONTRAST:  62m MULTIHANCE GADOBENATE DIMEGLUMINE 529 MG/ML IV SOLN COMPAR[facility]:  [facility] FINDINGS: Some sequences are mildly motion degraded. Brain: There is edema throughout the superior and medial cerebellar hemispheres bilaterally with involvement of the vermis, posterior pons, and tectum. 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 There is also leptomeningeal enhancement in the left occipital lobe and interpeduncular cistern. Masslike extra-axial enhancement in the pineal region measures 2.1 x 1.2 cm. There is likely a small amount of leptomeningeal enhancement along the ventral pons and medulla, and there is abnormal enhancement along the left seventh and eighth cranial nerve complex in the internal auditory canal. There is also abnormal enhancement involving Meckel's cave on the left and likely involving the cisternal segment of the left trigeminal. T2 shine through is noted in the  cerebellum on diffusion imaging. No definite acute infarct is identified. There is no evidence of intracranial hemorrhage, midline shift, or extra-axial fluid collection. Posterior fossa edema partially effaces the fourth ventricle. The lateral and third ventricles are normal in size without evidence of obstructive hydrocephalus. A few small foci of cerebral white matter T2 hyperintensity are nonspecific. Vascular: Major intracranial vascular flow voids are preserved. Skull and upper cervical spine: Known widespread osseous metastatic disease involving the skull and included upper cervical spine. Extensive skull base involvement including of the clivus. Sinuses/Orbits: Unremarkable orbits. Clear paranasal sinuses. Trace left mastoid effusion. Other: None. IMPRESSION: 1. 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 Cerebellar edema with partial fourth ventricle effacement. No evidence of obstructive hydrocephalus at this time. 2. Extensive osseous metastases. Electronically Signed   By: ALogan BoresM.D.   On: 03/19/2018 12:36   Assessment: 57y.o. Alison [name] status post biopsy of 2 separate masses in the left breast upper outer quadrant 08/04/2015, clinically mT2 N1, stage IIb/IIIa invasive ductal carcinoma, grade 3, the larger mass being estrogen and progesterone receptor positive, HER-2 negative, with an MIB-1 of 12%             (a) biopsy of a left axillary lymph node 08/09/2015 was positive, with extracapsular extension  (  1) neoadjuvant chemotherapy started 08/25/2015 consisting of doxorubicin and cyclophosphamide in dose dense fashion 4, completed 10/06/2015,  followed by weekly paclitaxel 10 completed 12/23/2015             (a) final 2 planned cycles of weekly paclitaxel omitted because of neuropathy concerns  (2) status post left mastectomy with targeted axillary dissection 02/02/2016 for a ypT2 ypN2a,  stage IIIa invasive ductal carcinoma, grade 2, with negative margins, repeat prognostic panel showing estrogen receptor positive, but progesterone receptor and HER-2 negative             (a) completion axillary lymph node dissection 02/14/2016 showed an additional 2 out of 8 lymph nodes to be involved (final count 6 out of 10 lymph nodes positive             (b) expander placement at the time of left mastectomy  (3) adjuvant capecitabine starting concurrently with radiation--discontinued 05/21/2016  (4) adjuvant  radiation started 04/02/2016, completed 05/21/2016  (5) anastrozole started October 2017, discontinued January 2019             (a) bone density August 2016 showed osteopenia             (b) repeat bone density 09/07/2016 shows a T score of -1.7.             (c) the patient is status post total abdominal hysterectomy with bilateral salpingo-oophorectomy.  (6) chronic pain: Starting OxyContin 20 mg twice a day with oxycodone as needed for breakthrough pain 03/02/2016             (a) failed transition to tramadol, gabapentin, naproxen              (b) started methadone 5 mg TID as of 12/20/2016-- not tolerated             (c) on OxyContin and Oxycodone as discussed below  (7) Research:             (a) B-WEL study ([facility] [id number])--enrolled in Arm 1 (received education)             (b) PALLAS AFT-05 study: randomized to treatment, started palbociclib 07/26/2016                         (i) dose reduced to 100 mg, then to 75 mg because of cytopenias                         (ii) went off study study May 2018 because of persistent cytopenias             (c) taxane study 12/20/2016             (d) aspirin study             (e) went off studies as metastatic disease developed in June 2018  METASTATIC DISEASE 02/15/2017: CT scan of the lumbar spine and 02/27/2017 CT of the thoracic and cervical spine shows multiple bone lesions but no evidence of cord compromise; bone lesions are  confirmed on PET scan 02/22/2017 which however shows no evidence of visceral disease             (a) CA-27-29 is noninformative             (b) left upper lobe changes noted on PET scan 02/22/2017 felt to be secondary to radiation             (  c)repeat PET scan 09/05/2017 shows again no visceral disease, stable bony metastases             (d) PET scan 02/03/2018 shows no visceral disease, new bone lesions  (8) continued anastrozole, abemaciclib added 02/22/2017, taken 2 weeks on, 2 weeks off              (a) fulvestrant started 02/22/2017 ,              (b) anastrozole discontinued January 2019             (c) fulvestrant and abemaciclib discontinued June 2019 with progression  (9) yearly zolendronate given 09/26/2016, changed to monthly denosumab/Xgeva starting 03/19/2017  (10) radiation 04/01/2017 - 04/23/2017 Site/dose:The lumbar spine was treated to 35 Gy in 14 fractions of 2.5 Gy.   (11) chronic pain: Currently on OxyContin 160 grams p.o. TID/ oxycodone 20-40 mg Q 4h PRN             (i) bowel prophylaxis in place             (ii) also followed by Dr. 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 She and her husband Alison [name] are interested in aggressive treatment. Plan at this point is:  Neuro-oncology conference Monday 07/29  See neuro-oncologist Dr Mickeal Skinner 07/31  Second opinion at [place]-- Dr Erline Levine  Ommaya placement per [facility]  We will take care of above plans.  Patient likely will go home next 24-48 hours. Would discharge on dexamethasone 8 mg po BID and exemestane 25 mg po. Pain meds as before (oxycontin 160 mg po Q8h, oxycodone 20 mg 1-2 po Q4h PRN). May need walker, BSC, etc at your discretion.  She has an appt w me 08/09.  I will be out of town next week. Please consult my partners as  needed.  Appreciate your help to this patient and her family!   Chauncey Cruel, MD 03/21/2018  7:06 AM Medical Oncology and Hematology [facility] [address] [place], [place] [address] Tel. [phone]    Fax. [phone]

## 2018-03-21 NOTE — Progress Notes (Signed)
Physical Therapy Treatment Patient Details Name: Alison [name] MRN: [id number] DOB: May 17, 1961 Today's Date: 03/21/2018    History of Present Illness 57yo female who fell 1 week ago due to dizziness; she saw her MD, who performed Epley's maneuver, however she then fell again in the parking lot leaving that office due to dizziness. CC spinning and severe HA. 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 PMH anxiety, asthma, metastatic breast CA, hx radiation therapy, HTN, neuropathy     PT Comments    Patient received in bed with bed alarm going off, patient explains she has just gotten back from going to the bathroom. Education provided regarding having staff present instead of having family assist due to safety concerns. Able to perform bed mobility today with ModI, functional transfers with Min guard, and gait approximately 150f with RW and Min guard. Attempted gait without device but patient very unsteady and with poor safety, requiring return to assistive device. Further education provided regarding stair navigation as well as safe set up of house prior to return home, and PT recommendations for HHPT/RW/shower bench. She was left up in the chair with alarm activated, family present, and all other needs met this morning.     Follow Up Recommendations  Home health PT     Equipment Recommendations  Rolling walker with 5" wheels;3in1 (PT);Other (comment)(shower bench )    Recommendations for Other Services       Precautions / Restrictions Precautions Precautions: Fall;Other (comment) Precaution Comments: dizziness with movement (improving) Restrictions Weight Bearing Restrictions: No    Mobility  Bed Mobility Overal bed mobility: Modified Independent Bed Mobility: Supine to Sit     Supine to sit: Modified independent (Device/Increase time)         Transfers Overall transfer level: Needs assistance Equipment used: Rolling walker (2 wheeled) Transfers: Sit to/from Stand Sit to Stand: Min guard         General transfer comment: cues for safety and sequencing   Ambulation/Gait Ambulation/Gait assistance: Min guard;Min assist Gait Distance (Feet): 120 Feet Assistive device: Rolling walker (2 wheeled) Gait Pattern/deviations: Step-through pattern;Decreased stride length;Trendelenburg;Narrow base of support     General Gait Details: able to ambulate in hallway with RW, Min guard, and occasional cues for obstacle navigation; attempted gait without RW but very unsteady and requires MinA to maintain balance    Stairs             Wheelchair Mobility    Modified Rankin (Stroke Patients Only)       Balance Overall balance assessment: Needs assistance Sitting-balance support: Bilateral upper extremity supported;Feet supported Sitting balance-Leahy Scale: Good     Standing balance support: Bilateral upper extremity supported;During functional activity Standing balance-Leahy Scale: Poor Standing balance comment: gross unsteadiness, difficulty with turns, heavy reliance on B UE support                             Cognition Arousal/Alertness: Awake/alert Behavior During Therapy: WFL for tasks assessed/performed Overall Cognitive Status: Within Functional Limits for tasks assessed                                        Exercises      General Comments        Pertinent Vitals/Pain Pain Assessment: 0-10 Pain Score:  6  Pain Location: headache  Pain Descriptors / Indicators: Aching Pain Intervention(s): Limited activity within patient's tolerance;Monitored during session;Premedicated before session    Home Living                      Prior Function            PT Goals (current goals can now be found in the care plan section) Acute Rehab PT Goals Patient Stated Goal: to  get better/less dizzy  PT Goal Formulation: With patient/family Time For Goal Achievement: 04/03/18 Potential to Achieve Goals: Good Progress towards PT goals: Progressing toward goals    Frequency    Min 3X/week      PT Plan Current plan remains appropriate    Co-evaluation              AM-PAC PT "6 Clicks" Daily Activity  Outcome Measure  Difficulty turning over in bed (including adjusting bedclothes, sheets and blankets)?: None Difficulty moving from lying on back to sitting on the side of the bed? : None Difficulty sitting down on and standing up from a chair with arms (e.g., wheelchair, bedside commode, etc,.)?: A Little Help needed moving to and from a bed to chair (including a wheelchair)?: A Little Help needed walking in hospital room?: A Little Help needed climbing 3-5 steps with a railing? : A Little 6 Click Score: 20    End of Session Equipment Utilized During Treatment: Gait belt Activity Tolerance: Patient tolerated treatment well Patient left: in chair;with chair alarm set;with call bell/phone within reach;with family/visitor present   PT Visit Diagnosis: Unsteadiness on feet (R26.81);Other abnormalities of gait and mobility (R26.89);History of falling (Z91.81);Ataxic gait (R26.0);Dizziness and giddiness (R42)     Time: 6285-4965 PT Time Calculation (min) (ACUTE ONLY): 23 min  Charges:  $Gait Training: 8-22 mins $Self Care/Home Management: 8-22                     Deniece Ree PT, DPT, CBIS  Supplemental Physical Therapist [place]   Pager [phone]

## 2018-03-21 NOTE — Progress Notes (Signed)
[facility] orders with H&P faxed to [facility]. Rowland Lathe with [facility] was called [phone] ext [phone] to confirm [facility] orders.

## 2018-03-22 MED ORDER — GETGO ROLLING WALKER MISC: 1.0000 [IU] | 0 refills | Status: AC | PRN

## 2018-03-22 MED ORDER — HYDROMORPHONE HCL 8 MG PO TABS: 8.0000 mg | ORAL_TABLET | ORAL | 0 refills | Status: DC | PRN

## 2018-03-22 MED ORDER — OXYCODONE HCL ER 60 MG PO T12A: 180.0000 mg | EXTENDED_RELEASE_TABLET | Freq: Three times a day (TID) | ORAL | 0 refills | Status: DC

## 2018-03-22 MED ORDER — MECLIZINE HCL 25 MG PO TABS: 25.0000 mg | ORAL_TABLET | Freq: Three times a day (TID) | ORAL | 0 refills | Status: DC | PRN

## 2018-03-22 MED ORDER — DEXAMETHASONE 4 MG PO TABS: 8.0000 mg | ORAL_TABLET | Freq: Two times a day (BID) | ORAL | 0 refills | Status: DC

## 2018-03-22 NOTE — Care Management Note (Signed)
Case Management Note  Patient Details  Name: Alison [name] MRN: [id number] Date of Birth: Feb 20, 1961  Subjective/Objective:   Breast Cancer with mets brain                 Action/Plan: NCM spoke to pt and explained orders for [facility] and RW with seat were faxed to [facility], provider for [facility]. Will fax dc summary, orders HH and RW with seat to [facility] to make aware of dc home with services needed to be arranged. Pt states her husband purchased a RW for her to have at home.   Expected Discharge Date:  03/22/18               Expected Discharge Plan:  [place]  In-House Referral:  NA  Discharge planning Services  CM Consult  Post Acute Care Choice:  Home Health Choice offered to:     DME Arranged:  Walker rolling with seat DME Agency:  NA  HH Arranged:  RN, PT HH Agency:  Other - See comment(Pt have [employer] who will arange.)  Status of Service:  Completed, signed off  If discussed at [place] of Stay Meetings, dates discussed:    Additional Comments:  Erenest Rasher, RN 03/22/2018, 2:30 PM

## 2018-03-22 NOTE — Progress Notes (Signed)
Pt discharged. Patient in stable condition and scheduled pain meds were given in advance.  Paperwork was explained and signed.

## 2018-03-22 NOTE — Discharge Instructions (Signed)
Metastatic Brain Tumor  A metastatic brain tumor is a growth in your brain. It is made of cancer cells from another part of your body that traveled to your brain through your bloodstream, along the nerves of your brain (cranial nerves), or through the opening at the base of your skull.  What are the causes?  The most common cause of a metastatic brain tumor in men is lung cancer. In women, it is breast cancer. Other common causes include:   Unknown types of cancers.   Skin cancer (melanoma).   Colon cancer.   Kidney cancer.    What are the signs or symptoms?  Most signs and symptoms of metastatic brain tumors are caused by increased pressure inside your brain. A headache is often the first symptom. Eventually, almost everyone with a metastatic brain tumor has a headache. Other signs and symptoms include:   Vomiting.   Weakness or fatigue.   Seizures.   Sensory problems, like tingling or numbness.   Problems walking.   Clumsiness.   Emotional changes.   Personality changes.   Changes in speech or vision.    How is this diagnosed?  Your health care provider can diagnose a metastatic brain tumor from your medical history and physical exam. You may also have some additional tests, including:   A nervous system function test (neurologic exam).   Imaging studies of your brain, such as an MRI and CT scan.   Having a small piece of tissue removed (biopsy) from your brain to be checked under a microscope.    How is this treated?  Treatment depends on the type of cancer you have and your overall health. It also depends on the size of your tumor and the number of brain tumors you have. The most common treatments include:   Medicines to control pain, nausea, and seizures.   Cancer-killing drugs (chemotherapy).   An X-ray treatment called radiation therapy to kill brain tumor cells.   A type of radiation therapy that is targeted to exact locations in the brain (stereotactic radiotherapy).   Surgery to remove  brain tumors.    Follow these instructions at home:   Only take medicines as directed by your health care provider.   Do not drive if:  ? You are taking strong pain medicines.  ? Your vision or thinking is not clear.   Take two 10-minute walks every day if you are able. Exercising is a good way to relieve stress. It can also help you sleep.   Be sure to get enough calories and protein in your diet.  ? If you have a poor appetite or feel nauseous, eat small meals often.  ? Supplement your diet with protein shakes or milk shakes.   It is common to have anxiety and depression when you are coping with cancer.  ? Talk to friends and loved ones about your feelings.  ? Have a good support system at home.  Contact a health care provider if:   You have chills or fever.   Your medicine is not controlling your symptoms.   Your symptoms get worse or you develop new symptoms.   You are having trouble caring for yourself or being cared for at home.   You are struggling with anxiety or depression.  Get help right away if:   You cannot stop vomiting.   You cannot keep down any foods or fluids.   You have sudden changes in speech or vision.   You have   a seizure.   You can no longer take care of yourself at home.  This information is not intended to replace advice given to you by your health care provider. Make sure you discuss any questions you have with your health care provider.  Document Released: 05/09/2004 Document Revised: 01/19/2016 Document Reviewed: 08/04/2013  [employer] Interactive Patient Education  2017 [employer]

## 2018-03-22 NOTE — Discharge Summary (Signed)
Physician Discharge Summary  Alison [name] PPJ:[id number] DOB: Jun 30, 1961 DOA: 03/19/2018  PCP: [name], MD  Admit date: 03/19/2018 Discharge date: 03/22/2018  Admitted From: Home Disposition: Home  Recommendations for Outpatient Follow-up:  1. Follow up with PCP in 1-2 weeks 2. Please obtain BMP/CBC in one week 3.  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  She was seen by palliative care and opted for aggressive therapy.  Patient was seen by oncology and neuro radiology and they would follow-up as an outpatient to discuss options including placement of an [place] reservoir or additional radiation to the brain.  She was started on IV dexamethasone 4 mg every 6 hours and discharged home on oral Dex Methasone 8 mg twice daily.  She was continued on her exemestane.  She will seek an additional second opinion at [facility].  She was evaluated by physical therapy recommended home health PT and rolling walker which were ordered.  For pain control her home OxyContin was increased to 180 mg 3 times daily.  She felt better relief with hydromorphone and so her PRN oxycodone was discontinued and she was transitioned to PRN hydromorphone 8 mg every 4 hours as needed.  She is given enough supply to follow-up with her oncologist.  #) Pain/psych: Patient was continued on lorazepam and gabapentin 1200 mg nightly.  #) Hypertension: Patient was continue amlodipine 5 mg.  Discharge Diagnoses:  Principal Problem:   Brain metastasis ([place]) Active Problems:   Malignant neoplasm of upper-outer quadrant of left breast in female, estrogen receptor positive ([place])   Bone metastases  (H[facility])   HTN (hypertension)   Chronic pain    Discharge Instructions  Discharge Instructions    Call MD for:  hives   Complete by:  As directed    Call MD for:  persistant nausea and vomiting   Complete by:  As directed    Call MD for:  redness, tenderness, or signs of infection (pain, swelling, redness, odor or green/yellow discharge around incision site)   Complete by:  As directed    Call MD for:  severe uncontrolled pain   Complete by:  As directed    Call MD for:  temperature >100.4   Complete by:  As directed    Diet - low sodium heart healthy   Complete by:  As directed    Discharge instructions   Complete by:  As directed    Please follow-up with your oncologist and your radiation oncologist as an outpatient.   Increase activity slowly   Complete by:  As directed      Allergies as of 03/22/2018      Reactions   Sulfa Antibiotics Other (See Comments)   Blisters in mouth      Medication List    STOP taking these medications   dexamethasone 0.5 MG/5ML solution Commonly known as:  DECADRON Replaced by:  dexamethasone 4 MG tablet   UNABLE TO FIND   UNABLE TO FIND   UNABLE TO FIND     TAKE these medications   amLODipine 5 MG tablet Commonly known as:  NORVASC Take 5 mg by mouth daily.   carisoprodol 350 MG tablet Commonly known as:  SOMA Take 350 mg by mouth  3 (three) times daily.   cetirizine 10 MG tablet Commonly known as:  ZYRTEC Take 10 mg by mouth daily.   dexamethasone 4 MG tablet Commonly known as:  DECADRON Take 2 tablets (8 mg total) by mouth 2 (two) times daily. Replaces:  dexamethasone 0.5 MG/5ML solution   escitalopram 20 MG tablet Commonly known as:  LEXAPRO Take 1 tablet (20 mg total) by mouth daily.   everolimus 10 MG tablet Commonly known as:  AFINITOR Take 1 tablet (10 mg total) by mouth daily.   exemestane 25 MG tablet Commonly known as:  AROMASIN Take 1 tablet (25 mg total) by mouth daily after breakfast.   fluticasone  50 MCG/ACT nasal spray Commonly known as:  FLONASE Place 2 sprays into both nostrils daily. Reported on 11/18/2015   gabapentin 300 MG capsule Commonly known as:  NEURONTIN Take 4 capsules (1,200 mg total) by mouth at bedtime. TAKE 2 CAPSULES IN THE MORNING AND TAKE 2 CAPSULES MID-DAY AND TAKE 4CAPSULES AT BEDTIME   GETGO ROLLING WALKER Misc 1 Units by Does not apply route continuous as needed.   HYDROmorphone 8 MG tablet Commonly known as:  DILAUDID Take 1 tablet (8 mg total) by mouth every 4 (four) hours as needed for moderate pain (4mg  for moderate 3-6/10, 8mg  for >6/10 pain).   LORazepam 0.5 MG tablet Commonly known as:  ATIVAN Take 1 tablet (0.5 mg total) by mouth at bedtime.   meclizine 25 MG tablet Commonly known as:  ANTIVERT Take 1 tablet (25 mg total) by mouth 3 (three) times daily as needed for dizziness.   multivitamin tablet Take 1 tablet by mouth daily.   ondansetron 8 MG tablet Commonly known as:  ZOFRAN Take 1 tablet (8 mg total) by mouth every 8 (eight) hours as needed for nausea or vomiting.   oxyCODONE 60 MG 12 hr tablet Commonly known as:  OXYCONTIN Take 180 mg by mouth every 8 (eight) hours. What changed:    medication strength  how much to take  when to take this  Another medication with the same name was removed. Continue taking this medication, and follow the directions you see here.   PROBIOTIC DAILY PO Take 1 capsule by mouth daily. Renew Life Extra Care Ultimate Flora 50 Billion   prochlorperazine 10 MG tablet Commonly known as:  COMPAZINE Take 1 tablet (10 mg total) by mouth every 6 (six) hours as needed (Nausea or vomiting).   traZODone 50 MG tablet Commonly known as:  DESYREL TAKE 3 TABLETS AT BEDTIME   vitamin C 1000 MG tablet Take 1,000 mg by mouth 2 (two) times daily.   Vitamin D3 10000 units capsule Take 10,000 Units by mouth daily.   zolpidem 5 MG tablet Commonly known as:  AMBIEN Take 1 tablet (5 mg total) by mouth at  bedtime as needed for sleep. for sleep            Durable Medical Equipment  (From admission, onward)        Start     Ordered   03/21/18 1233  For home use only DME 4 wheeled rolling walker with seat  Once    Question:  Patient needs a walker to treat with the following condition  Answer:  Fear for personal safety   03/21/18 1233      Allergies  Allergen Reactions  . Sulfa Antibiotics Other (See Comments)    Blisters in mouth    Consultations: Oncology Radiation oncology Palliative care  Procedures/Studies: Ct Cervical  Spine Wo Contrast  Result Date: 03/19/2018 CLINICAL DATA:  Neck pain after a fall today. Near syncopal episode. Headache. Vertigo for 2 weeks. History of metastatic breast cancer. EXAM: CT CERVICAL SPINE WITHOUT CONTRAST TECHNIQUE: Multidetector CT imaging of the cervical spine was performed without intravenous contrast. Multiplanar CT image reconstructions were also generated. COMPARISON:  PET-CT 02/03/2018 and cervical spine CT 02/26/2017 FINDINGS: Alignment: Chronic mild reversal of the normal cervical lordosis. Unchanged slight anterolisthesis of C4 on C5 and C5 on C6. Skull base and vertebrae: Widespread, predominantly sclerotic osseous metastases have progressed from the 2018 cervical spine CT but are similar to the more recent PET-CT. No acute fracture is identified. Soft tissues and spinal canal: No gross canal hematoma or epidural tumor on this unenhanced study. No prevertebral fluid. Disc levels: Similar degenerative changes compared to the 2018 study. Mild disc space narrowing and spurring at C5-6 and C6-7. Multilevel facet arthrosis, most severe on the left at C4-5 resulting in mild left neural foraminal stenosis. No high-grade osseous neural foraminal or spinal canal stenosis identified. Upper chest: Clear lung apices. Other: Mild right and moderate left carotid bifurcation calcified atherosclerosis. IMPRESSION: 1. No acute osseous abnormality identified  in the cervical spine. 2. Known widespread osseous metastatic disease. Electronically Signed   By: Logan Bores M.D.   On: 03/19/2018 11:17   Mr Jeri Cos And Wo Contrast  Result Date: 03/19/2018 CLINICAL DATA:  Recent near syncopal episode. Possible cerebellar lesion on outside head CT. Vertigo. Fall today. History of breast cancer with osseous metastases. EXAM: MRI HEAD WITHOUT AND WITH CONTRAST TECHNIQUE: Multiplanar, multiecho pulse sequences of the brain and surrounding structures were obtained without and with intravenous contrast. CONTRAST:  60mL MULTIHANCE GADOBENATE DIMEGLUMINE 529 MG/ML IV SOLN COMPARISON:  Head CT report from [facility] dated 03/10/2018 FINDINGS: Some sequences are mildly motion degraded. Brain: There is edema throughout the superior and medial cerebellar hemispheres bilaterally with involvement of the vermis, posterior pons, and tectum. 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 There is also leptomeningeal enhancement in the left occipital lobe and interpeduncular cistern. Masslike extra-axial enhancement in the pineal region measures 2.1 x 1.2 cm. There is likely a small amount of leptomeningeal enhancement along the ventral pons and medulla, and there is abnormal enhancement along the left seventh and eighth cranial nerve complex in the internal auditory canal. There is also abnormal enhancement involving Meckel's cave on the left and likely involving the cisternal segment of the left trigeminal. T2 shine through is noted in the cerebellum on diffusion imaging. No definite acute infarct is identified. There is no evidence of intracranial hemorrhage, midline shift, or extra-axial fluid collection. Posterior fossa edema partially effaces the fourth ventricle. The lateral and third ventricles are normal in size without evidence of obstructive hydrocephalus. A few small foci of cerebral white matter T2  hyperintensity are nonspecific. Vascular: Major intracranial vascular flow voids are preserved. Skull and upper cervical spine: Known widespread osseous metastatic disease involving the skull and included upper cervical spine. Extensive skull base involvement including of the clivus. Sinuses/Orbits: Unremarkable orbits. Clear paranasal sinuses. Trace left mastoid effusion. Other: None. IMPRESSION: 1. 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 Cerebellar edema with partial fourth ventricle effacement. No evidence of obstructive hydrocephalus at this time. 2. Extensive osseous metastases. Electronically Signed   By: Logan Bores M.D.   On: 03/19/2018 12:36   Mr Total Spine Mets  Screening  Result Date: 03/21/2018 CLINICAL DATA:  Initial evaluation for metastatic disease, history of breast cancer, staging. EXAM: MRI TOTAL SPINE WITHOUT AND WITH CONTRAST TECHNIQUE: Multisequence MR imaging of the spine from the cervical spine to the sacrum was performed prior to and following IV contrast administration for evaluation of spinal metastatic disease. CONTRAST:  24mL MULTIHANCE GADOBENATE DIMEGLUMINE 529 MG/ML IV SOLN COMPARISON:  Prior CT from 03/19/2018 as well as PET-CT from 02/03/2018 and MRI from 12/07/2017 FINDINGS: MRI CERVICAL SPINE FINDINGS Alignment: Straightening with slight reversal of the normal cervical lordosis. Trace anterolisthesis of C4 on C5, likely chronic. Vertebrae: Diffuse sclerotic metastatic disease seen throughout the cervical spine with probable areas of red marrow reconversion. Involvement of essentially all levels. Vertebral body heights maintained without pathologic fracture. No extra osseous tumor. Cord: Signal intensity within the cervical spinal cord is normal. No intrathecal lesions or abnormal enhancement. Posterior Fossa, vertebral arteries, paraspinal tissues: Visualized brain and posterior  fossa within normal limits. Craniocervical junction normal. Paraspinous and prevertebral soft tissues normal. Disc levels: Degenerative disc bulging present at C5-6 through C7-T1 without significant spinal stenosis. Mild multilevel facet hypertrophy within the cervical spine. Foramina are grossly patent. MRI THORACIC SPINE FINDINGS Alignment: Mild straightening of the normal thoracic kyphosis. No listhesis or malalignment. Vertebrae: Diffuse osseous sclerotic metastases seen throughout the thoracic spine with areas of red marrow reconversion. Involvement of the centrally all levels. Stable height loss at the superior endplates of K59, D35, and T12. Vertebral body heights otherwise maintained no new pathologic fracture. No extra osseous extension of tumor. Cord: Signal intensity within the thoracic spinal cord is normal. No intrathecal lesions or abnormal enhancement. Paraspinal and other soft tissues: Paraspinous soft tissues demonstrate no acute finding. Disc levels: Minimal disc bulging noted within the upper thoracic spine. No significant stenosis. MRI LUMBAR SPINE FINDINGS Segmentation: Normal segmentation. Lowest well-formed disc labeled the L5-S1 level. Alignment: Normal alignment with preservation of the normal lumbar lordosis. No listhesis. Vertebrae: Diffuse sclerotic osseous metastases throughout the lumbar spine, essentially involving all levels. Involvement of the visualize sacrum and pelvis. Height loss at the superior endplate of L1 is chronic in appearance, and stable from previous. Schmorl's node deformity at the superior endplate of L2 and inferior endplate of L3 also chronic in appearance. No acute pathologic fracture. No extra osseous tumor. Conus medullaris: Extends to the L1-2 level and appears normal. No intrathecal tumor or abnormal enhancement. Paraspinal and other soft tissues: Paraspinous soft tissues demonstrate no acute finding. Disc levels: Minor disc bulging at L1-2 through L4-5 without  significant stenosis. Foramina appear patent. IMPRESSION: 1. Diffuse sclerotic osseous metastases involving essentially all levels throughout the cervical, thoracic, and lumbar spine as well as the visible sacrum and pelvis. No extra osseous or epidural tumor. 2. Chronic compression deformities involving the T10 through L1 vertebral bodies, stable from previous. No acute pathologic fracture identified. Electronically Signed   By: Jeannine Boga M.D.   On: 03/21/2018 21:52      Subjective:   Discharge Exam: Vitals:   03/22/18 0600 03/22/18 1241  BP: 121/64 128/62  Pulse: 69 67  Resp: 20 18  Temp: 99.2 F (37.3 C) 98 F (36.7 C)  SpO2: 99% 97%   Vitals:   03/21/18 1332 03/21/18 1955 03/22/18 0600 03/22/18 1241  BP: (!) 148/64 (!) 151/76 121/64 128/62  Pulse: (!) 59 67 69 67  Resp: 16 18 20 18   Temp: 97.9 F (36.6 C) 99 F (37.2 C) 99.2 F (37.3 C) 98 F (36.7  C)  TempSrc: Oral Oral Oral Oral  SpO2: 99% 98% 99% 97%  Weight:      Height:         General exam: Appears calm and comfortable  Respiratory system: Clear to auscultation. Respiratory effort normal. Cardiovascular system: Regular rate and rhythm, no murmurs Gastrointestinal system: Abdomen is nondistended, soft and nontender. No organomegaly or masses felt. Normal bowel sounds heard. Central nervous system: Alert and oriented.  Dysdiadochokinesia, dysmetria, Bilateral lower extremity strength is 5 out of 5. Extremities: No lower extremity edema Skin: No rashes Psychiatry: Judgement and insight appear normal. Mood & affect appropriate.         The results of significant diagnostics from this hospitalization (including imaging, microbiology, ancillary and laboratory) are listed below for reference.     Microbiology: No results found for this or any previous visit (from the past 240 hour(s)).   Labs: BNP (last 3 results) No results for input(s): BNP in the last 8760 hours. Basic Metabolic  Panel: Recent Labs  Lab 03/19/18 0754  NA 139  K 3.9  CL 103  CO2 26  GLUCOSE 115*  BUN 9  CREATININE 0.69  CALCIUM 8.8*   Liver Function Tests: Recent Labs  Lab 03/19/18 0754  AST 25  ALT 19  ALKPHOS 70  BILITOT 0.4  PROT 6.9  ALBUMIN 3.7   No results for input(s): LIPASE, AMYLASE in the last 168 hours. No results for input(s): AMMONIA in the last 168 hours. CBC: Recent Labs  Lab 03/19/18 0754  WBC 4.4  NEUTROABS 3.0  HGB 12.7  HCT 36.8  MCV 98.6  PLT 216   Cardiac Enzymes: No results for input(s): CKTOTAL, CKMB, CKMBINDEX, TROPONINI in the last 168 hours. BNP: Invalid input(s): POCBNP CBG: No results for input(s): GLUCAP in the last 168 hours. D-Dimer No results for input(s): DDIMER in the last 72 hours. Hgb A1c No results for input(s): HGBA1C in the last 72 hours. Lipid Profile No results for input(s): CHOL, HDL, LDLCALC, TRIG, CHOLHDL, LDLDIRECT in the last 72 hours. Thyroid function studies No results for input(s): TSH, T4TOTAL, T3FREE, THYROIDAB in the last 72 hours.  Invalid input(s): FREET3 Anemia work up No results for input(s): VITAMINB12, FOLATE, FERRITIN, TIBC, IRON, RETICCTPCT in the last 72 hours. Urinalysis    Component Value Date/Time   COLORURINE YELLOW 11/12/2015 0837   APPEARANCEUR CLEAR 11/12/2015 0837   LABSPEC 1.005 03/18/2017 1223   PHURINE 6.0 03/18/2017 1223   PHURINE 6.5 11/12/2015 0837   GLUCOSEU Negative 03/18/2017 1223   HGBUR Large 03/18/2017 1223   HGBUR MODERATE (A) 11/12/2015 0837   BILIRUBINUR Negative 03/18/2017 1223   KETONESUR Negative 03/18/2017 1223   KETONESUR NEGATIVE 11/12/2015 0837   PROTEINUR Negative 03/18/2017 1223   PROTEINUR NEGATIVE 11/12/2015 0837   UROBILINOGEN 0.2 03/18/2017 1223   NITRITE Negative 03/18/2017 1223   NITRITE NEGATIVE 11/12/2015 0837   LEUKOCYTESUR Moderate 03/18/2017 1223   Sepsis Labs Invalid input(s): PROCALCITONIN,  WBC,  LACTICIDVEN Microbiology No results found for  this or any previous visit (from the past 240 hour(s)).   Time coordinating discharge: Over 30 minutes  SIGNED:   Cristy Folks, MD  [facility] 03/22/2018, 1:43 PM  If 7PM-7AM, please contact night-coverage [email or web address] Password [email or web address]

## 2018-03-24 ENCOUNTER — Telehealth: Payer: Self-pay | Admitting: Radiation Oncology

## 2018-03-24 ENCOUNTER — Other Ambulatory Visit: Payer: Self-pay | Admitting: Oncology

## 2018-03-24 ENCOUNTER — Encounter: Payer: Self-pay | Admitting: Radiation Oncology

## 2018-03-24 DIAGNOSIS — C50412 Malignant neoplasm of upper-outer quadrant of left female breast: Secondary | ICD-10-CM

## 2018-03-24 DIAGNOSIS — Z170 Estrogen receptor positive status [ER+]: Principal | ICD-10-CM

## 2018-03-24 NOTE — Telephone Encounter (Signed)
Today I spoke with Alison [name] and gave her the results to the MRI of her spine.  This shows osseous disease diffusely but no disease along the spinal cord or in the spinal canal/epidural space.  She was pleased by this news.  She looks forward to meeting Alison [name] on Wednesday.  She is interested in talking more with him about intrathecal chemotherapy but she understands that the alternative would be whole brain radiotherapy.  If she pursues intrathecal chemotherapy upfront, whole brain radiotherapy could be held as a Plan B.  She understands that I will be on vacation starting on July 31 but if whole brain radiotherapy is needed urgently Alison [name] RT can facilitate this with one of my partners  She does report some continued dizziness and knows to continue the steroid therapy.  I told her that the symptoms may continue until she has a satisfactory response to chemotherapy or radiotherapy  -----------------------------------  Alison Gibson, MD

## 2018-03-25 ENCOUNTER — Other Ambulatory Visit: Payer: Self-pay | Admitting: *Deleted

## 2018-03-25 DIAGNOSIS — Z170 Estrogen receptor positive status [ER+]: Principal | ICD-10-CM

## 2018-03-25 DIAGNOSIS — C50412 Malignant neoplasm of upper-outer quadrant of left female breast: Secondary | ICD-10-CM

## 2018-03-25 MED ORDER — ZOLPIDEM TARTRATE 5 MG PO TABS: ORAL_TABLET | ORAL | 0 refills | Status: DC

## 2018-03-26 ENCOUNTER — Inpatient Hospital Stay (HOSPITAL_BASED_OUTPATIENT_CLINIC_OR_DEPARTMENT_OTHER): Payer: 59 | Admitting: Internal Medicine

## 2018-03-26 ENCOUNTER — Other Ambulatory Visit: Payer: Self-pay | Admitting: Adult Health

## 2018-03-26 ENCOUNTER — Encounter: Payer: Self-pay | Admitting: Internal Medicine

## 2018-03-26 ENCOUNTER — Other Ambulatory Visit: Payer: Self-pay

## 2018-03-26 VITALS — BP 152/91 | HR 81 | Temp 99.1°F | Resp 18 | Wt 151.6 lb

## 2018-03-26 DIAGNOSIS — Z9221 Personal history of antineoplastic chemotherapy: Secondary | ICD-10-CM

## 2018-03-26 DIAGNOSIS — Z79899 Other long term (current) drug therapy: Secondary | ICD-10-CM

## 2018-03-26 DIAGNOSIS — Z801 Family history of malignant neoplasm of trachea, bronchus and lung: Secondary | ICD-10-CM | POA: Diagnosis not present

## 2018-03-26 DIAGNOSIS — Z87891 Personal history of nicotine dependence: Secondary | ICD-10-CM | POA: Diagnosis not present

## 2018-03-26 DIAGNOSIS — Z923 Personal history of irradiation: Secondary | ICD-10-CM | POA: Diagnosis not present

## 2018-03-26 DIAGNOSIS — M8580 Other specified disorders of bone density and structure, unspecified site: Secondary | ICD-10-CM

## 2018-03-26 DIAGNOSIS — G620 Drug-induced polyneuropathy: Secondary | ICD-10-CM

## 2018-03-26 DIAGNOSIS — R51 Headache: Secondary | ICD-10-CM | POA: Diagnosis not present

## 2018-03-26 DIAGNOSIS — Z79811 Long term (current) use of aromatase inhibitors: Secondary | ICD-10-CM | POA: Diagnosis not present

## 2018-03-26 DIAGNOSIS — R42 Dizziness and giddiness: Secondary | ICD-10-CM | POA: Diagnosis not present

## 2018-03-26 DIAGNOSIS — C773 Secondary and unspecified malignant neoplasm of axilla and upper limb lymph nodes: Secondary | ICD-10-CM

## 2018-03-26 DIAGNOSIS — C7931 Secondary malignant neoplasm of brain: Secondary | ICD-10-CM | POA: Diagnosis not present

## 2018-03-26 DIAGNOSIS — C7951 Secondary malignant neoplasm of bone: Secondary | ICD-10-CM | POA: Diagnosis present

## 2018-03-26 DIAGNOSIS — Z85828 Personal history of other malignant neoplasm of skin: Secondary | ICD-10-CM | POA: Diagnosis not present

## 2018-03-26 DIAGNOSIS — G893 Neoplasm related pain (acute) (chronic): Secondary | ICD-10-CM | POA: Diagnosis not present

## 2018-03-26 DIAGNOSIS — C50412 Malignant neoplasm of upper-outer quadrant of left female breast: Secondary | ICD-10-CM | POA: Diagnosis present

## 2018-03-26 DIAGNOSIS — Z170 Estrogen receptor positive status [ER+]: Secondary | ICD-10-CM | POA: Diagnosis not present

## 2018-03-26 DIAGNOSIS — T451X5A Adverse effect of antineoplastic and immunosuppressive drugs, initial encounter: Secondary | ICD-10-CM

## 2018-03-26 DIAGNOSIS — F419 Anxiety disorder, unspecified: Secondary | ICD-10-CM

## 2018-03-26 NOTE — Progress Notes (Signed)
[facility] at [place], [place] [address] [phone]   New Patient Evaluation  Date of Service: 03/26/18 Patient Name: Alison [name] Patient MRN: [id number] Patient DOB: 09/27/1960 Provider: Ventura Sellers, MD  Identifying Statement:  AMIREE NO is a 57 y.o. female with Brain metastasis ([place]) [C79.31] who presents for initial consultation and evaluation regarding cancer associated neurologic deficits.    Referring Provider: [name], MD [address] [place], [place] [address]  Oncologic History:   Malignant neoplasm of upper-outer quadrant of left breast in female, estrogen receptor positive ([place])   08/04/2015 Initial Biopsy    (L) breast biopsy (2:00): IDC, grade 3, high grade DCIS with calcs and necrosis.  (L) breast biopsy (1:00): IDC, grade 3, high grade DCIS with calcs and necrosis. ER+ (95%), PR+ (30%), Ki67 12%, HER2 neg (ratio 1.22).       08/08/2015 Initial Diagnosis    Breast cancer of upper-outer quadrant of left female breast ([place])      08/09/2015 Procedure    (L) axilla needle biopsy: Metastatic carcinoma with ECE.       08/12/2015 Imaging    Bone scan: No definite evidence of osseous metastases.      08/12/2015 Imaging    CT chest: No findings to suggest metastatic disease in thorax. Asymmetric skin thickening in left breast.      08/25/2015 - 10/06/2015 Neo-Adjuvant Chemotherapy    Adriamycin/Cytoxan x 4 cycles.       10/20/2015 - 12/23/2015 Neo-Adjuvant Chemotherapy    Taxol x 10 cycles (last 2 cycles d/c'd due to side effects).       02/02/2016 Surgery    (L) mastectomy with SLNB and regional LND ([place]): IDC, grade 2, spanning 10 cm; LCIS;  1/1 SLN positive for metastatic carcinoma. 3/3 LNs positive for micrometastatic disease. Negative margins.  ER+ (60%), PR-, HER2 neg (ratio 1.00).  ypT3, ypN2a: Stage IIIA       02/14/2016 Surgery    Left axillary regional lymph node dissection  ([place]): 2/8 LNs positive for metastatic carcinoma (1 with micromets & 1 with macromets).  Pathologic stage remains unchanged (ypN2a).       04/02/2016 Imaging    Bone scan: No definite scintigraphic evidence of osseous metastatic disease      04/02/2016 Imaging    CT chest: No evidence of metastatic disease within the chest.      04/02/2016 - 05/21/2016 Radiation Therapy    Adjuvant radiation therapy [facility]): Left Chest Wall, SCV, Axilla, IM nodes / 50.4Gy in 28 fractions (IM nodes receiving 45Gy+).  Left Chest wall scar boost / 10 Gy in 5 fractions.       Concurrent Chemotherapy    Concurrent Xeloda with radiation therapy; completed on 05/21/16      05/2016 -  Anti-estrogen oral therapy    Anastrozole 1 mg daily. Planned duration of treatment: 5-10 years.  Also on PALLAS trial; randomized to Ibrance arm.        History of Present Illness: The patient's records from the referring physician were obtained and reviewed and the patient interviewed to confirm this HPI.  Alison [name] presented to neurologic attention 2-3 weeks ago after noticing new onset of daily headaches.  She describes them as occurring every morning, holocranial, improving throughout the day but worsening with coughing or straining for bowel movement.  Headaches later were accompanied by daily bouts of vertigo with discrete onset, lasting several minutes.  These  episodes have progressed and are now occurring several times per day.  In addition, she has complained of double vision "in all directions".  Her gait has been impaired as well, partly because of vertigo, but also because of general imbalance.  She is able to walk on her own slowly, but has been requiring a wheelchair more and more.  She did end up with [place] the [place] which led to a "normal" CT head.  Persistence of symptoms prompted Dr. 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   She is currently on aromatase inhibitor oral therapy for stage IV breast cancer.  She does have chronic back pain from bony mets, but denies new back pain or urinary urgency/incontinence.  Medications: Current Outpatient Medications on File Prior to Visit  Medication Sig Dispense Refill  . amLODipine (NORVASC) 5 MG tablet Take 5 mg by mouth daily.    . Ascorbic Acid (VITAMIN C) 1000 MG tablet Take 1,000 mg by mouth 2 (two) times daily.    . carisoprodol (SOMA) 350 MG tablet Take 350 mg by mouth 3 (three) times daily.    . cetirizine (ZYRTEC) 10 MG tablet Take 10 mg by mouth daily.    . Cholecalciferol (VITAMIN D3) 10000 units capsule Take 10,000 Units by mouth daily.     Marland Kitchen dexamethasone (DECADRON) 4 MG tablet Take 2 tablets (8 mg total) by mouth 2 (two) times daily. 120 tablet 0  . escitalopram (LEXAPRO) 20 MG tablet Take 1 tablet (20 mg total) by mouth daily. 90 tablet 4  . everolimus (AFINITOR) 10 MG tablet Take 1 tablet (10 mg total) by mouth daily. 30 tablet 6  . exemestane (AROMASIN) 25 MG tablet Take 1 tablet (25 mg total) by mouth daily after breakfast. 90 tablet 4  . fluticasone (FLONASE) 50 MCG/ACT nasal spray Place 2 sprays into both nostrils daily. Reported on 11/18/2015    . gabapentin (NEURONTIN) 300 MG capsule Take 4 capsules (1,200 mg total) by mouth at bedtime. TAKE 2 CAPSULES IN THE MORNING AND TAKE 2 CAPSULES MID-DAY AND TAKE 4CAPSULES AT BEDTIME 240 capsule 4  . HYDROmorphone (DILAUDID) 8 MG tablet Take 1 tablet (8 mg total) by mouth every 4 (four) hours as needed for moderate pain (21m for moderate 3-6/10, 834mfor >6/10 pain). 200 tablet 0  . LORazepam (ATIVAN) 0.5 MG tablet Take 1 tablet (0.5 mg total) by mouth at bedtime. 90 tablet 3  . meclizine (ANTIVERT) 25 MG tablet Take 1 tablet (25 mg total) by mouth 3 (three) times daily as needed for dizziness. 30 tablet 0  . Misc. Devices (GETGO ROLLING WALKER) MISC 1 Units by Does not apply route continuous as needed. 1 each 0  .  Multiple Vitamin (MULTIVITAMIN) tablet Take 1 tablet by mouth daily.    . ondansetron (ZOFRAN) 8 MG tablet Take 1 tablet (8 mg total) by mouth every 8 (eight) hours as needed for nausea or vomiting. 20 tablet 5  . oxyCODONE (OXYCONTIN) 60 MG 12 hr tablet Take 180 mg by mouth every 8 (eight) hours. 270 tablet 0  . Probiotic Product (PROBIOTIC DAILY PO) Take 1 capsule by mouth daily. RePardeevillerochlorperazine (COMPAZINE) 10 MG tablet Take 1 tablet (10 mg total) by mouth every 6 (six) hours as needed (Nausea or vomiting). 90 tablet 2  . traZODone (DESYREL) 50 MG tablet TAKE 3 TABLETS AT  BEDTIME 90 tablet 2  . zolpidem (AMBIEN) 5 MG tablet TAKE ONE TABLET AT BEDTIME AS NEEDED FOR SLEEP 30 tablet 0   No current facility-administered medications on file prior to visit.     Allergies:  Allergies  Allergen Reactions  . Sulfa Antibiotics Other (See Comments)    Blisters in mouth   Past Medical History:  Past Medical History:  Diagnosis Date  . Anxiety   . Asthma     seasonal asthma  . Breast cancer ([place])    metastatic breast cancer  . Complication of anesthesia 2007   aspiration pneumonia post hysterectomy.  Blood pressure would drop low- but no problems 01/2016- surgery  . History of blood transfusion   . History of mitral valve prolapse 1988   with palpitations  . History of radiation therapy 04/02/16- 05/21/16   Left Chest Wall, SCV, Axilla, IM nodes  . Hypertension    no meds  . Neuropathy    with chemo  . Osteopenia determined by x-ray   . Pneumonia    1980's and 1990's  . Raynaud's disease   . 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 Skin cancer 03/12/2014   basal- back   Past Surgical History:  Past Surgical History:  Procedure Laterality Date  . ABDOMINAL HYSTERECTOMY  2007   with BSO  . APPENDECTOMY  2007  . AXILLARY LYMPH NODE DISSECTION Left 02/14/2016   Procedure: LEFT AXILLARY LYMPH NODE  DISSECTION;  Surgeon: [name], MD;  Location: [facility];  Service: General;  Laterality: Left;  . BREAST RECONSTRUCTION WITH PLACEMENT OF TISSUE EXPANDER AND FLEX HD (ACELLULAR HYDRATED DERMIS) Left 02/02/2016   Procedure: IMMEDIATE LEFT BREAST RECONSTRUCTION WITH PLACEMENT OF TISSUE EXPANDER AND FLEX HD (ACELLULAR HYDRATED DERMIS);  Surgeon: [name], DO;  Location: [facility];  Service: Plastics;  Laterality: Left;  . BREAST REDUCTION SURGERY Right 03/28/2017   Procedure: RIGHT MAMMARY REDUCTION  (BREAST);  Surgeon: [name], DO;  Location: [facility];  Service: Plastics;  Laterality: Right;  . BREAST SURGERY Left 02/02/16   mastectomy with reconstruction  . c-section  1991  . COLON RESECTION SIGMOID  12/02/2012   [facility]   . HERNIA REPAIR  2007  . LAPAROSCOPIC ENDOMETRIOSIS FULGURATION  1984, 1989  . MASTOPEXY Right 03/28/2017   Procedure: MASTOPEXY;  Surgeon: [name], DO;  Location: [facility];  Service: Plastics;  Laterality: Right;  . Port- a- cath placement  07/2015  . PORT-A-CATH REMOVAL Right 02/02/2016   Procedure: REMOVAL PORT-A-CATH;  Surgeon: [name], MD;  Location: [facility];  Service: General;  Laterality: Right;  . RADIOACTIVE SEED GUIDED AXILLARY SENTINEL LYMPH NODE Left 02/02/2016   Procedure: LEFT BREAST MASTECTOMY AND RADIOACTIVE SEED GUIDED LEFT SENTINEL LYMPH NODE BIOPSY ;  Surgeon: [name], MD;  Location: [facility];  Service: General;  Laterality: Left;  . REMOVAL OF TISSUE EXPANDER AND PLACEMENT OF IMPLANT Left 03/28/2017   Procedure: REMOVAL OF TISSUE EXPANDER AND PLACEMENT OF IMPLANT;  Surgeon: [name], DO;  Location: [facility];  Service: Plastics;  Laterality: Left;   Social History:  Social History   Socioeconomic History  . Marital status: Married    Spouse name: Not on file  . Number of children: Not on file  . Years of  education: Not on file  . Highest education level: Not on file  Occupational History  . Not  on file  Social Needs  . Financial resource strain: Not on file  . Food insecurity:    Worry: Not on file    Inability: Not on file  . Transportation needs:    Medical: Not on file    Non-medical: Not on file  Tobacco Use  . Smoking status: Former Smoker    Packs/day: 1.00    Years: 10.00    Pack years: 10.00    Types: Cigarettes    Last attempt to quit: 11/25/2008    Years since quitting: 9.3  . Smokeless tobacco: Never Used  . Tobacco comment: on and off smoker never consistent  Substance and Sexual Activity  . Alcohol use: Yes    Comment: social  . Drug use: No  . Sexual activity: Not on file  Lifestyle  . Physical activity:    Days per week: Not on file    Minutes per session: Not on file  . Stress: Not on file  Relationships  . Social connections:    Talks on phone: Not on file    Gets together: Not on file    Attends religious service: Not on file    Active member of club or organization: Not on file    Attends meetings of clubs or organizations: Not on file    Relationship status: Not on file  . Intimate partner violence:    Fear of current or ex partner: Not on file    Emotionally abused: Not on file    Physically abused: Not on file    Forced sexual activity: Not on file  Other Topics Concern  . Not on file  Social History Narrative  . Not on file   Family History:  Family History  Problem Relation Age of Onset  . Lung cancer Father     Review of Systems: Constitutional: Denies fevers, chills or abnormal weight loss Eyes: Denies blurriness of vision Ears, nose, mouth, throat, and face: Denies mucositis or sore throat Respiratory: Denies cough, dyspnea or wheezes Cardiovascular: Denies palpitation, chest discomfort or lower extremity swelling Gastrointestinal:  Denies nausea, constipation, diarrhea GU: Denies dysuria or incontinence Skin: Denies abnormal  skin rashes Neurological: Per HPI Musculoskeletal: Back pain (chronic) Behavioral/Psych: +anxiety   Physical Exam: Vitals:   03/26/18 1135  BP: (!) 152/91  Pulse: 81  Resp: 18  Temp: 99.1 F (37.3 C)  SpO2: 97%   KPS: 60. General: Alert, cooperative, pleasant, in no acute distress Head: Normal EENT: No conjunctival injection or scleral icterus. Oral mucosa moist Lungs: Resp effort normal Cardiac: Regular rate and rhythm Abdomen: Soft, non-distended abdomen Skin: No rashes cyanosis or petechiae. Extremities: No clubbing or edema  Neurologic Exam: Mental Status: Awake, alert, attentive to examiner. Oriented to self and environment. Language is fluent with intact comprehension.  Cranial Nerves: Visual acuity is grossly normal. Visual fields are full. Extra-ocular movements intact with bilateral coarse horizontal nystagmus. Diplopia horizontal in all directions. No ptosis. Face is symmetric, tongue midline. Motor: Tone and bulk are normal. Power is full in both arms and legs. Reflexes are symmetric, no pathologic reflexes present. Mild dysmetria on bilateral finger to nose, symmetric. Sensory: Intact to light touch and temperature Gait: Independent but wide based and with truncal swaying   Labs: I have reviewed the data as listed    Component Value Date/Time   NA 139 03/19/2018 0754   NA 138 08/14/2017 1042   K 3.9 03/19/2018 0754   K 4.2 08/14/2017 1042   CL 103 03/19/2018  0754   CO2 26 03/19/2018 0754   CO2 26 08/14/2017 1042   GLUCOSE 115 (H) 03/19/2018 0754   GLUCOSE 129 08/14/2017 1042   BUN 9 03/19/2018 0754   BUN 8.3 08/14/2017 1042   CREATININE 0.69 03/19/2018 0754   CREATININE 0.74 01/15/2018 1409   CREATININE 0.8 08/14/2017 1042   CALCIUM 8.8 (L) 03/19/2018 0754   CALCIUM 9.6 08/14/2017 1042   PROT 6.9 03/19/2018 0754   PROT 6.5 08/14/2017 1042   ALBUMIN 3.7 03/19/2018 0754   ALBUMIN 3.4 (L) 08/14/2017 1042   AST 25 03/19/2018 0754   AST 35 (H)  01/15/2018 1409   AST 63 (H) 08/14/2017 1042   ALT 19 03/19/2018 0754   ALT 26 01/15/2018 1409   ALT 53 08/14/2017 1042   ALKPHOS 70 03/19/2018 0754   ALKPHOS 84 08/14/2017 1042   BILITOT 0.4 03/19/2018 0754   BILITOT 0.4 01/15/2018 1409   BILITOT 0.44 08/14/2017 1042   GFRNONAA >60 03/19/2018 0754   GFRNONAA >60 01/15/2018 1409   GFRAA >60 03/19/2018 0754   GFRAA >60 01/15/2018 1409   Lab Results  Component Value Date   WBC 4.4 03/19/2018   NEUTROABS 3.0 03/19/2018   HGB 12.7 03/19/2018   HCT 36.8 03/19/2018   MCV 98.6 03/19/2018   PLT 216 03/19/2018    Imaging:  Ct Cervical Spine Wo Contrast  Result Date: 03/19/2018 CLINICAL DATA:  Neck pain after a fall today. Near syncopal episode. Headache. Vertigo for 2 weeks. History of metastatic breast cancer. EXAM: CT CERVICAL SPINE WITHOUT CONTRAST TECHNIQUE: Multidetector CT imaging of the cervical spine was performed without intravenous contrast. Multiplanar CT image reconstructions were also generated. COMPARISON:  PET-CT 02/03/2018 and cervical spine CT 02/26/2017 FINDINGS: Alignment: Chronic mild reversal of the normal cervical lordosis. Unchanged slight anterolisthesis of C4 on C5 and C5 on C6. Skull base and vertebrae: Widespread, predominantly sclerotic osseous metastases have progressed from the 2018 cervical spine CT but are similar to the more recent PET-CT. No acute fracture is identified. Soft tissues and spinal canal: No gross canal hematoma or epidural tumor on this unenhanced study. No prevertebral fluid. Disc levels: Similar degenerative changes compared to the 2018 study. Mild disc space narrowing and spurring at C5-6 and C6-7. Multilevel facet arthrosis, most severe on the left at C4-5 resulting in mild left neural foraminal stenosis. No high-grade osseous neural foraminal or spinal canal stenosis identified. Upper chest: Clear lung apices. Other: Mild right and moderate left carotid bifurcation calcified atherosclerosis.  IMPRESSION: 1. No acute osseous abnormality identified in the cervical spine. 2. Known widespread osseous metastatic disease. Electronically Signed   By: Logan Bores M.D.   On: 03/19/2018 11:17   Mr Jeri Cos And Wo Contrast  Result Date: 03/19/2018 CLINICAL DATA:  Recent near syncopal episode. Possible cerebellar lesion on outside head CT. Vertigo. Fall today. History of breast cancer with osseous metastases. EXAM: MRI HEAD WITHOUT AND WITH CONTRAST TECHNIQUE: Multiplanar, multiecho pulse sequences of the brain and surrounding structures were obtained without and with intravenous contrast. CONTRAST:  30m MULTIHANCE GADOBENATE DIMEGLUMINE 529 MG/ML IV SOLN COMPARISON:  [facility] FINDINGS: Some sequences are mildly motion degraded. Brain: There is edema throughout the superior and medial cerebellar hemispheres bilaterally with involvement of the vermis, posterior pons, and tectum. 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 There is also leptomeningeal enhancement in the left occipital lobe and interpeduncular cistern. Masslike extra-axial enhancement  in the pineal region measures 2.1 x 1.2 cm. There is likely a small amount of leptomeningeal enhancement along the ventral pons and medulla, and there is abnormal enhancement along the left seventh and eighth cranial nerve complex in the internal auditory canal. There is also abnormal enhancement involving Meckel's cave on the left and likely involving the cisternal segment of the left trigeminal. T2 shine through is noted in the cerebellum on diffusion imaging. No definite acute infarct is identified. There is no evidence of intracranial hemorrhage, midline shift, or extra-axial fluid collection. Posterior fossa edema partially effaces the fourth ventricle. The lateral and third ventricles are normal in size without evidence of obstructive  hydrocephalus. A few small foci of cerebral white matter T2 hyperintensity are nonspecific. Vascular: Major intracranial vascular flow voids are preserved. Skull and upper cervical spine: Known widespread osseous metastatic disease involving the skull and included upper cervical spine. Extensive skull base involvement including of the clivus. Sinuses/Orbits: Unremarkable orbits. Clear paranasal sinuses. Trace left mastoid effusion. Other: None. IMPRESSION: 1. 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 Cerebellar edema with partial fourth ventricle effacement. No evidence of obstructive hydrocephalus at this time. 2. Extensive osseous metastases. Electronically Signed   By: Logan Bores M.D.   On: 03/19/2018 12:36   Mr Total Spine Mets Screening  Result Date: 03/21/2018 CLINICAL DATA:  Initial evaluation for metastatic disease, history of breast cancer, staging. EXAM: MRI TOTAL SPINE WITHOUT AND WITH CONTRAST TECHNIQUE: Multisequence MR imaging of the spine from the cervical spine to the sacrum was performed prior to and following IV contrast administration for evaluation of spinal metastatic disease. CONTRAST:  75m MULTIHANCE GADOBENATE DIMEGLUMINE 529 MG/ML IV SOLN COMPARISON:  Prior CT from 03/19/2018 as well as PET-CT from 02/03/2018 and MRI from 12/07/2017 FINDINGS: MRI CERVICAL SPINE FINDINGS Alignment: Straightening with slight reversal of the normal cervical lordosis. Trace anterolisthesis of C4 on C5, likely chronic. Vertebrae: Diffuse sclerotic metastatic disease seen throughout the cervical spine with probable areas of red marrow reconversion. Involvement of essentially all levels. Vertebral body heights maintained without pathologic fracture. No extra osseous tumor. Cord: Signal intensity within the cervical spinal cord is normal. No intrathecal lesions or abnormal enhancement. Posterior Fossa, vertebral  arteries, paraspinal tissues: Visualized brain and posterior fossa within normal limits. Craniocervical junction normal. Paraspinous and prevertebral soft tissues normal. Disc levels: Degenerative disc bulging present at C5-6 through C7-T1 without significant spinal stenosis. Mild multilevel facet hypertrophy within the cervical spine. Foramina are grossly patent. MRI THORACIC SPINE FINDINGS Alignment: Mild straightening of the normal thoracic kyphosis. No listhesis or malalignment. Vertebrae: Diffuse osseous sclerotic metastases seen throughout the thoracic spine with areas of red marrow reconversion. Involvement of the centrally all levels. Stable height loss at the superior endplates of TH70 TY63 and T12. Vertebral body heights otherwise maintained no new pathologic fracture. No extra osseous extension of tumor. Cord: Signal intensity within the thoracic spinal cord is normal. No intrathecal lesions or abnormal enhancement. Paraspinal and other soft tissues: Paraspinous soft tissues demonstrate no acute finding. Disc levels: Minimal disc bulging noted within the upper thoracic spine. No significant stenosis. MRI LUMBAR SPINE FINDINGS Segmentation: Normal segmentation. Lowest well-formed disc labeled the L5-S1 level. Alignment: Normal alignment with preservation of the normal lumbar lordosis. No listhesis. Vertebrae: Diffuse sclerotic osseous metastases throughout the lumbar spine, essentially involving all levels. Involvement of the visualize sacrum and pelvis. Height loss at the superior endplate of L1 is chronic in appearance,  and stable from previous. Schmorl's node deformity at the superior endplate of L2 and inferior endplate of L3 also chronic in appearance. No acute pathologic fracture. No extra osseous tumor. Conus medullaris: Extends to the L1-2 level and appears normal. No intrathecal tumor or abnormal enhancement. Paraspinal and other soft tissues: Paraspinous soft tissues demonstrate no acute finding.  Disc levels: Minor disc bulging at L1-2 through L4-5 without significant stenosis. Foramina appear patent. IMPRESSION: 1. Diffuse sclerotic osseous metastases involving essentially all levels throughout the cervical, thoracic, and lumbar spine as well as the visible sacrum and pelvis. No extra osseous or epidural tumor. 2. Chronic compression deformities involving the T10 through L1 vertebral bodies, stable from previous. No acute pathologic fracture identified. Electronically Signed   By: Jeannine Boga M.D.   On: 03/21/2018 21:52     Assessment/Plan 1. Brain metastasis ([place] [place])  Alison [name] demonstrates metastatic spread of breast cancer within the posterior fossa compartment of the CNS in a leptomeningeal pattern.  There is no supratentorial or spinal disease visible on imaging done to this point.    The case was presented at brain and spine tumor board this morning, and discussed with radiation oncology.  We communicated our recommendation, which was for fractionated radiotherapy to the posterior fossa, with deferment of intrathecal chemotherapy.  We discussed the poor prognosis of widespread leptomeningeal dissemination, as well as the high toxicity and ultimately limited efficacy of agents like IT-MTX.  Limiting dose exposure to the involved compartment (posterior fossa) would have potential to provide local control and relieve symptoms without excessive toxicity.  Alison [name] will consider our recommendation as she continues to seek out additional opinion at [facility].  We expressed strong support for tertiary center input and would be happy to implement recommendations made therein.      For vertigo, recommended Meclizine 66m PRN or standing BID if continues to worsen.  Headaches can be managed with current analgesia regimen, and would likely be improved by radiation if disease is responsive.  We spent twenty additional minutes teaching regarding the natural history, biology, and  historical experience in the treatment of neurologic complications of cancer. We also provided teaching sheets for the patient to take home as an additional resource.  We appreciate the opportunity to participate in the care of LKearney [name]   All questions were answered. The patient knows to call the clinic with any problems, questions or concerns. No barriers to learning were detected.  The total time spent in the encounter was 40 minutes and more than 50% was on counseling and review of test results   ZVentura Sellers MD Medical Director of Neuro-Oncology [facility]at [facility]07/31/19 11:02 AM

## 2018-03-27 ENCOUNTER — Telehealth: Payer: Self-pay

## 2018-03-27 NOTE — Telephone Encounter (Signed)
Per 7/31 no los 

## 2018-03-30 ENCOUNTER — Other Ambulatory Visit: Payer: Self-pay | Admitting: Oncology

## 2018-03-30 NOTE — Progress Notes (Unsigned)
  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 Prior systemic therapies have included AC/Taxol, and anastrozole/palbociclib followed by [place]/XRT in the adjuvant setting. In the metastatic setting, she has been treated with anastrozole, fulvestrant and abemaciclib, transitioned to exemestane approx 1 month ago. Plan was to start everolimus, but has not been initiated to date.   Given her new diagnosis of symptomatic leptomeningeal disease, she will continue on steroids at dexamethasone 73m po BID. We asked her to add prilosec. She will then initiate WBRT under the direction of Dr. JMartiniqueTorok in the very near future. The plan will be for 3 weeks of therapy. Toward the end of therapy, we will restage her systemic disease with a CT c/a/p and Bone scan.   During radiation and to guide systemic therapy, we will arrange for a lumbar puncture to assess her CSF cytology. If positive, we will likely recommend IT methotrexate as Depocyt is no longer available in the [place] We indicated 3 LP's would be required to definitively say that her CSF was negative given our high pre-test probability. Finally, we reviewed the clinical trials landscape in [place] which is largely comprised of IO studies. Interestingly, many of the studies either exclude patients who have received WBRT, or require a 3 month time interval to start of IO therapy. We will consider this in the future. We also discussed the upcoming ANGLED study of [id number]. She did indicate that her breast mass increased in size of NA taxol, which we will want to consider in the future.   Ms. WParsellwill return in 3 weeks for continued care and knows to contact [place] any questions or concerns in the interval.

## 2018-04-01 ENCOUNTER — Ambulatory Visit: Payer: 59 | Admitting: Physical Therapy

## 2018-04-02 ENCOUNTER — Other Ambulatory Visit: Payer: Self-pay

## 2018-04-02 DIAGNOSIS — C7931 Secondary malignant neoplasm of brain: Secondary | ICD-10-CM

## 2018-04-02 NOTE — Progress Notes (Signed)
Referral sent for PT/OT eval at home.

## 2018-04-04 ENCOUNTER — Ambulatory Visit: Payer: 59 | Admitting: Oncology

## 2018-04-04 ENCOUNTER — Inpatient Hospital Stay: Payer: 59 | Attending: Oncology | Admitting: Oncology

## 2018-04-04 VITALS — BP 152/67 | HR 85 | Temp 99.3°F | Resp 17 | Ht 62.0 in | Wt 150.7 lb

## 2018-04-04 DIAGNOSIS — R51 Headache: Secondary | ICD-10-CM | POA: Insufficient documentation

## 2018-04-04 DIAGNOSIS — C50412 Malignant neoplasm of upper-outer quadrant of left female breast: Secondary | ICD-10-CM | POA: Diagnosis present

## 2018-04-04 DIAGNOSIS — C7951 Secondary malignant neoplasm of bone: Secondary | ICD-10-CM | POA: Insufficient documentation

## 2018-04-04 DIAGNOSIS — Z170 Estrogen receptor positive status [ER+]: Secondary | ICD-10-CM | POA: Diagnosis not present

## 2018-04-04 DIAGNOSIS — Z923 Personal history of irradiation: Secondary | ICD-10-CM | POA: Insufficient documentation

## 2018-04-04 DIAGNOSIS — Z801 Family history of malignant neoplasm of trachea, bronchus and lung: Secondary | ICD-10-CM | POA: Insufficient documentation

## 2018-04-04 DIAGNOSIS — C7931 Secondary malignant neoplasm of brain: Secondary | ICD-10-CM | POA: Insufficient documentation

## 2018-04-04 DIAGNOSIS — Z79811 Long term (current) use of aromatase inhibitors: Secondary | ICD-10-CM | POA: Diagnosis not present

## 2018-04-04 DIAGNOSIS — G893 Neoplasm related pain (acute) (chronic): Secondary | ICD-10-CM | POA: Insufficient documentation

## 2018-04-04 DIAGNOSIS — M8580 Other specified disorders of bone density and structure, unspecified site: Secondary | ICD-10-CM | POA: Insufficient documentation

## 2018-04-04 DIAGNOSIS — G620 Drug-induced polyneuropathy: Secondary | ICD-10-CM | POA: Diagnosis not present

## 2018-04-04 DIAGNOSIS — C50912 Malignant neoplasm of unspecified site of left female breast: Secondary | ICD-10-CM

## 2018-04-04 DIAGNOSIS — Z79899 Other long term (current) drug therapy: Secondary | ICD-10-CM | POA: Insufficient documentation

## 2018-04-04 DIAGNOSIS — C7949 Secondary malignant neoplasm of other parts of nervous system: Secondary | ICD-10-CM | POA: Insufficient documentation

## 2018-04-04 DIAGNOSIS — C779 Secondary and unspecified malignant neoplasm of lymph node, unspecified: Secondary | ICD-10-CM

## 2018-04-04 MED ORDER — OXYCODONE HCL ER 60 MG PO T12A: 180.0000 mg | EXTENDED_RELEASE_TABLET | Freq: Three times a day (TID) | ORAL | 0 refills | Status: DC

## 2018-04-04 MED ORDER — OXYCODONE HCL 20 MG PO TABS: 60.0000 mg | ORAL_TABLET | Freq: Four times a day (QID) | ORAL | 0 refills | Status: DC | PRN

## 2018-04-04 NOTE — Progress Notes (Signed)
Alison [name]  [phone] Fax:([phone]) [phone]   ID: Kearney Hard DOB: 07-18-1961  MR#: [id number]  TGY#:[id number]  Patient Care Team: [name], MD as PCP - General (Internal Medicine) Erroll Luna, MD as Consulting Physician (General Surgery) Magrinat, Virgie Dad, MD as Consulting Physician (Oncology) Aloha Gell, MD as Consulting Physician (Obstetrics and Gynecology) Harriett Sine, MD as Consulting Physician (Dermatology) Benson Norway, RN as Registered Nurse (Oncology) Eppie Gibson, MD as Attending Physician (Radiation Oncology) Clydell Hakim, MD as Consulting Physician (Anesthesiology) Marletta Lor as Consulting Physician (Internal Medicine) Torok, [place] A, MD as Referring Physician (Radiation Oncology) PCP: [name], MD OTHER MD:  CHIEF COMPLAINT: Estrogen receptor positive breast cancer  CURRENT TREATMENT:exemestane, [everolimus], denosumab/Xgeva  INTERVAL HISTORY: Alison [name] returns today for a follow-up and treatment of her 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 She continues on exemestane, with good tolerance.  She never started the everolimus and of course at this point it would not be appropriate to begin it.  As far as the exemestane is concerned, she denies any unusual side effects.   She receives monthly denosumab/ Xgeva. She tolerates this well without complications from the injection.  Her next dose will be 04/09/2018  To summarize the recent history: She was seen in the ED after her last visit on 03/19/2018 due to a fall.  She had vertigo and light-headedness. She had a CT cervical spine on 03/19/2018 showing: No acute osseous abnormality identified in the cervical spine. Known widespread osseous metastatic disease.  Brain MRI on 03/19/2018 showed: Extensive abnormal leptomeningeal enhancement with greatest involvement of the posterior fossa most compatible with  metastatic disease. Cerebellar edema with partial fourth ventricle effacement. No evidence of obstructive hydrocephalus at this time. Extensive osseous metastases.  A total spinal mets MRI on 03/21/2018 showed: Diffuse sclerotic osseous metastases involving essentially all levels throughout the cervical, thoracic, and lumbar spine as well as the visible sacrum and pelvis. No extra osseous or epidural tumor. Chronic compression deformities involving the T10 through L1 vertebral bodies, stable from previous. No acute pathologic fracture Identified.  After that she met with Dr. Mickeal Skinner, who suggested partial brain radiation, and then with Dr. Ouida Sills at [facility] who suggested whole brain radiation and then very likely intrathecal treatment preferably under study.   She started whole brain radiation on 03/31/2018 under Dr. Biagio Quint she is tolerating that well so far.  Her dexamethasone is being dropped today to 4 mg twice daily from 8 mg twice daily previously.   REVIEW OF SYSTEMS:  She has increased appetite and insomnia. She feels that her headaches are getting worse.  She can see easier with her left eye, but both eyes are blurry. She feels symptoms of accomodation.   She is more nauseated. She take compazine as needed. For pain, she takes 3 every 8 hrs oxycontin and short acting 3 every 4 hours shot acting.   She is using Colace for constipation. She has bowel movements 2-3 times per week and is soft.  She does sitting aerobic exercises. She has not fallen since her last fall here.  She tripped up there stairs due to wearing flip-flops. She uses a walker at home, but she does not feel safe. She does not have a wheelchair at home.    She feels lethargic when she takes Carisoprodol. She also has loss of control of her muscles with tightening, and then they relax.    BREAST CANCER HISTORY: From the  original intake note:  Alison [name] had routine screening mammography with [facility]  February 20 11/14/2014 showing a possible mass in the right breast. Diagnostic right mammography with right breast ultrasonography 11/01/2014 found the breast density to be category C. In the upper outer quadrant of the right breast there was a partially obscured mass which on physical exam was palpable as an area of thickening at the 10:00 position 8 cm from the nipple. Ultrasound showed multiple cysts in the upper outer right breast corresponding to the mass in question.  In November 2016 the patient felt a change in the upper left breast and brought it to her primary care physician's attention. Diagnostic left mammography with tomosynthesis and left breast ultrasonography at the [facility] December 01/14/2015 found trabecular thickening throughout the left breast with skin thickening, but no circumscribed mass or suspicious calcifications. A rounded mass in the left axilla measured 6 mm. There was an additional mildly prominent lymph node in the left axilla which was what the patient had been palpating. Ultrasonography showed ill-defined swelling throughout the inner left breast with overlying skin thickening.. At the 2:00 position 4 cm from the nipple there was an irregular hypoechoic mass measuring 2.3 cm. A second mass at the 1:00 area 3 cm from the nipple measured 1 cm. The distance between these 2 masses was 1.8 cm. There was also a round hypoechoic left axillary mass measuring 0.6 cm. Overall the was an area of 3.7 cm of mixed echogenicity corresponding to the area of palpable soft tissue swelling.   On 08/04/2015 the patient underwent biopsy of the mass at the 2:00 axis 4 cm from the nipple and a second biopsy was performed of the hypoechoic mass at the 1:00 position 3 cm from the nipple. Biopsy of the suspicious nodule in the lower left axilla was attempted but could not be completed as the mass became obscured after administration of lidocaine.  The pathology from both left breast biopsies  08/04/2015 ([id number]) showed invasive ductal carcinoma, grade 3. The prognostic profile obtained from the larger tumor showed estrogen receptor to be strongly positive at 95%, progesterone receptor positive at 30%, with an MIB-1 of 12%, and no HER-2 amplification, the signals ratio being 1.22 and the number per cell 2.20.  On 08/09/2015 the patient underwent biopsy of this suspicious left axillary lymph node noted above, and this showed ([id number]) metastatic carcinoma with extracapsular extension.  The patient's subsequent history is as detailed below    PAST MEDICAL HISTORY: Past Medical History:  Diagnosis Date  . Anxiety   . Asthma     seasonal asthma  . Breast cancer ([place])    metastatic breast cancer  . Complication of anesthesia 2007   aspiration pneumonia post hysterectomy.  Blood pressure would drop low- but no problems 01/2016- surgery  . History of blood transfusion   . History of mitral valve prolapse 1988   with palpitations  . History of radiation therapy 04/02/16- 05/21/16   Left Chest Wall, SCV, Axilla, IM nodes  . Hypertension    no meds  . Neuropathy    with chemo  . Osteopenia determined by x-ray   . Pneumonia    1980's and 1990's  . Raynaud's disease   . 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 Skin cancer 03/12/2014   basal- back    PAST SURGICAL HISTORY: Past Surgical History:  Procedure Laterality Date  . ABDOMINAL HYSTERECTOMY  2007  with BSO  . APPENDECTOMY  2007  . AXILLARY LYMPH NODE DISSECTION Left 02/14/2016   Procedure: LEFT AXILLARY LYMPH NODE DISSECTION;  Surgeon: [name], MD;  Location: [place];  Service: General;  Laterality: Left;  . BREAST RECONSTRUCTION WITH PLACEMENT OF TISSUE EXPANDER AND FLEX HD (ACELLULAR HYDRATED DERMIS) Left 02/02/2016   Procedure: IMMEDIATE LEFT BREAST RECONSTRUCTION WITH PLACEMENT OF TISSUE EXPANDER AND FLEX HD (ACELLULAR HYDRATED DERMIS);  Surgeon: [name], DO;   Location: [place];  Service: Plastics;  Laterality: Left;  . BREAST REDUCTION SURGERY Right 03/28/2017   Procedure: RIGHT MAMMARY REDUCTION  (BREAST);  Surgeon: [name], DO;  Location: [place];  Service: Plastics;  Laterality: Right;  . BREAST SURGERY Left 02/02/16   mastectomy with reconstruction  . c-section  1991  . COLON RESECTION SIGMOID  12/02/2012   [facility]   . HERNIA REPAIR  2007  . LAPAROSCOPIC ENDOMETRIOSIS FULGURATION  1984, 1989  . MASTOPEXY Right 03/28/2017   Procedure: MASTOPEXY;  Surgeon: [name], DO;  Location: [place];  Service: Plastics;  Laterality: Right;  . Port- a- cath placement  07/2015  . PORT-A-CATH REMOVAL Right 02/02/2016   Procedure: REMOVAL PORT-A-CATH;  Surgeon: [name], MD;  Location: [place];  Service: General;  Laterality: Right;  . RADIOACTIVE SEED GUIDED AXILLARY SENTINEL LYMPH NODE Left 02/02/2016   Procedure: LEFT BREAST MASTECTOMY AND RADIOACTIVE SEED GUIDED LEFT SENTINEL LYMPH NODE BIOPSY ;  Surgeon: [name], MD;  Location: [place];  Service: General;  Laterality: Left;  . REMOVAL OF TISSUE EXPANDER AND PLACEMENT OF IMPLANT Left 03/28/2017   Procedure: REMOVAL OF TISSUE EXPANDER AND PLACEMENT OF IMPLANT;  Surgeon: [name], DO;  Location: [place];  Service: Plastics;  Laterality: Left;    FAMILY HISTORY Family History  Problem Relation Age of Onset  . Lung cancer Father    the patient's father died from lung cancer at the age of 3 in the setting of tobacco abuse. The patient's mother died at the age of 30 with pancreatic cancer. The patient had no siblings. There is no history of breast or ovarian cancer in the family.  GYNECOLOGIC HISTORY:  No LMP recorded. Patient has had a hysterectomy. Menarche age 1, first live birth age 69. The patient is GX P1. She is status post total hysterectomy with bilateral  salpingo-oophorectomy. She has been on hormone replacement since that time and is tapering to off at the time of this dictation.   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 She used to work at [employer]. Her husband [name] works for [employer] division at a [employer] their son [name] is currently at home.    ADVANCED DIRECTIVES: Not in place   HEALTH MAINTENANCE: Social History   Tobacco Use  . Smoking status: Former Smoker    Packs/day: 1.00    Years: 10.00    Pack years: 10.00    Types: Cigarettes    Last attempt to quit: 11/25/2008    Years since quitting: 9.3  . Smokeless tobacco: Never Used  . Tobacco comment: on and off smoker never consistent  Substance Use Topics  . Alcohol use: Yes    Comment: social  . Drug use: No     Colonoscopy: April 2014/ Raynaldo Opitz at [facility]  PAP: s/p hysterectomy  Bone density: August 2016/ osteopenia  Lipid panel:  Allergies  Allergen Reactions  .  Sulfa Antibiotics Other (See Comments)    Blisters in mouth    Current Outpatient Medications  Medication Sig Dispense Refill  . amLODipine (NORVASC) 5 MG tablet Take 5 mg by mouth daily.    . Ascorbic Acid (VITAMIN C) 1000 MG tablet Take 1,000 mg by mouth 2 (two) times daily.    . cetirizine (ZYRTEC) 10 MG tablet Take 10 mg by mouth daily.    . Cholecalciferol (VITAMIN D3) 10000 units capsule Take 10,000 Units by mouth daily.     Marland Kitchen dexamethasone (DECADRON) 4 MG tablet Take 1 tablet (4 mg total) by mouth 2 (two) times daily. 60 tablet 0  . escitalopram (LEXAPRO) 20 MG tablet Take 1 tablet (20 mg total) by mouth daily. 90 tablet 4  . exemestane (AROMASIN) 25 MG tablet Take 1 tablet (25 mg total) by mouth daily after breakfast. 90 tablet 4  . fluticasone (FLONASE) 50 MCG/ACT nasal spray Place 2 sprays into both nostrils daily. Reported on 11/18/2015    . gabapentin (NEURONTIN) 300 MG capsule Take 4 capsules (1,200 mg total) by mouth at bedtime. TAKE 2 CAPSULES IN  THE MORNING AND TAKE 2 CAPSULES MID-DAY AND TAKE 4CAPSULES AT BEDTIME 240 capsule 4  . LORazepam (ATIVAN) 0.5 MG tablet Take 1 tablet (0.5 mg total) by mouth at bedtime. 90 tablet 3  . meclizine (ANTIVERT) 25 MG tablet Take 1 tablet (25 mg total) by mouth 3 (three) times daily as needed for dizziness. 30 tablet 0  . Misc. Devices (GETGO ROLLING WALKER) MISC 1 Units by Does not apply route continuous as needed. 1 each 0  . Multiple Vitamin (MULTIVITAMIN) tablet Take 1 tablet by mouth daily.    . ondansetron (ZOFRAN) 8 MG tablet Take 1 tablet (8 mg total) by mouth every 8 (eight) hours as needed for nausea or vomiting. 20 tablet 5  . oxyCODONE (OXYCONTIN) 60 MG 12 hr tablet Take 180 mg by mouth every 8 (eight) hours. 84 each 0  . Oxycodone HCl 20 MG TABS Take 3 tablets (60 mg total) by mouth 4 (four) times daily as needed. 270 tablet 0  . Probiotic Product (PROBIOTIC DAILY PO) Take 1 capsule by mouth daily. [place] prochlorperazine (COMPAZINE) 10 MG tablet Take 1 tablet (10 mg total) by mouth every 6 (six) hours as needed (Nausea or vomiting). 90 tablet 2  . traZODone (DESYREL) 50 MG tablet TAKE 3 TABLETS AT BEDTIME 90 tablet 2  . zolpidem (AMBIEN) 5 MG tablet TAKE ONE TABLET AT BEDTIME AS NEEDED FOR SLEEP 30 tablet 0   No current facility-administered medications for this visit.     OBJECTIVE: Middle-aged white woman in no acute distress, examined in a wheelchair  Vitals:   04/04/18 1300  BP: (!) 152/67  Pulse: 85  Resp: 17  Temp: 99.3 F (37.4 C)  SpO2: 98%     Body mass index is 27.56 kg/m.    ECOG FS:2 - Symptomatic, <50% confined to bed   Sclerae unicteric, pupils round and equal, EOMs intact Oropharynx clear, no thrush or other lesions No cervical or supraclavicular adenopathy Lungs no rales or rhonchi Heart regular rate and rhythm Abd soft, obese, nontender, positive bowel sounds MSK no focal spinal tenderness, Neuro: nonfocal,  well oriented, positive affect; no tremor noted Breasts: Deferred   LAB RESULTS:  CMP     Component Value Date/Time   NA 139 03/19/2018 0754   NA 138 08/14/2017 1042  K 3.9 03/19/2018 0754   K 4.2 08/14/2017 1042   CL 103 03/19/2018 0754   CO2 26 03/19/2018 0754   CO2 26 08/14/2017 1042   GLUCOSE 115 (H) 03/19/2018 0754   GLUCOSE 129 08/14/2017 1042   BUN 9 03/19/2018 0754   BUN 8.3 08/14/2017 1042   CREATININE 0.69 03/19/2018 0754   CREATININE 0.74 01/15/2018 1409   CREATININE 0.8 08/14/2017 1042   CALCIUM 8.8 (L) 03/19/2018 0754   CALCIUM 9.6 08/14/2017 1042   PROT 6.9 03/19/2018 0754   PROT 6.5 08/14/2017 1042   ALBUMIN 3.7 03/19/2018 0754   ALBUMIN 3.4 (L) 08/14/2017 1042   AST 25 03/19/2018 0754   AST 35 (H) 01/15/2018 1409   AST 63 (H) 08/14/2017 1042   ALT 19 03/19/2018 0754   ALT 26 01/15/2018 1409   ALT 53 08/14/2017 1042   ALKPHOS 70 03/19/2018 0754   ALKPHOS 84 08/14/2017 1042   BILITOT 0.4 03/19/2018 0754   BILITOT 0.4 01/15/2018 1409   BILITOT 0.44 08/14/2017 1042   GFRNONAA >60 03/19/2018 0754   GFRNONAA >60 01/15/2018 1409   GFRAA >60 03/19/2018 0754   GFRAA >60 01/15/2018 1409    INo results found for: SPEP, UPEP  Lab Results  Component Value Date   WBC 4.4 03/19/2018   NEUTROABS 3.0 03/19/2018   HGB 12.7 03/19/2018   HCT 36.8 03/19/2018   MCV 98.6 03/19/2018   PLT 216 03/19/2018      Chemistry      Component Value Date/Time   NA 139 03/19/2018 0754   NA 138 08/14/2017 1042   K 3.9 03/19/2018 0754   K 4.2 08/14/2017 1042   CL 103 03/19/2018 0754   CO2 26 03/19/2018 0754   CO2 26 08/14/2017 1042   BUN 9 03/19/2018 0754   BUN 8.3 08/14/2017 1042   CREATININE 0.69 03/19/2018 0754   CREATININE 0.74 01/15/2018 1409   CREATININE 0.8 08/14/2017 1042      Component Value Date/Time   CALCIUM 8.8 (L) 03/19/2018 0754   CALCIUM 9.6 08/14/2017 1042   ALKPHOS 70 03/19/2018 0754   ALKPHOS 84 08/14/2017 1042   AST 25 03/19/2018 0754    AST 35 (H) 01/15/2018 1409   AST 63 (H) 08/14/2017 1042   ALT 19 03/19/2018 0754   ALT 26 01/15/2018 1409   ALT 53 08/14/2017 1042   BILITOT 0.4 03/19/2018 0754   BILITOT 0.4 01/15/2018 1409   BILITOT 0.44 08/14/2017 1042       No results found for: LABCA2  No components found for: LABCA125  No results for input(s): INR in the last 168 hours.  Urinalysis    Component Value Date/Time   COLORURINE YELLOW 11/12/2015 [place] 11/12/2015 0837   LABSPEC 1.005 03/18/2017 1223   PHURINE 6.0 03/18/2017 1223   PHURINE 6.5 11/12/2015 0837   GLUCOSEU Negative 03/18/2017 1223   HGBUR Large 03/18/2017 1223   HGBUR MODERATE (A) 11/12/2015 0837   BILIRUBINUR Negative 03/18/2017 1223   KETONESUR Negative 03/18/2017 1223   [place] 11/12/2015 0837   PROTEINUR Negative 03/18/2017 1223   PROTEINUR NEGATIVE 11/12/2015 0837   UROBILINOGEN 0.2 03/18/2017 1223   NITRITE Negative 03/18/2017 1223   NITRITE NEGATIVE 11/12/2015 0837   LEUKOCYTESUR Moderate 03/18/2017 1223    STUDIES: Result Date: 03/10/2018 at [place] - : TECHNIQUE: Utilizing automatic exposure control, 5 mm axial noncontrast CT imaging performed through the brain. FINDINGS: There is questionable  focal hypodensity involving the cerebellar vermis. Moderate atrophy. Negative fracture axial collection, midline shift nor intracranial hemorrhage. CONCLUSIONS: Question vermian lesion versus acute stroke. Recommend further evaluation with contrast-enhanced MRI. Dictated By: Pearson Forster, MD 03/10/2018 19:45 Electronically Signed by: Pearson Forster, MD 03/10/2018 19:47    Ct Cervical Spine Wo Contrast  Result Date: 03/19/2018 CLINICAL DATA:  Neck pain after a fall today. Near syncopal episode. Headache. Vertigo for 2 weeks. History of metastatic breast cancer. EXAM: CT CERVICAL SPINE WITHOUT CONTRAST TECHNIQUE: Multidetector CT imaging of the cervical spine  was performed without intravenous contrast. Multiplanar CT image reconstructions were also generated. COMPARISON:  PET-CT 02/03/2018 and cervical spine CT 02/26/2017 FINDINGS: Alignment: Chronic mild reversal of the normal cervical lordosis. Unchanged slight anterolisthesis of C4 on C5 and C5 on C6. Skull base and vertebrae: Widespread, predominantly sclerotic osseous metastases have progressed from the 2018 cervical spine CT but are similar to the more recent PET-CT. No acute fracture is identified. Soft tissues and spinal canal: No gross canal hematoma or epidural tumor on this unenhanced study. No prevertebral fluid. Disc levels: Similar degenerative changes compared to the 2018 study. Mild disc space narrowing and spurring at C5-6 and C6-7. Multilevel facet arthrosis, most severe on the left at C4-5 resulting in mild left neural foraminal stenosis. No high-grade osseous neural foraminal or spinal canal stenosis identified. Upper chest: Clear lung apices. Other: Mild right and moderate left carotid bifurcation calcified atherosclerosis. IMPRESSION: 1. No acute osseous abnormality identified in the cervical spine. 2. Known widespread osseous metastatic disease. Electronically Signed   By: Logan Bores M.D.   On: 03/19/2018 11:17   Mr Jeri Cos And Wo Contrast  Result Date: 03/19/2018 CLINICAL DATA:  Recent near syncopal episode. Possible cerebellar lesion on outside head CT. Vertigo. Fall today. History of breast cancer with osseous metastases. EXAM: MRI HEAD WITHOUT AND WITH CONTRAST TECHNIQUE: Multiplanar, multiecho pulse sequences of the brain and surrounding structures were obtained without and with intravenous contrast. CONTRAST:  67m MULTIHANCE GADOBENATE DIMEGLUMINE 529 MG/ML IV SOLN COMPAR[facility]  [facility] FINDINGS: Some sequences are mildly motion degraded. Brain: There is edema throughout the superior and medial cerebellar hemispheres  bilaterally with involvement of the vermis, posterior pons, and tectum. 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 There is also leptomeningeal enhancement in the left occipital lobe and interpeduncular cistern. Masslike extra-axial enhancement in the pineal region measures 2.1 x 1.2 cm. There is likely a small amount of leptomeningeal enhancement along the ventral pons and medulla, and there is abnormal enhancement along the left seventh and eighth cranial nerve complex in the internal auditory canal. There is also abnormal enhancement involving Meckel's cave on the left and likely involving the cisternal segment of the left trigeminal. T2 shine through is noted in the cerebellum on diffusion imaging. No definite acute infarct is identified. There is no evidence of intracranial hemorrhage, midline shift, or extra-axial fluid collection. Posterior fossa edema partially effaces the fourth ventricle. The lateral and third ventricles are normal in size without evidence of obstructive hydrocephalus. A few small foci of cerebral white matter T2 hyperintensity are nonspecific. Vascular: Major intracranial vascular flow voids are preserved. Skull and upper cervical spine: Known widespread osseous metastatic disease involving the skull and included upper cervical spine. Extensive skull base involvement including of the clivus. Sinuses/Orbits: Unremarkable orbits. Clear paranasal sinuses. Trace left mastoid effusion. Other: None. IMPRESSION: 1. Extensive abnormal leptomeningeal  enhancement with greatest involvement of the posterior fossa most compatible with metastatic disease. Cerebellar edema with partial fourth ventricle effacement. No evidence of obstructive hydrocephalus at this time. 2. Extensive osseous metastases. Electronically Signed   By: Logan Bores M.D.   On: 03/19/2018 12:36   Mr Total Spine Mets Screening  Result Date: 03/21/2018 CLINICAL DATA:   Initial evaluation for metastatic disease, history of breast cancer, staging. EXAM: MRI TOTAL SPINE WITHOUT AND WITH CONTRAST TECHNIQUE: Multisequence MR imaging of the spine from the cervical spine to the sacrum was performed prior to and following IV contrast administration for evaluation of spinal metastatic disease. CONTRAST:  68m MULTIHANCE GADOBENATE DIMEGLUMINE 529 MG/ML IV SOLN COMPARISON:  Prior CT from 03/19/2018 as well as PET-CT from 02/03/2018 and MRI from 12/07/2017 FINDINGS: MRI CERVICAL SPINE FINDINGS Alignment: Straightening with slight reversal of the normal cervical lordosis. Trace anterolisthesis of C4 on C5, likely chronic. Vertebrae: Diffuse sclerotic metastatic disease seen throughout the cervical spine with probable areas of red marrow reconversion. Involvement of essentially all levels. Vertebral body heights maintained without pathologic fracture. No extra osseous tumor. Cord: Signal intensity within the cervical spinal cord is normal. No intrathecal lesions or abnormal enhancement. Posterior Fossa, vertebral arteries, paraspinal tissues: Visualized brain and posterior fossa within normal limits. Craniocervical junction normal. Paraspinous and prevertebral soft tissues normal. Disc levels: Degenerative disc bulging present at C5-6 through C7-T1 without significant spinal stenosis. Mild multilevel facet hypertrophy within the cervical spine. Foramina are grossly patent. MRI THORACIC SPINE FINDINGS Alignment: Mild straightening of the normal thoracic kyphosis. No listhesis or malalignment. Vertebrae: Diffuse osseous sclerotic metastases seen throughout the thoracic spine with areas of red marrow reconversion. Involvement of the centrally all levels. Stable height loss at the superior endplates of TT03 TT46 and T12. Vertebral body heights otherwise maintained no new pathologic fracture. No extra osseous extension of tumor. Cord: Signal intensity within the thoracic spinal cord is normal. No  intrathecal lesions or abnormal enhancement. Paraspinal and other soft tissues: Paraspinous soft tissues demonstrate no acute finding. Disc levels: Minimal disc bulging noted within the upper thoracic spine. No significant stenosis. MRI LUMBAR SPINE FINDINGS Segmentation: Normal segmentation. Lowest well-formed disc labeled the L5-S1 level. Alignment: Normal alignment with preservation of the normal lumbar lordosis. No listhesis. Vertebrae: Diffuse sclerotic osseous metastases throughout the lumbar spine, essentially involving all levels. Involvement of the visualize sacrum and pelvis. Height loss at the superior endplate of L1 is chronic in appearance, and stable from previous. Schmorl's node deformity at the superior endplate of L2 and inferior endplate of L3 also chronic in appearance. No acute pathologic fracture. No extra osseous tumor. Conus medullaris: Extends to the L1-2 level and appears normal. No intrathecal tumor or abnormal enhancement. Paraspinal and other soft tissues: Paraspinous soft tissues demonstrate no acute finding. Disc levels: Minor disc bulging at L1-2 through L4-5 without significant stenosis. Foramina appear patent. IMPRESSION: 1. Diffuse sclerotic osseous metastases involving essentially all levels throughout the cervical, thoracic, and lumbar spine as well as the visible sacrum and pelvis. No extra osseous or epidural tumor. 2. Chronic compression deformities involving the T10 through L1 vertebral bodies, stable from previous. No acute pathologic fracture identified. Electronically Signed   By: BJeannine BogaM.D.   On: 03/21/2018 21:52      PROTOCOLS: Considering intrathecal chemotherapy study at [facility] 57y.o. [place] woman status post biopsy of 2 separate masses in the left breast upper outer quadrant 08/04/2015, clinically mT2 N1, stage IIb/IIIa invasive ductal carcinoma, grade  3, the larger mass being estrogen and progesterone receptor positive, HER-2  negative, with an MIB-1 of 12%  (a) biopsy of a left axillary lymph node 08/09/2015 was positive, with extracapsular extension  (1) neoadjuvant chemotherapy started 08/25/2015 consisting of doxorubicin and cyclophosphamide in dose dense fashion 4, completed 10/06/2015,  followed by weekly paclitaxel 10 completed 12/23/2015  (a) final 2 planned cycles of weekly paclitaxel omitted because of neuropathy concerns  (2) status post left mastectomy with targeted axillary dissection 02/02/2016 for a ypT2 ypN2a, stage IIIa invasive ductal carcinoma, grade 2, with negative margins, repeat prognostic panel showing estrogen receptor positive, but progesterone receptor and HER-2 negative  (a) completion axillary lymph node dissection 02/14/2016 showed an additional 2 out of 8 lymph nodes to be involved (final count 6 out of 10 lymph nodes positive  (b) expander placement at the time of left mastectomy  (3) adjuvant capecitabine starting concurrently with radiation--discontinued 05/21/2016  (4) adjuvant  radiation started 04/02/2016, completed 05/21/2016  (5) anastrozole started October 2017, discontinued January 2019  (a) bone density August 2016 showed osteopenia  (b) repeat bone density 09/07/2016 shows a T score of -1.7.  (c) the patient is status post total abdominal hysterectomy with bilateral salpingo-oophorectomy.  (6) chronic pain: Starting OxyContin 20 mg twice a day with oxycodone as needed for breakthrough pain 03/02/2016  (a) failed transition to tramadol, gabapentin, naproxen   (b) started methadone 5 mg TID as of 12/20/2016-- not tolerated  (c) on OxyContin and Oxycodone as discussed below  (7) Research:  (a) B-WEL study ([facility] [id number])--enrolled in [employer] [facility] (received education)  (b) [facility] AFT-05 study: randomized to treatment, started palbociclib 07/26/2016   (i) dose reduced to 100 mg, then to 75 mg because of cytopenias   (ii) went off study study May 2018 because of persistent  cytopenias  (c) taxane study 12/20/2016  (d) aspirin study  (e) went off studies as metastatic disease developed in June 2018  METASTATIC DISEASE 02/15/2017: CT scan of the lumbar spine and 02/27/2017 CT of the thoracic and cervical spine shows multiple bone lesions but no evidence of cord compromise; bone lesions are confirmed on PET scan 02/22/2017 which however shows no evidence of visceral disease  (a) CA-27-29 is noninformative  (b) left upper lobe changes noted on PET scan 02/22/2017 felt to be secondary to radiation  (c)repeat PET scan 09/05/2017 shows again no visceral disease, stable bony metastases  (d) PET scan 02/03/2018 shows no visceral disease, new bone lesions  (8) continued anastrozole, abemaciclib added 02/22/2017, taken 2 weeks on, 2 weeks off   (a) fulvestrant started 02/22/2017 ,   (b) anastrozole discontinued January 2019  (c) fulvestrant and abemaciclib discontinued June 2019 with progression  (9) yearly zolendronate given 09/26/2016, changed to monthly denosumab/Xgeva starting 03/19/2017  (10) radiation 04/01/2017 - 04/23/2017 Site/dose:   The lumbar spine was treated to 35 Gy in 14 fractions of 2.5 Gy.   (11) chronic pain: Currently on OxyContin 160 grams p.o. TID/ oxycodone 20-40 mg Q 4h PRN  (i) bowel prophylaxis in place  (ii) also followed by Dr. 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  She is tolerating radiation quite well  and her Decadron dose is being decreased.  She tells me she is considering a study involving intrathecal treatment at [facility], which may be available in September or  October.  If so she will receive those treatments there.  If she does not qualify and she wishes to receive intrathecal methotrexate of course she can do that here or at [facility] which her weight is most convenient.  Today we reviewed her pain medication and I refilled her prescriptions.  She is high fall risk and really cannot walk safely even with a walker.  I wrote her a prescription for a wheelchair and encouraged him to get a ramp since she has approximately 14 steps to get up to their home.  She will receive her next Xgeva dose 04/09/2018 and then every 28 days.  We will take an opportunity at each 1 of those treatments to update her situation and also to refill her pain medications, giving her a 28-day supply each time.  Otherwise she knows to call for any problems that may develop before her next visit here.    Magrinat, Virgie Dad, MD  04/04/18 1:37 PM Medical Oncology and Hematology [facility] [address] [place], [place] [address] Tel. [phone]    Fax. [phone]  Alice Rieger, am acting as scribe for Chauncey Cruel MD.  I, Lurline Del MD, have reviewed the above documentation for accuracy and completeness, and I agree with the above.

## 2018-04-08 ENCOUNTER — Other Ambulatory Visit: Payer: Self-pay | Admitting: *Deleted

## 2018-04-08 ENCOUNTER — Encounter: Payer: 59 | Admitting: Physical Therapy

## 2018-04-08 ENCOUNTER — Encounter: Payer: Self-pay | Admitting: Oncology

## 2018-04-08 MED ORDER — MECLIZINE HCL 25 MG PO TABS: 25.0000 mg | ORAL_TABLET | Freq: Three times a day (TID) | ORAL | 1 refills | Status: AC | PRN

## 2018-04-09 ENCOUNTER — Encounter: Payer: Self-pay | Admitting: Oncology

## 2018-04-09 ENCOUNTER — Inpatient Hospital Stay: Payer: 59

## 2018-04-09 ENCOUNTER — Other Ambulatory Visit: Payer: Self-pay

## 2018-04-09 DIAGNOSIS — C50912 Malignant neoplasm of unspecified site of left female breast: Secondary | ICD-10-CM

## 2018-04-09 DIAGNOSIS — C50412 Malignant neoplasm of upper-outer quadrant of left female breast: Secondary | ICD-10-CM

## 2018-04-09 DIAGNOSIS — C779 Secondary and unspecified malignant neoplasm of lymph node, unspecified: Secondary | ICD-10-CM

## 2018-04-09 DIAGNOSIS — Z170 Estrogen receptor positive status [ER+]: Secondary | ICD-10-CM

## 2018-04-09 DIAGNOSIS — C7951 Secondary malignant neoplasm of bone: Secondary | ICD-10-CM | POA: Diagnosis not present

## 2018-04-09 DIAGNOSIS — M8580 Other specified disorders of bone density and structure, unspecified site: Secondary | ICD-10-CM

## 2018-04-09 LAB — COMPREHENSIVE METABOLIC PANEL
ALBUMIN: 3.6 g/dL (ref 3.5–5.0)
ALT: 35 U/L (ref 0–44)
ANION GAP: 14 (ref 5–15)
AST: 22 U/L (ref 15–41)
Alkaline Phosphatase: 87 U/L (ref 38–126)
BUN: 13 mg/dL (ref 6–20)
CO2: 29 mmol/L (ref 22–32)
Calcium: 9 mg/dL (ref 8.9–10.3)
Chloride: 96 mmol/L — ABNORMAL LOW (ref 98–111)
Creatinine, Ser: 0.68 mg/dL (ref 0.44–1.00)
GFR calc Af Amer: >60 mL/min (ref >60)
GFR calc non Af Amer: >60 mL/min (ref >60)
GLUCOSE: 100 mg/dL — AB (ref 70–99)
POTASSIUM: 3.9 mmol/L (ref 3.5–5.1)
SODIUM: 139 mmol/L (ref 135–145)
TOTAL PROTEIN: 6.6 g/dL (ref 6.5–8.1)
Total Bilirubin: 0.5 mg/dL (ref 0.3–1.2)

## 2018-04-09 LAB — CBC WITH DIFFERENTIAL/PLATELET
BASOS PCT: 0 %
Basophils Absolute: 0 10*3/uL (ref 0.0–0.1)
EOS ABS: 0 10*3/uL (ref 0.0–0.5)
Eosinophils Relative: 0 %
HCT: 38.7 % (ref 34.8–46.6)
Hemoglobin: 13 g/dL (ref 11.6–15.9)
Lymphocytes Relative: 6 %
Lymphs Abs: 0.3 10*3/uL — ABNORMAL LOW (ref 0.9–3.3)
MCH: 33 pg (ref 25.1–34.0)
MCHC: 33.7 g/dL (ref 31.5–36.0)
MCV: 98.1 fL (ref 79.5–101.0)
MONO ABS: 0.3 10*3/uL (ref 0.1–0.9)
MONOS PCT: 7 %
NEUTROS PCT: 87 %
Neutro Abs: 4.1 10*3/uL (ref 1.5–6.5)
PLATELETS: 127 10*3/uL — AB (ref 145–400)
RBC: 3.95 MIL/uL (ref 3.70–5.45)
RDW: 15 % — AB (ref 11.2–14.5)
WBC: 4.8 10*3/uL (ref 3.9–10.3)

## 2018-04-09 MED ORDER — DENOSUMAB 120 MG/1.7ML ~~LOC~~ SOLN
SUBCUTANEOUS | Status: AC
  Filled 2018-04-09: qty 1.7

## 2018-04-09 MED ORDER — DENOSUMAB 120 MG/1.7ML ~~LOC~~ SOLN
120.0000 mg | Freq: Once | SUBCUTANEOUS | Status: AC
  Administered 2018-04-09: 120 mg via SUBCUTANEOUS

## 2018-04-09 NOTE — Patient Instructions (Signed)

## 2018-04-22 ENCOUNTER — Other Ambulatory Visit: Payer: Self-pay | Admitting: *Deleted

## 2018-04-22 DIAGNOSIS — C50412 Malignant neoplasm of upper-outer quadrant of left female breast: Secondary | ICD-10-CM

## 2018-04-22 DIAGNOSIS — Z170 Estrogen receptor positive status [ER+]: Principal | ICD-10-CM

## 2018-04-22 MED ORDER — OXYCODONE HCL ER 60 MG PO T12A: 180.0000 mg | EXTENDED_RELEASE_TABLET | Freq: Three times a day (TID) | ORAL | 0 refills | Status: DC

## 2018-04-24 ENCOUNTER — Other Ambulatory Visit: Payer: Self-pay

## 2018-04-24 DIAGNOSIS — Z170 Estrogen receptor positive status [ER+]: Principal | ICD-10-CM

## 2018-04-24 DIAGNOSIS — C50412 Malignant neoplasm of upper-outer quadrant of left female breast: Secondary | ICD-10-CM

## 2018-04-24 MED ORDER — ZOLPIDEM TARTRATE 5 MG PO TABS: ORAL_TABLET | ORAL | 0 refills | Status: DC

## 2018-04-29 ENCOUNTER — Other Ambulatory Visit: Payer: Self-pay | Admitting: Oncology

## 2018-05-06 ENCOUNTER — Other Ambulatory Visit: Payer: Self-pay | Admitting: *Deleted

## 2018-05-06 DIAGNOSIS — C50412 Malignant neoplasm of upper-outer quadrant of left female breast: Secondary | ICD-10-CM

## 2018-05-06 DIAGNOSIS — Z170 Estrogen receptor positive status [ER+]: Principal | ICD-10-CM

## 2018-05-06 MED ORDER — OXYCODONE HCL ER 60 MG PO T12A: 180.0000 mg | EXTENDED_RELEASE_TABLET | Freq: Three times a day (TID) | ORAL | 0 refills | Status: DC

## 2018-05-07 ENCOUNTER — Other Ambulatory Visit: Payer: 59

## 2018-05-07 ENCOUNTER — Ambulatory Visit: Payer: 59

## 2018-05-07 ENCOUNTER — Other Ambulatory Visit: Payer: Self-pay | Admitting: Pharmacist

## 2018-05-07 MED ORDER — DENOSUMAB 120 MG/1.7ML ~~LOC~~ SOLN
SUBCUTANEOUS | Status: AC
  Filled 2018-05-07: qty 1.7

## 2018-05-08 ENCOUNTER — Other Ambulatory Visit: Payer: Self-pay | Admitting: *Deleted

## 2018-05-08 MED ORDER — OXYCODONE HCL 20 MG PO TABS
60.0000 mg | ORAL_TABLET | Freq: Four times a day (QID) | ORAL | 0 refills | Status: AC | PRN
Start: 2018-05-08 — End: ?

## 2018-05-21 ENCOUNTER — Telehealth: Payer: Self-pay

## 2018-05-21 DIAGNOSIS — C50412 Malignant neoplasm of upper-outer quadrant of left female breast: Secondary | ICD-10-CM

## 2018-05-21 DIAGNOSIS — Z170 Estrogen receptor positive status [ER+]: Principal | ICD-10-CM

## 2018-05-21 MED ORDER — OXYCODONE HCL ER 60 MG PO T12A: 180.0000 mg | EXTENDED_RELEASE_TABLET | Freq: Three times a day (TID) | ORAL | 0 refills | Status: AC

## 2018-05-21 NOTE — Telephone Encounter (Signed)
New prescription for oxcycontin placed in binder in triage department.  Husband is aware and states he will pick up today

## 2018-05-23 ENCOUNTER — Other Ambulatory Visit: Payer: Self-pay | Admitting: Oncology

## 2018-05-23 ENCOUNTER — Telehealth: Payer: Self-pay

## 2018-05-23 DIAGNOSIS — Z170 Estrogen receptor positive status [ER+]: Principal | ICD-10-CM

## 2018-05-23 DIAGNOSIS — C50412 Malignant neoplasm of upper-outer quadrant of left female breast: Secondary | ICD-10-CM

## 2018-05-23 NOTE — Telephone Encounter (Signed)
Pt called stating that she needed a prescription for her 20mg  oxycodone because "my husband threw away my bottle." On chart review a prescription was written on 9/12. I told pt this and she kept repeating "my husband threw away the bottle... He threw it away..."   I asked her if she had gotten the prescription wriitten on 9/12 filled and she said "no my husband threw away the bottle".  I told her that she should still have the prescription in that case.  She still kept saying that her husband threw it away and couldn't clearly state whether or not he threw away the actual prescription written on the 12 or if it was filled and then the bottle of medication was throne away. Pt informed that it was too soon to write another prescription and that she would need to go to the emergency room if her pain were not controlled.   I could hear pt's husband speaking in the background so I asked to speak to him.  He states that the patient does in fact have a bottle of 20mg  oxycodone on hand. No further needs at this time.

## 2018-06-04 ENCOUNTER — Ambulatory Visit: Payer: 59

## 2018-06-04 ENCOUNTER — Other Ambulatory Visit: Payer: 59

## 2018-06-09 ENCOUNTER — Telehealth: Payer: Self-pay | Admitting: Oncology

## 2018-06-09 NOTE — Telephone Encounter (Signed)
Cancel appointments per MD

## 2018-06-10 ENCOUNTER — Other Ambulatory Visit: Payer: Self-pay | Admitting: Oncology

## 2018-06-27 DEATH — deceased

## 2018-07-02 ENCOUNTER — Other Ambulatory Visit: Payer: 59

## 2018-07-02 ENCOUNTER — Ambulatory Visit: Payer: 59

## 2018-07-30 ENCOUNTER — Other Ambulatory Visit: Payer: 59

## 2018-07-30 ENCOUNTER — Ambulatory Visit: Payer: 59

## 2018-08-24 IMAGING — CT CT CERVICAL SPINE W/ CM
3 of 4 series · 12 of 33 positions shown, 14 images · IV contrast (iopamidol)
Comparison: PET scan 02/22/2017

CLINICAL DATA: Metastatic breast cancer. Widespread bony metastatic
disease demonstrated by PET scan.

EXAM:
CT CERVICAL SPINE WITH CONTRAST
CT THORACIC SPINE WITH CONTRAST
TECHNIQUE: Multidetector CT imaging of the cervical and thoracic spine was
performed with intravenous contrast. Multiplanar CT image
reconstructions were also generated.
CONTRAST:  100mL [id number] IOPAMIDOL ([id number]) INJECTION 61%

[Series 6: sagittal bone · sagittal · 0.35mm/px · 5 of 61 slices shown, 6 images]
[im 21/61  bone]
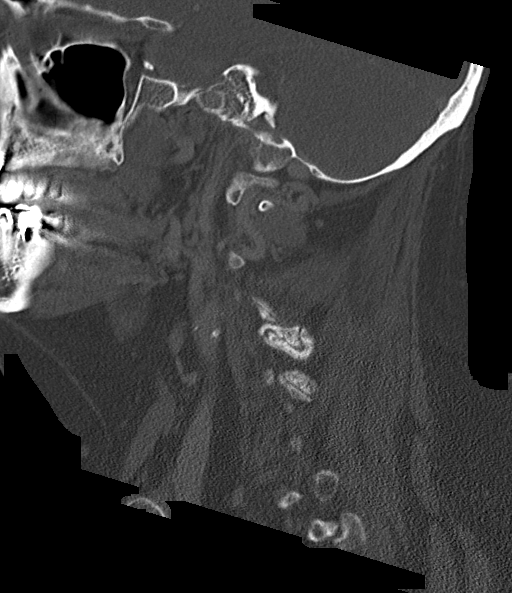
[im 26/61  bone]
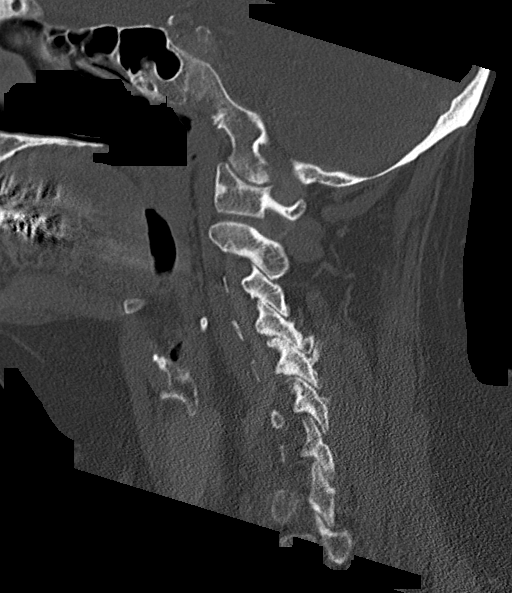
[im 31/61  soft-tissue]
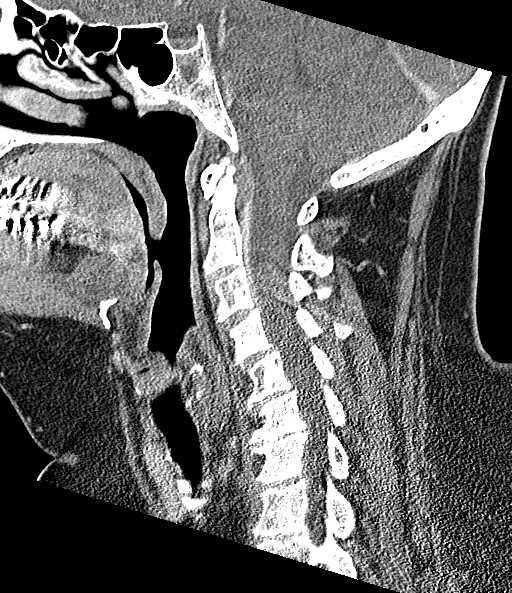
[im 31/61  bone]
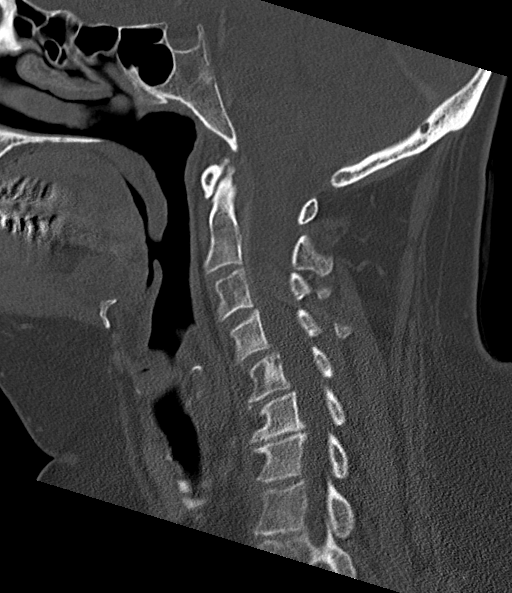
[im 36/61  bone]
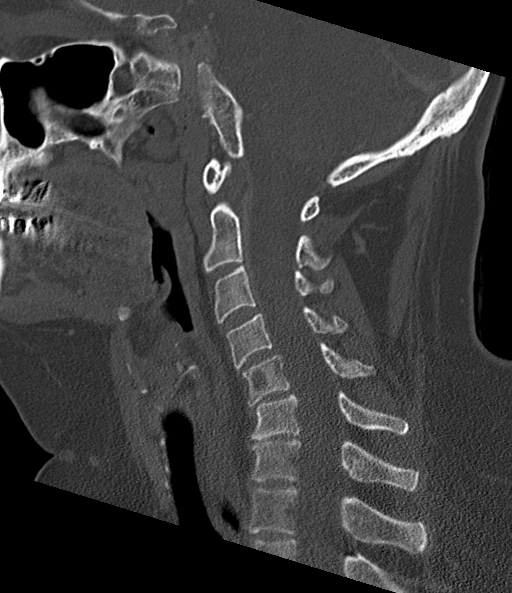
[im 41/61  bone]
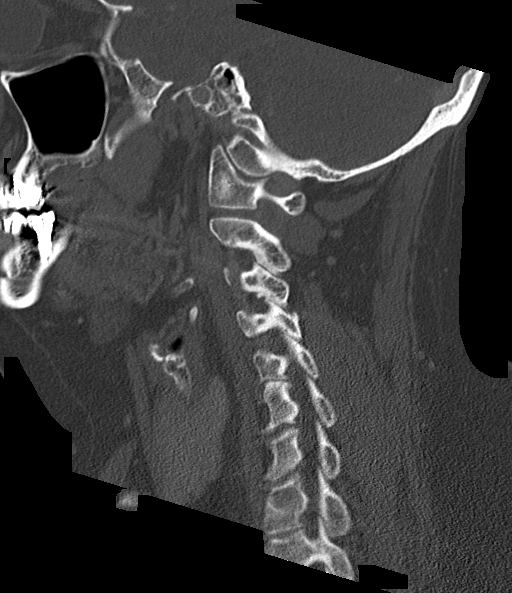

[Series 7: coronal bone · coronal · 0.26mm/px · 3 of 81 slices shown]
[im 20/81  bone]
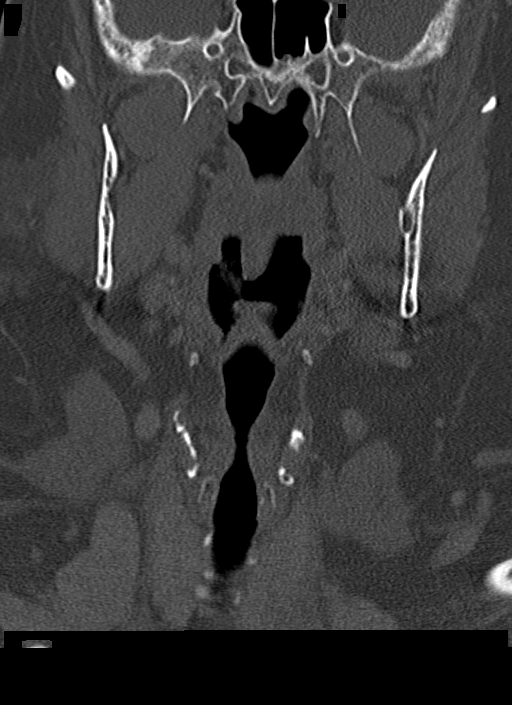
[im 34/81  bone]
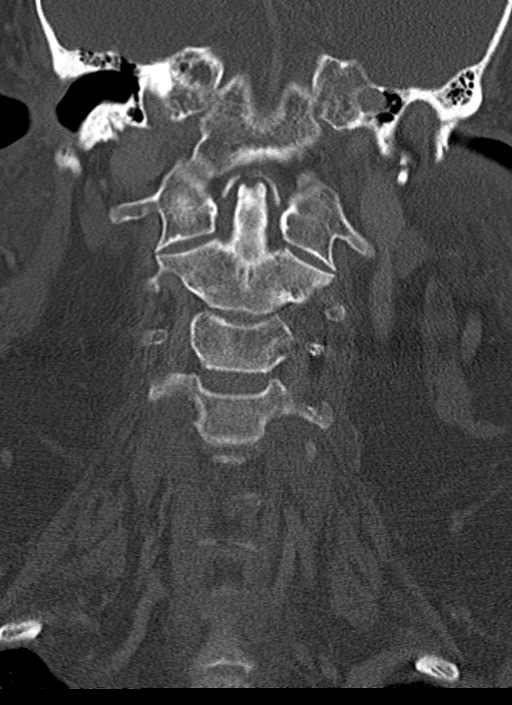
[im 47/81  bone]
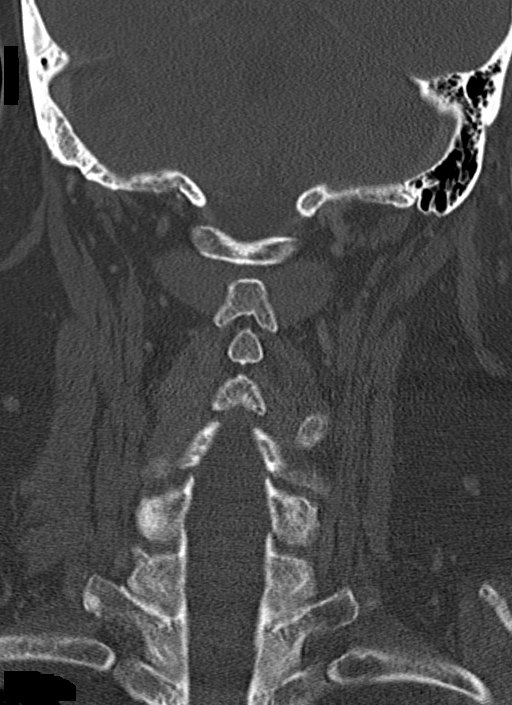

[Series 8: orthogonal bone · axial · 0.23mm/px · z∈[+1514,+1635]mm · 4 of 96 slices shown, 5 images]
[im 16/96  soft-tissue]
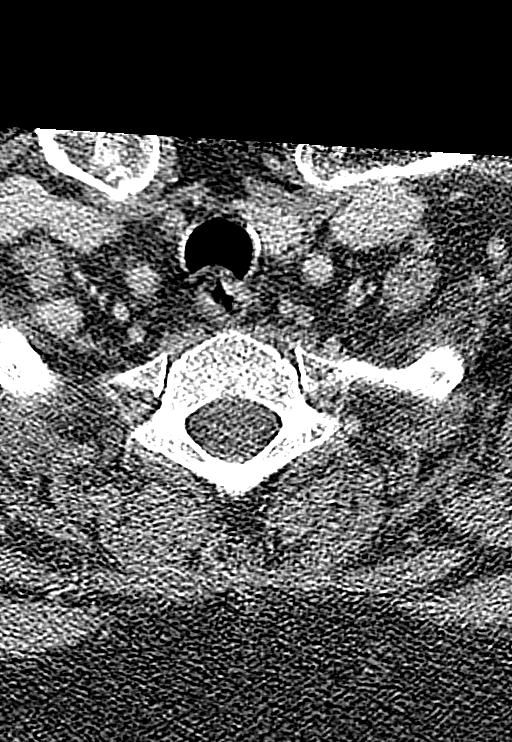
[im 16/96  bone]
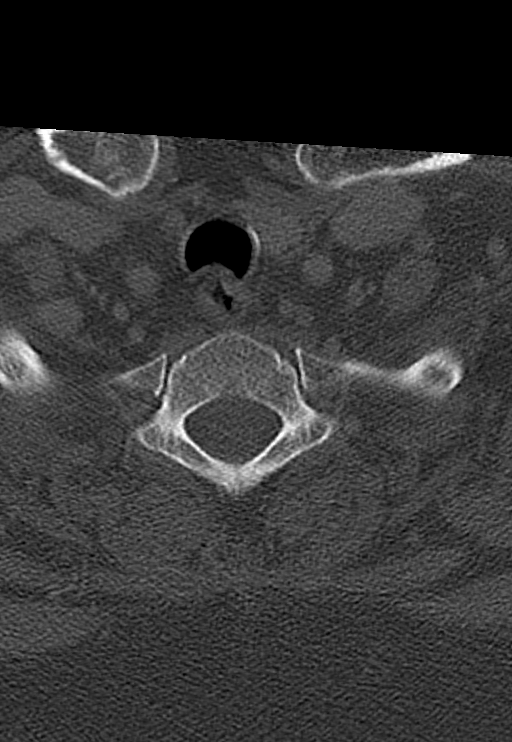
[im 32/96  bone]
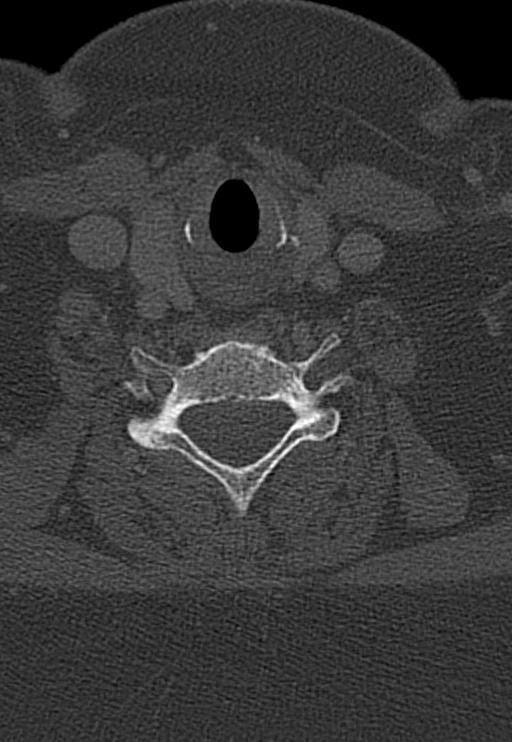
[im 64/96  bone]
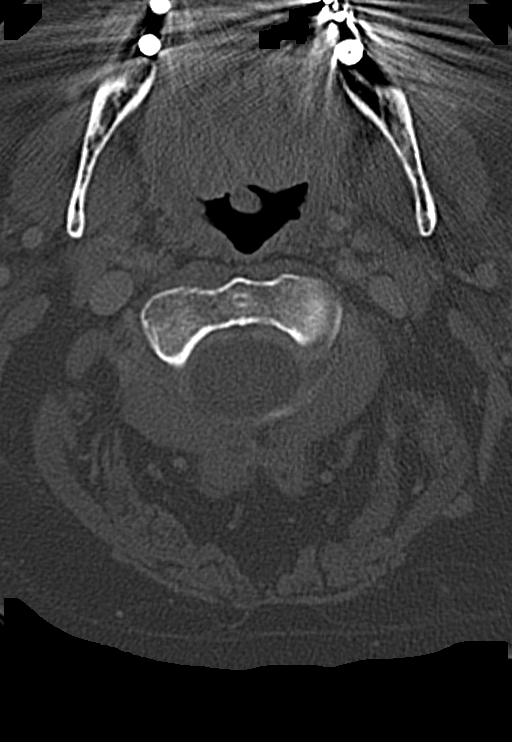
[im 80/96  bone]
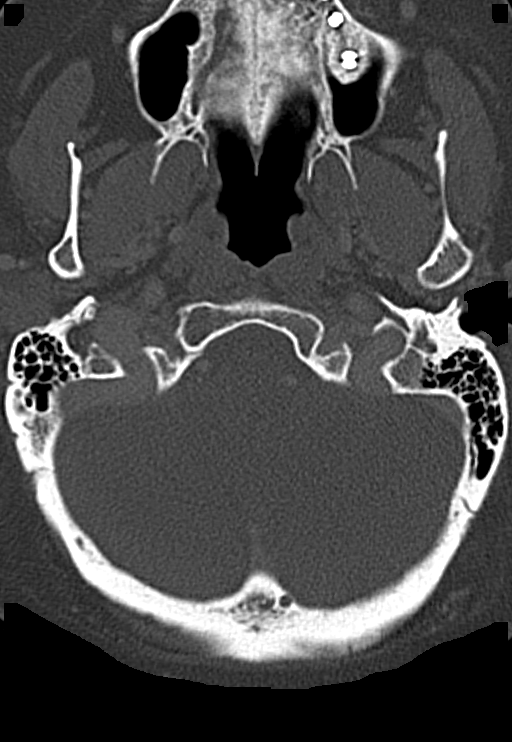

[12 of 33 positions shown; findings below may reference images not displayed]

FINDINGS: CT CERVICAL SPINE FINDINGS

Alignment: Straightening and kyphotic curvature in the cervical
region. 2 mm anterolisthesis C5-6.

Skull base and vertebrae: Few small sclerotic foci, most notable
centrally at the C2 level. No lytic C2 lesion.

C3:  Lytic lesion at the left vertebral body pedicle junction.

C4:  No metastasis identified.

C5: Extensive primarily lytic tumor within the vertebral body. No
extraosseous tumor identified.

C6:  Negative

C7:  Negative

Soft tissues and spinal canal: No soft tissue neck lesion seen.

Disc levels: Degenerative facet arthropathy on the right at C2-3,
C3-4 and C4-5. Degenerative facet arthropathy on the left at C4-5
and C5-6. Spondylosis with small uncovertebral osteophytes at C5-6
and C6-7. No significant stenosis.

CT THORACIC SPINE FINDINGS

Alignment: Minimal spinal curvature.

Vertebrae: T1: Negative.

T2: Extensive lytic disease on the left. Question small amount of
ventral epidural tumor.

T3:  No lesions seen.

T4: Some sclerotic change on the right. No lytic change or
extraosseous tumor.

T5: Sclerotic focus in the left posterior inferior corner. No
extraosseous tumor.

T6: Lytic lesion in the anterior vertebral body. No extraosseous
tumor.

T7: Sclerotic focus on the right side of the vertebral body. No
extraosseous tumor.

T8: Sclerotic focus inferior vertebral body. No extraosseous tumor.

T9: Few small sclerotic foci.  No extraosseous tumor.

T10: Probable metastatic disease inferior vertebral body with
insufficiency fracture of the inferior endplate. No extraosseous
tumor.

T11: Extensive lytic tumor on the left with insufficiency fracture
of the superior endplate. No canal compromise identified. Cannot
rule laterally epidural tumor.

T12: Extensive lytic and sclerotic metastatic disease within the
vertebral body. Cannot rule laterally epidural tumor.

Lytic sclerotic metastatic disease within L1 and L2 as described on
the previous lumbar study.

Paraspinal and other soft tissues: No paraspinal mass measurable.

Disc levels: No significant degenerative disease.
IMPRESSION: Cervical spine: Lytic disease at C3 and C5 but without fracture or
extraosseous tumor at this time.

Thoracic spine: Extensive metastatic disease with most prominent
lytic findings at T2, T6, T10, T11 and T12. Inferior endplate
fracture at T10 and superior endplate fracture at T11 probably due
to underlying metastatic disease.

No convincing canal compromise by tumor on this CT study. Early
ventral epidural tumor not excluded at T11 and T12 and also at T2.
